# Patient Record
Sex: Female | Born: 1935 | Race: White | Hispanic: No
Health system: Midwestern US, Community
[De-identification: ages and names within clinical notes are randomized; demographics above are authoritative.]

## PROBLEM LIST (undated history)

## (undated) DIAGNOSIS — R296 Repeated falls: Secondary | ICD-10-CM

## (undated) DIAGNOSIS — E785 Hyperlipidemia, unspecified: Secondary | ICD-10-CM

## (undated) DIAGNOSIS — T4145XA Adverse effect of unspecified anesthetic, initial encounter: Secondary | ICD-10-CM

## (undated) DIAGNOSIS — M8580 Other specified disorders of bone density and structure, unspecified site: Secondary | ICD-10-CM

## (undated) DIAGNOSIS — R112 Nausea with vomiting, unspecified: Secondary | ICD-10-CM

## (undated) DIAGNOSIS — E559 Vitamin D deficiency, unspecified: Secondary | ICD-10-CM

## (undated) DIAGNOSIS — E119 Type 2 diabetes mellitus without complications: Secondary | ICD-10-CM

## (undated) DIAGNOSIS — E7800 Pure hypercholesterolemia, unspecified: Secondary | ICD-10-CM

## (undated) DIAGNOSIS — T8859XA Other complications of anesthesia, initial encounter: Secondary | ICD-10-CM

## (undated) DIAGNOSIS — Z9889 Other specified postprocedural states: Secondary | ICD-10-CM

## (undated) DIAGNOSIS — I2510 Atherosclerotic heart disease of native coronary artery without angina pectoris: Secondary | ICD-10-CM

## (undated) DIAGNOSIS — K5790 Diverticulosis of intestine, part unspecified, without perforation or abscess without bleeding: Secondary | ICD-10-CM

## (undated) DIAGNOSIS — R609 Edema, unspecified: Secondary | ICD-10-CM

## (undated) DIAGNOSIS — M810 Age-related osteoporosis without current pathological fracture: Secondary | ICD-10-CM

## (undated) DIAGNOSIS — M5431 Sciatica, right side: Secondary | ICD-10-CM

## (undated) DIAGNOSIS — Z1231 Encounter for screening mammogram for malignant neoplasm of breast: Secondary | ICD-10-CM

## (undated) DIAGNOSIS — R600 Localized edema: Secondary | ICD-10-CM

## (undated) DIAGNOSIS — R410 Disorientation, unspecified: Secondary | ICD-10-CM

## (undated) DIAGNOSIS — R270 Ataxia, unspecified: Secondary | ICD-10-CM

## (undated) DIAGNOSIS — I82542 Chronic embolism and thrombosis of left tibial vein: Secondary | ICD-10-CM

## (undated) DIAGNOSIS — M0609 Rheumatoid arthritis without rheumatoid factor, multiple sites: Secondary | ICD-10-CM

## (undated) DIAGNOSIS — M25551 Pain in right hip: Secondary | ICD-10-CM

## (undated) DIAGNOSIS — R509 Fever, unspecified: Secondary | ICD-10-CM

## (undated) HISTORY — DX: Hyperlipidemia, unspecified: E78.5

## (undated) HISTORY — PX: OTHER SURGICAL HISTORY: SHX169

## (undated) HISTORY — DX: Pure hypercholesterolemia, unspecified: E78.00

## (undated) HISTORY — PX: APPENDECTOMY: SHX54

## (undated) HISTORY — DX: Diverticulosis of intestine, part unspecified, without perforation or abscess without bleeding: K57.90

## (undated) HISTORY — DX: Vitamin D deficiency, unspecified: E55.9

## (undated) HISTORY — DX: Atherosclerotic heart disease of native coronary artery without angina pectoris: I25.10

## (undated) HISTORY — DX: Other specified disorders of bone density and structure, unspecified site: M85.80

## (undated) HISTORY — DX: Type 2 diabetes mellitus without complications: E11.9

## (undated) HISTORY — PX: CORONARY ARTERY BYPASS GRAFT: SHX141

## (undated) SURGERY — HEMIARTHROPLASTY, HIP, DIRECT ANTERIOR APPROACH, FOR FRACTURE
Anesthesia: General | Laterality: Right

---

## 1998-04-16 NOTE — Op Note (Signed)
[facility]                                OPERATION REPORT   NAME:     [name] [name], [name] K.   MR#:      [id number]                          DATE:   SS#                                         PT. LOCATION:   LOUIS CARIDEO, [name]   cc:   LOUIS CARIDEO, [name]   INDICATIONS:   Screening procedure.   PROCEDURE: REPORT:   The flexible sigmoidoscope was advanced  to  55  cm. The prep was less than   optimal, but with lavage we were able  to clean it out so that we were well   visualized up to about 55. It was not 100%; I would say, 90% visualization.   There  were  scattered  diverticula above  30  cm,  but  she  had  internal   hemorrhoids noted in the canal which  were  abraded but not bleeding. There   were  no  external  hemorrhoids.  There   were  no  polyps,  tumor  masses,   telangiectasias,  angiodysplasias or  pseudomembranes.  She  tolerated  the   procedure well.   DIAGNOSTIC IMPRESSION:   1.  Diverticulosis.   2.  Internal hemorrhoids.   3.  Otherwise normal flexible sigmoid to 55 cm.   ___________________________   LOUIS CARIDEO, [name]   vmt  D:    T:  04/28/1998  6:59 A

## 1998-04-28 NOTE — Op Note (Signed)
[facility]                                OPERATION REPORT   NAME:     ADELI, FROST   MR#:      [id number]                          DATE:   SS#                                         PT. LOCATION:   Gracy Racer, [name]   cc:   Gracy Racer, [name]   INDICATIONS:   Screening procedure.   PROCEDURE: REPORT:   The flexible sigmoidoscope was advanced  to  55  cm. The prep was less than   optimal, but with lavage we were able  to clean it out so that we were well   visualized up to about 55. It was not 100%; I would say, 90% visualization.   There  were  scattered  diverticula above  30  cm,  but  she  had  internal   hemorrhoids noted in the canal which  were  abraded but not bleeding. There   were  no  external  hemorrhoids.  There   were  no  polyps,  tumor  masses,   telangiectasias,  angiodysplasias or  pseudomembranes.  She  tolerated  the   procedure well.   DIAGNOSTIC IMPRESSION:   1.  Diverticulosis.   2.  Internal hemorrhoids.   3.  Otherwise normal flexible sigmoid to 55 cm.   ___________________________   Gracy Racer, [name]   vmt  D:    T:  04/28/1998  6:59 A

## 1998-10-17 ENCOUNTER — Inpatient Hospital Stay (HOSPITAL_COMMUNITY): Admission: EM | Admit: 1998-10-17 | Discharge: 1998-10-31 | Payer: Self-pay | Admitting: Emergency Medicine

## 1998-10-17 ENCOUNTER — Encounter: Payer: Self-pay | Admitting: Emergency Medicine

## 1998-10-20 ENCOUNTER — Encounter: Payer: Self-pay | Admitting: Interventional Cardiology

## 1998-10-21 ENCOUNTER — Encounter: Payer: Self-pay | Admitting: Interventional Cardiology

## 1998-10-21 ENCOUNTER — Encounter: Payer: Self-pay | Admitting: Surgery

## 1998-10-22 ENCOUNTER — Encounter: Payer: Self-pay | Admitting: Surgery

## 1998-10-24 ENCOUNTER — Encounter: Payer: Self-pay | Admitting: Surgery

## 1998-10-26 ENCOUNTER — Encounter: Payer: Self-pay | Admitting: Surgery

## 1998-12-31 ENCOUNTER — Encounter (HOSPITAL_COMMUNITY): Admission: RE | Admit: 1998-12-31 | Discharge: 1999-03-31 | Payer: Self-pay | Admitting: Interventional Cardiology

## 1999-01-17 ENCOUNTER — Encounter: Payer: Self-pay | Admitting: Family Medicine

## 1999-01-17 ENCOUNTER — Encounter: Admission: RE | Admit: 1999-01-17 | Discharge: 1999-01-17 | Payer: Self-pay | Admitting: Family Medicine

## 2000-09-01 ENCOUNTER — Other Ambulatory Visit: Admission: RE | Admit: 2000-09-01 | Discharge: 2000-09-01 | Payer: Self-pay | Admitting: Family Medicine

## 2000-10-18 ENCOUNTER — Encounter: Admission: RE | Admit: 2000-10-18 | Discharge: 2000-10-18 | Payer: Self-pay | Admitting: Family Medicine

## 2000-10-18 ENCOUNTER — Encounter: Payer: Self-pay | Admitting: Family Medicine

## 2001-01-21 ENCOUNTER — Encounter: Admission: RE | Admit: 2001-01-21 | Discharge: 2001-01-21 | Payer: Self-pay | Admitting: Family Medicine

## 2001-01-21 ENCOUNTER — Encounter: Payer: Self-pay | Admitting: Family Medicine

## 2002-07-06 NOTE — Op Note (Signed)
[facility]                                OPERATION REPORT   [name] [name], [name] [name]   E:   MR  [id number]                         DATE:            07/06/2002   #:   [name]  [id number]                      PT. LOCATION:    [id number]   #   Raynelle Jan, [name], [name]          Raynelle Jan, [name]   PROCEDURE:  Colonoscopy into the terminal ileum.   INDICATIONS:  A 69 year old, white female fro screening colonoscopy.   INSTRUMENT: Olympus CF-160L video colonoscope.   OPERATOR:  Dr. Adelene Idler.   ASSISTANT:  Lolita Acompanado.   PREMEDICATION:  Demerol 50 mg, Versed 2.5 mg intravenously.   DESCRIPTION OF PROCEDURE:  The Olympus video colonoscope was introduced   into the rectum and was advanced into the cecum without difficulty.  There   was clear visualization of the cecal base and ileocecal valve area.  The   scope was passed through the ileocecal valve into the terminal ileum for a   distance of 10 to 15 cm.   FINDINGS:  Several diverticula were noted throughout the sigmoid colon.   There was no endoscopic evidence of diverticulitis.  Otherwise the entire   colon and visualized portions of the terminal ileum appeared normal with   normal mucosa, normal vascular pattern and without evidence of   inflammation, ulceration, masses, polyps or other abnormalities.  Prominent   hemorrhoids were observed by retroflexing the scope in the rectum.  At this   point, the scope was withdrawn and the patient tolerated the procedure well   and was returned to the outpatient area in satisfactory condition.   IMPRESSION:   1.  Large internal hemorrhoids.   2.  Diverticulosis of the sigmoid colon.   3.  Otherwise normal colonoscopy with normal terminal ileum.   Electronically Signed By:   Raynelle Jan, [name] 07/13/2002 14:32   _________________________________   Raynelle Jan, [name]   le  D:  07/06/2002  T:  07/10/2002  3:13 [id number]

## 2002-07-06 NOTE — Op Note (Signed)
[facility]                                OPERATION REPORT   NAM [name] [name], [name] [name]   E:   MR  [id number]                         DATE:            07/06/2002   #:   SS  [id number]                      PT. LOCATION:    [id number]   #   ALAN J. GAMSEY, [name]   cc:    LOUIS CARIDEO, [name]          ALAN J. GAMSEY, [name]   PROCEDURE:  Colonoscopy into the terminal ileum.   INDICATIONS:  A 69-year-old, white female fro screening colonoscopy.   INSTRUMENT: Olympus CF-160L video colonoscope.   OPERATOR:  Dr. Gamsey.   ASSISTANT:  Lolita Acompanado.   PREMEDICATION:  Demerol 50 mg, Versed 2.5 mg intravenously.   DESCRIPTION OF PROCEDURE:  The Olympus video colonoscope was introduced   into the rectum and was advanced into the cecum without difficulty.  There   was clear visualization of the cecal base and ileocecal valve area.  The   scope was passed through the ileocecal valve into the terminal ileum for a   distance of 10 to 15 cm.   FINDINGS:  Several diverticula were noted throughout the sigmoid colon.   There was no endoscopic evidence of diverticulitis.  Otherwise the entire   colon and visualized portions of the terminal ileum appeared normal with   normal mucosa, normal vascular pattern and without evidence of   inflammation, ulceration, masses, polyps or other abnormalities.  Prominent   hemorrhoids were observed by retroflexing the scope in the rectum.  At this   point, the scope was withdrawn and the patient tolerated the procedure well   and was returned to the outpatient area in satisfactory condition.   IMPRESSION:   1.  Large internal hemorrhoids.   2.  Diverticulosis of the sigmoid colon.   3.  Otherwise normal colonoscopy with normal terminal ileum.   Electronically Signed By:   ALAN J. GAMSEY, [name] 07/13/2002 14:32   _________________________________   ALAN J. GAMSEY, [name]   le  D:  07/06/2002  T:  07/10/2002  3:13 [id number]

## 2003-02-14 NOTE — H&P (Signed)
[facility]                              HISTORY AND PHYSICAL                               Gracy Racer, [name]   NAME:    [name] [name], [name] [name]   MR #:    [id number]                    ADM DATE:        02/14/2003   BILLING  [id number]                   PT. LOCATION     [id number]   #:   SS #     [id number]   Gracy Racer, [name], [name]   CHIEF COMPLAINT:    I'm blocked.   HISTORY OF PRESENT ILLNESS:   This very pleasant 69 year old white female   is admitted to the hospital with 6 days of inability to move her bowels.   Her abdomen is bloated. She has nauseated feeling.  She has lower abdominal   cramps.  In my office, she had lower abdominal tenderness and a rectal   vault that was clean without stool.  Although not vomiting and not having   diarrhea, it was my impression that she had at least a partial small bowel   obstruction and she was admitted to the hospital for IV fluids, surgical   and gastrointestinal consultation.   PAST MEDICAL HISTORY:      1. Hypertension.      2. Hypothyroidism.      3. Radioactive iodine thyroid ablation.      4. Hemochromatosis, treated with phlebotomy.   SOCIAL HISTORY:   She does not smoke, she does drink wine occasionally.   FAMILY HISTORY:    Mother and father are deceased, cause of their deaths   not clear.   REVIEW OF SYSTEMS:   CONSTITUTIONAL:  Reveals stable weight.   PULMONARY:  Reveals no wheezing or coughing.   CARDIAC:  Reveals no chest pain.   GENITOURINARY:  Reveals no dysuria or hematuria.   ALLERGIES:   Sulfa   PHYSICAL EXAMINATION:   GENERAL:  Shows a white female who is pleasant, conversant, complaining of   lower abdominal cramping.   VITAL SIGNS:  Blood pressure 130/80, pulse 70, respiratory rate 16.   HEAD AND EYES:  Pupils equal, round, reactive to light and accommodation.   Conjunctiva pink.  Lids flat.  Throat not injected.   NECK:  Symmetric, supple, thyroid not enlarged or tender.  Trachea   midline.    LUNGS:  Clear to auscultation and percussion.   CARDIAC:  Shows PMI nondisplaced.  There is a midsystolic murmur   crescendo/decrescendo, grade 2/6 at the aortic area and left sternal   border.   BREASTS:  Without mass or discharge.   ABDOMEN:  Protuberant, somewhat tympanic with lower abdominal tenderness.   No organomegaly.  Bowel sounds diminished.   RECTAL:  Hematest negative.  Stool is not present.  There is no stool in   the rectal vault at all.   EXTREMITIES:  No clubbing, cyanosis or edema.   SKIN:  Warm and dry without rashes or nodules.   NEUROLOGICAL:  Exam shows she  moves all extremities, symmetrical reflexes,   normal sensation.   DIAGNOSTIC IMPRESSION:      1. Abdominal pain and bowel obstruction.      2. Hemochromatosis.      3. Hypertension.      4. Hypothyroidism.   ____________________________   Gracy Racer, [name]   Italica.Scarce  D:  02/14/2003  T:  02/14/2003  5:48 P   [id number]

## 2003-02-15 NOTE — Consults (Signed)
[facility]                               CONSULTATION REPORT                       CONSULTANT:  Robby Sermon, [name]   NAME:       [name] [name]   BILLING #:  [id number]             DATE OF CONSULT:     02/15/2003   MR #:       [id number]              ADM DATE:            02/14/2003   SS #        [id number]           PT. LOCATION:        [id number]   ATTENDING:  Gracy Racer, [name], [name]          Robby Sermon, [name]   REASON FOR CONSULTATION:  Acute obstipation and constipation.   HISTORY OF PRESENT ILLNESS:  [name] [name] is a 69 year old white woman well   known to Dr. Roma Schanz for underlying genetic hemochromatosis.  She was   in her usual state of health until about Wednesday of last week when she   began to experience fatigue, general body aching and malaise and a fever   sensation.  She felt like she was coming down with the flu.  Last Saturday,   she began to experience severe bouts of urgency, tenesmus and frequency   with passage of pencil thin stools.  She had to use enemas and laxatives   with minimal relief.  She states her last normal bowel movement was 8 days   ago.  Since then she has been complaining of rather severe bloating and low   abdominal colicky cramping and pain.  She denies any recent stress.  She   denies any recent trauma.  She denies vomiting or nausea.   The patient saw Dr. Billy Coast yesterday.  Dr. Billy Coast suspect a bowel   obstruction.  She was admitted.  Initial x-rays showed a nonspecific gas   pattern.  The patient was seen by Dr. Metro Kung who ordered repeat   x-rays, that showed improvement of gas pattern.  A nasogastric tube was   placed for decompression.  A GI evaluation has been requested by Dr.   Billy Coast.   PAST MEDICAL HISTORY:  Genetic hemochromatosis status post liver biopsy   August 2004.  The patient requiring weekly phlebotomy.  History of graves    disease status post thyroidectomy with radioactive iodine.   PAST SURGICAL HISTORY:  Total abdominal hysterectomy, bilateral   salpingo-oophorectomy, Cesarean section x3, kidney stone removal.   FAMILY HISTORY:  Mother died at age 21 of renal failure.  Father died at   age 86 of lung cancer.  No family history of liver disease or   hemochromatosis.  No family history of colon cancer or stomach cancer.   PERSONAL, SOCIAL HISTORY:  The patient is a homemaker.  She is married.   She has 3 children.  She has 6 grandchildren.  She does not smoke   cigarettes or abuse alcohol.   ALLERGIES:  Sulfa.   MEDICATIONS:  Synthroid, Zestril, Chloraseptic spray, Ativan, Ambien.  OBJECTIVE:  Well developed and well nourished.  She was awake, alert,   oriented.   VITAL SIGNS:  Temperature 99.7, pulse rate 80, blood pressure 127/76,   respirations 20.   HEAD, EYES, EARS, NOSE AND THROAT:  Anicteric sclerae and pink   conjunctivae.  Moist oral mucosa without thrush or telangiectasias.   NECK:  Supple without jugular venous distention or carotid bruits.   CHEST:  Clear to auscultation and percussion.   HEART:  Regular rhythm, no evidence of gallops, murmurs, rubs.   ABDOMEN:  Distended with rather hyperactive bowel sounds.  Multiple healed   abdominal scars.  Tympanitic all over.  No definite tenderness, rebound or   guarding.  No Murphy's sign.  No hernias.   RECTAL EXAMINATION:  Empty rectal vault.  No palpable mass.  Stool brown.   Good sphincter tone.   EXTREMITIES:  No clubbing, cyanosis, or edema.   PERTINENT LABORATORY RESULTS:      1. Sodium 142, potassium 3.9, BUN 7, creatinine 0.7, glucose 115, total      bilirubin 0.3, AST 19, ALT 30, alkaline phosphatase 69, amylase 26.      2. Hemoglobin 9.2, hematocrit 26.5, WBC 7.7, platelet count 419, MCV      19.9.   IMPRESSION:      1. Acute constipation and obstipation with accompanying bloating.  Plain      abdominal x-rays unremarkable for a bowel obstruction.  Rectal       examination without mass nor fecal impaction.  I suspect either the      possibility of an atypical small bowel obstruction or large bowel      obstruction, high fecal impaction, colonic stricture or functional bowel      disease.      2. Sigmoid diverticulosis.      3. Hemochromatosis requiring weekly phlebotomy.      4. Hypothyroidism.      5. Hypertension.      6. Anemia probably related to weekly phlebotomy.   PLAN:      1. Schedule a CAT scan of the abdomen and pelvis with contrast.      2. Maintain nasogastric tube suction.      3. Administer tap water enemas every morning.      4. Obtain a serum TSH level.      5. If CAT scan negative, suggest either flexible sigmoidoscopy combined      with single contrast barium enema versus a repeat colonoscopy.   Electronically Signed By:   Robby Sermon, [name] 02/21/2003 17:05   _________________________________   Robby Sermon, [name]   Xaver.Mink  D:  02/15/2003  T:  02/16/2003  3:16 P   [id number]

## 2003-02-18 NOTE — Consults (Signed)
[facility]                               CONSULTATION REPORT                       CONSULTANT:  Donah Driver, [name]   NAME:       [name] [name]   BILLING #:  [id number]             DATE OF CONSULT:     02/18/2003   MR #:       [id number]              ADM DATE:            02/14/2003   SS #        [id number]           PT. LOCATION:        [id number]   ATTENDING:  Gracy Racer, [name], [name], [name]   REASON FOR CONSULTATION:  Gross hematuria.   NARRATIVE SUMMARY:  This is a pleasant 69 year old white female admitted   with complicated sigmoid diverticulosis with abscess.  After appropriate IV   antibiotics were started the patient acutely has had episode of urinary or   vaginal bleeding today.  A Foley catheter was placed and the patient had   grossly clear urine.  The patient is status post total abdominal   hysterectomy and bilateral salpingo-oophorectomy in the remote past.  The   patient however, continues to have bleeding and this appears to be   vaginally.   The patient's past medical history is pertinent for hypertension,   hyperthyroidism,  mammograms and pap smears have been normal, she had a pap   smear a year and a half ago.  She had pyelolithotomy in 1955, hysterectomy   in 1961, thyroidectomy in 1956.  She has had 3 cesarean sections and 1   breast biopsy that was negative.   She is allergic to sulfa.   Her medications include Synthroid, hydrochlorothiazide, lisinopril, calcium   citrate, glucosamine and chondroitin, Tylenol.   SOCIAL HISTORY, FAMILY HISTORY, REVIEW OF SYSTEMS:  Were noted as per   dictated history and physical from  Dr. Billy Coast and consult patient notes.   PHYSICAL EXAMINATION:   GENERAL:  She has no costovertebral angle tenderness.   VITAL SIGNS:  She is febrile at 101 degrees Fahrenheit.   ABDOMEN:  Soft, nontender, there is slight periumbilical pain but nothing   to palpation.    GYN:  There is normal urethra with Foley catheter in place and no bleeding   around the urethra.  There is thin blood in the vagina.  There is a   pronounced vaginal cuff, I question if this is inflammation as it has been   so long since her hysterectomy.   EXTREMITIES:  Without edema or tenderness.   The urine is grossly clear.  Urinalysis shows 5 to 9 RBC per high power   field with negative leukocyte esterase and negative on micro.  Sodium 140,   potassium 3.9, creatinine 0.7, hemoglobin, hematocrit 10 and 30, white   blood count 10.3.   ASSESSMENT:  Pelvic bleeding, appears vaginal in nature but with a   diverticular history, certainly need to rule out potential colovesical   fistula  or colovaginal fistula.   PLAN:  I agree with repeat CT and CT cystogram can be obtained at the same   time.  Also ? GYN consult if there is no fistula documented.  Certainly the   patient will benefit from cystoscopy when she is clinically improved.   Right now she appears febrile and does have further issues.   Electronically Signed By:   Donah Driver, [name] 03/08/2003 18:47   _________________________________   Donah Driver, [name]   Davonna Belling  D:  02/18/2003  T:  02/19/2003  2:04 P   [id number]

## 2003-02-19 NOTE — Consults (Signed)
[facility]                               CONSULTATION REPORT                        CONSULTANT:  Milinda Antis, [name]   NAME:       [name] [name]   BILLING #:  [id number]             DATE OF CONSULT:     02/19/2003   MR #:       [id number]              ADM DATE:            02/14/2003   SS #        [id number]           PT. LOCATION:        [id number]   ATTENDING:  Gracy Racer, [name], [name]          Milinda Antis, [name]   HISTORY OF PRESENT ILLNESS:  This is a 69 year old white female gravida 4,   para 3, 013, last menstrual period with total abdominal hysterectomy at 69   years of age, which is approximately 30 years ago for questionable   abnormality of her cervix for what the patient states was some type of   cancer or precancer.  The patient did not have radiation or chemotherapy   following the surgery.  The patient has a history of spotting for the past   2 days while admitted in the hospital for constipation and acute   diverticulitis.  The patient also states when she was bleeding yesterday   she noticed a clot came out.  Today she is doing better.  A total of 5 pads   used in the past 2 days.  She has been admitted for approximately 5 days at   this point for constipation and acute possible diverticular abscess.   PAST MEDICAL HISTORY:  Significant for hemochromatosis where she had blood   drawn off prior to admission, hemoglobin running at 9 to 10.  The patient   also has a history of increased blood pressure.   PAST SURGICAL HISTORY:  Total abdominal hysterectomy, bilateral   salpingo-oophorectomy with the ovaries being removed on 2 occasions.   Thyroid surgery x 2 with also radioactive thyroid being used.  Kidney stone   history, cesarean section x 3.   PAST GYN HISTORY:  Cesarean section x 3, negative hormone replacement   therapy x 3 years.   SOCIAL HISTORY:   She is married, retired Chartered loss adjuster.  Denies smoking,   denies excess alcohol.    ALLERGIES:  Sulfa.   MEDICATIONS:  Currently are Synthroid and lisinopril.   FAMILY HISTORY:   Lung cancer.   REVIEW OF SYSTEMS:  Significant for abnormal Pap smears, status post 30   years and no hormones for the past 3 years.   PHYSICAL EXAMINATION:  Confined to the GYN area.  The vagina appears   grossly normal.  The external genitalia - BUS appears normal.  Introitus at   the vaginal opening has the appearance of atrophy.  The vaginal mucosa   itself is also atrophic.  A speculum was placed and extended into the   vagina.  It was noted the apex of the vagina  appeared to be drawn up by   traction, could not see any definitive lesions at the apex.  A swab had   some slight minimal pinkish tint to it after wiping the area.  A Pap smear   was done to assure there was no obvious cervical cytology of the cuff.   Bimanual examination shows the apex of the vagina is firmly attached in the   pelvis.  Questionably this could be secondary to the diverticular disease   versus some other etiology.   ASSESSMENT:      1. Post menopausal bleeding.      2. Atrophic vaginitis.      3. 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   PLAN:  Pap done.  The patient states she does have a regular gynecologist   she sees and she is planning to see him within the next few days upon   discharge.  She can either continue with her standard gynecologist or   follow up with myself post admission.  We will follow her for the next day   or 2 to assure there is no further bleeding.  If she continues to be in the   hospital then we will try to get records concerning the hysterectomy at age   89.   Thank you very much for the opportunity to do a consultation on this   extremely pleasant woman.   Electronically Signed By:   Milinda Antis, [name] 02/21/2003 10:18   _________________________________    Milinda Antis, [name]   Keltoi.Punches  D:  02/19/2003  T:  02/20/2003  7:14 A   [id number]

## 2003-02-20 NOTE — Procedures (Signed)
[facility]                              CARDIOLOGY DEPARTMENT                              ECHOCARDIOGRAM REPORT   Name: [name] [name], [name] [name]                     Location: 4225        Age: 69   Date: 02/20/2003   Ref Phys: L. Carideo                         MR#: [id number]   Tape/Index:   Reason For Study: Left ventricular ejection fraction; murmur   M-Mode Measurements (Parasternal):                Measured:   Normal:   RV Internal Diastolic Diameter              7 mm                    7-23 mm   Ventricular Septal Thickness                9 mm                    7-11 mm   LV Internal Diastolic Diameter             48 mm                   37-54 mm   LV Posterior Wall Thickness                10 mm                    7-11 mm   LV Internal Systolic Diameter              29 mm   Aortic Root Diameter                       33 mm                   20-37 mm   Aortic Valve Cusp Separation               20 mm                   16-26 mm   Left Atrial Diameter                       31 mm                   19-40 mm   2-Dimensional and Doppler Findings:   INDICATION:  Evaluate cardiac source of murmur.   STUDIES PERFORMED:  2-D surface echocardiogram with qualitative and   quantitative Doppler and M-mode measurements.   FINDINGS:   1.  Technical quality of study is adequate.   2.  Left ventricle appears normal in size and function with normal regional       wall motion.    Ejection fraction is approximately 55-60%.  Right       ventricle appears normal in size and function.  Right and left  atrium       appear normal in size.   3.  Aortic valve is trileaflet with normal leaflet excursion and no       evidence of stenosis or regurgitation.   4.  Mitral valve appears structurally normal.  No evidence of stenosis.       There is trace regurgitation.  There is equalization of e/a wave ratios       with abnormal Doppler tissue imaging consistent with diastolic       dysfunction.   5.  Tricuspid valve  has trace regurgitation with right ventricular systolic       pressure of 34 mmHg.   6.  Pulmonic valve is not well seen.   7.  There is no pericardial effusion.   8.  There is no intracardiac mass, shunt, thrombi or vegetation.   IMPRESSION:  Normal left ventricular systolic function with evidence of   diastolic dysfunction.  Otherwise as above.   __________________________________________________   Roque Cash, [name]   Will.Benes  D: 02/20/2003  T: 02/20/2003  5:36 P    [id number]

## 2003-02-20 NOTE — Discharge Summary (Signed)
[facility]                                DISCHARGE SUMMARY   NAM AKANKSHA, BELLMORE   E:   MR  [id number]                          ADM DATE:      02/14/2003   #:   [name]  [id number]                       DIS DATE:      02/20/2003   #   Gracy Racer, [name], [name]   SUMMARY OF HISTORY:  This 69 year old white female was admitted to the   hospital with six days of inability to move her bowels, bloating, nausea,   and lower abdominal pain.   PAST MEDICAL HISTORY:   1.     Hypertension.   2.     Hypothyroidism.   3.     Radioactive iodine thyroid ablation.   4.     Hemochromatosis, treated with phlebotomy.   PHYSICAL EXAMINATION:  See History of the Present Illness.   HOSPITAL COURSE AND COMPLICATIONS:  Diverticulitis.  This patient was   admitted to the hospital with abdominal pain.  She was seen by   Gastroenterology and by Surgery.  She had a CAT scan which suggested a   diverticular abscess.  This was treated with IV antibiotics, specifically   Levaquin and metronidazole.  These were given IV.  The pain improved.  It   was thought that she might need a diverticular resection in the future;   however, we decided to go ahead and give her a significant course of IV   antibiotics and stabilize her.  Will re-admit her for potential surgery at   a future time.  At the time of discharge.  Her pain has subsided.  She is   moving her bowels and felt better.   LABORATORY/DIAGNOSTIC DATA:  Labs showed sodium 142, potassium 3.9,   chloride 103, Co2 29, glucose 115, BUN 7, creatinine 0.7.  White count   10,312; repeat 5100.  Urinalysis showed negative leukocyte esterase,   moderate blood.  Urine culture showed no growth.  TSH was 5.4 which was   slightly high.  Initial hemoglobin 9.2.  Iron was 195, TIBC was 225,   838, folic acid 14.1.   DISCHARGE DIAGNOSES:   1.     Diverticulitis with abscess.   2.     Hypertension.   3.     Hypothyroidism.   4.     Hemochromatosis.    DISCHARGE MEDICATIONS:   1.     Levaquin 500 mg daily.   2.     Flagyl 500 mg IV q.6 hours to complete 14 days.   3.     Lisinopril 10 mg daily.   4.     Synthroid 0.05 mg daily.   ________________________________   Gracy Racer, [name]   [place].Birch  D:  03/12/2003  T:  03/14/2003  6:49 A   [id number]

## 2003-02-20 NOTE — Procedures (Signed)
[facility]                              CARDIOLOGY DEPARTMENT                              ECHOCARDIOGRAM REPORT   Name: [name] [name], [name] [name]                     Location: 4225        Age: 69   Date: 02/20/2003   Ref Phys: L. Carideo                         MR#: [id number]   Tape/Index:   Reason For Study: Left ventricular ejection fraction; murmur   M-Mode Measurements (Parasternal):                Measured:   Normal:   RV Internal Diastolic Diameter              7 mm                    7-23 mm   Ventricular Septal Thickness                9 mm                    7-11 mm   LV Internal Diastolic Diameter             48 mm                   37-54 mm   LV Posterior Wall Thickness                10 mm                    7-11 mm   LV Internal Systolic Diameter              29 mm   Aortic Root Diameter                       33 mm                   20-37 mm   Aortic Valve Cusp Separation               20 mm                   16-26 mm   Left Atrial Diameter                       31 mm                   19-40 mm   2-Dimensional and Doppler Findings:   INDICATION:  Evaluate cardiac source of murmur.   STUDIES PERFORMED:  2-D surface echocardiogram with qualitative and   quantitative Doppler and M-mode measurements.   FINDINGS:   1.  Technical quality of study is adequate.   2.  Left ventricle appears normal in size and function with normal regional       wall motion.    Ejection fraction is approximately 55-60%.  Right       ventricle appears normal in size and function.  Right and left  atrium       appear normal in size.   3.  Aortic valve is trileaflet with normal leaflet excursion and no       evidence of stenosis or regurgitation.   4.  Mitral valve appears structurally normal.  No evidence of stenosis.       There is trace regurgitation.  There is equalization of e/a wave ratios       with abnormal Doppler tissue imaging consistent with diastolic       dysfunction.    5.  Tricuspid valve has trace regurgitation with right ventricular systolic       pressure of 34 mmHg.   6.  Pulmonic valve is not well seen.   7.  There is no pericardial effusion.   8.  There is no intracardiac mass, shunt, thrombi or vegetation.   IMPRESSION:  Normal left ventricular systolic function with evidence of   diastolic dysfunction.  Otherwise as above.   __________________________________________________   Roque Cash, [name]   Will.Benes  D: 02/20/2003  T: 02/20/2003  5:36 P    [id number]

## 2003-04-17 NOTE — H&P (Signed)
[facility]                              HISTORY AND PHYSICAL                               Gracy Racer, [name]   NAME:    [name] [name], [name] [name]   MR #:    [id number]                    ADM DATE:        04/17/2003   BILLING  [id number]                   PT. LOCATION     [id number]   #:   SS #     [id number]   Gracy Racer, [name], [name]   CHIEF COMPLAINT:   Abdominal pain.   HISTORY:  This is a 70 year old white female who had previously been   admitted in December 2004 with diverticulitis.  She was treated   nonoperatively and was discharged with antibiotics.  She was afebrile until   the 29th of January when she had onset of lower abdominal pain.  The   following day she noted some vaginal discharge.  She indicated that her   stool size had decreased, but she had no rectal bleeding.  She was seen by   Dr. Billy Coast, who admitted her on the 1st of February.   PAST MEDICAL HISTORY:  Included previous diverticulitis admission.  She had   a hysterectomy approximately 30 years previous and prior to that had   cesarean sections.   REVIEW OF SYSTEMS:  Noncontributory.   PHYSICAL EXAMINATION:  The patient is alert, oriented and cooperative.   HEENT:  Head is normocephalic, atraumatic.  Eyes pupils equal and respond   to light and accommodation.  Extraocular muscles are intact.  Throat   unremarkable.   NECK:  Supple.  Trachea is midline.  No lymphadenopathy.   CHEST:  Expansion symmetrical.   LUNGS:  Clear to auscultation and percussion.   HEART:  Rate are normal.   ABDOMEN:  Scaphoid.  Some tenderness in the left lower quadrant with no   masses palpable, no rebound.   LABORATORY DATA:  The patient's white count was 9,500 with 50% segs.   Glucose was 150, CO2 31, BUN 3.4.  Other chem values were normal.  CT   suggested sterile abscess in that there was no sign of local inflammation   with fluid accumulation.  The sigmoid is apparently adhesions to the    posterior bladder and to the vaginal cuff.   IMPRESSION:  Residua of diverticulitis.   RECOMMENDATIONS:  Elective sigmoid resection.  Arrange assistance with Dr.   Iran Ouch and Dr. Marden Noble on the 11th of February.   Thanks for the opportunity to see this patient.   Yours sincerely,   ____________________________   Gracy Racer, [name]   md  D:  04/17/2003  T:  04/19/2003  9:55 A   [id number]

## 2003-04-17 NOTE — H&P (Signed)
[facility]                              HISTORY AND PHYSICAL                               Gracy Racer, [name]   NAME:    [name] [name], [name] [name]   MR #:    [id number]                    ADM DATE:        04/17/2003   BILLING  [id number]                   PT. LOCATION     [id number]   #:   SS #     [id number]   Gracy Racer, [name], [name]   CHIEF COMPLAINT:  "My belly hurts."   HISTORY OF PRESENT ILLNESS:   I saw this woman in my office with lower   abdominal cramping, smaller BMs and what she described as mild vaginal   bleeding.  This has been going on for 4 days.  She was discharged from this   hospital on 12/07 after a 6-day hospitalization for complicated   diverticulitis.  She was treated with a prolonged period of antibiotics and   had a CAT scan which showed a diverticular abscess.  She was specifically   treated with Levaquin and metronidazole intravenously and then Augmentin as   an outpatient.  I saw her in the office previous to this recent visit and   she was doing well with no symptomatology.   PAST HISTORY:      1. Hypertension.      2. Hypothyroidism.      3. Radioactive iodine ablation of her thyroid.      4. Hemochromatosis, treated with lobotomy.   SOCIAL HISTORY:  She does not smoke or drink.   ALLERGIES:  Sulfa.   FAMILY HISTORY: Mother and father are deceased.  The causes of the deaths   are not clear.   REVIEW OF SYSTEMS:   CONSTITUTIONAL: No weight gain or weight loss.   CARDIAC:  Review reveals no dyspnea on exertion and no edema.   PULMONARY:  Review reveals no wheezing or coughing.   GENITOURINARY:  No dysuria or hematuria.  She is having some vaginal   bleeding.  She was evaluated by Dr. Marden Noble during the last hospitalization.   She denies symptoms of other systems review.   PHYSICAL EXAMINATION on 04/17/03 shows a white female, pleasant,   conversant, complaining of lower abdominal cramping but not severe.    VITAL SIGNS:  Blood pressure is 140/80, pulse 80, respiratory rate 18 and   temperature is 98.  [name]   HEAD AND EYES:  Head is normocephalic.  Eyes - Pupils equal, round,   reactive to light and accommodation.  Conjunctivae pink.  Lids flat.   Throat not injected.   NECK:  Symmetric, supple, thyroid not enlarged or tender.  Trachea   midline.   LUNGS:  Clear to auscultation and percussion.   CARDIAC:  Shows PMI nondisplaced without gallop.  There is a   crescendo/decrescendo murmur, grade II/VI, at the aortic area and left   sternal border.   BREASTS:  Without mass or discharge.   ABDOMEN:  Protuberant.  There  is suprapubic tenderness which is moderate.   No rebound is noted.   RECTAL:  Hematest negative.  No stool present.   PELVIC:  Deferred.   EXTREMITIES:  Show no clubbing, cyanosis or edema.  Skin is warm and dry   without rashes or nodules.   NEUROLOGIC:  She moves all extremities.  She has symmetric reflexes and   normal sensation.   DIAGNOSTIC IMPRESSIONS:      1. Abdominal pain with previous diverticular abscess.      2. Hemochromatosis.      3. Vaginal bleeding.      4. Hypertension.      5. Hypothyroidism.   ____________________________   Gracy Racer, [name]   lo  D:  04/17/2003  T:  04/18/2003  7:24 A   [id number]

## 2003-04-18 NOTE — Consults (Signed)
[facility]                               CONSULTATION REPORT                        CONSULTANT:  Milinda Antis, [name]   NAME:       [name] [name]   BILLING #:  [id number]             DATE OF CONSULT:     04/18/2003   MR #:       [id number]              ADM DATE:            04/17/2003   SS #        [id number]           PT. LOCATION:        [id number]   ATTENDING:  Gracy Racer, [name], [name]          Milinda Antis, [name]          Dionicia Abler, [name]   HISTORY OF PRESENT ILLNESS:    A 70 year old white gravida 4, para 3, 0-1-3   admitted for abdominal pain with history of diverticulosis noted bleeding   on Sunday and status post 3 days, watery in nature with blood and also   spotting today.   PAST SURGICAL HISTORY:    Hysterectomy 33 years ago, question if there was   some type of a cervical dysplasia or other precancer lesions.   History of   thyroid surgery, BSO and cesarean section x 3.   PAST MEDICAL HISTORY:    Diverticulosis and thyroid disease also   hemochromatosis.   PAST GYN HISTORY:   Menarche at age 16.   Menopause with hysterectomy.   SOCIAL HISTORY:   Married, homemaker.    Denies smoking, drugs or alcohol.   REVIEW OF SYSTEMS:   Previous episode of this in December 2004 when she was   admitted to [facility] and was to followup at her PMD GYN   which she was unable to do so.   PHYSICAL EXAMINATION:    Confined to abdomen and GYN.   ABDOMEN:   Is soft, nontender.   Old midline scar noted.   GYN:   Vaginal examination is a firm area at the apex at the vagina.   Negative palpable adnexal masses.   No obvious blood on the glove.   ASSESSMENT:   Postmenopausal bleeding, possible diverticulosis at the apex   and the vaginal cuff her previous admission.   PLAN:   Obtain old records from previous GYN.   If Dr. Jarold Motto is   considering doing surgery, will be happy to scrub in to assess the aspect    of the cuff involvement.   Otherwise will continue to follow the patient.   Again, thank you for the consultation and it is a pleasure to work with   you.   Sincerely yours,   Electronically Signed By:   Milinda Antis, [name] 05/05/2003 11:43   _________________________________   Milinda Antis, [name]   eb  D:  04/18/2003  T:  04/18/2003  2:43 P   [id number]

## 2003-04-27 NOTE — Op Note (Signed)
[facility]                                OPERATION REPORT                       SURGEON:  Dionicia Abler, [name]   [facility] [place]   E:   MR  [id number]                         DATE:            04/27/2003   #:   [name] [name]  [id number]                      PT. LOCATION:    [id number]   #   Dionicia Abler, [name]   cc:    Dionicia Abler, [name]   PREOPERATIVE DIAGNOSIS:   Diverticulitis of the sigmoid.   POSTOPERATIVE DIAGNOSIS:   Diverticulitis of the sigmoid.   PROCEDURE:   Sigmoid resection and repair of bladder tear.   SURGEON:   Dr. Jarold Motto.   1ST ASSISTANT:   Dr. Iran Ouch.   DESCRIPTION OF PROCEDURE:  The patient was placed on the table in the   supine position after epidural had been inserted. General anesthesia was   administered. The patient then had compression stockings applied and the   legs were placed in extension stirrup. The abdomen was prepped and draped.   Vertical incision was made through a previous operative site. Adhesions to   the anterior abdominal wall were lysed. A wound protector was inserted and   the abdomen was examined. A segment of sigmoid measuring approximately   15-cm in length was indurated and adherent to the vaginal cuff and the   posterior aspect of the bladder. Packs and retractors were placed. The   white line of Toldt was incised left laterally and sigmoid rotated   medially. The left ureter was identified. The undersurface of the adherent   sigmoid loop was not visible so it was freed from the vaginal cuff and the   bladder with digital blunt dissection. After the loop had been freed and   brought into the operative area, it became apparent that the bladder had   been opened during the blunt dissection. This was repaired in three layers   using 3-0 chromic for the mucosa, then a double layer closure of the   muscular bladder using 2-0 chromic.   Attention was then turned to the sigmoid. The bowel was freed   circumferentially approximately 4-cm proximal to  the inflammatory mass. The   GIA was placed and fired transecting the bowel and sealing both ends. Right   angle clamps were placed distally and the bowel distal was transected. A   Ligasure was used to transect the mesosigmoid. The specimen was passed from   the field. The bowel ends could be approximated without tension. A   non-crushing bowel clamp was placed across the descending colon. Colotomy   was performed on the antimesenteric surface and a single layer anastomosis   using Vicryl was carried out. On the posterior surface end sutures and two   mattress sutures were placed and then the intervals were closed with single   layer simple sutures. The anterior was closed in similar manner and the   non-crushing bowel clamp was removed. There was  a single area of leakage,   which was closed with suture. Then fatty tissues were sutured across the   suture line.   The anastomosis was tension free and adequate by palpation. The bladder   repair was examined and appeared to be intact. The bowel was returned to   its normal position after irrigation. Sponge count was initially short one.   Re-examination detected a retained sponge in the left quadrant. This was   removed. Sponge count was correct. The peritoneum was closed with 0 general   closure, O Nurolon was used for fascial approximation, 3-0 chromic was used   for subcutaneous fat approximation and auto sutures were used for skin   closure. A sterile dressing was applied. The patient taken to recovery   having tolerated the procedure well.   Electronically Signed By:   Dionicia Abler, [name] 05/09/2003 09:04   _________________________________   Dionicia Abler, [name]   le  D:  04/27/2003  T:  04/29/2003  1:53 P   [id number]

## 2003-04-27 NOTE — Op Note (Signed)
[facility]                                OPERATION REPORT                       SURGEON:  Dionicia Abler, [name]   [facility] [place]   E:   MR  [id number]                         DATE:            04/27/2003   #:   [name] [name]  [id number]                      PT. LOCATION:    [id number]   #   Dionicia Abler, [name]   cc:    Dionicia Abler, [name]   PREOPERATIVE DIAGNOSIS:   Diverticulitis of the sigmoid.   POSTOPERATIVE DIAGNOSIS:   Diverticulitis of the sigmoid.   PROCEDURE:   Sigmoid resection and repair of bladder tear.   SURGEON:   Dr. Jarold Motto.   1ST ASSISTANT:   Dr. Iran Ouch.   DESCRIPTION OF PROCEDURE:  The patient was placed on the table in the   supine position after epidural had been inserted. General anesthesia was   administered. The patient then had compression stockings applied and the   legs were placed in extension stirrup. The abdomen was prepped and draped.   Vertical incision was made through a previous operative site. Adhesions to   the anterior abdominal wall were lysed. A wound protector was inserted and   the abdomen was examined. A segment of sigmoid measuring approximately   15-cm in length was indurated and adherent to the vaginal cuff and the   posterior aspect of the bladder. Packs and retractors were placed. The   white line of Toldt was incised left laterally and sigmoid rotated   medially. The left ureter was identified. The undersurface of the adherent   sigmoid loop was not visible so it was freed from the vaginal cuff and the   bladder with digital blunt dissection. After the loop had been freed and   brought into the operative area, it became apparent that the bladder had   been opened during the blunt dissection. This was repaired in three layers   using 3-0 chromic for the mucosa, then a double layer closure of the   muscular bladder using 2-0 chromic.   Attention was then turned to the sigmoid. The bowel was freed    circumferentially approximately 4-cm proximal to the inflammatory mass. The   GIA was placed and fired transecting the bowel and sealing both ends. Right   angle clamps were placed distally and the bowel distal was transected. A   Ligasure was used to transect the mesosigmoid. The specimen was passed from   the field. The bowel ends could be approximated without tension. A   non-crushing bowel clamp was placed across the descending colon. Colotomy   was performed on the antimesenteric surface and a single layer anastomosis   using Vicryl was carried out. On the posterior surface end sutures and two   mattress sutures were placed and then the intervals were closed with single   layer simple sutures. The anterior was closed in similar manner and the   non-crushing bowel clamp was removed. There was  a single area of leakage,   which was closed with suture. Then fatty tissues were sutured across the   suture line.   The anastomosis was tension free and adequate by palpation. The bladder   repair was examined and appeared to be intact. The bowel was returned to   its normal position after irrigation. Sponge count was initially short one.   Re-examination detected a retained sponge in the left quadrant. This was   removed. Sponge count was correct. The peritoneum was closed with 0 general   closure, O Nurolon was used for fascial approximation, 3-0 chromic was used   for subcutaneous fat approximation and auto sutures were used for skin   closure. A sterile dressing was applied. The patient taken to recovery   having tolerated the procedure well.   Electronically Signed By:   Dionicia Abler, [name] 05/09/2003 09:04   _________________________________   Dionicia Abler, [name]   le  D:  04/27/2003  T:  04/29/2003  1:53 P   [id number]

## 2003-04-28 NOTE — Consults (Signed)
[facility]                               CONSULTATION REPORT                        CONSULTANT:  Gracy Racer, [name]   NAME:       [name], [name]   BILLING #:  [id number]             DATE OF CONSULT:     04/28/2003   MR #:       [id number]              ADM DATE:            04/27/2003   SS #        [id number]           PT. LOCATION:        [id number]   ATTENDING:  Dionicia Abler, [name], [name]          Dionicia Abler, [name]   CHIEF COMPLAINT:   I had my surgery .   HISTORY OF PRESENT ILLNESS:  This 70 year old white female with 2 previous   admissions here for diverticulitis with a significant bowel abscess, has   had surgical evaluation and yesterday underwent resection of the bowel   segment in question.  Her illness had been complicated apparently by   adhesions to the bladder and vagina and she now has a catheter in place   with some bloody drainage and apparently required some repair of the bowel   adhesions and the bladder.  Currently she is walking, she still had some   abdominal tenderness but feeling well.  Medical consultation was requested   for perioperative management.  Note that she has an epidural catheter in   place for pain control.   PAST HISTORY:      1. Previous hysterectomy.      2. Two previous admissions for diverticulitis.      3. Hypothyroidism.      4. Hypertension.      5. Radioactive iodine ablation of her thyroid.      6. Hemachromatoses.   SOCIAL HISTORY:  Reveals no smoking or drinking.   ALLERGIES:  Sulfa.   FAMILY HISTORY:  Reveals mother and father are deceased, cause of death not   clear.   REVIEW OF SYSTEMS:CONSTITUTIONAL:  Stable weight.   CARDIAC EXAM:  Reveals no chest pain.   PHYSICAL EXAMINATION:  Shows a white female pleasant, conversant,   complaining of some abdominal discomfort.  She has a Foley catheter in   place and an epidural catheter in place.    VITAL SIGNS:  Blood pressure is 129/76, temperature 98, pulse 69,   respiratory rate 16.   HEAD, EARS, EYES, NOSE, THROAT:  Head is normocephalic.  Eyes show pupils   equal, round, reactive to light and accommodation. Conjunctivae pink.  Lids   flat.   NECK:  Symmetrical without thyromegaly or bruits.   LUNGS:  Clear to auscultation and percussion.   CARDIAC:  PMI nondisplaced without murmur or gallop.   BREASTS:  Not examined.   ABDOMEN:  Is protuberant with some lower abdominal tenderness which is   minimal.  Bowel sounds are normal.   RECTAL:  Not done.   PELVIC:  Not done.   EXTREMITIES:  Show no clubbing, cyanosis, or edema.   DIAGNOSTIC IMPRESSIONS:      1. Diverticulitis status post resection.      2. Adhesion of bowel to bladder.   PLAN:  In view of the patient's age and hypertension I am going to start   her on low dose heparin.  I discussed this with Dr. Humberto Seals from Anesthesia   and he is comfortable with the epidural in place as long as we do not use   Lovenox or high dose infusion heparin.  Observe her for any bleeding.   _________________________________   Gracy Racer, [name]   [place]ink  D:  04/28/2003  T:  04/29/2003  1:59 P   [id number]

## 2003-04-29 NOTE — Consults (Signed)
[facility]                               CONSULTATION REPORT                         CONSULTANT:  Mickel Fuchs, [name]   NAME:       [name] [name], [name] [name]   BILLING #:  [id number]             DATE OF CONSULT:   MR #:       [id number]              ADM DATE:            04/27/2003   SS #        [id number]           PT. LOCATION:        [id number]   ATTENDING:  Dionicia Abler, [name], [name]          Mickel Fuchs, [name]          Dionicia Abler, [name]   DIAGNOSIS:  Status post sigmoid resection for recurrent diverticulitis with   repair of cystostomy-April 27, 2003.   HISTORY:  This is a 70 year old with a history of recurrent diverticulitis.   She additionally has a history of apparent vaginal bleeding in December   2004.  At that time, she was seen by Dr. Deretha Emory.  A CT scan of the   abdomen and pelvis did not show any significant abnormality involving the   urinary tract.  She does have a remote history of urinary tract calculus.   Urologic history otherwise is not significant.   PAST SURGICAL HISTORY:  Hysterectomy.   REVIEW OF SYSTEMS:  Noncontributory.   CURRENT MEDICATIONS:  Medications in the hospital include Tylenol,   Benadryl, Toradol, morphine, Nubain, nalmefene, Zofran.   ALLERGIES:   Sulfa drugs.   PHYSICAL EXAMINATION:   CVA:  No tenderness.   ABDOMEN:  Status post surgical incision.  No masses.  Indwelling Foley   catheter.   Discussed with the patient.  She had a 3-layer closure of the cystostomy   done by Dr. Jarold Motto.  Recommend indwelling Foley catheter and at day 10 a   cystogram with removal of the Foley catheter if no extravasation.   Electronically Signed By:   Mickel Fuchs, [name] 05/08/2003 10:09   _________________________________   P. Genevie Cheshire, [name]   lo  D:  04/29/2003  T:  04/30/2003  9:39 A   [id number]

## 2003-06-11 NOTE — Discharge Summary (Signed)
[name] [name]                                DISCHARGE SUMMARY   [name] [name], [name] [name]   E:   MR  [id number]                          ADM DATE:      04/26/2002   #:   [name]  [id number]                       DIS DATE:      05/01/2002   #   Dionicia Abler, [name]   cc:    Dionicia Abler, [name]   CHIEF COMPLAINT:  Diverticular disease, recurring.   HISTORY:  [age]-year-old white female had 2 previous admissions for   diverticulitis.  She responded to medical treatment and was admitted for   elected resection.   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 She had a   previous hysterectomy, hemochromatosis, radioactive iodine for thyroid, and   hypertension.   ALLERGIES:  Sulfa.   PHYSICAL EXAMINATION:  Unremarkable except for the surgical absence of the   uterus.   DIAGNOSTIC TESTING:   The patient's electrolytes were normal.  Her   hemoglobin was 12.5 preoperative, 10.4 at discharge.  Urinalysis on   February 14 showed 5-9 white cells and 30-40 red cells. White cell count   was normal.   [name] COURSE:  The patient was admitted on 02/11 after having undergone   at home bowel prep.  She underwent sigmoid resection.  The sigmoid was   adherent to the bladder dome which was disrupted during the separation,   requiring a 3-layer bladder closure.  Postoperatively the bladder cleared   rapidly.  Urology consult was requested.  The patient had bowel activity.   She was started on liquids which were tolerated.  Diet was advanced.  She   was discharged on February 16 for outpatient followup with a bladder   catheter in place.   DISCHARGE DIAGNOSES:      1. Recurring diverticular disease.      2. The patient with previous hypothyroidism, hemochromatosis, and      hypertension.   OPERATIONS:  Sigmoid resection, repair of bladder.   DISCHARGE MEDICATIONS:  Synthroid and lisinopril.    FOLLOWUP:  In the office in 2 weeks.   Electronically Signed By:   Dionicia Abler, [name] 06/21/2003 12:39   ________________________________   Dionicia Abler, [name]   cd  D:  06/11/2003  T:  06/12/2003  7:37 P   [id number]

## 2004-02-25 ENCOUNTER — Encounter: Admission: RE | Admit: 2004-02-25 | Discharge: 2004-02-25 | Payer: Self-pay | Admitting: Family Medicine

## 2004-06-30 ENCOUNTER — Other Ambulatory Visit: Admission: RE | Admit: 2004-06-30 | Discharge: 2004-06-30 | Payer: Self-pay | Admitting: Family Medicine

## 2004-10-29 NOTE — Procedures (Signed)
[facility]                              CARDIOLOGY DEPARTMENT                              ECHOCARDIOGRAM REPORT   Name: [name] [name], [name] [name]            Location: OP         Age: 71   Date: 10/29/2004   Ref Phys: L. Carideo                         MR#: [id number]   Tape/Index:   Reason For Study: Murmur   M-Mode Measurements (Parasternal):                Measured:   Normal:   RV Internal Diastolic Diameter             -- mm                    7-23 mm   Ventricular Septal Thickness                9 mm                    7-11 mm   LV Internal Diastolic Diameter             40 mm                   37-54 mm   LV Posterior Wall Thickness                 8 mm                    7-11 mm   LV Internal Systolic Diameter              20 mm   Aortic Root Diameter                       34 mm                   20-37 mm   Aortic Valve Cusp Separation               -- mm                   16-26 mm   Left Atrial Diameter                       33 mm                   19-40 mm   2-Dimensional and Doppler Findings:   INDICATION:  Murmur   FINDINGS:   1. Normal left ventricular cavity size and wall motion with normal visually       estimated left ventricular ejection fraction.   2. Right ventricle is normal in size with normal estimated right       ventricular ejection fraction.   3. Based on spectral waveform of tricuspid valvular insufficiency,       estimated pulmonary artery systolic pressure is 44 mmHg.   TESTS PERFORMED:  2-dimensional echocardiography and Doppler color flow   imaging.   __________________________________________________   Jomarie Longs A.  ROBBINS, [name]   ds  D: 10/29/2004  T: 10/29/2004  7:53 P    [id number]

## 2004-10-29 NOTE — Procedures (Signed)
[facility]                              CARDIOLOGY DEPARTMENT                              ECHOCARDIOGRAM REPORT   Name: Engram, [name]            Location: OP         Age: 71   Date: 10/29/2004   Ref Phys: L. Carideo                         MR#: [id number]   Tape/Index:   Reason For Study: Murmur   M-Mode Measurements (Parasternal):                Measured:   Normal:   RV Internal Diastolic Diameter             -- mm                    7-23 mm   Ventricular Septal Thickness                9 mm                    7-11 mm   LV Internal Diastolic Diameter             40 mm                   37-54 mm   LV Posterior Wall Thickness                 8 mm                    7-11 mm   LV Internal Systolic Diameter              20 mm   Aortic Root Diameter                       34 mm                   20-37 mm   Aortic Valve Cusp Separation               -- mm                   16-26 mm   Left Atrial Diameter                       33 mm                   19-40 mm   2-Dimensional and Doppler Findings:   INDICATION:  Murmur   FINDINGS:   1. Normal left ventricular cavity size and wall motion with normal visually       estimated left ventricular ejection fraction.   2. Right ventricle is normal in size with normal estimated right       ventricular ejection fraction.   3. Based on spectral waveform of tricuspid valvular insufficiency,       estimated pulmonary artery systolic pressure is 44 mmHg.   TESTS PERFORMED:  2-dimensional echocardiography and Doppler color flow   imaging.   __________________________________________________   JOSEPH A.   ROBBINS, [name]   ds  D: 10/29/2004  T: 10/29/2004  7:53 P    [id number]

## 2005-01-03 ENCOUNTER — Emergency Department (HOSPITAL_COMMUNITY): Admission: EM | Admit: 2005-01-03 | Discharge: 2005-01-03 | Payer: Self-pay | Admitting: Family Medicine

## 2005-01-05 ENCOUNTER — Emergency Department (HOSPITAL_COMMUNITY): Admission: EM | Admit: 2005-01-05 | Discharge: 2005-01-05 | Payer: Self-pay | Admitting: Emergency Medicine

## 2005-02-10 NOTE — Op Note (Signed)
[facility]                                OPERATION REPORT                         SURGEON:  Vista Lawman, [name]   [facility], Britta Mccreedy   E:   MR  [id number]                         DATE:            02/10/2005   #:   [name]  [id number]                      PT. LOCATION:    OR  OR09   #   PAT Brown Human, [name]   cc:    Vista Lawman, [name]   PREOPERATIVE DIAGNOSIS:   Right carpal tunnel syndrome   POSTOPERATIVE DIAGNOSIS:   Right carpal tunnel syndrome   PROCEDURE:   Neurolysis of median nerve, right carpal canal   SURGEON:   Dr. Florence Canner   ANESTHESIA:   Local and sedation by [facility]   DESCRIPTION OF PROCEDURE:  After adequate anesthesia was obtained the   patient's right arm was prepped and draped in the usual sterile fashion.   After exsanguination with an Esmarch a tourniquet was inflated to 250 mmHg.   At this point, an intrathenar incision was made in the palm of the right   hand.  It was carried down through subcutaneous tissue.  The transverse   carpal ligament was identified and incised.  A hemostat was placed in the   ulnar side of the carpal canal.  The transverse carpal ligament was lysed   from proximal to distal, and the forearm fascia was split proximally.  The   median nerve was neurolysed in its entirety.  The floor of the carpal canal   was inspected and noted to be free of osteophytes and tumors.  The wound   was irrigated out.  It was infiltrated with Marcaine.  The tourniquet was   deflated.  Tourniquet time was 5 minutes.  Hemostasis was obtained.  Skin   was then closed with 5-0 nylon sutures.  A sterile dressing with a volar   splint was applied and the patient was taken from OR to the recovery room   in good condition.   FINAL DIAGNOSIS:   Right carpal tunnel syndrome   Electronically Signed By:   Vista Lawman, [name] 02/11/2005 14:05   _________________________________   Vista Lawman, [name]   sc  D:  02/10/2005  T:  02/10/2005  8:42 A   [id number]

## 2005-02-10 NOTE — Op Note (Signed)
[facility]                                OPERATION REPORT                         SURGEON:  PAT L. AULICINO, [name]   [name] [name], [name] [name]   E:   MR  [id number]                         DATE:            02/10/2005   #:   SS  [id number]                      PT. LOCATION:    OR  OR09   #   PAT L. AULICINO, [name]   cc:    PAT L. AULICINO, [name]   PREOPERATIVE DIAGNOSIS:   Right carpal tunnel syndrome   POSTOPERATIVE DIAGNOSIS:   Right carpal tunnel syndrome   PROCEDURE:   Neurolysis of median nerve, right carpal canal   SURGEON:   Dr. Aulicino   ANESTHESIA:   Local and sedation by [facility]   DESCRIPTION OF PROCEDURE:  After adequate anesthesia was obtained the   patient's right arm was prepped and draped in the usual sterile fashion.   After exsanguination with an Esmarch a tourniquet was inflated to 250 mmHg.   At this point, an intrathenar incision was made in the palm of the right   hand.  It was carried down through subcutaneous tissue.  The transverse   carpal ligament was identified and incised.  A hemostat was placed in the   ulnar side of the carpal canal.  The transverse carpal ligament was lysed   from proximal to distal, and the forearm fascia was split proximally.  The   median nerve was neurolysed in its entirety.  The floor of the carpal canal   was inspected and noted to be free of osteophytes and tumors.  The wound   was irrigated out.  It was infiltrated with Marcaine.  The tourniquet was   deflated.  Tourniquet time was 5 minutes.  Hemostasis was obtained.  Skin   was then closed with 5-0 nylon sutures.  A sterile dressing with a volar   splint was applied and the patient was taken from OR to the recovery room   in good condition.   FINAL DIAGNOSIS:   Right carpal tunnel syndrome   Electronically Signed By:   PAT L. AULICINO, [name] 02/11/2005 14:05   _________________________________   PAT L. AULICINO, [name]   sc  D:  02/10/2005  T:  02/10/2005  8:42 A   [id number]

## 2005-04-07 NOTE — Op Note (Signed)
[facility]                                OPERATION REPORT                        SURGEON:  Tollie Eth, [name]   [facility]   E:   MR  [id number]                         DATE:            04/07/2005   #:   [name]  [id number]                      PT. LOCATION:   #   Tollie Eth, [name]   cc:    Tollie Eth, [name]   PREOPERATIVE DIAGNOSES:   Adhesiocapsulitis, degenerative joint disease, Impingement and   acromioclavicular joint of the right shoulder.   POSTOPERATIVE DIAGNOSES:   Adhesiocapsulitis, degenerative joint disease, Impingement and   acromioclavicular joint of the right shoulder.   PROCEDURES PERFORMED:   Manipulation under anesthesia, arthroscopy, debridement of synovium,   acromioplasty and arthroscopic Mumford procedure of the right shoulder.   ATTENDING PHYSICIAN:   [name] Maw, MD   ANESTHESIA:   General   DESCRIPTION OF PROCEDURE:  The patient was taken to the operating room and   was given a satisfactory general anesthetic.  The right shoulder was then   examined under anesthesia.  The patient had forward flexion to about 150   degrees.  She was manipulated to a full overhead position.  Adhesions were   torn.  She was manipulated into full internal and external rotation.  Again   some adhesions were torn.  She had full adduction and abduction.  She was   slightly springy at the end of the motion.  There was no sign of any   instability.   The patient was then rolled into the left lateral decubitus position. The   right arm was placed in straight traction using 5 pounds of weight.  The   right shoulder was prepped and draped in the usual sterile fashion.   Lateral and posterior portals were identified and injected with 0.25%   Marcaine with epinephrine.  Stab wounds were then made in these areas.  The   arthroscope was then inserted through the posterior portal and entered the   shoulder joint without difficulty.  The biceps tendon was identified.  It   was  intact.  The synovium was markedly hypertrophied and hyperemic.  The   anterior, inferior and posterior labra appeared normal.  The joint surfaces   showed some grade 3 and occasional grade 4 changes on the humeral head.   There were some grade 2 changes on the glenoid surface.  The undersurface   of the rotator cuff was examined.  She had some small calcific deposits,   but there were no signs of any tears.  It was probed from above and she had   excellent thickness of the cuff.   A switch stick was then used to create an anterior portal.  A turbo trimmer   was inserted and used to d bride the hypertrophied and hyperemic synovium.   When this was completed, the shoulder was injected with a mixture of   Marcaine and  Kenalog.  The instruments were then removed from the shoulder   joint.   The arthroscope was then inserted into the subacromial space.  The bursa   was hypertrophied.  The undersurface of the acromion was d brided using a   Turbovac wand.  The lateral and anterior borders of the acromion were   outlined using the Turbovac wand.  She had some spurring anteriorly.  The   [facility] joint was identified.  She had marked arthritic changes of the [facility] joint.   An acromionizer was then inserted and used to perform an acromioplasty.  It   was taken fully anteriorly and laterally, tapered posteriorly and medially.   The acromionizer was then placed through the anterior portal and an   arthroscopic Mumford procedure was performed.  Once this was completed the   subacromial space was thoroughly irrigated.  The patient had free movement   of the [facility] joint at this point.  The subacromial space was injected with a   mixture of Marcaine and Kenalog.  The instruments were removed.  The wound   was closed with Steri-Strips.  A sterile dressing was applied.  The patient   was taken to the recovery room in a satisfactory condition.  She tolerated   the procedure well.   Electronically Signed By:   Tollie Eth, [name] 04/13/2005  10:17   _________________________________   Tollie Eth, [name]   sc  D:  04/07/2005  T:  04/07/2005  3:56 P   [id number]

## 2005-04-07 NOTE — Op Note (Signed)
[facility]                                OPERATION REPORT                        SURGEON:  Tollie Eth, [name]   [facility]   E:   MR  [id number]                         DATE:            04/07/2005   #:   [name] [name]  [id number]                      PT. LOCATION:   #   Tollie Eth, [name]   cc:    Tollie Eth, [name]   PREOPERATIVE DIAGNOSES:   Adhesiocapsulitis, degenerative joint disease, Impingement and   acromioclavicular joint of the right shoulder.   POSTOPERATIVE DIAGNOSES:   Adhesiocapsulitis, degenerative joint disease, Impingement and   acromioclavicular joint of the right shoulder.   PROCEDURES PERFORMED:   Manipulation under anesthesia, arthroscopy, debridement of synovium,   acromioplasty and arthroscopic Mumford procedure of the right shoulder.   ATTENDING PHYSICIAN:   [name] Maw, MD   ANESTHESIA:   General   DESCRIPTION OF PROCEDURE:  The patient was taken to the operating room and   was given a satisfactory general anesthetic.  The right shoulder was then   examined under anesthesia.  The patient had forward flexion to about 150   degrees.  She was manipulated to a full overhead position.  Adhesions were   torn.  She was manipulated into full internal and external rotation.  Again   some adhesions were torn.  She had full adduction and abduction.  She was   slightly springy at the end of the motion.  There was no sign of any   instability.   The patient was then rolled into the left lateral decubitus position. The   right arm was placed in straight traction using 5 pounds of weight.  The   right shoulder was prepped and draped in the usual sterile fashion.   Lateral and posterior portals were identified and injected with 0.25%   Marcaine with epinephrine.  Stab wounds were then made in these areas.  The   arthroscope was then inserted through the posterior portal and entered the   shoulder joint without difficulty.  The biceps tendon was identified.  It    was intact.  The synovium was markedly hypertrophied and hyperemic.  The   anterior, inferior and posterior labra appeared normal.  The joint surfaces   showed some grade 3 and occasional grade 4 changes on the humeral head.   There were some grade 2 changes on the glenoid surface.  The undersurface   of the rotator cuff was examined.  She had some small calcific deposits,   but there were no signs of any tears.  It was probed from above and she had   excellent thickness of the cuff.   A switch stick was then used to create an anterior portal.  A turbo trimmer   was inserted and used to d bride the hypertrophied and hyperemic synovium.   When this was completed, the shoulder was injected with a mixture of   Marcaine and  Kenalog.  The instruments were then removed from the shoulder   joint.   The arthroscope was then inserted into the subacromial space.  The bursa   was hypertrophied.  The undersurface of the acromion was d brided using a   Turbovac wand.  The lateral and anterior borders of the acromion were   outlined using the Turbovac wand.  She had some spurring anteriorly.  The   [facility] joint was identified.  She had marked arthritic changes of the [facility] joint.   An acromionizer was then inserted and used to perform an acromioplasty.  It   was taken fully anteriorly and laterally, tapered posteriorly and medially.   The acromionizer was then placed through the anterior portal and an   arthroscopic Mumford procedure was performed.  Once this was completed the   subacromial space was thoroughly irrigated.  The patient had free movement   of the [facility] joint at this point.  The subacromial space was injected with a   mixture of Marcaine and Kenalog.  The instruments were removed.  The wound   was closed with Steri-Strips.  A sterile dressing was applied.  The patient   was taken to the recovery room in a satisfactory condition.  She tolerated   the procedure well.   Electronically Signed By:    Tollie Eth, [name] 04/13/2005 10:17   _________________________________   Tollie Eth, [name]   sc  D:  04/07/2005  T:  04/07/2005  3:56 P   [id number]

## 2006-06-18 ENCOUNTER — Encounter: Admission: RE | Admit: 2006-06-18 | Discharge: 2006-06-18 | Payer: Self-pay | Admitting: Family Medicine

## 2007-01-24 NOTE — Procedures (Signed)
Test Reason : pre-op   Blood Pressure : ***/*** mmHG   Vent. Rate : 068 BPM     Atrial Rate : 068 BPM   P-R Int : 176 ms          QRS Dur : 072 ms   QT Int : 400 ms       P-R-T Axes : 046 -21 019 degrees   QTc Int : 425 ms   Sinus rhythm with occasional premature ventricular complexes   Septal infarct , age undetermined   No previous ECGs available   Confirmed by Cleon Gustin, [name], Charles ([id number]) on 01/24/2007 2:37:18 PM   Referred By:             Overread By: Marisue Ivan, [name]

## 2007-01-24 NOTE — Procedures (Signed)
Test Reason : pre-op   Blood Pressure : ***/*** mmHG   Vent. Rate : 068 BPM     Atrial Rate : 068 BPM   P-R Int : 176 ms          QRS Dur : 072 ms   QT Int : 400 ms       P-R-T Axes : 046 -21 019 degrees   QTc Int : 425 ms   Sinus rhythm with occasional premature ventricular complexes   Septal infarct , age undetermined   No previous ECGs available   Confirmed by Ashby, [name], Charles ([id number]) on 01/24/2007 2:37:18 PM   Referred By:             Overread By: Charles Ashby, [name]

## 2007-02-07 NOTE — Consults (Signed)
[facility]                               CONSULTATION REPORT                        CONSULTANT:  Gracy Racer, [name]   NAME:       [name] [name]   BILLING #:                         DATE OF CONSULT:     02/10/2007   MR #:       [id number]               ADM DATE:   SS #                               PT. LOCATION:               DOB:                   AGE:                 SEX:   ATTENDING:   cc:    Gracy Racer, [name]   CHIEF COMPLAINT:  "I am having hip surgery.   SUMMARY OF CONSULT:  I was asked to see this very pleasant 73 year old   white female who is scheduled for elective right hip replacement.  She has   had some pain for about 7 months and has had x-rays showing decreased   cartilage.  She recently saw Dr. Sherlon Handing who thought that she should have a   hip replacement.  She is scheduled for this on February 15, 2007.   A medical consult was requested for preoperative problems.  She has   significant pseudogout and has pain in her hands.  This is managed by   Dr. Leanne Lovely and she is currently on 6 mg of prednisone daily, but has been on   higher doses than this in the past.  She also suffers from hemochromatosis   and has phlebotomized periodically by hematology because of elevated   ferritin levels.  Recently her ferritin levels have been normal at 50.   She is also hypotensive, but denies chest pain, palpitations, orthopnea or   edema and her blood pressure has been well controlled.   PAST HISTORY:   1. Hypothyroidism.   2. Hyperlipidemia.   3. Hemochromatosis.   4. Chronic steroid use.   5. Pseudogout.   6. Osteoporosis.   7. She has had a partial thyroidectomy in her 105s for a goiter and then       radioactive ablation for what sounds like Graves disease later on.  She       has been on a thyroid supplementation since.   FAMILY HISTORY:  Reveals mother and father are deceased.  Her mother died   of old age in her 8s.  Her father died of lung cancer and was a heavy    smoker.   ALLERGIES:  Sulfa.   SOCIAL HISTORY:  She does not smoke or drink.  She is a retired Runner, broadcasting/film/video.   REVIEW OF SYSTEMS:   CONSTITUTIONAL:  Increased weight.   HEAD AND EYES:  No headache or blurred vision.   EAR, NOSE AND THROAT:  No  rhinitis, sore throat or hearing loss.   PULMONARY:  No wheezing or coughing.   CARDIAC:  No chest pain.   GI:  No nausea, vomiting or diarrhea.   GENITOURINARY:  No dysuria or hematuria.   INTEGUMENTARY:  No rash.   NEUROLOGIC:  No syncope.   PSYCHIATRIC:  No depression.   MUSCULOSKELETAL:  Reveals considerable pain in her hands and excruciating   pain in her right hip.   PHYSICAL EXAMINATION:   VITAL SIGNS:  Today blood pressure is 140/80, pulse 70, respiratory rate   16.   GENERAL:  She is in no distress unless she tries to move and then it is   obvious that her hip is hurting her.   HEAD:  Normocephalic.   EYES:  PERL.  Conjunctivae are pink.  Lids are flat.   EARS, NOSE AND THROAT:  External ears, canals and drums normal.  Nares   patent without rhinitis.  Throat is noninjected.   NECK:  Symmetric.  There are scars of previously surgery.  Thyroid is not   present.   LUNGS:  Clear to A&amp;P.   BREASTS:  Atrophic without masses or discharge.   CARDIAC:  PMI nondisplaced without murmur or gallop.   ABDOMEN:  Obese and nontender without organomegaly.   RECTAL/PELVIC:  Not done.   EXTREMITIES:  No clubbing, cyanosis or edema.   SKIN:  Warm and dry without rash or nodules.   MUSCULOSKELETAL:  Shows that she has trouble moving because of hip pain.   She has changes to degenerative joint disease in her hands.   NEUROLOGICAL:  Moves all with symmetry.  There is normal sensation.   IMPRESSIONS:   1. Hypertension.   2. Pseudogout.   3. Chronic steroid use.   4. Hemochromatosis.   5. Osteopenia.   PLAN:  The patient has been on steroids for sometime.  I would recommend   the use of hydrocortisone 100 mg IV prior to surgery and 100 mg in the IV    running during surgery, and a 100 mg IV at the completion of her surgery.   She should take her oral medications with a sip of water prior to having   surgery.  Her lab work has been ordered and will be reviewed.  I will   follow her along with you perioperatively.  I am getting a thyroid study on   her today as she has not had one done and the last indicated some degree of   hyperthyroidism.  We will follow her with you perioperatively with you in   the hospital.  She should have anticoagulation postop in the form of   Lovenox and/or Coumadin.   Electronically Signed By:   Gracy Racer, [name] 02/17/2007 16:13   ____________________________   Gracy Racer, [name]   ST  D:  02/07/2007  T:  02/07/2007  7:41 P   [id number]

## 2007-02-15 NOTE — Op Note (Signed)
 [facility]                                OPERATION REPORT   [name] [name], [name] [name]   E:   MR  [id number]                         DATE:            02/15/2007   #:   [name] [name]  [id number]                      PT. LOCATION:    [id number]   #   [name] [name], [name]   cc:    [name] [name], [name]   INDICATIONS FOR SURGERY:   Severe osteoarthritis with affectation of the right hip.   PROCEDURE:   Right  total hip arthroplasty.   This was done through an anterolateral muscle splitting approach.   SURGEON:   [name] [name], [name].   ANESTHESIA:   General.   ASSISTANT:   [name] [name]   DESCRIPTION OF THE PROCEDURE: After anesthesia was induced, Foley catheter   was placed. The patient was rolled into the left lateral decubitus position   and the right  hip prepared and draped in the  usual fashion.  Dextran was   running as an antiplatelet agonist.   The anterolateral approach to the hip was made, an incision made from just   over the center of the greater trochanter to a line approximately 4-cm   posterior to the anterior superior iliac spine. Through this incision, the   fascia lata was opened.   This was dissected slightly behind the trochanter.   Finger dissection was then used to peel the gluteus medius from the   undersurface of the fascia lata, and exposed the anterior aspect of the   femoral neck. Retractors were placed around the capsule of the femur.  The   branch of the gluteal nerve to the tensor fascia was identified and spared.   Once the femoral capsule was identified, it was opened in a Z-type fashion.   Retractors placed about the proximal femur and the capsulotomy were taken   over the tip of the trochanter.   Intracapsular retraction was then accomplished. Then the first femoral   osteotomy was done subcapitally along the opening of the acetabulum.   This then allowed by externally rotating the femur, placing the foot into   the sterile pouch next to the operating  table to bring the femoral neck   forward.   Distance of approximately 1-cm was made distal to the saddle. This was   marked and then the proximal femoral neck osteotomy was performed to the   appropriate level, approximately a fingerbreadth above the lesser   trochanter.   The limb was then placed back on the table, retractors placed about the   acetabulum and traction placed on the limb.  This allowed us  to remove the   labrum of the acetabulum.   The fluoroscope was then introduced, the table tilted to provide   appropriate perpendicular alignment of the pelvis to the floor, and then   acetabular reaming was accomplished.  In this case, it was done up to   50-mm. This brought us  into good cancellous bone, and did not violate the   teardrop.   A 50-mm trial was  placed. This was found to fit quite nicely.  Copious   lavage was performed, and then the trabecular metal 52-mm outer diameter   prosthesis was impacted into place.  This was quite solid . Anteversion, as   well as abduction and adduction were identified and controlled   fluoroscopically during impaction to assure we had a 45 degree abduction   and appropriate anteversion.    Further fixation was secured with the acetabulum into the isthmus of the   pelvis.   A 10 degree lipped liner was placed, the lip oriented superiorly and   posterior.   Once this had been accomplished, the femoral head and neck were then again   brought into the wound by means of external rotation, flexion of the knee   into the bag, and hyperextension of the hip.   The proximal femur was then prepared, first using the  box osteotome then   the canal finder, then the lateralizer, then finally rasps as well as canal   reamers to allow us  to place the 12-mm standard metaphyseal trabecular   metal femoral component.   This fit quite snugly.   The femoral head was a 32-mm in diameter large metal head in the trial,   with   A 0-mm head neck was placed.   This provided excellent leg  length, as noted on the fluoroscopy, and the   hip was quite stable.  It could be flexed to 90, internally rotated 50   degrees, and neutral abduction and adduction without dislocation. The hip   could be extended, adducted, and externally rotated without dislocation.   Radiographs demonstrated appropriate fit and fill of the components.   The hip was again dislocated, exposed. The trial was removed. Copious   lavage accomplished with antibiotic irrigant into the canal.  The final   components were then placed, size as previously noted.   The hip was then reduced.  It was again confirmed radiographically.  The   range of motion was again confirmed.   Copious lavage was then performed.  The Z-plasty of the capsule was then   repaired.   Drains were then placed. The vastus medialis and tensor fascia lata   interval was then closed with the iliotibial band.   The wound was then closed in layers.  Sterile dressings were applied.  The   limb was then supported in a toe-to-hip spica, as well as abduction pillow.   She tolerated this procedure, left the operating room in satisfactory   condition.   Electronically Signed By:   [name] [name], [name] 03/01/2007 17:59   ____________________________   [name] [name], [name]   AK  D:  02/15/2007  T:  02/15/2007 11:19 P   [id number]

## 2007-02-15 NOTE — Op Note (Signed)
[facility]                                OPERATION REPORT   [name] [name], [name] [name]   E:   MR  [id number]                         DATE:            02/15/2007   #:   [name] [name]  [id number]                      PT. LOCATION:    [id number]   #   Duanne Limerick, [name]   cc:    Duanne Limerick, [name]   INDICATIONS FOR SURGERY:   Severe osteoarthritis with affectation of the right hip.   PROCEDURE:   Right  total hip arthroplasty.   This was done through an anterolateral muscle splitting approach.   SURGEON:   Elmer Sow. Sherlon Handing, [name].   ANESTHESIA:   General.   ASSISTANT:   Clydie Braun   DESCRIPTION OF THE PROCEDURE: After anesthesia was induced, Foley catheter   was placed. The patient was rolled into the left lateral decubitus position   and the right  hip prepared and draped in the  usual fashion.  Dextran was   running as an antiplatelet agonist.   The anterolateral approach to the hip was made, an incision made from just   over the center of the greater trochanter to a line approximately 4-cm   posterior to the anterior superior iliac spine. Through this incision, the   fascia lata was opened.   This was dissected slightly behind the trochanter.   Finger dissection was then used to peel the gluteus medius from the   undersurface of the fascia lata, and exposed the anterior aspect of the   femoral neck. Retractors were placed around the capsule of the femur.  The   branch of the gluteal nerve to the tensor fascia was identified and spared.   Once the femoral capsule was identified, it was opened in a Z-type fashion.   Retractors placed about the proximal femur and the capsulotomy were taken   over the tip of the trochanter.   Intracapsular retraction was then accomplished. Then the first femoral   osteotomy was done subcapitally along the opening of the acetabulum.   This then allowed by externally rotating the femur, placing the foot into    the sterile pouch next to the operating table to bring the femoral neck   forward.   Distance of approximately 1-cm was made distal to the "saddle". This was   marked and then the proximal femoral neck osteotomy was performed to the   appropriate level, approximately a fingerbreadth above the lesser   trochanter.   The limb was then placed back on the table, retractors placed about the   acetabulum and traction placed on the limb.  This allowed [place] to remove the   labrum of the acetabulum.   The fluoroscope was then introduced, the table tilted to provide   appropriate perpendicular alignment of the pelvis to the floor, and then   acetabular reaming was accomplished.  In this case, it was done up to   50-mm. This brought [place] into good cancellous bone, and did not violate the   teardrop.   A 50-mm trial was  placed. This was found to fit quite nicely.  Copious   lavage was performed, and then the trabecular metal 52-mm outer diameter   prosthesis was impacted into place.  This was quite solid . Anteversion, as   well as abduction and adduction were identified and controlled   fluoroscopically during impaction to assure we had a 45 degree abduction   and appropriate anteversion.    Further fixation was secured with the acetabulum into the isthmus of the   pelvis.   A 10 degree lipped liner was placed, the lip oriented superiorly and   posterior.   Once this had been accomplished, the femoral head and neck were then again   brought into the wound by means of external rotation, flexion of the knee   into the bag, and hyperextension of the hip.   The proximal femur was then prepared, first using the  box osteotome then   the canal finder, then the lateralizer, then finally rasps as well as canal   reamers to allow [place] to place the 12-mm standard metaphyseal trabecular   metal femoral component.   This fit quite snugly.   The femoral head was a 32-mm in diameter large metal head in the trial,   with    A 0-mm head neck was placed.   This provided excellent leg length, as noted on the fluoroscopy, and the   hip was quite stable.  It could be flexed to 90, internally rotated 50   degrees, and neutral abduction and adduction without dislocation. The hip   could be extended, adducted, and externally rotated without dislocation.   Radiographs demonstrated appropriate fit and fill of the components.   The hip was again dislocated, exposed. The trial was removed. Copious   lavage accomplished with antibiotic irrigant into the canal.  The final   components were then placed, size as previously noted.   The hip was then reduced.  It was again confirmed radiographically.  The   range of motion was again confirmed.   Copious lavage was then performed.  The Z-plasty of the capsule was then   repaired.   Drains were then placed. The vastus medialis and tensor fascia lata   interval was then closed with the iliotibial band.   The wound was then closed in layers.  Sterile dressings were applied.  The   limb was then supported in a toe-to-hip spica, as well as abduction pillow.   She tolerated this procedure, left the operating room in satisfactory   condition.   Electronically Signed By:   Duanne Limerick, [name] 03/01/2007 17:59   ____________________________   Duanne Limerick, [name]   AK  D:  02/15/2007  T:  02/15/2007 11:19 P   [id number]

## 2007-02-22 ENCOUNTER — Encounter: Admission: RE | Admit: 2007-02-22 | Discharge: 2007-02-22 | Payer: Self-pay | Admitting: Family Medicine

## 2007-04-24 NOTE — Discharge Summary (Signed)
[facility]                                DISCHARGE SUMMARY   NAME:  [name] [name], [name] [name]   MR #:  [id number]                      ADM DATE:   [name]                               DIS DATE:   G#   SS #   [id number]                   DOB:  [birth year]   Duanne Limerick, [name]              AGE:74                   SEX:  F   cc:    Duanne Limerick, [name]   [name]'s history and physical are well-documented and will not be   regurgitated.   The patient was admitted for elective right total hip arthroplasty.   She was taken to surgery on the day of admission where she had a right   total hip arthroplasty using an anterolateral muscle-splitting approach.  A   trabecular metal acetabulum 50-mm and trabecular metal femoral component   size 12, 32-mm head, length 0.   POSTOPERATIVE COURSE:  Medical consultation was obtained.  The Foley was   out postop 1, drains and epidural out postop 1, drains out postop 2, and   Lovenox was started on postop 1.  She did quite well.  She had a stable   hematocrit by the third postoperative day.  She was doing well.  Ambulating   partial weight-bearing on the floor.   Her bandage was changed.  The wound was clean and dry.  She is discharged   home PT, home Lovenox, and we will see her back in the office in another 2   weeks for suture removal.   Electronically Signed By:   Duanne Limerick, [name] 04/26/2007 07:51   ____________________________   Duanne Limerick, [name]   HP  D:  04/24/2007  T:  04/24/2007  1:42 P   [id number]

## 2007-06-16 NOTE — Op Note (Signed)
[facility]                                OPERATION REPORT                         SURGEON:  Hilbert Corrigan, [name]   [facility], [name] [name]   E:   MR  [id number]                DATE OF SURGERY:                     06/16/2007   #:   [name] [name]  [id number]             PT. LOCATION:   #   Hilbert Corrigan, [name]        DOB: [birth year]        AGE:74        SEX:  F   cc:    Hilbert Corrigan, [name]   PREOPERATIVE DIAGNOSES:   Nuclear cataract, right eye   POSTOPERATIVE DIAGNOSES:   Nuclear cataract, right eye.   PROCEDURE:   Clear corneal temporal incision cataract extraction with   phacoemulsification, foldable intraocular lens implant, right eye.   SURGEON:   Dr. Sylvan Cheese   ANESTHESIA:   IV sedation and sub-Tenon injection of Xylocaine 1% preservative free, 1.5   cc superonasal and 0.5 cc of the same intracameral.   LENS STYLE:   AMO Model ZCB00, 23 diopters   COMPLICATIONS:   None   DESCRIPTION OF PROCEDURE:  The patient was brought to the operating suite,   vital signs were monitored, IV started, appropriately sedated.  The right   eye pupil completely dilated, taken to the main operating room in supine   position.  Eyelids were prepped with Betadine and BSS, appropriately   draped.  Sub-Tenon injection applied superonasally.  A paracentesis was   made at 12 o'clock and 0.5 cc of Xylocaine 1% preservative free and Provisc   injected into the anterior chamber.  Temporal clear cornea 2.8-millimeter   keratome incision was made into the anterior chamber.  Peripheral anterior   capsulotomy was performed with disposable cystotome and Utrata forceps, 5.5   diameter, round.  Hydrodissection of the lens nucleus followed by   phacoemulsification of the lens, irrigation and aspiration of cortex.   Capsular bag filled with Provisc.  Intraocular lens was injected into the   capsular bag.  The residual viscoelastic was irrigated and aspirated from   anterior and posterior chamber, replaced with BSS and a few drops of   Miostat.   The incision was hydrated and checked, it was sealed, no suture   necessary.  The eye surface was irrigated with 100 mg Kefzol.  A strip of   Viscoat applied on the incision.  A few drops of Zymar placed on the   cornea.  The lid speculum applied was removed.  Plastic eye shield was   placed on the eye, secured with tape.   The patient was escorted to the   recovery room in a stable condition to be discharged with postoperative   instructions, return to physician's office tomorrow at 8:00 a.m.   Electronically Signed By:   Hilbert Corrigan, [name] 06/22/2007 13:14   ____________________________   Hilbert Corrigan, [name]   MH  D:  06/16/2007  T:  06/17/2007  1:26 P   [id number]

## 2007-06-16 NOTE — Op Note (Signed)
[facility]                                OPERATION REPORT                         SURGEON:  Hilbert Corrigan, [name]   [facility], Britta Mccreedy K   E:   MR  [id number]                DATE OF SURGERY:                     06/16/2007   #:   [name]  [id number]             PT. LOCATION:   #   Hilbert Corrigan, [name]        DOB: 12-24-1933        AGE:74        SEX:  F   cc:    Hilbert Corrigan, [name]   PREOPERATIVE DIAGNOSES:   Nuclear cataract, right eye   POSTOPERATIVE DIAGNOSES:   Nuclear cataract, right eye.   PROCEDURE:   Clear corneal temporal incision cataract extraction with   phacoemulsification, foldable intraocular lens implant, right eye.   SURGEON:   Dr. Sylvan Cheese   ANESTHESIA:   IV sedation and sub-Tenon injection of Xylocaine 1% preservative free, 1.5   cc superonasal and 0.5 cc of the same intracameral.   LENS STYLE:   AMO Model ZCB00, 23 diopters   COMPLICATIONS:   None   DESCRIPTION OF PROCEDURE:  The patient was brought to the operating suite,   vital signs were monitored, IV started, appropriately sedated.  The right   eye pupil completely dilated, taken to the main operating room in supine   position.  Eyelids were prepped with Betadine and BSS, appropriately   draped.  Sub-Tenon injection applied superonasally.  A paracentesis was   made at 12 o'clock and 0.5 cc of Xylocaine 1% preservative free and Provisc   injected into the anterior chamber.  Temporal clear cornea 2.8-millimeter   keratome incision was made into the anterior chamber.  Peripheral anterior   capsulotomy was performed with disposable cystotome and Utrata forceps, 5.5   diameter, round.  Hydrodissection of the lens nucleus followed by   phacoemulsification of the lens, irrigation and aspiration of cortex.   Capsular bag filled with Provisc.  Intraocular lens was injected into the   capsular bag.  The residual viscoelastic was irrigated and aspirated from   anterior and posterior chamber, replaced with BSS and a few drops of    Miostat.  The incision was hydrated and checked, it was sealed, no suture   necessary.  The eye surface was irrigated with 100 mg Kefzol.  A strip of   Viscoat applied on the incision.  A few drops of Zymar placed on the   cornea.  The lid speculum applied was removed.  Plastic eye shield was   placed on the eye, secured with tape.   The patient was escorted to the   recovery room in a stable condition to be discharged with postoperative   instructions, return to physician's office tomorrow at 8:00 a.m.   Electronically Signed By:   Hilbert Corrigan, [name] 06/22/2007 13:14   ____________________________   Hilbert Corrigan, [name]   MH  D:  06/16/2007  T:  06/17/2007  1:26 P   [id number]

## 2007-07-11 NOTE — Op Note (Signed)
[facility]                                OPERATION REPORT                         SURGEON:  Hilbert Corrigan, [name]   [facility], Britta Mccreedy K   E:   MR  [id number]                DATE OF SURGERY:                     07/10/2007   #:   [name]  [id number]             PT. LOCATION:   #   Hilbert Corrigan, [name]        DOB: December 25, 1933        AGE:74        SEX:  F   cc:    Hilbert Corrigan, [name]   PREOPERATIVE DIAGNOSIS:   Nuclear cataract, left eye.   POSTOPERATIVE DIAGNOSIS:   Nuclear cataract, left eye.   OPERATIVE PROCEDURE:   Clear corneal temporal incision cataract extraction with   phacoemulsification, foldable intraocular lens implant, left eye.   SURGEON:   S.H. Sylvan Cheese, [name]   ANESTHESIA:   IV sedation and sub-Tenon injection of Xylocaine 1% preservative free 1.5   cc inferonasal and 0.5 cc of the same intracamaral.   LENS STYLE IMPLANTED:   From AMO model ZCB00, 23 diopters.   COMPLICATIONS:   None.   DESCRIPTION OF PROCEDURE:  The patient was brought to the operating suite,   vital signs were monitored and IV started.  The left eye was dilated.  The   patient was taken to the main operating room in the supine position.  The   eyelids were prepped with Betadine and balanced salt solution and   appropriately draped.  The lid speculum was applied.  Two drops of Betadine   Povidone 5% ophthalmic solution was instilled over the cornea.  An   inferonasal sub-Tenon injection was applied with a special 19-gauge   cannula.  A paracentesis was made at the 6 o'clock limbus.  Xylocaine 0.5   cc 1% preservative free and Provisc was injected into the anterior chamber.   A temporal clear corneal 2.8 mm keratome incision was made into the eye and   Viscoat placed in the anterior chamber.  A peripheral anterior capsulotomy   was performed with the disposable sharp cystotome round and circular, 5.5   mm in diameter.  Hydrodissection of the lens nucleus was followed by   phacoemulsification of the lens and irrigation and  aspiration of the   cortex.  The capsular bag was filled with Provisc.  The intraocular lens   was injected in the bag.  Residual viscoelastics were irrigated and   aspirated from the anterior and posterior chambers and replaced with   balanced salt solution and a few drops of Miostat.  The incision was   hydrated and checked.  It was sealed and no suture was needed.  The eye   surface was irrigated with 100 mg of Kefzol and a strip of Viscoat was   applied on the incision and 2 drops of Zymar were placed on the cornea.   The lid speculum was removed.  The plastic eye shield was placed on the  eye   and secured with tape.  The patient was escorted to the recovery room to be   discharged with postoperative instructions to return to the physician's   office tomorrow at 8 a.m.   Electronically Signed By:   Hilbert Corrigan, [name] 07/14/2007 09:04   ____________________________   Hilbert Corrigan, [name]   Seleta Rhymes  D:  07/11/2007  T:  07/11/2007 10:49 A  [id number]

## 2007-07-11 NOTE — Op Note (Signed)
[facility]                                OPERATION REPORT                         SURGEON:  Hilbert Corrigan, [name]   [facility], Britta Mccreedy K   E:   MR  [id number]                DATE OF SURGERY:                     07/10/2007   #:   [name]  [id number]             PT. LOCATION:   #   Hilbert Corrigan, [name]        DOB: [birth year]        AGE:74        SEX:  F   cc:    Hilbert Corrigan, [name]   PREOPERATIVE DIAGNOSIS:   Nuclear cataract, left eye.   POSTOPERATIVE DIAGNOSIS:   Nuclear cataract, left eye.   OPERATIVE PROCEDURE:   Clear corneal temporal incision cataract extraction with   phacoemulsification, foldable intraocular lens implant, left eye.   SURGEON:   S.H. Sylvan Cheese, [name]   ANESTHESIA:   IV sedation and sub-Tenon injection of Xylocaine 1% preservative free 1.5   cc inferonasal and 0.5 cc of the same intracamaral.   LENS STYLE IMPLANTED:   From AMO model ZCB00, 23 diopters.   COMPLICATIONS:   None.   DESCRIPTION OF PROCEDURE:  The patient was brought to the operating suite,   vital signs were monitored and IV started.  The left eye was dilated.  The   patient was taken to the main operating room in the supine position.  The   eyelids were prepped with Betadine and balanced salt solution and   appropriately draped.  The lid speculum was applied.  Two drops of Betadine   Povidone 5% ophthalmic solution was instilled over the cornea.  An   inferonasal sub-Tenon injection was applied with a special 19-gauge   cannula.  A paracentesis was made at the 6 o'clock limbus.  Xylocaine 0.5   cc 1% preservative free and Provisc was injected into the anterior chamber.   A temporal clear corneal 2.8 mm keratome incision was made into the eye and   Viscoat placed in the anterior chamber.  A peripheral anterior capsulotomy   was performed with the disposable sharp cystotome round and circular, 5.5   mm in diameter.  Hydrodissection of the lens nucleus was followed by    phacoemulsification of the lens and irrigation and aspiration of the   cortex.  The capsular bag was filled with Provisc.  The intraocular lens   was injected in the bag.  Residual viscoelastics were irrigated and   aspirated from the anterior and posterior chambers and replaced with   balanced salt solution and a few drops of Miostat.  The incision was   hydrated and checked.  It was sealed and no suture was needed.  The eye   surface was irrigated with 100 mg of Kefzol and a strip of Viscoat was   applied on the incision and 2 drops of Zymar were placed on the cornea.   The lid speculum was removed.  The plastic eye shield was placed on the  eye   and secured with tape.  The patient was escorted to the recovery room to be   discharged with postoperative instructions to return to the physician's   office tomorrow at 8 a.m.   Electronically Signed By:   Hilbert Corrigan, [name] 07/14/2007 09:04   ____________________________   Hilbert Corrigan, [name]   Seleta Rhymes  D:  07/11/2007  T:  07/11/2007 10:49 A  [id number]

## 2008-01-06 ENCOUNTER — Observation Stay (HOSPITAL_COMMUNITY): Admission: EM | Admit: 2008-01-06 | Discharge: 2008-01-07 | Payer: Self-pay | Admitting: Emergency Medicine

## 2009-03-18 ENCOUNTER — Encounter: Admission: RE | Admit: 2009-03-18 | Discharge: 2009-03-18 | Payer: Self-pay | Admitting: Family Medicine

## 2009-08-25 ENCOUNTER — Emergency Department (HOSPITAL_COMMUNITY): Admission: EM | Admit: 2009-08-25 | Discharge: 2009-08-25 | Payer: Self-pay | Admitting: Emergency Medicine

## 2010-03-18 NOTE — Procedures (Signed)
Study ID: [id number]                                                      [facility]                                                      [address] [place], [place] [address]                                 Adult Echocardiogram Report       Name: [name] [name], [name] [name] Date: 03/18/2010 01:55 PM   MRN: [id number]                Patient Location: CC_PRE^^^C   DOB: 08-12-1933            Age: 77 yrs   Height: 62 in              Weight: 147 lb                                                                 BSA: 1.7 meters2   Gender: Female             Account #: [id number]   Reason For Study: murmur   History: htn   Ordering Physician: Gracy Racer   Performed By: Baron Hamper, RDCS       Interpretation Summary   A complete two-dimensional transthoracic echocardiogram was performed (2D, M-   mode, Doppler and color flow Doppler).   The study was technically adequate.   The left ventricle is normal in size with normal systolic function and normal   wall thickness.   The calculated left ventricular ejection fraction is 77%.   Right ventricular systolic pressure is normal at .   There are no hemodynamically significant valvular flow abnormalities.   Other findings are listed below.   Prior echo of 10/29/2004 reported pulmonary pressures at , otherwise no   change.       _____________________________________________________________________________   __           Left Ventricle   The left ventricle is normal in size. There is normal left ventricular wall   thickness. Left ventricular systolic function is normal. The calculated left   ventricular ejection fraction is 77%. The left ventricular wall motion is   normal.           Right Ventricle   The right ventricle is normal size.       Atria   The left  atrial size is normal. Right atrial size is normal. The  interatrial   septum is intact with no evidence for an atrial septal defect.           Mitral Valve   The mitral valve leaflets appear normal. There is no evidence of stenosis,   fluttering, or prolapse. There is trace mitral regurgitation.       Tricuspid Valve   The tricuspid valve is normal. There is mild tricuspid regurgitation.   Tricuspid regurgitation peak flow velocity is documented at 2.4 meters per   second. Based on the peak tricuspid regurgitation velocity the estimated right   ventricular systolic pressure is 27 mmHg.       Aortic Valve   Sclerotic edge thickening of the aortic valve with normal cusp mobility. Mean   aortic gradeint is and the maximum gradient is . No aortic   regurgitation is present.       Pulmonic Valve   The pulmonic valve is not well visualized. Trace pulmonic valvular   regurgitation.       Great Vessels   The aortic root is normal size.           Pericardium/Pleural   There is no pericardial effusion.       MMode/2D Measurements &amp; Calculations   IVSd: 1.00 cm                       LVIDd: 3.3 cm                                       LVIDs: 2.1 cm                                       LVPWd: 0.89 cm           _______________________________________________________________       FS: 34.7 %                          Ao root diam: 3.0 cm                                       Ao root area: 7.2 cm2                                       LA dimension: 2.4 cm           _______________________________________________________________       EF(sp4-el): 77.7 %                  SV(sp4-el): 56.4 ml       Doppler Measurements &amp; Calculations   MV A dur: 0.13 sec                     MV dec time: 0.34 sec   MV E max vel: 68.6 cm/sec   MV A max vel: 93.3 cm/sec   MV E/A: 0.74   LV IVRT: 0.08 sec               _______________________________________________________________   Ao V2 max: 153.4  cm/sec                LV V1 max PG: 5.9 mmHg   Ao max PG: 9.4 mmHg                    LV V1  max: 121.1 cm/sec   Ao V2 mean: 98.1 cm/sec   Ao mean PG: 4.6 mmHg           _______________________________________________________________       TV V2 max: 236.0 cm/sec                TR max vel: 234.8 cm/sec   TV max PG: 22.3 mmHg                   TR max PG: 22.1 mmHg           _______________________________________________________________       AVA (Dim Index): 0.79 cm2              E/A ratio (Valsalva): 0.71               _______________________________________________________________   E/E' lat: 8.5                          E/E' med: 9.4           _______________________________________________________________       MV A Velocity (Valsalva): 88.1 cm/sec  MV E Velocity (Valsalva): 62.6 cm/sec       _____________________________________________________________________________   __           Electronically signed byDr. Kem Kays, MD   03/18/2010 03:16 PM

## 2010-03-18 NOTE — Procedures (Signed)
Study ID: [id number]                                                      [facility]                                                      [address] [place], [place] [address]                                 Adult Echocardiogram Report       Name: [name] [name], [name] [name] Date: 03/18/2010 01:55 PM   MRN: [id number]                Patient Location: CC_PRE^^^C   DOB: 12/10/1933            Age: 77 yrs   Height: 62 in              Weight: 147 lb                                                                 BSA: 1.7 meters2   Gender: Female             Account #: [id number]   Reason For Study: murmur   History: htn   Ordering Physician: Gracy Racer   Performed By: Baron Hamper, RDCS       Interpretation Summary   A complete two-dimensional transthoracic echocardiogram was performed (2D, M-   mode, Doppler and color flow Doppler).   The study was technically adequate.   The left ventricle is normal in size with normal systolic function and normal   wall thickness.   The calculated left ventricular ejection fraction is 77%.   Right ventricular systolic pressure is normal at .   There are no hemodynamically significant valvular flow abnormalities.   Other findings are listed below.   Prior echo of 10/29/2004 reported pulmonary pressures at , otherwise no   change.       _____________________________________________________________________________   __           Left Ventricle   The left ventricle is normal in size. There is normal left ventricular wall   thickness. Left ventricular systolic function is normal. The calculated left   ventricular ejection fraction is 77%. The left ventricular wall motion is   normal.           Right Ventricle   The right ventricle is normal size.       Atria   The left  atrial size is normal. Right atrial size is normal. The  interatrial   septum is intact with no evidence for an atrial septal defect.           Mitral Valve   The mitral valve leaflets appear normal. There is no evidence of stenosis,   fluttering, or prolapse. There is trace mitral regurgitation.       Tricuspid Valve   The tricuspid valve is normal. There is mild tricuspid regurgitation.   Tricuspid regurgitation peak flow velocity is documented at 2.4 meters per   second. Based on the peak tricuspid regurgitation velocity the estimated right   ventricular systolic pressure is 27 mmHg.       Aortic Valve   Sclerotic edge thickening of the aortic valve with normal cusp mobility. Mean   aortic gradeint is and the maximum gradient is . No aortic   regurgitation is present.       Pulmonic Valve   The pulmonic valve is not well visualized. Trace pulmonic valvular   regurgitation.       Great Vessels   The aortic root is normal size.           Pericardium/Pleural   There is no pericardial effusion.       MMode/2D Measurements &amp; Calculations   IVSd: 1.00 cm                       LVIDd: 3.3 cm                                       LVIDs: 2.1 cm                                       LVPWd: 0.89 cm           _______________________________________________________________       FS: 34.7 %                          Ao root diam: 3.0 cm                                       Ao root area: 7.2 cm2                                       LA dimension: 2.4 cm           _______________________________________________________________       EF(sp4-el): 77.7 %                  SV(sp4-el): 56.4 ml       Doppler Measurements &amp; Calculations   MV A dur: 0.13 sec                     MV dec time: 0.34 sec   MV E max vel: 68.6 cm/sec   MV A max vel: 93.3 cm/sec   MV E/A: 0.74   LV IVRT: 0.08 sec               _______________________________________________________________   Ao V2 max: 153.4  cm/sec                LV V1 max PG: 5.9 mmHg    Ao max PG: 9.4 mmHg                    LV V1 max: 121.1 cm/sec   Ao V2 mean: 98.1 cm/sec   Ao mean PG: 4.6 mmHg           _______________________________________________________________       TV V2 max: 236.0 cm/sec                TR max vel: 234.8 cm/sec   TV max PG: 22.3 mmHg                   TR max PG: 22.1 mmHg           _______________________________________________________________       AVA (Dim Index): 0.79 cm2              E/A ratio (Valsalva): 0.71               _______________________________________________________________   E/E' lat: 8.5                          E/E' med: 9.4           _______________________________________________________________       MV A Velocity (Valsalva): 88.1 cm/sec  MV E Velocity (Valsalva): 62.6 cm/sec       _____________________________________________________________________________   __           Electronically signed byDr. Kem Kays, MD   03/18/2010 03:16 PM

## 2010-04-05 ENCOUNTER — Encounter: Payer: Self-pay | Admitting: Emergency Medicine

## 2010-04-06 ENCOUNTER — Encounter: Payer: Self-pay | Admitting: Family Medicine

## 2010-04-28 ENCOUNTER — Other Ambulatory Visit: Payer: Self-pay | Admitting: Family Medicine

## 2010-04-28 DIAGNOSIS — Z139 Encounter for screening, unspecified: Secondary | ICD-10-CM

## 2010-07-22 ENCOUNTER — Ambulatory Visit
Admission: RE | Admit: 2010-07-22 | Discharge: 2010-07-22 | Disposition: A | Discharge status: Home / Self Care | Payer: Medicare Other | Source: Ambulatory Visit | Attending: Family Medicine | Admitting: Family Medicine

## 2010-07-22 DIAGNOSIS — Z139 Encounter for screening, unspecified: Secondary | ICD-10-CM

## 2010-08-01 NOTE — Discharge Summary (Signed)
NAME:  [name], [name] NO.:  [id number]   MEDICAL RECORD NO.:  [id number]          PATIENT TYPE:  INP   LOCATION:  3703                         FACILITY:  [facility]   PHYSICIAN:  Lyn Records, [name]   DATE OF BIRTH:  October 26, 1933   DATE OF ADMISSION:  01/06/2008  DATE OF DISCHARGE:  01/07/2008                               DISCHARGE SUMMARY   DISCHARGE DIAGNOSES:  1. Chest pain, negative cardiac isoenzymes, resolved.  2. Hypercholesterolemia.  3. Hypertension.  4. Known coronary artery disease, history of coronary artery bypass      graft in 2000.  5. Long-term medication use.   Yvonne Butler is a 77 year old female with known coronary artery disease  who underwent bypass surgery in the year 2000.  She had a Cardiolite 2  years ago that was normal.  She woke up on the day of admission with  left arm pain radiating to the left shoulder and a pounding sensation in  the chest.  She denies any chest pain, jaw pain, or indigestion.  She  admitted she is under a lot of stress, her husband is in the hospital.   She was kept in the hospital overnight and monitored.  Cardiac enzymes  were obtained and she ruled out for myocardial infarction.   CBC showed hemoglobin 12.0, hematocrit 35.1, platelets 262, white count  8.0.  TSH 0.752.  Sodium 142, potassium 3.5, BUN 8, creatinine 0.73,  magnesium 2.3.  PT 12.2, INR 0.9.  Chest x-ray:  Surgical changes noted  from bypass surgery.  Mild COPD changes.  No other acute findings.   She was watched overnight in the hospital, and it was felt that it was  safe for her to go home the following day.  Please note her EKG shows  normal sinus rhythm.  Rate is 75 with nonspecific ST-T wave changes in  the inferolateral leads.   DISCHARGE MEDICATIONS:  1. Vitamin D every other week.  2. Lisinopril plus hydrochlorothiazide one a day as prior to      admission.  3. Enteric-coated aspirin 325 mg a day.  4. Metoprolol 25 mg twice a day.  5. Fish oil daily sublingual.  6. Nitroglycerin p.r.n. chest pain.   The patient is to remain on low-sodium heart-healthy diet.  Increase  activity slowly.  Office will call with a followup appointment.      Guy Franco, P.A.      Lyn Records, [name]  Electronically Signed    LB/MEDQ  D:  02/03/2008  T:  02/04/2008  Job:  [id number]   cc:   Duncan Dull, [name]

## 2010-12-16 LAB — DIFFERENTIAL
Basophils Absolute: 0
Basophils Relative: 1
Eosinophils Absolute: 0.1
Eosinophils Relative: 2
Lymphocytes Relative: 16
Lymphs Abs: 1.3
Monocytes Absolute: 0.5
Monocytes Relative: 6
Neutro Abs: 5.9
Neutrophils Relative %: 76

## 2010-12-16 LAB — CBC
HCT: 35.1 — ABNORMAL LOW
HCT: 39.1
Hemoglobin: 12
Hemoglobin: 13.1
MCHC: 33.4
MCHC: 34.2
MCV: 97.1
MCV: 97.8
Platelets: 262
Platelets: 289
RBC: 3.61 — ABNORMAL LOW
RBC: 4
RDW: 13.8
RDW: 13.9
WBC: 7.8
WBC: 8

## 2010-12-16 LAB — COMPREHENSIVE METABOLIC PANEL
ALT: 20
ALT: 22
AST: 19
AST: 25
Albumin: 3 — ABNORMAL LOW
Albumin: 3.4 — ABNORMAL LOW
Alkaline Phosphatase: 50
Alkaline Phosphatase: 56
BUN: 8
BUN: 9
CO2: 27
CO2: 29
Calcium: 8.7
Calcium: 9.2
Chloride: 104
Chloride: 108
Creatinine, Ser: 0.73
Creatinine, Ser: 0.76
GFR calc Af Amer: >60
GFR calc Af Amer: >60
GFR calc non Af Amer: >60
GFR calc non Af Amer: >60
Glucose, Bld: 109 — ABNORMAL HIGH
Glucose, Bld: 121 — ABNORMAL HIGH
Potassium: 3.5
Potassium: 4.1
Sodium: 141
Sodium: 142
Total Bilirubin: 0.4
Total Bilirubin: 0.9
Total Protein: 5.3 — ABNORMAL LOW
Total Protein: 6

## 2010-12-16 LAB — PROTIME-INR
INR: 0.9
Prothrombin Time: 12.2

## 2010-12-16 LAB — TSH: TSH: 0.752

## 2010-12-16 LAB — BASIC METABOLIC PANEL
BUN: 9
CO2: 28
Calcium: 9.1
Chloride: 105
Creatinine, Ser: 0.74
GFR calc Af Amer: >60
GFR calc non Af Amer: >60
Glucose, Bld: 129 — ABNORMAL HIGH
Potassium: 4.1
Sodium: 140

## 2010-12-16 LAB — CARDIAC PANEL(CRET KIN+CKTOT+MB+TROPI)
CK, MB: 1.7
CK, MB: 2
Relative Index: INVALID
Relative Index: INVALID
Total CK: 46
Total CK: 69
Troponin I: <0.01
Troponin I: <0.01

## 2010-12-16 LAB — POCT CARDIAC MARKERS
CKMB, poc: 1.1
Myoglobin, poc: 59.8
Troponin i, poc: <0.05

## 2010-12-16 LAB — APTT: aPTT: 21 — ABNORMAL LOW

## 2010-12-16 LAB — MAGNESIUM: Magnesium: 2.3

## 2011-06-19 ENCOUNTER — Other Ambulatory Visit: Payer: Self-pay | Admitting: Internal Medicine

## 2011-06-19 DIAGNOSIS — Z1231 Encounter for screening mammogram for malignant neoplasm of breast: Secondary | ICD-10-CM

## 2011-07-21 ENCOUNTER — Ambulatory Visit
Admission: RE | Admit: 2011-07-21 | Discharge: 2011-07-21 | Disposition: A | Discharge status: Home / Self Care | Payer: Medicare Other | Source: Ambulatory Visit | Attending: Internal Medicine | Admitting: Internal Medicine

## 2011-07-21 DIAGNOSIS — Z1231 Encounter for screening mammogram for malignant neoplasm of breast: Secondary | ICD-10-CM

## 2011-09-24 ENCOUNTER — Other Ambulatory Visit: Payer: Self-pay | Admitting: Internal Medicine

## 2011-09-24 DIAGNOSIS — Z1231 Encounter for screening mammogram for malignant neoplasm of breast: Secondary | ICD-10-CM

## 2011-09-24 DIAGNOSIS — M810 Age-related osteoporosis without current pathological fracture: Secondary | ICD-10-CM

## 2011-10-06 ENCOUNTER — Ambulatory Visit
Admission: RE | Admit: 2011-10-06 | Discharge: 2011-10-06 | Disposition: A | Discharge status: Home / Self Care | Payer: Medicare Other | Source: Ambulatory Visit | Attending: Internal Medicine | Admitting: Internal Medicine

## 2011-10-06 DIAGNOSIS — M810 Age-related osteoporosis without current pathological fracture: Secondary | ICD-10-CM

## 2011-10-14 ENCOUNTER — Ambulatory Visit: Payer: Medicare Other

## 2012-05-04 LAB — CBC W/O DIFF
HCT: 37.2 % (ref 37.0–50.0)
HGB: 12.9 gm/dl (ref 12.4–17.2)
MCH: 34.8 pg — ABNORMAL HIGH (ref 25.4–34.6)
MCHC: 34.6 gm/dl (ref 30.0–36.0)
MCV: 100.6 fL — ABNORMAL HIGH (ref 80.0–98.0)
MPV: 8 fL (ref 6.0–10.0)
PLATELET: 196 10*3/uL (ref 140–450)
RBC: 3.7 M/uL (ref 3.60–5.20)
RDW: 13.4 % (ref 11.5–14.0)
WBC: 5.8 10*3/uL (ref 4.0–11.0)

## 2012-05-04 LAB — METABOLIC PANEL, BASIC
BUN: 15 mg/dl (ref 7–25)
CO2: 25 mEq/L (ref 21–32)
Calcium: 9.2 mg/dl (ref 8.5–10.1)
Chloride: 108 mEq/L — ABNORMAL HIGH (ref 98–107)
Creatinine: 0.6 mg/dl (ref 0.6–1.3)
GFR est AA: >60
GFR est non-AA: >60
Glucose: 110 mg/dl — ABNORMAL HIGH (ref 74–106)
Potassium: 3.6 mEq/L (ref 3.5–5.1)
Sodium: 140 mEq/L (ref 136–145)

## 2012-05-04 NOTE — Consults (Addendum)
[facility]  CONSULTATION REPORT  NAME:  [name] [name]  SEX:   F  ADMIT: 05/19/2012  DATE OF CONSULT:   REFERRING PHYSICIAN:    DOB: 03-Mar-1934  MR#    [id number]  ROOM:    ACCT#  [id number]        CHIEF COMPLAINT:  My hip is bad.    HISTORY OF PRESENT ILLNESS:  This 79 year old white female is scheduled by Dr. Sherlon Handing to have elective left   hip replacement.  She has previously had her right hip replaced.  She is well   known to me.  She has hemochromatosis, rheumatoid arthritis and pseudogout.    For the past week and a half she has been off her Remicade and methotrexate in   preparation for upcoming surgery.  She has had a successful right hip   replacement and has done well with this.  She has reached the point where she   has pain that is 10 out of 10 requiring oxycodone to control her left hip   pain.  Medical consult was requested for preoperative evaluation.    She is hypertensive, but has never had a heart attack and does not have chest   pain, shortness of breath, palpitations, orthopnea or edema.    PAST HISTORY:  1.  Hemochromatosis.  2.  Rheumatoid arthritis.  3.  Pseudogout.  4.  Osteoarthritis.  5.  Positive PPD treated with INH.  6.  Chronic pain.  7.  Hypertension.    ALLERGIES:  SULFA.    REVIEW OF SYSTEMS:  CONSTITUTIONAL:  Reveals elevated weight.  PULMONARY:  No wheezing or coughing.    PHYSICAL EXAMINATION:  VITAL SIGNS:  Blood pressure today is 126/70, pulse 75, respiratory rate 17,   no distress.  NECK:  Symmetric, supple.  Thyroid not enlarged.  LUNGS:  Clear to A&P.  BREASTS:  Symmetric without mass.  HEART:  PMI nondisplaced murmur or gallop.  ABDOMEN:  Protuberant, nontender without organomegaly.  GENITALIA:  Not examined.  RECTAL:  Not examined.  EXTREMITIES:  No clubbing, cyanosis or edema.  NEUROLOGIC:  Gait is exquisitely slow.  The patient walks with a limp.  She   has decreased reflexes and decreased sensation.  LYMPH NODES:  None present in neck, axillary or groin.     SUMMARY OF LABORATORIES:  Labs are not available, but we will be forwarded to me.    IMPRESSIONS:  1.  Hypertension.  2.  Hemochromatosis.  3.  Rheumatoid arthritis.  4.  Degenerative joint disease.  5.  Immune compromised status.    PLAN:  The patient is a class 3 anesthetic risk.  I clear her with this risk for   operative left hip replacement surgery.  This is all pending her lab work   which is not yet available to me.  She should take her medications with a sip   of water the day prior to surgery, which include Synthroid 0.15 mg daily,   lisinopril 20 mg daily and HCTZ.  She has been off methotrexate and Remicade   for the past 10 days and should be off for 2 weeks prior to surgery and 2   weeks afterwards.  I would suggest that she have a rehab situation afterwards.    She has a husband who cannot help her.  My suggestion would be [facility]   since I can personally go there and care for her whereas I cannot in [place] where she  claims you were planning on sending her.      Thank you very much for the opportunity to care for this charming young lady.    I will clear her as a class 3 anesthetic risk pending receipt of her lab   work.      ___________________  Gracy Racer MD  Dictated By:.   SC  D:05/04/2012  T: 05/04/2012 16:19:23  [id number]  Authenticated by Gracy Racer, [name] On 05/08/2012 08:52:45 PM

## 2012-05-12 LAB — ANTIBODY SCREEN: Antibody screen: NEGATIVE

## 2012-05-12 LAB — BLOOD TYPE, (ABO+RH)
ABO/Rh(D): O POS
ABO/Rh: O POS

## 2012-05-19 LAB — GLUCOSE, POC: Glucose (POC): 217 — ABNORMAL HIGH (ref 65–105)

## 2012-05-19 LAB — HEMOGLOBIN A1C WITH EAG: Hemoglobin A1c: 5.3 % (ref 4.8–6.0)

## 2012-05-19 NOTE — Op Note (Signed)
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    SURGEON:   Dr. Sherlon Handing    ASSISTANT:  Sifen and Yeshtoykin    ANESTHESIA:  General.    ESTIMATED BLOOD LOSS:  Approximately 200.      DRAINS:  J-Vac.  Uncemented technique femur and acetabulum.    PROSTHESIS:  Femur a size 12, trabecular metal, Zimmer femur, head 32 mm in diameter, neck   minus 3.5, extended offset acetabulum 50 mm, 10-degree lip liner placed   posteriorly.       DESCRIPTION OF PROCEDURE:   After anesthesia was induced a Foley catheter was placed.    The patient was positioned in the right lateral decubitus position.    The left hip and leg were sterilely draped in the usual fashion.    An incision was made in a line from a position 4 cm posterior to the Anterior   Superior Iliac Spine obliquely over the greater trochanter.    The iliotibial band was exposed and the posterior edge of the tensor fascia   femoris noted.    The iliotibial band was split in the line of its fibers just behind the TFF.    The interval between TFF and the gluteus medius and minimus was bluntly   dissected, and a cobra retractor was placed about the posterior femoral neck.    The anterior inferior extra capsular space around the femoral neck was   developed and a retractor placed.    The nerve and vessel to the TFF was identified and protected.    The reflected head of the rectus femoris was traced to the pelvis and   released.    A  Z capsulotomy was performed exposing the head and neck of the femur and the   acetabulum.    The capsule was released along the posterior acetabulum and the tip and    posterior aspect of the greater trochanter.    The hip was externally rotated and the initial subcapital osteotomy of the   femoral neck was performed at the edge of the acetabulum.    The femur was then delivered anteriorly, the leg externally rotated and   extended and placed in the sterile pouch.    The definitive femoral neck osteotomy was performed.    The leg was then brought out of the pouch, traction was placed and the   acetabulum was exposed.    The labrum was excised, the fovea debrided. Rim osteophytes were removed.    The C Arm was introduced, the table was tilted to insure the pelvis was   orthogonal to the floor by equalizing the obturator foramen size on the AP   View.    Acetabular reaming was accomplished to 50 mm under fluoroscopic control.    The 50-mm acetabular trial was placed and aligned fluoroscopically.    The acetabulum was irrigated with pulsatile lavage, and the 50-mm acetabulum   was placed under fluoroscopic control. This was quite firmly fixed and filled   the prepared acetabulum.    The dome  hole was drilled and a 25 mm screw placed.    A 10 mm lipped cup was placed the lip (anterior / posterior ).    The femur was then exposed as previously described.    Femoral preparation was accomplished using the appropriate rasps and broaches   up to size 12 for the trabecular metal femur.      The calcar reamer was used to plane the femoral neck.    A trial head and neck 32 mm minus 3.5 was placed and the hip reduced.    Extended offset was placed.      Stability was checked in flexion to 90, abduction 0, internal rotation to 50   degrees without luxation, adduction, extension and external rotation to 40   degrees.    Fluoroscopy demonstrated appropriate leg length, using the relationship of the   lesser trochanter to the ischium in neutral ab/adduction to gauge leg length.    Note that the patient had a severe flexion contracture preoperatively.  The   reflected head of the rectus femoris  was released.  The rectus femoris was   released from the anterior pelvis.      The psoas was allowed to remain intact.  This corrected her flexion deformity.        The hip was then dislocated, the trial removed, the femur washed with   pulsatile lavage and the final prosthesis of the previously mentioned   dimensions placed.    The hip was reduced, stability confirmed, fluoroscopy used to confirm fit,   fill, orientation and leg length parameters.    The AquaMantis was used to provide hemostasis.    Lavage was performed.    The capsule and reflected head of the rectus femoris were repaired.    Drains were placed.    The IT band and interval between TFF and GM was closed.    The wound was closed in layers.    The limb was supported in thigh to groin ace bandage and abduction pillow with   sequential calf pumps.    She tolerated the procedure and left the OR in satisfactory condition.      ___________________  Jones Skene MD  Dictated By:.   SC  D:05/19/2012  T: 05/20/2012 08:17:16  [id number]  Authenticated by Elmer Sow. Sherlon Handing, [name] On 05/20/2012 12:55:56 PM

## 2012-05-20 LAB — GLUCOSE, POC
Glucose (POC): 280 — ABNORMAL HIGH (ref 65–105)
Glucose (POC): 127 — ABNORMAL HIGH (ref 65–105)
Glucose (POC): 134 — ABNORMAL HIGH (ref 65–105)
Glucose (POC): 147 — ABNORMAL HIGH (ref 65–105)

## 2012-05-20 LAB — HGB & HCT
HCT: 23 % — ABNORMAL LOW (ref 37.0–50.0)
HGB: 8.1 gm/dl — ABNORMAL LOW (ref 12.4–17.2)

## 2012-05-20 NOTE — Op Note (Signed)
[facility]  Inpatient Operation Report  NAME:  [name] [name], [name] [name]  SEX:   F  DATE: 05/19/2012  DOB: Jun 23, 1933  MR#    [id number]  ROOM:  2120  ACCT#  [id number]    cc: Rosebud Poles MD    INDICATIONS FOR SURGERY, PRE- AND POSTOPERATIVE DIAGNOSIS:  End-stage arthritis of the left hip with a flexion contracture   (osteoarthritis) interfering with ADL's and sleep pattern, no longer   responsive to medical management.    SURGEON:   Dr. Sherlon Handing    ASSISTANT:  Sifen and Yeshtoykin    ANESTHESIA:  General.    ESTIMATED BLOOD LOSS:  Approximately 200.      DRAINS:  J-Vac.  Uncemented technique femur and acetabulum.    PROSTHESIS:  Femur a size 12, trabecular metal, Zimmer femur, head 32 mm in diameter, neck   minus 3.5, extended offset acetabulum 50 mm, 10-degree lip liner placed   posteriorly.       DESCRIPTION OF PROCEDURE:   After anesthesia was induced a Foley catheter was placed.    The patient was positioned in the right lateral decubitus position.    The left hip and leg were sterilely draped in the usual fashion.    An incision was made in a line from a position 4 cm posterior to the Anterior   Superior Iliac Spine obliquely over the greater trochanter.    The iliotibial band was exposed and the posterior edge of the tensor fascia   femoris noted.    The iliotibial band was split in the line of its fibers just behind the TFF.    The interval between TFF and the gluteus medius and minimus was bluntly   dissected, and a cobra retractor was placed about the posterior femoral neck.    The anterior inferior extra capsular space around the femoral neck was   developed and a retractor placed.    The nerve and vessel to the TFF was identified and protected.    The reflected head of the rectus femoris was traced to the pelvis and   released.    A  Z capsulotomy was performed exposing the head and neck of the femur and the   acetabulum.    The capsule was released along the posterior acetabulum and the tip and    posterior aspect of the greater trochanter.    The hip was externally rotated and the initial subcapital osteotomy of the   femoral neck was performed at the edge of the acetabulum.    The femur was then delivered anteriorly, the leg externally rotated and   extended and placed in the sterile pouch.    The definitive femoral neck osteotomy was performed.    The leg was then brought out of the pouch, traction was placed and the   acetabulum was exposed.    The labrum was excised, the fovea debrided. Rim osteophytes were removed.    The C Arm was introduced, the table was tilted to insure the pelvis was   orthogonal to the floor by equalizing the obturator foramen size on the AP   View.    Acetabular reaming was accomplished to 50 mm under fluoroscopic control.    The 50-mm acetabular trial was placed and aligned fluoroscopically.    The acetabulum was irrigated with pulsatile lavage, and the 50-mm acetabulum   was placed under fluoroscopic control. This was quite firmly fixed and filled   the prepared acetabulum.    The dome  hole was drilled and a 25 mm screw placed.    A 10 mm lipped cup was placed the lip (anterior / posterior ).    The femur was then exposed as previously described.    Femoral preparation was accomplished using the appropriate rasps and broaches   up to size 12 for the trabecular metal femur.      The calcar reamer was used to plane the femoral neck.    A trial head and neck 32 mm minus 3.5 was placed and the hip reduced.    Extended offset was placed.      Stability was checked in flexion to 90, abduction 0, internal rotation to 50   degrees without luxation, adduction, extension and external rotation to 40   degrees.    Fluoroscopy demonstrated appropriate leg length, using the relationship of the   lesser trochanter to the ischium in neutral ab/adduction to gauge leg length.    Note that the patient had a severe flexion contracture preoperatively.  The    reflected head of the rectus femoris was released.  The rectus femoris was   released from the anterior pelvis.      The psoas was allowed to remain intact.  This corrected her flexion deformity.        The hip was then dislocated, the trial removed, the femur washed with   pulsatile lavage and the final prosthesis of the previously mentioned   dimensions placed.    The hip was reduced, stability confirmed, fluoroscopy used to confirm fit,   fill, orientation and leg length parameters.    The AquaMantis was used to provide hemostasis.    Lavage was performed.    The capsule and reflected head of the rectus femoris were repaired.    Drains were placed.    The IT band and interval between TFF and GM was closed.    The wound was closed in layers.    The limb was supported in thigh to groin ace bandage and abduction pillow with   sequential calf pumps.    She tolerated the procedure and left the OR in satisfactory condition.      ___________________  Jones Skene MD  Dictated By:.   SC  D:05/19/2012  T: 05/20/2012 08:17:16  [id number]  Authenticated by Elmer Sow. Sherlon Handing, [name] On 05/20/2012 12:55:56 PM

## 2012-05-20 NOTE — Progress Notes (Signed)
[facility]  Oncology Progress Note  NAME:  [name] [name], [name] [name]  SEX:   F  DOB:   [birth year]  DATE: 05/20/2012  REFERRING PHYSICIAN:    MR#    [id number]  LOCATION: RADIATION ONCOLOGY  BILLING#  [id number]    cc:     The patient was seen earlier today as an inpatient for consideration of  single-dose radiation therapy to the left hip following total hip replacement  yesterday.  She was brought to the radiation therapy department where  appropriate measurements were taken for dosimetric analysis.  Computer  calculations were quickly done and a single fraction of 700 rads was delivered  to the left hip through AP/PA high-energy fields.  She was then transferred   back to her hospital bed and brought to her room in excellent condition.  She   will continue to follow up with Dr. Sherlon Handing as previously planned.      ___________________  Maryruth Hancock MD  Dictated By: .   Nonie Hoyer  D:05/20/2012  T: 05/20/2012  [id number]  Authenticated and Edited by Maryruth Hancock, [name] On 05/20/12 3:52:04 PM

## 2012-05-20 NOTE — Consults (Signed)
[facility]  Oncology Consultation  NAME:  [name] [name], [name] [name]  SEX:   F  DOB:   02/22/1934  DATE: 05/20/2012  REFERRING PHYSICIAN:    MR#    [id number]  LOCATION: RADIATION ONCOLOGY  BILLING#  [id number]    cc: Jones Skene MD    REFERRING PHYSICIAN:  Jones Skene, MD     DIAGNOSIS:  Left total hip replacement with radiation for prevention of heterotopic bone   formation.    HISTORY OF PRESENT ILLNESS:  The patient is a 79 year old white female who has a history of previous right   total hip replacement, who yesterday underwent left total hip replacement by   Dr. Sherlon Handing for degenerative changes.  She is referred now for consideration of   single-fraction low-dose radiation to the left hip to prevent heterotopic   bone formation.    PAST MEDICAL HISTORY:  Previous right hip replacement a few years ago as noted above.  This has   healed very well and she has no problems with it.  Also history of   hypertension.  Negative for history of radiation or chemotherapy, although she   was taking methotrexate and Remicade, currently on hold during surgery, for   blood disorder.  Previous hysterectomy.    CURRENT MEDICATIONS:  Synthroid, hydrochlorothiazide, lisinopril, calcium supplement, OxyContin as   needed for pain, methotrexate and Remicade, again on hold during surgery.    ALLERGIES:  SULFA DRUGS.    SOCIAL HISTORY:  Positive for cigarette smoking, which she quit 40 years ago.  Negative alcohol   use.  The patient denies previous hazardous occupational exposure.    REVIEW OF SYSTEMS:  The patient is a bit fatigued after surgery.  Denies other constitutional   symptoms.  Denies visual problems.  Denies hearing or swallowing problems.    Denies respiratory problems.  Denies cardiac problems.   GASTROINTESTINAL:  Complains of occasional constipation.  MUSCULOSKELETAL:  Complaints with discomfort in the recently operated left hip   area.    Denies neurologic problems.  Denies psychiatric problem.     PAIN AND PAIN MANAGEMENT:  The patient is currently taking pain medication as an inpatient, but also   takes oral medication as an outpatient as needed.    PHYSICAL EXAMINATION:  GENERAL:  The patient is a well-developed, well-nourished adult white female   in no apparent distress.  Examination is limited as she is bedbound today.  HEENT:  No evidence of scleral icterus.    NECK:  No adenopathy in the neck or supraclavicular regions bilaterally.  CHEST:  Good breath sounds bilaterally.  HEART:  Regular rate and rhythm.  ABDOMEN:  Soft, nontender.  Old surgical  scar noted in the right hip from   right hip surgery a few years ago.  Fresh surgical site in the left hip with   bandaging in place.  EXTREMITIES:  No clubbing, cyanosis or edema.  NEUROLOGIC:  Grossly intact.  The patient is awake, alert and oriented times 3   with no focal, motor or sensory deficits.    LABORATORY STUDIES:  As per referring physician.      REVIEW OF PATHOLOGY:  No pathology available with benign disease.    STAGE OF TUMOR:  Staging not applicable with benign disease.    ASSESSMENT:  The patient is a 79 year old woman who is approximately 24 hours status post   left total hip replacement.  I will now deliver single-fraction low-dose   radiation therapy to the surgical bed  in the left hip in an effort to prevent   heterotopic bone formation.  The risks and benefits of low-dose radiation were   discussed in detail with the patient and her daughter (who was in attendance   and was sleeping in a cot by the patient's bedside).  She appears to fully   understand the situation and fully consents to radiation therapy as outlined.    I thank Dr. Sherlon Handing for allowing [place] to participate in the care of this very   pleasant woman.      ___________________  Maryruth Hancock MD  Dictated By: .   Nonie Hoyer  D:05/20/2012  T: 05/20/2012  [id number]  Authenticated by Maryruth Hancock, [name] On 05/20/2012 03:09:17 PM

## 2012-05-21 LAB — RBC, 2 UNIT CROSSMATCH
Blood type code: O POS
Blood type code: O POS
Issue date/time: 201403071643
Issue date/time: 201403072058
Status info.: TRANSFUSED
Status info.: TRANSFUSED

## 2012-05-21 LAB — HGB & HCT
HCT: 30.1 % — ABNORMAL LOW (ref 37.0–50.0)
HGB: 10.7 gm/dl — ABNORMAL LOW (ref 12.4–17.2)

## 2012-05-21 LAB — GLUCOSE, POC
Glucose (POC): 148 — ABNORMAL HIGH (ref 65–105)
Glucose (POC): 129 — ABNORMAL HIGH (ref 65–105)
Glucose (POC): 151 — ABNORMAL HIGH (ref 65–105)
Glucose (POC): 159 — ABNORMAL HIGH (ref 65–105)

## 2012-05-21 NOTE — Discharge Summary (Signed)
[facility]  Oncology Completion Report  NAME: [name] [name], [name] [name]  MR#    [id number]  QIONGEX#[id number]  SEX F  PT. LOCATION: RADIATION ONCOLOGY  DOB: November 24, 1933  CCLIST: cc: Jones Skene MD  TREATMENT INITIATED: 05/19/2012  TREAMENT COMPLETED:     REFERRING PHYSICIAN:  Jones Skene, MD    DIAGNOSIS:  Left total hip replacement with radiation to prevent heterotopic bone   formation.    DATE TREATMENT STARTED:  05/20/2012.    DATE TREATMENT COMPLETED:  05/20/2012.    SOURCE OF RADIATION:  External beam 15 MV photons.    FIELD DESCRIPTION:  Treatment field covered the surgical bed in the left hip including the   orthopedic appliance.      DOSE:  700 rads tumor dose in 1 fraction through AP-PA fields.    REMARKS:  The patient tolerated her single-fraction, low-dose radiation treatment to   prevent heterotopic bone formation following total hip replacement quite well.    She had no problems being brought down to the radiation department in her   hospital bed, transferring to the table, having the treatment, and then   transferring back to her bed.  This was all discussed in detail prior to   treatment with the patient and her family member.  She was transferred back to   her hospital room in excellent condition where she will continue to follow up   with Dr. Sherlon Handing as planned.    I thank Dr. Sherlon Handing for allowing me to have participated in the care of this   very pleasant woman.      ___________________  Maryruth Hancock MD  Dictated By: .   KB  D:05/20/2012  T: 05/21/2012 02:56:16  [id number]  Authenticated by Maryruth Hancock, [name] On 06/01/2012 04:38:00 PM

## 2012-05-22 LAB — GLUCOSE, POC
Glucose (POC): 99 (ref 65–105)
Glucose (POC): 96 (ref 65–105)
Glucose (POC): 125 — ABNORMAL HIGH (ref 65–105)

## 2012-05-22 LAB — HGB & HCT
HCT: 27.7 % — ABNORMAL LOW (ref 37.0–50.0)
HGB: 9.5 gm/dl — ABNORMAL LOW (ref 12.4–17.2)

## 2012-05-22 NOTE — Discharge Summary (Signed)
[facility]  Discharge Summary   NAME:  [name] [name], [name] [name]  SEX:   F  ADMIT: 05/19/2012  DISCH:   DOB:   1934/02/22  MR#    [id number]  ACCT#  [id number]    cc: Gracy Racer MD    DATE OF ADMISSION:   05/19/2012    DATE OF DISCHARGE:   05/22/2012    The patient was admitted to the hospital for elective left total hip   arthroplasty.    She had previously had an anterior lateral approach right total hip   arthroplasty which she did well.     She has the base of her problems hemochromatosis, rheumatoid arthritis and   pseudogout.    She had previously been on Remicade and methotrexate.  She has a left hip   flexion contracture as well as her end-stage arthritis.    Her medications on admission were Synthroid, lisinopril and   hydrochlorothiazide.    She was taken to surgery on the day of admission where she had a left total   hip arthroplasty done through a muscle splitting anterolateral approach.  A   size 12 trabecular metal femur, 32 mm head, -3.5 neck length.    She had a 50 mm trabecular metal acetabulum 10 degree lip liner placed   posteriorly.  She had release of the anterior capsule, the rectus femoris and   sartorius to release her flexion contracture, the psoas tendon was left   intact.    Her postoperative course was not complicated.    She was started on Lovenox after Dextran.    Dr. Billy Coast attended for her medical management.    She developed a postoperative anemia with some orthostasis which was treated   by transfusion of 2 units of packed red blood cells.  This alleviated the   orthostasis.    Her drains were removed on postoperative day #2.  She was ambulating partial   weightbearing.    When she goes to the nursing facility she will continue on all her home   medications, add Lovenox 30 mg subcutaneously every 12 hours.  Physical   therapy should be attended to by ambulation, partial weightbearing with   anterior hip precautions.     She should have special attention to her hip flexion contracture which should   be treated by stretching exercises in bed.    Her sutures should be removed in approximately 2 weeks at which time she will   be seen in our office.      ___________________  Jones Skene MD  Dictated By: .   Davonna Belling  D:05/22/2012  T: 05/22/2012 12:26:29  [id number]  Authenticated by Elmer Sow. Sherlon Handing, [name] On 05/27/2012 08:02:17 AM

## 2012-08-12 ENCOUNTER — Ambulatory Visit
Admission: RE | Admit: 2012-08-12 | Discharge: 2012-08-12 | Disposition: A | Discharge status: Home / Self Care | Payer: Medicare Other | Source: Ambulatory Visit | Attending: Internal Medicine | Admitting: Internal Medicine

## 2012-08-12 ENCOUNTER — Other Ambulatory Visit: Payer: Self-pay | Admitting: Internal Medicine

## 2012-08-12 DIAGNOSIS — R413 Other amnesia: Secondary | ICD-10-CM

## 2012-12-26 ENCOUNTER — Encounter (INDEPENDENT_AMBULATORY_CARE_PROVIDER_SITE_OTHER): Payer: Medicare Other | Admitting: Ophthalmology

## 2012-12-26 DIAGNOSIS — H353290 Exudative age-related macular degeneration, unspecified eye, stage unspecified: Secondary | ICD-10-CM

## 2012-12-26 DIAGNOSIS — H3530 Unspecified macular degeneration: Secondary | ICD-10-CM

## 2012-12-26 DIAGNOSIS — H43819 Vitreous degeneration, unspecified eye: Secondary | ICD-10-CM

## 2012-12-31 ENCOUNTER — Encounter: Payer: Self-pay | Admitting: *Deleted

## 2012-12-31 ENCOUNTER — Encounter: Payer: Self-pay | Admitting: Interventional Cardiology

## 2013-01-03 ENCOUNTER — Encounter: Payer: Self-pay | Admitting: Interventional Cardiology

## 2013-01-03 ENCOUNTER — Ambulatory Visit (INDEPENDENT_AMBULATORY_CARE_PROVIDER_SITE_OTHER): Payer: Medicare Other | Admitting: Interventional Cardiology

## 2013-01-03 VITALS — BP 148/78 | HR 65 | Ht 64.0 in | Wt 162.0 lb

## 2013-01-03 DIAGNOSIS — I10 Essential (primary) hypertension: Secondary | ICD-10-CM

## 2013-01-03 DIAGNOSIS — I25810 Atherosclerosis of coronary artery bypass graft(s) without angina pectoris: Secondary | ICD-10-CM

## 2013-01-03 DIAGNOSIS — E785 Hyperlipidemia, unspecified: Secondary | ICD-10-CM | POA: Insufficient documentation

## 2013-01-03 DIAGNOSIS — E1110 Type 2 diabetes mellitus with ketoacidosis without coma: Secondary | ICD-10-CM | POA: Insufficient documentation

## 2013-01-03 NOTE — Patient Instructions (Signed)
Your physician recommends that you continue on your current medications as directed. Please refer to the Current Medication list given to you today.  Your physician wants you to follow-up in: 1 year. You will receive a reminder letter in the mail two months in advance. If you don't receive a letter, please call our office to schedule the follow-up appointment.  

## 2013-01-03 NOTE — Progress Notes (Signed)
Patient ID: AMEISHA MCCLELLAN, female   DOB: 09-Jun-1933, 79 y.o.   MRN: [id number]    [address], [address] [place], [place]  [address] Phone: [phone] Fax:  [phone]  Date:  01/03/2013   ID:  KATRIANA DORTCH, DOB 01-14-1934, MRN [id number]  PCP:  No primary provider on file.   ASSESSMENT:  1. Coronary atherosclerotic heart disease, stable without angina.  2. Hypertension  3. Hyperlipidemia  4. Diabetes mellitus  PLAN:  1. Clinical followup in one year  2. Increase physical activity.  3. No change in therapy   SUBJECTIVE: RITAL CAVEY is a 79 y.o. female who is having no cardiovascular complaints. Her major concerns or memory loss and decreased vision. She has not used nitroglycerin. She denies palpitations and syncope. Is no peripheral edema, orthopnea, or PND. She denies transient neurological complaints. No medication side effects.   Wt Readings from Last 3 Encounters:  01/03/13 162 lb (73.483 kg)     Past Medical History  Diagnosis Date  . CAD (coronary artery disease)     with CABG- Dr. Katrinka Blazing  . Hypercholesteremia   . Coronary atherosclerosis of native coronary artery   . Vitamin D deficiency   . Hyperlipidemia   . Osteopenia   . Diverticulosis   . Diabetes     Current Outpatient Prescriptions  Medication Sig Dispense Refill  . Choline Fenofibrate (FENOFIBRIC ACID) 45 MG CPDR Take 1 tablet by mouth daily.      . metoprolol tartrate (LOPRESSOR) 25 MG tablet Take 1 tablet by mouth daily.      [name] NAMENDA XR 28 MG CP24 Take 1 tablet by mouth daily.      . simvastatin (ZOCOR) 20 MG tablet Take 1 tablet by mouth daily.       No current facility-administered medications for this visit.    Allergies:   No Known Allergies  Social History:  The patient  reports that she has quit smoking. She does not have any smokeless tobacco history on file. She reports that she does not drink alcohol or use illicit drugs.   ROS:  Please see the history of  present illness.   Decreased vision, she feels related to macular degeneration. Decreased memory. No longer driving. Appetite and energy are normal.   All other systems reviewed and negative.   OBJECTIVE: VS:  BP 148/78  Pulse 65  Ht 5\' 4"  (1.626 m)  Wt 162 lb (73.483 kg)  BMI 27.79 kg/m2 Well nourished, well developed, in no acute distress HEENT: normal Neck: JVD flat. Carotid bruit none  Cardiac:  normal S1, S2; RRR; no murmur Lungs:  clear to auscultation bilaterally, no wheezing, rhonchi or rales Abd: soft, nontender, no hepatomegaly Ext: Edema none. Pulses 2+ dorsalis pedis bilaterally Skin: warm and dry Neuro:  CNs 2-12 intact, no focal abnormalities noted  EKG:  Normal sinus rhythm with nonspecific T-wave abnormality.       Signed, Darci Needle III, MD 01/03/2013 11:52 AM

## 2013-01-23 ENCOUNTER — Encounter (INDEPENDENT_AMBULATORY_CARE_PROVIDER_SITE_OTHER): Payer: Medicare Other | Admitting: Ophthalmology

## 2013-01-23 DIAGNOSIS — H43819 Vitreous degeneration, unspecified eye: Secondary | ICD-10-CM

## 2013-01-23 DIAGNOSIS — H353290 Exudative age-related macular degeneration, unspecified eye, stage unspecified: Secondary | ICD-10-CM

## 2013-02-10 ENCOUNTER — Other Ambulatory Visit: Payer: Self-pay

## 2013-02-10 MED ORDER — METOPROLOL TARTRATE 25 MG PO TABS: 25.0000 mg | ORAL_TABLET | Freq: Every day | ORAL | Status: DC

## 2013-02-19 ENCOUNTER — Inpatient Hospital Stay (HOSPITAL_COMMUNITY): Payer: Medicare Other

## 2013-02-19 ENCOUNTER — Encounter (HOSPITAL_COMMUNITY): Payer: Self-pay | Admitting: Emergency Medicine

## 2013-02-19 ENCOUNTER — Inpatient Hospital Stay (HOSPITAL_COMMUNITY): Payer: Medicare Other | Admitting: Anesthesiology

## 2013-02-19 ENCOUNTER — Encounter (HOSPITAL_COMMUNITY): Payer: Medicare Other | Admitting: Anesthesiology

## 2013-02-19 ENCOUNTER — Emergency Department (HOSPITAL_COMMUNITY): Payer: Medicare Other

## 2013-02-19 ENCOUNTER — Inpatient Hospital Stay (HOSPITAL_COMMUNITY)
Admission: EM | Admit: 2013-02-19 | Discharge: 2013-02-21 | DRG: 470 | Disposition: A | Discharge status: Skilled Nursing Facility | Payer: Medicare Other | Attending: Internal Medicine | Admitting: Internal Medicine

## 2013-02-19 ENCOUNTER — Encounter (HOSPITAL_COMMUNITY)
Admission: EM | Disposition: A | Discharge status: Skilled Nursing Facility | Payer: Self-pay | Source: Home / Self Care | Attending: Internal Medicine

## 2013-02-19 DIAGNOSIS — I10 Essential (primary) hypertension: Secondary | ICD-10-CM

## 2013-02-19 DIAGNOSIS — Z79899 Other long term (current) drug therapy: Secondary | ICD-10-CM

## 2013-02-19 DIAGNOSIS — M174 Other bilateral secondary osteoarthritis of knee: Secondary | ICD-10-CM

## 2013-02-19 DIAGNOSIS — S139XXA Sprain of joints and ligaments of unspecified parts of neck, initial encounter: Secondary | ICD-10-CM | POA: Diagnosis present

## 2013-02-19 DIAGNOSIS — Z87891 Personal history of nicotine dependence: Secondary | ICD-10-CM

## 2013-02-19 DIAGNOSIS — Y92009 Unspecified place in unspecified non-institutional (private) residence as the place of occurrence of the external cause: Secondary | ICD-10-CM

## 2013-02-19 DIAGNOSIS — IMO0001 Reserved for inherently not codable concepts without codable children: Secondary | ICD-10-CM

## 2013-02-19 DIAGNOSIS — M47812 Spondylosis without myelopathy or radiculopathy, cervical region: Secondary | ICD-10-CM | POA: Diagnosis present

## 2013-02-19 DIAGNOSIS — M175 Other unilateral secondary osteoarthritis of knee: Secondary | ICD-10-CM | POA: Diagnosis present

## 2013-02-19 DIAGNOSIS — I25810 Atherosclerosis of coronary artery bypass graft(s) without angina pectoris: Secondary | ICD-10-CM

## 2013-02-19 DIAGNOSIS — R413 Other amnesia: Secondary | ICD-10-CM

## 2013-02-19 DIAGNOSIS — W19XXXA Unspecified fall, initial encounter: Secondary | ICD-10-CM | POA: Diagnosis present

## 2013-02-19 DIAGNOSIS — E785 Hyperlipidemia, unspecified: Secondary | ICD-10-CM

## 2013-02-19 DIAGNOSIS — S72001A Fracture of unspecified part of neck of right femur, initial encounter for closed fracture: Secondary | ICD-10-CM

## 2013-02-19 DIAGNOSIS — I2510 Atherosclerotic heart disease of native coronary artery without angina pectoris: Secondary | ICD-10-CM | POA: Diagnosis present

## 2013-02-19 DIAGNOSIS — S72009A Fracture of unspecified part of neck of unspecified femur, initial encounter for closed fracture: Secondary | ICD-10-CM | POA: Insufficient documentation

## 2013-02-19 DIAGNOSIS — E559 Vitamin D deficiency, unspecified: Secondary | ICD-10-CM | POA: Diagnosis present

## 2013-02-19 DIAGNOSIS — Z7982 Long term (current) use of aspirin: Secondary | ICD-10-CM

## 2013-02-19 DIAGNOSIS — D72829 Elevated white blood cell count, unspecified: Secondary | ICD-10-CM

## 2013-02-19 DIAGNOSIS — E1110 Type 2 diabetes mellitus with ketoacidosis without coma: Secondary | ICD-10-CM | POA: Diagnosis present

## 2013-02-19 DIAGNOSIS — Z9089 Acquired absence of other organs: Secondary | ICD-10-CM

## 2013-02-19 HISTORY — DX: Adverse effect of unspecified anesthetic, initial encounter: T41.45XA

## 2013-02-19 HISTORY — DX: Other complications of anesthesia, initial encounter: T88.59XA

## 2013-02-19 HISTORY — DX: Other specified postprocedural states: R11.2

## 2013-02-19 HISTORY — DX: Other specified postprocedural states: Z98.890

## 2013-02-19 HISTORY — PX: HIP ARTHROPLASTY: SHX981

## 2013-02-19 LAB — BASIC METABOLIC PANEL
BUN: 18 mg/dL (ref 6–23)
Chloride: 101 mEq/L (ref 96–112)
Creatinine, Ser: 0.85 mg/dL (ref 0.50–1.10)
GFR calc Af Amer: 75 mL/min — ABNORMAL LOW (ref >90)
Glucose, Bld: 131 mg/dL — ABNORMAL HIGH (ref 70–99)
Potassium: 3.8 mEq/L (ref 3.5–5.1)

## 2013-02-19 LAB — URINALYSIS, ROUTINE W REFLEX MICROSCOPIC
Glucose, UA: NEGATIVE mg/dL
Hgb urine dipstick: NEGATIVE
Leukocytes, UA: NEGATIVE
Protein, ur: NEGATIVE mg/dL
Specific Gravity, Urine: 1.012 (ref 1.005–1.030)

## 2013-02-19 LAB — CBC WITH DIFFERENTIAL/PLATELET
Basophils Absolute: 0 10*3/uL (ref 0.0–0.1)
Basophils Relative: 0 % (ref 0–1)
Eosinophils Absolute: 0.2 10*3/uL (ref 0.0–0.7)
Eosinophils Relative: 2 % (ref 0–5)
HCT: 38.8 % (ref 36.0–46.0)
Hemoglobin: 13 g/dL (ref 12.0–15.0)
MCH: 32 pg (ref 26.0–34.0)
MCHC: 33.5 g/dL (ref 30.0–36.0)
MCV: 95.6 fL (ref 78.0–100.0)
Monocytes Absolute: 0.8 10*3/uL (ref 0.1–1.0)
Monocytes Relative: 7 % (ref 3–12)
Neutro Abs: 10.1 10*3/uL — ABNORMAL HIGH (ref 1.7–7.7)
RDW: 14.1 % (ref 11.5–15.5)

## 2013-02-19 LAB — GLUCOSE, CAPILLARY: Glucose-Capillary: 145 mg/dL — ABNORMAL HIGH (ref 70–99)

## 2013-02-19 LAB — SURGICAL PCR SCREEN
MRSA, PCR: NEGATIVE
Staphylococcus aureus: NEGATIVE

## 2013-02-19 LAB — TYPE AND SCREEN: ABO/RH(D): A POS

## 2013-02-19 SURGERY — HEMIARTHROPLASTY, HIP, DIRECT ANTERIOR APPROACH, FOR FRACTURE
Anesthesia: General | Site: Hip | Laterality: Right

## 2013-02-19 MED ORDER — ALBUMIN HUMAN 5 % IV SOLN
INTRAVENOUS | Status: DC | PRN
  Administered 2013-02-19: 19:00:00 via INTRAVENOUS

## 2013-02-19 MED ORDER — FENTANYL CITRATE 0.05 MG/ML IJ SOLN
25.0000 ug | INTRAMUSCULAR | Status: DC | PRN
  Administered 2013-02-19: 25 ug via INTRAVENOUS

## 2013-02-19 MED ORDER — ASPIRIN EC 325 MG PO TBEC
325.0000 mg | DELAYED_RELEASE_TABLET | Freq: Two times a day (BID) | ORAL | Status: DC
  Administered 2013-02-20 – 2013-02-21 (×3): 325 mg via ORAL
  Filled 2013-02-19 (×5): qty 1

## 2013-02-19 MED ORDER — METHOCARBAMOL 500 MG PO TABS: 500.0000 mg | ORAL_TABLET | Freq: Four times a day (QID) | ORAL | Status: DC | PRN

## 2013-02-19 MED ORDER — SODIUM CHLORIDE 0.9 % IR SOLN
Status: DC | PRN
  Administered 2013-02-19: 3000 mL

## 2013-02-19 MED ORDER — ARTIFICIAL TEARS OP OINT
TOPICAL_OINTMENT | OPHTHALMIC | Status: DC | PRN
  Administered 2013-02-19: 1 via OPHTHALMIC

## 2013-02-19 MED ORDER — FENTANYL CITRATE 0.05 MG/ML IJ SOLN
50.0000 ug | INTRAMUSCULAR | Status: AC | PRN
  Administered 2013-02-19 (×2): 50 ug via INTRAVENOUS
  Filled 2013-02-19 (×2): qty 2

## 2013-02-19 MED ORDER — HYDROCODONE-ACETAMINOPHEN 5-325 MG PO TABS: 1.0000 | ORAL_TABLET | Freq: Four times a day (QID) | ORAL | Status: DC | PRN

## 2013-02-19 MED ORDER — LIDOCAINE HCL (CARDIAC) 20 MG/ML IV SOLN
INTRAVENOUS | Status: DC | PRN
  Administered 2013-02-19: 80 mg via INTRAVENOUS

## 2013-02-19 MED ORDER — SODIUM CHLORIDE 0.9 % IV SOLN
1000.0000 mL | INTRAVENOUS | Status: DC
  Administered 2013-02-19 – 2013-02-20 (×4): 1000 mL via INTRAVENOUS

## 2013-02-19 MED ORDER — HYDRALAZINE HCL 20 MG/ML IJ SOLN
INTRAMUSCULAR | Status: AC
  Filled 2013-02-19: qty 1

## 2013-02-19 MED ORDER — HYDRALAZINE HCL 20 MG/ML IJ SOLN
10.0000 mg | Freq: Four times a day (QID) | INTRAMUSCULAR | Status: DC | PRN
  Administered 2013-02-19 – 2013-02-21 (×2): 10 mg via INTRAVENOUS
  Filled 2013-02-19: qty 1

## 2013-02-19 MED ORDER — ASPIRIN EC 325 MG PO TBEC: 325.0000 mg | DELAYED_RELEASE_TABLET | Freq: Two times a day (BID) | ORAL | Status: DC

## 2013-02-19 MED ORDER — INSULIN ASPART 100 UNIT/ML ~~LOC~~ SOLN
0.0000 [IU] | SUBCUTANEOUS | Status: DC
  Administered 2013-02-20 (×2): 2 [IU] via SUBCUTANEOUS
  Administered 2013-02-20: 1 [IU] via SUBCUTANEOUS
  Administered 2013-02-20: 5 [IU] via SUBCUTANEOUS
  Administered 2013-02-20: 2 [IU] via SUBCUTANEOUS
  Administered 2013-02-21 (×2): 1 [IU] via SUBCUTANEOUS
  Administered 2013-02-21: 2 [IU] via SUBCUTANEOUS
  Administered 2013-02-21: 1 [IU] via SUBCUTANEOUS

## 2013-02-19 MED ORDER — SIMVASTATIN 20 MG PO TABS
20.0000 mg | ORAL_TABLET | Freq: Every day | ORAL | Status: DC
  Administered 2013-02-20 – 2013-02-21 (×2): 20 mg via ORAL
  Filled 2013-02-19 (×2): qty 1

## 2013-02-19 MED ORDER — GLYCOPYRROLATE 0.2 MG/ML IJ SOLN
INTRAMUSCULAR | Status: DC | PRN
  Administered 2013-02-19: 0.4 mg via INTRAVENOUS

## 2013-02-19 MED ORDER — HYDROMORPHONE HCL PF 1 MG/ML IJ SOLN: 0.5000 mg | INTRAMUSCULAR | Status: DC | PRN

## 2013-02-19 MED ORDER — FENTANYL CITRATE 0.05 MG/ML IJ SOLN
100.0000 ug | Freq: Once | INTRAMUSCULAR | Status: AC
  Administered 2013-02-19: 100 ug via INTRAVENOUS
  Filled 2013-02-19: qty 2

## 2013-02-19 MED ORDER — FENOFIBRATE 54 MG PO TABS
54.0000 mg | ORAL_TABLET | Freq: Every day | ORAL | Status: DC
  Administered 2013-02-20 – 2013-02-21 (×2): 54 mg via ORAL
  Filled 2013-02-19 (×3): qty 1

## 2013-02-19 MED ORDER — CEFAZOLIN SODIUM-DEXTROSE 2-3 GM-% IV SOLR
2.0000 g | Freq: Once | INTRAVENOUS | Status: AC
  Administered 2013-02-19: 2 g via INTRAVENOUS

## 2013-02-19 MED ORDER — ONDANSETRON HCL 4 MG/2ML IJ SOLN
INTRAMUSCULAR | Status: DC | PRN
  Administered 2013-02-19: 4 mg via INTRAVENOUS

## 2013-02-19 MED ORDER — MORPHINE SULFATE 2 MG/ML IJ SOLN
0.5000 mg | INTRAMUSCULAR | Status: DC | PRN
  Administered 2013-02-19: 0.5 mg via INTRAVENOUS
  Filled 2013-02-19: qty 1

## 2013-02-19 MED ORDER — ACETAMINOPHEN 650 MG RE SUPP: 650.0000 mg | Freq: Four times a day (QID) | RECTAL | Status: DC | PRN

## 2013-02-19 MED ORDER — ZOLPIDEM TARTRATE 5 MG PO TABS: 5.0000 mg | ORAL_TABLET | Freq: Every evening | ORAL | Status: DC | PRN

## 2013-02-19 MED ORDER — CEFAZOLIN SODIUM-DEXTROSE 2-3 GM-% IV SOLR
INTRAVENOUS | Status: AC
  Filled 2013-02-19: qty 50

## 2013-02-19 MED ORDER — FENTANYL CITRATE 0.05 MG/ML IJ SOLN
INTRAMUSCULAR | Status: AC
  Filled 2013-02-19: qty 2

## 2013-02-19 MED ORDER — ONDANSETRON HCL 4 MG/2ML IJ SOLN
4.0000 mg | Freq: Four times a day (QID) | INTRAMUSCULAR | Status: DC | PRN
  Administered 2013-02-20: 4 mg via INTRAVENOUS
  Filled 2013-02-19: qty 2

## 2013-02-19 MED ORDER — BUPIVACAINE-EPINEPHRINE (PF) 0.25% -1:200000 IJ SOLN
INTRAMUSCULAR | Status: AC
  Filled 2013-02-19: qty 30

## 2013-02-19 MED ORDER — ROCURONIUM BROMIDE 100 MG/10ML IV SOLN
INTRAVENOUS | Status: DC | PRN
  Administered 2013-02-19: 40 mg via INTRAVENOUS

## 2013-02-19 MED ORDER — FERROUS SULFATE 325 (65 FE) MG PO TABS
325.0000 mg | ORAL_TABLET | Freq: Two times a day (BID) | ORAL | Status: DC
  Administered 2013-02-20 – 2013-02-21 (×3): 325 mg via ORAL
  Filled 2013-02-19 (×5): qty 1

## 2013-02-19 MED ORDER — SODIUM CHLORIDE 0.9 % IV SOLN
INTRAVENOUS | Status: AC
  Administered 2013-02-19: 17:00:00 via INTRAVENOUS

## 2013-02-19 MED ORDER — HYDROCODONE-ACETAMINOPHEN 5-325 MG PO TABS
1.0000 | ORAL_TABLET | Freq: Four times a day (QID) | ORAL | Status: DC | PRN
  Administered 2013-02-20: 1 via ORAL
  Filled 2013-02-19: qty 1

## 2013-02-19 MED ORDER — CHLORHEXIDINE GLUCONATE 4 % EX LIQD
60.0000 mL | Freq: Once | CUTANEOUS | Status: AC
  Administered 2013-02-19: 4 via TOPICAL
  Filled 2013-02-19: qty 60

## 2013-02-19 MED ORDER — LABETALOL HCL 5 MG/ML IV SOLN
INTRAVENOUS | Status: DC | PRN
  Administered 2013-02-19: 5 mg via INTRAVENOUS

## 2013-02-19 MED ORDER — LACTATED RINGERS IV SOLN
INTRAVENOUS | Status: DC | PRN
  Administered 2013-02-19 (×2): via INTRAVENOUS

## 2013-02-19 MED ORDER — BUPIVACAINE-EPINEPHRINE 0.5% -1:200000 IJ SOLN
INTRAMUSCULAR | Status: DC | PRN
  Administered 2013-02-19: 8 mL

## 2013-02-19 MED ORDER — ALUM & MAG HYDROXIDE-SIMETH 200-200-20 MG/5ML PO SUSP: 30.0000 mL | ORAL | Status: DC | PRN

## 2013-02-19 MED ORDER — MEMANTINE HCL ER 28 MG PO CP24
28.0000 mg | ORAL_CAPSULE | Freq: Every day | ORAL | Status: DC
  Administered 2013-02-20 – 2013-02-21 (×2): 28 mg via ORAL
  Filled 2013-02-19 (×3): qty 28

## 2013-02-19 MED ORDER — POLYETHYLENE GLYCOL 3350 17 G PO PACK: 17.0000 g | PACK | Freq: Every day | ORAL | Status: DC | PRN

## 2013-02-19 MED ORDER — PROPOFOL 10 MG/ML IV BOLUS
INTRAVENOUS | Status: DC | PRN
  Administered 2013-02-19: 150 mg via INTRAVENOUS

## 2013-02-19 MED ORDER — ONDANSETRON HCL 4 MG PO TABS: 4.0000 mg | ORAL_TABLET | Freq: Four times a day (QID) | ORAL | Status: DC | PRN

## 2013-02-19 MED ORDER — ACETAMINOPHEN 325 MG PO TABS
650.0000 mg | ORAL_TABLET | Freq: Four times a day (QID) | ORAL | Status: DC | PRN
  Administered 2013-02-20 (×2): 650 mg via ORAL
  Filled 2013-02-19 (×2): qty 2

## 2013-02-19 MED ORDER — METOPROLOL TARTRATE 25 MG PO TABS
25.0000 mg | ORAL_TABLET | Freq: Every day | ORAL | Status: DC
  Administered 2013-02-20 – 2013-02-21 (×2): 25 mg via ORAL
  Filled 2013-02-19 (×3): qty 1

## 2013-02-19 MED ORDER — METHOCARBAMOL 100 MG/ML IJ SOLN
500.0000 mg | Freq: Four times a day (QID) | INTRAMUSCULAR | Status: DC | PRN
  Filled 2013-02-19: qty 5

## 2013-02-19 MED ORDER — EPHEDRINE SULFATE 50 MG/ML IJ SOLN
INTRAMUSCULAR | Status: DC | PRN
  Administered 2013-02-19: 5 mg via INTRAVENOUS

## 2013-02-19 MED ORDER — NEOSTIGMINE METHYLSULFATE 1 MG/ML IJ SOLN
INTRAMUSCULAR | Status: DC | PRN
  Administered 2013-02-19: 3 mg via INTRAVENOUS

## 2013-02-19 MED ORDER — CEFAZOLIN SODIUM-DEXTROSE 2-3 GM-% IV SOLR
2.0000 g | Freq: Four times a day (QID) | INTRAVENOUS | Status: AC
  Administered 2013-02-20 (×2): 2 g via INTRAVENOUS
  Filled 2013-02-19 (×2): qty 50

## 2013-02-19 MED ORDER — ONDANSETRON HCL 4 MG/2ML IJ SOLN
4.0000 mg | Freq: Once | INTRAMUSCULAR | Status: AC | PRN
  Administered 2013-02-19: 4 mg via INTRAVENOUS

## 2013-02-19 MED ORDER — ONDANSETRON HCL 4 MG/2ML IJ SOLN
INTRAMUSCULAR | Status: AC
  Filled 2013-02-19: qty 2

## 2013-02-19 MED ORDER — FENTANYL CITRATE 0.05 MG/ML IJ SOLN
INTRAMUSCULAR | Status: DC | PRN
  Administered 2013-02-19 (×2): 50 ug via INTRAVENOUS
  Administered 2013-02-19: 100 ug via INTRAVENOUS
  Administered 2013-02-19: 50 ug via INTRAVENOUS

## 2013-02-19 SURGICAL SUPPLY — 60 items
BLADE SAW SAG 73X25 THK (BLADE) ×1
BLADE SAW SGTL 73X25 THK (BLADE) ×1 IMPLANT
BRUSH FEMORAL CANAL (MISCELLANEOUS) ×2 IMPLANT
CAPT HIP FX BIPOLAR/UNIPOLAR ×2 IMPLANT
CEMENT BONE DEPUY (Cement) ×4 IMPLANT
CEMENT RESTRICTOR DEPUY SZ 3 (Cement) ×2 IMPLANT
CLOTH BEACON ORANGE TIMEOUT ST (SAFETY) IMPLANT
COVER BACK TABLE 24X17X13 BIG (DRAPES) IMPLANT
DRAPE ORTHO SPLIT 77X108 STRL (DRAPES) ×2
DRAPE PROXIMA HALF (DRAPES) ×2 IMPLANT
DRAPE SURG ORHT 6 SPLT 77X108 (DRAPES) ×2 IMPLANT
DRAPE U-SHAPE 47X51 STRL (DRAPES) ×2 IMPLANT
DRILL BIT 7/64X5 (BIT) ×2 IMPLANT
DRSG MEPILEX BORDER 4X12 (GAUZE/BANDAGES/DRESSINGS) ×2 IMPLANT
DRSG PAD ABDOMINAL 8X10 ST (GAUZE/BANDAGES/DRESSINGS) ×2 IMPLANT
DURAPREP 26ML APPLICATOR (WOUND CARE) ×2 IMPLANT
ELECT BLADE 6.5 EXT (BLADE) ×2 IMPLANT
ELECT CAUTERY BLADE 6.4 (BLADE) ×2 IMPLANT
ELECT REM PT RETURN 9FT ADLT (ELECTROSURGICAL) ×2
ELECTRODE REM PT RTRN 9FT ADLT (ELECTROSURGICAL) ×1 IMPLANT
EVACUATOR 1/8 PVC DRAIN (DRAIN) IMPLANT
FACESHIELD LNG OPTICON STERILE (SAFETY) ×4 IMPLANT
GAUZE XEROFORM 5X9 LF (GAUZE/BANDAGES/DRESSINGS) ×2 IMPLANT
GLOVE BIOGEL PI IND STRL 8 (GLOVE) ×2 IMPLANT
GLOVE BIOGEL PI INDICATOR 8 (GLOVE) ×2
GLOVE ECLIPSE 7.5 STRL STRAW (GLOVE) ×4 IMPLANT
GOWN PREVENTION PLUS XLARGE (GOWN DISPOSABLE) ×4 IMPLANT
GOWN SRG XL XLNG 56XLVL 4 (GOWN DISPOSABLE) ×1 IMPLANT
GOWN STRL NON-REIN LRG LVL3 (GOWN DISPOSABLE) ×2 IMPLANT
GOWN STRL NON-REIN XL XLG LVL4 (GOWN DISPOSABLE) ×1
HANDPIECE INTERPULSE COAX TIP (DISPOSABLE) ×1
IMMOBILIZER KNEE 20 (SOFTGOODS) ×2
IMMOBILIZER KNEE 20 THIGH 36 (SOFTGOODS) ×1 IMPLANT
KIT BASIN OR (CUSTOM PROCEDURE TRAY) ×2 IMPLANT
KIT ROOM TURNOVER OR (KITS) ×2 IMPLANT
MANIFOLD NEPTUNE II (INSTRUMENTS) ×2 IMPLANT
NEEDLE 1/2 CIR MAYO (NEEDLE) IMPLANT
NS IRRIG 1000ML POUR BTL (IV SOLUTION) ×2 IMPLANT
PACK TOTAL JOINT (CUSTOM PROCEDURE TRAY) ×2 IMPLANT
PAD ARMBOARD 7.5X6 YLW CONV (MISCELLANEOUS) ×4 IMPLANT
PASSER SUT SWANSON 36MM LOOP (INSTRUMENTS) ×2 IMPLANT
PRESSURIZER FEMORAL UNIV (MISCELLANEOUS) ×2 IMPLANT
SET HNDPC FAN SPRY TIP SCT (DISPOSABLE) ×1 IMPLANT
SPONGE GAUZE 4X4 12PLY (GAUZE/BANDAGES/DRESSINGS) ×2 IMPLANT
SUCTION FRAZIER TIP 10 FR DISP (SUCTIONS) ×2 IMPLANT
SUT ETHIBOND 2 V 37 (SUTURE) ×4 IMPLANT
SUT PASSER 2.0 195M (MISCELLANEOUS) ×2 IMPLANT
SUT VIC AB 0 CT1 27 (SUTURE) ×1
SUT VIC AB 0 CT1 27XBRD ANBCTR (SUTURE) ×1 IMPLANT
SUT VIC AB 1 CT1 27 (SUTURE) ×1
SUT VIC AB 1 CT1 27XBRD ANBCTR (SUTURE) ×1 IMPLANT
SUT VIC AB 1 CTX 36 (SUTURE) ×1
SUT VIC AB 1 CTX36XBRD ANBCTR (SUTURE) ×1 IMPLANT
SUT VIC AB 2-0 CT1 36 (SUTURE) ×2 IMPLANT
SYR CONTROL 10ML LL (SYRINGE) IMPLANT
TOWEL OR 17X24 6PK STRL BLUE (TOWEL DISPOSABLE) ×2 IMPLANT
TOWEL OR 17X26 10 PK STRL BLUE (TOWEL DISPOSABLE) ×2 IMPLANT
TOWER CARTRIDGE SMART MIX (DISPOSABLE) ×2 IMPLANT
TRAY FOLEY CATH 16FRSI W/METER (SET/KITS/TRAYS/PACK) IMPLANT
WATER STERILE IRR 1000ML POUR (IV SOLUTION) ×8 IMPLANT

## 2013-02-19 NOTE — Preoperative (Addendum)
Beta Blockers   Reason not to administer Beta Blockers:Floor RN did not give 1500 dose of metoprolol. Plan for IV beta blocker dose intraop

## 2013-02-19 NOTE — ED Notes (Signed)
Pt. Lifted out of the car into a wheelchair.

## 2013-02-19 NOTE — H&P (Signed)
[facility] History and Physical  AVANNAH DECKER ZOX:[id number] DOB: 07-15-1933 DOA: 02/19/2013  Referring physician:  PCP: Pcp Not In System  Specialists:   Chief Complaint: Fall  HPI: Yvonne Butler is a 79 y.o. female  With a history of coronary artery disease, hyperlipidemia, diabetes or presented to the emergency room after falling. Patient is currently having right hip pain. Patient states she gone out of the car at which point she take approximately 4 steps and then fell. She stated that her leg gave way. She denies any dizziness any headache, chest pain, shortness of breath, abdominal pain. Patient states that she has not fallen before and has not had problems with ambulation.  Review of Systems:  Constitutional: Denies fever, chills, diaphoresis, appetite change and fatigue.  HEENT: Denies photophobia, eye pain, redness, hearing loss, ear pain, congestion, sore throat, rhinorrhea, sneezing, mouth sores, trouble swallowing, neck pain, neck stiffness and tinnitus.   Respiratory: Denies SOB, DOE, cough, chest tightness,  and wheezing.   Cardiovascular: Denies chest pain, palpitations and leg swelling.  Gastrointestinal: Denies nausea, vomiting, abdominal pain, diarrhea, constipation, blood in stool and abdominal distention.  Genitourinary: Denies dysuria, urgency, frequency, hematuria, flank pain and difficulty urinating.  Musculoskeletal: Denies myalgias, back pain, joint swelling, arthralgias and gait problem. Positive for right hip pain and fall. Skin: Denies pallor, rash and wound.  Neurological: Denies dizziness, seizures, syncope, weakness, light-headedness, numbness and headaches.  Hematological: Denies adenopathy. Easy bruising, personal or family bleeding history  Psychiatric/Behavioral: Denies suicidal ideation, mood changes, confusion, nervousness, sleep disturbance and agitation  Past Medical History  Diagnosis Date  . CAD (coronary artery disease)     with CABG-  Dr. Katrinka Blazing  . Hypercholesteremia   . Coronary atherosclerosis of native coronary artery   . Vitamin D deficiency   . Hyperlipidemia   . Osteopenia   . Diverticulosis   . Diabetes    Past Surgical History  Procedure Laterality Date  . Coronary artery bypass graft       in with LIMA to LAD, SVG to the circumflex, SVG to the diagonal, and SVG to the PDA.  [name] Appendectomy    . Ovarian cyst & fallopian tubes     Social History:  reports that she has quit smoking. She does not have any smokeless tobacco history on file. She reports that she does not drink alcohol or use illicit drugs. Lives at home with her husband.  No Known Allergies  No family history on file.  Prior to Admission medications   Medication Sig Start Date End Date Taking? Authorizing Provider  Choline Fenofibrate (FENOFIBRIC ACID) 45 MG CPDR Take 1 tablet by mouth daily. 11/25/12   Historical Provider, MD  metoprolol tartrate (LOPRESSOR) 25 MG tablet Take 1 tablet (25 mg total) by mouth daily. 02/10/13   Lyn Records III, MD  NAMENDA XR 28 MG CP24 Take 1 tablet by mouth daily. 09/28/12   Historical Provider, MD  simvastatin (ZOCOR) 20 MG tablet Take 1 tablet by mouth daily. 11/14/12   Historical Provider, MD   Physical Exam: Filed Vitals:   02/19/13 1233  BP: 186/70  Pulse: 71  Temp:   Resp: 16     General: Well developed, well nourished, NAD, appears stated age  HEENT: NCAT, PERRLA, EOMI, Anicteic Sclera, mucous membranes moist. No pharyngeal erythema or exudates  Neck: Supple, no JVD, no masses,  Cardiovascular: S1 S2 auscultated, no rubs, murmurs or gallops. Regular rate and rhythm.  Respiratory: Clear to auscultation bilaterally  with equal chest rise  Abdomen: Soft, nontender, nondistended, + bowel sounds  Extremities: warm dry without cyanosis clubbing or edema.  Pain with palpation of the right hip.  Neuro: AAOx3, cranial nerves grossly intact. Strength 5/5 in patient's upper and lower extremities  bilaterally  Skin: Without rashes exudates or nodules  Psych: Normal affect and demeanor with intact judgement and insight  Labs on Admission:  Basic Metabolic Panel:  Recent Labs Lab 02/19/13 1139  NA 139  K 3.8  CL 101  CO2 27  GLUCOSE 131*  BUN 18  CREATININE 0.85  CALCIUM 9.5   Liver Function Tests: No results found for this basename: AST, ALT, ALKPHOS, BILITOT, PROT, ALBUMIN,  in the last 168 hours No results found for this basename: LIPASE, AMYLASE,  in the last 168 hours No results found for this basename: AMMONIA,  in the last 168 hours CBC:  Recent Labs Lab 02/19/13 1139  WBC 12.8*  NEUTROABS 10.1*  HGB 13.0  HCT 38.8  MCV 95.6  PLT 333   Cardiac Enzymes: No results found for this basename: CKTOTAL, CKMB, CKMBINDEX, TROPONINI,  in the last 168 hours  BNP (last 3 results) No results found for this basename: PROBNP,  in the last 8760 hours CBG: No results found for this basename: GLUCAP,  in the last 168 hours  Radiological Exams on Admission: Dg Chest 1 View  02/19/2013   CLINICAL DATA:  Right hip pain after fall.  EXAM: CHEST - 1 VIEW  COMPARISON:  January 06, 2008.  FINDINGS: Status post coronary artery bypass graft. Stable cardiomediastinal silhouette. No pneumothorax or pleural effusion is noted. No acute pulmonary disease is noted. Bony thorax is intact.  IMPRESSION: No acute cardiopulmonary abnormality seen.   Electronically Signed   By: Roque Lias [name]   On: 02/19/2013 12:26   Dg Hip Complete Right  02/19/2013   CLINICAL DATA:  Fall  EXAM: RIGHT HIP - COMPLETE 2+ VIEW  COMPARISON:  None.  FINDINGS: Three views of the right hip submitted. There is displaced fracture of the right femoral neck.  IMPRESSION: Mild displaced fracture of the right femoral neck.   Electronically Signed   By: Natasha Mead [name]   On: 02/19/2013 12:27    EKG: Independently reviewed. Sinus rhythm, rate 68, normal axis.  Assessment/Plan Active Problems:   Coronary  atherosclerosis of autologous vein bypass graft   Essential hypertension, benign   Other and unspecified hyperlipidemia   Type II or unspecified type diabetes mellitus without mention of complication, uncontrolled   Femoral neck fracture   Right femoral fracture   Right femoral neck fracture secondary to mechanical fall Patient will be admitted to the medical surgical floor.  Will also consult orthopedics for further intervention and management. Place patient on foot pumps at this time. Spoke with Dr. Luiz Blare, the plan is to take the patient to a the operating room at approximately 5:30 - 6:00 this evening. Will keep patient n.p.o. And provide pain control. Patient was recently seen by her cardiologist on 01/03/2013 the office. Followup is planned for one year with no change in therapy.  Hypertension Likely uncontrolled at this time due to pain. Will place patient on hydralazine 10 mg when necessary for systolic blood pressures over 161 or diastolic blood pressures over 096. Will resume her home medications after surgery.  Diabetes mellitus type 2 Patient is on any medications at home for diabetes. Likely diet-controlled. Will obtain hemoglobin A1c and place her on insulin sliding scale with  CBG monitoring.  Hyperlipidemia Will restart patient on simvastatin and fenofibrate postop.  History of coronary disease status post CABG Patient is currently chest pain-free. We'll continue to monitor.  Memory loss Will continue Namenda postop.  Leukocytosis Patient currently afebrile no source of infection. Likely reactive.  Will continue to monitor CBC. Chest x-ray was negative for any type of infection or infiltrate, urinalysis was also negative.  DVT prophylaxis: Foot pumps  Code Status: Full  Condition: Guarded  Family Communication: Husband and daughter at bedside. Admission, patients condition and plan of care including tests being ordered have been discussed with the patient and family  who indicate understanding and agree with the plan and Code Status.  Disposition Plan: Admitted, approximate length of stay 2-3 midnights  Time spent: 45 minutes  Britni Driscoll D.O. [facility] Pager [phone]  If 7PM-7AM, please contact night-coverage [email or web address] Password [email or web address] 02/19/2013, 1:49 PM

## 2013-02-19 NOTE — Progress Notes (Signed)
Orthopedic Tech Progress Note Patient Details:  Yvonne Butler 01-15-1934 [id number]  Patient ID: [name], female   DOB: 05-29-1933, 79 y.o.   MRN: [id number] Trapeze bar patient helper  Nikki Dom 02/19/2013, 8:12 PM

## 2013-02-19 NOTE — Transfer of Care (Signed)
Immediate Anesthesia Transfer of Care Note  Patient: Yvonne Butler  Procedure(s) Performed: Procedure(s): HEMI ARTHROPLASTY BIPOLAR HIP CEMENTED VS UNCEMENTED (Right)  Patient Location: PACU  Anesthesia Type:General  Level of Consciousness: awake, alert , oriented and patient cooperative  Airway & Oxygen Therapy: Patient Spontanous Breathing and Patient connected to nasal cannula oxygen  Post-op Assessment: Report given to PACU RN and Post -op Vital signs reviewed and stable  Post vital signs: Reviewed  Complications: No apparent anesthesia complications

## 2013-02-19 NOTE — Consult Note (Signed)
Reason for Consult:  R fem neck fx Referring Physician: hospitalists  Yvonne Butler is an 79 y.o. female.  HPI: 79yo female who fell today and c/o r hip pain.  Pt unable to stand or walk.  X-ray shows fem neck fx and we are consulted for treatment.  Past Medical History  Diagnosis Date  . CAD (coronary artery disease)     with CABG- Dr. Katrinka Blazing  . Hypercholesteremia   . Coronary atherosclerosis of native coronary artery   . Vitamin D deficiency   . Hyperlipidemia   . Osteopenia   . Diverticulosis   . Diabetes     Past Surgical History  Procedure Laterality Date  . Coronary artery bypass graft       in with LIMA to LAD, SVG to the circumflex, SVG to the diagonal, and SVG to the PDA.  [name] Appendectomy    . Ovarian cyst & fallopian tubes      No family history on file.  Social History:  reports that she has quit smoking. She does not have any smokeless tobacco history on file. She reports that she does not drink alcohol or use illicit drugs.  Allergies: No Known Allergies  Medications: I have reviewed the patient's current medications.  Results for orders placed during the hospital encounter of 02/19/13 (from the past 48 hour(s))  BASIC METABOLIC PANEL     Status: Abnormal   Collection Time    02/19/13 11:39 AM      Result Value Range   Sodium 139  135 - 145 mEq/L   Potassium 3.8  3.5 - 5.1 mEq/L   Chloride 101  96 - 112 mEq/L   CO2 27  19 - 32 mEq/L   Glucose, Bld 131 (*) 70 - 99 mg/dL   BUN 18  6 - 23 mg/dL   Creatinine, Ser 4.54  0.50 - 1.10 mg/dL   Calcium 9.5  8.4 - 09.8 mg/dL   GFR calc non Af Amer 64 (*) >90 mL/min   GFR calc Af Amer 75 (*) >90 mL/min   Comment: (NOTE)     The eGFR has been calculated using the CKD EPI equation.     This calculation has not been validated in all clinical situations.     eGFR's persistently <90 mL/min signify possible Chronic Kidney     Disease.  CBC WITH DIFFERENTIAL     Status: Abnormal   Collection Time    02/19/13 11:39  AM      Result Value Range   WBC 12.8 (*) 4.0 - 10.5 K/uL   RBC 4.06  3.87 - 5.11 MIL/uL   Hemoglobin 13.0  12.0 - 15.0 g/dL   HCT 11.9  14.7 - 82.9 %   MCV 95.6  78.0 - 100.0 fL   MCH 32.0  26.0 - 34.0 pg   MCHC 33.5  30.0 - 36.0 g/dL   RDW 56.2  13.0 - 86.5 %   Platelets 333  150 - 400 K/uL   Neutrophils Relative % 80 (*) 43 - 77 %   Neutro Abs 10.1 (*) 1.7 - 7.7 K/uL   Lymphocytes Relative 12  12 - 46 %   Lymphs Abs 1.5  0.7 - 4.0 K/uL   Monocytes Relative 7  3 - 12 %   Monocytes Absolute 0.8  0.1 - 1.0 K/uL   Eosinophils Relative 2  0 - 5 %   Eosinophils Absolute 0.2  0.0 - 0.7 K/uL   Basophils Relative 0  0 - 1 %   Basophils Absolute 0.0  0.0 - 0.1 K/uL  PROTIME-INR     Status: None   Collection Time    02/19/13 11:39 AM      Result Value Range   Prothrombin Time 11.8  11.6 - 15.2 seconds   INR 0.88  0.00 - 1.49  TYPE AND SCREEN     Status: None   Collection Time    02/19/13 11:50 AM      Result Value Range   ABO/RH(D) A POS     Antibody Screen NEG     Sample Expiration 02/22/2013    URINALYSIS, ROUTINE W REFLEX MICROSCOPIC     Status: None   Collection Time    02/19/13 12:52 PM      Result Value Range   Color, Urine YELLOW  YELLOW   APPearance CLEAR  CLEAR   Specific Gravity, Urine 1.012  1.005 - 1.030   pH 7.0  5.0 - 8.0   Glucose, UA NEGATIVE  NEGATIVE mg/dL   Hgb urine dipstick NEGATIVE  NEGATIVE   Bilirubin Urine NEGATIVE  NEGATIVE   Ketones, ur NEGATIVE  NEGATIVE mg/dL   Protein, ur NEGATIVE  NEGATIVE mg/dL   Urobilinogen, UA 0.2  0.0 - 1.0 mg/dL   Nitrite NEGATIVE  NEGATIVE   Leukocytes, UA NEGATIVE  NEGATIVE   Comment: MICROSCOPIC NOT DONE ON URINES WITH NEGATIVE PROTEIN, BLOOD, LEUKOCYTES, NITRITE, OR GLUCOSE <1000 mg/dL.    Dg Chest 1 View  02/19/2013   CLINICAL DATA:  Right hip pain after fall.  EXAM: CHEST - 1 VIEW  COMPARISON:  January 06, 2008.  FINDINGS: Status post coronary artery bypass graft. Stable cardiomediastinal silhouette. No  pneumothorax or pleural effusion is noted. No acute pulmonary disease is noted. Bony thorax is intact.  IMPRESSION: No acute cardiopulmonary abnormality seen.   Electronically Signed   By: Roque Lias [name]   On: 02/19/2013 12:26   Dg Hip Complete Right  02/19/2013   CLINICAL DATA:  Fall  EXAM: RIGHT HIP - COMPLETE 2+ VIEW  COMPARISON:  None.  FINDINGS: Three views of the right hip submitted. There is displaced fracture of the right femoral neck.  IMPRESSION: Mild displaced fracture of the right femoral neck.   Electronically Signed   By: Natasha Mead [name]   On: 02/19/2013 12:27  markedly displaced fem neck fx and no evidence of arthritis  ROS ROS: I have reviewed the patient's review of systems thoroughly and there are no positive responses as relates to the HPI. Exam: Blood pressure 193/64, pulse 77, temperature 97.9 F (36.6 C), temperature source Oral, resp. rate 16, SpO2 98.00%. Physical Exam Well-developed well-nourished patient in no acute distress. Alert and oriented x3 HEENT:within normal limits Cardiac: Regular rate and rhythm Pulmonary: Lungs clear to auscultation Abdomen: Soft and nontender.  Normal active bowel sounds  Musculoskeletal: r hip ext rotated and shortened 2+dista; pulse Assessment/Plan: 79 yo female with displaced r fem neck fx.// Pt will be admitted and cleared via medicine and will be taken to OR when available for r hemi-arthroplastys  Adalbert Alberto L 02/19/2013, 5:26 PM

## 2013-02-19 NOTE — Brief Op Note (Signed)
02/19/2013  7:12 PM  PATIENT:  ALLYCIA PITZ  79 y.o. female  PRE-OPERATIVE DIAGNOSIS:  fracture right hip  POST-OPERATIVE DIAGNOSIS:  fracture right hip  PROCEDURE:  Procedure(s): HEMI ARTHROPLASTY BIPOLAR HIP CEMENTED VS UNCEMENTED (Right)  SURGEON:  Surgeon(s) and Role:    * Harvie Junior, MD - Primary  PHYSICIAN ASSISTANT:   ASSISTANTS: bethune   ANESTHESIA:   general  EBL:     BLOOD ADMINISTERED:none  DRAINS: none   LOCAL MEDICATIONS USED:  MARCAINE     SPECIMEN:  No Specimen  DISPOSITION OF SPECIMEN:  N/A  COUNTS:  YES  TOURNIQUET:  * No tourniquets in log *  DICTATION: .Other Dictation: Dictation Number [id number]  PLAN OF CARE: Admit to inpatient   PATIENT DISPOSITION:  PACU - hemodynamically stable.   Delay start of Pharmacological VTE agent (>24hrs) due to surgical blood loss or risk of bleeding: no

## 2013-02-19 NOTE — ED Provider Notes (Signed)
CSN: [id number]     Arrival date & time 02/19/13  1035 History   First MD Initiated Contact with Patient 02/19/13 1111     Chief Complaint  Patient presents with  . Hip Pain  . Fall   (Consider location/radiation/quality/duration/timing/severity/associated sxs/prior Treatment) Patient is a 79 y.o. female presenting with hip pain and fall. The history is provided by the patient.  Hip Pain This is a new problem. Pertinent negatives include no chest pain, no abdominal pain, no headaches and no shortness of breath.  Fall Pertinent negatives include no chest pain, no abdominal pain, no headaches and no shortness of breath.   patient states that she was getting out of church when she fell onto her right hip. She's been unable to ambulate since. She states she has some chronic problems with her legs. No previous surgery. She denies syncope. No lightheadedness or dizziness. No numbness or weakness.  Past Medical History  Diagnosis Date  . CAD (coronary artery disease)     with CABG- Dr. Katrinka Blazing  . Hypercholesteremia   . Coronary atherosclerosis of native coronary artery   . Vitamin D deficiency   . Hyperlipidemia   . Osteopenia   . Diverticulosis   . Diabetes    Past Surgical History  Procedure Laterality Date  . Coronary artery bypass graft       in with LIMA to LAD, SVG to the circumflex, SVG to the diagonal, and SVG to the PDA.  [name] Appendectomy    . Ovarian cyst & fallopian tubes     No family history on file. History  Substance Use Topics  . Smoking status: Former Smoker    Types: Cigarettes  . Smokeless tobacco: Not on file  . Alcohol Use: No   OB History   Grav Para Term Preterm Abortions TAB SAB Ect Mult Living                 Review of Systems  Constitutional: Negative for activity change and appetite change.  Eyes: Negative for pain.  Respiratory: Negative for chest tightness and shortness of breath.   Cardiovascular: Negative for chest pain and leg swelling.   Gastrointestinal: Negative for nausea, vomiting, abdominal pain and diarrhea.  Genitourinary: Negative for flank pain.  Musculoskeletal: Positive for gait problem. Negative for back pain and neck stiffness.  Skin: Negative for rash.  Neurological: Negative for weakness, numbness and headaches.  Psychiatric/Behavioral: Negative for behavioral problems.    Allergies  Review of patient's allergies indicates no known allergies.  Home Medications   Current Outpatient Rx  Name  Route  Sig  Dispense  Refill  . aspirin EC 325 MG tablet   Oral   Take 1 tablet (325 mg total) by mouth 2 (two) times daily after a meal.   60 tablet   0   . HYDROcodone-acetaminophen (NORCO) 5-325 MG per tablet   Oral   Take 1 tablet by mouth every 6 (six) hours as needed for moderate pain.   40 tablet   0    BP 183/63  Pulse 106  Temp(Src) 99 F (37.2 C) (Oral)  Resp 16  Ht 5\' 4"  (1.626 m)  Wt 159 lb (72.122 kg)  BMI 27.28 kg/m2  SpO2 95% Physical Exam  Nursing note and vitals reviewed. Constitutional: She is oriented to person, place, and time. She appears well-developed and well-nourished.  HENT:  Head: Normocephalic and atraumatic.  Eyes: EOM are normal. Pupils are equal, round, and reactive to light.  Neck: Normal  range of motion. Neck supple.  Cardiovascular: Normal rate, regular rhythm and normal heart sounds.   No murmur heard. Pulmonary/Chest: Effort normal and breath sounds normal. No respiratory distress. She has no wheezes. She has no rales.  Abdominal: Soft. Bowel sounds are normal. She exhibits no distension. There is no tenderness. There is no rebound and no guarding.  Musculoskeletal: She exhibits tenderness.  Tenderness to right hip. Decreased range of motion in right hip. Neurovascular intact over right foot. Pelvis is stable. No ecchymosis over hip.  Neurological: She is alert and oriented to person, place, and time. No cranial nerve deficit.  Skin: Skin is warm and dry.   Psychiatric: She has a normal mood and affect. Her speech is normal.    ED Course  Procedures (including critical care time) Labs Review Labs Reviewed  BASIC METABOLIC PANEL - Abnormal; Notable for the following:    Glucose, Bld 131 (*)    GFR calc non Af Amer 64 (*)    GFR calc Af Amer 75 (*)    All other components within normal limits  CBC WITH DIFFERENTIAL - Abnormal; Notable for the following:    WBC 12.8 (*)    Neutrophils Relative % 80 (*)    Neutro Abs 10.1 (*)    All other components within normal limits  HEMOGLOBIN A1C - Abnormal; Notable for the following:    Hemoglobin A1C 7.0 (*)    Mean Plasma Glucose 154 (*)    All other components within normal limits  CBC - Abnormal; Notable for the following:    RBC 3.15 (*)    Hemoglobin 10.0 (*)    HCT 30.3 (*)    All other components within normal limits  BASIC METABOLIC PANEL - Abnormal; Notable for the following:    Glucose, Bld 166 (*)    Calcium 8.2 (*)    GFR calc non Af Amer 80 (*)    All other components within normal limits  GLUCOSE, CAPILLARY - Abnormal; Notable for the following:    Glucose-Capillary 118 (*)    All other components within normal limits  GLUCOSE, CAPILLARY - Abnormal; Notable for the following:    Glucose-Capillary 145 (*)    All other components within normal limits  GLUCOSE, CAPILLARY - Abnormal; Notable for the following:    Glucose-Capillary 166 (*)    All other components within normal limits  GLUCOSE, CAPILLARY - Abnormal; Notable for the following:    Glucose-Capillary 143 (*)    All other components within normal limits  GLUCOSE, CAPILLARY - Abnormal; Notable for the following:    Glucose-Capillary 207 (*)    All other components within normal limits  GLUCOSE, CAPILLARY - Abnormal; Notable for the following:    Glucose-Capillary 155 (*)    All other components within normal limits  GLUCOSE, CAPILLARY - Abnormal; Notable for the following:    Glucose-Capillary 281 (*)    All  other components within normal limits  GLUCOSE, CAPILLARY - Abnormal; Notable for the following:    Glucose-Capillary 104 (*)    All other components within normal limits  SURGICAL PCR SCREEN  PROTIME-INR  URINALYSIS, ROUTINE W REFLEX MICROSCOPIC  TYPE AND SCREEN  ABO/RH   Imaging Review Dg Chest 1 View  02/19/2013   CLINICAL DATA:  Right hip pain after fall.  EXAM: CHEST - 1 VIEW  COMPARISON:  January 06, 2008.  FINDINGS: Status post coronary artery bypass graft. Stable cardiomediastinal silhouette. No pneumothorax or pleural effusion is noted. No acute pulmonary  disease is noted. Bony thorax is intact.  IMPRESSION: No acute cardiopulmonary abnormality seen.   Electronically Signed   By: Roque Lias [name]   On: 02/19/2013 12:26   Dg Hip Complete Right  02/19/2013   CLINICAL DATA:  Fall  EXAM: RIGHT HIP - COMPLETE 2+ VIEW  COMPARISON:  None.  FINDINGS: Three views of the right hip submitted. There is displaced fracture of the right femoral neck.  IMPRESSION: Mild displaced fracture of the right femoral neck.   Electronically Signed   By: Natasha Mead [name]   On: 02/19/2013 12:27   Dg Pelvis Portable  02/20/2013   CLINICAL DATA:  Postoperative radiographs; status post right hip hemiarthroplasty.  EXAM: PORTABLE PELVIS 1-2 VIEWS  COMPARISON:  None.  FINDINGS: There is no evidence of fracture or dislocation. The patient is status post right hip hemiarthroplasty; the prosthesis is unremarkable in appearance, without evidence of fracture or loosening. No significant degenerative change is appreciated. The sacroiliac joints are unremarkable in appearance.  The visualized bowel gas pattern is grossly unremarkable in appearance. Scattered postoperative soft tissue air is noted on the right side, with overlying skin staples.  IMPRESSION: Status post right hip hemiarthroplasty; the prosthesis is unremarkable in appearance, without evidence of fracture or loosening.   Electronically Signed   By: Roanna Raider  [name]   On: 02/20/2013 00:22   Dg Hip Portable 1 View Right  02/20/2013   CLINICAL DATA:  Postoperative radiographs, status post right hip hemiarthroplasty.  EXAM: PORTABLE RIGHT HIP - 1 VIEW  COMPARISON:  None.  FINDINGS: A single cross-table lateral view demonstrates the patient's right hip hemiarthroplasty, without evidence of fracture or loosening. Overlying soft tissue air is seen.  IMPRESSION: Right hip hemiarthroplasty noted, without evidence of fracture or loosening.   Electronically Signed   By: Roanna Raider [name]   On: 02/20/2013 00:23    EKG Interpretation    Date/Time:  Sunday February 19 2013 12:55:11 EST Ventricular Rate:  68 PR Interval:  206 QRS Duration: 82 QT Interval:  416 QTC Calculation: 442 R Axis:   77 Text Interpretation:  Sinus rhythm Nonspecific T abnormalities, diffuse leads Confirmed by Aspin Palomarez  MD, Evony Rezek ([id number]) on 02/20/2013 11:35:10 PM            MDM   1. Fracture of femoral neck, right, closed, initial encounter   2. Coronary atherosclerosis of autologous vein bypass graft   3. Essential hypertension, benign   4. Other and unspecified hyperlipidemia   5. Type II or unspecified type diabetes mellitus without mention of complication, uncontrolled   6. Femoral neck fracture, right, closed, initial encounter   7. Leukocytosis   8. Memory loss    Patient with mechanical fall. Hip fracture. Admitted to internal medicine    Juliet Rude. Rubin Payor, MD 02/20/13 [phone]

## 2013-02-19 NOTE — Anesthesia Procedure Notes (Signed)
Procedure Name: Intubation Date/Time: 02/19/2013 5:38 PM Performed by: Lovie Chol Pre-anesthesia Checklist: Patient identified, Emergency Drugs available, Suction available, Patient being monitored and Timeout performed Patient Re-evaluated:Patient Re-evaluated prior to inductionOxygen Delivery Method: Circle system utilized Preoxygenation: Pre-oxygenation with 100% oxygen Intubation Type: IV induction Ventilation: Mask ventilation without difficulty Laryngoscope Size: Miller and 2 Grade View: Grade I Tube type: Oral Tube size: 7.0 mm Number of attempts: 1 Airway Equipment and Method: Stylet Placement Confirmation: ETT inserted through vocal cords under direct vision,  positive ETCO2,  CO2 detector and breath sounds checked- equal and bilateral Secured at: 21 cm Tube secured with: Tape Dental Injury: Teeth and Oropharynx as per pre-operative assessment

## 2013-02-19 NOTE — ED Notes (Signed)
Pt./husband stated, we were going to Sunday School and she fell on the asphalt, unable to move her rt. Leg.  Helped pt. Out of car, pt. Unable to help herself into wheelchair

## 2013-02-19 NOTE — Anesthesia Preprocedure Evaluation (Addendum)
Anesthesia Evaluation  Patient identified by MRN, date of birth, ID band Patient awake    Reviewed: Allergy & Precautions, H&P , NPO status , Patient's Chart, lab work & pertinent test results, reviewed documented beta blocker date and time   History of Anesthesia Complications Negative for: history of anesthetic complications  Airway Mallampati: II TM Distance: >3 FB Neck ROM: full    Dental  (+) Edentulous Upper, Edentulous Lower and Dental Advisory Given   Pulmonary former smoker,          Cardiovascular hypertension, Pt. on medications and Pt. on home beta blockers + CAD and + CABG Rhythm:Regular Rate:Normal     Neuro/Psych negative neurological ROS  negative psych ROS   GI/Hepatic negative GI ROS, Neg liver ROS,   Endo/Other  diabetes, Type 2, Oral Hypoglycemic Agents  Renal/GU negative Renal ROS     Musculoskeletal negative musculoskeletal ROS (+)   Abdominal   Peds  Hematology   Anesthesia Other Findings   Reproductive/Obstetrics                          Anesthesia Physical Anesthesia Plan  ASA: III  Anesthesia Plan: General   Post-op Pain Management:    Induction: Intravenous  Airway Management Planned: Oral ETT  Additional Equipment:   Intra-op Plan:   Post-operative Plan: Extubation in OR  Informed Consent: I have reviewed the patients History and Physical, chart, labs and discussed the procedure including the risks, benefits and alternatives for the proposed anesthesia with the patient or authorized representative who has indicated his/her understanding and acceptance.     Plan Discussed with: CRNA, Anesthesiologist and Surgeon  Anesthesia Plan Comments:         Anesthesia Quick Evaluation

## 2013-02-19 NOTE — Anesthesia Postprocedure Evaluation (Signed)
Anesthesia Post Note  Patient: Yvonne Butler  Procedure(s) Performed: Procedure(s) (LRB): HEMI ARTHROPLASTY BIPOLAR HIP CEMENTED VS UNCEMENTED (Right)  Anesthesia type: General  Patient location: PACU  Post pain: Pain level controlled and Adequate analgesia  Post assessment: Post-op Vital signs reviewed, Patient's Cardiovascular Status Stable, Respiratory Function Stable, Patent Airway and Pain level controlled  Last Vitals:  Filed Vitals:   02/19/13 2015  BP: 132/53  Pulse:   Temp:   Resp:     Post vital signs: Reviewed and stable  Level of consciousness: awake, alert  and oriented  Complications: No apparent anesthesia complications

## 2013-02-19 NOTE — Progress Notes (Signed)
OR transporter [facility] and [facility] informed of high blood pressure.   Patient requested that Dentures go with her. Therefore, Dentures sent down with [facility] and [address]

## 2013-02-20 ENCOUNTER — Encounter (HOSPITAL_COMMUNITY): Payer: Self-pay | Admitting: *Deleted

## 2013-02-20 ENCOUNTER — Other Ambulatory Visit: Payer: Self-pay

## 2013-02-20 ENCOUNTER — Encounter (INDEPENDENT_AMBULATORY_CARE_PROVIDER_SITE_OTHER): Payer: Medicare Other | Admitting: Ophthalmology

## 2013-02-20 DIAGNOSIS — R413 Other amnesia: Secondary | ICD-10-CM

## 2013-02-20 DIAGNOSIS — D72829 Elevated white blood cell count, unspecified: Secondary | ICD-10-CM

## 2013-02-20 HISTORY — PX: HIP SURGERY: SHX245

## 2013-02-20 LAB — CBC
HCT: 30.3 % — ABNORMAL LOW (ref 36.0–46.0)
Hemoglobin: 10 g/dL — ABNORMAL LOW (ref 12.0–15.0)
MCV: 96.2 fL (ref 78.0–100.0)
RBC: 3.15 MIL/uL — ABNORMAL LOW (ref 3.87–5.11)
RDW: 14.6 % (ref 11.5–15.5)
WBC: 10.3 10*3/uL (ref 4.0–10.5)

## 2013-02-20 LAB — HEMOGLOBIN A1C: Hgb A1c MFr Bld: 7 % — ABNORMAL HIGH (ref <5.7)

## 2013-02-20 LAB — BASIC METABOLIC PANEL
BUN: 12 mg/dL (ref 6–23)
CO2: 25 mEq/L (ref 19–32)
Chloride: 103 mEq/L (ref 96–112)
Creatinine, Ser: 0.73 mg/dL (ref 0.50–1.10)
GFR calc Af Amer: >90 mL/min (ref >90)
Potassium: 4.1 mEq/L (ref 3.5–5.1)

## 2013-02-20 LAB — GLUCOSE, CAPILLARY
Glucose-Capillary: 143 mg/dL — ABNORMAL HIGH (ref 70–99)
Glucose-Capillary: 281 mg/dL — ABNORMAL HIGH (ref 70–99)

## 2013-02-20 MED ORDER — METOPROLOL TARTRATE 25 MG PO TABS: 25.0000 mg | ORAL_TABLET | Freq: Every day | ORAL | Status: DC

## 2013-02-20 MED ORDER — METOCLOPRAMIDE HCL 10 MG PO TABS
5.0000 mg | ORAL_TABLET | Freq: Four times a day (QID) | ORAL | Status: DC | PRN
  Administered 2013-02-20: 5 mg via ORAL
  Filled 2013-02-20: qty 1

## 2013-02-20 NOTE — Evaluation (Signed)
Occupational Therapy Evaluation Patient Details Name: Yvonne Butler MRN: [id number] DOB: [birth year] Today's Date: 02/20/2013 Time: 4098-1191 OT Time Calculation (min): 34 min  OT Assessment / Plan / Recommendation History of present illness Pt admitted s/p fall and Right hip hemiarthroplasty on 02/19/13. RLE WBAT   Clinical Impression   Pt presents s/p R hip hemiarthroplasty now impacting her ability to perform LB ADL's and functional transfers. She will benefit from acute OT to assist in maximizing independence a/ ADL's prior to d/c to SNF Rehab.    OT Assessment  Patient needs continued OT Services    Follow Up Recommendations  SNF    Barriers to Discharge      Equipment Recommendations  Other (comment) (Cont to assess, defer to SNF Rehab)    Recommendations for Other Services    Frequency  Min 2X/week    Precautions / Restrictions Precautions Precautions: Fall;Posterior Hip Required Braces or Orthoses: Knee Immobilizer - Right Restrictions Weight Bearing Restrictions: Yes RLE Weight Bearing: Weight bearing as tolerated   Pertinent Vitals/Pain Pain R hip and nausea, not rated.    ADL  Eating/Feeding: Performed;Modified independent Where Assessed - Eating/Feeding: Chair;Edge of bed Grooming: Performed;Wash/dry face;Wash/dry hands;Set up Where Assessed - Grooming: Unsupported sitting;Supported standing Upper Body Bathing: Simulated;Set up Where Assessed - Upper Body Bathing: Supported sitting Lower Body Bathing: Simulated;Maximal assistance Where Assessed - Lower Body Bathing: Supported sit to stand Upper Body Dressing: Performed;Set up Where Assessed - Upper Body Dressing: Unsupported sitting Lower Body Dressing: Performed;Maximal assistance Where Assessed - Lower Body Dressing: Supported sit to stand Toilet Transfer: Simulated;Minimal assistance Toilet Transfer Method: Sit to Barista: Raised toilet seat with arms (or 3-in-1 over  toilet) Toileting - Clothing Manipulation and Hygiene: Simulated;Minimal assistance Where Assessed - Engineer, mining and Hygiene: Sit to stand from 3-in-1 or toilet;Standing Tub/Shower Transfer Method: Not assessed Equipment Used: Gait belt;Knee Immobilizer;Rolling walker Transfers/Ambulation Related to ADLs: Pt overall Min assist for functional mobility given increased time for all tasks ADL Comments: Pt/family were educated in Role of OT and discussed recommendations for SNF Rehab prior to returning home. Pt states that her husband is 84y/o and pt has STM deficits. Daughter lives nearby and able to offer intermittent assistance. Pt noted to vomit on way to bathroom today and c/o nausea. She will benefit from acute OT to address LB deficits and THP prior to returning home w/ family PRN assist.    OT Diagnosis: Generalized weakness;Acute pain  OT Problem List: Decreased activity tolerance;Decreased knowledge of precautions;Decreased knowledge of use of DME or AE;Decreased cognition;Pain OT Treatment Interventions: Self-care/ADL training;DME and/or AE instruction;Patient/family education;Therapeutic activities;Therapeutic exercise   OT Goals(Current goals can be found in the care plan section) Acute Rehab OT Goals Patient Stated Goal: Get better, heal this hip Time For Goal Achievement: 03/06/13 Potential to Achieve Goals: Good  Visit Information  Last OT Received On: 02/20/13 PT/OT/SLP Co-Evaluation/Treatment: Yes OT goals addressed during session: ADL's and self-care;Proper use of Adaptive equipment and DME History of Present Illness: Pt admitted s/p fall and Right hip hemiarthroplasty on 02/19/13. RLE WBAT       Prior Functioning     Home Living Family/patient expects to be discharged to:: Private residence Living Arrangements: Spouse/significant other Available Help at Discharge: Available 24 hours/day;Family Type of Home: House Home Access: Stairs to  enter [employer] of Steps: 2 STE  Entrance Stairs-Rails: Left Home Layout: One level Home Equipment: Cane - single point Prior Function Level of Independence: Independent Communication Communication:  No difficulties Dominant Hand: Right    Vision/Perception Vision - History Baseline Vision: Wears glasses only for reading Visual History: Cataracts (Left eye) Patient Visual Report: No change from baseline   Cognition  Cognition Arousal/Alertness: Awake/alert Behavior During Therapy: WFL for tasks assessed/performed Overall Cognitive Status: History of cognitive impairments - at baseline Memory: Decreased short-term memory    Extremity/Trunk Assessment Upper Extremity Assessment Upper Extremity Assessment: Overall WFL for tasks assessed Lower Extremity Assessment Lower Extremity Assessment: Defer to PT evaluation    Mobility Bed Mobility Bed Mobility: Supine to Sit;Sitting - Scoot to Edge of Bed Supine to Sit: 3: Mod assist;HOB flat Sitting - Scoot to Edge of Bed: 4: Min assist;3: Mod assist;With rail Details for Bed Mobility Assistance: VC's for hand placement & safety Transfers Transfers: Sit to Stand;Stand to Sit Sit to Stand: 3: Mod assist;From bed;With upper extremity assist Stand to Sit: 4: Min assist;To chair/3-in-1;With upper extremity assist Details for Transfer Assistance: Pt requires consistent vc's and tc's for safety, sequencing w/ RW & hand placement. She reports difficulty w/ STM.             End of Session OT - End of Session Equipment Utilized During Treatment: Gait belt;Rolling walker;Right knee immobilizer Activity Tolerance: Other (comment) (Pt limited by nausea/vommiting) Patient left: in chair;with call bell/phone within reach;with family/visitor present Nurse Communication: Mobility status;Other (comment) (Nausea/vomitted)  GO     Alm Bustard 02/20/2013, 11:14 AM

## 2013-02-20 NOTE — Evaluation (Signed)
Physical Therapy Evaluation Patient Details Name: Yvonne Butler MRN: [id number] DOB: January 03, 1934 Today's Date: 02/20/2013 Time: 1002-1036 PT Time Calculation (min): 34 min  PT Assessment / Plan / Recommendation History of Present Illness  Pt admitted s/p fall and Right hip hemiarthroplasty on 02/19/13. RLE WBAT  Clinical Impression  This patient presents with acute pain and decreased functional independence following the above mentioned procedure. At the time of PT eval, pt required min to mod assist for all functional mobility and ambulation. In the middle of ambulation pt began vomiting, and gait training was ended. This patient is appropriate for skilled PT interventions to address functional limitations, improve safety and independence with functional mobility, and return to PLOF.     PT Assessment  Patient needs continued PT services    Follow Up Recommendations  SNF    Does the patient have the potential to tolerate intense rehabilitation      Barriers to Discharge        Equipment Recommendations  Other (comment) (TBD by next venue of care)    Recommendations for Other Services     Frequency Min 5X/week    Precautions / Restrictions Precautions Precautions: Fall;Posterior Hip Precaution Booklet Issued: No Precaution Comments: Discussed posterior hip precautions briefly during functional activity. Pt needs handout. Required Braces or Orthoses: Knee Immobilizer - Right Restrictions Weight Bearing Restrictions: Yes RLE Weight Bearing: Weight bearing as tolerated   Pertinent Vitals/Pain Pt reports moderate pain during transition to EOB      Mobility  Bed Mobility Bed Mobility: Supine to Sit;Sitting - Scoot to Edge of Bed Supine to Sit: 3: Mod assist;HOB flat Sitting - Scoot to Edge of Bed: 4: Min assist;3: Mod assist;With rail Details for Bed Mobility Assistance: VC's for hand placement & safety Transfers Transfers: Sit to Stand;Stand to Sit Sit to Stand: 3: Mod  assist;From bed;With upper extremity assist Stand to Sit: 4: Min assist;To chair/3-in-1;With upper extremity assist Details for Transfer Assistance: VC's for sequencing and safety awareness, hand placement on seated surface prior to initiating transfers. Ambulation/Gait Ambulation/Gait Assistance: 4: Min assist Ambulation Distance (Feet): 10 Feet Assistive device: Rolling walker Ambulation/Gait Assistance Details: VC's for sequencing and safety awareness with the RW. Pt required some assist for walker placement initially and during turns. Cues for foot placement during turns to maintain hip precautions.  Gait Pattern: Step-to pattern;Decreased stride length;Trunk flexed Gait velocity: Decreased Stairs: No    Exercises     PT Diagnosis: Difficulty walking;Acute pain  PT Problem List: Decreased strength;Decreased range of motion;Decreased activity tolerance;Decreased balance;Decreased mobility;Decreased knowledge of use of DME;Decreased knowledge of precautions;Decreased safety awareness;Pain PT Treatment Interventions: DME instruction;Gait training;Stair training;Functional mobility training;Therapeutic activities;Therapeutic exercise;Neuromuscular re-education;Patient/family education     PT Goals(Current goals can be found in the care plan section) Acute Rehab PT Goals Patient Stated Goal: Get better, heal this hip PT Goal Formulation: With patient/family Time For Goal Achievement: 02/27/13 Potential to Achieve Goals: Good  Visit Information  Last PT Received On: 02/20/13 Assistance Needed: +1 PT/OT/SLP Co-Evaluation/Treatment: Yes PT goals addressed during session: Mobility/safety with mobility;Balance;Proper use of DME;Strengthening/ROM OT goals addressed during session: ADL's and self-care;Proper use of Adaptive equipment and DME History of Present Illness: Pt admitted s/p fall and Right hip hemiarthroplasty on 02/19/13. RLE WBAT       Prior Functioning  Home  Living Family/patient expects to be discharged to:: Private residence Living Arrangements: Spouse/significant other Available Help at Discharge: Available 24 hours/day;Family Type of Home: House Home Access: Stairs to enter Entrance  Stairs-Number of Steps: 2 Entrance Stairs-Rails: Left Home Layout: One level Home Equipment: Cane - single point Prior Function Level of Independence: Independent Communication Communication: No difficulties Dominant Hand: Right    Cognition  Cognition Arousal/Alertness: Awake/alert Behavior During Therapy: WFL for tasks assessed/performed Overall Cognitive Status: History of cognitive impairments - at baseline Memory: Decreased short-term memory    Extremity/Trunk Assessment Upper Extremity Assessment Upper Extremity Assessment: Defer to OT evaluation Lower Extremity Assessment Lower Extremity Assessment: RLE deficits/detail RLE Deficits / Details: Decreased strength and AROM consistent with Hemi-arthroplasty RLE: Unable to fully assess due to pain Cervical / Trunk Assessment Cervical / Trunk Assessment: Kyphotic   Balance    End of Session PT - End of Session Equipment Utilized During Treatment: Gait belt;Right knee immobilizer Activity Tolerance: Patient limited by fatigue;Other (comment) (Limited by N/V) Patient left: in chair;with call bell/phone within reach;with family/visitor present Nurse Communication: Mobility status  GP     Ruthann Cancer 02/20/2013, 12:50 PM  Ruthann Cancer, PT, DPT [phone]

## 2013-02-20 NOTE — Progress Notes (Signed)
[facility]                                                                                Patient Demographics  Yvonne Butler, is a 79 y.o. female, DOB - [birth year], NUU:[id number]  Admit date - 02/19/2013   Admitting Physician Edsel Petrin, DO  Outpatient Primary MD for the patient is Pcp Not In System  LOS - 1   Chief Complaint  Patient presents with  . Hip Pain  . Fall        Assessment & Plan  Principal Problem:   Fracture of femoral neck, right Active Problems:   Coronary atherosclerosis of autologous vein bypass graft   Essential hypertension, benign   Other and unspecified hyperlipidemia   Type II or unspecified type diabetes mellitus without mention of complication, uncontrolled   Leukocytosis   Memory loss  Right femoral neck fracture secondary to mechanical fall  -POD 1 from hemi arthroplasty by Dr. Luiz Blare -Continue pain control. -Will consult physical therapy -Will ask case management and social work to look for possible rehabilitation placement  Hypertension  -Controlled -Continue metoprolol and hydralazine when necessary  Diabetes mellitus type 2  -HbA1c 7 -Continue insulin sliding scale and CBG monitoring  Hyperlipidemia  -Continue simvastatin and fenofibrate  History of coronary disease status post CABG  -Patient is currently chest pain-free. Continue to monitor.   Memory loss  -continue Namenda   Leukocytosis  -Trending downward, WBC 10.3 -Likely secondary to acute phase reactant -Patient currently afebrile no source of infection  Code Status: Full  Family Communication: Husband and daughter at bedside  Disposition Plan: Admitted. Will likely need nursing home placement.   Procedures  Right hemi-arthroplasty bipolar hip cemented versus uncemented  Consults   Orthopedics  DVT Prophylaxis  SCDs  Lab Results  Component Value Date   PLT 263 02/20/2013    Medications  Scheduled Meds: . aspirin EC  325 mg Oral BID PC   . fenofibrate  54 mg Oral Daily  . ferrous sulfate  325 mg Oral BID WC  . insulin aspart  0-9 Units Subcutaneous Q4H  . Memantine HCl ER  28 mg Oral Daily  . metoprolol tartrate  25 mg Oral Daily  . simvastatin  20 mg Oral Daily   Continuous Infusions: . sodium chloride 1,000 mL (02/20/13 0639)   PRN Meds:.acetaminophen, acetaminophen, alum & mag hydroxide-simeth, hydrALAZINE, HYDROcodone-acetaminophen, HYDROmorphone (DILAUDID) injection, methocarbamol (ROBAXIN) IV, methocarbamol, ondansetron (ZOFRAN) IV, ondansetron, polyethylene glycol, zolpidem  Antibiotics    Anti-infectives   Start     Dose/Rate Route Frequency Ordered Stop   02/20/13 0000  ceFAZolin (ANCEF) IVPB 2 g/50 mL premix     2 g 100 mL/hr over 30 Minutes Intravenous Every 6 hours 02/19/13 2105 02/20/13 0546   02/19/13 1815  ceFAZolin (ANCEF) IVPB 2 g/50 mL premix     2 g 100 mL/hr over 30 Minutes Intravenous  Once 02/19/13 1810 02/19/13 1745       Time Spent in minutes   30 minutes   Bee Hammerschmidt D.O. on 02/20/2013 at 10:10 AM  Between 7am to 7pm - Pager - [phone]  After 7pm go to [email or web address] - password [facility]  And look for the night coverage person covering for me after hours  [facility] Office  [phone]    Subjective:   [name] seen and examined today.  Patient currently states she has some pain rated as a 3/10.  Patient denies dizziness, chest pain, shortness of breath, abdominal pain, N/V/D/C, new weakness, numbess, tingling.  Of note, husband was in the room and became diaphoretic. Her blood sugar was checked to be around 200  Objective:   Filed Vitals:   02/19/13 2047 02/19/13 2150 02/20/13 0245 02/20/13 0632  BP: 146/47 123/49 118/53 122/49  Pulse: 82 73 75 87  Temp: 97.9 F (36.6 C) 97.6 F (36.4 C) 97.6 F (36.4 C) 97.8 F (36.6 C)  TempSrc: Oral Oral Oral Oral  Resp: 16 16 18 16   Height:      Weight:      SpO2: 93% 100% 100% 100%    Wt Readings  from Last 3 Encounters:  02/19/13 72.122 kg (159 lb)  02/19/13 72.122 kg (159 lb)  01/03/13 73.483 kg (162 lb)     Intake/Output Summary (Last 24 hours) at 02/20/13 1010 Last data filed at 02/20/13 0700  Gross per 24 hour  Intake   3630 ml  Output   2325 ml  Net   1305 ml    Exam General: Well developed, well nourished, NAD, appears stated age  HEENT: NCAT, PERRLA, EOMI, Anicteic Sclera, mucous membranes moist. No pharyngeal erythema or exudates  Neck: Supple, no JVD, no masses,  Cardiovascular: S1 S2 auscultated, no rubs, murmurs or gallops. Regular rate and rhythm.  Respiratory: Clear to auscultation bilaterally with equal chest rise  Abdomen: Soft, nontender, nondistended, + bowel sounds  Extremities: warm dry without cyanosis clubbing or edema. Pain with palpation of the right hip.  Neuro: AAOx3, cranial nerves grossly intact. Strength 5/5 in patient's upper and lower extremities bilaterally  Skin: Without rashes exudates or nodules  Psych: Normal affect and demeanor with intact judgement and insight  Data Review   Micro Results Recent Results (from the past 240 hour(s))  SURGICAL PCR SCREEN     Status: None   Collection Time    02/19/13  4:32 PM      Result Value Range Status   MRSA, PCR NEGATIVE  NEGATIVE Final   Staphylococcus aureus NEGATIVE  NEGATIVE Final   Comment:            The Xpert SA Assay (FDA     approved for NASAL specimens     in patients over 35 years of age),     is one component of     a comprehensive surveillance     program.  Test performance has     been validated by [employer] for patients greater     than or equal to 69 year old.     It is not intended     to diagnose infection nor to     guide or monitor treatment.    Radiology Reports Dg Chest 1 View  02/19/2013   CLINICAL DATA:  Right hip pain after fall.  EXAM: CHEST - 1 VIEW  COMPARISON:  January 06, 2008.  FINDINGS: Status post coronary artery bypass graft. Stable  cardiomediastinal silhouette. No pneumothorax or pleural effusion is noted. No acute pulmonary disease is noted. Bony thorax is intact.  IMPRESSION: No acute cardiopulmonary abnormality seen.   Electronically Signed   By: Roque Lias [name]   On: 02/19/2013 12:26  Dg Hip Complete Right  02/19/2013   CLINICAL DATA:  Fall  EXAM: RIGHT HIP - COMPLETE 2+ VIEW  COMPARISON:  None.  FINDINGS: Three views of the right hip submitted. There is displaced fracture of the right femoral neck.  IMPRESSION: Mild displaced fracture of the right femoral neck.   Electronically Signed   By: Natasha Mead [name]   On: 02/19/2013 12:27   Dg Pelvis Portable  02/20/2013   CLINICAL DATA:  Postoperative radiographs; status post right hip hemiarthroplasty.  EXAM: PORTABLE PELVIS 1-2 VIEWS  COMPARISON:  None.  FINDINGS: There is no evidence of fracture or dislocation. The patient is status post right hip hemiarthroplasty; the prosthesis is unremarkable in appearance, without evidence of fracture or loosening. No significant degenerative change is appreciated. The sacroiliac joints are unremarkable in appearance.  The visualized bowel gas pattern is grossly unremarkable in appearance. Scattered postoperative soft tissue air is noted on the right side, with overlying skin staples.  IMPRESSION: Status post right hip hemiarthroplasty; the prosthesis is unremarkable in appearance, without evidence of fracture or loosening.   Electronically Signed   By: Roanna Raider [name]   On: 02/20/2013 00:22   Dg Hip Portable 1 View Right  02/20/2013   CLINICAL DATA:  Postoperative radiographs, status post right hip hemiarthroplasty.  EXAM: PORTABLE RIGHT HIP - 1 VIEW  COMPARISON:  None.  FINDINGS: A single cross-table lateral view demonstrates the patient's right hip hemiarthroplasty, without evidence of fracture or loosening. Overlying soft tissue air is seen.  IMPRESSION: Right hip hemiarthroplasty noted, without evidence of fracture or loosening.    Electronically Signed   By: Roanna Raider [name]   On: 02/20/2013 00:23    CBC  Recent Labs Lab 02/19/13 1139 02/20/13 0550  WBC 12.8* 10.3  HGB 13.0 10.0*  HCT 38.8 30.3*  PLT 333 263  MCV 95.6 96.2  MCH 32.0 31.7  MCHC 33.5 33.0  RDW 14.1 14.6  LYMPHSABS 1.5  --   MONOABS 0.8  --   EOSABS 0.2  --   BASOSABS 0.0  --     Chemistries   Recent Labs Lab 02/19/13 1139 02/20/13 0550  NA 139 138  K 3.8 4.1  CL 101 103  CO2 27 25  GLUCOSE 131* 166*  BUN 18 12  CREATININE 0.85 0.73  CALCIUM 9.5 8.2*   ------------------------------------------------------------------------------------------------------------------ estimated creatinine clearance is 57.4 ml/min (by C-G formula based on Cr of 0.73). ------------------------------------------------------------------------------------------------------------------  Recent Labs  02/19/13 1139  HGBA1C 7.0*   ------------------------------------------------------------------------------------------------------------------ No results found for this basename: CHOL, HDL, LDLCALC, TRIG, CHOLHDL, LDLDIRECT,  in the last 72 hours ------------------------------------------------------------------------------------------------------------------ No results found for this basename: TSH, T4TOTAL, FREET3, T3FREE, THYROIDAB,  in the last 72 hours ------------------------------------------------------------------------------------------------------------------ No results found for this basename: VITAMINB12, FOLATE, FERRITIN, TIBC, IRON, RETICCTPCT,  in the last 72 hours  Coagulation profile  Recent Labs Lab 02/19/13 1139  INR 0.88    No results found for this basename: DDIMER,  in the last 72 hours  Cardiac Enzymes No results found for this basename: CK, CKMB, TROPONINI, MYOGLOBIN,  in the last 168 hours ------------------------------------------------------------------------------------------------------------------ No components  found with this basename: POCBNP,

## 2013-02-20 NOTE — Progress Notes (Signed)
Patient's husband was feeling diaphoretic and as if his blood sugar was low. Dr. Catha Gosselin ordered the RN to check the husband's CBG and report the results to the MD. The patient's armband was used to scan for the CBG check, since the husband was visiting and not admitted and not wearing an armband.

## 2013-02-20 NOTE — Progress Notes (Signed)
UR completed 

## 2013-02-20 NOTE — Op Note (Signed)
NAME:  Yvonne Butler, Yvonne Butler NO.:  [id number]  MEDICAL RECORD NO.:  [id number]  LOCATION:  5N30C                        FACILITY:  [facility]  PHYSICIAN:  Harvie Junior, [name]   DATE OF BIRTH:  09-23-1933  DATE OF PROCEDURE:  02/19/2013 DATE OF DISCHARGE:                              OPERATIVE REPORT   PREOPERATIVE DIAGNOSIS:  Femoral neck fracture, right.  POSTOPERATIVE DIAGNOSIS:  Femoral neck fracture, right.  PROCEDURE:  Right hemiarthroplasty with a cemented size 3 Summit basic stem with a 43-mm +0 hip ball, monopolar.  SURGEON:  Harvie Junior, MD  ASSISTANT:  Marshia Ly, PA  ANESTHESIA:  General.  BRIEF HISTORY:  Yvonne Butler is a 79 year old female with a history of having a fall earlier today.  She was taken to the emergency room where she was noted to have a femoral neck fracture.  She was admitted to Medicine, and we were consulted.  After clearance from Internal Medicine Service, she was taken to the operating room for right hemiarthroplasty.  DESCRIPTION OF PROCEDURE:  The patient was taken to the operating room. After adequate anesthesia was obtained with general anesthetic, the patient was placed supine on the operating room table.  The right hip was then prepped and draped in usual sterile fashion.  After she had been moved into the left lateral decubitus position, all bony prominences have been well padded, and hip position __________ placed. Once she was prepped and draped in usual fashion, an incision was made for a posterior approach to the hip, subcutaneous tissue down to the level __________ of the tensor fascia which was divided in line with its fibers and the gluteus max was finger fractured and the IT band was split distally.  Charnley retractors put in place.  The hip was internally rotated.  Retractors were placed.  The piriformis, short external rotators were taken down as a unit and tagged.  Following this, a provisional neck cut was  made, and the head neck segment was removed. Acetabulum was checked and noted to be satisfactory and at this point, the cookie cutter, lateralizer, and femoral introducer were all used, and then the neck was sequentially reamed up to 3, it was really tight getting the 3 down, but it did not really have good proximal fit that I want.  We started with a 4, but it was going to be way too tight, and I worried about fracture.  At that point, I felt we needed to cement the 3 in, so we got the cementing tools, measured distally and put in a cement restrictor distally and got cementralizer out and got a size 3 Summit basic stem out.  At this point, the canal was irrigated, suctioned dry, and then the cement was mixed on the back table.  Once this was done, the canal was filled with cement and then pressurized and then the stem size 3 with an 11-mm centralizer was placed in appropriate anteversion. Once this was allowed to harden, all excess cement was removed.  Cement was allowed to completely harden and a +0 monopolar trial was trialed which had been trialed on the trial broach and this again gave excellent stability and  range of motion.  At this point, the final +0 was opened and placed, and the short external rotators and piriformis were then repaired to the posterior intertrochanteric line through drill holes after the hip was copiously and thoroughly irrigated.  Tensor fascia was closed with 1 Vicryl, skin with 0 and 2-0 Vicryl and skin staples.  Sterile compressive dressing was applied.  The patient was taken to the recovery room, she was noted to be in satisfactory condition.  Estimated blood loss for the procedure was 300 mL.     Harvie Junior, [name]     Ranae Plumber  D:  02/19/2013  T:  02/20/2013  Job:  [id number]

## 2013-02-20 NOTE — Progress Notes (Signed)
Subjective: 1 Day Post-Op Procedure(s) (LRB): HEMI ARTHROPLASTY BIPOLAR HIP CEMENTED VS UNCEMENTED (Right) Patient reports pain as 3 on 0-10 scale.   Not OOB yet. Taking po fluids ok.  Objective: Vital signs in last 24 hours: Temp:  [97.6 F (36.4 C)-98.1 F (36.7 C)] 97.8 F (36.6 C) (12/08 1610) Pulse Rate:  [62-87] 87 (12/08 0632) Resp:  [13-20] 16 (12/08 0632) BP: (118-201)/(47-74) 122/49 mmHg (12/08 0632) SpO2:  [93 %-100 %] 100 % (12/08 9604) Weight:  [72.122 kg (159 lb)] 72.122 kg (159 lb) (12/07 1856)  Intake/Output from previous day: 12/07 0701 - 12/08 0700 In: 3630 [P.O.:480; I.V.:2900; IV Piggyback:250] Out: 2325 [Urine:2075; Blood:250] Intake/Output this shift:     Recent Labs  02/19/13 1139 02/20/13 0550  HGB 13.0 10.0*    Recent Labs  02/19/13 1139 02/20/13 0550  WBC 12.8* 10.3  RBC 4.06 3.15*  HCT 38.8 30.3*  PLT 333 263    Recent Labs  02/19/13 1139 02/20/13 0550  NA 139 138  K 3.8 4.1  CL 101 103  CO2 27 25  BUN 18 12  CREATININE 0.85 0.73  GLUCOSE 131* 166*  CALCIUM 9.5 8.2*    Recent Labs  02/19/13 1139  INR 0.88  Right hip exam:  Neurovascular intact Intact pulses distally Dorsiflexion/Plantar flexion intact Incision: dressing C/D/I Compartment soft  Assessment/Plan: 1 Day Post-Op Procedure(s) (LRB): HEMI ARTHROPLASTY BIPOLAR HIP CEMENTED VS UNCEMENTED (Right) Plan:  Up with therapy WBAT on Right   Posterior hip precautions. ASA 325mg  BID/SCDs for DVT prophylaxis. Will see how she progresses with PT and consider SNF vs Home with HHPT. Discussed this with pt and her family.  Giovannina Mun G 02/20/2013, 8:04 AM

## 2013-02-21 ENCOUNTER — Inpatient Hospital Stay (HOSPITAL_COMMUNITY): Payer: Medicare Other

## 2013-02-21 ENCOUNTER — Encounter (HOSPITAL_COMMUNITY): Payer: Self-pay | Admitting: General Practice

## 2013-02-21 DIAGNOSIS — M174 Other bilateral secondary osteoarthritis of knee: Secondary | ICD-10-CM | POA: Diagnosis present

## 2013-02-21 LAB — GLUCOSE, CAPILLARY
Glucose-Capillary: 130 mg/dL — ABNORMAL HIGH (ref 70–99)
Glucose-Capillary: 133 mg/dL — ABNORMAL HIGH (ref 70–99)
Glucose-Capillary: 147 mg/dL — ABNORMAL HIGH (ref 70–99)
Glucose-Capillary: 149 mg/dL — ABNORMAL HIGH (ref 70–99)
Glucose-Capillary: 173 mg/dL — ABNORMAL HIGH (ref 70–99)

## 2013-02-21 LAB — CBC
MCH: 32.1 pg (ref 26.0–34.0)
MCHC: 33.5 g/dL (ref 30.0–36.0)
MCV: 95.7 fL (ref 78.0–100.0)
RBC: 3.27 MIL/uL — ABNORMAL LOW (ref 3.87–5.11)
RDW: 14.3 % (ref 11.5–15.5)

## 2013-02-21 MED ORDER — POLYETHYLENE GLYCOL 3350 17 G PO PACK: 17.0000 g | PACK | Freq: Every day | ORAL | Status: DC | PRN

## 2013-02-21 MED ORDER — FERROUS SULFATE 325 (65 FE) MG PO TABS: 325.0000 mg | ORAL_TABLET | Freq: Two times a day (BID) | ORAL | Status: DC

## 2013-02-21 MED ORDER — METHOCARBAMOL 500 MG PO TABS: 500.0000 mg | ORAL_TABLET | Freq: Four times a day (QID) | ORAL | Status: DC | PRN

## 2013-02-21 NOTE — Progress Notes (Signed)
Clinical Social Work Department BRIEF PSYCHOSOCIAL ASSESSMENT 02/21/2013  Patient:  Yvonne Butler, Yvonne Butler     Account Number:  [id number]     Admit date:  02/19/2013  Clinical Social Worker:  Harless Nakayama  Date/Time:  02/21/2013 10:00 AM  Referred by:  Physician  Date Referred:  02/21/2013 Referred for  SNF Placement   Other Referral:   Interview type:  Patient Other interview type:   Spoke with pt and pt family at bedside    PSYCHOSOCIAL DATA Living Status:  HUSBAND Admitted from facility:   Level of care:   Primary support name:  Beronica Lansdale [phone] Primary support relationship to patient:  SPOUSE Degree of support available:   Pt has very supportive family    CURRENT CONCERNS Current Concerns  Post-Acute Placement   Other Concerns:    SOCIAL WORK ASSESSMENT / PLAN CSW spoke with pt and pt family about recommendation for SNF. They were already aware and informed CSW that pt mother has been to [place] a couple of times and that is their first choice. CSW explained SNF referal process. Pt and pt family are agreeable to being faxed out to [facility] a back up facility is needed. Pt family had questions about insurance coverage. CSW explained that facilities will look at insurance before making a bed offer to make sure they are contracted with pt insurance plan. CSW to connect family with chosen facility as soon as possible so family can inquire about co-pay amount.   Assessment/plan status:  Psychosocial Support/Ongoing Assessment of Needs Other assessment/ plan:   Information/referral to community resources:   SNF list    PATIENT'S/FAMILY'S RESPONSE TO PLAN OF CARE: Pt and pt family agreeable to SNF for ST rehab       Sharol Harness, LCSWA [phone]

## 2013-02-21 NOTE — Progress Notes (Signed)
Physical Therapy Treatment Patient Details Name: Yvonne Butler MRN: [id number] DOB: 24-Jul-1933 Today's Date: 02/21/2013 Time: 4098-1191 PT Time Calculation (min): 28 min  PT Assessment / Plan / Recommendation  History of Present Illness Pt admitted s/p fall and Right hip hemiarthroplasty on 02/19/13. RLE WBAT   PT Comments   Pt progressing with mobility.  Requires min encouragement throughout session.  Cont with current POC.     Follow Up Recommendations  SNF     Does the patient have the potential to tolerate intense rehabilitation     Barriers to Discharge        Equipment Recommendations       Recommendations for Other Services    Frequency Min 5X/week   Progress towards PT Goals Progress towards PT goals: Progressing toward goals  Plan Current plan remains appropriate    Precautions / Restrictions Precautions Precautions: Fall;Posterior Hip Precaution Comments: Pt unable to recall any of the 3 hip precautions.  Reviewed all 3 Required Braces or Orthoses: Knee Immobilizer - Right Restrictions RLE Weight Bearing: Weight bearing as tolerated   Pertinent Vitals/Pain "it's terrible" in response to asking about pain but did not rate.  Repositioned for comfort.  Pt also c/o nausea.  RN notified.      Mobility  Bed Mobility Bed Mobility: Not assessed Transfers Transfers: Sit to Stand;Stand to Sit Sit to Stand: 4: Min assist;With upper extremity assist;With armrests;From chair/3-in-1 Stand to Sit: 4: Min assist;With upper extremity assist;With armrests;To chair/3-in-1 Details for Transfer Assistance: cues to reinforce hand placement & RLE positioning before sitting Ambulation/Gait Ambulation/Gait Assistance: 4: Min guard Ambulation Distance (Feet): 40 Feet Assistive device: Rolling walker Ambulation/Gait Assistance Details: cues for sequencing, RW advancement, & encouragement to increase RLE WBing during stance phase Gait Pattern: Step-to pattern;Decreased weight shift  to right;Decreased step length - left;Antalgic Gait velocity: decreased Stairs: No    Exercises Total Joint Exercises Ankle Circles/Pumps: AROM;Both;10 reps Heel Slides: AAROM;Strengthening;Right;10 reps Hip ABduction/ADduction: AAROM;Strengthening;Right;10 reps Straight Leg Raises: AAROM;Strengthening;Right;10 reps Long Arc Quad: AROM;Strengthening;Right;10 reps     PT Goals (current goals can now be found in the care plan section) Acute Rehab PT Goals Patient Stated Goal: Get better, heal this hip PT Goal Formulation: With patient/family Time For Goal Achievement: 02/27/13 Potential to Achieve Goals: Good  Visit Information  Last PT Received On: 02/21/13 Assistance Needed: +1 History of Present Illness: Pt admitted s/p fall and Right hip hemiarthroplasty on 02/19/13. RLE WBAT    Subjective Data  Patient Stated Goal: Get better, heal this hip   Cognition  Cognition Arousal/Alertness: Awake/alert Behavior During Therapy: WFL for tasks assessed/performed Overall Cognitive Status: History of cognitive impairments - at baseline Memory: Decreased short-term memory    Balance     End of Session PT - End of Session Equipment Utilized During Treatment: Gait belt Activity Tolerance: Patient tolerated treatment well Patient left: in chair;with call bell/phone within reach;with family/visitor present Nurse Communication: Mobility status   GP     Lara Mulch 02/21/2013, 11:54 AM  Verdell Face, PTA [phone] 02/21/2013

## 2013-02-21 NOTE — Progress Notes (Signed)
The patient is fitted with bilateral hinged neoprene knee braces for support. She needs to wear them when walking.

## 2013-02-21 NOTE — Discharge Summary (Addendum)
Physician Discharge Summary  Yvonne Butler WUJ:[id number] DOB: 07/22/1933 DOA: 02/19/2013  PCP: Pcp Not In System  Admit date: 02/19/2013 Discharge date: 02/21/2013  Time spent: 35 minutes  Recommendations for Outpatient Follow-up:  Patient will be discharged to the nursing home. She should followup with her primary care physician within one week of discharge he should also follow Dr. Luiz Blare at the specified time. She should continue physical therapy as well as occupational therapy per the rehabilitation center. Patient should continue her medications as prescribed. She should also have her CBC monitored within 3-5 days of discharge.  Discharge Diagnoses:  Principal Problem:   Fracture of femoral neck, right Active Problems:   Coronary atherosclerosis of autologous vein bypass graft   Essential hypertension, benign   Other and unspecified hyperlipidemia   Type II or unspecified type diabetes mellitus without mention of complication, uncontrolled   Leukocytosis   Memory loss   Other bilateral secondary osteoarthritis of knee   Discharge Condition: Stable  Diet recommendation: Heart healthy  Filed Weights   02/19/13 1856  Weight: 72.122 kg (159 lb)    History of present illness:  Yvonne Butler is a 79 y.o. female With a history of coronary artery disease, hyperlipidemia, diabetes or presented to the emergency room after falling. Patient is currently having right hip pain. Patient states she gone out of the car at which point she take approximately 4 steps and then fell. She stated that her leg gave way. She denies any dizziness any headache, chest pain, shortness of breath, abdominal pain. Patient states that she has not fallen before and has not had problems with ambulation.   Hospital Course:  This is a 79 year old female with a history of coronary artery disease, hypertension, hyperlipidemia that presents to the emergency room after falling. She is on her way to church at  which point she does have her coarctation approximately 4 steps and then fell. She stated she did not remember tripping over anything, being dizzy or lightheaded. Patient stated her "leg just gave way" and she fell.  Patient was admitted and found to have a right femoral fracture. Dr. Luiz Blare, orthopedist, was consulted for surgical intervention. Patient underwent right hemiarthroplasty. Physical therapy was also consulted and recommended nursing home placement for rehabilitation.  Patient was also noted to have hypertension which is possibly secondary to pain. We did continue her on metoprolol as well as hydralazine as needed. Patient also has a history of diabetes mellitus type 2.  Patient has hemoglobin A1c of 7. She was placed on insulin sliding scale with CBG monitoring. Patient also has a history of hyperlipidemia was continued on her simvastatin as well as fenofibrate. As for her history of coronary artery disease, patient remained chest pain-free during her hospital course. Patient was also noted to have some leukocytosis upon admission. This is likely secondary to an acute phase reactant. Patient did remain afebrile during her hospital course, and did not have any source of infection. Her urinalysis remained negative as well as her chest x-ray.  Patient also has a history of memory loss, her Yvonne Butler was continued. On day of discharge patient did still complain of some pain noted in her right lower extremity. She also had some neck pain as well. Patient had no problems with range of motion of the head neck. Her neck pain is probably secondary to a muscle strain. Neck x-ray was conducted and negative for acute osseous abnormality.  Moderate cervical spondylosis.  Patient will be discharged to nursing  home. She is to follow physical therapy as well as occupational therapy per the nursing home and rehabilitation. She should follow with her primary care physician within one week of discharge as well as to Dr.  Luiz Blare at the specified time. Patient to continue taking her medications as prescribed. This was discussed with patient and her family at bedside and they did understand and agree.  Procedures: Right hemiarthroplasty of the hip  Consultations: Orthopedics  Discharge Exam: Filed Vitals:   02/21/13 1433  BP: 142/49  Pulse: 98  Temp: 98.9 F (37.2 C)  Resp: 16   Exam  General: Well developed, well nourished, NAD, appears stated age  HEENT: NCAT, PERRLA Neck: Supple, no JVD, no masses Cardiovascular: S1 S2 auscultated, no rubs, murmurs or gallops. Regular rate and rhythm.  Respiratory: Clear to auscultation bilaterally with equal chest rise  Abdomen: Soft, nontender, nondistended, + bowel sounds  Extremities: warm dry without cyanosis clubbing or edema. Pain with palpation of the right hip. Dressing noted to be clean. Neuro: AAOx3, cranial nerves grossly intact.   Skin: Without rashes exudates or nodules  Psych: Normal affect and demeanor with intact judgement and insight  Discharge Instructions      Discharge Orders   Future Appointments Provider Department Dept Phone   06/21/2013 8:10 AM [facility] Office [phone]   Future Orders Complete By Expires   Diet - low sodium heart healthy  As directed    Discharge instructions  As directed    Comments:     Patient will be discharged to the nursing home. She should followup with her primary care physician within one week of discharge he should also follow Dr. Luiz Blare at the specified time. She should continue physical therapy as well as occupational therapy per the rehabilitation center. Patient should continue her medications as prescribed. She should also have her CBC monitored within 3-5 days of discharge.   Increase activity slowly  As directed    Weight bearing as tolerated  As directed    Questions:     Laterality:  right   Extremity:  Lower       Medication List         aspirin EC 325 MG  tablet  Take 1 tablet (325 mg total) by mouth 2 (two) times daily after a meal.     donepezil 5 MG tablet  Commonly known as:  ARICEPT  Take 5 mg by mouth at bedtime.     Fenofibric Acid 45 MG Cpdr  Take 45 mg by mouth daily.     ferrous sulfate 325 (65 FE) MG tablet  Take 1 tablet (325 mg total) by mouth 2 (two) times daily with a meal.     HYDROcodone-acetaminophen 5-325 MG per tablet  Commonly known as:  NORCO  Take 1 tablet by mouth every 6 (six) hours as needed for moderate pain.     methocarbamol 500 MG tablet  Commonly known as:  ROBAXIN  Take 1 tablet (500 mg total) by mouth every 6 (six) hours as needed for muscle spasms.     metoprolol tartrate 25 MG tablet  Commonly known as:  LOPRESSOR  Take 25 mg by mouth 2 (two) times daily.     polyethylene glycol packet  Commonly known as:  MIRALAX / GLYCOLAX  Take 17 g by mouth daily as needed for mild constipation.     simvastatin 20 MG tablet  Commonly known as:  ZOCOR  Take 20 mg by mouth  daily.       No Known Allergies Follow-up Information   Follow up with GRAVES,JOHN L, MD. Schedule an appointment as soon as possible for a visit in 2 weeks.   Specialty:  Orthopedic Surgery   Contact information:   [address] [place] [address] [phone]       Follow up with Primary Care Physician, Dr. Gust Brooms. Schedule an appointment as soon as possible for a visit in 1 week.       The results of significant diagnostics from this hospitalization (including imaging, microbiology, ancillary and laboratory) are listed below for reference.    Significant Diagnostic Studies: Dg Chest 1 View  02/19/2013   CLINICAL DATA:  Right hip pain after fall.  EXAM: CHEST - 1 VIEW  COMPARISON:  January 06, 2008.  FINDINGS: Status post coronary artery bypass graft. Stable cardiomediastinal silhouette. No pneumothorax or pleural effusion is noted. No acute pulmonary disease is noted. Bony thorax is intact.  IMPRESSION: No acute  cardiopulmonary abnormality seen.   Electronically Signed   By: Roque Lias [name]   On: 02/19/2013 12:26   Dg Hip Complete Right  02/19/2013   CLINICAL DATA:  Fall  EXAM: RIGHT HIP - COMPLETE 2+ VIEW  COMPARISON:  None.  FINDINGS: Three views of the right hip submitted. There is displaced fracture of the right femoral neck.  IMPRESSION: Mild displaced fracture of the right femoral neck.   Electronically Signed   By: Natasha Mead [name]   On: 02/19/2013 12:27   Dg Pelvis Portable  02/20/2013   CLINICAL DATA:  Postoperative radiographs; status post right hip hemiarthroplasty.  EXAM: PORTABLE PELVIS 1-2 VIEWS  COMPARISON:  None.  FINDINGS: There is no evidence of fracture or dislocation. The patient is status post right hip hemiarthroplasty; the prosthesis is unremarkable in appearance, without evidence of fracture or loosening. No significant degenerative change is appreciated. The sacroiliac joints are unremarkable in appearance.  The visualized bowel gas pattern is grossly unremarkable in appearance. Scattered postoperative soft tissue air is noted on the right side, with overlying skin staples.  IMPRESSION: Status post right hip hemiarthroplasty; the prosthesis is unremarkable in appearance, without evidence of fracture or loosening.   Electronically Signed   By: Roanna Raider [name]   On: 02/20/2013 00:22   Dg Hip Portable 1 View Right  02/20/2013   CLINICAL DATA:  Postoperative radiographs, status post right hip hemiarthroplasty.  EXAM: PORTABLE RIGHT HIP - 1 VIEW  COMPARISON:  None.  FINDINGS: A single cross-table lateral view demonstrates the patient's right hip hemiarthroplasty, without evidence of fracture or loosening. Overlying soft tissue air is seen.  IMPRESSION: Right hip hemiarthroplasty noted, without evidence of fracture or loosening.   Electronically Signed   By: Roanna Raider [name]   On: 02/20/2013 00:23    Microbiology: Recent Results (from the past 240 hour(s))  SURGICAL PCR SCREEN      Status: None   Collection Time    02/19/13  4:32 PM      Result Value Range Status   MRSA, PCR NEGATIVE  NEGATIVE Final   Staphylococcus aureus NEGATIVE  NEGATIVE Final   Comment:            The Xpert SA Assay (FDA     approved for NASAL specimens     in patients over 16 years of age),     is one component of     a comprehensive surveillance     program.  Test performance has  been validated by [facility] for patients greater     than or equal to 77 year old.     It is not intended     to diagnose infection nor to     guide or monitor treatment.     Labs: Basic Metabolic Panel:  Recent Labs Lab 02/19/13 1139 02/20/13 0550  NA 139 138  K 3.8 4.1  CL 101 103  CO2 27 25  GLUCOSE 131* 166*  BUN 18 12  CREATININE 0.85 0.73  CALCIUM 9.5 8.2*   Liver Function Tests: No results found for this basename: AST, ALT, ALKPHOS, BILITOT, PROT, ALBUMIN,  in the last 168 hours No results found for this basename: LIPASE, AMYLASE,  in the last 168 hours No results found for this basename: AMMONIA,  in the last 168 hours CBC:  Recent Labs Lab 02/19/13 1139 02/20/13 0550 02/21/13 0745  WBC 12.8* 10.3 17.6*  NEUTROABS 10.1*  --   --   HGB 13.0 10.0* 10.5*  HCT 38.8 30.3* 31.3*  MCV 95.6 96.2 95.7  PLT 333 263 214   Cardiac Enzymes: No results found for this basename: CKTOTAL, CKMB, CKMBINDEX, TROPONINI,  in the last 168 hours BNP: BNP (last 3 results) No results found for this basename: PROBNP,  in the last 8760 hours CBG:  Recent Labs Lab 02/21/13 0039 02/21/13 0352 02/21/13 0742 02/21/13 1134 02/21/13 1605  GLUCAP 173* 133* 147* 130* 149*       Signed:  Kaleigha Chamberlin  [facility] 02/21/2013, 4:22 PM

## 2013-02-21 NOTE — Progress Notes (Addendum)
Clinical Social Work Department CLINICAL SOCIAL WORK PLACEMENT NOTE 02/21/2013  Patient:  Yvonne Butler, Yvonne Butler  Account Number:  [id number] Admit date:  02/19/2013  Clinical Social Worker:  Sharol Harness, Theresia Majors  Date/time:  02/21/2013 10:00 AM  Clinical Social Work is seeking post-discharge placement for this patient at the following level of care:   SKILLED NURSING   (*CSW will update this form in Epic as items are completed)   02/21/2013  Patient/family provided with [facility] Department of Clinical Social Work's list of facilities offering this level of care within the geographic area requested by the patient (or if unable, by the patient's family).  02/21/2013  Patient/family informed of their freedom to choose among providers that offer the needed level of care, that participate in Medicare, Medicaid or managed care program needed by the patient, have an available bed and are willing to accept the patient.  02/21/2013  Patient/family informed of [facility]' ownership interest in [facility], as well as of the fact that they are under no obligation to receive care at this facility.  PASARR submitted to EDS on 02/21/2013 PASARR number received from EDS on 02/21/2013  FL2 transmitted to all facilities in geographic area requested by pt/family on  02/21/2013 FL2 transmitted to all facilities within larger geographic area on   Patient informed that his/her managed care company has contracts with or will negotiate with  certain facilities, including the following:     Patient/family informed of bed offers received:  02/21/2013 Patient chooses bed at [facility] Physician recommends and patient chooses bed at    Patient to be transferred to  [facility] on  02/21/2013 Patient to be transferred to facility by [facility]  The following physician request were entered in Epic:   Additional Comments:  Jorgina Binning, LCSWA [phone]

## 2013-02-21 NOTE — Progress Notes (Signed)
CSW (Clinical Social Worker) prepared pt dc packet and placed with shadow chart. CSW arranged non-emergent ambulance transport. Pt, pt family, pt nurse, and facility informed. CSW signing off.  Somtochukwu Woollard, LCSWA [phone]  

## 2013-02-21 NOTE — Progress Notes (Signed)
Called report to [facility] ambulance called by Child psychotherapist.

## 2013-02-21 NOTE — Progress Notes (Signed)
Subjective: 2 Days Post-Op Procedure(s) (LRB): HEMI ARTHROPLASTY BIPOLAR HIP CEMENTED VS UNCEMENTED (Right) Patient reports pain as 3 on 0-10 scale.   The patient complains of bilateral knee pain and weakness when she gets up.  She has a past history of knee arthroscopies and has osteoarthritis.  She is asking about getting knee braces to wear when up. Objective: Vital signs in last 24 hours: Temp:  [98.3 F (36.8 C)-99 F (37.2 C)] 99 F (37.2 C) (12/08 2113) Pulse Rate:  [95-106] 106 (12/08 2113) Resp:  [16] 16 (12/08 2113) BP: (162-194)/(49-64) 194/64 mmHg (12/09 0341) SpO2:  [95 %] 95 % (12/08 2113)  Intake/Output from previous day: 12/08 0701 - 12/09 0700 In: -  Out: 1650 [Urine:1650] Intake/Output this shift:     Recent Labs  02/19/13 1139 02/20/13 0550 02/21/13 0745  HGB 13.0 10.0* 10.5*    Recent Labs  02/20/13 0550 02/21/13 0745  WBC 10.3 17.6*  RBC 3.15* 3.27*  HCT 30.3* 31.3*  PLT 263 214    Recent Labs  02/19/13 1139 02/20/13 0550  NA 139 138  K 3.8 4.1  CL 101 103  CO2 27 25  BUN 18 12  CREATININE 0.85 0.73  GLUCOSE 131* 166*  CALCIUM 9.5 8.2*    Recent Labs  02/19/13 1139  INR 0.88  right hip exam:  Neurovascular intact Sensation intact distally Intact pulses distally Dorsiflexion/Plantar flexion intact Incision: dressing C/D/I Compartment soft Bilateral knee exams: Diffuse tenderness of both knees.  Range of motion is 0 to 115.  Mild effusion of the right knee.no ligamentous instability. Assessment/Plan: 2 Days Post-Op Procedure(s) (LRB): HEMI ARTHROPLASTY BIPOLAR HIP CEMENTED VS UNCEMENTED (Right) Osteoarthritis bilateral knees. Plan: Up with therapy Discharge to SNF tomorrow I will bring bilateral knee braces from the office and fit them on the patient.  She will only need to wear them when she is up.I'll either bring them over today or have Dr. Luiz Blare bring him first thing in the a.m.  Jami Ohlin G 02/21/2013, 9:17  AM

## 2013-02-21 NOTE — Care Management Note (Signed)
Case Manager spoke with Lynden Ang, patient's daughter. She had concerns about DME for patient after discharge. CM explained that the social worker at SNF will handle that for them. No further needs from this case manager. Vance Peper, RN BSN

## 2013-02-21 NOTE — Progress Notes (Signed)
CSW (Clinical Child psychotherapist) received call from [facility] with a bed offer and requesting for family to be at facility at 1pm today to complete paperwork. CSW called pt daughter Olegario Messier [phone]) as requested and updated. Pt daughter unaware of pt dc today. CSW did clarify with MD that pt is ready to dc. CSW discussed transport with pt daughter. Pt will be using non-emergent ambulance transport. CSW to arrange when appropriate. CSW will also call PA to notify that pt will dc today not tomorrow.  Nakeda Lebron, LCSWA [phone]

## 2013-04-14 ENCOUNTER — Encounter (INDEPENDENT_AMBULATORY_CARE_PROVIDER_SITE_OTHER): Payer: Medicare Other | Admitting: Ophthalmology

## 2013-04-14 DIAGNOSIS — H43819 Vitreous degeneration, unspecified eye: Secondary | ICD-10-CM

## 2013-04-14 DIAGNOSIS — H353290 Exudative age-related macular degeneration, unspecified eye, stage unspecified: Secondary | ICD-10-CM

## 2013-05-11 ENCOUNTER — Encounter (INDEPENDENT_AMBULATORY_CARE_PROVIDER_SITE_OTHER): Payer: Medicare Other | Admitting: Ophthalmology

## 2013-06-15 ENCOUNTER — Other Ambulatory Visit: Payer: Self-pay | Admitting: *Deleted

## 2013-06-15 DIAGNOSIS — E785 Hyperlipidemia, unspecified: Secondary | ICD-10-CM

## 2013-06-21 ENCOUNTER — Other Ambulatory Visit (INDEPENDENT_AMBULATORY_CARE_PROVIDER_SITE_OTHER): Payer: Medicare Other

## 2013-06-21 DIAGNOSIS — E785 Hyperlipidemia, unspecified: Secondary | ICD-10-CM

## 2013-06-21 LAB — LIPID PANEL
CHOL/HDL RATIO: 3
Cholesterol: 203 mg/dL — ABNORMAL HIGH (ref 0–200)
HDL: 71.9 mg/dL (ref >39.00)
LDL CALC: 113 mg/dL — AB (ref 0–99)
Triglycerides: 91 mg/dL (ref 0.0–149.0)
VLDL: 18.2 mg/dL (ref 0.0–40.0)

## 2013-06-21 LAB — HEPATIC FUNCTION PANEL
ALT: 14 U/L (ref 0–35)
AST: 18 U/L (ref 0–37)
Albumin: 3.9 g/dL (ref 3.5–5.2)
Alkaline Phosphatase: 55 U/L (ref 39–117)
BILIRUBIN DIRECT: 0.1 mg/dL (ref 0.0–0.3)
Total Bilirubin: 0.7 mg/dL (ref 0.3–1.2)
Total Protein: 7.1 g/dL (ref 6.0–8.3)

## 2013-06-29 ENCOUNTER — Telehealth: Payer: Self-pay

## 2013-06-29 DIAGNOSIS — E785 Hyperlipidemia, unspecified: Secondary | ICD-10-CM

## 2013-06-29 NOTE — Telephone Encounter (Signed)
Message copied by Jarvis NewcomerPARRIS-GODLEY, Vontae Court S on Thu Jun 29, 2013  3:41 PM ------      Message from: Verdis PrimeSMITH, HENRY      Created: Thu Jun 22, 2013  4:19 PM       Not ideal. Need to decrease fat in diet and exercise. No change in med dose.repeat in 6-12 months ------

## 2013-06-29 NOTE — Telephone Encounter (Signed)
pt given lab results.Not ideal. Need to decrease fat in diet and exercise. No change in med dose.repeat in 6-12 months.pt verba;ized understanding.

## 2013-07-10 ENCOUNTER — Encounter: Payer: Self-pay | Admitting: Podiatry

## 2013-07-10 ENCOUNTER — Ambulatory Visit (INDEPENDENT_AMBULATORY_CARE_PROVIDER_SITE_OTHER): Payer: Medicare Other

## 2013-07-10 ENCOUNTER — Ambulatory Visit (INDEPENDENT_AMBULATORY_CARE_PROVIDER_SITE_OTHER): Payer: Medicare Other | Admitting: Podiatry

## 2013-07-10 VITALS — BP 180/72 | HR 69 | Resp 18

## 2013-07-10 DIAGNOSIS — M79606 Pain in leg, unspecified: Secondary | ICD-10-CM

## 2013-07-10 DIAGNOSIS — M79609 Pain in unspecified limb: Secondary | ICD-10-CM

## 2013-07-10 DIAGNOSIS — G589 Mononeuropathy, unspecified: Secondary | ICD-10-CM

## 2013-07-10 NOTE — Patient Instructions (Signed)
Examination today suggests possible nerve origin of your foot pain. I suggest you consult with Dr. Pete GlatterStoneking and further evaluate the possibility of diabetes.

## 2013-07-10 NOTE — Progress Notes (Signed)
° °  Subjective:    Patient ID: Yvonne Butler, female    DOB: 12/23/1933, 80 y.o.   MRN: [id number]  HPI My feet have been bothering me since I came out of the hospital in December of last year and they swell and numbness and tingling and they do get cold and there is a bruise on the left heel     Review of Systems  Respiratory: Positive for shortness of breath.   Cardiovascular:       Heart attack 17 years ago  Neurological: Positive for dizziness.  Hematological: Bruises/bleeds easily.  All other systems reviewed and are negative.      Objective:   Physical Exam Orientated x3 female  Vascular: DP and PT pulses 2/4 bilaterally Capillary fill is immediate bilaterally  Neurological: Sensation to 10 g monofilament wire intact 4/5 right and 5/5 left. Vibratory sensation intact bilaterally. Ankle reflexes reactive bilaterally.  Dermatological: No skin lesions noted. Pitting edema noted dorsal feet bilaterally. Surgical scar right medial lower leg. Ecchymosis medial left heel noted  Musculoskeletal: Patient has a shuffling unstable gait pattern.  Manual motor testing dorsi flexion, plantar flexion, inversion, eversion 5/5 bilaterally.  X-ray report left foot  Intact bony structures without fracture or dislocation noted. Tailor bunion noted. Posterior calcaneal spur noted  Radiographic impression: No acute bony abnormality noted left foot  X-ray report right foot  Intact bony structure without fracture or dislocation noted. Taylor's bunion noted. Posterior calcaneal spur noted  Radiographic impression: No acute bony abnormality noted right foot           Assessment & Plan:   Assessment: Gait disturbance bilaterally Possible neuropathic origin of patient's complaints  Plan: I recommended that patient consult with primary care physician for general physical examination and to rule out diabetes.  Possible neuropathy undetermined origin

## 2013-07-11 ENCOUNTER — Encounter: Payer: Self-pay | Admitting: Podiatry

## 2013-11-01 ENCOUNTER — Other Ambulatory Visit: Payer: Self-pay | Admitting: Geriatric Medicine

## 2013-11-01 DIAGNOSIS — R51 Headache: Secondary | ICD-10-CM

## 2013-11-05 ENCOUNTER — Other Ambulatory Visit: Payer: Medicare Other

## 2013-11-08 ENCOUNTER — Ambulatory Visit
Admission: RE | Admit: 2013-11-08 | Discharge: 2013-11-08 | Disposition: A | Discharge status: Home / Self Care | Payer: Medicare Other | Source: Ambulatory Visit | Attending: Geriatric Medicine | Admitting: Geriatric Medicine

## 2013-11-08 ENCOUNTER — Other Ambulatory Visit: Payer: Self-pay | Admitting: Geriatric Medicine

## 2013-11-08 DIAGNOSIS — R51 Headache: Secondary | ICD-10-CM

## 2013-12-20 ENCOUNTER — Encounter: Payer: Self-pay | Admitting: Podiatry

## 2013-12-20 ENCOUNTER — Ambulatory Visit (INDEPENDENT_AMBULATORY_CARE_PROVIDER_SITE_OTHER): Payer: Medicare Other | Admitting: Podiatry

## 2013-12-20 ENCOUNTER — Ambulatory Visit: Payer: Medicare Other | Admitting: Podiatry

## 2013-12-20 DIAGNOSIS — L608 Other nail disorders: Secondary | ICD-10-CM

## 2013-12-20 NOTE — Patient Instructions (Signed)
Apply Vaseline to great toenails daily and cover with a Band-Aid until comfortable

## 2013-12-21 ENCOUNTER — Encounter: Payer: Self-pay | Admitting: Interventional Cardiology

## 2013-12-21 NOTE — Progress Notes (Signed)
Patient ID: Yvonne Butler, female   DOB: [birth year], 80 y.o.   MRN: [id number]  Subjective: This patient presents today requesting debridement of toenails  Objective: The toenails have slight discolored distal aspects and are incurvated 6-10  Assessment: Toenails with minimal deformity with color changes  Plan: Debrided toenails x10 without a bleeding  Reappoint at patient's request

## 2014-01-05 ENCOUNTER — Encounter: Payer: Self-pay | Admitting: Interventional Cardiology

## 2014-01-05 ENCOUNTER — Ambulatory Visit (INDEPENDENT_AMBULATORY_CARE_PROVIDER_SITE_OTHER): Payer: Medicare Other | Admitting: Interventional Cardiology

## 2014-01-05 ENCOUNTER — Other Ambulatory Visit: Payer: Medicare Other

## 2014-01-05 VITALS — BP 186/78 | HR 70 | Ht 64.0 in | Wt 155.1 lb

## 2014-01-05 DIAGNOSIS — R413 Other amnesia: Secondary | ICD-10-CM

## 2014-01-05 DIAGNOSIS — I25810 Atherosclerosis of coronary artery bypass graft(s) without angina pectoris: Secondary | ICD-10-CM

## 2014-01-05 DIAGNOSIS — I10 Essential (primary) hypertension: Secondary | ICD-10-CM

## 2014-01-05 DIAGNOSIS — E785 Hyperlipidemia, unspecified: Secondary | ICD-10-CM

## 2014-01-05 LAB — LIPID PANEL
Cholesterol: 207 mg/dL — ABNORMAL HIGH (ref 0–200)
HDL: 74 mg/dL (ref >39)
LDL CALC: 98 mg/dL (ref 0–99)
TRIGLYCERIDES: 176 mg/dL — AB (ref <150)
Total CHOL/HDL Ratio: 2.8 Ratio
VLDL: 35 mg/dL (ref 0–40)

## 2014-01-05 LAB — ALT: ALT: 19 U/L (ref 0–35)

## 2014-01-05 NOTE — Patient Instructions (Addendum)
Your physician recommends that you continue on your current medications as directed. Please refer to the Current Medication list given to you today.  Lab Today: Lipid, Alt  Call the office if your are having any shortness of breath, chest pain, or swelling.  Your physician wants you to follow-up in: 1 year with Dr.Kommer You will receive a reminder letter in the mail two months in advance. If you don't receive a letter, please call our office to schedule the follow-up appointment.

## 2014-01-05 NOTE — Progress Notes (Signed)
Patient ID: Yvonne Butler, female   DOB: [birth year], 80 y.o.   MRN: [id number]    [address] [place], [place]  [id number] Phone: [phone] Fax:  [phone]  Date:  01/05/2014   ID:  Yvonne Butler, DOB [birth year], MRN [id number]  PCP:  Ginette OttoSTONEKING,HAL THOMAS, MD   ASSESSMENT:  1. Coronary artery disease with prior coronary bypass surgery, asymptomatic 2. Hypertension, controlled 3. Hyperlipidemia, currently on therapy 4. Memory loss, progressive  PLAN:  1. I encouraged aerobic exercise 2. I recommended that they call if angina or dyspnea. 3. No change in current medical regimen   SUBJECTIVE: Yvonne Butler is a 80 y.o. female who is doing relatively well but states that her quality of life is deteriorating do to memory loss. She has no cardiopulmonary complaints. She denies lower extremity edema. No palpitations or angina. No medication side effects.   Wt Readings from Last 3 Encounters:  01/05/14 155 lb 1.9 oz (70.362 kg)  02/19/13 159 lb (72.122 kg)  02/19/13 159 lb (72.122 kg)     Past Medical History  Diagnosis Date  . CAD (coronary artery disease)     with CABG- Dr. [name]  . Hypercholesteremia   . Coronary atherosclerosis of native coronary artery   . Vitamin D deficiency   . Hyperlipidemia   . Osteopenia   . Diverticulosis   . Diabetes   . Complication of anesthesia   . PONV (postoperative nausea and vomiting)     Current Outpatient Prescriptions  Medication Sig Dispense Refill  . amLODipine (NORVASC) 5 MG tablet Take 5 mg by mouth daily.       [name]. aspirin EC 325 MG tablet Take 1 tablet (325 mg total) by mouth 2 (two) times daily after a meal.  60 tablet  0  . donepezil (ARICEPT) 5 MG tablet Take 5 mg by mouth at bedtime.      [name]. glimepiride (AMARYL) 1 MG tablet Take 1 mg by mouth daily with breakfast.       . metoprolol tartrate (LOPRESSOR) 25 MG tablet Take 25 mg by mouth 2 (two) times daily.      . pravastatin (PRAVACHOL) 40 MG tablet        . simvastatin (ZOCOR) 20 MG tablet Take 20 mg by mouth daily.      . traMADol (ULTRAM) 50 MG tablet        No current facility-administered medications for this visit.    Allergies:   No Known Allergies  Social History:  The patient  reports that she has quit smoking. Her smoking use included Cigarettes. She smoked 0.00 packs per day. She has never used smokeless tobacco. She reports that she does not drink alcohol or use illicit drugs.   ROS:  Please see the history of present illness.   No edema. No transient neurological symptoms. Memory loss is a big issue.   All other systems reviewed and negative.   OBJECTIVE: VS:  BP 186/78  Pulse 70  Ht 5\' 4"  (1.626 m)  Wt 155 lb 1.9 oz (70.362 kg)  BMI 26.61 kg/m2 Well nourished, well developed, in no acute distress, appears than stated age HEENT: normal Neck: JVD flat. Carotid bruit faint right carotid bruit  Cardiac:  normal S1, S2; RRR; no murmur Lungs:  clear to auscultation bilaterally, no wheezing, rhonchi or rales Abd: soft, nontender, no hepatomegaly Ext: Edema absent. Pulses 2+ bilateral upper and lower extremities Skin: warm and dry Neuro:  CNs 2-12  intact, no focal abnormalities noted  EKG:  Normal sinus rhythm with prominent voltage and nonspecific ST-T wave abnormality. First AV block       Signed, Darci NeedleHenry W. B. Toure III, MD 01/05/2014 4:44 PM

## 2014-01-12 ENCOUNTER — Other Ambulatory Visit: Payer: Medicare Other

## 2014-01-12 ENCOUNTER — Ambulatory Visit: Payer: Medicare Other | Admitting: Interventional Cardiology

## 2014-01-19 ENCOUNTER — Telehealth: Payer: Self-pay

## 2014-01-19 NOTE — Telephone Encounter (Signed)
-----   Message from Lyn RecordsHenry W Trumpower III, MD sent at 01/15/2014  8:30 AM EST ----- Lipid levels are adequate. No change in therapy.

## 2014-01-19 NOTE — Telephone Encounter (Signed)
Pt adv lab results.Lipid levels are adequate. No change in therapy.pt verbalized understanding.

## 2014-03-29 NOTE — Consults (Addendum)
[facility]  CONSULTATION REPORT  NAME:  [name] [name]  SEX:   F  ADMIT:   DATE OF CONSULT: 03/29/2014  REFERRING PHYSICIAN:    DOB: Oct 11, 1933  MR#    [id number]  ROOM:    ACCT#  [id number]    cc: [name] SkeneMichael Romash MD    ATTENDING PHYSICIAN:  Dr. Jones SkeneMichael [name]    DATE OF SURGERY:  04/03/2014    SURGERY:  Left knee replacement    SUMMARY OF HISTORY:  This 81 year old female, who is hypertensive and has hemochromatosis and  rheumatoid arthritis and osteoarthritis is scheduled for elective knee  replacement.  She has no history of coronary heart disease.  She is not short  of breath.  She does not have chest pain, but does have a small amount of  edema. She is not diabetic.  She is scheduled for elective knee replacement.  Medical consult was requested for preoperative and perioperative management.    PAST HISTORY:  1.  Hemochromatosis for which she is phlebotomized.   2.  Hypertension.  3.  Rheumatoid arthritis.  4.  Osteoarthritis  5.  Periodic phlebotomy.    6.  Immunocompromised state because of treatment with Orencia and prednisone,  which she has stopped.    REVIEW OF SYSTEMS:  CONSTITUTIONAL:  Elevated weight but stable.  PULMONARY:  No wheezing.      PHYSICAL EXAMINATION:   VITAL SIGNS: Blood pressure today is 140/80, pulse 70, respiratory rate 17,  temperature 98, weight 154, no distress.  NECK:  Symmetric and supple.  Thyroid not enlarged.  LUNGS:  Clear A&P.  CARDIAC:  PMI nondisplaced without murmur or gallop.  BREASTS: Symmetric without mass or discharge.  ABDOMEN:  Protuberant, nontender without megaly.  EXTREMITIES:  No clubbing, cyanosis or edema.  There are deformities of the  hands from degenerative and rheumatoid arthritis.  SKIN:  Warm and dry without rash or nodules.    VASCULAR:  Pulses are present in the feet.  NEUROLOGIC:  Moves all, symmetric reflexes, normal sensation.    SUMMARY OF LABORATORIES:  Urinalysis shows no blood, no leukocytes.  EKG shows sinus rhythm, premature   atrial contractions, and poor R-wave progression, probably of normal variant .  Chest x-ray, CBC, coagulation studies and CMP are pending.      DIAGNOSTIC IMPRESSIONS:  1.  Degenerative joint disease.  2.  Hypertension.  3.  Hemochromatosis.  4.  Rheumatoid arthritis.  5.  Immunocompromised state.    PLAN:  The patient is cleared as a class 3 anesthetic risk.  She has been on  immunocompromising agents, but will have stopped them for a month prior to the  surgery, specifically the Orencia.  She has also stopped her prednisone.  These agents should remain stopped for at least a month after her surgery is  completed.  Her blood work is pending, and pending my receipt of that she is  cleared as a class 3 anesthetic risk assuming all blood work is within normal  limits.  I will follow her perioperatively with you as necessary.    Thank you very much for the consultation.  Note that she should be  anticoagulated postop, either Xarelto, Lovenox or Coumadin; I would prefer  Lovenox.  I will follow her with you perioperatively.      ___________________  [name] RacerLouis Aaliyana Fredericks MD  Dictated By:.   Edmonia CaprioJM  D:03/29/2014 18:25:59  T: 03/29/2014 18:56:19  [id number]  Electronically Authenticated and Edited by:  [name] RacerLouis Marializ [name], [name] On 04/01/2014  11:49 AM EST

## 2014-04-02 LAB — ANTIBODY SCREEN: Antibody screen: NEGATIVE

## 2014-04-02 LAB — BLOOD TYPE, (ABO+RH)
ABO/Rh(D): O POS
ABO/Rh: O POS

## 2014-04-03 LAB — GLUCOSE, POC: Glucose (POC): 209 mg/dL — ABNORMAL HIGH (ref 65–105)

## 2014-04-03 LAB — MRSA SCREEN - PCR (NASAL): MRSA PCR SCREEN: NEGATIVE

## 2014-04-03 NOTE — Op Note (Signed)
[facility]  Inpatient Operation Report  NAME:  [name] [name], [name] [name]  SEX:   F  DATE: 04/03/2014  DOB: [birth year]  MR#    [id number]  ROOM:  [id number]  ACCT#  [id number]    cc: Rosebud Poles MD, Gracy Racer MD    INDICATIONS FOR SURGERY:   Varus left knee with severe tricompartmental arthritis from rheumatoid  arthritis that interferes with activities of daily living.    OPERATIVE PROCEDURE:  Left total knee arthroplasty using a modified quadriceps-sparing approach and  instrumentation.     PROSTHESIS:    Zimmer NexGen gender specific:    Uncemented Technique Femur:  Size E, posterior cruciate retaining.    Uncemented Technique Tibia:  Trabecular Metal Mono Block tibia, 10 mm, size 4  posterior cruciate retaining.    Uncemented Technique Patella:  38 mm x 10 mm thick Trabecular Metal    SURGEON:  Jones Skene, [name]    ASSISTANTNilda Simmer    DESCRIPTION OF OPERATION:    After anesthesia was induced, the patient's  left leg was sterilely prepared  and draped in the usual fashion.      A medial parapatellar incision was made from just below the tibial tubercle to  just above the superior pole of the patella.  The medial retinaculum and  extensor mechanism were exposed.      An oblique incision was made along the lines of the vastus medialis obliquus  to the superior corner of the patella.  A medial parapatellar incision was  then made down to and just past the tibial tubercle.      The retropatellar fat pad was excised.      The medial soft tissues were peeled backwards from the tibial tubercle.  A  medial meniscectomy was performed.      The knee was then extended.  The patella was everted 90 degrees and measured.    The measurement of the patellar thickness was 24 to 25 millimeters.    Then, 5 millimeters of the patella were removed.  This flattened the dome and  allowed [place] to sublux the patella laterally.      The soft tissue on the anterior aspect of the femur was removed exposing the  anterior cortex just above  the trochlea necessary to size the femoral   component.    With the knee extended, the anterior portion of the lateral tibial plateau was  exposed and the lateral meniscus freed, allowing retraction away from the  bone.  A 1/2 inch curved osteotome was used to release the capsule from the  lateral tibia.    The knee was then flexed.  The anterior cruciate ligament was removed.      The distal femoral cut was then made.    A pilot hole was made 1 centimeter anterior to the insertion of the posterior  cruciate ligament on the femur and in the midline.  The alignment guide was  placed.  This was  6 degrees of valgus arrived at from preoperative  templating.  This was impacted until the medial paddle was flush on the medial  femoral condyle.  The cutting block was then affixed to the side of the medial  femoral condyle.     The external alignment rod was fixed to the guide and an x-ray was made  confirming that the rod pointed to the center of the femoral head.     The 2-pronged patellar protective retractor was then placed about the lateral  femoral condyle and the distal femoral cut was made.      The cutting jigs were removed.    The external tibial cutting block was then applied.  This was centered over  the midportion of the tibia and this was aligned with the mid 1/3 of the  patellar tendon.    It was also centered in the center of the ankle and checked radiographically.    The depth of the tibial cut was set at 6 millimeters below the depressed  medial tibial plateau, arrived from preoperative templating.      When appropriate alignment of the jig was accomplished and checked  fluoroscopically, the cutting block was pinned to the tibia on its medial  aspect.  Then, using the quadriceps-sparing technique and instruments, a cut  was made from medial to lateral across the tibia and the posterior cruciate  ligament insertion was spared.  This tibial surface was then divided using a  dagger saw and the medial and  lateral plateau removed.  Again, the posterior  cruciate remained intact.    The knee was extended.  A 10 mm spacer was placed.  The 10 millimeter spacer  provided appropriate ligamentous balancing and alignment.    The tibial cutting guide was then removed.    The knee was then flexed.  The femoral sizer was then placed underneath the  medial femoral condyles.  The upright was aligned with Whiteside's Line which  had previously been drawn and the stylus placed on the anterior tibia.  The  femur  measured to a size E femur.    The anterior cutting block was then applied.  It was pinned.  A small drill  was used to check the alignment with the anterior cortex, which was quite  satisfactory.  Then, the preliminary anterior cut was made.    The size E femoral cutting block was then applied and supported on the  preliminary anterior cut which gave us appropriate external rotation.  This  was centered, pinned in place and the drill holes made for the later femoral  pegs and the posterior condylar cuts.  The definitive anterior cut, the  chamfer cuts and the cut for the patellar groove were made.      The size of the femur dictated that a gender-specific knee would be used.      With the knee flexed, a 12 millimeter spacer was placed and the flexion space  was rectangular and quite adequate.    The knee was then brought into flexion with the tibia subluxed anteriorly and  the tibial tray was applied.  This was a size 4 trial tibial tray which  provided appropriate coverage.  This was aligned to the midportion and ankle  and pinned in place.  The 10 millimeter tibial plastic was placed on this tray  and the trial femur fixed.  This provided satisfactory motion and stability.  The alignment was checked radiographically with a long alignment rod placed  through the prosthetic alignment guide and this was seen to be centered on the  center of the femoral head through the center of the knee to the center of the  ankle showing  appropriate alignment of the limb and fit of the prosthetic  elements.      The final preparation of the tibia was then accomplished drilling the holes  through the tibial drill guide.    The joint was distracted with lamina spreaders and hemostasis was accomplished  with the plasmatron  device.    The wound was then lavaged.  The Trabecular Metal size 4 mono block tibia  which was 12 mm thick was placed.      Further irrigation was then performed.  The size E gender specific femoral  component was placed and impacted.  These were posterior cruciate retaining.      Final preparation of the patella was then done bringing the remaining patellar  thickness to 15 millimeters.  It was sized and a 38 millimeter in diameter 10  millimeter thick Trabecular Metal-backed patella was applied.     The tourniquet was let down.  The knee was stable and went to full extension  and flexed to 130 degrees.  The patella tracked quite normally.     X-rays were made in the operating room demonstrating appropriate alignment of  the limb and fit of the components.    Again, the wound was irrigated and hemostasis secured.  Drains were placed.    The incisions in the vastus medialis obliquus synovium and medial retinaculum  were then closed with #1 looped PDS.      The wounds were then closed in layers, sterile dressings applied and the limb  supported with calf compression pumps and a Jones dressing.      She tolerated this procedure and left the operating room in satisfactory  condition.      ___________________  Jones Skene MD  Dictated By:.   MLT  D:04/03/2014 15:22:39  T: 04/03/2014 15:50:18  [id number]  Electronically Authenticated by:  Elmer Sow. Sherlon Handing, [name] On 04/04/2014 09:15 AM EST

## 2014-04-03 NOTE — Op Note (Addendum)
[facility]  [facility] Operation [facility]  NAME:  [name] [name], [name]  SEX:   F  DATE: 04/03/2014  DOB: 01-31-1934  MR#    [id number]  ROOM:  [id number]  ACCT#  [id number]    cc: [name] PolesBrinda Dixit MD, [name] RacerLouis Carideo MD    INDICATIONS FOR SURGERY:   Varus left knee with severe tricompartmental arthritis from rheumatoid  arthritis that interferes with activities of daily living.    OPERATIVE PROCEDURE:  Left total knee arthroplasty using a modified quadriceps-sparing approach and  instrumentation.     PROSTHESIS:    Zimmer NexGen gender specific:    Uncemented Technique Femur:  Size E, posterior cruciate retaining.    Uncemented Technique Tibia:  Trabecular Metal Mono Block tibia, 10 mm, size 4  posterior cruciate retaining.    Uncemented Technique Patella:  38 mm x 10 mm thick Trabecular Metal    SURGEON:  [name] [name], [name]    ASSISTANNilda [name]:  [name] [name]    DESCRIPTION OF OPERATION:    After anesthesia was induced, the patient's  left leg was sterilely prepared  and draped in the usual fashion.      A medial parapatellar incision was made from just below the tibial tubercle to  just above the superior pole of the patella.  The medial retinaculum and  extensor mechanism were exposed.      An oblique incision was made along the lines of the vastus medialis obliquus  to the superior corner of the patella.  A medial parapatellar incision was  then made down to and just past the tibial tubercle.      The retropatellar fat pad was excised.      The medial soft tissues were peeled backwards from the tibial tubercle.  A  medial meniscectomy was performed.      The knee was then extended.  The patella was everted 90 degrees and measured.    The measurement of the patellar thickness was 24 to 25 millimeters.    Then, 5 millimeters of the patella were removed.  This flattened the dome and  allowed us to sublux the patella laterally.      The soft tissue on the anterior aspect of the femur was removed exposing the   anterior cortex just above the trochlea necessary to size the femoral   component.    With the knee extended, the anterior portion of the lateral tibial plateau was  exposed and the lateral meniscus freed, allowing retraction away from the  bone.  A 1/2 inch curved osteotome was used to release the capsule from the  lateral tibia.    The knee was then flexed.  The anterior cruciate ligament was removed.      The distal femoral cut was then made.    A pilot hole was made 1 centimeter anterior to the insertion of the posterior  cruciate ligament on the femur and in the midline.  The alignment guide was  placed.  This was  6 degrees of valgus arrived at from preoperative  templating.  This was impacted until the medial paddle was flush on the medial  femoral condyle.  The cutting block was then affixed to the side of the medial  femoral condyle.     The external alignment rod was fixed to the guide and an x-ray was made  confirming that the rod pointed to the center of the femoral head.     The 2-pronged patellar protective retractor was then placed about the lateral  femoral condyle and the distal femoral cut was made.      The cutting jigs were removed.    The external tibial cutting block was then applied.  This was centered over  the midportion of the tibia and this was aligned with the mid 1/3 of the  patellar tendon.    It was also centered in the center of the ankle and checked radiographically.    The depth of the tibial cut was set at 6 millimeters below the depressed  medial tibial plateau, arrived from preoperative templating.      When appropriate alignment of the jig was accomplished and checked  fluoroscopically, the cutting block was pinned to the tibia on its medial  aspect.  Then, using the quadriceps-sparing technique and instruments, a cut  was made from medial to lateral across the tibia and the posterior cruciate  ligament insertion was spared.  This tibial surface was then divided using a   dagger saw and the medial and lateral plateau removed.  Again, the posterior  cruciate remained intact.    The knee was extended.  A 10 mm spacer was placed.  The 10 millimeter spacer  provided appropriate ligamentous balancing and alignment.    The tibial cutting guide was then removed.    The knee was then flexed.  The femoral sizer was then placed underneath the  medial femoral condyles.  The upright was aligned with Whiteside's Line which  had previously been drawn and the stylus placed on the anterior tibia.  The  femur  measured to a size E femur.    The anterior cutting block was then applied.  It was pinned.  A small drill  was used to check the alignment with the anterior cortex, which was quite  satisfactory.  Then, the preliminary anterior cut was made.    The size E femoral cutting block was then applied and supported on the  preliminary anterior cut which gave [place] appropriate external rotation.  This  was centered, pinned in place and the drill holes made for the later femoral  pegs and the posterior condylar cuts.  The definitive anterior cut, the  chamfer cuts and the cut for the patellar groove were made.      The size of the femur dictated that a gender-specific knee would be used.      With the knee flexed, a 12 millimeter spacer was placed and the flexion space  was rectangular and quite adequate.    The knee was then brought into flexion with the tibia subluxed anteriorly and  the tibial tray was applied.  This was a size 4 trial tibial tray which  provided appropriate coverage.  This was aligned to the midportion and ankle  and pinned in place.  The 10 millimeter tibial plastic was placed on this tray  and the trial femur fixed.  This provided satisfactory motion and stability.  The alignment was checked radiographically with a long alignment rod placed  through the prosthetic alignment guide and this was seen to be centered on the   center of the femoral head through the center of the knee to the center of the  ankle showing appropriate alignment of the limb and fit of the prosthetic  elements.      The final preparation of the tibia was then accomplished drilling the holes  through the tibial drill guide.    The joint was distracted with lamina spreaders and hemostasis was accomplished  with the plasmatron  device.    The wound was then lavaged.  The Trabecular Metal size 4 mono block tibia  which was 12 mm thick was placed.      Further irrigation was then performed.  The size E gender specific femoral  component was placed and impacted.  These were posterior cruciate retaining.      Final preparation of the patella was then done bringing the remaining patellar  thickness to 15 millimeters.  It was sized and a 38 millimeter in diameter 10  millimeter thick Trabecular Metal-backed patella was applied.     The tourniquet was let down.  The knee was stable and went to full extension  and flexed to 130 degrees.  The patella tracked quite normally.     X-rays were made in the operating room demonstrating appropriate alignment of  the limb and fit of the components.    Again, the wound was irrigated and hemostasis secured.  Drains were placed.    The incisions in the vastus medialis obliquus synovium and medial retinaculum  were then closed with #1 looped PDS.      The wounds were then closed in layers, sterile dressings applied and the limb  supported with calf compression pumps and a [name] dressing.      She tolerated this procedure and left the operating room in satisfactory  condition.      ___________________  [name] Skene MD  Dictated By:.   MLT  D:04/03/2014 15:22:39  T: 04/03/2014 15:50:18  [id number]  Electronically Authenticated by:  Elmer Sow. Sherlon Handing, [name] On 04/04/2014 09:15 AM EST

## 2014-04-04 LAB — HGB & HCT
HCT: 31 % — ABNORMAL LOW (ref 37.0–50.0)
HGB: 10.7 gm/dl — ABNORMAL LOW (ref 13.0–17.2)

## 2014-04-05 LAB — CREATININE
Creatinine: 0.6 mg/dl (ref 0.6–1.3)
GFR est AA: >60
GFR est non-AA: >60

## 2014-04-05 LAB — HGB & HCT
HCT: 29 % — ABNORMAL LOW (ref 37.0–50.0)
HGB: 10.1 gm/dl — ABNORMAL LOW (ref 13.0–17.2)

## 2014-04-06 LAB — HGB & HCT
HCT: 27.6 % — ABNORMAL LOW (ref 37.0–50.0)
HGB: 9.5 gm/dl — ABNORMAL LOW (ref 13.0–17.2)

## 2014-04-16 ENCOUNTER — Other Ambulatory Visit: Payer: Self-pay | Admitting: Geriatric Medicine

## 2014-04-16 ENCOUNTER — Ambulatory Visit
Admission: RE | Admit: 2014-04-16 | Discharge: 2014-04-16 | Disposition: A | Discharge status: Home / Self Care | Payer: Medicare Other | Source: Ambulatory Visit | Attending: Geriatric Medicine | Admitting: Geriatric Medicine

## 2014-04-16 DIAGNOSIS — E041 Nontoxic single thyroid nodule: Secondary | ICD-10-CM

## 2014-04-18 ENCOUNTER — Other Ambulatory Visit: Payer: Self-pay | Admitting: Geriatric Medicine

## 2014-04-18 DIAGNOSIS — E041 Nontoxic single thyroid nodule: Secondary | ICD-10-CM

## 2014-04-25 ENCOUNTER — Other Ambulatory Visit (HOSPITAL_COMMUNITY)
Admission: RE | Admit: 2014-04-25 | Discharge: 2014-04-25 | Disposition: A | Discharge status: Home / Self Care | Payer: Medicare Other | Source: Ambulatory Visit | Attending: Interventional Radiology | Admitting: Interventional Radiology

## 2014-04-25 ENCOUNTER — Ambulatory Visit
Admission: RE | Admit: 2014-04-25 | Discharge: 2014-04-25 | Disposition: A | Discharge status: Home / Self Care | Payer: Medicare Other | Source: Ambulatory Visit | Attending: Geriatric Medicine | Admitting: Geriatric Medicine

## 2014-04-25 DIAGNOSIS — E041 Nontoxic single thyroid nodule: Secondary | ICD-10-CM | POA: Insufficient documentation

## 2014-07-19 ENCOUNTER — Other Ambulatory Visit (INDEPENDENT_AMBULATORY_CARE_PROVIDER_SITE_OTHER): Payer: Medicare Other | Admitting: *Deleted

## 2014-07-19 DIAGNOSIS — I25810 Atherosclerosis of coronary artery bypass graft(s) without angina pectoris: Secondary | ICD-10-CM | POA: Diagnosis not present

## 2014-07-19 DIAGNOSIS — I10 Essential (primary) hypertension: Secondary | ICD-10-CM | POA: Diagnosis not present

## 2014-07-19 LAB — HEPATIC FUNCTION PANEL
ALBUMIN: 3.7 g/dL (ref 3.5–5.2)
ALK PHOS: 55 U/L (ref 39–117)
ALT: 14 U/L (ref 0–35)
AST: 18 U/L (ref 0–37)
Bilirubin, Direct: 0.1 mg/dL (ref 0.0–0.3)
TOTAL PROTEIN: 6.9 g/dL (ref 6.0–8.3)
Total Bilirubin: 0.5 mg/dL (ref 0.2–1.2)

## 2014-07-19 LAB — LIPID PANEL
CHOL/HDL RATIO: 3
Cholesterol: 216 mg/dL — ABNORMAL HIGH (ref 0–200)
HDL: 74.4 mg/dL (ref >39.00)
LDL CALC: 121 mg/dL — AB (ref 0–99)
NONHDL: 141.6
TRIGLYCERIDES: 105 mg/dL (ref 0.0–149.0)
VLDL: 21 mg/dL (ref 0.0–40.0)

## 2014-07-19 NOTE — Addendum Note (Signed)
Addended by: Noel Rodier K on: 07/19/2014 08:28 AM   Modules accepted: Orders  

## 2014-07-19 NOTE — Addendum Note (Signed)
Addended by: Tonita PhoenixBOWDEN, Jamela Cumbo K on: 07/19/2014 08:28 AM   Modules accepted: Orders

## 2014-07-20 ENCOUNTER — Telehealth: Payer: Self-pay

## 2014-07-20 ENCOUNTER — Inpatient Hospital Stay
Admit: 2014-07-20 | Discharge: 2014-07-20 | Disposition: A | Discharge status: Home / Self Care | Payer: MEDICARE | Attending: Emergency Medical Services

## 2014-07-20 DIAGNOSIS — E785 Hyperlipidemia, unspecified: Secondary | ICD-10-CM

## 2014-07-20 DIAGNOSIS — S0990XD Unspecified injury of head, subsequent encounter: Secondary | ICD-10-CM

## 2014-07-20 MED ORDER — HYDROCODONE-ACETAMINOPHEN 5 MG-325 MG TAB
5-325 mg | ORAL_TABLET | ORAL | Status: DC | PRN
Start: 2014-07-20 — End: 2017-08-31

## 2014-07-20 NOTE — Telephone Encounter (Signed)
-----   Message from Lyn RecordsHenry W Maready, MD sent at 07/20/2014 11:18 AM EDT ----- The liver is functioning normally on current therapy. The lipid levels are less than ideal. The medication list has two statins contained on it. Please find out which one she is using. I will likely need to make an upward adjustment.

## 2014-07-20 NOTE — Telephone Encounter (Signed)
Pt aware of lab results. The liver is functioning normally on current therapy. The lipid levels are less than ideal. The medication list has two statins contained on it. Please find out which one she is using. I will likely need to make an upward adjustment Pt sts that she is currently taking Pravastatin 40mg  qd. Simvastatin has been removed from her medlist. Adv pt I will fwd that info to Dr.Klees and callback with his instructions. Pt verbalized understanding.

## 2014-07-20 NOTE — ED Provider Notes (Signed)
[facility]  EMERGENCY [facility]MENT TREATMENT REPORT  NAME:  [name] [name], [name] [name]  SEX:   F  ADMIT: 07/20/2014  DOB:   October 22, 1933  MR#    [id number]  ROOM:  [id number]  TIME DICTATED: 03 40 PM  ACCT#  [id number]    cc: Gracy RacerLouis Carideo MD    PRIMARY CARE PHYSICIAN:   Gracy RacerLouis Carideo, MD    HISTORY OF PRESENT ILLNESS:  This is a 81 year old female who had a fall on her front steps last night at   2300.  She fell and struck her right elbow and struck her face.  She did not   suffer any loss of consciousness.  She went to sleep and today woke up and was   seen at [facility] for continued elbow pain and injuries to her face.  She   had x-rays taken at [facility] of her right arm which showed a fracture of   the right elbow.  She was placed in an Orthoglass long arm splint.  She was   sent to the Emergency Department for further evaluation for possible   consideration for facial fractures and intracranial injury.  The patient   arrives awake, alert and oriented, ambulating without difficulty, acting   appropriately.  She has family with her who state she is acting like her   normal self.  She denies any facial pain other than at the areas of abrasions   on her chin and bridge of her nose.  She was provided with some Tylenol for   pain control.  She does not have any significant pain.  She declined any pain   medications at this time.  She did bring her x-rays with her from [facility], which we will review here.      REVIEW OF SYMPTOMS:    CONSTITUTIONAL:  No fever, chills or weight loss.  EYES:   No visual symptoms.  ENT:  No sore throat, runny nose or other URI symptoms.  She does have one   loose tooth on the upper left portion of her dentition.  She says she just   pushed it back up into the gingiva and it stayed in place since last night and   does not hurt.  RESPIRATORY:  No cough, shortness of breath or wheezing.  CARDIOVASCULAR:  No chest pain, chest pressure or palpitations.   GASTROINTESTINAL:  No vomiting, diarrhea or abdominal pain.  GENITOURINARY:  No dysuria, frequency or urgency.  MUSCULOSKELETAL:  Right elbow pain with known fracture per [facility]   x-rays.  The patient again is already splinted.  INTEGUMENTARY:  No rashes.  NEUROLOGICAL:  No headaches, sensory or motor symptoms.    PAST MEDICAL HISTORY:  Thyroid dysfunction, hypertension, hemochromatosis, arthritis.  She had a left   knee replacement, currently under the care of Dr. Sherlon Handingomash for that.    SOCIAL HISTORY:  Nonsmoker, no drugs, no alcohol use.      MEDICATIONS:  Synthroid, lisinopril, prednisone.    ALLERGIES:  NO KNOWN DRUG ALLERGIES.    PHYSICAL EXAMINATION:  VITAL SIGNS:  Temperature 98.5, pulse 87, respiration 16, blood pressure   146/96, saturation 95% on room air.  GENERAL APPEARANCE:  Very pleasant, nontoxic appearing woman sitting on side   of the bed when I entered the room, awake, alert and oriented.  HEENT:  Eyes:  Conjunctivae clear.  Pupils PERRL.  ENT:  Essentially   unremarkable.  There is, of note, left upper incisor which appears  to be   slightly loose.  There is no bleeding.  There are no other intraoral   abnormalities or injuries.  Nasal mucosa is unremarkable.  There is no   displacement of the nose.  She is having no difficulty breathing through   either naris.  There is no active bleeding.  NECK:  Supple and nontender.  She has no midline tenderness.  No crepitus, no   step-off.  She turns her head and neck without difficulty.  Flexes and extends   without difficulty.  There are absolutely no concerning findings on the neck   exam.  RESPIRATORY:  Lungs are clear throughout.  No wheezing, rhonchi or rales.  CARDIOVASCULAR:  Heart regular.  ABDOMEN:  Soft and nontender, pain free to palpation.  MUSCULOSKELETAL:  Right arm is noted to be in an Ortho-Glass splint flexed at   90 degrees at the elbow.  She has minimal discomfort.  She has good distal    pulses.  She has good sensation.  She has appropriate capillary refill on the   affected arm.  She has no shoulder pain in the affected arm.  She has no wrist   pain in the affected arm.  On closer inspection of the face, there are   numerous minor abrasions to the bridge of nose and to her chin.  She has   absolutely no reproducible pain to the orbital bones or jaw.  She has no   trismus.  She has no pain with talking or opening and closing or biting down.    There is no indication of injury to the skull.  She has no pain to palpation.    She has no pain to the mastoid prominences bilaterally.  She has no pain in   the occipital portion of the skull.  She has minor tenderness in the immediate   area of the abrasion on the bridge of her nose and on the chin, but other   than that she has no significant pain.  My suspicion for any significant   facial bone fractures is extremely low.  SKIN:  Warm and dry.  There are noted abrasions but otherwise unremarkable.  NEUROLOGIC:  She is alert.  She is oriented.  Sensation intact.  Motor   strength equal and symmetric, 5 out of 5.  There is no facial asymmetry.    There are no focal neurologic deficits at this time.  Again, she states, as   does her family states she has been acting completely normal.  Her injury   happened at 2300 last night.  My suspicion for any intracranial injury or   skull fracture at this time is extremely low as well.    INITIAL ASSESSMENT:  This is a well appearing 81 year old64 who had a fall at 2300 last night, found   to have right elbow fracture when she was seen at [facility] today and was   evaluated and was sent to the Emergency Department for further evaluation of   the facial injuries by the provider at [facility].  At this time, we do not   feel that she needs emergency imaging, i.e., CT scan of her head and facial   bones or neck given her evaluation and given her neurologic exam.  Again, we    reviewed the x-rays that came with the patient from [facility].  She is   already under the care of Dr. Salomon Fickomash's orthopedic group.  She is going to  follow up with them on Monday and she is already in a splint and we evaluated   the splint and we are happy with the splint that has been applied.  We are   going to discharge the patient home.  She has pain medication from her prior   knee surgery.  We have written a prescription for short course of Norco for   additional pain control should she need it.  She is to return to the Emergency   Department for any concerns or worsening symptoms and again, otherwise follow   up with her orthopedic physician.      FINAL DIAGNOSES:    1.  Minor closed head injury.    2.  Supracondylar fracture of the right humerus, closed.  3.  Fracture of proximal end, right radius.  4.  Facial abrasions.    DISPOSITION AND PLAN:  The case was discussed Dr. Buddy Duty who saw the patient as well and who agrees   with the above assessment and plan.  The patient is discharged in stable   condition.    CONTINUED BY Jannetta Quint, MD:  I interviewed and examined the patient. I discussed with the advanced practice   clinician and agree with their evaluation and plan as documented here.    I seen and evaluated this 81 year old female status post fall last evening    around 11:00. I  reviewed her x-ray images from [facility] and she looks   like she has a proximal head fracture as well as supracondylar fracture. The   patient is already splinted. She is sent over for evaluation because she did   hit the front of her face. I do not see any signs of septal hematoma. She is   awake and alert. She had no loss of consciousness. She is mentating well. She   is not vomiting and she is now over 18  hours out  from the injury. For this   reason we will continue observation. She is not any anticoagulants and with   trauma all occurring straight to the face versus other high risk regions of    the head. She is comfortable with the plan of care of monitoring and therefore   we have deferred CT imaging but the patient will be monitored for developing   of any vomiting, change in mental status, difficulty concentrating. Husband is   at bedside for this discussion and she is planning to follow with Dr.   Salomon Fick group as she has recently had another surgery by him.      ___________________  Lucio Edward MD  Dictated By: Janett Billow, PA-C    My signature above authenticates this document and my orders, the final   diagnosis (es), discharge prescription (s), and instructions in the Epic   record.  If you have any questions please contact [phone].    Nursing notes have been reviewed by the physician/ advanced practice   clinician.    MLT  D:07/20/2014 15:40:35  T: 07/20/2014 19:43:23  [id number]

## 2014-07-20 NOTE — ED Notes (Signed)
fal facial bruising right elbow pain

## 2014-07-24 MED ORDER — ATORVASTATIN CALCIUM 40 MG PO TABS: 40.0000 mg | ORAL_TABLET | Freq: Every day | ORAL | Status: DC

## 2014-07-24 NOTE — Telephone Encounter (Signed)
-----   Message from Lyn RecordsHenry W Lazare, MD sent at 07/23/2014  7:47 AM EDT ----- Needs atorvastatin 40 mg daily. Lipid and liver in 6 weeks. ----- Message -----    From: Jarvis NewcomerLisa S Parris-Godley, CMA    Sent: 07/20/2014   3:04 PM      To: Lyn RecordsHenry W Upperman, MD  Pt aware of lab results. The liver is functioning normally on current therapy. The lipid levels are less than ideal. The medication list has two statins contained on it. Please find out which one she is using. I will likely need to make an upward adjustment Pt sts that she is currently taking Pravastatin 40mg  qd. Simvastatin has been removed from her medlist. Adv pt I will fwd that info to Dr.Schmoll and callback with his instructions. Pt verbalized understanding.

## 2014-07-24 NOTE — Telephone Encounter (Signed)
Pt husband aware of Dr.Ericsson's recommendations STOP Pravastatin START Atorvastatin 40mg  qd. An Rx has been sent to pt pharmacy. Lap app sch for fasting lipid and alt on 6/20 Pt husband verbalized understanding.

## 2014-09-03 ENCOUNTER — Other Ambulatory Visit: Payer: Medicare Other

## 2015-01-08 ENCOUNTER — Ambulatory Visit: Payer: Medicare Other | Admitting: Interventional Cardiology

## 2015-01-15 ENCOUNTER — Other Ambulatory Visit: Payer: Self-pay | Admitting: Interventional Cardiology

## 2015-02-18 ENCOUNTER — Other Ambulatory Visit: Payer: Self-pay | Admitting: Interventional Cardiology

## 2015-02-18 NOTE — Telephone Encounter (Signed)
Patient is past due for a lipid panel, but has an appt 03/27/15. Ok to extend refill until then?

## 2015-02-19 NOTE — Telephone Encounter (Signed)
Yes

## 2015-02-20 ENCOUNTER — Ambulatory Visit (INDEPENDENT_AMBULATORY_CARE_PROVIDER_SITE_OTHER): Payer: Medicare Other | Admitting: Podiatry

## 2015-02-20 DIAGNOSIS — L609 Nail disorder, unspecified: Secondary | ICD-10-CM | POA: Diagnosis not present

## 2015-02-20 DIAGNOSIS — L608 Other nail disorders: Secondary | ICD-10-CM

## 2015-02-20 NOTE — Patient Instructions (Signed)
Today her diabetic foot exam demonstrated adequate pulses in your right and left feet and adequate feeling I trim the controlled edges in your toenails on the right and left feet There was slight bleeding on the inside edge of the right big toenail and I applied triple antibiotic ointment and Band-Aid Continue to apply topical antibiotic ointment such as triple antibiotic ointment to the right great toenail daily for the next several days Return as needed or yearly  Diabetes and Foot Care Diabetes may cause you to have problems because of poor blood supply (circulation) to your feet and legs. This may cause the skin on your feet to become thinner, break easier, and heal more slowly. Your skin may become dry, and the skin may peel and crack. You may also have nerve damage in your legs and feet causing decreased feeling in them. You may not notice minor injuries to your feet that could lead to infections or more serious problems. Taking care of your feet is one of the most important things you can do for yourself.  HOME CARE INSTRUCTIONS  Wear shoes at all times, even in the house. Do not go barefoot. Bare feet are easily injured.  Check your feet daily for blisters, cuts, and redness. If you cannot see the bottom of your feet, use a mirror or ask someone for help.  Wash your feet with warm water (do not use hot water) and mild soap. Then pat your feet and the areas between your toes until they are completely dry. Do not soak your feet as this can dry your skin.  Apply a moisturizing lotion or petroleum jelly (that does not contain alcohol and is unscented) to the skin on your feet and to dry, brittle toenails. Do not apply lotion between your toes.  Trim your toenails straight across. Do not dig under them or around the cuticle. File the edges of your nails with an emery board or nail file.  Do not cut corns or calluses or try to remove them with medicine.  Wear clean socks or stockings every  day. Make sure they are not too tight. Do not wear knee-high stockings since they may decrease blood flow to your legs.  Wear shoes that fit properly and have enough cushioning. To break in new shoes, wear them for just a few hours a day. This prevents you from injuring your feet. Always look in your shoes before you put them on to be sure there are no objects inside.  Do not cross your legs. This may decrease the blood flow to your feet.  If you find a minor scrape, cut, or break in the skin on your feet, keep it and the skin around it clean and dry. These areas may be cleansed with mild soap and water. Do not cleanse the area with peroxide, alcohol, or iodine.  When you remove an adhesive bandage, be sure not to damage the skin around it.  If you have a wound, look at it several times a day to make sure it is healing.  Do not use heating pads or hot water bottles. They may burn your skin. If you have lost feeling in your feet or legs, you may not know it is happening until it is too late.  Make sure your health care provider performs a complete foot exam at least annually or more often if you have foot problems. Report any cuts, sores, or bruises to your health care provider immediately. SEEK MEDICAL CARE IF:  You have an injury that is not healing.  You have cuts or breaks in the skin.  You have an ingrown nail.  You notice redness on your legs or feet.  You feel burning or tingling in your legs or feet.  You have pain or cramps in your legs and feet.  Your legs or feet are numb.  Your feet always feel cold. SEEK IMMEDIATE MEDICAL CARE IF:   There is increasing redness, swelling, or pain in or around a wound.  There is a red line that goes up your leg.  Pus is coming from a wound.  You develop a fever or as directed by your health care provider.  You notice a bad smell coming from an ulcer or wound.   This information is not intended to replace advice given to you by  your health care provider. Make sure you discuss any questions you have with your health care provider.   Document Released: 02/28/2000 Document Revised: 11/02/2012 Document Reviewed: 08/09/2012 Elsevier Interactive Patient Education [employer]2016 Elsevier Inc.

## 2015-02-20 NOTE — Progress Notes (Signed)
Patient ID: Yvonne GreavesBarbara S Butler, female   DOB: Nov 05, 1933, 81 y.o.   MRN: [id number]  Subjective: This patient presents today complaining of discomfort in her toenails including the hallux toenails. She describes attempting to trim the nails, however, they are uncomfortable along the borders. She is a known diabetic and denies any history of foot ulceration, claudication or amputation She has a history of chronic right lower extremity edema after harvesting a vein for coronary artery bypass surgery  Objective: Orientated 3  Vascular: Pitting edema bilaterally Right lower leg appears larger in volume than left No calf tenderness bilaterally DP and PT pulses 2/4 bilaterally Capillary reflex immediate bilaterally  Neurological:  Sensation to 10 g monofilament wire intact 4/4 bilaterally Vibratory sensation reactive bilaterally Ankle reflex equal and reactive bilaterally  Dermatological: No open skin lesions bilaterally Skin is dry and atrophic bilaterally The toenails are incurvated 1-5 bilaterally with normal texture nails Well-healed surgical scar medial right lower leg  Musculoskeletal: Medial March contour noted bilaterally No deformities noted bilaterally  Assessment: Satisfactory neurovascular status Chronic edema. Per patient, lower extremities  Plan: Debridement toenails 1 through 5 bilaterally Small pinpoint bleeding medial right margin of hallux nail treated with topical antibiotic ointment and Band-Aid Patient is made aware of this and will continue to apply topical antibiotic ointment this area for the next several days  Return as needed or yearly

## 2015-03-27 ENCOUNTER — Ambulatory Visit (INDEPENDENT_AMBULATORY_CARE_PROVIDER_SITE_OTHER): Payer: Medicare Other | Admitting: Interventional Cardiology

## 2015-03-27 ENCOUNTER — Encounter: Payer: Self-pay | Admitting: Interventional Cardiology

## 2015-03-27 VITALS — BP 128/70 | HR 65 | Ht 64.0 in | Wt 153.0 lb

## 2015-03-27 DIAGNOSIS — I4891 Unspecified atrial fibrillation: Secondary | ICD-10-CM | POA: Insufficient documentation

## 2015-03-27 DIAGNOSIS — I482 Chronic atrial fibrillation, unspecified: Secondary | ICD-10-CM

## 2015-03-27 DIAGNOSIS — I25810 Atherosclerosis of coronary artery bypass graft(s) without angina pectoris: Secondary | ICD-10-CM

## 2015-03-27 DIAGNOSIS — R413 Other amnesia: Secondary | ICD-10-CM | POA: Diagnosis not present

## 2015-03-27 DIAGNOSIS — E785 Hyperlipidemia, unspecified: Secondary | ICD-10-CM

## 2015-03-27 DIAGNOSIS — I10 Essential (primary) hypertension: Secondary | ICD-10-CM | POA: Diagnosis not present

## 2015-03-27 LAB — CBC WITH DIFFERENTIAL/PLATELET
BASOS PCT: 0 % (ref 0–1)
Basophils Absolute: 0 10*3/uL (ref 0.0–0.1)
EOS ABS: 0.1 10*3/uL (ref 0.0–0.7)
EOS PCT: 1 % (ref 0–5)
HCT: 41.2 % (ref 36.0–46.0)
Hemoglobin: 13.8 g/dL (ref 12.0–15.0)
LYMPHS ABS: 1.6 10*3/uL (ref 0.7–4.0)
Lymphocytes Relative: 16 % (ref 12–46)
MCH: 31.6 pg (ref 26.0–34.0)
MCHC: 33.5 g/dL (ref 30.0–36.0)
MCV: 94.3 fL (ref 78.0–100.0)
MONOS PCT: 9 % (ref 3–12)
MPV: 10 fL (ref 8.6–12.4)
Monocytes Absolute: 0.9 10*3/uL (ref 0.1–1.0)
Neutro Abs: 7.5 10*3/uL (ref 1.7–7.7)
Neutrophils Relative %: 74 % (ref 43–77)
PLATELETS: 361 10*3/uL (ref 150–400)
RBC: 4.37 MIL/uL (ref 3.87–5.11)
RDW: 14.3 % (ref 11.5–15.5)
WBC: 10.1 10*3/uL (ref 4.0–10.5)

## 2015-03-27 LAB — BASIC METABOLIC PANEL
BUN: 16 mg/dL (ref 7–25)
CALCIUM: 10 mg/dL (ref 8.6–10.4)
CHLORIDE: 104 mmol/L (ref 98–110)
CO2: 30 mmol/L (ref 20–31)
CREATININE: 1.24 mg/dL — AB (ref 0.60–0.93)
Glucose, Bld: 130 mg/dL — ABNORMAL HIGH (ref 65–99)
Potassium: 4.6 mmol/L (ref 3.5–5.3)
Sodium: 143 mmol/L (ref 135–146)

## 2015-03-27 NOTE — Progress Notes (Signed)
Cardiology Office Note   Date:  03/27/2015   ID:  Yvonne Butler, DOB Sep 04, 1933, MRN [id number]  PCP:  Ginette OttoSTONEKING,HAL THOMAS, MD  Cardiologist:  Lesleigh NoeSMITH III,HENRY W, MD   Chief Complaint  Patient presents with  . Atrial Fibrillation      History of Present Illness: Yvonne Butler is a 82 y.o. female who presents for coronary artery disease, prior coronary bypass surgery, hypertension, hyperlipidemia, diabetes mellitus, and frailty.  Asymptomatic without any CV complaints. Definitely having difficulty with vision and mobility. Denies nitroglycerin use. No orthopnea or PND. No lower extremity swelling.    Past Medical History  Diagnosis Date  . CAD (coronary artery disease)     with CABG- Dr. [name]  . Hypercholesteremia   . Coronary atherosclerosis of native coronary artery   . Vitamin D deficiency   . Hyperlipidemia   . Osteopenia   . Diverticulosis   . Diabetes   . Complication of anesthesia   . PONV (postoperative nausea and vomiting)     Past Surgical History  Procedure Laterality Date  . Coronary artery bypass graft       in with LIMA to LAD, SVG to the circumflex, SVG to the diagonal, and SVG to the PDA.  [name]. Appendectomy    . Ovarian cyst & fallopian tubes    . Hip surgery Right 02/20/2013    HEMI -BIPOLAR ARTHROPLASTY  . Hip arthroplasty Right 02/19/2013    Procedure: HEMI ARTHROPLASTY BIPOLAR HIP CEMENTED VS UNCEMENTED;  Surgeon: Harvie JuniorJohn L Graves, MD;  Location: [facility] OR;  Service: Orthopedics;  Laterality: Right;     Current Outpatient Prescriptions  Medication Sig Dispense Refill  . amLODipine (NORVASC) 5 MG tablet Take 5 mg by mouth daily.     [name]. aspirin 81 MG tablet Take 81 mg by mouth daily.    [name]. atorvastatin (LIPITOR) 40 MG tablet Take 1 tablet by mouth.    . Choline Fenofibrate (TRILIPIX) 45 MG capsule Take 45 mg by mouth daily.    [name]. donepezil (ARICEPT) 5 MG tablet Take 5 mg by mouth at bedtime.    . furosemide (LASIX) 20 MG tablet Take 1 tablet by  mouth daily.    [name]. gabapentin (NEURONTIN) 100 MG capsule TAKE 1 CAPSULE EVERYDAY AT BEDTIME  5  . glimepiride (AMARYL) 1 MG tablet Take 1 mg by mouth daily with breakfast.     . metoprolol (LOPRESSOR) 50 MG tablet Take 50 mg by mouth 2 (two) times daily.     [name]. NAMENDA XR 28 MG CP24 24 hr capsule TAKE 1 CAPSULE ONCE A DAY  6  . traMADol (ULTRAM) 50 MG tablet      No current facility-administered medications for this visit.    Allergies:   Review of patient's allergies indicates no known allergies.    Social History:  The patient  reports that she has quit smoking. Her smoking use included Cigarettes. She has never used smokeless tobacco. She reports that she does not drink alcohol or use illicit drugs.   Family History:  The patient's family history includes Heart disease in her father.    ROS:  Please see the history of present illness.   Otherwise, review of systems are positive for difficulty with hearing, constipation, and mobility. No falls or head trauma. No blood in her urine or stool..   All other systems are reviewed and negative.    PHYSICAL EXAM: VS:  BP 128/70 mmHg  Pulse 65  Ht 5\' 4"  (1.626  m)  Wt 153 lb (69.4 kg)  BMI 26.25 kg/m2 , BMI Body mass index is 26.25 kg/(m^2). GEN: Well nourished, well developed, in no acute distress HEENT: normal Neck: no JVD, carotid bruits, or masses Cardiac: IIRR.  There is no murmur, rub, or gallop. There is no edema. Respiratory:  clear to auscultation bilaterally, normal work of breathing. GI: soft, nontender, nondistended, + BS MS: no deformity or atrophy Skin: warm and dry, no rash Neuro:  Strength and sensation are intact Psych: euthymic mood, full affect   EKG:  EKG is ordered today. The ekg reveals atrial fibrillation at 65 bpm with nonspecific ST-T abnormality and poor R-wave progression.   Recent Labs: 07/19/2014: ALT 14    Lipid Panel    Component Value Date/Time   CHOL 216* 07/19/2014 0828   TRIG 105.0 07/19/2014  0828   HDL 74.40 07/19/2014 0828   CHOLHDL 3 07/19/2014 0828   VLDL 21.0 07/19/2014 0828   LDLCALC 121* 07/19/2014 0828      Wt Readings from Last 3 Encounters:  03/27/15 153 lb (69.4 kg)  01/05/14 155 lb 1.9 oz (70.362 kg)  02/19/13 159 lb (72.122 kg)      Other studies Reviewed: Additional studies/ records that were reviewed today include: Reviewed prior EKGs and electronic health record for evidence of atrial fib.. The findings include atrial fibrillation identified today is new..    ASSESSMENT AND PLAN:  1. Coronary atherosclerosis of autologous vein bypass graft without angina Asymptomatic  2. Essential hypertension, benign Controlled  3. Hyperlipidemia Hyperlipidemia  4. Memory loss Progressive and associated with hearing loss.  5. Chronic atrial fibrillation (HCC) Presumed chronic, first identified today on EKG, asymptomatic. CHADS VASCULAR is 4. We discussed the pros and cons of anticoagulation. Her bleeding risk is minimal but I am concerned about her frailty and mobility which could lead to falls.     Current medicines are reviewed at length with the patient today.  The patient has the following concerns regarding medicines: Will start Apixiban 5 mg twice per day after basic laboratory data and monitoring performed..  The following changes/actions have been instituted:    2-D Doppler echo  CBC and basic metabolic panel  24-hour Holter monitor to determine whether atrial fibrillation rate is controlled and if A. fib is continuous.  Given the circumstances, it appears that she has good rate control and that rhythm control would not be necessary.  1 month follow-up on anticoagulation therapy to exclude bleeding and to determine how she is fairing on therapy.  Labs/ tests ordered today include:  No orders of the defined types were placed in this encounter.     Disposition:   FU with HS in 1 year  Signed, Lesleigh Noe, MD  03/27/2015 8:49 AM      [facility] [address] [place], [place], [place]  [address] Phone: [phone]; Fax: [phone]

## 2015-03-27 NOTE — Patient Instructions (Signed)
Medication Instructions:  Your physician has recommended you make the following change in your medication:  START Eliquis 5mg  twice daily (We will call your and let you know when to begin taking)   Labwork: Bmet, Cbc today  Testing/Procedures: Your physician has recommended that you wear a holter monitor. Holter monitors are medical devices that record the heart's electrical activity. Doctors most often use these monitors to diagnose arrhythmias. Arrhythmias are problems with the speed or rhythm of the heartbeat. The monitor is a small, portable device. You can wear one while you do your normal daily activities. This is usually used to diagnose what is causing palpitations/syncope (passing out).  Your physician has requested that you have an echocardiogram. Echocardiography is a painless test that uses sound waves to create images of your heart. It provides your doctor with information about the size and shape of your heart and how well your heart's chambers and valves are working. This procedure takes approximately one hour. There are no restrictions for this procedure.    Follow-Up: You have a follow up appointment scheduled on 05/01/15 @ 10:45am  Any Other Special Instructions Will Be Listed Below (If Applicable).     If you need a refill on your cardiac medications before your next appointment, please call your pharmacy.

## 2015-03-29 ENCOUNTER — Telehealth: Payer: Self-pay

## 2015-03-29 ENCOUNTER — Telehealth: Payer: Self-pay | Admitting: *Deleted

## 2015-03-29 MED ORDER — APIXABAN 5 MG PO TABS: 5.0000 mg | ORAL_TABLET | Freq: Two times a day (BID) | ORAL | Status: DC

## 2015-03-29 NOTE — Telephone Encounter (Signed)
Pt has been notified of lab results and recommendations per Dr. [name] ok to start Eliquis 5 mg BID, Rx sent in today. pt vebalized understanding to plan of care.

## 2015-03-29 NOTE — Telephone Encounter (Signed)
PRIOR AUTH FOR ELIQUIS 5MG  SENT TO [facility]

## 2015-04-01 ENCOUNTER — Telehealth: Payer: Self-pay

## 2015-04-01 NOTE — Telephone Encounter (Signed)
Approval of Eliquis good through 03/15/2016. [place] [id number].

## 2015-04-09 ENCOUNTER — Ambulatory Visit (HOSPITAL_COMMUNITY): Payer: Medicare Other | Attending: Cardiovascular Disease

## 2015-04-09 ENCOUNTER — Ambulatory Visit (INDEPENDENT_AMBULATORY_CARE_PROVIDER_SITE_OTHER): Payer: Medicare Other

## 2015-04-09 ENCOUNTER — Other Ambulatory Visit: Payer: Self-pay

## 2015-04-09 DIAGNOSIS — I517 Cardiomegaly: Secondary | ICD-10-CM | POA: Insufficient documentation

## 2015-04-09 DIAGNOSIS — I071 Rheumatic tricuspid insufficiency: Secondary | ICD-10-CM | POA: Insufficient documentation

## 2015-04-09 DIAGNOSIS — E785 Hyperlipidemia, unspecified: Secondary | ICD-10-CM | POA: Diagnosis not present

## 2015-04-09 DIAGNOSIS — I482 Chronic atrial fibrillation, unspecified: Secondary | ICD-10-CM

## 2015-04-09 DIAGNOSIS — I340 Nonrheumatic mitral (valve) insufficiency: Secondary | ICD-10-CM | POA: Insufficient documentation

## 2015-04-09 DIAGNOSIS — E119 Type 2 diabetes mellitus without complications: Secondary | ICD-10-CM | POA: Diagnosis not present

## 2015-04-09 DIAGNOSIS — I10 Essential (primary) hypertension: Secondary | ICD-10-CM | POA: Diagnosis not present

## 2015-04-15 ENCOUNTER — Other Ambulatory Visit: Payer: Self-pay | Admitting: Interventional Cardiology

## 2015-04-18 ENCOUNTER — Telehealth: Payer: Self-pay

## 2015-04-18 MED ORDER — METOPROLOL TARTRATE 25 MG PO TABS: 25.0000 mg | ORAL_TABLET | Freq: Two times a day (BID) | ORAL | Status: DC

## 2015-04-18 NOTE — Telephone Encounter (Signed)
Pt husband aware of holter results and Dr.Darden's recommendation with verbal understanding.   Continuous atrial fibrillation throughout the period of monitoring  Heart rate ranged between 44 and 96 bpm  Pauses greater than 3 seconds during sleep  No reported symptoms  Abnormal Holter monitor with slow ventricular response and pauses greater than 3 seconds. Pauses occurred during sleep. Clinical correlation is indicated.  Heart rate is a little slow. Decrease metoprolol to 25 mg BID

## 2015-04-18 NOTE — Telephone Encounter (Signed)
-----   Message from Lyn Records, MD sent at 04/18/2015  1:27 PM EST ----- Heart rate is a little slow. Decrease metoprolol to 25 mg BID.

## 2015-05-01 ENCOUNTER — Ambulatory Visit (INDEPENDENT_AMBULATORY_CARE_PROVIDER_SITE_OTHER): Payer: Medicare Other | Admitting: Interventional Cardiology

## 2015-05-01 ENCOUNTER — Encounter: Payer: Self-pay | Admitting: Interventional Cardiology

## 2015-05-01 VITALS — BP 136/52 | HR 66 | Ht 64.0 in | Wt 154.8 lb

## 2015-05-01 DIAGNOSIS — I482 Chronic atrial fibrillation, unspecified: Secondary | ICD-10-CM

## 2015-05-01 DIAGNOSIS — I25810 Atherosclerosis of coronary artery bypass graft(s) without angina pectoris: Secondary | ICD-10-CM | POA: Diagnosis not present

## 2015-05-01 NOTE — Progress Notes (Signed)
Cardiology Office Note   Date:  05/01/2015   ID:  Yvonne Butler, DOB Oct 26, 1933, MRN [id number]  PCP:  Ginette Otto, MD  Cardiologist:  Lesleigh Noe, MD   No chief complaint on file.     History of Present Illness: Yvonne Butler is a 82 y.o. female who presents for  CAD, hypertension, chronic atrial fibrillation, and anticoagulation.   No significant problems since starting eliquis therapy.  She denies dizziness or falls. No blood in the urine or stool. Still asymptomatic with reference to atrial fibrillation.    Past Medical History  Diagnosis Date  . CAD (coronary artery disease)     with CABG- Dr. Katrinka Blazing  . Hypercholesteremia   . Coronary atherosclerosis of native coronary artery   . Vitamin D deficiency   . Hyperlipidemia   . Osteopenia   . Diverticulosis   . Diabetes (HCC)   . Complication of anesthesia   . PONV (postoperative nausea and vomiting)     Past Surgical History  Procedure Laterality Date  . Coronary artery bypass graft       in with LIMA to LAD, SVG to the circumflex, SVG to the diagonal, and SVG to the PDA.  [name] Appendectomy    . Ovarian cyst & fallopian tubes    . Hip surgery Right 02/20/2013    HEMI -BIPOLAR ARTHROPLASTY  . Hip arthroplasty Right 02/19/2013    Procedure: HEMI ARTHROPLASTY BIPOLAR HIP CEMENTED VS UNCEMENTED;  Surgeon: Harvie Junior, MD;  Location: [facility] OR;  Service: Orthopedics;  Laterality: Right;     Current Outpatient Prescriptions  Medication Sig Dispense Refill  . amLODipine (NORVASC) 5 MG tablet Take 5 mg by mouth daily.     [name] apixaban (ELIQUIS) 5 MG TABS tablet Take 1 tablet (5 mg total) by mouth 2 (two) times daily. 60 tablet 11  . aspirin 81 MG tablet Take 81 mg by mouth daily.    [name] atorvastatin (LIPITOR) 40 MG tablet Take 1 tablet by mouth.    [name] atorvastatin (LIPITOR) 40 MG tablet TAKE 1 TABLET (40 MG TOTAL) BY MOUTH DAILY. 30 tablet 11  . Choline Fenofibrate (TRILIPIX) 45 MG capsule Take 45 mg by  mouth daily.    [name] donepezil (ARICEPT) 5 MG tablet Take 5 mg by mouth at bedtime.    . furosemide (LASIX) 20 MG tablet Take 1 tablet by mouth daily.    [name] gabapentin (NEURONTIN) 100 MG capsule TAKE 1 CAPSULE EVERYDAY AT BEDTIME  5  . glimepiride (AMARYL) 1 MG tablet Take 1 mg by mouth daily with breakfast.     . metoprolol (LOPRESSOR) 25 MG tablet Take 1 tablet (25 mg total) by mouth 2 (two) times daily.    [name] NAMENDA XR 28 MG CP24 24 hr capsule TAKE 1 CAPSULE ONCE A DAY  6  . traMADol (ULTRAM) 50 MG tablet Take 50 mg by mouth as needed for moderate pain. Patient unsure of frequency     No current facility-administered medications for this visit.    Allergies:   Review of patient's allergies indicates no known allergies.    Social History:  The patient  reports that she has quit smoking. Her smoking use included Cigarettes. She has never used smokeless tobacco. She reports that she does not drink alcohol or use illicit drugs.   Family History:  The patient's family history includes Heart disease in her father.    ROS:  Please see the history of present illness.  Otherwise, review of systems are positive for none.   All other systems are reviewed and negative.    PHYSICAL EXAM: VS:  BP 136/52 mmHg  Pulse 66  Ht  (1.626 m)  Wt 154 lb 12.8 oz (70.217 kg)  BMI 26.56 kg/m2 , BMI Body mass index is 26.56 kg/(m^2). GEN: Well nourished, well developed, in no acute distress HEENT: normal Neck: no JVD, carotid bruits, or masses Cardiac: IIRR.  There is no murmur, rub, or gallop. There is no edema. Respiratory:  clear to auscultation bilaterally, normal work of breathing. GI: soft, nontender, nondistended, + BS MS: no deformity or atrophy Skin: warm and dry, no rash Neuro:  Strength and sensation are intact Psych: euthymic mood, full affect   EKG:  EKG is ordered today. The ekg reveals  Atrial fibrillation with controlled rate.   Recent Labs: 07/19/2014: ALT 14 03/27/2015: BUN 16;  Creat 1.24*; Hemoglobin 13.8; Platelets 361; Potassium 4.6; Sodium 143    Lipid Panel    Component Value Date/Time   CHOL 216* 07/19/2014 0828   TRIG 105.0 07/19/2014 0828   HDL 74.40 07/19/2014 0828   CHOLHDL 3 07/19/2014 0828   VLDL 21.0 07/19/2014 0828   LDLCALC 121* 07/19/2014 0828      Wt Readings from Last 3 Encounters:  05/01/15 154 lb 12.8 oz (70.217 kg)  03/27/15 153 lb (69.4 kg)  01/05/14 155 lb 1.9 oz (70.362 kg)      Other studies Reviewed: Additional studies/ records that were reviewed today include:  none. The findings include  none.    ASSESSMENT AND PLAN:  1. Coronary atherosclerosis of autologous vein bypass graft without angina  Asymptomatic  2. Chronic atrial fibrillation (HCC)  Chronic with slow ventricular response now improved on lower dose  Metoprolol.   3. Anticoagulation therapy  No bleeding complications or other issues   Current medicines are reviewed at length with the patient today.  The patient has the following concerns regarding medicines: none.  The following changes/actions have been instituted:     Notify if any head trauma , bleeding, or other complaints.  Labs/ tests ordered today include:  No orders of the defined types were placed in this encounter.     Disposition:   FU with HS in 6 months  Signed, Lesleigh Noe, MD  05/01/2015 11:18 AM    [facility] [address] [place], [place], [place]  [address] Phone: [phone]; Fax: [phone]

## 2015-05-01 NOTE — Patient Instructions (Signed)
Medication Instructions:  Your physician recommends that you continue on your current medications as directed. Please refer to the Current Medication list given to you today.   Labwork: None ordered.  Testing/Procedures: None ordered  Follow-Up: Your physician wants you to follow-up in 6 months. You will receive a reminder letter in the mail two months in advance. If you don't receive a letter, please call our office to schedule the follow-up appointment.   Any Other Special Instructions Will Be Listed Below (If Applicable).     If you need a refill on your cardiac medications before your next appointment, please call your pharmacy.   

## 2015-05-16 ENCOUNTER — Encounter

## 2015-05-16 ENCOUNTER — Inpatient Hospital Stay: Admit: 2015-05-16 | Payer: MEDICARE | Primary: Internal Medicine

## 2015-05-16 DIAGNOSIS — M0609 Rheumatoid arthritis without rheumatoid factor, multiple sites: Secondary | ICD-10-CM

## 2015-06-03 ENCOUNTER — Other Ambulatory Visit: Payer: Self-pay | Admitting: Geriatric Medicine

## 2015-06-03 ENCOUNTER — Ambulatory Visit
Admission: RE | Admit: 2015-06-03 | Discharge: 2015-06-03 | Disposition: A | Discharge status: Home / Self Care | Payer: Medicare Other | Source: Ambulatory Visit | Attending: Geriatric Medicine | Admitting: Geriatric Medicine

## 2015-06-03 DIAGNOSIS — J4521 Mild intermittent asthma with (acute) exacerbation: Secondary | ICD-10-CM

## 2015-09-11 ENCOUNTER — Ambulatory Visit: Payer: Medicare Other | Admitting: Podiatry

## 2015-11-21 ENCOUNTER — Ambulatory Visit: Payer: Medicare Other | Admitting: Interventional Cardiology

## 2016-01-07 ENCOUNTER — Emergency Department (HOSPITAL_COMMUNITY)
Admission: EM | Admit: 2016-01-07 | Discharge: 2016-01-07 | Disposition: A | Discharge status: Home / Self Care | Payer: Medicare Other | Attending: Emergency Medicine | Admitting: Emergency Medicine

## 2016-01-07 ENCOUNTER — Telehealth: Payer: Self-pay | Admitting: Interventional Cardiology

## 2016-01-07 ENCOUNTER — Emergency Department (HOSPITAL_COMMUNITY): Payer: Medicare Other

## 2016-01-07 ENCOUNTER — Encounter (HOSPITAL_COMMUNITY): Payer: Self-pay

## 2016-01-07 DIAGNOSIS — W19XXXA Unspecified fall, initial encounter: Secondary | ICD-10-CM | POA: Insufficient documentation

## 2016-01-07 DIAGNOSIS — S0990XA Unspecified injury of head, initial encounter: Secondary | ICD-10-CM | POA: Diagnosis present

## 2016-01-07 DIAGNOSIS — Z87891 Personal history of nicotine dependence: Secondary | ICD-10-CM | POA: Diagnosis not present

## 2016-01-07 DIAGNOSIS — Z7982 Long term (current) use of aspirin: Secondary | ICD-10-CM | POA: Diagnosis not present

## 2016-01-07 DIAGNOSIS — E119 Type 2 diabetes mellitus without complications: Secondary | ICD-10-CM | POA: Diagnosis not present

## 2016-01-07 DIAGNOSIS — Z7901 Long term (current) use of anticoagulants: Secondary | ICD-10-CM | POA: Insufficient documentation

## 2016-01-07 DIAGNOSIS — Y939 Activity, unspecified: Secondary | ICD-10-CM | POA: Insufficient documentation

## 2016-01-07 DIAGNOSIS — Y92129 Unspecified place in nursing home as the place of occurrence of the external cause: Secondary | ICD-10-CM | POA: Diagnosis not present

## 2016-01-07 DIAGNOSIS — I2510 Atherosclerotic heart disease of native coronary artery without angina pectoris: Secondary | ICD-10-CM | POA: Diagnosis not present

## 2016-01-07 DIAGNOSIS — Z951 Presence of aortocoronary bypass graft: Secondary | ICD-10-CM | POA: Diagnosis not present

## 2016-01-07 DIAGNOSIS — Z7984 Long term (current) use of oral hypoglycemic drugs: Secondary | ICD-10-CM | POA: Insufficient documentation

## 2016-01-07 DIAGNOSIS — Y999 Unspecified external cause status: Secondary | ICD-10-CM | POA: Insufficient documentation

## 2016-01-07 DIAGNOSIS — S0003XA Contusion of scalp, initial encounter: Secondary | ICD-10-CM | POA: Insufficient documentation

## 2016-01-07 NOTE — ED Notes (Signed)
Pt ambulated to restroom. Pt tolerated well.  

## 2016-01-07 NOTE — Telephone Encounter (Signed)
Reviewed w/ DOD, Dr. Elease HashimotoNahser -- order to hold Eliquis x 2 days, then restart.  D/C ASA.  Upon speaking with assisted living facility they tell me that patient has not been taking Eliquis since she was moved there on 10/7 to assisted living/memory care unit after husbands surgery (while husband recovers).  They explain that PCP paperwork transferring her onto that unit did not have Eliquis on her MAR.  She has been taking ASA 81mg  daily. Informed [facility] that I would send to Dr. [name] for review.  Advised to hold Eliquis until they hear back from our office.  Informed that most likely we would have patient re-start Eliquis and stop ASA.

## 2016-01-07 NOTE — ED Provider Notes (Signed)
[facility]-EMERGENCY DEPT Provider Note   CSN: [id number] Arrival date & time: 01/07/16  0709     History   Chief Complaint Chief Complaint  Patient presents with  . Fall    HPI Yvonne Butler is a 82 y.o. female.  HPI  The patient is an 82 year old female, she has a history of dementia and cannot give any further history however according to the paramedics the patient had an unwitnessed fall at her nursing facility. Initially complained of bilateral arm and leg pain however this time has no complaints. She denies headache or neck pain, denies chest pain or shortness of breath or any other complaints.  Past Medical History:  Diagnosis Date  . CAD (coronary artery disease)    with CABG- Dr. Katrinka Blazing  . Complication of anesthesia   . Coronary atherosclerosis of native coronary artery   . Diabetes (HCC)   . Diverticulosis   . Hypercholesteremia   . Hyperlipidemia   . Osteopenia   . PONV (postoperative nausea and vomiting)   . Vitamin D deficiency     Patient Active Problem List   Diagnosis Date Noted  . Atrial fibrillation (HCC) 03/27/2015  . Other bilateral secondary osteoarthritis of knee 02/21/2013  . Femoral neck fracture (HCC) 02/19/2013  . Leukocytosis 02/19/2013  . Memory loss 02/19/2013  . Fracture of femoral neck, right (HCC) 02/19/2013  . Coronary atherosclerosis of autologous vein bypass graft 01/03/2013    Class: Diagnosis of  . Essential hypertension, benign 01/03/2013    Class: Diagnosis of  . Other and unspecified hyperlipidemia 01/03/2013    Class: Chronic  . DM ketoacidosis type II, uncontrolled (HCC) 01/03/2013    Class: Chronic    Past Surgical History:  Procedure Laterality Date  . APPENDECTOMY    . CORONARY ARTERY BYPASS GRAFT      in with LIMA to LAD, SVG to the circumflex, SVG to the diagonal, and SVG to the PDA.  [name] HIP ARTHROPLASTY Right 02/19/2013   Procedure: HEMI ARTHROPLASTY BIPOLAR HIP CEMENTED VS UNCEMENTED;  Surgeon: Harvie Junior, MD;   Location: [facility] OR;  Service: Orthopedics;  Laterality: Right;  . HIP SURGERY Right 02/20/2013   HEMI -BIPOLAR ARTHROPLASTY  . Ovarian Cyst & Fallopian Tubes      OB History    No data available       Home Medications    Prior to Admission medications   Medication Sig Start Date End Date Taking? Authorizing Provider  aspirin 81 MG tablet Take 81 mg by mouth daily.   Yes Historical Provider, MD  busPIRone (BUSPAR) 15 MG tablet Take 15 mg by mouth 3 (three) times daily.   Yes Historical Provider, MD  Choline Fenofibrate (TRILIPIX) 45 MG capsule Take 45 mg by mouth daily.   Yes Historical Provider, MD  donepezil (ARICEPT) 5 MG tablet Take 5 mg by mouth at bedtime.   Yes Historical Provider, MD  furosemide (LASIX) 20 MG tablet Take 1 tablet by mouth daily. 03/25/15  Yes Historical Provider, MD  gabapentin (NEURONTIN) 100 MG capsule TAKE 1 CAPSULE EVERYDAY AT BEDTIME 02/03/15  Yes Historical Provider, MD  glimepiride (AMARYL) 1 MG tablet Take 1 mg by mouth daily with breakfast.  01/01/14  Yes Historical Provider, MD  metoprolol (LOPRESSOR) 25 MG tablet Take 1 tablet (25 mg total) by mouth 2 (two) times daily. 04/18/15  Yes Lyn Records, MD  Multiple Vitamin (MULTIVITAMIN) tablet Take 1 tablet by mouth daily.   Yes Historical Provider, MD  NAMENDA XR  28 MG CP24 24 hr capsule TAKE 1 CAPSULE ONCE A DAY 01/09/15  Yes Historical Provider, MD  pravastatin (PRAVACHOL) 40 MG tablet Take 40 mg by mouth daily.   Yes Historical Provider, MD  traMADol (ULTRAM) 50 MG tablet Take 50 mg by mouth as needed for moderate pain. Patient unsure of frequency 04/06/13  Yes Historical Provider, MD  apixaban (ELIQUIS) 5 MG TABS tablet Take 1 tablet (5 mg total) by mouth 2 (two) times daily. Patient not taking: Reported on 01/07/2016 03/29/15   Lyn Records, MD  atorvastatin (LIPITOR) 40 MG tablet TAKE 1 TABLET (40 MG TOTAL) BY MOUTH DAILY. Patient not taking: Reported on 01/07/2016 04/16/15   Lyn Records, MD    Family  History Family History  Problem Relation Age of Onset  . Heart disease Father     Social History Social History  Substance Use Topics  . Smoking status: Former Smoker    Types: Cigarettes  . Smokeless tobacco: Never Used  . Alcohol use No     Allergies   Review of patient's allergies indicates no known allergies.   Review of Systems Review of Systems  Unable to perform ROS: Dementia     Physical Exam Updated Vital Signs BP 177/80 (BP Location: Right Arm)   Pulse 79   Temp 97.7 F (36.5 C) (Oral)   Resp 12   Ht 5\' 3"  (1.6 m)   Wt 154 lb (69.9 kg)   SpO2 98%   BMI 27.28 kg/m   Physical Exam  Constitutional: She appears well-developed and well-nourished. No distress.  HENT:  Head: Normocephalic.  Mouth/Throat: Oropharynx is clear and moist. No oropharyngeal exudate.  Hematoma to the right posterior occiput, no laceration  Eyes: Conjunctivae and EOM are normal. Pupils are equal, round, and reactive to light. Right eye exhibits no discharge. Left eye exhibits no discharge. No scleral icterus.  Neck: No JVD present. No thyromegaly present.  Mild tenderness to palpation over the upper cervical spine and paraspinal muscles  Cardiovascular: Normal rate, regular rhythm, normal heart sounds and intact distal pulses.  Exam reveals no gallop and no friction rub.   No murmur heard. Pulmonary/Chest: Effort normal and breath sounds normal. No respiratory distress. She has no wheezes. She has no rales.  Abdominal: Soft. Bowel sounds are normal. She exhibits no distension and no mass. There is no tenderness.  Musculoskeletal: Normal range of motion. She exhibits no edema or tenderness.  Joints are diffusely supple, compartments are diffusely soft, no deformities, able to straight leg raise and perform all commands without complaint pain or difficulty  Lymphadenopathy:    She has no cervical adenopathy.  Neurological: She is alert. Coordination normal.  Follows commands without  difficulty, speech is clear, memory is not intact, the patient is pleasant and interactive and follows commands  Skin: Skin is warm and dry. No rash noted. No erythema.  Psychiatric: She has a normal mood and affect. Her behavior is normal.  Nursing note and vitals reviewed.    ED Treatments / Results  Labs (all labs ordered are listed, but only abnormal results are displayed) Labs Reviewed - No data to display  EKG  EKG Interpretation  Date/Time:  Tuesday January 07 2016 07:35:56 EDT Ventricular Rate:  65 PR Interval:    QRS Duration: 86 QT Interval:  437 QTC Calculation: 455 R Axis:   84 Text Interpretation:  Atrial fibrillation Borderline right axis deviation Probable LVH with secondary repol abnrm Since last tracing Atrial fibrillation now  present replacing NSR Confirmed by Dariane Natzke  MD, Fantasha Daniele ([id number]) on 01/07/2016 8:09:30 AM       Radiology Ct Head Wo Contrast  Result Date: 01/07/2016 CLINICAL DATA:  Unwitnessed fall today. History of dementia. Right-sided headache and posterior neck pain. EXAM: CT HEAD WITHOUT CONTRAST CT CERVICAL SPINE WITHOUT CONTRAST TECHNIQUE: Multidetector CT imaging of the head and cervical spine was performed following the standard protocol without intravenous contrast. Multiplanar CT image reconstructions of the cervical spine were also generated. COMPARISON:  08/12/2012 FINDINGS: CT HEAD FINDINGS Brain: Stable age related cerebral atrophy, ventriculomegaly and periventricular white matter disease. No extra-axial fluid collections are identified. No CT findings for acute hemispheric infarction or intracranial hemorrhage. No mass lesions. The brainstem and cerebellum are normal. Vascular: Vascular calcifications but no definite aneurysm or hyperdense vessels. Skull: No skull fracture Sinuses/Orbits: Knee paranasal sinuses mastoid air cells are clear. The globes are intact. Other: No scalp hematoma.  Small stable left frontal scalp lesion. CT CERVICAL SPINE  FINDINGS Alignment: Overall normal. Degenerative cervical spondylosis with minimal degenerative posterior subluxation of C5. Skull base and vertebrae: No acute fracture. Soft tissues and spinal canal: No abnormal prevertebral soft tissue swelling. Generous spinal canal. No canal compromise. The facets are normally aligned. No facet or laminar fractures. Disc levels: No significant spinal stenosis. Mild multilevel foraminal stenosis due to uncinate spurring and facet disease. Upper chest: The lung apices are grossly clear. Emphysematous changes are noted along with scarring. Other: 2.5 cm right thyroid lobe lesion is noted. Recommend ultrasound correlation and possible biopsy. Advanced atherosclerotic calcifications involving the carotid and vertebral arteries. IMPRESSION: 1. Stable age related first cerebral atrophy, ventriculomegaly and periventricular white matter disease. No acute intracranial findings or skull fracture. 2. Degenerative cervical spondylosis but no acute cervical spine fracture. 3. 2.5 cm right thyroid lobe lesion. Recommend ultrasound correlation and possible biopsy. 4. Advanced atherosclerotic calcifications involving the carotid and vertebral arteries. Electronically Signed   By: Rudie Meyer [name]   On: 01/07/2016 09:17   Ct Cervical Spine Wo Contrast  Result Date: 01/07/2016 CLINICAL DATA:  Unwitnessed fall today. History of dementia. Right-sided headache and posterior neck pain. EXAM: CT HEAD WITHOUT CONTRAST CT CERVICAL SPINE WITHOUT CONTRAST TECHNIQUE: Multidetector CT imaging of the head and cervical spine was performed following the standard protocol without intravenous contrast. Multiplanar CT image reconstructions of the cervical spine were also generated. COMPARISON:  08/12/2012 FINDINGS: CT HEAD FINDINGS Brain: Stable age related cerebral atrophy, ventriculomegaly and periventricular white matter disease. No extra-axial fluid collections are identified. No CT findings for acute  hemispheric infarction or intracranial hemorrhage. No mass lesions. The brainstem and cerebellum are normal. Vascular: Vascular calcifications but no definite aneurysm or hyperdense vessels. Skull: No skull fracture Sinuses/Orbits: Knee paranasal sinuses mastoid air cells are clear. The globes are intact. Other: No scalp hematoma.  Small stable left frontal scalp lesion. CT CERVICAL SPINE FINDINGS Alignment: Overall normal. Degenerative cervical spondylosis with minimal degenerative posterior subluxation of C5. Skull base and vertebrae: No acute fracture. Soft tissues and spinal canal: No abnormal prevertebral soft tissue swelling. Generous spinal canal. No canal compromise. The facets are normally aligned. No facet or laminar fractures. Disc levels: No significant spinal stenosis. Mild multilevel foraminal stenosis due to uncinate spurring and facet disease. Upper chest: The lung apices are grossly clear. Emphysematous changes are noted along with scarring. Other: 2.5 cm right thyroid lobe lesion is noted. Recommend ultrasound correlation and possible biopsy. Advanced atherosclerotic calcifications involving the carotid and vertebral arteries. IMPRESSION: 1.  Stable age related first cerebral atrophy, ventriculomegaly and periventricular white matter disease. No acute intracranial findings or skull fracture. 2. Degenerative cervical spondylosis but no acute cervical spine fracture. 3. 2.5 cm right thyroid lobe lesion. Recommend ultrasound correlation and possible biopsy. 4. Advanced atherosclerotic calcifications involving the carotid and vertebral arteries. Electronically Signed   By: Rudie MeyerP.  Gallerani [name]   On: 01/07/2016 09:17    Procedures Procedures (including critical care time)  Medications Ordered in ED Medications - No data to display   Initial Impression / Assessment and Plan / ED Course  I have reviewed the triage vital signs and the nursing notes.  Pertinent labs & imaging results that were  available during my care of the patient were reviewed by me and considered in my medical decision making (see chart for details).  Clinical Course    The patient is well-appearing, will obtain an EKG and a CT of the brain and cervical spine, otherwise the patient is benign in appearance without any extremity injuries, no rib tenderness, no thoracic injury or tenderness no abdominal tenderness, no arrhythmia. She is possibly on blood thinners though the paperwork that she comes with from her facility is slightly different than what we have in the computer on the electronic medical record suggesting that she may be on Eliquis.  Imaging neg - family informed of nodule on thyroid Ambulated Doubt hip or other sig MSK injury Stable for d/c.   Final Clinical Impressions(s) / ED Diagnoses   Final diagnoses:  Fall, initial encounter  Injury of head, initial encounter    New Prescriptions New Prescriptions   No medications on file     Eber HongBrian Ladarrell Cornwall, MD 01/07/16 1026

## 2016-01-07 NOTE — ED Triage Notes (Signed)
[facility] And c/o pain at knees hips and lower leg though that varies.Arrives  With towel roll.

## 2016-01-07 NOTE — Telephone Encounter (Signed)
New Message:  Pt had a fall today,went to [facility].She needs to know if pt is still on Eliquis and if so how is she supposed to be taking it?

## 2016-01-07 NOTE — Discharge Planning (Signed)
EDCM reviewed discharging chart for possible CM needs.  No needs identified.    

## 2016-01-07 NOTE — ED Notes (Signed)
Gave Pt [place] sand and water per [employer] T[employer]ie RN

## 2016-01-08 NOTE — Telephone Encounter (Signed)
Pt was on Eliquis 5mg  BID previously.    Spoke with Med Aide working with pt today and advised of recommendations.  She verbalized understanding and asked that order be faxed.  Fax number is [phone].  Will fax information to them.

## 2016-01-08 NOTE — Telephone Encounter (Signed)
Stop aspirin. Continue Apixiban.

## 2016-02-05 ENCOUNTER — Encounter: Payer: Self-pay | Admitting: Interventional Cardiology

## 2016-02-05 ENCOUNTER — Ambulatory Visit (INDEPENDENT_AMBULATORY_CARE_PROVIDER_SITE_OTHER): Payer: Medicare Other | Admitting: Interventional Cardiology

## 2016-02-05 VITALS — BP 140/66 | HR 83 | Ht 64.0 in | Wt 148.6 lb

## 2016-02-05 DIAGNOSIS — I482 Chronic atrial fibrillation, unspecified: Secondary | ICD-10-CM

## 2016-02-05 DIAGNOSIS — R413 Other amnesia: Secondary | ICD-10-CM | POA: Diagnosis not present

## 2016-02-05 DIAGNOSIS — I25810 Atherosclerosis of coronary artery bypass graft(s) without angina pectoris: Secondary | ICD-10-CM | POA: Diagnosis not present

## 2016-02-05 DIAGNOSIS — E7800 Pure hypercholesterolemia, unspecified: Secondary | ICD-10-CM

## 2016-02-05 DIAGNOSIS — Z7901 Long term (current) use of anticoagulants: Secondary | ICD-10-CM | POA: Insufficient documentation

## 2016-02-05 NOTE — Progress Notes (Signed)
Cardiology Office Note    Date:  02/05/2016   ID:  Yvonne GreavesBarbara S Claar, DOB 22-Nov-1933, MRN [id number]  PCP:  Ginette OttoSTONEKING,HAL THOMAS, MD  Cardiologist: Lesleigh NoeHenry W Maggard III, MD   Chief Complaint  Patient presents with  . Coronary Artery Disease    History of Present Illness:  Yvonne Butler is a 82 y.o. female  presents for  CAD, hypertension, chronic atrial fibrillation, and anticoagulation.   Mrs. Yvonne Butler is doing well. She looks to her husband to answer questions for her. She denies chest pain. No blood in urine or stool. She has no specific concerns.    Past Medical History:  Diagnosis Date  . CAD (coronary artery disease)    with CABG- Dr. Katrinka Butler  . Complication of anesthesia   . Coronary atherosclerosis of native coronary artery   . Diabetes (HCC)   . Diverticulosis   . Hypercholesteremia   . Hyperlipidemia   . Osteopenia   . PONV (postoperative nausea and vomiting)   . Vitamin D deficiency     Past Surgical History:  Procedure Laterality Date  . APPENDECTOMY    . CORONARY ARTERY BYPASS GRAFT      in with LIMA to LAD, SVG to the circumflex, SVG to the diagonal, and SVG to the PDA.  [name]. HIP ARTHROPLASTY Right 02/19/2013   Procedure: HEMI ARTHROPLASTY BIPOLAR HIP CEMENTED VS UNCEMENTED;  Surgeon: Harvie JuniorJohn L Graves, MD;  Location: [facility] OR;  Service: Orthopedics;  Laterality: Right;  . HIP SURGERY Right 02/20/2013   HEMI -BIPOLAR ARTHROPLASTY  . Ovarian Cyst & Fallopian Tubes      Current Medications: Outpatient Medications Prior to Visit  Medication Sig Dispense Refill  . apixaban (ELIQUIS) 5 MG TABS tablet Take 1 tablet (5 mg total) by mouth 2 (two) times daily. 60 tablet 11  . aspirin 81 MG tablet Take 81 mg by mouth daily.    [name]. atorvastatin (LIPITOR) 40 MG tablet TAKE 1 TABLET (40 MG TOTAL) BY MOUTH DAILY. 30 tablet 11  . busPIRone (BUSPAR) 15 MG tablet Take 15 mg by mouth 3 (three) times daily.    . Choline Fenofibrate (TRILIPIX) 45 MG capsule Take 45 mg by mouth daily.     [name]. donepezil (ARICEPT) 5 MG tablet Take 5 mg by mouth at bedtime.    . furosemide (LASIX) 20 MG tablet Take 1 tablet by mouth daily.    [name]. gabapentin (NEURONTIN) 100 MG capsule TAKE 1 CAPSULE EVERYDAY AT BEDTIME  5  . glimepiride (AMARYL) 1 MG tablet Take 1 mg by mouth daily with breakfast.     . metoprolol (LOPRESSOR) 25 MG tablet Take 1 tablet (25 mg total) by mouth 2 (two) times daily.    . Multiple Vitamin (MULTIVITAMIN) tablet Take 1 tablet by mouth daily.    [name]. NAMENDA XR 28 MG CP24 24 hr capsule TAKE 1 CAPSULE ONCE A DAY  6  . pravastatin (PRAVACHOL) 40 MG tablet Take 40 mg by mouth daily.    . traMADol (ULTRAM) 50 MG tablet Take 50 mg by mouth as needed for moderate pain. Patient unsure of frequency     No facility-administered medications prior to visit.      Allergies:   Patient has no known allergies.   Social History   Social History  . Marital status: Married    Spouse name: N/A  . Number of children: N/A  . Years of education: N/A   Social History Main Topics  . Smoking status: Former Smoker  Types: Cigarettes  . Smokeless tobacco: Never Used  . Alcohol use No  . Drug use: No  . Sexual activity: No   Other Topics Concern  . None   Social History Narrative  . None     Family History:  The patient's family history includes Heart disease in her father.   ROS:   Please see the history of present illness.    Cough and decreased hearing but otherwise unremarkable according to the patient. Decreased memory is a significant problem according to the husband.  All other systems reviewed and are negative.   PHYSICAL EXAM:   VS:  BP 140/66 (BP Location: Left Arm)   Pulse 83   Ht 5\' 4"  (1.626 m)   Wt 148 lb 9.6 oz (67.4 kg)   BMI 25.51 kg/m    GEN: Well nourished, well developed, in no acute distress  HEENT: normal  Neck: no JVD, carotid bruits, or masses Cardiac: IIRR; no murmurs, rubs, or gallops,no edema  Respiratory:  clear to auscultation bilaterally,  normal work of breathing GI: soft, nontender, nondistended, + BS MS: no deformity or atrophy  Skin: warm and dry, no rash Neuro:  Alert and Oriented x 3, Strength and sensation are intact. Psych: She is lost and conversation. She continually looks to her husband asked her questions. She is calm.  Wt Readings from Last 3 Encounters:  02/05/16 148 lb 9.6 oz (67.4 kg)  01/07/16 154 lb (69.9 kg)  05/01/15 154 lb 12.8 oz (70.2 kg)      Studies/Labs Reviewed:   EKG:  EKG  Not repeated  Recent Labs: 03/27/2015: BUN 16; Creat 1.24; Hemoglobin 13.8; Platelets 361; Potassium 4.6; Sodium 143   Lipid Panel    Component Value Date/Time   CHOL 216 (H) 07/19/2014 0828   TRIG 105.0 07/19/2014 0828   HDL 74.40 07/19/2014 0828   CHOLHDL 3 07/19/2014 0828   VLDL 21.0 07/19/2014 0828   LDLCALC 121 (H) 07/19/2014 0828    Additional studies/ records that were reviewed today include:  No new data.    ASSESSMENT:    1. Coronary atherosclerosis of autologous vein bypass graft without angina   2. Chronic atrial fibrillation (HCC)   3. Chronic anticoagulation   4. Memory loss   5. Pure hypercholesterolemia      PLAN:  In order of problems listed above:  1. Asymptomatic with reference to coronary artery disease and ischemic symptoms. 2. Control rate and asymptomatic. This problem is clinically silent. 3. No bleeding complications. We discussed taking caution to avoid falls and losing balance. 4. This is her major problem. Her husband is increasingly speaking for her and    Medication Adjustments/Labs and Tests Ordered: Current medicines are reviewed at length with the patient today.  Concerns regarding medicines are outlined above.  Medication changes, Labs and Tests ordered today are listed in the Patient Instructions below. Patient Instructions  Medication Instructions:  None  Labwork: None  Testing/Procedures: None  Follow-Up: Your physician wants you to follow-up in: 9-12  months with Dr. Katrinka Butler.  You will receive a reminder letter in the mail two months in advance. If you don't receive a letter, please call our office to schedule the follow-up appointment.   Any Other Special Instructions Will Be Listed Below (If Applicable).     If you need a refill on your cardiac medications before your next appointment, please call your pharmacy.      Signed, Lesleigh NoeHenry W Foell III, MD  02/05/2016 4:09 PM  [facility] [place], [place], [place]  [address] Phone: [phone]; Fax: [phone]

## 2016-02-05 NOTE — Patient Instructions (Signed)
Medication Instructions:  None  Labwork: None  Testing/Procedures: None  Follow-Up: Your physician wants you to follow-up in: 9-12 months with Dr. Rotenberg.  You will receive a reminder letter in the mail two months in advance. If you don't receive a letter, please call our office to schedule the follow-up appointment.   Any Other Special Instructions Will Be Listed Below (If Applicable).     If you need a refill on your cardiac medications before your next appointment, please call your pharmacy.   

## 2016-02-11 ENCOUNTER — Telehealth: Payer: Self-pay | Admitting: Interventional Cardiology

## 2016-02-11 NOTE — Telephone Encounter (Signed)
Pt's husband states Dr. [name] asked him to call back with medication clarification on buspirone and Aricept.  He reports Buspar is 5 mg BID and Aricept 10 mg QHS.

## 2016-02-11 NOTE — Telephone Encounter (Signed)
New message      Message left on my voice mail regarding pts medication.  Not sure what he was trying to say.  Please call

## 2016-02-12 ENCOUNTER — Other Ambulatory Visit: Payer: Self-pay | Admitting: *Deleted

## 2016-02-12 NOTE — Telephone Encounter (Signed)
Meds updated on pt's list.

## 2016-03-31 ENCOUNTER — Other Ambulatory Visit: Payer: Self-pay | Admitting: Interventional Cardiology

## 2016-05-01 ENCOUNTER — Emergency Department: Admit: 2016-05-01 | Payer: MEDICARE | Primary: Internal Medicine

## 2016-05-01 ENCOUNTER — Inpatient Hospital Stay
Admit: 2016-05-01 | Discharge: 2016-05-01 | Disposition: A | Discharge status: Home / Self Care | Payer: MEDICARE | Attending: Emergency Medicine

## 2016-05-01 DIAGNOSIS — S0990XA Unspecified injury of head, initial encounter: Secondary | ICD-10-CM

## 2016-05-01 MED ORDER — ACETAMINOPHEN 325 MG TABLET
325 mg | ORAL | Status: AC
Start: 2016-05-01 — End: 2016-05-01
  Administered 2016-05-01: 21:00:00 via ORAL

## 2016-05-01 MED FILL — ACETAMINOPHEN 325 MG TABLET: 325 mg | ORAL | Qty: 3

## 2016-05-01 NOTE — ED Provider Notes (Signed)
[facility]  Emergency Department Treatment Report        Patient: [name] [name] Age: 83 y.o. Sex: female    Date of Birth: 04-16-1933 Admit Date: 05/01/2016 PCP: Gracy Racer, MD   MRN: [id number]  CSN: [id number]  Attending: Dr. Gaynell Face    Room: H02/H02 Time Dictated: 6:42 PM [id number]       Chief Complaint   Fall    History of Present Illness   83 y.o. female states that she was about to come [place] the [place] when she fell back hitting her head at 11:45 AM.  Patient denies any dizziness prior to the fall, she denies any LOC.  She presents complaining of a bump on the back of her head, soreness, rates it as a 5 on a scale 1-10, constant since the fall, not associated with any nausea vomiting.  She denies any other injuries.    Review of Systems   Review of Systems   Constitutional: Negative for diaphoresis and fever.   HENT: Negative for nosebleeds and sore throat.    Eyes: Negative for blurred vision.   Respiratory: Negative for cough and shortness of breath.    Cardiovascular: Negative for chest pain and palpitations.        Chronic leg swelling   Gastrointestinal: Negative for abdominal pain and vomiting.   Genitourinary: Negative for dysuria and frequency.   Musculoskeletal: Negative for back pain and neck pain.   Skin: Negative for itching.        No laceration   Neurological: Negative for speech change, seizures and loss of consciousness.        Positive headache injury       Past Medical/Surgical History     Past Medical History:   Diagnosis Date   ??? Arthritis    ??? Endocrine disease     hypothyroid   ??? Hemochromatosis    ??? Hypertension      Past Surgical History:   Procedure Laterality Date   ??? HX ORTHOPAEDIC      left knee replacement   Patient states history of irregular rhythm    Social History     Social History     Social History   ??? Marital status: WIDOWED     Spouse name: N/A   ??? Number of children: N/A   ??? Years of education: N/A     Occupational History    ??? Not on file.     Social History Main Topics   ??? Smoking status: Never Smoker   ??? Smokeless tobacco: Never Used   ??? Alcohol use No   ??? Drug use: No   ??? Sexual activity: Not on file     Other Topics Concern   ??? Not on file     Social History Narrative       Family History   Positive for hypertension    Current Medications     Prior to Admission Medications   Prescriptions Last Dose Informant Patient Reported? Taking?   HYDROcodone-acetaminophen (NORCO) 5-325 mg per tablet   No No   Sig: Take 1 Tab by mouth every four (4) hours as needed for Pain. Max Daily Amount: 6 Tabs.   levothyroxine (SYNTHROID) 125 mcg tablet   Yes No   Sig: Take  by mouth Daily (before breakfast).   lisinopril (PRINIVIL, ZESTRIL) 10 mg tablet   Yes No   Sig: Take  by mouth daily.   predniSONE (DELTASONE) 10 mg  tablet   Yes No   Sig: Take  by mouth daily (with breakfast).      Facility-Administered Medications: None       Allergies     Allergies   Allergen Reactions   ??? Shellfish Derived Hives       Physical Exam   ED Triage Vitals   ED Encounter Vitals Group      BP 05/01/16 1333 186/78      Pulse (Heart Rate) 05/01/16 1333 74      Resp Rate 05/01/16 1333 16      Temp 05/01/16 1333 98.8 ??F (37.1 ??C)      Temp src --       O2 Sat (%) 05/01/16 1333 97 %      Weight 05/01/16 1333 155 lb      Height 05/01/16 1333 5\' 3"      Physical Exam   Constitutional: She is oriented to person, place, and time.   Constitutional:General appearance:  Patient appears well developed and well nourished.     HENT:   Hematoma occipital scalp, no bony crepitus   Eyes: Conjunctivae and EOM are normal. Pupils are equal, round, and reactive to light.   Neck:   Slight tenderness noted mid cervical spine and cervical paraspinal muscles   Cardiovascular: Normal rate and normal heart sounds.    No murmur heard.  Slightly irregular rhythm   Pulmonary/Chest: Effort normal. No stridor. No respiratory distress. She has no wheezes.    Abdominal: Soft. She exhibits no distension and no mass. There is no tenderness. There is no rebound and no guarding.   Musculoskeletal:   No tenderness upper or lower extremities, no complaints of pain with range of motion or palpation, no obvious deformity   Neurological: She is alert and oriented to person, place, and time.   Equal strength bilateral upper and lower extremities  Was able to ambulate to the restroom, no ataxia   Skin: Skin is warm and dry. No rash noted.   Vitals reviewed.       Impression and Management Plan   This is a elderly patient with head injury with her age we will obtain CT of the head to rule out subdural as well as cervical spine CT, all other injuries noted and patient has not been ill, it was a mechanical fall and do not see need for any additional testing    Diagnostic Studies   Ct Head Wo Cont    Result Date: 05/01/2016  Examination: CT brain noncontrast Clinical indication: Headache, acute trauma, Comparison: None Findings: Multiple contiguous axial CT images of the brain were obtained from the vertex of the skull to the skull base with no contrast. Paranasal sinuses well aerated.  Mastoids, normal. No hemorrhage, masses, or midline shift. Ventricular system normal. No extra-axial fluid collections. No hydrocephalus.  Craniocervical junction is intact. Moderate periventricular areas of low attenuation. Mild frontotemporal atrophy. Subinsular low densities. Osseous structures intact. Skullbase and orbits grossly intact. Left parietal scalp acute hematoma 16 x 11 mm.     Impression: Chronic microvascular ischemic changes.  Acute left parietal scalp hematoma.     Ct Spine Cerv Wo Cont    Result Date: 05/01/2016  EXAMINATION: CT SPINE CERV WO CONT CLINICAL INDICATION: neck pain s/p fall COMPARISON:  None. TECHNIQUE: Multiple contiguous axial CT images of the cervical spine were obtained from the skull base to the thoracic inlet  with reconstruction in the coronal and sagittal planes. FINDINGS: No prevertebral soft tissue  swelling. C2-3 disc osteophyte complex. No stenosis. C3-4 disc osteophyte complex. No focal stenosis. Very slight retrolisthesis. C4-5 disc osteophyte complex. Moderate left facet hypertrophy. No stenosis. C5-6 disc osteophyte complex. Moderate right neural foraminal stenosis. Mild central stenosis. C6-7 disc osteophyte complex. Mild left neural foraminal stenosis. C7-T1 mild spurring. No focal stenosis. No fracture. Lamina intact.     IMPRESSION:  1. Disc osteophyte complex CT 2-3 through C6-7. Moderate right neuroforaminal stenosis at C5-6 2. No fracture.       ED Course   Patient was able to the ED and advised of her CT finding  Medications   acetaminophen (TYLENOL) tablet 975 mg (975 mg Oral Given 05/01/16 1536)     Medical Decision Making   None came to the ED and will take patient home, patient having no concussive symptoms or need for hospitalization and denies being on blood thinners    Final Diagnosis       ICD-10-CM ICD-9-CM   1. Injury of head, initial encounter S09.90XA 959.01   2. Hematoma of scalp, initial encounter S00.03XA 920   3. Fall, initial encounter W19.Lorne Skeens Z610.9       Disposition     Discharge Medication List as of 05/01/2016  4:27 PM        The patient was personally evaluated by myself and Dr. Gaynell Face who agrees with the above assessment and plan.    Farrel Demark, PA-C  May 01, 2016    My signature above authenticates this document and my orders, the final ??  diagnosis (es), discharge prescription (s), and instructions in the Epic ??  record.  If you have any questions please contact [phone].  ??  Nursing notes have been reviewed by the physician/ advanced practice ??  Clinician.    Dragon medical dictation software was used for portions of this report. Unintended voice recognition errors may occur.

## 2016-05-01 NOTE — ED Notes (Signed)
Pt does have a history of hemochromatosis, with last treatment recently.  PT receives treatment every "6 months".  Pt denies LOC. Abrasion, with no currently bleeding noted. Ice pack applied.  Pt denies n/v/dizzy, blurry vision, double vision.

## 2016-05-01 NOTE — ED Notes (Signed)
Discharge instructions to patient, patient verbalizes understanding.

## 2016-05-01 NOTE — ED Triage Notes (Signed)
Pt states fell backwards and hit her head while coming out of the bank, denies LOC or blood thinners.  Pt noted with hematoma to back of head.

## 2016-12-01 ENCOUNTER — Encounter (INDEPENDENT_AMBULATORY_CARE_PROVIDER_SITE_OTHER): Payer: Self-pay

## 2016-12-01 ENCOUNTER — Encounter: Payer: Self-pay | Admitting: Interventional Cardiology

## 2016-12-01 ENCOUNTER — Ambulatory Visit (INDEPENDENT_AMBULATORY_CARE_PROVIDER_SITE_OTHER): Payer: Medicare Other | Admitting: Interventional Cardiology

## 2016-12-01 VITALS — BP 132/80 | HR 82 | Ht 65.0 in | Wt 142.0 lb

## 2016-12-01 DIAGNOSIS — I482 Chronic atrial fibrillation, unspecified: Secondary | ICD-10-CM

## 2016-12-01 DIAGNOSIS — I25810 Atherosclerosis of coronary artery bypass graft(s) without angina pectoris: Secondary | ICD-10-CM

## 2016-12-01 DIAGNOSIS — E7800 Pure hypercholesterolemia, unspecified: Secondary | ICD-10-CM | POA: Diagnosis not present

## 2016-12-01 DIAGNOSIS — Z7901 Long term (current) use of anticoagulants: Secondary | ICD-10-CM

## 2016-12-01 DIAGNOSIS — R413 Other amnesia: Secondary | ICD-10-CM

## 2016-12-01 NOTE — Progress Notes (Signed)
Cardiology Office Note    Date:  12/01/2016   ID:  [name], DOB 05/01/1933, MRN [id number]  PCP:  Merlene Laughter, MD  Cardiologist: Lesleigh Noe, MD   Chief Complaint  Patient presents with  . Coronary Artery Disease    History of Present Illness:  Yvonne Butler is a 83 y.o. female presents for  CADWith bypass grafting in 2000 (including a LIMA to LAD, SVG to diagonal, SVG to OM, SVG to PDA), hypertension, chronic atrial fibrillation, and anticoagulation.  Currently residing in a nursing home because of dementia and difficulty with managing the patient at home. No apparent difficulties at the nursing home. Appetite is been good. Weight is been stable. No lower extremity swelling. No complaint of chest pain or dyspnea.   Past Medical History:  Diagnosis Date  . CAD (coronary artery disease)    with CABG- Dr. Katrinka Blazing  . Complication of anesthesia   . Coronary atherosclerosis of native coronary artery   . Diabetes (HCC)   . Diverticulosis   . Hypercholesteremia   . Hyperlipidemia   . Osteopenia   . PONV (postoperative nausea and vomiting)   . Vitamin D deficiency     Past Surgical History:  Procedure Laterality Date  . APPENDECTOMY    . CORONARY ARTERY BYPASS GRAFT      in with LIMA to LAD, SVG to the circumflex, SVG to the diagonal, and SVG to the PDA.  [name] HIP ARTHROPLASTY Right 02/19/2013   Procedure: HEMI ARTHROPLASTY BIPOLAR HIP CEMENTED VS UNCEMENTED;  Surgeon: Harvie Junior, MD;  Location: [facility] OR;  Service: Orthopedics;  Laterality: Right;  . HIP SURGERY Right 02/20/2013   HEMI -BIPOLAR ARTHROPLASTY  . Ovarian Cyst & Fallopian Tubes      Current Medications: Outpatient Medications Prior to Visit  Medication Sig Dispense Refill  . apixaban (ELIQUIS) 5 MG TABS tablet Take 1 tablet (5 mg total) by mouth 2 (two) times daily. 60 tablet 11  . Choline Fenofibrate (TRILIPIX) 45 MG capsule Take 45 mg by mouth daily.    [name] donepezil (ARICEPT) 10 MG tablet Take  1 tablet (10 mg total) by mouth at bedtime. 30 tablet 0  . furosemide (LASIX) 20 MG tablet Take 1 tablet by mouth daily.    [name] gabapentin (NEURONTIN) 100 MG capsule TAKE 1 CAPSULE EVERYDAY AT BEDTIME  5  . metoprolol (LOPRESSOR) 25 MG tablet Take 1 tablet (25 mg total) by mouth 2 (two) times daily.    . Multiple Vitamin (MULTIVITAMIN) tablet Take 1 tablet by mouth daily.    [name] NAMENDA XR 28 MG CP24 24 hr capsule TAKE 1 CAPSULE ONCE A DAY  6  . pravastatin (PRAVACHOL) 40 MG tablet Take 40 mg by mouth daily.    [name] aspirin 81 MG tablet Take 81 mg by mouth daily.    [name] atorvastatin (LIPITOR) 40 MG tablet TAKE 1 TABLET (40 MG TOTAL) BY MOUTH DAILY. 30 tablet 6  . busPIRone (BUSPAR) 5 MG tablet Take 1 tablet (5 mg total) by mouth. 30 tablet 0  . glimepiride (AMARYL) 1 MG tablet Take 1 mg by mouth daily with breakfast.     . traMADol (ULTRAM) 50 MG tablet Take 50 mg by mouth as needed for moderate pain. Patient unsure of frequency     No facility-administered medications prior to visit.      Allergies:   Patient has no known allergies.   Social History   Social History  . Marital status:  Married    Spouse name: N/A  . Number of children: N/A  . Years of education: N/A   Social History Main Topics  . Smoking status: Former Smoker    Types: Cigarettes  . Smokeless tobacco: Never Used  . Alcohol use No  . Drug use: No  . Sexual activity: No   Other Topics Concern  . None   Social History Narrative  . None     Family History:  The patient's family history includes Heart disease in her father.   ROS:   Please see the history of present illness.    Significant itching. History of scabies of the nursing home. Her husband accompanies her today. She is not able to communicate in an appropriate manner during conversation. Conversation is very tangential and off subjective.  All other systems reviewed and are negative.   PHYSICAL EXAM:   VS:  BP 132/80 (BP Location: Left Arm)   Pulse 82    Ht  (1.651 m)   Wt 142 lb (64.4 kg)   BMI 23.63 kg/m    GEN: Well nourished, well developed, in no acute distress  HEENT: normal  Neck: no JVD, carotid bruits, or masses Cardiac: IIRR; no murmurs, rubs, or gallops,no edema  Respiratory:  clear to auscultation bilaterally, normal work of breathing GI: soft, nontender, nondistended, + BS MS: no deformity or atrophy  Skin: warm and dry, no rash Neuro:  Alert and Oriented x 3, Strength and sensation are intact Psych: euthymic mood, full affect  Wt Readings from Last 3 Encounters:  12/01/16 142 lb (64.4 kg)  02/05/16 148 lb 9.6 oz (67.4 kg)  01/07/16 154 lb (69.9 kg)      Studies/Labs Reviewed:   EKG:  EKG  Atrial fibrillation with controlled rate at 82 bpm. Ashman's phenomenon. ST-T wave abnormality. No Q waves evidence of intercurrent infarction since the most recent prior EKG of October 2017.  Recent Labs: No results found for requested labs within last 8760 hours.   Lipid Panel    Component Value Date/Time   CHOL 216 (H) 07/19/2014 0828   TRIG 105.0 07/19/2014 0828   HDL 74.40 07/19/2014 0828   CHOLHDL 3 07/19/2014 0828   VLDL 21.0 07/19/2014 0828   LDLCALC 121 (H) 07/19/2014 0828    Additional studies/ records that were reviewed today include:  None    ASSESSMENT:    1. Coronary atherosclerosis of autologous vein bypass graft without angina   2. Chronic atrial fibrillation (HCC)   3. Pure hypercholesterolemia   4. Chronic anticoagulation   5. Memory loss      PLAN:  In order of problems listed above:  1. No symptoms to suggest angina or anginal equivalent. 2. Controlled rate on the current medical regimen. No change in medical regimen. 3. Not addressed. 4. No bleeding on the current medical regimen. Continue same 5. Severe and with communication deficit.   Unfortunately, her most significant diagnosis is dementia. Cardiac status is stable. No functional testing required. Clinical follow-up in  one year or earlier if develops problems.    Medication Adjustments/Labs and Tests Ordered: Current medicines are reviewed at length with the patient today.  Concerns regarding medicines are outlined above.  Medication changes, Labs and Tests ordered today are listed in the Patient Instructions below. Patient Instructions  Medication Instructions:  None  Labwork: None  Testing/Procedures: None  Follow-Up: Your physician wants you to follow-up in: 1 year with Dr. Katrinka Blazing.  You will receive a reminder letter  in the mail two months in advance. If you don't receive a letter, please call our office to schedule the follow-up appointment.   Any Other Special Instructions Will Be Listed Below (If Applicable).     If you need a refill on your cardiac medications before your next appointment, please call your pharmacy.      Signed, Lesleigh Noe, MD  12/01/2016 1:09 PM    [facility] [address] [place], [place], [place]  [address] Phone: [phone]; Fax: [phone]

## 2016-12-01 NOTE — Patient Instructions (Signed)
Medication Instructions:  None  Labwork: None  Testing/Procedures: None  Follow-Up: Your physician wants you to follow-up in: 1 year with Dr. Memmer.  You will receive a reminder letter in the mail two months in advance. If you don't receive a letter, please call our office to schedule the follow-up appointment.   Any Other Special Instructions Will Be Listed Below (If Applicable).     If you need a refill on your cardiac medications before your next appointment, please call your pharmacy.   

## 2016-12-02 ENCOUNTER — Emergency Department (HOSPITAL_COMMUNITY)
Admission: EM | Admit: 2016-12-02 | Discharge: 2016-12-02 | Disposition: A | Discharge status: Home / Self Care | Payer: Medicare Other | Attending: Emergency Medicine | Admitting: Emergency Medicine

## 2016-12-02 ENCOUNTER — Encounter (HOSPITAL_COMMUNITY): Payer: Self-pay | Admitting: Emergency Medicine

## 2016-12-02 DIAGNOSIS — R220 Localized swelling, mass and lump, head: Secondary | ICD-10-CM | POA: Diagnosis present

## 2016-12-02 DIAGNOSIS — I2510 Atherosclerotic heart disease of native coronary artery without angina pectoris: Secondary | ICD-10-CM | POA: Diagnosis not present

## 2016-12-02 DIAGNOSIS — I4891 Unspecified atrial fibrillation: Secondary | ICD-10-CM | POA: Insufficient documentation

## 2016-12-02 DIAGNOSIS — Z79899 Other long term (current) drug therapy: Secondary | ICD-10-CM | POA: Insufficient documentation

## 2016-12-02 DIAGNOSIS — Z7901 Long term (current) use of anticoagulants: Secondary | ICD-10-CM | POA: Insufficient documentation

## 2016-12-02 DIAGNOSIS — I10 Essential (primary) hypertension: Secondary | ICD-10-CM | POA: Insufficient documentation

## 2016-12-02 DIAGNOSIS — W19XXXA Unspecified fall, initial encounter: Secondary | ICD-10-CM

## 2016-12-02 DIAGNOSIS — Z9181 History of falling: Secondary | ICD-10-CM | POA: Insufficient documentation

## 2016-12-02 DIAGNOSIS — Z87891 Personal history of nicotine dependence: Secondary | ICD-10-CM | POA: Insufficient documentation

## 2016-12-02 DIAGNOSIS — F0390 Unspecified dementia without behavioral disturbance: Secondary | ICD-10-CM | POA: Insufficient documentation

## 2016-12-02 NOTE — ED Provider Notes (Signed)
[facility]-EMERGENCY DEPT Provider Note   CSN: [id number] Arrival date & time: 12/02/16  0014     History   Chief Complaint Chief Complaint  Patient presents with  . Fall    HPI Yvonne Butler is a 83 y.o. female.  HPI  This is an 83 year old female with a history of coronary artery disease, hypertension, hyperlipidemia, dementia who presents following an unwitnessed fall. Her family is at the bedside and provides most of the history. They got her call that the patient had fallen. The nursing facility noted a lump on the left side of her head. Her husband states that that "lump" has been there for some time. They report that the patient is at her baseline. Patient has no complaints but is generally a poor historian and unable to provide any history.she is on Eliquis for atrial fibrillation.  Level V caveat for dementia.  Past Medical History:  Diagnosis Date  . CAD (coronary artery disease)    with CABG- Dr. Katrinka Blazing  . Complication of anesthesia   . Coronary atherosclerosis of native coronary artery   . Diabetes (HCC)   . Diverticulosis   . Hypercholesteremia   . Hyperlipidemia   . Osteopenia   . PONV (postoperative nausea and vomiting)   . Vitamin D deficiency     Patient Active Problem List   Diagnosis Date Noted  . Chronic anticoagulation 02/05/2016  . Atrial fibrillation (HCC) 03/27/2015  . Other bilateral secondary osteoarthritis of knee 02/21/2013  . Femoral neck fracture (HCC) 02/19/2013  . Leukocytosis 02/19/2013  . Memory loss 02/19/2013  . Fracture of femoral neck, right (HCC) 02/19/2013  . Coronary atherosclerosis of autologous vein bypass graft 01/03/2013    Class: Diagnosis of  . HLD (hyperlipidemia) 01/03/2013    Class: Chronic  . DM ketoacidosis type II, uncontrolled (HCC) 01/03/2013    Class: Chronic    Past Surgical History:  Procedure Laterality Date  . APPENDECTOMY    . CORONARY ARTERY BYPASS GRAFT      in with LIMA to LAD, SVG to the  circumflex, SVG to the diagonal, and SVG to the PDA.  [name] HIP ARTHROPLASTY Right 02/19/2013   Procedure: HEMI ARTHROPLASTY BIPOLAR HIP CEMENTED VS UNCEMENTED;  Surgeon: Harvie Junior, MD;  Location: [facility] OR;  Service: Orthopedics;  Laterality: Right;  . HIP SURGERY Right 02/20/2013   HEMI -BIPOLAR ARTHROPLASTY  . Ovarian Cyst & Fallopian Tubes      OB History    No data available       Home Medications    Prior to Admission medications   Medication Sig Start Date End Date Taking? Authorizing Provider  amoxicillin (AMOXIL) 500 MG tablet Take 500 mg by mouth as needed (prior to dental procedures).    [provider]  apixaban (ELIQUIS) 5 MG TABS tablet Take 1 tablet (5 mg total) by mouth 2 (two) times daily. 03/29/15   Lyn Records, MD  busPIRone (BUSPAR) 10 MG tablet Take 10 mg by mouth 2 (two) times daily. 11/22/16   [provider]  cetirizine (ZYRTEC) 10 MG tablet Take 5 mg by mouth daily.    [provider]  Choline Fenofibrate (TRILIPIX) 45 MG capsule Take 45 mg by mouth daily.    [provider]  donepezil (ARICEPT) 10 MG tablet Take 1 tablet (10 mg total) by mouth at bedtime. 02/12/16   Lyn Records, MD  furosemide (LASIX) 20 MG tablet Take 1 tablet by mouth daily. 03/25/15   [provider]  gabapentin (NEURONTIN) 100 MG capsule TAKE 1 CAPSULE EVERYDAY AT BEDTIME 02/03/15   [provider]  hydrOXYzine (ATARAX/VISTARIL) 10 MG tablet Take 10 mg by mouth 3 (three) times daily. 11/20/16   [provider]  ivermectin (STROMECTOL) 3 MG TABS tablet Take as directed. 11/25/16   [provider]  lisinopril (PRINIVIL,ZESTRIL) 5 MG tablet Take 5 mg by mouth daily. 11/24/16   [provider]  metoprolol (LOPRESSOR) 25 MG tablet Take 1 tablet (25 mg total) by mouth 2 (two) times daily. 04/18/15   Lyn Records, MD  Multiple Vitamin (MULTIVITAMIN) tablet Take 1 tablet by mouth daily.    [provider]  NAMENDA XR  28 MG CP24 24 hr capsule TAKE 1 CAPSULE ONCE A DAY 01/09/15   [provider]  permethrin (ELIMITE) 5 % cream Used as directed. 11/25/16   [provider]  pravastatin (PRAVACHOL) 20 MG tablet Take 20 mg by mouth daily. 11/19/16   [provider]  pravastatin (PRAVACHOL) 40 MG tablet Take 40 mg by mouth daily.    [provider]  traMADol-acetaminophen (ULTRACET) 37.5-325 MG tablet Take 1 tablet by mouth 2 (two) times daily. 11/24/16   [provider]    Family History Family History  Problem Relation Age of Onset  . Heart disease Father     Social History Social History  Substance Use Topics  . Smoking status: Former Smoker    Types: Cigarettes  . Smokeless tobacco: Never Used  . Alcohol use No     Allergies   Patient has no known allergies.   Review of Systems Review of Systems  Unable to perform ROS: Dementia     Physical Exam Updated Vital Signs BP (!) 162/74 (BP Location: Right Arm)   Pulse 88   Temp 98.1 F (36.7 C) (Oral)   Resp 14   SpO2 97%   Physical Exam  Constitutional: She appears well-developed and well-nourished. No distress.  elderly  HENT:  Head: Normocephalic and atraumatic.  Nodule noted to the left scalp approximately dime-sized, no associated hematoma  Eyes: Pupils are equal, round, and reactive to light.  Slight anisocoria noted, both pupils reactive  Neck:  No midline C-spine tenderness, step-off, or deformity  Cardiovascular: Normal rate and normal heart sounds.   Irregular rhythm  Pulmonary/Chest: Effort normal and breath sounds normal. No respiratory distress. She has no wheezes.  Abdominal: Soft. There is no tenderness.  Musculoskeletal: Normal range of motion. She exhibits no edema or deformity.  Normal range of motion bilateral hips and knees, no obvious pain or tenderness  Neurological: She is alert.  Disoriented, answers most questions with yes.  Skin: Skin is warm and dry.  Nursing  note and vitals reviewed.    ED Treatments / Results  Labs (all labs ordered are listed, but only abnormal results are displayed) Labs Reviewed  URINALYSIS, ROUTINE W REFLEX MICROSCOPIC    EKG  EKG Interpretation  Date/Time:  Wednesday December 02 2016 00:24:53 EDT Ventricular Rate:  91 PR Interval:    QRS Duration: 87 QT Interval:  360 QTC Calculation: 443 R Axis:   64 Text Interpretation:  Atrial fibrillation Diffuse ST changes Confirmed by Ross Marcus ([id number]) on 12/02/2016 12:32:27 AM       Radiology No results found.  Procedures Procedures (including critical care time)  Medications Ordered in ED Medications - No data to display   Initial Impression / Assessment and Plan / ED Course  I have reviewed the  triage vital signs and the nursing notes.  Pertinent labs & imaging results that were available during my care of the patient were reviewed by me and considered in my medical decision making (see chart for details).     Patient presents following a fall. Fall unwitnessed. Per her family she is at her baseline.she does take blood thinners.no obvious new injuries. Husband reports nodule has been present. She is on blood thinners. I discussed with the family patient's goals of care. She is DO NOT RESUSCITATE. They requested minimal workup as they would not want aggressive treatment for any intracranial injuries.  I feel this is appropriate and consistent with the patient's goals of care. She appears to be her baseline without any obvious new injuries. However, I did discuss with the family she is at increased risk given her anticoagulation. Recommend discussion with primary physician regarding risk and benefits of continued anticoagulation given her goals of care.  After history, exam, and medical workup I feel the patient has been appropriately medically screened and is safe for discharge home. Pertinent diagnoses were discussed with the patient. Patient was given  return precautions.   Final Clinical Impressions(s) / ED Diagnoses   Final diagnoses:  Fall, initial encounter    New Prescriptions New Prescriptions   No medications on file     Shon Baton, MD 12/02/16 [phone]

## 2016-12-02 NOTE — Discharge Instructions (Signed)
You were seen today for a fall. Given her goals of care, minimal workup was done. If she develops changes in mental status or any new or worsening symptoms she should be reevaluated. Also discussed with her primary physician the risk and benefits of continued anticoagulation.

## 2016-12-02 NOTE — ED Triage Notes (Signed)
Per EMS pt is from [facility].  Unwitnessed fall in the bathroom.  Staff allowed pt to ambulate to common room to wait for EMS.  Reported hematoma to top of head.  On arrival of pt's husband he states the knot on her head is not new.  Pt is on Eliquis.  Alert to self only at baseline.  History of dementia.

## 2016-12-15 ENCOUNTER — Ambulatory Visit (INDEPENDENT_AMBULATORY_CARE_PROVIDER_SITE_OTHER): Payer: Medicare Other | Admitting: Sports Medicine

## 2016-12-15 ENCOUNTER — Encounter: Payer: Self-pay | Admitting: Sports Medicine

## 2016-12-15 DIAGNOSIS — L608 Other nail disorders: Secondary | ICD-10-CM

## 2016-12-15 DIAGNOSIS — M79675 Pain in left toe(s): Secondary | ICD-10-CM

## 2016-12-15 DIAGNOSIS — M79674 Pain in right toe(s): Secondary | ICD-10-CM

## 2016-12-15 DIAGNOSIS — F0150 Vascular dementia without behavioral disturbance: Secondary | ICD-10-CM

## 2016-12-15 DIAGNOSIS — B351 Tinea unguium: Secondary | ICD-10-CM

## 2016-12-15 NOTE — Progress Notes (Signed)
Subjective: Yvonne Butler is a 83 y.o. female patient seen today in office with complaint of painful thickened and elongated toenails; unable to trim. Patient is assisted by family member who reports that she has pain in toes, has difficulty with trimming and nails ingrow with some pain in right 1st toe . Patient has no other pedal complaints at this time.   Patient Active Problem List   Diagnosis Date Noted  . Chronic anticoagulation 02/05/2016  . Atrial fibrillation (HCC) 03/27/2015  . Other bilateral secondary osteoarthritis of knee 02/21/2013  . Femoral neck fracture (HCC) 02/19/2013  . Leukocytosis 02/19/2013  . Memory loss 02/19/2013  . Fracture of femoral neck, right (HCC) 02/19/2013  . Coronary atherosclerosis of autologous vein bypass graft 01/03/2013    Class: Diagnosis of  . HLD (hyperlipidemia) 01/03/2013    Class: Chronic  . DM ketoacidosis type II, uncontrolled (HCC) 01/03/2013    Class: Chronic    Current Outpatient Prescriptions on File Prior to Visit  Medication Sig Dispense Refill  . amoxicillin (AMOXIL) 500 MG tablet Take 500 mg by mouth as needed (prior to dental procedures).    [name] apixaban (ELIQUIS) 5 MG TABS tablet Take 1 tablet (5 mg total) by mouth 2 (two) times daily. 60 tablet 11  . busPIRone (BUSPAR) 10 MG tablet Take 10 mg by mouth 2 (two) times daily.    . cetirizine (ZYRTEC) 10 MG tablet Take 5 mg by mouth daily.    . Choline Fenofibrate (TRILIPIX) 45 MG capsule Take 45 mg by mouth daily.    [name] donepezil (ARICEPT) 10 MG tablet Take 1 tablet (10 mg total) by mouth at bedtime. 30 tablet 0  . furosemide (LASIX) 20 MG tablet Take 1 tablet by mouth daily.    [name] gabapentin (NEURONTIN) 100 MG capsule TAKE 1 CAPSULE EVERYDAY AT BEDTIME  5  . hydrOXYzine (ATARAX/VISTARIL) 10 MG tablet Take 10 mg by mouth 3 (three) times daily.    [name] ivermectin (STROMECTOL) 3 MG TABS tablet Take as directed.    . metoprolol (LOPRESSOR) 25 MG tablet Take 1 tablet (25 mg total) by  mouth 2 (two) times daily.    . Multiple Vitamin (MULTIVITAMIN) tablet Take 1 tablet by mouth daily.    [name] NAMENDA XR 28 MG CP24 24 hr capsule TAKE 1 CAPSULE ONCE A DAY  6  . permethrin (ELIMITE) 5 % cream Used as directed.    . pravastatin (PRAVACHOL) 20 MG tablet Take 20 mg by mouth daily.    . pravastatin (PRAVACHOL) 40 MG tablet Take 40 mg by mouth daily.    . traMADol-acetaminophen (ULTRACET) 37.5-325 MG tablet Take 1 tablet by mouth 2 (two) times daily.     No current facility-administered medications on file prior to visit.     No Known Allergies  Objective: Physical Exam  General: Well developed, nourished, no acute distress, awake, alert and oriented x 3  Vascular: Dorsalis pedis artery 1/4 bilateral, Posterior tibial artery 1/4 bilateral, skin temperature warm to warm proximal to distal bilateral lower extremities, mild varicosities, pedal hair present bilateral.  Neurological: Unable to discern due to patient status   Dermatological: Skin is warm, dry, and supple bilateral, Nails 1-10 are tender, long, thick, and discolored with mild subungal debris with incurvation without infection, no webspace macerations present bilateral, no open lesions present bilateral, no callus/corns/hyperkeratotic tissue present bilateral. No signs of infection bilateral.  Musculoskeletal: No symptomatic boney deformities noted bilateral. Muscular strength within normal limits without painon range of motion  using walker for stability. No pain with calf compression bilateral.  Assessment and Plan:  Problem List Items Addressed This Visit    None    Visit Diagnoses    Incurvated nail    -  Primary   Pain due to onychomycosis of toenails of both feet       Vascular dementia without behavioral disturbance          -Examined patient.  -Discussed treatment options for painful mycotic nails. -Mechanically debrided and reduced mycotic nails with sterile nail nipper and dremel nail file without  incident. -Dispensed toe cap to wear as needed for right 1st toe  -Patient to return in 3 months for follow up evaluation or sooner if symptoms worsen.  Asencion Islam, DPM

## 2016-12-18 ENCOUNTER — Encounter

## 2016-12-21 ENCOUNTER — Inpatient Hospital Stay: Admit: 2016-12-21 | Payer: MEDICARE | Attending: Internal Medicine | Primary: Internal Medicine

## 2016-12-21 DIAGNOSIS — R601 Generalized edema: Secondary | ICD-10-CM

## 2016-12-23 ENCOUNTER — Encounter

## 2017-01-04 ENCOUNTER — Encounter: Primary: Internal Medicine

## 2017-01-04 ENCOUNTER — Inpatient Hospital Stay: Admit: 2017-01-04 | Payer: MEDICARE | Attending: Internal Medicine | Primary: Internal Medicine

## 2017-01-04 DIAGNOSIS — I480 Paroxysmal atrial fibrillation: Secondary | ICD-10-CM

## 2017-01-04 DIAGNOSIS — R609 Edema, unspecified: Secondary | ICD-10-CM

## 2017-01-04 NOTE — Procedures (Signed)
[facility]  Holters /Event recorder  NAME:  Radigan, Layani  AGE: 83 y  DATE: 01/04/2017  SEX: F  REFERRING PHYSICIAN: THOMAS L MAUSER  MR# [id number]  LOCATION: [facility]  BILLING#  [id number]        This is a 48-hour Holter monitor.    Predominant rhythm was sinus rhythm.  There were no triggered events noted.  There were 81,826 beats of supraventricular ectopic atrial activity.  The longest activity consisted of 13 beats at 90 beats per minute, consistent with wandering atrial pacemaker.  The fastest supraventricular run was at 119 beats per minute, consisting of 3 beats, consistent with paroxysmal atrial tachycardia.  There were no atrial fibrillation episodes noted.      ___________________  Hilliard Borges Macatangay-Geronilla MD  Dictated By:.   MLT  D:10/26/201810/26/2018 17:26:30  T: 01/08/2017 17:42:36  [id number]

## 2017-01-04 NOTE — Progress Notes (Signed)
2D echocardiogram was completed.

## 2017-01-05 ENCOUNTER — Encounter: Primary: Internal Medicine

## 2017-01-05 ENCOUNTER — Ambulatory Visit: Payer: MEDICARE | Primary: Internal Medicine

## 2017-01-06 ENCOUNTER — Encounter: Primary: Internal Medicine

## 2017-01-06 ENCOUNTER — Inpatient Hospital Stay: Payer: MEDICARE | Attending: Internal Medicine | Primary: Internal Medicine

## 2017-01-08 NOTE — Procedures (Signed)
[facility]  Holters Doreatha Massed/Event recorder  NAME:  [name] [name], [name] [name]  AGE: [age] y  DATE: 01/04/2017  SEX: F  REFERRING PHYSICIAN: Blenda BridegroomHOMAS L MAUSER  MR# [id number]  LOCATION: [facility]  BILLING#  [id number]        This is a 48-hour Holter monitor.    Predominant rhythm was sinus rhythm.  There were no triggered events noted.  There were 81,826 beats of supraventricular ectopic atrial activity.  The longest activity consisted of 13 beats at 90 beats per minute, consistent with wandering atrial pacemaker.  The fastest supraventricular run was at 119 beats per minute, consisting of 3 beats, consistent with paroxysmal atrial tachycardia.  There were no atrial fibrillation episodes noted.      ___________________  Lionel Decemberonstancia Macatangay-Geronilla MD  Dictated By:.   MLT  D:10/26/201810/26/2018 17:26:30  T: 01/08/2017 17:42:36  [id number]

## 2017-03-23 ENCOUNTER — Ambulatory Visit: Payer: Medicare Other | Admitting: Sports Medicine

## 2017-05-01 ENCOUNTER — Encounter (HOSPITAL_COMMUNITY): Payer: Self-pay | Admitting: *Deleted

## 2017-05-01 ENCOUNTER — Emergency Department (HOSPITAL_COMMUNITY): Payer: Medicare Other

## 2017-05-01 ENCOUNTER — Other Ambulatory Visit: Payer: Self-pay

## 2017-05-01 ENCOUNTER — Inpatient Hospital Stay (HOSPITAL_COMMUNITY)
Admission: EM | Admit: 2017-05-01 | Discharge: 2017-05-04 | DRG: 536 | Disposition: A | Discharge status: Home Health | Payer: Medicare Other | Attending: Internal Medicine | Admitting: Internal Medicine

## 2017-05-01 DIAGNOSIS — Z96641 Presence of right artificial hip joint: Secondary | ICD-10-CM | POA: Diagnosis present

## 2017-05-01 DIAGNOSIS — S40011A Contusion of right shoulder, initial encounter: Secondary | ICD-10-CM | POA: Diagnosis present

## 2017-05-01 DIAGNOSIS — E559 Vitamin D deficiency, unspecified: Secondary | ICD-10-CM | POA: Diagnosis not present

## 2017-05-01 DIAGNOSIS — E7800 Pure hypercholesterolemia, unspecified: Secondary | ICD-10-CM | POA: Diagnosis present

## 2017-05-01 DIAGNOSIS — M8580 Other specified disorders of bone density and structure, unspecified site: Secondary | ICD-10-CM | POA: Diagnosis present

## 2017-05-01 DIAGNOSIS — I959 Hypotension, unspecified: Secondary | ICD-10-CM | POA: Diagnosis not present

## 2017-05-01 DIAGNOSIS — I2510 Atherosclerotic heart disease of native coronary artery without angina pectoris: Secondary | ICD-10-CM | POA: Diagnosis not present

## 2017-05-01 DIAGNOSIS — F0390 Unspecified dementia without behavioral disturbance: Secondary | ICD-10-CM | POA: Diagnosis present

## 2017-05-01 DIAGNOSIS — E119 Type 2 diabetes mellitus without complications: Secondary | ICD-10-CM | POA: Diagnosis not present

## 2017-05-01 DIAGNOSIS — E785 Hyperlipidemia, unspecified: Secondary | ICD-10-CM | POA: Diagnosis not present

## 2017-05-01 DIAGNOSIS — K5790 Diverticulosis of intestine, part unspecified, without perforation or abscess without bleeding: Secondary | ICD-10-CM | POA: Diagnosis present

## 2017-05-01 DIAGNOSIS — D72829 Elevated white blood cell count, unspecified: Secondary | ICD-10-CM | POA: Diagnosis present

## 2017-05-01 DIAGNOSIS — R296 Repeated falls: Secondary | ICD-10-CM | POA: Diagnosis present

## 2017-05-01 DIAGNOSIS — E079 Disorder of thyroid, unspecified: Secondary | ICD-10-CM | POA: Diagnosis present

## 2017-05-01 DIAGNOSIS — Z87891 Personal history of nicotine dependence: Secondary | ICD-10-CM | POA: Diagnosis not present

## 2017-05-01 DIAGNOSIS — W010XXA Fall on same level from slipping, tripping and stumbling without subsequent striking against object, initial encounter: Secondary | ICD-10-CM | POA: Diagnosis present

## 2017-05-01 DIAGNOSIS — I25810 Atherosclerosis of coronary artery bypass graft(s) without angina pectoris: Secondary | ICD-10-CM | POA: Diagnosis not present

## 2017-05-01 DIAGNOSIS — R0600 Dyspnea, unspecified: Secondary | ICD-10-CM

## 2017-05-01 DIAGNOSIS — Y92129 Unspecified place in nursing home as the place of occurrence of the external cause: Secondary | ICD-10-CM

## 2017-05-01 DIAGNOSIS — I10 Essential (primary) hypertension: Secondary | ICD-10-CM | POA: Diagnosis present

## 2017-05-01 DIAGNOSIS — R413 Other amnesia: Secondary | ICD-10-CM | POA: Diagnosis present

## 2017-05-01 DIAGNOSIS — Z7901 Long term (current) use of anticoagulants: Secondary | ICD-10-CM

## 2017-05-01 DIAGNOSIS — I482 Chronic atrial fibrillation: Secondary | ICD-10-CM | POA: Diagnosis not present

## 2017-05-01 DIAGNOSIS — S32591A Other specified fracture of right pubis, initial encounter for closed fracture: Principal | ICD-10-CM

## 2017-05-01 DIAGNOSIS — I4891 Unspecified atrial fibrillation: Secondary | ICD-10-CM | POA: Diagnosis present

## 2017-05-01 DIAGNOSIS — Z8249 Family history of ischemic heart disease and other diseases of the circulatory system: Secondary | ICD-10-CM | POA: Diagnosis not present

## 2017-05-01 DIAGNOSIS — Z66 Do not resuscitate: Secondary | ICD-10-CM | POA: Diagnosis not present

## 2017-05-01 DIAGNOSIS — S32599A Other specified fracture of unspecified pubis, initial encounter for closed fracture: Secondary | ICD-10-CM | POA: Diagnosis present

## 2017-05-01 DIAGNOSIS — R0902 Hypoxemia: Secondary | ICD-10-CM | POA: Diagnosis not present

## 2017-05-01 HISTORY — DX: Repeated falls: R29.6

## 2017-05-01 LAB — BASIC METABOLIC PANEL
ANION GAP: 12 (ref 5–15)
BUN: 18 mg/dL (ref 6–20)
CO2: 22 mmol/L (ref 22–32)
Calcium: 9.4 mg/dL (ref 8.9–10.3)
Chloride: 104 mmol/L (ref 101–111)
Creatinine, Ser: 0.83 mg/dL (ref 0.44–1.00)
Glucose, Bld: 157 mg/dL — ABNORMAL HIGH (ref 65–99)
Potassium: 4 mmol/L (ref 3.5–5.1)
Sodium: 138 mmol/L (ref 135–145)

## 2017-05-01 LAB — CBC
HEMATOCRIT: 44.4 % (ref 36.0–46.0)
HEMOGLOBIN: 15 g/dL (ref 12.0–15.0)
MCH: 33.1 pg (ref 26.0–34.0)
MCHC: 33.8 g/dL (ref 30.0–36.0)
MCV: 98 fL (ref 78.0–100.0)
Platelets: 250 10*3/uL (ref 150–400)
RBC: 4.53 MIL/uL (ref 3.87–5.11)
RDW: 14.4 % (ref 11.5–15.5)
WBC: 14.8 10*3/uL — AB (ref 4.0–10.5)

## 2017-05-01 LAB — URINALYSIS, ROUTINE W REFLEX MICROSCOPIC
BACTERIA UA: NONE SEEN
Bilirubin Urine: NEGATIVE
GLUCOSE, UA: NEGATIVE mg/dL
Hgb urine dipstick: NEGATIVE
KETONES UR: NEGATIVE mg/dL
Nitrite: NEGATIVE
PROTEIN: NEGATIVE mg/dL
RBC / HPF: NONE SEEN RBC/hpf (ref 0–5)
Specific Gravity, Urine: 1.015 (ref 1.005–1.030)
pH: 7 (ref 5.0–8.0)

## 2017-05-01 LAB — CBC WITH DIFFERENTIAL/PLATELET
BASOS ABS: 0 10*3/uL (ref 0.0–0.1)
BASOS PCT: 0 %
Eosinophils Absolute: 0.1 10*3/uL (ref 0.0–0.7)
Eosinophils Relative: 0 %
HEMATOCRIT: 43.6 % (ref 36.0–46.0)
Hemoglobin: 14.7 g/dL (ref 12.0–15.0)
Lymphocytes Relative: 7 %
Lymphs Abs: 1.1 10*3/uL (ref 0.7–4.0)
MCH: 33 pg (ref 26.0–34.0)
MCHC: 33.7 g/dL (ref 30.0–36.0)
MCV: 98 fL (ref 78.0–100.0)
MONO ABS: 1.1 10*3/uL — AB (ref 0.1–1.0)
Monocytes Relative: 7 %
Neutro Abs: 14.1 10*3/uL — ABNORMAL HIGH (ref 1.7–7.7)
Neutrophils Relative %: 86 %
PLATELETS: 239 10*3/uL (ref 150–400)
RBC: 4.45 MIL/uL (ref 3.87–5.11)
RDW: 14.4 % (ref 11.5–15.5)
WBC: 16.5 10*3/uL — AB (ref 4.0–10.5)

## 2017-05-01 LAB — MRSA PCR SCREENING: MRSA BY PCR: NEGATIVE

## 2017-05-01 LAB — GLUCOSE, CAPILLARY: GLUCOSE-CAPILLARY: 164 mg/dL — AB (ref 65–99)

## 2017-05-01 MED ORDER — FENTANYL CITRATE (PF) 100 MCG/2ML IJ SOLN
50.0000 ug | Freq: Once | INTRAMUSCULAR | Status: AC
  Administered 2017-05-01: 50 ug via INTRAVENOUS
  Filled 2017-05-01: qty 2

## 2017-05-01 MED ORDER — FENTANYL CITRATE (PF) 100 MCG/2ML IJ SOLN
12.5000 ug | INTRAMUSCULAR | Status: DC | PRN
  Administered 2017-05-01 – 2017-05-02 (×2): 12.5 ug via INTRAVENOUS
  Filled 2017-05-01 (×2): qty 2

## 2017-05-01 MED ORDER — MAGNESIUM CITRATE PO SOLN: 1.0000 | Freq: Once | ORAL | Status: DC | PRN

## 2017-05-01 MED ORDER — SODIUM CHLORIDE 0.9 % IV BOLUS (SEPSIS)
1000.0000 mL | Freq: Once | INTRAVENOUS | Status: AC
  Administered 2017-05-01: 1000 mL via INTRAVENOUS

## 2017-05-01 MED ORDER — LISINOPRIL 5 MG PO TABS
5.0000 mg | ORAL_TABLET | Freq: Every day | ORAL | Status: DC
  Administered 2017-05-01: 5 mg via ORAL
  Filled 2017-05-01: qty 2

## 2017-05-01 MED ORDER — SENNOSIDES-DOCUSATE SODIUM 8.6-50 MG PO TABS
2.0000 | ORAL_TABLET | Freq: Every day | ORAL | Status: DC
  Administered 2017-05-02 – 2017-05-03 (×2): 2 via ORAL
  Filled 2017-05-01 (×2): qty 2

## 2017-05-01 MED ORDER — BUSPIRONE HCL 5 MG PO TABS
10.0000 mg | ORAL_TABLET | Freq: Two times a day (BID) | ORAL | Status: DC
  Administered 2017-05-01 – 2017-05-04 (×6): 10 mg via ORAL
  Filled 2017-05-01 (×6): qty 2

## 2017-05-01 MED ORDER — ACETAMINOPHEN 500 MG PO TABS
1000.0000 mg | ORAL_TABLET | Freq: Once | ORAL | Status: AC
  Administered 2017-05-01: 1000 mg via ORAL
  Filled 2017-05-01: qty 2

## 2017-05-01 MED ORDER — INSULIN ASPART 100 UNIT/ML ~~LOC~~ SOLN
0.0000 [IU] | Freq: Three times a day (TID) | SUBCUTANEOUS | Status: DC
  Administered 2017-05-01 – 2017-05-02 (×2): 2 [IU] via SUBCUTANEOUS
  Administered 2017-05-02 – 2017-05-04 (×4): 1 [IU] via SUBCUTANEOUS

## 2017-05-01 MED ORDER — SODIUM CHLORIDE 0.9 % IV SOLN
INTRAVENOUS | Status: DC
  Administered 2017-05-01 – 2017-05-02 (×2): via INTRAVENOUS

## 2017-05-01 MED ORDER — VANCOMYCIN HCL IN DEXTROSE 1-5 GM/200ML-% IV SOLN
1000.0000 mg | Freq: Once | INTRAVENOUS | Status: AC
  Administered 2017-05-01: 1000 mg via INTRAVENOUS
  Filled 2017-05-01: qty 200

## 2017-05-01 MED ORDER — METOPROLOL TARTRATE 12.5 MG HALF TABLET
12.5000 mg | ORAL_TABLET | Freq: Two times a day (BID) | ORAL | Status: DC
  Administered 2017-05-01 – 2017-05-02 (×3): 12.5 mg via ORAL
  Filled 2017-05-01 (×3): qty 1

## 2017-05-01 MED ORDER — PIPERACILLIN-TAZOBACTAM 3.375 G IVPB
3.3750 g | Freq: Three times a day (TID) | INTRAVENOUS | Status: DC
  Administered 2017-05-01 – 2017-05-04 (×9): 3.375 g via INTRAVENOUS
  Filled 2017-05-01 (×11): qty 50

## 2017-05-01 MED ORDER — SODIUM CHLORIDE 0.9 % IV SOLN
500.0000 mg | Freq: Two times a day (BID) | INTRAVENOUS | Status: DC
  Administered 2017-05-02 – 2017-05-03 (×3): 500 mg via INTRAVENOUS
  Filled 2017-05-01 (×3): qty 500

## 2017-05-01 MED ORDER — BISACODYL 10 MG RE SUPP: 10.0000 mg | Freq: Every day | RECTAL | Status: DC | PRN

## 2017-05-01 MED ORDER — SODIUM CHLORIDE 0.9 % IV SOLN
Freq: Once | INTRAVENOUS | Status: AC
  Administered 2017-05-01: 12:00:00 via INTRAVENOUS

## 2017-05-01 MED ORDER — FUROSEMIDE 20 MG PO TABS: 20.0000 mg | ORAL_TABLET | Freq: Every day | ORAL | Status: DC

## 2017-05-01 NOTE — ED Triage Notes (Signed)
Pt standing up beside bed with walker and 2 assist . Pt unable to ambulate and reports hurt when moving Rt leg.

## 2017-05-01 NOTE — ED Notes (Signed)
Patient transported to X-ray 

## 2017-05-01 NOTE — ED Notes (Signed)
Pt provided with apple juice and crackers, husband at bedside

## 2017-05-01 NOTE — ED Triage Notes (Signed)
EDP at Pt bedside  To eval  PT for mobility.

## 2017-05-01 NOTE — ED Triage Notes (Signed)
Report [facility] by EMS[facility] side . Pt. Reported Pain to RT hip ,which has been replaced. Pt also reports rt shoulder pain after fall . Ems report Pt was standing with her walker on their arrival to SNF.

## 2017-05-01 NOTE — ED Notes (Signed)
Patient transported to CT 

## 2017-05-01 NOTE — Progress Notes (Signed)
Called because patient's blood pressure has dropped to 88/60. She had previously been hypertensive. She herself is unable to provide history. She has received a dose of IV pain medication approximately an hour ago and this is the first blood pressure since then.  I'm unclear of etiology of new hypotension. Patient does have elevated WBC however no clear source of infection. Nonetheless we will start broad-spectrum antibiotics and start aggressive fluid resuscitation.  Also get a stat CBC to see if patient may be bleeding into her hip or thigh.

## 2017-05-01 NOTE — H&P (Signed)
History and Physical:    Yvonne Butler   ZOX:[id number] DOB: Sep 21, 1933 DOA: 05/01/2017  Referring MD/provider: Dr Erma Heritage  PCP: Merlene Laughter, MD   Patient coming from: Home  Chief Complaint: Fall  History of Present Illness:   Yvonne Butler is an 84 y.o. female with dementia who lives in a memory care unit, who has a history of frequent falls who was noted to have fallen earlier today by the staff at her memory care unit. The fall was unwitnessed per patient's daughter and husband. Patient herself is unable to provide me with a history. Patient's family notes that she has had several falls in the past several months. They're not sure why she does fall although she is noted to walk without her walker as she forgets to take it. Previous workup has been negative.  Patient is able to tell me that she is not in pain. Patient's daughter notes that she was quite agitated when they came in however after she received her pain medication she is much quieter.   ED Course:  The patient was noted to have a pubic ramus fracture. Orthopedics is being consulted. She is admitted for pain management and management of her pubic ramus fracture.  ROS:   ROS   Unable to do due to depression  Past Medical History:   Past Medical History:  Diagnosis Date  . CAD (coronary artery disease)    with CABG- Dr. Katrinka Blazing  . Complication of anesthesia   . Coronary atherosclerosis of native coronary artery   . Diabetes (HCC)   . Diverticulosis   . Falls frequently 05/01/2017  . Hypercholesteremia   . Hyperlipidemia   . Osteopenia   . PONV (postoperative nausea and vomiting)   . Vitamin D deficiency     Past Surgical History:   Past Surgical History:  Procedure Laterality Date  . APPENDECTOMY    . CORONARY ARTERY BYPASS GRAFT      in with LIMA to LAD, SVG to the circumflex, SVG to the diagonal, and SVG to the PDA.  [name] HIP ARTHROPLASTY Right 02/19/2013   Procedure: HEMI ARTHROPLASTY BIPOLAR HIP  CEMENTED VS UNCEMENTED;  Surgeon: Harvie Junior, MD;  Location: [facility] OR;  Service: Orthopedics;  Laterality: Right;  . HIP SURGERY Right 02/20/2013   HEMI -BIPOLAR ARTHROPLASTY  . Ovarian Cyst & Fallopian Tubes      Social History:   Social History   Socioeconomic History  . Marital status: Married    Spouse name: Not on file  . Number of children: Not on file  . Years of education: Not on file  . Highest education level: Not on file  Social Needs  . Financial resource strain: Not on file  . Food insecurity - worry: Not on file  . Food insecurity - inability: Not on file  . Transportation needs - medical: Not on file  . Transportation needs - non-medical: Not on file  Occupational History  . Not on file  Tobacco Use  . Smoking status: Former Smoker    Types: Cigarettes  . Smokeless tobacco: Never Used  Substance and Sexual Activity  . Alcohol use: No  . Drug use: No  . Sexual activity: No  Other Topics Concern  . Not on file  Social History Narrative  . Not on file    Allergies   Patient has no known allergies.  Family history:   Family History  Problem Relation Age of Onset  . Heart disease Father  Current Medications:   Prior to Admission medications   Medication Sig Start Date End Date Taking? Authorizing Provider  busPIRone (BUSPAR) 10 MG tablet Take 10 mg by mouth 2 (two) times daily. 11/22/16  Yes [provider]  camphor-menthol Wynelle Fanny(SARNA) lotion Apply 1 application topically 3 (three) times daily.   Yes [provider]  furosemide (LASIX) 20 MG tablet Take 1 tablet by mouth daily. 03/25/15  Yes [provider]  glimepiride (AMARYL) 1 MG tablet Take 1 mg by mouth See admin instructions. Taking one tablet with breakfast and 1 tablet with lunch daily 04/23/17  Yes [provider]  guaiFENesin (ROBITUSSIN) 100 MG/5ML SOLN Take 10 mLs by mouth every 6 (six) hours as needed for cough or to loosen phlegm.   Yes [provider]  hydrocortisone 2.5 % cream Apply 1 application topically daily as needed for itching. 03/23/17  Yes [provider]  hydrOXYzine (ATARAX/VISTARIL) 10 MG tablet Take 10 mg by mouth 3 (three) times daily. 11/20/16  Yes [provider]  lisinopril (PRINIVIL,ZESTRIL) 5 MG tablet Take 5 mg by mouth daily. 04/23/17  Yes [provider]  metoprolol (LOPRESSOR) 25 MG tablet Take 1 tablet (25 mg total) by mouth 2 (two) times daily. Patient taking differently: Take 12.5 mg by mouth 2 (two) times daily.  04/18/15  Yes Lyn RecordsSmith, Henry W, MD  Multiple Vitamin (MULTIVITAMIN) tablet Take 1 tablet by mouth daily.   Yes [provider]  Multiple Vitamins-Minerals (PRESERVISION AREDS 2) CAPS Take 1 capsule by mouth 2 (two) times daily.   Yes [provider]  traMADol-acetaminophen (ULTRACET) 37.5-325 MG tablet Take 1 tablet by mouth 2 (two) times daily. 11/24/16  Yes [provider]    Physical Exam:   Vitals:   05/01/17 0800 05/01/17 0813 05/01/17 1059  BP:  (!) 162/76 (!) 176/93  Pulse:  81 91  Resp:  16 17  Temp:  97.6 F (36.4 C) 98.1 F (36.7 C)  TempSrc:  Oral Oral  SpO2:  92% 91%  Weight: 59 kg (130 lb)    Height: 5\' 2"  (1.575 m)       Physical Exam: Blood pressure (!) 176/93, pulse 91, temperature 98.1 F (36.7 C), temperature source Oral, resp. rate 17, height 5\' 2"  (1.575 m), weight 59 kg (130 lb), SpO2 91 %. Gen: Wasn't well appearing patient lying flat in bed in no distress with daughter and husband at bedside. Eyes: Sclerae anicteric. Conjunctiva mildly injected. Neck: Supple, no jugular venous distention. Chest: Moderately good air entry bilaterally with no adventitious sounds.  CV: Distant, irregular, no audible murmurs. Abdomen: NABS, soft, nondistended, nontender. No tenderness to light or deep palpation.  Extremities: She is holding her right leg still, no ecchymoses as yet. No edema Skin: Warm and dry. No rashes, lesions  or wounds. Neuro: Patient is alert and cooperative. She clearly has memory loss however her speech is without dysarthria and she is appropriate and her response although she does intermittently speak gibberish. Patient's family states this is at her baseline. Psych: Advanced dementia  Data Review:    Labs: Basic Metabolic Panel: Recent Labs  Lab 05/01/17 1115  NA 138  K 4.0  CL 104  CO2 22  GLUCOSE 157*  BUN 18  CREATININE 0.83  CALCIUM 9.4   Liver Function Tests: No results for input(s): AST, ALT, ALKPHOS, BILITOT, PROT, ALBUMIN in the last 168 hours. No results for input(s): LIPASE, AMYLASE in the last 168 hours. No results for input(s):  AMMONIA in the last 168 hours. CBC: Recent Labs  Lab 05/01/17 1115  WBC 16.5*  NEUTROABS 14.1*  HGB 14.7  HCT 43.6  MCV 98.0  PLT 239   Cardiac Enzymes: No results for input(s): CKTOTAL, CKMB, CKMBINDEX, TROPONINI in the last 168 hours.  BNP (last 3 results) No results for input(s): PROBNP in the last 8760 hours. CBG: No results for input(s): GLUCAP in the last 168 hours.  Urinalysis    Component Value Date/Time   COLORURINE YELLOW 02/19/2013 1252   APPEARANCEUR CLEAR 02/19/2013 1252   LABSPEC 1.012 02/19/2013 1252   PHURINE 7.0 02/19/2013 1252   GLUCOSEU NEGATIVE 02/19/2013 1252   HGBUR NEGATIVE 02/19/2013 1252   BILIRUBINUR NEGATIVE 02/19/2013 1252   KETONESUR NEGATIVE 02/19/2013 1252   PROTEINUR NEGATIVE 02/19/2013 1252   UROBILINOGEN 0.2 02/19/2013 1252   NITRITE NEGATIVE 02/19/2013 1252   LEUKOCYTESUR NEGATIVE 02/19/2013 1252      Radiographic Studies: Dg Chest 2 View  Result Date: 05/01/2017 CLINICAL DATA:  Fall, pain, initial encounter. EXAM: CHEST  2 VIEW COMPARISON:  06/03/2015. FINDINGS: Trachea is deviated slightly to the left, which may be due to right thyroid enlargement. Heart is enlarged. Thoracic aorta is calcified. Biapical pleural scarring. Lungs are somewhat low in volume with mild atelectasis at  the left lung base. Tiny pleural effusion on the lateral view is difficult to localize on the frontal view. Visualized osseous structures appear grossly intact. IMPRESSION: 1. Left basilar atelectasis. 2. Tiny pleural effusion, difficult to localize on the frontal view. Electronically Signed   By: Leanna Battles [name]   On: 05/01/2017 09:06   Ct Head Wo Contrast  Result Date: 05/01/2017 CLINICAL DATA:  Found on the floor. EXAM: CT HEAD WITHOUT CONTRAST CT CERVICAL SPINE WITHOUT CONTRAST TECHNIQUE: Multidetector CT imaging of the head and cervical spine was performed following the standard protocol without intravenous contrast. Multiplanar CT image reconstructions of the cervical spine were also generated. COMPARISON:  CT head and cervical spine dated January 07, 2016. FINDINGS: CT HEAD FINDINGS Brain: No evidence of acute infarction, hemorrhage, hydrocephalus, extra-axial collection or mass lesion/mass effect. Stable atrophy and chronic microvascular ischemic changes. Vascular: Calcified atherosclerosis at the skullbase. No hyperdense vessel. Skull: Negative for fracture or focal lesion. Sinuses/Orbits: No acute finding. Other: Stable left frontal scalp subcutaneous lesion. CT CERVICAL SPINE FINDINGS Alignment: No traumatic malalignment. Skull base and vertebrae: No acute fracture. No primary bone lesion or focal pathologic process. Soft tissues and spinal canal: No prevertebral fluid or swelling. No visible canal hematoma. Disc levels: Moderate to severe degenerative disc disease and uncovertebral hypertrophy at C5-C6 and C6-C7, similar to prior study. Moderate to severe right-sided facet arthropathy throughout the cervical spine. Multilevel mild to moderate neuroforaminal stenosis. Upper chest: Mild centrilobular emphysema. Other: Slight interval enlargement of the right thyroid mass, now measuring 3.8 cm, previously 3.5 cm. IMPRESSION: 1. No acute intracranial abnormality. Stable atrophy and chronic  microvascular ischemic changes. 2. No acute cervical spine fracture. Stable multilevel degenerative changes. 3. Slight interval enlargement of the right thyroid mass, now measuring 3.8 cm, previously 3.5 cm. Recommend thyroid ultrasound and possible tissue sampling if not previously performed. Electronically Signed   By: Obie Dredge [name]   On: 05/01/2017 09:33   Ct Cervical Spine Wo Contrast  Result Date: 05/01/2017 CLINICAL DATA:  Found on the floor. EXAM: CT HEAD WITHOUT CONTRAST CT CERVICAL SPINE WITHOUT CONTRAST TECHNIQUE: Multidetector CT imaging of the head and cervical spine was performed following the standard protocol without intravenous  contrast. Multiplanar CT image reconstructions of the cervical spine were also generated. COMPARISON:  CT head and cervical spine dated January 07, 2016. FINDINGS: CT HEAD FINDINGS Brain: No evidence of acute infarction, hemorrhage, hydrocephalus, extra-axial collection or mass lesion/mass effect. Stable atrophy and chronic microvascular ischemic changes. Vascular: Calcified atherosclerosis at the skullbase. No hyperdense vessel. Skull: Negative for fracture or focal lesion. Sinuses/Orbits: No acute finding. Other: Stable left frontal scalp subcutaneous lesion. CT CERVICAL SPINE FINDINGS Alignment: No traumatic malalignment. Skull base and vertebrae: No acute fracture. No primary bone lesion or focal pathologic process. Soft tissues and spinal canal: No prevertebral fluid or swelling. No visible canal hematoma. Disc levels: Moderate to severe degenerative disc disease and uncovertebral hypertrophy at C5-C6 and C6-C7, similar to prior study. Moderate to severe right-sided facet arthropathy throughout the cervical spine. Multilevel mild to moderate neuroforaminal stenosis. Upper chest: Mild centrilobular emphysema. Other: Slight interval enlargement of the right thyroid mass, now measuring 3.8 cm, previously 3.5 cm. IMPRESSION: 1. No acute intracranial abnormality.  Stable atrophy and chronic microvascular ischemic changes. 2. No acute cervical spine fracture. Stable multilevel degenerative changes. 3. Slight interval enlargement of the right thyroid mass, now measuring 3.8 cm, previously 3.5 cm. Recommend thyroid ultrasound and possible tissue sampling if not previously performed. Electronically Signed   By: Obie Dredge [name]   On: 05/01/2017 09:33   Ct Pelvis Wo Contrast  Result Date: 05/01/2017 CLINICAL DATA:  Right hip pain after fall.  Evaluate for fracture. EXAM: CT PELVIS WITHOUT CONTRAST TECHNIQUE: Multidetector CT imaging of the pelvis was performed following the standard protocol without intravenous contrast. COMPARISON:  Bilateral hip x-rays from same day. FINDINGS: Bones/Joint/Cartilage Prior right hip hemiarthroplasty. No hardware failure or loosening. Acute, nondisplaced fracture of the right inferior pubic ramus. No additional fracture seen. Normal alignment. No joint effusion. Mild left hip joint space narrowing. Advanced degenerative changes at L5-S1. Ligaments Ligaments are suboptimally evaluated by CT. Muscles and Tendons Atrophy of the right piriformis muscle. Soft tissue No fluid collection or hematoma.  No soft tissue mass. IMPRESSION: 1. Acute, nondisplaced fracture of the right inferior pubic ramus. 2. Prior right hip hemiarthroplasty without evidence of hardware complication. Electronically Signed   By: Obie Dredge [name]   On: 05/01/2017 11:49   Dg Humerus Right  Result Date: 05/01/2017 CLINICAL DATA:  Fall with right arm pain. EXAM: RIGHT HUMERUS - 2+ VIEW COMPARISON:  Right shoulder 08/25/2009 FINDINGS: Mild degenerate change of the [facility] joint. No evidence of fracture or dislocation. IMPRESSION: No acute findings. Electronically Signed   By: Elberta Fortis [name]   On: 05/01/2017 09:05   Dg Hips Bilat W Or Wo Pelvis 3-4 Views  Result Date: 05/01/2017 CLINICAL DATA:  Fall with left hip pain. EXAM: DG HIP (WITH OR WITHOUT PELVIS) 3-4V BILAT  COMPARISON:  02/19/2013 FINDINGS: Right hip arthroplasty unchanged. Mild degenerate change of the left hip. No definite left hip fracture or dislocation. Mild focal irregularity over the right inferior pubic ramus which may represent a nondisplaced fracture. Degenerate change of the spine. IMPRESSION: No acute left hip findings. Focal cortical regularity over the right inferior pubic ramus as this may represent a nondisplaced fracture. Consider CT for further evaluation. Right hip arthroplasty unchanged. Electronically Signed   By: Elberta Fortis [name]   On: 05/01/2017 09:10    EKG: Ordered, has not yet been done.   Assessment/Plan:   Active Problems:   Pubic ramus fracture (HCC)  PUBIC RAMUS FRACTURE Orthopedics consult pending Will start fentanyl  12.5 every 2 when necessary pain management I am holding patient's tramadol until we can see what her opioid needs are and oral opioids can be started for outpatient treatment.  LEUKOCYTOSIS Infection workup here is negative with a negative chest x-ray and a benign abdominal exam. UA is pending.  FALL Patient with multiple histories of falls in the past with a negative workup. Head CT here is negative for intracranial hemorrhage.  DM Will hold Amaryl until we see if patient is taking her usual by mouth and take even on her opioids which can cause appetite suppression. SSRI every before meals for now  THYROID MASS Follow-up as an outpatient as warranted  DEMENTIA Continue Aricept, buspirone  AFIB On Eliquis and metoprolol, will continue  CAD On aspirin, metoprolol and pravastatin    Other information:   DVT prophylaxis:  On Eliquis Code Status: DO NOT RESUSCITATE Family Communication:  Patient's family was at bedside throughout  Disposition Plan:  Memory care unit versus skilled nursing facility Consults called:  Orthopedics Admission status:  Inpatient    Pieter Partridge [facility] Pager [phone] Cell: [phone]   If 7PM-7AM, please contact night-coverage [email or web address] Password [facility] 05/01/2017, 12:45 PM

## 2017-05-01 NOTE — Progress Notes (Signed)
Pharmacy Antibiotic Note  Yvonne GreavesBarbara S Butler is a 84 y.o. female admitted on 05/01/2017 with sepsis.  Pharmacy has been consulted for vancomycin and Zosyn dosing.  Blood pressure dropped after receiving pain medication (but 1h after administration and first value obtained), also possibly bleeding into hip or thigh. Slight WBC elevation, but no clear source of infection.  Plan: Vancomycin 1g IV x1, then 500mg  IV q12h Zosyn 3.375g IV q8h EI Follow c/s, clinical progression, renal function, level PRN   Height: 5\' 2"  (157.5 cm) Weight: 130 lb (59 kg) IBW/kg (Calculated) : 50.1  Temp (24hrs), Avg:97.7 F (36.5 C), Min:97.3 F (36.3 C), Max:98.1 F (36.7 C)  Recent Labs  Lab 05/01/17 1115  WBC 16.5*  CREATININE 0.83    Estimated Creatinine Clearance: 42 mL/min (by C-G formula based on SCr of 0.83 mg/dL).    No Known Allergies  Antimicrobials this admission: Vancomycin 2/16 >>  Zosyn 2/16 >>   Dose adjustments this admission: n/a  Microbiology results: 2/16 MRSA PCR:   Thank you for allowing pharmacy to be a part of this patient's care.  Yvonne Butler D. Tonetta Napoles, PharmD, BCPS Clinical Pharmacist  [phone] 05/01/2017 6:49 PM

## 2017-05-01 NOTE — ED Notes (Signed)
Pt daughter Isabel CapriceKathy Wilson [phone] asked to be contacted with updates/questions.

## 2017-05-01 NOTE — ED Notes (Signed)
Pt .undressed in a gown o go x ray

## 2017-05-01 NOTE — ED Provider Notes (Signed)
[facility] EMERGENCY DEPARTMENT Provider Note   CSN: [id number] Arrival date & time: 05/01/17  0750     History   Chief Complaint Chief Complaint  Patient presents with  . Fall    HPI Yvonne Butler is a 84 y.o. female.  HPI   84 year old female with history of A. fib here with unwitnessed fall.  The patient was last seen normal last night.  She reportedly got up to start walking out of her room.  She forgot to use her walker due to her dementia.  She then fell.  She reportedly initially complained of right hip pain as well as right upper arm pain.  On my assessment here, the patient declines any complaints.  History limited by severe dementia.  Per daughter, the patient is no longer on blood thinners.  Daughter last saw her on Sunday, and she was in her usual state of health.  She has not been ill or unwell lately.  No recent medication change.  Level 5 caveat invoked as remainder of history, ROS, and physical exam limited due to patient's dementia.   Past Medical History:  Diagnosis Date  . CAD (coronary artery disease)    with CABG- Dr. Katrinka Blazing  . Complication of anesthesia   . Coronary atherosclerosis of native coronary artery   . Diabetes (HCC)   . Diverticulosis   . Falls frequently 05/01/2017  . Hypercholesteremia   . Hyperlipidemia   . Osteopenia   . PONV (postoperative nausea and vomiting)   . Vitamin D deficiency     Patient Active Problem List   Diagnosis Date Noted  . Falls frequently 05/01/2017  . Pubic ramus fracture (HCC) 05/01/2017  . Chronic anticoagulation 02/05/2016  . Atrial fibrillation (HCC) 03/27/2015  . Other bilateral secondary osteoarthritis of knee 02/21/2013  . Femoral neck fracture (HCC) 02/19/2013  . Leukocytosis 02/19/2013  . Memory loss 02/19/2013  . Fracture of femoral neck, right (HCC) 02/19/2013  . Coronary atherosclerosis of autologous vein bypass graft 01/03/2013    Class: Diagnosis of  . HLD  (hyperlipidemia) 01/03/2013    Class: Chronic  . DM ketoacidosis type II, uncontrolled (HCC) 01/03/2013    Class: Chronic    Past Surgical History:  Procedure Laterality Date  . APPENDECTOMY    . CORONARY ARTERY BYPASS GRAFT      in with LIMA to LAD, SVG to the circumflex, SVG to the diagonal, and SVG to the PDA.  [name] HIP ARTHROPLASTY Right 02/19/2013   Procedure: HEMI ARTHROPLASTY BIPOLAR HIP CEMENTED VS UNCEMENTED;  Surgeon: Harvie Junior, MD;  Location: [facility] OR;  Service: Orthopedics;  Laterality: Right;  . HIP SURGERY Right 02/20/2013   HEMI -BIPOLAR ARTHROPLASTY  . Ovarian Cyst & Fallopian Tubes      OB History    No data available       Home Medications    Prior to Admission medications   Medication Sig Start Date End Date Taking? Authorizing Provider  busPIRone (BUSPAR) 10 MG tablet Take 10 mg by mouth 2 (two) times daily. 11/22/16  Yes [provider]  camphor-menthol Wynelle Fanny) lotion Apply 1 application topically 3 (three) times daily.   Yes [provider]  furosemide (LASIX) 20 MG tablet Take 1 tablet by mouth daily. 03/25/15  Yes [provider]  glimepiride (AMARYL) 1 MG tablet Take 1 mg by mouth See admin instructions. Taking one tablet with breakfast and 1 tablet with lunch daily 04/23/17  Yes [provider]  guaiFENesin (  ROBITUSSIN) 100 MG/5ML SOLN Take 10 mLs by mouth every 6 (six) hours as needed for cough or to loosen phlegm.   Yes [provider]  hydrocortisone 2.5 % cream Apply 1 application topically daily as needed for itching. 03/23/17  Yes [provider]  hydrOXYzine (ATARAX/VISTARIL) 10 MG tablet Take 10 mg by mouth 3 (three) times daily. 11/20/16  Yes [provider]  lisinopril (PRINIVIL,ZESTRIL) 5 MG tablet Take 5 mg by mouth daily. 04/23/17  Yes [provider]  metoprolol (LOPRESSOR) 25 MG tablet Take 1 tablet (25 mg total) by mouth 2 (two) times daily. Patient taking differently: Take 12.5 mg  by mouth 2 (two) times daily.  04/18/15  Yes Lyn Records, MD  Multiple Vitamin (MULTIVITAMIN) tablet Take 1 tablet by mouth daily.   Yes [provider]  Multiple Vitamins-Minerals (PRESERVISION AREDS 2) CAPS Take 1 capsule by mouth 2 (two) times daily.   Yes [provider]  traMADol-acetaminophen (ULTRACET) 37.5-325 MG tablet Take 1 tablet by mouth 2 (two) times daily. 11/24/16  Yes [provider]    Family History Family History  Problem Relation Age of Onset  . Heart disease Father     Social History Social History   Tobacco Use  . Smoking status: Former Smoker    Types: Cigarettes  . Smokeless tobacco: Never Used  Substance Use Topics  . Alcohol use: No  . Drug use: No     Allergies   Patient has no known allergies.   Review of Systems Review of Systems  Unable to perform ROS: Dementia     Physical Exam Updated Vital Signs BP (!) 164/104   Pulse 91   Temp (!) 97.3 F (36.3 C) (Oral)   Resp 17   Ht 5\' 2"  (1.575 m)   Wt 59 kg (130 lb)   SpO2 98%   BMI 23.78 kg/m   Physical Exam  Constitutional: She is oriented to person, place, and time. She appears well-developed and well-nourished. No distress.  HENT:  Head: Normocephalic and atraumatic.  No apparent head trauma  Eyes: Conjunctivae are normal.  Neck: Neck supple.  No midline or paraspinal tenderness.  Painless range of motion.  Cardiovascular: Normal rate, regular rhythm and normal heart sounds. Exam reveals no friction rub.  No murmur heard. Pulmonary/Chest: Effort normal and breath sounds normal. No respiratory distress. She has no wheezes. She has no rales.  Abdominal: She exhibits no distension.  Musculoskeletal: She exhibits no edema.  Slight wince on palpation of right upper arm.  No bruising or deformity.  No tenderness to the bilateral lower extremities.  No tenderness with logroll.  No tenderness with palpation of the pelvis.  No midline back pain.  Neurological:  She is alert and oriented to person, place, and time. She exhibits normal muscle tone.  Skin: Skin is warm. Capillary refill takes less than 2 seconds.  Psychiatric: She has a normal mood and affect.  Nursing note and vitals reviewed.    ED Treatments / Results  Labs (all labs ordered are listed, but only abnormal results are displayed) Labs Reviewed  URINALYSIS, ROUTINE W REFLEX MICROSCOPIC - Abnormal; Notable for the following components:      Result Value   Leukocytes, UA SMALL (*)    Squamous Epithelial / LPF 0-5 (*)    All other components within normal limits  CBC WITH DIFFERENTIAL/PLATELET - Abnormal; Notable for the following components:   WBC 16.5 (*)    Neutro Abs 14.1 (*)  Monocytes Absolute 1.1 (*)    All other components within normal limits  BASIC METABOLIC PANEL - Abnormal; Notable for the following components:   Glucose, Bld 157 (*)    All other components within normal limits    EKG  EKG Interpretation None       Radiology Dg Chest 2 View  Result Date: 05/01/2017 CLINICAL DATA:  Fall, pain, initial encounter. EXAM: CHEST  2 VIEW COMPARISON:  06/03/2015. FINDINGS: Trachea is deviated slightly to the left, which may be due to right thyroid enlargement. Heart is enlarged. Thoracic aorta is calcified. Biapical pleural scarring. Lungs are somewhat low in volume with mild atelectasis at the left lung base. Tiny pleural effusion on the lateral view is difficult to localize on the frontal view. Visualized osseous structures appear grossly intact. IMPRESSION: 1. Left basilar atelectasis. 2. Tiny pleural effusion, difficult to localize on the frontal view. Electronically Signed   By: Leanna Battles [name]   On: 05/01/2017 09:06   Ct Head Wo Contrast  Result Date: 05/01/2017 CLINICAL DATA:  Found on the floor. EXAM: CT HEAD WITHOUT CONTRAST CT CERVICAL SPINE WITHOUT CONTRAST TECHNIQUE: Multidetector CT imaging of the head and cervical spine was performed following the  standard protocol without intravenous contrast. Multiplanar CT image reconstructions of the cervical spine were also generated. COMPARISON:  CT head and cervical spine dated January 07, 2016. FINDINGS: CT HEAD FINDINGS Brain: No evidence of acute infarction, hemorrhage, hydrocephalus, extra-axial collection or mass lesion/mass effect. Stable atrophy and chronic microvascular ischemic changes. Vascular: Calcified atherosclerosis at the skullbase. No hyperdense vessel. Skull: Negative for fracture or focal lesion. Sinuses/Orbits: No acute finding. Other: Stable left frontal scalp subcutaneous lesion. CT CERVICAL SPINE FINDINGS Alignment: No traumatic malalignment. Skull base and vertebrae: No acute fracture. No primary bone lesion or focal pathologic process. Soft tissues and spinal canal: No prevertebral fluid or swelling. No visible canal hematoma. Disc levels: Moderate to severe degenerative disc disease and uncovertebral hypertrophy at C5-C6 and C6-C7, similar to prior study. Moderate to severe right-sided facet arthropathy throughout the cervical spine. Multilevel mild to moderate neuroforaminal stenosis. Upper chest: Mild centrilobular emphysema. Other: Slight interval enlargement of the right thyroid mass, now measuring 3.8 cm, previously 3.5 cm. IMPRESSION: 1. No acute intracranial abnormality. Stable atrophy and chronic microvascular ischemic changes. 2. No acute cervical spine fracture. Stable multilevel degenerative changes. 3. Slight interval enlargement of the right thyroid mass, now measuring 3.8 cm, previously 3.5 cm. Recommend thyroid ultrasound and possible tissue sampling if not previously performed. Electronically Signed   By: Obie Dredge [name]   On: 05/01/2017 09:33   Ct Cervical Spine Wo Contrast  Result Date: 05/01/2017 CLINICAL DATA:  Found on the floor. EXAM: CT HEAD WITHOUT CONTRAST CT CERVICAL SPINE WITHOUT CONTRAST TECHNIQUE: Multidetector CT imaging of the head and cervical spine was  performed following the standard protocol without intravenous contrast. Multiplanar CT image reconstructions of the cervical spine were also generated. COMPARISON:  CT head and cervical spine dated January 07, 2016. FINDINGS: CT HEAD FINDINGS Brain: No evidence of acute infarction, hemorrhage, hydrocephalus, extra-axial collection or mass lesion/mass effect. Stable atrophy and chronic microvascular ischemic changes. Vascular: Calcified atherosclerosis at the skullbase. No hyperdense vessel. Skull: Negative for fracture or focal lesion. Sinuses/Orbits: No acute finding. Other: Stable left frontal scalp subcutaneous lesion. CT CERVICAL SPINE FINDINGS Alignment: No traumatic malalignment. Skull base and vertebrae: No acute fracture. No primary bone lesion or focal pathologic process. Soft tissues and spinal canal: No prevertebral fluid or  swelling. No visible canal hematoma. Disc levels: Moderate to severe degenerative disc disease and uncovertebral hypertrophy at C5-C6 and C6-C7, similar to prior study. Moderate to severe right-sided facet arthropathy throughout the cervical spine. Multilevel mild to moderate neuroforaminal stenosis. Upper chest: Mild centrilobular emphysema. Other: Slight interval enlargement of the right thyroid mass, now measuring 3.8 cm, previously 3.5 cm. IMPRESSION: 1. No acute intracranial abnormality. Stable atrophy and chronic microvascular ischemic changes. 2. No acute cervical spine fracture. Stable multilevel degenerative changes. 3. Slight interval enlargement of the right thyroid mass, now measuring 3.8 cm, previously 3.5 cm. Recommend thyroid ultrasound and possible tissue sampling if not previously performed. Electronically Signed   By: Obie Dredge [name]   On: 05/01/2017 09:33   Ct Pelvis Wo Contrast  Result Date: 05/01/2017 CLINICAL DATA:  Right hip pain after fall.  Evaluate for fracture. EXAM: CT PELVIS WITHOUT CONTRAST TECHNIQUE: Multidetector CT imaging of the pelvis was  performed following the standard protocol without intravenous contrast. COMPARISON:  Bilateral hip x-rays from same day. FINDINGS: Bones/Joint/Cartilage Prior right hip hemiarthroplasty. No hardware failure or loosening. Acute, nondisplaced fracture of the right inferior pubic ramus. No additional fracture seen. Normal alignment. No joint effusion. Mild left hip joint space narrowing. Advanced degenerative changes at L5-S1. Ligaments Ligaments are suboptimally evaluated by CT. Muscles and Tendons Atrophy of the right piriformis muscle. Soft tissue No fluid collection or hematoma.  No soft tissue mass. IMPRESSION: 1. Acute, nondisplaced fracture of the right inferior pubic ramus. 2. Prior right hip hemiarthroplasty without evidence of hardware complication. Electronically Signed   By: Obie Dredge [name]   On: 05/01/2017 11:49   Dg Humerus Right  Result Date: 05/01/2017 CLINICAL DATA:  Fall with right arm pain. EXAM: RIGHT HUMERUS - 2+ VIEW COMPARISON:  Right shoulder 08/25/2009 FINDINGS: Mild degenerate change of the [facility] joint. No evidence of fracture or dislocation. IMPRESSION: No acute findings. Electronically Signed   By: Elberta Fortis [name]   On: 05/01/2017 09:05   Dg Hips Bilat W Or Wo Pelvis 3-4 Views  Result Date: 05/01/2017 CLINICAL DATA:  Fall with left hip pain. EXAM: DG HIP (WITH OR WITHOUT PELVIS) 3-4V BILAT COMPARISON:  02/19/2013 FINDINGS: Right hip arthroplasty unchanged. Mild degenerate change of the left hip. No definite left hip fracture or dislocation. Mild focal irregularity over the right inferior pubic ramus which may represent a nondisplaced fracture. Degenerate change of the spine. IMPRESSION: No acute left hip findings. Focal cortical regularity over the right inferior pubic ramus as this may represent a nondisplaced fracture. Consider CT for further evaluation. Right hip arthroplasty unchanged. Electronically Signed   By: Elberta Fortis [name]   On: 05/01/2017 09:10     Procedures Procedures (including critical care time)  Medications Ordered in ED Medications  busPIRone (BUSPAR) tablet 10 mg (not administered)  furosemide (LASIX) tablet 20 mg (not administered)  insulin aspart (novoLOG) injection 0-9 Units (not administered)  lisinopril (PRINIVIL,ZESTRIL) tablet 5 mg (5 mg Oral Given 05/01/17 1512)  metoprolol tartrate (LOPRESSOR) tablet 12.5 mg (12.5 mg Oral Given 05/01/17 1512)  fentaNYL (SUBLIMAZE) injection 12.5 mcg (not administered)  bisacodyl (DULCOLAX) suppository 10 mg (not administered)  magnesium citrate solution 1 Bottle (not administered)  senna-docusate (Senokot-S) tablet 2 tablet (not administered)  acetaminophen (TYLENOL) tablet 1,000 mg (1,000 mg Oral Given 05/01/17 0916)  fentaNYL (SUBLIMAZE) injection 50 mcg (50 mcg Intravenous Given 05/01/17 1202)  0.9 %  sodium chloride infusion ( Intravenous New Bag/Given 05/01/17 1202)     Initial Impression /  Assessment and Plan / ED Course  I have reviewed the triage vital signs and the nursing notes.  Pertinent labs & imaging results that were available during my care of the patient were reviewed by me and considered in my medical decision making (see chart for details).     84 year old female here with mechanical fall after forgetting to use her walker.  She has exquisite right sided hip and pelvis pain.  Plain films and CT show inferior pubic ramus fracture.  She is having extreme difficulty ambulating due to pain.  No other apparent injuries.  Of note, she does have a mild leukocytosis.  No fevers or signs of sepsis.  Chest x-ray shows atelectasis but no pneumonia.  Will send a UA.  Given her worsening pain and difficulty ambulating, will admit for pain control.  Have also placed a consult to orthopedics though suspect this will be nonoperative.  Final Clinical Impressions(s) / ED Diagnoses   Final diagnoses:  Closed fracture of right inferior pubic ramus, initial encounter [facility](HCC)     ED Discharge Orders    None       Shaune PollackIsaacs, Luci Bellucci, MD 05/01/17 1553

## 2017-05-02 DIAGNOSIS — Z7901 Long term (current) use of anticoagulants: Secondary | ICD-10-CM

## 2017-05-02 DIAGNOSIS — S32591A Other specified fracture of right pubis, initial encounter for closed fracture: Principal | ICD-10-CM

## 2017-05-02 DIAGNOSIS — R413 Other amnesia: Secondary | ICD-10-CM | POA: Diagnosis not present

## 2017-05-02 DIAGNOSIS — J690 Pneumonitis due to inhalation of food and vomit: Secondary | ICD-10-CM | POA: Diagnosis not present

## 2017-05-02 DIAGNOSIS — I482 Chronic atrial fibrillation: Secondary | ICD-10-CM | POA: Diagnosis not present

## 2017-05-02 DIAGNOSIS — R296 Repeated falls: Secondary | ICD-10-CM

## 2017-05-02 DIAGNOSIS — D72829 Elevated white blood cell count, unspecified: Secondary | ICD-10-CM

## 2017-05-02 DIAGNOSIS — I10 Essential (primary) hypertension: Secondary | ICD-10-CM | POA: Diagnosis not present

## 2017-05-02 DIAGNOSIS — I25810 Atherosclerosis of coronary artery bypass graft(s) without angina pectoris: Secondary | ICD-10-CM | POA: Diagnosis not present

## 2017-05-02 LAB — BASIC METABOLIC PANEL
Anion gap: 12 (ref 5–15)
BUN: 12 mg/dL (ref 6–20)
CHLORIDE: 104 mmol/L (ref 101–111)
CO2: 21 mmol/L — AB (ref 22–32)
Calcium: 8.9 mg/dL (ref 8.9–10.3)
Creatinine, Ser: 0.77 mg/dL (ref 0.44–1.00)
GFR calc non Af Amer: >60 mL/min (ref >60)
Glucose, Bld: 147 mg/dL — ABNORMAL HIGH (ref 65–99)
POTASSIUM: 3.9 mmol/L (ref 3.5–5.1)
Sodium: 137 mmol/L (ref 135–145)

## 2017-05-02 LAB — CBC
HEMATOCRIT: 43.4 % (ref 36.0–46.0)
HEMOGLOBIN: 14.6 g/dL (ref 12.0–15.0)
MCH: 33 pg (ref 26.0–34.0)
MCHC: 33.6 g/dL (ref 30.0–36.0)
MCV: 98 fL (ref 78.0–100.0)
Platelets: 251 10*3/uL (ref 150–400)
RBC: 4.43 MIL/uL (ref 3.87–5.11)
RDW: 14.6 % (ref 11.5–15.5)
WBC: 15.6 10*3/uL — ABNORMAL HIGH (ref 4.0–10.5)

## 2017-05-02 LAB — GLUCOSE, CAPILLARY
GLUCOSE-CAPILLARY: 162 mg/dL — AB (ref 65–99)
GLUCOSE-CAPILLARY: 125 mg/dL — AB (ref 65–99)
GLUCOSE-CAPILLARY: 120 mg/dL — AB (ref 65–99)
Glucose-Capillary: 153 mg/dL — ABNORMAL HIGH (ref 65–99)

## 2017-05-02 MED ORDER — METOPROLOL TARTRATE 25 MG PO TABS
25.0000 mg | ORAL_TABLET | Freq: Two times a day (BID) | ORAL | Status: DC
  Administered 2017-05-02: 25 mg via ORAL
  Filled 2017-05-02: qty 1

## 2017-05-02 MED ORDER — ORAL CARE MOUTH RINSE
15.0000 mL | Freq: Two times a day (BID) | OROMUCOSAL | Status: DC
  Administered 2017-05-02: 15 mL via OROMUCOSAL

## 2017-05-02 MED ORDER — ENOXAPARIN SODIUM 40 MG/0.4ML ~~LOC~~ SOLN
40.0000 mg | SUBCUTANEOUS | Status: DC
  Administered 2017-05-02 – 2017-05-04 (×3): 40 mg via SUBCUTANEOUS
  Filled 2017-05-02 (×3): qty 0.4

## 2017-05-02 MED ORDER — TRAMADOL-ACETAMINOPHEN 37.5-325 MG PO TABS
1.0000 | ORAL_TABLET | Freq: Two times a day (BID) | ORAL | Status: DC
  Administered 2017-05-02 – 2017-05-04 (×5): 1 via ORAL
  Filled 2017-05-02 (×5): qty 1

## 2017-05-02 MED ORDER — METOPROLOL TARTRATE 12.5 MG HALF TABLET
12.5000 mg | ORAL_TABLET | Freq: Once | ORAL | Status: AC
  Administered 2017-05-02: 12.5 mg via ORAL
  Filled 2017-05-02: qty 1

## 2017-05-02 MED ORDER — OXYCODONE HCL 5 MG PO TABS
5.0000 mg | ORAL_TABLET | Freq: Four times a day (QID) | ORAL | Status: DC | PRN
  Administered 2017-05-03 – 2017-05-04 (×2): 5 mg via ORAL
  Filled 2017-05-02 (×2): qty 1

## 2017-05-02 MED ORDER — METOPROLOL TARTRATE 5 MG/5ML IV SOLN
2.5000 mg | Freq: Once | INTRAVENOUS | Status: AC
  Administered 2017-05-02: 2.5 mg via INTRAVENOUS
  Filled 2017-05-02: qty 5

## 2017-05-02 NOTE — Progress Notes (Addendum)
Patient ID: Yvonne GreavesBarbara S Butler, female   DOB: 1933-10-19, 84 y.o.   MRN: [id number]  PROGRESS NOTE    Yvonne GreavesBarbara S Butler  GMW:[id number] DOB: 1933-10-19 DOA: 05/01/2017 PCP: Merlene LaughterStoneking, Hal, MD   Brief Narrative:  84 year old female with dementia who lives in a memory care unit, coronary artery disease, atrial fibrillation not on anticoagulation currently due to recurrent falls probably, dementia, frequent falls presented with a fall and was found to have right inferior pubic ramus fracture.  Orthopedics was apparently consulted by ED provider.   Assessment & Plan:   Principal Problem:   Pubic ramus fracture (HCC) Active Problems:   Coronary atherosclerosis of autologous vein bypass graft   Leukocytosis   Memory loss   Atrial fibrillation (HCC)   Chronic anticoagulation   Falls frequently  RIGHT INFERIOR PUBIC RAMUS FRACTURE Orthopedics consult pending -Continue pain management. -PT/OT eval -Fall precautions  LEUKOCYTOSIS -Slightly improved since yesterday.   -Probably reactive -Patient became hypotensive yesterday after admission, unclear if it was secondary to pain medications.  She was empirically started on broad-spectrum antibiotics.  Doubt that there is any evidence of bacterial infection.  Will discontinue antibiotics tomorrow if white count keeps improving and there is still no source of bacterial infection.  FALL -Patient with multiple histories of falls in the past with a negative workup. Head CT here is negative for intracranial hemorrhage.  DM type II -Oral meds on hold.  Accu-Cheks with insulin sliding scale  THYROID MASS -Follow-up as an outpatient as warranted  DEMENTIA -Continue Aricept, buspirone  CHRONIC AFIB -increase metoprolol to 25mg  BID.  - not on anticoagulation currently probably due to history of recurrent falls  CAD -On aspirin, metoprolol and pravastatin  HYPERTENSION -Blood pressure on the higher side. Increase metoprolol to 25 mg twice  daily   DVT prophylaxis: start lovenox Code Status: DNR Family Communication: Spoke to daughter at bedside Disposition Plan: Care unit versus skilled nursing facility  Consultants: OrthopedicS Procedures: None  Antimicrobials: Vancomycin and Zosyn from 05/01/2017 onwards   Subjective: Patient seen and examined at bedside.  She is awake but confused.  No overnight fever or vomiting.   Objective: Vitals:   05/01/17 2036 05/01/17 2308 05/02/17 0625 05/02/17 0938  BP: (!) 173/99 (!) 159/85 (!) 168/93 (!) 182/82  Pulse: (!) 116 89 (!) 121 (!) 105  Resp:      Temp: 98.4 F (36.9 C)  99.7 F (37.6 C)   TempSrc: Oral  Oral   SpO2: 91% 99% 94%   Weight:      Height:        Intake/Output Summary (Last 24 hours) at 05/02/2017 1008 Last data filed at 05/02/2017 0947 Gross per 24 hour  Intake 1647.5 ml  Output 500 ml  Net 1147.5 ml   Filed Weights   05/01/17 0800  Weight: 59 kg (130 lb)    Examination:  General exam: Elderly female lying in bed.  Confused but awake Respiratory system: Bilateral decreased breath sound at bases Cardiovascular system: S1 & S2 heard, tachycardic  gastrointestinal system: Abdomen is nondistended, soft and nontender. Normal bowel sounds heard. Central nervous system: Confused. No focal neurological deficits. Moving extremities Extremities: No cyanosis, clubbing; trace edema Skin: No rashes, lesions or ulcers Lymph: No cervical lymphadenopathy    Data Reviewed: I have personally reviewed following labs and imaging studies  CBC: Recent Labs  Lab 05/01/17 1115 05/01/17 1919 05/02/17 0338  WBC 16.5* 14.8* 15.6*  NEUTROABS 14.1*  --   --   HGB  14.7 15.0 14.6  HCT 43.6 44.4 43.4  MCV 98.0 98.0 98.0  PLT 239 250 251   Basic Metabolic Panel: Recent Labs  Lab 05/01/17 1115 05/02/17 0338  NA 138 137  K 4.0 3.9  CL 104 104  CO2 22 21*  GLUCOSE 157* 147*  BUN 18 12  CREATININE 0.83 0.77  CALCIUM 9.4 8.9   GFR: Estimated Creatinine  Clearance: 43.6 mL/min (by C-G formula based on SCr of 0.77 mg/dL). Liver Function Tests: No results for input(s): AST, ALT, ALKPHOS, BILITOT, PROT, ALBUMIN in the last 168 hours. No results for input(s): LIPASE, AMYLASE in the last 168 hours. No results for input(s): AMMONIA in the last 168 hours. Coagulation Profile: No results for input(s): INR, PROTIME in the last 168 hours. Cardiac Enzymes: No results for input(s): CKTOTAL, CKMB, CKMBINDEX, TROPONINI in the last 168 hours. BNP (last 3 results) No results for input(s): PROBNP in the last 8760 hours. HbA1C: No results for input(s): HGBA1C in the last 72 hours. CBG: Recent Labs  Lab 05/01/17 1718 05/02/17 0741  GLUCAP 164* 162*   Lipid Profile: No results for input(s): CHOL, HDL, LDLCALC, TRIG, CHOLHDL, LDLDIRECT in the last 72 hours. Thyroid Function Tests: No results for input(s): TSH, T4TOTAL, FREET4, T3FREE, THYROIDAB in the last 72 hours. Anemia Panel: No results for input(s): VITAMINB12, FOLATE, FERRITIN, TIBC, IRON, RETICCTPCT in the last 72 hours. Sepsis Labs: No results for input(s): PROCALCITON, LATICACIDVEN in the last 168 hours.  Recent Results (from the past 240 hour(s))  MRSA PCR Screening     Status: None   Collection Time: 05/01/17  6:00 PM  Result Value Ref Range Status   MRSA by PCR NEGATIVE NEGATIVE Final    Comment:        The GeneXpert MRSA Assay (FDA approved for NASAL specimens only), is one component of a comprehensive MRSA colonization surveillance program. It is not intended to diagnose MRSA infection nor to guide or monitor treatment for MRSA infections. Performed at [facility], [address] [place], [place] [address]          Radiology Studies: Dg Chest 2 View  Result Date: 05/01/2017 CLINICAL DATA:  Fall, pain, initial encounter. EXAM: CHEST  2 VIEW COMPARISON:  06/03/2015. FINDINGS: Trachea is deviated slightly to the left, which may be due to right thyroid enlargement.  Heart is enlarged. Thoracic aorta is calcified. Biapical pleural scarring. Lungs are somewhat low in volume with mild atelectasis at the left lung base. Tiny pleural effusion on the lateral view is difficult to localize on the frontal view. Visualized osseous structures appear grossly intact. IMPRESSION: 1. Left basilar atelectasis. 2. Tiny pleural effusion, difficult to localize on the frontal view. Electronically Signed   By: Leanna Battles [name]   On: 05/01/2017 09:06   Ct Head Wo Contrast  Result Date: 05/01/2017 CLINICAL DATA:  Found on the floor. EXAM: CT HEAD WITHOUT CONTRAST CT CERVICAL SPINE WITHOUT CONTRAST TECHNIQUE: Multidetector CT imaging of the head and cervical spine was performed following the standard protocol without intravenous contrast. Multiplanar CT image reconstructions of the cervical spine were also generated. COMPARISON:  CT head and cervical spine dated January 07, 2016. FINDINGS: CT HEAD FINDINGS Brain: No evidence of acute infarction, hemorrhage, hydrocephalus, extra-axial collection or mass lesion/mass effect. Stable atrophy and chronic microvascular ischemic changes. Vascular: Calcified atherosclerosis at the skullbase. No hyperdense vessel. Skull: Negative for fracture or focal lesion. Sinuses/Orbits: No acute finding. Other: Stable left frontal scalp subcutaneous lesion. CT  CERVICAL SPINE FINDINGS Alignment: No traumatic malalignment. Skull base and vertebrae: No acute fracture. No primary bone lesion or focal pathologic process. Soft tissues and spinal canal: No prevertebral fluid or swelling. No visible canal hematoma. Disc levels: Moderate to severe degenerative disc disease and uncovertebral hypertrophy at C5-C6 and C6-C7, similar to prior study. Moderate to severe right-sided facet arthropathy throughout the cervical spine. Multilevel mild to moderate neuroforaminal stenosis. Upper chest: Mild centrilobular emphysema. Other: Slight interval enlargement of the right thyroid  mass, now measuring 3.8 cm, previously 3.5 cm. IMPRESSION: 1. No acute intracranial abnormality. Stable atrophy and chronic microvascular ischemic changes. 2. No acute cervical spine fracture. Stable multilevel degenerative changes. 3. Slight interval enlargement of the right thyroid mass, now measuring 3.8 cm, previously 3.5 cm. Recommend thyroid ultrasound and possible tissue sampling if not previously performed. Electronically Signed   By: Obie Dredge [name]   On: 05/01/2017 09:33   Ct Cervical Spine Wo Contrast  Result Date: 05/01/2017 CLINICAL DATA:  Found on the floor. EXAM: CT HEAD WITHOUT CONTRAST CT CERVICAL SPINE WITHOUT CONTRAST TECHNIQUE: Multidetector CT imaging of the head and cervical spine was performed following the standard protocol without intravenous contrast. Multiplanar CT image reconstructions of the cervical spine were also generated. COMPARISON:  CT head and cervical spine dated January 07, 2016. FINDINGS: CT HEAD FINDINGS Brain: No evidence of acute infarction, hemorrhage, hydrocephalus, extra-axial collection or mass lesion/mass effect. Stable atrophy and chronic microvascular ischemic changes. Vascular: Calcified atherosclerosis at the skullbase. No hyperdense vessel. Skull: Negative for fracture or focal lesion. Sinuses/Orbits: No acute finding. Other: Stable left frontal scalp subcutaneous lesion. CT CERVICAL SPINE FINDINGS Alignment: No traumatic malalignment. Skull base and vertebrae: No acute fracture. No primary bone lesion or focal pathologic process. Soft tissues and spinal canal: No prevertebral fluid or swelling. No visible canal hematoma. Disc levels: Moderate to severe degenerative disc disease and uncovertebral hypertrophy at C5-C6 and C6-C7, similar to prior study. Moderate to severe right-sided facet arthropathy throughout the cervical spine. Multilevel mild to moderate neuroforaminal stenosis. Upper chest: Mild centrilobular emphysema. Other: Slight interval  enlargement of the right thyroid mass, now measuring 3.8 cm, previously 3.5 cm. IMPRESSION: 1. No acute intracranial abnormality. Stable atrophy and chronic microvascular ischemic changes. 2. No acute cervical spine fracture. Stable multilevel degenerative changes. 3. Slight interval enlargement of the right thyroid mass, now measuring 3.8 cm, previously 3.5 cm. Recommend thyroid ultrasound and possible tissue sampling if not previously performed. Electronically Signed   By: Obie Dredge [name]   On: 05/01/2017 09:33   Ct Pelvis Wo Contrast  Result Date: 05/01/2017 CLINICAL DATA:  Right hip pain after fall.  Evaluate for fracture. EXAM: CT PELVIS WITHOUT CONTRAST TECHNIQUE: Multidetector CT imaging of the pelvis was performed following the standard protocol without intravenous contrast. COMPARISON:  Bilateral hip x-rays from same day. FINDINGS: Bones/Joint/Cartilage Prior right hip hemiarthroplasty. No hardware failure or loosening. Acute, nondisplaced fracture of the right inferior pubic ramus. No additional fracture seen. Normal alignment. No joint effusion. Mild left hip joint space narrowing. Advanced degenerative changes at L5-S1. Ligaments Ligaments are suboptimally evaluated by CT. Muscles and Tendons Atrophy of the right piriformis muscle. Soft tissue No fluid collection or hematoma.  No soft tissue mass. IMPRESSION: 1. Acute, nondisplaced fracture of the right inferior pubic ramus. 2. Prior right hip hemiarthroplasty without evidence of hardware complication. Electronically Signed   By: Obie Dredge [name]   On: 05/01/2017 11:49   Dg Humerus Right  Result Date: 05/01/2017 CLINICAL  DATA:  Fall with right arm pain. EXAM: RIGHT HUMERUS - 2+ VIEW COMPARISON:  Right shoulder 08/25/2009 FINDINGS: Mild degenerate change of the [facility] joint. No evidence of fracture or dislocation. IMPRESSION: No acute findings. Electronically Signed   By: Elberta Fortis [name]   On: 05/01/2017 09:05   Dg Hips Bilat W Or Wo  Pelvis 3-4 Views  Result Date: 05/01/2017 CLINICAL DATA:  Fall with left hip pain. EXAM: DG HIP (WITH OR WITHOUT PELVIS) 3-4V BILAT COMPARISON:  02/19/2013 FINDINGS: Right hip arthroplasty unchanged. Mild degenerate change of the left hip. No definite left hip fracture or dislocation. Mild focal irregularity over the right inferior pubic ramus which may represent a nondisplaced fracture. Degenerate change of the spine. IMPRESSION: No acute left hip findings. Focal cortical regularity over the right inferior pubic ramus as this may represent a nondisplaced fracture. Consider CT for further evaluation. Right hip arthroplasty unchanged. Electronically Signed   By: Elberta Fortis [name]   On: 05/01/2017 09:10        Scheduled Meds: . busPIRone  10 mg Oral BID  . insulin aspart  0-9 Units Subcutaneous TID WC  . metoprolol tartrate  12.5 mg Oral BID  . senna-docusate  2 tablet Oral QHS   Continuous Infusions: . sodium chloride 150 mL/hr at 05/01/17 2056  . piperacillin-tazobactam (ZOSYN)  IV Stopped (05/02/17 0946)  . vancomycin Stopped (05/02/17 0829)     LOS: 0 days        Glade Lloyd, MD [facility] Pager [phone]  If 7PM-7AM, please contact night-coverage [email or web address] Password [email or web address] 05/02/2017, 10:08 AM

## 2017-05-02 NOTE — Progress Notes (Signed)
OT Cancellation    05/02/17 1400  OT Visit Information  Last OT Received On 05/02/17  Reason Eval/Treat Not Completed Medical issues which prohibited therapy. Awaiting Orthopedic consult to determine weightbearing status before OT evaluation.  Will return as schedule allows. Thank you.   Tan Clopper MSOT, OTR/L Acute Rehab Pager: [phone] Office: [phone]

## 2017-05-02 NOTE — Progress Notes (Signed)
PT Cancellation Note  Patient Details Name: Yvonne GreavesBarbara S Yanni MRN: [id number] DOB: March 28, 1933   Cancelled Treatment:    Reason Eval/Treat Not Completed: (P) Medical issues which prohibited therapy Awaiting Orthopedic consult to determine weightbearing status before evaluation. PT will follow back once consult completed.  [name] B. Beverely RisenVan Fleet PT, DPT Acute Rehabilitation  [phone] Pager [phone]     [name] 05/02/2017, 11:17 AM

## 2017-05-02 NOTE — Consult Note (Signed)
ORTHOPAEDIC CONSULTATION  REQUESTING PHYSICIAN: Aline August, MD  Chief Complaint: R hip pain  HPI: Yvonne Butler is a 84 y.o. female with  Severe dementia with fall resulting in R groin pain.  History of hemiarthroplasty in past.  Patient was found on Ct to have right sided ramus fracture.  Walks minimally but with walker at baseline.  Also complains of some R shoulder pain but no other injuries noted.  Primary historian is husband.  Lives in cognitive impairment facility at baseline.  Past Medical History:  Diagnosis Date  . CAD (coronary artery disease)    with CABG- Dr. Tamala Julian  . Complication of anesthesia   . Coronary atherosclerosis of native coronary artery   . Diabetes ([place])   . Diverticulosis   . Falls frequently 05/01/2017  . Hypercholesteremia   . Hyperlipidemia   . Osteopenia   . PONV (postoperative nausea and vomiting)   . Vitamin D deficiency    Past Surgical History:  Procedure Laterality Date  . APPENDECTOMY    . CORONARY ARTERY BYPASS GRAFT      in with LIMA to LAD, SVG to the circumflex, SVG to the diagonal, and SVG to the PDA.  [name] HIP ARTHROPLASTY Right 02/19/2013   Procedure: HEMI ARTHROPLASTY BIPOLAR HIP CEMENTED VS UNCEMENTED;  Surgeon: Alta Corning, MD;  Location: [facility];  Service: Orthopedics;  Laterality: Right;  . HIP SURGERY Right 02/20/2013   HEMI -BIPOLAR ARTHROPLASTY  . Ovarian Cyst & Fallopian Tubes     Social History   Socioeconomic History  . Marital status: Married    Spouse name: None  . Number of children: None  . Years of education: None  . Highest education level: None  Social Needs  . Financial resource strain: None  . Food insecurity - worry: None  . Food insecurity - inability: None  . Transportation needs - medical: None  . Transportation needs - non-medical: None  Occupational History  . None  Tobacco Use  . Smoking status: Former Smoker    Types: Cigarettes  . Smokeless tobacco: Never Used  Substance and  Sexual Activity  . Alcohol use: No  . Drug use: No  . Sexual activity: No  Other Topics Concern  . None  Social History Narrative  . None   Family History  Problem Relation Age of Onset  . Heart disease Father    No Known Allergies Prior to Admission medications   Medication Sig Start Date End Date Taking? Authorizing Provider  busPIRone (BUSPAR) 10 MG tablet Take 10 mg by mouth 2 (two) times daily. 11/22/16  Yes [provider]  camphor-menthol Timoteo Ace) lotion Apply 1 application topically 3 (three) times daily.   Yes [provider]  furosemide (LASIX) 20 MG tablet Take 1 tablet by mouth daily. 03/25/15  Yes [provider]  glimepiride (AMARYL) 1 MG tablet Take 1 mg by mouth See admin instructions. Taking one tablet with breakfast and 1 tablet with lunch daily 04/23/17  Yes [provider]  guaiFENesin (ROBITUSSIN) 100 MG/5ML SOLN Take 10 mLs by mouth every 6 (six) hours as needed for cough or to loosen phlegm.   Yes [provider]  hydrocortisone 2.5 % cream Apply 1 application topically daily as needed for itching. 03/23/17  Yes [provider]  hydrOXYzine (ATARAX/VISTARIL) 10 MG tablet Take 10 mg by mouth 3 (three) times daily. 11/20/16  Yes [provider]  lisinopril (PRINIVIL,ZESTRIL) 5 MG tablet Take 5 mg by mouth  daily. 04/23/17  Yes [provider]  metoprolol (LOPRESSOR) 25 MG tablet Take 1 tablet (25 mg total) by mouth 2 (two) times daily. Patient taking differently: Take 12.5 mg by mouth 2 (two) times daily.  04/18/15  Yes Belva Crome, MD  Multiple Vitamin (MULTIVITAMIN) tablet Take 1 tablet by mouth daily.   Yes [provider]  Multiple Vitamins-Minerals (PRESERVISION AREDS 2) CAPS Take 1 capsule by mouth 2 (two) times daily.   Yes [provider]  traMADol-acetaminophen (ULTRACET) 37.5-325 MG tablet Take 1 tablet by mouth 2 (two) times daily. 11/24/16  Yes [provider]   Dg Chest  2 View  Result Date: 05/01/2017 CLINICAL DATA:  Fall, pain, initial encounter. EXAM: CHEST  2 VIEW COMPARISON:  06/03/2015. FINDINGS: Trachea is deviated slightly to the left, which may be due to right thyroid enlargement. Heart is enlarged. Thoracic aorta is calcified. Biapical pleural scarring. Lungs are somewhat low in volume with mild atelectasis at the left lung base. Tiny pleural effusion on the lateral view is difficult to localize on the frontal view. Visualized osseous structures appear grossly intact. IMPRESSION: 1. Left basilar atelectasis. 2. Tiny pleural effusion, difficult to localize on the frontal view. Electronically Signed   By: Lorin Picket [name]   On: 05/01/2017 09:06   Ct Head Wo Contrast  Result Date: 05/01/2017 CLINICAL DATA:  Found on the floor. EXAM: CT HEAD WITHOUT CONTRAST CT CERVICAL SPINE WITHOUT CONTRAST TECHNIQUE: Multidetector CT imaging of the head and cervical spine was performed following the standard protocol without intravenous contrast. Multiplanar CT image reconstructions of the cervical spine were also generated. COMPARISON:  CT head and cervical spine dated January 07, 2016. FINDINGS: CT HEAD FINDINGS Brain: No evidence of acute infarction, hemorrhage, hydrocephalus, extra-axial collection or mass lesion/mass effect. Stable atrophy and chronic microvascular ischemic changes. Vascular: Calcified atherosclerosis at the skullbase. No hyperdense vessel. Skull: Negative for fracture or focal lesion. Sinuses/Orbits: No acute finding. Other: Stable left frontal scalp subcutaneous lesion. CT CERVICAL SPINE FINDINGS Alignment: No traumatic malalignment. Skull base and vertebrae: No acute fracture. No primary bone lesion or focal pathologic process. Soft tissues and spinal canal: No prevertebral fluid or swelling. No visible canal hematoma. Disc levels: Moderate to severe degenerative disc disease and uncovertebral hypertrophy at C5-C6 and C6-C7, similar to prior study.  Moderate to severe right-sided facet arthropathy throughout the cervical spine. Multilevel mild to moderate neuroforaminal stenosis. Upper chest: Mild centrilobular emphysema. Other: Slight interval enlargement of the right thyroid mass, now measuring 3.8 cm, previously 3.5 cm. IMPRESSION: 1. No acute intracranial abnormality. Stable atrophy and chronic microvascular ischemic changes. 2. No acute cervical spine fracture. Stable multilevel degenerative changes. 3. Slight interval enlargement of the right thyroid mass, now measuring 3.8 cm, previously 3.5 cm. Recommend thyroid ultrasound and possible tissue sampling if not previously performed. Electronically Signed   By: Titus Dubin [name]   On: 05/01/2017 09:33   Ct Cervical Spine Wo Contrast  Result Date: 05/01/2017 CLINICAL DATA:  Found on the floor. EXAM: CT HEAD WITHOUT CONTRAST CT CERVICAL SPINE WITHOUT CONTRAST TECHNIQUE: Multidetector CT imaging of the head and cervical spine was performed following the standard protocol without intravenous contrast. Multiplanar CT image reconstructions of the cervical spine were also generated. COMPARISON:  CT head and cervical spine dated January 07, 2016. FINDINGS: CT HEAD FINDINGS Brain: No evidence of acute infarction, hemorrhage, hydrocephalus, extra-axial collection or mass lesion/mass effect. Stable atrophy and chronic microvascular ischemic changes. Vascular: Calcified atherosclerosis at the skullbase.  No hyperdense vessel. Skull: Negative for fracture or focal lesion. Sinuses/Orbits: No acute finding. Other: Stable left frontal scalp subcutaneous lesion. CT CERVICAL SPINE FINDINGS Alignment: No traumatic malalignment. Skull base and vertebrae: No acute fracture. No primary bone lesion or focal pathologic process. Soft tissues and spinal canal: No prevertebral fluid or swelling. No visible canal hematoma. Disc levels: Moderate to severe degenerative disc disease and uncovertebral hypertrophy at C5-C6 and C6-C7,  similar to prior study. Moderate to severe right-sided facet arthropathy throughout the cervical spine. Multilevel mild to moderate neuroforaminal stenosis. Upper chest: Mild centrilobular emphysema. Other: Slight interval enlargement of the right thyroid mass, now measuring 3.8 cm, previously 3.5 cm. IMPRESSION: 1. No acute intracranial abnormality. Stable atrophy and chronic microvascular ischemic changes. 2. No acute cervical spine fracture. Stable multilevel degenerative changes. 3. Slight interval enlargement of the right thyroid mass, now measuring 3.8 cm, previously 3.5 cm. Recommend thyroid ultrasound and possible tissue sampling if not previously performed. Electronically Signed   By: Titus Dubin [name]   On: 05/01/2017 09:33   Ct Pelvis Wo Contrast  Result Date: 05/01/2017 CLINICAL DATA:  Right hip pain after fall.  Evaluate for fracture. EXAM: CT PELVIS WITHOUT CONTRAST TECHNIQUE: Multidetector CT imaging of the pelvis was performed following the standard protocol without intravenous contrast. COMPARISON:  Bilateral hip x-rays from same day. FINDINGS: Bones/Joint/Cartilage Prior right hip hemiarthroplasty. No hardware failure or loosening. Acute, nondisplaced fracture of the right inferior pubic ramus. No additional fracture seen. Normal alignment. No joint effusion. Mild left hip joint space narrowing. Advanced degenerative changes at L5-S1. Ligaments Ligaments are suboptimally evaluated by CT. Muscles and Tendons Atrophy of the right piriformis muscle. Soft tissue No fluid collection or hematoma.  No soft tissue mass. IMPRESSION: 1. Acute, nondisplaced fracture of the right inferior pubic ramus. 2. Prior right hip hemiarthroplasty without evidence of hardware complication. Electronically Signed   By: Titus Dubin [name]   On: 05/01/2017 11:49   Dg Humerus Right  Result Date: 05/01/2017 CLINICAL DATA:  Fall with right arm pain. EXAM: RIGHT HUMERUS - 2+ VIEW COMPARISON:  Right shoulder  08/25/2009 FINDINGS: Mild degenerate change of the [facility] joint. No evidence of fracture or dislocation. IMPRESSION: No acute findings. Electronically Signed   By: Marin Olp [name]   On: 05/01/2017 09:05   Dg Hips Bilat W Or Wo Pelvis 3-4 Views  Result Date: 05/01/2017 CLINICAL DATA:  Fall with left hip pain. EXAM: DG HIP (WITH OR WITHOUT PELVIS) 3-4V BILAT COMPARISON:  02/19/2013 FINDINGS: Right hip arthroplasty unchanged. Mild degenerate change of the left hip. No definite left hip fracture or dislocation. Mild focal irregularity over the right inferior pubic ramus which may represent a nondisplaced fracture. Degenerate change of the spine. IMPRESSION: No acute left hip findings. Focal cortical regularity over the right inferior pubic ramus as this may represent a nondisplaced fracture. Consider CT for further evaluation. Right hip arthroplasty unchanged. Electronically Signed   By: Marin Olp [name]   On: 05/01/2017 09:10   Family History Reviewed and non-contributory, no pertinent history of problems with bleeding or anesthesia      Review of Systems 14 system ROS conducted and negative except for that noted in HPI   OBJECTIVE  Vitals: Patient Vitals for the past 8 hrs:  BP Temp Temp src Pulse Resp SpO2  05/02/17 1500 (!) 169/75 98.3 F (36.8 C) Oral 81 17 93 %  05/02/17 1034 (!) 173/85 - - (!) 101 - -  05/02/17 0938 (!) 182/82 - - [name])  105 - -   General: Alert, no acute distress Cardiovascular: No pedal edema Respiratory: No cyanosis, no use of accessory musculature GI: No organomegaly, abdomen is soft and non-tender Skin: No lesions in the area of chief complaint other than those listed below in MSK exam.  Neurologic: Sensation intact distally save for the below mentioned MSK exam Psychiatric: Patient is at baseline confused, no complaints though Lymphatic: No axillary or cervical lymphadenopathy Extremities  KGY:BNLWHK to fully range shoulder but passive motion without pain.   Demonstrates grip but difficult to assess further sensation and motor due to dementia. RLE: no pain with logroll, no pain with gentle hip flexion, wwp distally, +TA/GSC/EHL    Test Results Imaging XR of humerus and XR and CT pelvis reviewed.  Humerus XR without abnormality, CT pelvis notes R sided non-displaced ramus fracture  Labs cbc Recent Labs    05/01/17 1919 05/02/17 0338  WBC 14.8* 15.6*  HGB 15.0 14.6  HCT 44.4 43.4  PLT 250 251    Labs inflam No results for input(s): CRP in the last 72 hours.  Invalid input(s): ESR  Labs coag No results for input(s): INR, PTT in the last 72 hours.  Invalid input(s): PT  Recent Labs    05/01/17 1115 05/02/17 0338  NA 138 137  K 4.0 3.9  CL 104 104  CO2 22 21*  GLUCOSE 157* 147*  BUN 18 12  CREATININE 0.83 0.77  CALCIUM 9.4 8.9     ASSESSMENT AND PLAN: 84 y.o. female with the following: R pubic ramus fracture and R shoulder contusion likely  WBAT RUE and RLE, recommend repeat evaluation for R shoulder if not improving with time.   - Additional recommended labs/tests: None  -VTE Prophylaxis: Per primary  - Pain control: per primary  - Follow-up plan: 3-4 weeks with myself  -Procedures: none  Signing off inpatient.

## 2017-05-03 ENCOUNTER — Observation Stay (HOSPITAL_COMMUNITY): Payer: Medicare Other

## 2017-05-03 DIAGNOSIS — E785 Hyperlipidemia, unspecified: Secondary | ICD-10-CM | POA: Diagnosis not present

## 2017-05-03 DIAGNOSIS — R0902 Hypoxemia: Secondary | ICD-10-CM | POA: Diagnosis not present

## 2017-05-03 DIAGNOSIS — R296 Repeated falls: Secondary | ICD-10-CM | POA: Diagnosis present

## 2017-05-03 DIAGNOSIS — I10 Essential (primary) hypertension: Secondary | ICD-10-CM

## 2017-05-03 DIAGNOSIS — Y92129 Unspecified place in nursing home as the place of occurrence of the external cause: Secondary | ICD-10-CM | POA: Diagnosis not present

## 2017-05-03 DIAGNOSIS — F0390 Unspecified dementia without behavioral disturbance: Secondary | ICD-10-CM | POA: Diagnosis not present

## 2017-05-03 DIAGNOSIS — W010XXA Fall on same level from slipping, tripping and stumbling without subsequent striking against object, initial encounter: Secondary | ICD-10-CM | POA: Diagnosis not present

## 2017-05-03 DIAGNOSIS — I25810 Atherosclerosis of coronary artery bypass graft(s) without angina pectoris: Secondary | ICD-10-CM | POA: Diagnosis not present

## 2017-05-03 DIAGNOSIS — M8580 Other specified disorders of bone density and structure, unspecified site: Secondary | ICD-10-CM | POA: Diagnosis not present

## 2017-05-03 DIAGNOSIS — E119 Type 2 diabetes mellitus without complications: Secondary | ICD-10-CM | POA: Diagnosis not present

## 2017-05-03 DIAGNOSIS — E079 Disorder of thyroid, unspecified: Secondary | ICD-10-CM | POA: Diagnosis not present

## 2017-05-03 DIAGNOSIS — I2510 Atherosclerotic heart disease of native coronary artery without angina pectoris: Secondary | ICD-10-CM | POA: Diagnosis not present

## 2017-05-03 DIAGNOSIS — E7800 Pure hypercholesterolemia, unspecified: Secondary | ICD-10-CM | POA: Diagnosis not present

## 2017-05-03 DIAGNOSIS — Z8249 Family history of ischemic heart disease and other diseases of the circulatory system: Secondary | ICD-10-CM | POA: Diagnosis not present

## 2017-05-03 DIAGNOSIS — S40011A Contusion of right shoulder, initial encounter: Secondary | ICD-10-CM | POA: Diagnosis not present

## 2017-05-03 DIAGNOSIS — Z96641 Presence of right artificial hip joint: Secondary | ICD-10-CM | POA: Diagnosis not present

## 2017-05-03 DIAGNOSIS — K5790 Diverticulosis of intestine, part unspecified, without perforation or abscess without bleeding: Secondary | ICD-10-CM | POA: Diagnosis not present

## 2017-05-03 DIAGNOSIS — Z66 Do not resuscitate: Secondary | ICD-10-CM | POA: Diagnosis not present

## 2017-05-03 DIAGNOSIS — I959 Hypotension, unspecified: Secondary | ICD-10-CM | POA: Diagnosis not present

## 2017-05-03 DIAGNOSIS — I482 Chronic atrial fibrillation: Secondary | ICD-10-CM | POA: Diagnosis not present

## 2017-05-03 DIAGNOSIS — E559 Vitamin D deficiency, unspecified: Secondary | ICD-10-CM | POA: Diagnosis not present

## 2017-05-03 DIAGNOSIS — Z7901 Long term (current) use of anticoagulants: Secondary | ICD-10-CM | POA: Diagnosis not present

## 2017-05-03 DIAGNOSIS — S32591A Other specified fracture of right pubis, initial encounter for closed fracture: Secondary | ICD-10-CM | POA: Diagnosis not present

## 2017-05-03 DIAGNOSIS — Z87891 Personal history of nicotine dependence: Secondary | ICD-10-CM | POA: Diagnosis not present

## 2017-05-03 LAB — CBC WITH DIFFERENTIAL/PLATELET
BASOS ABS: 0 10*3/uL (ref 0.0–0.1)
BASOS PCT: 0 %
EOS ABS: 0.2 10*3/uL (ref 0.0–0.7)
Eosinophils Relative: 1 %
HCT: 43.6 % (ref 36.0–46.0)
Hemoglobin: 14.8 g/dL (ref 12.0–15.0)
Lymphocytes Relative: 6 %
Lymphs Abs: 1 10*3/uL (ref 0.7–4.0)
MCH: 33.2 pg (ref 26.0–34.0)
MCHC: 33.9 g/dL (ref 30.0–36.0)
MCV: 97.8 fL (ref 78.0–100.0)
Monocytes Absolute: 1.1 10*3/uL — ABNORMAL HIGH (ref 0.1–1.0)
Monocytes Relative: 7 %
Neutro Abs: 14.2 10*3/uL — ABNORMAL HIGH (ref 1.7–7.7)
Neutrophils Relative %: 86 %
Platelets: 204 10*3/uL (ref 150–400)
RBC: 4.46 MIL/uL (ref 3.87–5.11)
RDW: 14.8 % (ref 11.5–15.5)
WBC: 16.5 10*3/uL — AB (ref 4.0–10.5)

## 2017-05-03 LAB — BASIC METABOLIC PANEL
Anion gap: 12 (ref 5–15)
BUN: 15 mg/dL (ref 6–20)
CHLORIDE: 102 mmol/L (ref 101–111)
CO2: 20 mmol/L — ABNORMAL LOW (ref 22–32)
Calcium: 9 mg/dL (ref 8.9–10.3)
Creatinine, Ser: 0.93 mg/dL (ref 0.44–1.00)
GFR calc non Af Amer: 56 mL/min — ABNORMAL LOW (ref >60)
Glucose, Bld: 193 mg/dL — ABNORMAL HIGH (ref 65–99)
POTASSIUM: 3.8 mmol/L (ref 3.5–5.1)
SODIUM: 134 mmol/L — AB (ref 135–145)

## 2017-05-03 LAB — GLUCOSE, CAPILLARY
GLUCOSE-CAPILLARY: 116 mg/dL — AB (ref 65–99)
Glucose-Capillary: 117 mg/dL — ABNORMAL HIGH (ref 65–99)
Glucose-Capillary: 140 mg/dL — ABNORMAL HIGH (ref 65–99)
Glucose-Capillary: 108 mg/dL — ABNORMAL HIGH (ref 65–99)

## 2017-05-03 LAB — MAGNESIUM: MAGNESIUM: 1.8 mg/dL (ref 1.7–2.4)

## 2017-05-03 MED ORDER — AMLODIPINE BESYLATE 5 MG PO TABS
5.0000 mg | ORAL_TABLET | Freq: Every day | ORAL | Status: DC
  Administered 2017-05-03 – 2017-05-04 (×2): 5 mg via ORAL
  Filled 2017-05-03 (×2): qty 1

## 2017-05-03 MED ORDER — METOPROLOL TARTRATE 50 MG PO TABS
50.0000 mg | ORAL_TABLET | Freq: Two times a day (BID) | ORAL | Status: DC
  Administered 2017-05-03 – 2017-05-04 (×3): 50 mg via ORAL
  Filled 2017-05-03 (×3): qty 1

## 2017-05-03 MED ORDER — FUROSEMIDE 10 MG/ML IJ SOLN
60.0000 mg | Freq: Once | INTRAMUSCULAR | Status: AC
  Administered 2017-05-03: 60 mg via INTRAVENOUS
  Filled 2017-05-03: qty 6

## 2017-05-03 MED ORDER — HYDRALAZINE HCL 20 MG/ML IJ SOLN
10.0000 mg | Freq: Four times a day (QID) | INTRAMUSCULAR | Status: DC | PRN
  Administered 2017-05-03: 10 mg via INTRAVENOUS
  Filled 2017-05-03: qty 1

## 2017-05-03 NOTE — Evaluation (Signed)
Occupational Therapy Evaluation Patient Details Name: Yvonne Butler MRN: [id number] DOB: [birth year] Today's Date: 05/03/2017    History of Present Illness 84 year old female with dementia who lives in a memory care unit, coronary artery disease, atrial fibrillation not on anticoagulation currently, hx of hip hemiarthroplasty, presented with a fall and was found to have R shoulder contusion, right inferior pubic ramus fracture, being treated non-operatively.    Clinical Impression   This 84 y/o F presents with the above. Pt lives in memory care unit at baseline, was using RW for functional mobility and receiving assist for bathing/dressing ADLs. Pt completing bed mobility and sit<>stand from EOB this session with overall MaxA+2; completing grooming ADLs with setup/minguard assist for static sitting EOB. Currently requires max-totalA for LB ADLs. Pt will benefit from continued acute OT services and recommend additional OT services in SNF setting after discharge to maximize her overall safety and independence with ADLs and mobility after discharge.     Follow Up Recommendations  SNF;Supervision/Assistance - 24 hour    Equipment Recommendations  Other (comment)(TBD in next venue )           Precautions / Restrictions Precautions Precautions: Fall Restrictions Weight Bearing Restrictions: No      Mobility Bed Mobility Overal bed mobility: Needs Assistance Bed Mobility: Supine to Sit;Sit to Supine     Supine to sit: Max assist;+2 for physical assistance;+2 for safety/equipment Sit to supine: Max assist;+2 for physical assistance;+2 for safety/equipment   General bed mobility comments: assist to advance LEs towards EOB and to bring trunk upright into sitting; assist for trunk and LE management when returning to supine   Transfers Overall transfer level: Needs assistance Equipment used: 2 person hand held assist;Rolling walker (2 wheeled) Transfers: Sit to/from Stand Sit to  Stand: Mod assist;+2 physical assistance;+2 safety/equipment         General transfer comment: Assist to rise and steady at RW, multimodal cues for hand placement; transitioned to 2person HHA to attempt stand pivot transfer however Pt with significant pain when attempting mobility/wt shifting and unable to tolerate, returned to sitting EOB     Balance Overall balance assessment: Needs assistance Sitting-balance support: Feet supported Sitting balance-Leahy Scale: Fair Sitting balance - Comments: static sitting EOB    Standing balance support: Bilateral upper extremity supported Standing balance-Leahy Scale: Poor Standing balance comment: reliant on UE support/external support                            ADL either performed or assessed with clinical judgement   ADL Overall ADL's : Needs assistance/impaired Eating/Feeding: Supervision/ safety;Sitting   Grooming: Wash/dry face;Brushing hair;Min guard;Sitting Grooming Details (indicate cue type and reason): Pt washing face and using LUE to assist RUE while brushing hair (due to RUE pain)  Upper Body Bathing: Minimal assistance;Sitting   Lower Body Bathing: Sit to/from stand;+2 for safety/equipment;Moderate assistance;+2 for physical assistance   Upper Body Dressing : Moderate assistance;Sitting Upper Body Dressing Details (indicate cue type and reason): donning second gown  Lower Body Dressing: Maximal assistance;+2 for physical assistance;+2 for safety/equipment;Sit to/from stand       Toileting- ArchitectClothing Manipulation and Hygiene: Total assistance;Bed level       Functional mobility during ADLs: Moderate assistance;+2 for physical assistance;+2 for safety/equipment General ADL Comments: Pt completing bed mobility and sit<>stand from EOB with mod-maxA+2, unable to attempt mobility this session due to pain; Pt returned to sitting EOB for grooming ADLs  Pertinent Vitals/Pain Pain  Assessment: Faces Faces Pain Scale: Hurts whole lot Pain Location: presumed pelvis as well as R shoulder Pain Descriptors / Indicators: Grimacing;Guarding;Sharp Pain Intervention(s): Limited activity within patient's tolerance;Monitored during session;Repositioned     Hand Dominance Right   Extremity/Trunk Assessment Upper Extremity Assessment Upper Extremity Assessment: RUE deficits/detail;Generalized weakness RUE: Unable to fully assess due to pain   Lower Extremity Assessment Lower Extremity Assessment: Defer to PT evaluation       Communication Communication Communication: No difficulties   Cognition Arousal/Alertness: Awake/alert Behavior During Therapy: WFL for tasks assessed/performed Overall Cognitive Status: History of cognitive impairments - at baseline                                 General Comments: advanced dementia at baseline    General Comments  family present during session               Home Living Family/patient expects to be discharged to:: Skilled nursing facility                             Home Equipment: Walker - 2 wheels          Prior Functioning/Environment Level of Independence: Needs assistance  Gait / Transfers Assistance Needed: supervision with use of RW (until fall) ADL's / Homemaking Assistance Needed: required assist for bathing/dressing and went to dining hall for meals            OT Problem List: Decreased strength;Decreased activity tolerance;Decreased range of motion;Impaired balance (sitting and/or standing);Pain      OT Treatment/Interventions: Self-care/ADL training;DME and/or AE instruction;Balance training;Therapeutic activities;Therapeutic exercise;Energy conservation;Patient/family education    OT Goals(Current goals can be found in the care plan section) Acute Rehab OT Goals Patient Stated Goal: less pain  OT Goal Formulation: With patient Time For Goal Achievement:  05/17/17 Potential to Achieve Goals: Fair  OT Frequency: Min 2X/week               Co-evaluation PT/OT/SLP Co-Evaluation/Treatment: Yes Reason for Co-Treatment: Complexity of the patient's impairments (multi-system involvement);For patient/therapist safety;To address functional/ADL transfers;Necessary to address cognition/behavior during functional activity PT goals addressed during session: Mobility/safety with mobility;Balance OT goals addressed during session: ADL's and self-care      AM-PAC PT "6 Clicks" Daily Activity     Outcome Measure Help from another person eating meals?: A Little Help from another person taking care of personal grooming?: A Little Help from another person toileting, which includes using toliet, bedpan, or urinal?: Total Help from another person bathing (including washing, rinsing, drying)?: A Lot Help from another person to put on and taking off regular upper body clothing?: A Lot Help from another person to put on and taking off regular lower body clothing?: Total 6 Click Score: 12   End of Session Equipment Utilized During Treatment: Gait belt Nurse Communication: Mobility status  Activity Tolerance: Patient tolerated treatment well;Patient limited by pain Patient left: in bed;with call bell/phone within reach;with family/visitor present  OT Visit Diagnosis: Other abnormalities of gait and mobility (R26.89);History of falling (Z91.81);Pain Pain - Right/Left: Right Pain - part of body: Shoulder(pelvis)                Time: 8119-1478 OT Time Calculation (min): 23 min Charges:  OT General Charges $OT Visit: 1 Visit OT Evaluation $OT Eval Moderate Complexity: 1 Mod G-Codes:  Marcy Siren, OT Pager [phone] 05/03/2017   Orlando Penner 05/03/2017, 1:29 PM

## 2017-05-03 NOTE — Evaluation (Signed)
Clinical/Bedside Swallow Evaluation Patient Details  Name: Yvonne Butler MRN: [id number] Date of Birth: 1933/05/02  Today's Date: 05/03/2017 Time: SLP Start Time (ACUTE ONLY): 1357 SLP Stop Time (ACUTE ONLY): 1415 SLP Time Calculation (min) (ACUTE ONLY): 18 min  Past Medical History:  Past Medical History:  Diagnosis Date  . CAD (coronary artery disease)    with CABG- Dr. Katrinka Blazing  . Complication of anesthesia   . Coronary atherosclerosis of native coronary artery   . Diabetes (HCC)   . Diverticulosis   . Falls frequently 05/01/2017  . Hypercholesteremia   . Hyperlipidemia   . Osteopenia   . PONV (postoperative nausea and vomiting)   . Vitamin D deficiency    Past Surgical History:  Past Surgical History:  Procedure Laterality Date  . APPENDECTOMY    . CORONARY ARTERY BYPASS GRAFT      in with LIMA to LAD, SVG to the circumflex, SVG to the diagonal, and SVG to the PDA.  [name] HIP ARTHROPLASTY Right 02/19/2013   Procedure: HEMI ARTHROPLASTY BIPOLAR HIP CEMENTED VS UNCEMENTED;  Surgeon: Harvie Junior, MD;  Location: [facility] OR;  Service: Orthopedics;  Laterality: Right;  . HIP SURGERY Right 02/20/2013   HEMI -BIPOLAR ARTHROPLASTY  . Ovarian Cyst & Fallopian Tubes     HPI:  84 year old female with dementia who lives in a memory care unit, coronary artery disease, atrial fibrillation not on anticoagulation currently, hx of hip hemiarthroplasty, presented with a fall and was found to have R shoulder contusion, right inferior pubic ramus fracture, being treated non-operatively. CXR (2/18) Left pleuraleffusion with bibasilar consolidation. Suspect bibasilar pneumonia, although the changes in the lung bases could be due to alveolar edema. Both alveolar edema and pneumonia may present concurrently.   Assessment / Plan / Recommendation Clinical Impression  Pt had immediate cough response on 1/6 thin liquid trials, with no other signs concerning for aspiration throughout trials. Pt had Min oral  deficits (mildly prolonged mastication and Min oral residue) with solid trials. Pt benefited from Min verbal cues to attend to task of eating and clear oral cavity. Given minimal signs concerning for aspiration, oral deficits likely being baseline secondary to dementia, and pt's family member reports no difficulty/coughing during or prior to admission, recommend continued regular solids and thin liquid with aspiration precautions. SLP services not currently indicated, please reconsult in the even of acute changes.   SLP Visit Diagnosis: Dysphagia, oropharyngeal phase (R13.12)    Aspiration Risk  Mild aspiration risk    Diet Recommendation Regular;Thin liquid   Liquid Administration via: Cup;Straw Medication Administration: Whole meds with puree(as tolerated) Supervision: Staff to assist with self feeding;Full supervision/cueing for compensatory strategies Compensations: Slow rate;Small sips/bites;Minimize environmental distractions Postural Changes: Seated upright at 90 degrees    Other  Recommendations Oral Care Recommendations: Oral care BID   Follow up Recommendations None      Frequency and Duration            Prognosis        Swallow Study   General HPI: 84 year old female with dementia who lives in a memory care unit, coronary artery disease, atrial fibrillation not on anticoagulation currently, hx of hip hemiarthroplasty, presented with a fall and was found to have R shoulder contusion, right inferior pubic ramus fracture, being treated non-operatively. CXR (2/18) Left pleuraleffusion with bibasilar consolidation. Suspect bibasilar pneumonia, although the changes in the lung bases could be due to alveolar edema. Both alveolar edema and pneumonia may present concurrently. Type of Study: Bedside  Swallow Evaluation Previous Swallow Assessment: none in chart Diet Prior to this Study: Regular;Thin liquids Temperature Spikes Noted: No Respiratory Status: Nasal cannula History of  Recent Intubation: No Behavior/Cognition: Alert;Cooperative;Pleasant mood Oral Cavity Assessment: Within Functional Limits Oral Care Completed by SLP: No Oral Cavity - Dentition: Dentures, top;Dentures, bottom Vision: Functional for self-feeding Self-Feeding Abilities: Total assist Patient Positioning: Upright in bed Baseline Vocal Quality: Normal Volitional Cough: (unable to elicit)    Oral/Motor/Sensory Function Overall Oral Motor/Sensory Function: Within functional limits   Ice Chips Ice chips: Not tested   Thin Liquid Thin Liquid: Impaired Presentation: Straw Pharyngeal  Phase Impairments: Cough - Delayed    Nectar Thick Nectar Thick Liquid: Not tested   Honey Thick Honey Thick Liquid: Not tested   Puree Puree: Within functional limits   Solid   GO   Solid: Impaired Presentation: Self Fed Oral Phase Impairments: Impaired mastication Oral Phase Functional Implications: Impaired mastication;Oral residue       [place] Jyasia Markoff SLP Student Clinician  [place] Tameyah Koch 05/03/2017,3:26 PM

## 2017-05-03 NOTE — Evaluation (Signed)
Physical Therapy Evaluation Patient Details Name: Yvonne GreavesBarbara S Butler MRN: [id number] DOB: 02/18/1934 Today's Date: 05/03/2017   History of Present Illness  84 year old female with dementia who lives in a memory care unit, coronary artery disease, atrial fibrillation not on anticoagulation currently, hx of hip hemiarthroplasty, presented with a fall and was found to have R shoulder contusion, right inferior pubic ramus fracture, being treated non-operatively.   Clinical Impression  Orders received for PT evaluation. Patient demonstrates deficits in functional mobility as indicated below. Will benefit from continued skilled PT to address deficits and maximize function. Will see as indicated and progress as tolerated.      Follow Up Recommendations SNF;Supervision/Assistance - 24 hour    Equipment Recommendations  (TBD)    Recommendations for Other Services       Precautions / Restrictions Precautions Precautions: Fall Restrictions Weight Bearing Restrictions: No      Mobility  Bed Mobility Overal bed mobility: Needs Assistance Bed Mobility: Supine to Sit;Sit to Supine     Supine to sit: Max assist;+2 for physical assistance;+2 for safety/equipment Sit to supine: Max assist;+2 for physical assistance;+2 for safety/equipment   General bed mobility comments: assist to advance LEs towards EOB and to bring trunk upright into sitting; assist for trunk and LE management when returning to supine   Transfers Overall transfer level: Needs assistance Equipment used: 2 person hand held assist;Rolling walker (2 wheeled) Transfers: Sit to/from Stand Sit to Stand: Mod assist;+2 physical assistance;+2 safety/equipment         General transfer comment: Assist to rise and steady at RW, multimodal cues for hand placement; transitioned to 2person HHA to attempt stand pivot transfer however Pt with significant pain when attempting mobility/wt shifting and unable to tolerate, returned to sitting  EOB   Ambulation/Gait                Stairs            Wheelchair Mobility    Modified Rankin (Stroke Patients Only)       Balance Overall balance assessment: Needs assistance Sitting-balance support: Feet supported Sitting balance-Leahy Scale: Fair Sitting balance - Comments: static sitting EOB    Standing balance support: Bilateral upper extremity supported Standing balance-Leahy Scale: Poor Standing balance comment: reliant on UE support/external support                              Pertinent Vitals/Pain Pain Assessment: Faces Faces Pain Scale: Hurts whole lot Pain Location: presumed pelvis as well as R shoulder Pain Descriptors / Indicators: Grimacing;Guarding;Sharp Pain Intervention(s): Limited activity within patient's tolerance;Monitored during session;Repositioned    Home Living Family/patient expects to be discharged to:: Skilled nursing facility               Home Equipment: Dan HumphreysWalker - 2 wheels      Prior Function Level of Independence: Needs assistance   Gait / Transfers Assistance Needed: supervision with use of RW (until fall)  ADL's / Homemaking Assistance Needed: required assist for bathing/dressing and went to dining hall for meals        Hand Dominance   Dominant Hand: Right    Extremity/Trunk Assessment   Upper Extremity Assessment Upper Extremity Assessment: RUE deficits/detail;Generalized weakness RUE: Unable to fully assess due to pain    Lower Extremity Assessment Lower Extremity Assessment: Generalized weakness;Difficult to assess due to impaired cognition       Communication   Communication: No difficulties  Cognition Arousal/Alertness: Awake/alert Behavior During Therapy: WFL for tasks assessed/performed Overall Cognitive Status: History of cognitive impairments - at baseline                                 General Comments: advanced dementia at baseline       General Comments  General comments (skin integrity, edema, etc.): family present during session    Exercises     Assessment/Plan    PT Assessment Patient needs continued PT services  PT Problem List Decreased strength;Decreased activity tolerance;Decreased balance;Decreased mobility;Decreased coordination;Decreased cognition;Decreased knowledge of use of DME;Decreased safety awareness;Pain       PT Treatment Interventions DME instruction;Gait training;Stair training;Functional mobility training;Therapeutic activities;Therapeutic exercise;Balance training;Patient/family education    PT Goals (Current goals can be found in the Care Plan section)  Acute Rehab PT Goals Patient Stated Goal: less pain  PT Goal Formulation: With family Time For Goal Achievement: 05/17/17 Potential to Achieve Goals: Fair    Frequency Min 2X/week   Barriers to discharge        Co-evaluation PT/OT/SLP Co-Evaluation/Treatment: Yes Reason for Co-Treatment: Complexity of the patient's impairments (multi-system involvement);For patient/therapist safety;To address functional/ADL transfers;Necessary to address cognition/behavior during functional activity PT goals addressed during session: Mobility/safety with mobility;Balance OT goals addressed during session: ADL's and self-care       AM-PAC PT "6 Clicks" Daily Activity  Outcome Measure Difficulty turning over in bed (including adjusting bedclothes, sheets and blankets)?: Unable Difficulty moving from lying on back to sitting on the side of the bed? : Unable Difficulty sitting down on and standing up from a chair with arms (e.g., wheelchair, bedside commode, etc,.)?: Unable Help needed moving to and from a bed to chair (including a wheelchair)?: Total Help needed walking in hospital room?: Total Help needed climbing 3-5 steps with a railing? : Total 6 Click Score: 6    End of Session Equipment Utilized During Treatment: Gait belt;Oxygen Activity Tolerance: Patient  limited by pain Patient left: in bed;with call bell/phone within reach;with bed alarm set;with family/visitor present Nurse Communication: Mobility status PT Visit Diagnosis: Difficulty in walking, not elsewhere classified (R26.2)    Time: 6962-9528 PT Time Calculation (min) (ACUTE ONLY): 23 min   Charges:   PT Evaluation $PT Eval Moderate Complexity: 1 Mod     PT G Codes:        Charlotte Crumb, PT DPT  Board Certified Neurologic Specialist [phone]   Fabio Asa 05/03/2017, 1:36 PM

## 2017-05-03 NOTE — NC FL2 (Signed)
[place] MEDICAID FL2 LEVEL OF CARE SCREENING TOOL     IDENTIFICATION  Patient Name: Yvonne Butler Birthdate: 1934-02-09 Sex: female Admission Date (Current Location): 05/01/2017  [facility] and [place] Number:  Producer, television/film/video and Address:  [facility], [address] [place], [place] [address]      Provider Number: [id number]  Attending Physician Name and Address:  Glade Lloyd, MD  Relative Name and Phone Number:       Current Level of Care: Hospital Recommended Level of Care: Assisted Living Facility, Memory Care Prior Approval Number:    Date Approved/Denied:   PASRR Number:    Discharge Plan: ALF; Memory Care    Current Diagnoses: Patient Active Problem List   Diagnosis Date Noted  . Falls frequently 05/01/2017  . Pubic ramus fracture (HCC) 05/01/2017  . Chronic anticoagulation 02/05/2016  . Atrial fibrillation (HCC) 03/27/2015  . Other bilateral secondary osteoarthritis of knee 02/21/2013  . Femoral neck fracture (HCC) 02/19/2013  . Leukocytosis 02/19/2013  . Memory loss 02/19/2013  . Fracture of femoral neck, right (HCC) 02/19/2013  . Coronary atherosclerosis of autologous vein bypass graft 01/03/2013    Class: Diagnosis of  . HLD (hyperlipidemia) 01/03/2013    Class: Chronic  . DM ketoacidosis type II, uncontrolled (HCC) 01/03/2013    Class: Chronic    Orientation RESPIRATION BLADDER Height & Weight     Self  O2(2L Desha) Incontinent Weight: 130 lb (59 kg) Height:  5\' 2"  (157.5 cm)  BEHAVIORAL SYMPTOMS/MOOD NEUROLOGICAL BOWEL NUTRITION STATUS      Continent Diet(see DC summary)  AMBULATORY STATUS COMMUNICATION OF NEEDS Skin   Extensive Assist Verbally Normal                       Personal Care Assistance Level of Assistance  Bathing, Dressing Bathing Assistance: Maximum assistance   Dressing Assistance: Maximum assistance     Functional Limitations Info             SPECIAL CARE FACTORS FREQUENCY  PT  (By licensed PT), OT (By licensed OT)     PT Frequency: 2/wk with home health OT Frequency: 2/wk with home health            Contractures      Additional Factors Info  Code Status, Allergies, Psychotropic, Insulin Sliding Scale Code Status Info: DNR Allergies Info: NKA Psychotropic Info: buspar Insulin Sliding Scale Info: 3/day       Current Medications (05/03/2017):  This is the current hospital active medication list Current Facility-Administered Medications  Medication Dose Route Frequency Provider Last Rate Last Dose  . amLODipine (NORVASC) tablet 5 mg  5 mg Oral Daily Glade Lloyd, MD   5 mg at 05/03/17 0912  . bisacodyl (DULCOLAX) suppository 10 mg  10 mg Rectal Daily PRN Leandro Reasoner Tublu, MD      . busPIRone (BUSPAR) tablet 10 mg  10 mg Oral BID Leandro Reasoner Tublu, MD   10 mg at 05/03/17 0912  . enoxaparin (LOVENOX) injection 40 mg  40 mg Subcutaneous Q24H Hanley Ben, Kshitiz, MD   40 mg at 05/02/17 1449  . fentaNYL (SUBLIMAZE) injection 12.5 mcg  12.5 mcg Intravenous Q2H PRN Leandro Reasoner Tublu, MD   12.5 mcg at 05/02/17 0948  . hydrALAZINE (APRESOLINE) injection 10 mg  10 mg Intravenous Q6H PRN Bodenheimer, Charles A, NP   10 mg at 05/03/17 0721  . insulin aspart (novoLOG) injection 0-9 Units  0-9 Units Subcutaneous  TID WC Pieter Partridgehatterjee, Srobona Tublu, MD   1 Units at 05/03/17 1217  . magnesium citrate solution 1 Bottle  1 Bottle Oral Once PRN Pieter Partridgehatterjee, Srobona Tublu, MD      . MEDLINE mouth rinse  15 mL Mouth Rinse BID Glade LloydAlekh, Kshitiz, MD   15 mL at 05/02/17 2150  . metoprolol tartrate (LOPRESSOR) tablet 50 mg  50 mg Oral BID Glade LloydAlekh, Kshitiz, MD   50 mg at 05/03/17 0913  . oxyCODONE (Oxy IR/ROXICODONE) immediate release tablet 5-10 mg  5-10 mg Oral Q6H PRN Glade LloydAlekh, Kshitiz, MD   5 mg at 05/03/17 0506  . piperacillin-tazobactam (ZOSYN) IVPB 3.375 g  3.375 g Intravenous Q8H Bajbus, Lauren D, RPH   Stopped at 05/03/17 [id number]  . senna-docusate (Senokot-S) tablet 2  tablet  2 tablet Oral QHS Pieter Partridgehatterjee, Srobona Tublu, MD   2 tablet at 05/02/17 2151  . traMADol-acetaminophen (ULTRACET) 37.5-325 MG per tablet 1 tablet  1 tablet Oral BID Glade LloydAlekh, Kshitiz, MD   1 tablet at 05/03/17 [id number]     Discharge Medications: Please see discharge summary for a list of discharge medications.  Relevant Imaging Results:  Relevant Lab Results:   Additional Information SS#: [id number]; will need home health RN, PT, OT, and oxygen; will also need DME hospital bed  Tymeka Privette, Wyn QuakerJenna H, LCSW

## 2017-05-03 NOTE — Progress Notes (Signed)
Patient ID: Yvonne GreavesBarbara S Butler, female   DOB: February 04, 1934, 84 y.o.   MRN: [id number]  PROGRESS NOTE    Yvonne GreavesBarbara S Butler  WUJ:[id number] DOB: February 04, 1934 DOA: 05/01/2017 PCP: Merlene LaughterStoneking, Hal, MD   Brief Narrative:  84 year old female with dementia who lives in a memory care unit, coronary artery disease, atrial fibrillation not on anticoagulation currently due to recurrent falls probably, dementia, frequent falls presented with a fall and was found to have right inferior pubic ramus fracture.  Orthopedics was  consulted.   Assessment & Plan:   Principal Problem:   Pubic ramus fracture (HCC) Active Problems:   Coronary atherosclerosis of autologous vein bypass graft   Leukocytosis   Memory loss   Atrial fibrillation (HCC)   Chronic anticoagulation   Falls frequently  RIGHT INFERIOR PUBIC RAMUS FRACTURE Orthopedics consult appreciated: Weight bearing as tolerated -Continue pain management. -PT/OT eval -Fall precautions  LEUKOCYTOSIS -Persistent -Probably reactive -Currently on  Vancomycin and zosyn empirically. DC vancomycin. Will get CXR today and depending on that, will decide about Zosyn. SLP evaluation.  FALL -Patient with multiple histories of falls in the past with a negative workup. Head CT here is negative for intracranial hemorrhage.  DM type II -Oral meds on hold.  Accu-Cheks with insulin sliding scale  THYROID MASS -Follow-up as an outpatient as warranted  DEMENTIA -Continue Aricept, buspirone  CHRONIC AFIB -Tachycardic. Increase Metoprolol to 50mg  BID - not on anticoagulation currently probably due to history of recurrent falls  CAD -On aspirin, metoprolol and pravastatin  HYPERTENSION -Blood pressure on the higher side. Increase metoprolol to 50 mg twice daily - Add Amlodipine   DVT prophylaxis: lovenox Code Status: DNR Family Communication: Spoke to daughter and husband at bedside Disposition Plan: Care unit versus skilled nursing  facility  Consultants: Orthopedics Procedures: None  Antimicrobials: Vancomycin and Zosyn from 05/01/2017 onwards   Subjective: Patient seen and examined at bedside.  She is sleepy, wakes up slightly  but confused.  No overnight fever or vomiting.   Objective: Vitals:   05/03/17 0114 05/03/17 0456 05/03/17 0648 05/03/17 0912  BP: (!) 161/69 (!) 154/121 (!) 193/88 138/67  Pulse: 100 98  100  Resp: 18 18    Temp: 98.2 F (36.8 C) 98.6 F (37 C)    TempSrc: Oral Oral    SpO2: 96% 100%    Weight:      Height:        Intake/Output Summary (Last 24 hours) at 05/03/2017 0933 Last data filed at 05/03/2017 0456 Gross per 24 hour  Intake 1270.83 ml  Output 700 ml  Net 570.83 ml   Filed Weights   05/01/17 0800  Weight: 59 kg (130 lb)    Examination:  General exam: Elderly female lying in bed.  No acute distress. Confused  Respiratory system: Bilateral decreased breath sound at bases Cardiovascular system: S1 & S2 heard, rate controlled  gastrointestinal system: Abdomen is nondistended, soft and nontender. Normal bowel sounds heard. Central nervous system: Confused. No focal neurological deficits. Moving extremities Extremities: No cyanosis, clubbing; trace edema Skin: No rashes, lesions or ulcers Lymph: No cervical lymphadenopathy    Data Reviewed: I have personally reviewed following labs and imaging studies  CBC: Recent Labs  Lab 05/01/17 1115 05/01/17 1919 05/02/17 0338 05/03/17 0014  WBC 16.5* 14.8* 15.6* 16.5*  NEUTROABS 14.1*  --   --  14.2*  HGB 14.7 15.0 14.6 14.8  HCT 43.6 44.4 43.4 43.6  MCV 98.0 98.0 98.0 97.8  PLT 239 250 251  204   Basic Metabolic Panel: Recent Labs  Lab 05/01/17 1115 05/02/17 0338 05/03/17 0014  NA 138 137 134*  K 4.0 3.9 3.8  CL 104 104 102  CO2 22 21* 20*  GLUCOSE 157* 147* 193*  BUN 18 12 15   CREATININE 0.83 0.77 0.93  CALCIUM 9.4 8.9 9.0  MG  --   --  1.8   GFR: Estimated Creatinine Clearance: 37.5 mL/min (by C-G  formula based on SCr of 0.93 mg/dL). Liver Function Tests: No results for input(s): AST, ALT, ALKPHOS, BILITOT, PROT, ALBUMIN in the last 168 hours. No results for input(s): LIPASE, AMYLASE in the last 168 hours. No results for input(s): AMMONIA in the last 168 hours. Coagulation Profile: No results for input(s): INR, PROTIME in the last 168 hours. Cardiac Enzymes: No results for input(s): CKTOTAL, CKMB, CKMBINDEX, TROPONINI in the last 168 hours. BNP (last 3 results) No results for input(s): PROBNP in the last 8760 hours. HbA1C: No results for input(s): HGBA1C in the last 72 hours. CBG: Recent Labs  Lab 05/02/17 1309 05/02/17 1715 05/02/17 2131 05/03/17 0646 05/03/17 0838  GLUCAP 125* 120* 153* 116* 117*   Lipid Profile: No results for input(s): CHOL, HDL, LDLCALC, TRIG, CHOLHDL, LDLDIRECT in the last 72 hours. Thyroid Function Tests: No results for input(s): TSH, T4TOTAL, FREET4, T3FREE, THYROIDAB in the last 72 hours. Anemia Panel: No results for input(s): VITAMINB12, FOLATE, FERRITIN, TIBC, IRON, RETICCTPCT in the last 72 hours. Sepsis Labs: No results for input(s): PROCALCITON, LATICACIDVEN in the last 168 hours.  Recent Results (from the past 240 hour(s))  MRSA PCR Screening     Status: None   Collection Time: 05/01/17  6:00 PM  Result Value Ref Range Status   MRSA by PCR NEGATIVE NEGATIVE Final    Comment:        The GeneXpert MRSA Assay (FDA approved for NASAL specimens only), is one component of a comprehensive MRSA colonization surveillance program. It is not intended to diagnose MRSA infection nor to guide or monitor treatment for MRSA infections. Performed at [facility], [address], [place], [place] [address]          Radiology Studies: Ct Pelvis Wo Contrast  Result Date: 05/01/2017 CLINICAL DATA:  Right hip pain after fall.  Evaluate for fracture. EXAM: CT PELVIS WITHOUT CONTRAST TECHNIQUE: Multidetector CT imaging of the pelvis was  performed following the standard protocol without intravenous contrast. COMPARISON:  Bilateral hip x-rays from same day. FINDINGS: Bones/Joint/Cartilage Prior right hip hemiarthroplasty. No hardware failure or loosening. Acute, nondisplaced fracture of the right inferior pubic ramus. No additional fracture seen. Normal alignment. No joint effusion. Mild left hip joint space narrowing. Advanced degenerative changes at L5-S1. Ligaments Ligaments are suboptimally evaluated by CT. Muscles and Tendons Atrophy of the right piriformis muscle. Soft tissue No fluid collection or hematoma.  No soft tissue mass. IMPRESSION: 1. Acute, nondisplaced fracture of the right inferior pubic ramus. 2. Prior right hip hemiarthroplasty without evidence of hardware complication. Electronically Signed   By: Obie Dredge [name]   On: 05/01/2017 11:49        Scheduled Meds: . amLODipine  5 mg Oral Daily  . busPIRone  10 mg Oral BID  . enoxaparin (LOVENOX) injection  40 mg Subcutaneous Q24H  . insulin aspart  0-9 Units Subcutaneous TID WC  . mouth rinse  15 mL Mouth Rinse BID  . metoprolol tartrate  50 mg Oral BID  . senna-docusate  2 tablet Oral QHS  .  traMADol-acetaminophen  1 tablet Oral BID   Continuous Infusions: . piperacillin-tazobactam (ZOSYN)  IV Stopped (05/03/17 7829)     LOS: 0 days        Glade Lloyd, MD [facility] Pager [phone]  If 7PM-7AM, please contact night-coverage [email or web address] Password [facility] 05/03/2017, 9:33 AM

## 2017-05-03 NOTE — Clinical Social Work Note (Signed)
Clinical Social Work Assessment  Patient Details  Name: Francia GreavesBarbara S Fyock MRN: [id number] Date of Birth: 05/27/1933  Date of referral:  05/03/17               Reason for consult:  Discharge Planning                Permission sought to share information with:  Facility Medical sales representativeContact Representative, Family Supports Permission granted to share information::  No  Name::     Cathy/William  Agency::  [facility]  Relationship::  Daughter/Spouse  Contact Information:  [phone]  Housing/Transportation Living arrangements for the past 2 months:  Assisted Living Facility Source of Information:  Spouse, Adult Children Patient Interpreter Needed:  None Criminal Activity/Legal Involvement Pertinent to Current Situation/Hospitalization:  No - Comment as needed Significant Relationships:  Adult Children, Spouse Lives with:  Facility Resident Do you feel safe going back to the place where you live?  Yes Need for family participation in patient care:  Yes (Comment)  Care giving concerns:  CSW received consult for possible SNF placement at time of discharge. CSW spoke with patient's spouse and daughter regarding PT recommendation of SNF placement at time of discharge. Patient's daughter [facility]. Patient's spouse would rather patient discharge back there. Patient's daughter expressed understanding of PT recommendation and would be agreeable to SNF placement at time of discharge but did not like the SNF options. Therefore she would rather patient return to memory care. CSW to continue to follow and assist with discharge planning needs.   Social Worker assessment / plan:  CSW spoke with patient's family [facility].  Employment status:  Retired Database administratornsurance information:  Managed Medicare PT Recommendations:  Skilled Nursing Facility Information / Referral to community resources:  Skilled Nursing  Facility  Patient/Family's Response to care:  Patient's daughter recognizes need for rehab and [facility]. RNCM to set up oxygen and hospital bed.   Patient/Family's Understanding of and Emotional Response to Diagnosis, Current Treatment, and Prognosis:  Patient/family is realistic regarding therapy needs and expressed being hopeful for return to memory care. Patient's daughter expressed understanding of CSW role and discharge process as well as medical condition. No questions/concerns about plan or treatment.    Emotional Assessment Appearance:  Appears stated age Attitude/Demeanor/Rapport:  Unable to Assess Affect (typically observed):  Unable to Assess Orientation:  Oriented to Self Alcohol / Substance use:  Not Applicable Psych involvement (Current and /or in the community):  No (Comment)  Discharge Needs  Concerns to be addressed:  Care Coordination Readmission within the last 30 days:  No Current discharge risk:  None Barriers to Discharge:  Continued Medical Work up   [employer] S [place], LCSWA 05/03/2017, 4:01 PM

## 2017-05-03 NOTE — Progress Notes (Signed)
Orthopedic Tech Progress Note Patient Details:  Yvonne GreavesBarbara S Butler Feb 25, 1934 [id number]  Ortho Devices Ortho Device/Splint Location: Trapeze bar   Post Interventions Patient Tolerated: Unable to use device properly   Saul FordyceJennifer C Berk Pilot 05/03/2017, 8:13 AM

## 2017-05-03 NOTE — Care Management Obs Status (Signed)
MEDICARE OBSERVATION STATUS NOTIFICATION   Patient Details  Name: Yvonne Butler MRN: [id number] Date of Birth: [birth year]   Medicare Observation Status Notification Given:  Yes    Epifanio LeschesCole, [name], RN 05/03/2017, 3:56 PM

## 2017-05-04 DIAGNOSIS — I482 Chronic atrial fibrillation: Secondary | ICD-10-CM | POA: Diagnosis not present

## 2017-05-04 DIAGNOSIS — I25810 Atherosclerosis of coronary artery bypass graft(s) without angina pectoris: Secondary | ICD-10-CM | POA: Diagnosis not present

## 2017-05-04 DIAGNOSIS — Z7901 Long term (current) use of anticoagulants: Secondary | ICD-10-CM | POA: Diagnosis not present

## 2017-05-04 DIAGNOSIS — R0902 Hypoxemia: Secondary | ICD-10-CM | POA: Diagnosis present

## 2017-05-04 DIAGNOSIS — J690 Pneumonitis due to inhalation of food and vomit: Secondary | ICD-10-CM

## 2017-05-04 DIAGNOSIS — S32591A Other specified fracture of right pubis, initial encounter for closed fracture: Secondary | ICD-10-CM | POA: Diagnosis not present

## 2017-05-04 DIAGNOSIS — R296 Repeated falls: Secondary | ICD-10-CM | POA: Diagnosis not present

## 2017-05-04 LAB — GLUCOSE, CAPILLARY
GLUCOSE-CAPILLARY: 125 mg/dL — AB (ref 65–99)
GLUCOSE-CAPILLARY: 146 mg/dL — AB (ref 65–99)
Glucose-Capillary: 116 mg/dL — ABNORMAL HIGH (ref 65–99)

## 2017-05-04 LAB — CBC WITH DIFFERENTIAL/PLATELET
BASOS ABS: 0 10*3/uL (ref 0.0–0.1)
Basophils Relative: 0 %
EOS PCT: 6 %
Eosinophils Absolute: 0.9 10*3/uL — ABNORMAL HIGH (ref 0.0–0.7)
HCT: 44.1 % (ref 36.0–46.0)
Hemoglobin: 14.9 g/dL (ref 12.0–15.0)
Lymphocytes Relative: 7 %
Lymphs Abs: 1.1 10*3/uL (ref 0.7–4.0)
MCH: 33 pg (ref 26.0–34.0)
MCHC: 33.8 g/dL (ref 30.0–36.0)
MCV: 97.6 fL (ref 78.0–100.0)
Monocytes Absolute: 1.4 10*3/uL — ABNORMAL HIGH (ref 0.1–1.0)
Monocytes Relative: 9 %
NEUTROS PCT: 78 %
Neutro Abs: 11.5 10*3/uL — ABNORMAL HIGH (ref 1.7–7.7)
PLATELETS: 205 10*3/uL (ref 150–400)
RBC: 4.52 MIL/uL (ref 3.87–5.11)
RDW: 14.7 % (ref 11.5–15.5)
WBC: 14.9 10*3/uL — AB (ref 4.0–10.5)

## 2017-05-04 LAB — BASIC METABOLIC PANEL
Anion gap: 15 (ref 5–15)
BUN: 17 mg/dL (ref 6–20)
CO2: 24 mmol/L (ref 22–32)
CREATININE: 0.84 mg/dL (ref 0.44–1.00)
Calcium: 9.3 mg/dL (ref 8.9–10.3)
Chloride: 100 mmol/L — ABNORMAL LOW (ref 101–111)
Glucose, Bld: 119 mg/dL — ABNORMAL HIGH (ref 65–99)
POTASSIUM: 3.4 mmol/L — AB (ref 3.5–5.1)
SODIUM: 139 mmol/L (ref 135–145)

## 2017-05-04 LAB — MAGNESIUM: Magnesium: 1.9 mg/dL (ref 1.7–2.4)

## 2017-05-04 MED ORDER — AMOXICILLIN-POT CLAVULANATE 875-125 MG PO TABS: 1.0000 | ORAL_TABLET | Freq: Two times a day (BID) | ORAL | 0 refills | Status: AC

## 2017-05-04 MED ORDER — FUROSEMIDE 20 MG PO TABS: 40.0000 mg | ORAL_TABLET | Freq: Every day | ORAL | 0 refills | Status: DC

## 2017-05-04 MED ORDER — POTASSIUM CHLORIDE CRYS ER 20 MEQ PO TBCR
40.0000 meq | EXTENDED_RELEASE_TABLET | Freq: Once | ORAL | Status: AC
  Administered 2017-05-04: 40 meq via ORAL
  Filled 2017-05-04: qty 2

## 2017-05-04 MED ORDER — FUROSEMIDE 10 MG/ML IJ SOLN
60.0000 mg | Freq: Once | INTRAMUSCULAR | Status: AC
  Administered 2017-05-04: 60 mg via INTRAVENOUS
  Filled 2017-05-04: qty 6

## 2017-05-04 MED ORDER — BISACODYL 10 MG RE SUPP: 10.0000 mg | Freq: Every day | RECTAL | 0 refills | Status: AC | PRN

## 2017-05-04 MED ORDER — OXYCODONE HCL 5 MG PO TABS: 5.0000 mg | ORAL_TABLET | Freq: Four times a day (QID) | ORAL | 0 refills | Status: DC | PRN

## 2017-05-04 MED ORDER — METOPROLOL TARTRATE 50 MG PO TABS: 50.0000 mg | ORAL_TABLET | Freq: Two times a day (BID) | ORAL | 0 refills | Status: DC

## 2017-05-04 MED ORDER — TRAMADOL-ACETAMINOPHEN 37.5-325 MG PO TABS: 1.0000 | ORAL_TABLET | Freq: Two times a day (BID) | ORAL | 0 refills | Status: DC

## 2017-05-04 MED ORDER — SENNOSIDES-DOCUSATE SODIUM 8.6-50 MG PO TABS: 1.0000 | ORAL_TABLET | Freq: Two times a day (BID) | ORAL | 0 refills | Status: AC

## 2017-05-04 NOTE — Progress Notes (Signed)
Patient's daughter received a call from [facility] that her oxygen was about to be set up at [facility]. Called [facility] to set up transport. Patient and family aware. 

## 2017-05-04 NOTE — Progress Notes (Signed)
Patient ID: Francia GreavesBarbara S Sylve, female   DOB: 12-18-1933, 84 y.o.   MRN: [id number]    Durable Medical Equipment  (From admission, onward)        Start     Ordered   05/04/17 1100  For home use only DME Hospital bed  Once    Question Answer Comment  Patient has (list medical condition): Patient is currently bed ridden due to pubic ramus fracture   The above medical condition requires: Patient requires the ability to reposition frequently   Head must be elevated greater than: 30 degrees   Bed type Semi-electric      05/04/17 1100   05/04/17 0000  For home use only DME oxygen    Question Answer Comment  Mode or (Route) Nasal cannula   Liters per Minute 2   Frequency Continuous (stationary and portable oxygen unit needed)   Oxygen conserving device Yes   Oxygen delivery system Gas      05/04/17 1048

## 2017-05-04 NOTE — Progress Notes (Signed)
CSW following for [facility]- confirmed with facility they can still accept today after home O2 arrives at facility- per home health agency home equipment can arrive between 3-7pm.  ALF to call CSW or after hours CSW ( [phone]) once equipment arrives so transport can be set up  Burna SisJenna H. Jarion Hawthorne, LCSW Clinical Social Worker [phone]

## 2017-05-04 NOTE — Progress Notes (Signed)
SATURATION QUALIFICATIONS: (This note is used to comply with regulatory documentation for home oxygen)  Patient Saturations on Room Air at Rest = 86%  Patient Saturations on Room Air while Ambulating = 86%  Patient is currently requiring 1L oxygen. Saturations 94% on 1L.

## 2017-05-04 NOTE — Progress Notes (Signed)
Francia GreavesBarbara S Dimon to [facility]. [facility] VVS, Skin clean, dry and intact without evidence of skin break down, no evidence of skin tears noted.  IV catheter discontinued intact. Site without signs and symptoms of complications. Dressing and pressure applied.  An After Visit Summary was printed and [facility].   [name] Gillslisa R Zygmunt Mcglinn  05/04/2017 8:01 PM

## 2017-05-04 NOTE — Care Management Note (Addendum)
Case Management Note  Patient Details  Name: Yvonne GreavesBarbara S Butler MRN: [id number] Date of Birth: [birth year]  Subjective/Objective:       Pubic ramus fracture             Action/Plan: Transition back to [facility] today with home oxygen.  [facility] to delivery hospital bed and home oxygen to facility prior to pt's d/c from hospital. Delivery time set for 3-7pm.  CSW managing disposition to facility.  Expected Discharge Date:  05/04/17               Expected Discharge Plan:  Assisted Living / Rest Home  In-House Referral:  Clinical Social Work  Discharge planning Services  CM Consult  Post Acute Care Choice:    Choice offered to:  Patient  DME Arranged:  Hospital bed, Oxygen DME Agency:  [facility]  [facility]     Status of Service:  Completed, signed off  If discussed at Long Length of Stay Meetings, dates discussed:    Additional Comments:  Epifanio LeschesCole, Jaymen Fetch Hudson, RN 05/04/2017, 12:18 PM

## 2017-05-04 NOTE — Discharge Summary (Addendum)
Physician Discharge Summary  Yvonne Butler ZOX:[id number] DOB: Nov 17, 1933 DOA: 05/01/2017  PCP: Merlene Laughter, MD  Admit date: 05/01/2017 Discharge date: 05/04/2017  Admitted From: [facility] Disposition:  [facility]  Recommendations for Outpatient Follow-up:  1. Follow up with PCP in 1 week with repeat CBC/BMP 2. Follow-up with orthopedics in 2-4 weeks 3. Activity as per orthopedics and physical therapy recommendations 4. Fall precautions 5. Diet as per SLP recommendations   Home Health: Yes equipment/Devices: None Discharge Condition: Guarded CODE STATUS: DNR Diet recommendation: Heart Healthy   Brief/Interim Summary: 84 year old female with dementia who lives in a memory care unit, coronary artery disease, atrial fibrillation not on anticoagulation currently due to recurrent falls probably, dementia, frequent falls presented with a fall and was found to have right inferior pubic ramus fracture.  Orthopedics was  consulted.  Orthopedics recommended weightbearing as tolerated on outpatient follow-up.  No surgical intervention.  Patient was also empirically started on intravenous antibiotics for leukocytosis and hypotension.  Repeat chest x-ray showed probable consolidation.  Antibiotics will be switched to oral Augmentin for the next 5 days.  Patient's daughter wants the patient to be discharged back to memory care facility today, although PT recommended SNF placement.   Discharge Diagnoses:  Principal Problem:   Pubic ramus fracture (HCC) Active Problems:   Coronary atherosclerosis of autologous vein bypass graft   Leukocytosis   Memory loss   Atrial fibrillation (HCC)   Chronic anticoagulation   Falls frequently  RIGHT INFERIOR PUBIC RAMUS FRACTURE Orthopedics consult appreciated: Weight bearing as tolerated.  Outpatient follow-up with orthopedics.  No surgical intervention -Continue pain management. -Continue PT evaluation in the memory  care facility.  Family refused nursing home placement -Fall precautions  LEUKOCYTOSIS -Improving.  Outpatient follow-up.  Antibiotic plan as below  Probable aspiration causing hypoxia -Initially started on Vancomycin and zosyn empirically.  Vancomycin was subsequently discontinued.  Chest x-ray yesterday showed congestion versus atelectasis and consolidation.  Zosyn was continued.  Patient will be discharged home on oral Augmentin for 5 more days.  -Diet as per SLP recommendations -Oxygen supplementation will be arranged.  Her oxygen saturation dropped to the 80s when nasal cannula was removed.  She will require oxygen via nasal cannula 1-2 L/min continuously for diagnosis of hypoxia. -Patient was given 1 dose of intravenous Lasix yesterday and will give 1 more dose of intravenous Lasix today.  On discharge, Lasix dose can be increased to 40 mg daily at least for now with outpatient follow-up.  FALL -Patient with multiple histories of falls in the past with a negative workup. Head CT here is negative for intracranial hemorrhage.  Fall precaution  DM type II -Resume home meds.  Outpatient follow up  THYROID MASS -Follow-up as an outpatient as warranted  DEMENTIA -Continue Aricept,buspirone  CHRONIC AFIB -Metoprolol was increased to 50 mg twice daily for tachycardia - not on anticoagulation currently probably due to history of recurrent falls -Follow-up  CAD -On aspirin, metoprolol and pravastatin  HYPERTENSION -Continue increased dose of metoprolol 50 mg twice a day, lisinopril current dose. -Increase Lasix to 40 mg daily at least for now   Discharge Instructions  Discharge Instructions    Call MD for:  difficulty breathing, headache or visual disturbances   Complete by:  As directed    Call MD for:  extreme fatigue   Complete by:  As directed    Call MD for:  hives   Complete by:  As directed  Call MD for:  persistant dizziness or light-headedness   Complete  by:  As directed    Call MD for:  persistant nausea and vomiting   Complete by:  As directed    Call MD for:  severe uncontrolled pain   Complete by:  As directed    Call MD for:  temperature >100.4   Complete by:  As directed    Diet - low sodium heart healthy   Complete by:  As directed    Discharge instructions   Complete by:  As directed    Fall precautions Activity as per PT recommendations   Increase activity slowly   Complete by:  As directed      Allergies as of 05/04/2017   No Known Allergies     Medication List    TAKE these medications   amoxicillin-clavulanate 875-125 MG tablet Commonly known as:  AUGMENTIN Take 1 tablet by mouth 2 (two) times daily for 5 days.   bisacodyl 10 MG suppository Commonly known as:  DULCOLAX Place 1 suppository (10 mg total) rectally daily as needed for moderate constipation.   busPIRone 10 MG tablet Commonly known as:  BUSPAR Take 10 mg by mouth 2 (two) times daily.   camphor-menthol lotion Commonly known as:  SARNA Apply 1 application topically 3 (three) times daily.   furosemide 20 MG tablet Commonly known as:  LASIX Take 2 tablets (40 mg total) by mouth daily. What changed:  how much to take   glimepiride 1 MG tablet Commonly known as:  AMARYL Take 1 mg by mouth See admin instructions. Taking one tablet with breakfast and 1 tablet with lunch daily   guaiFENesin 100 MG/5ML Soln Commonly known as:  ROBITUSSIN Take 10 mLs by mouth every 6 (six) hours as needed for cough or to loosen phlegm.   hydrocortisone 2.5 % cream Apply 1 application topically daily as needed for itching.   hydrOXYzine 10 MG tablet Commonly known as:  ATARAX/VISTARIL Take 10 mg by mouth 3 (three) times daily.   lisinopril 5 MG tablet Commonly known as:  PRINIVIL,ZESTRIL Take 5 mg by mouth daily.   metoprolol tartrate 50 MG tablet Commonly known as:  LOPRESSOR Take 1 tablet (50 mg total) by mouth 2 (two) times daily. What changed:     medication strength  how much to take   multivitamin tablet Take 1 tablet by mouth daily.   oxyCODONE 5 MG immediate release tablet Commonly known as:  Oxy IR/ROXICODONE Take 1-2 tablets (5-10 mg total) by mouth every 6 (six) hours as needed for moderate pain.   PRESERVISION AREDS 2 Caps Take 1 capsule by mouth 2 (two) times daily.   senna-docusate 8.6-50 MG tablet Commonly known as:  Senokot-S Take 1 tablet by mouth 2 (two) times daily.   traMADol-acetaminophen 37.5-325 MG tablet Commonly known as:  ULTRACET Take 1 tablet by mouth 2 (two) times daily.       Contact information for follow-up providers    Bjorn Pippin, MD Follow up in 4 week(s).   Specialty:  Orthopedic Surgery Contact information: [address] [place] [address] [phone]        Merlene Laughter, MD. Schedule an appointment as soon as possible for a visit in 1 week(s).   Specialty:  Internal Medicine Contact information: [address] [place] [address] [phone]            Contact information for after-discharge care    Destination  [facility] Follow up.   Service:  Assisted Living Contact information: [phone] [address] [place] [address] [phone]                    Durable Medical Equipment  (From admission, onward)        Start     Ordered   05/04/17 1100  For home use only DME Hospital bed  Once    Question Answer Comment  Patient has (list medical condition): Patient is currently bed ridden due to pubic ramus fracture   The above medical condition requires: Patient requires the ability to reposition frequently   Head must be elevated greater than: 30 degrees   Bed type Semi-electric      05/04/17 1100   05/04/17 0000  For home use only DME oxygen    Question Answer Comment  Mode or (Route) Nasal cannula   Liters per Minute 2   Frequency Continuous (stationary and portable oxygen  unit needed)   Oxygen conserving device Yes   Oxygen delivery system Gas      05/04/17 1048      No Known Allergies  Consultations:  Orthopedic   Procedures/Studies: Dg Chest 2 View  Result Date: 05/03/2017 CLINICAL DATA:  Shortness of Breath EXAM: CHEST  2 VIEW COMPARISON:  May 01, 2017 FINDINGS: There is patchy airspace consolidation in the lung bases with left pleural effusion. There is mild underlying interstitial pulmonary edema. There is cardiomegaly with pulmonary venous hypertension. Patient is status post coronary artery bypass grafting. No adenopathy. There is aortic atherosclerosis. There is calcification in the mitral annulus. No bone lesions. IMPRESSION: Underlying changes suggesting congestive heart failure. Left pleural effusion with bibasilar consolidation. Suspect bibasilar pneumonia, although the changes in the lung bases could be due to alveolar edema. Both alveolar edema and pneumonia may present concurrently. Patient is status post coronary artery bypass grafting. There is aortic atherosclerosis. Aortic Atherosclerosis (ICD10-I70.0). Electronically Signed   By: Bretta Bang III [name]   On: 05/03/2017 10:46   Dg Chest 2 View  Result Date: 05/01/2017 CLINICAL DATA:  Fall, pain, initial encounter. EXAM: CHEST  2 VIEW COMPARISON:  06/03/2015. FINDINGS: Trachea is deviated slightly to the left, which may be due to right thyroid enlargement. Heart is enlarged. Thoracic aorta is calcified. Biapical pleural scarring. Lungs are somewhat low in volume with mild atelectasis at the left lung base. Tiny pleural effusion on the lateral view is difficult to localize on the frontal view. Visualized osseous structures appear grossly intact. IMPRESSION: 1. Left basilar atelectasis. 2. Tiny pleural effusion, difficult to localize on the frontal view. Electronically Signed   By: Leanna Battles [name]   On: 05/01/2017 09:06   Ct Head Wo Contrast  Result Date: 05/01/2017 CLINICAL  DATA:  Found on the floor. EXAM: CT HEAD WITHOUT CONTRAST CT CERVICAL SPINE WITHOUT CONTRAST TECHNIQUE: Multidetector CT imaging of the head and cervical spine was performed following the standard protocol without intravenous contrast. Multiplanar CT image reconstructions of the cervical spine were also generated. COMPARISON:  CT head and cervical spine dated January 07, 2016. FINDINGS: CT HEAD FINDINGS Brain: No evidence of acute infarction, hemorrhage, hydrocephalus, extra-axial collection or mass lesion/mass effect. Stable atrophy and chronic microvascular ischemic changes. Vascular: Calcified atherosclerosis at the skullbase. No hyperdense vessel. Skull: Negative for fracture or focal lesion. Sinuses/Orbits: No acute finding. Other: Stable left frontal scalp subcutaneous lesion. CT CERVICAL SPINE FINDINGS Alignment: No traumatic malalignment. Skull base and  vertebrae: No acute fracture. No primary bone lesion or focal pathologic process. Soft tissues and spinal canal: No prevertebral fluid or swelling. No visible canal hematoma. Disc levels: Moderate to severe degenerative disc disease and uncovertebral hypertrophy at C5-C6 and C6-C7, similar to prior study. Moderate to severe right-sided facet arthropathy throughout the cervical spine. Multilevel mild to moderate neuroforaminal stenosis. Upper chest: Mild centrilobular emphysema. Other: Slight interval enlargement of the right thyroid mass, now measuring 3.8 cm, previously 3.5 cm. IMPRESSION: 1. No acute intracranial abnormality. Stable atrophy and chronic microvascular ischemic changes. 2. No acute cervical spine fracture. Stable multilevel degenerative changes. 3. Slight interval enlargement of the right thyroid mass, now measuring 3.8 cm, previously 3.5 cm. Recommend thyroid ultrasound and possible tissue sampling if not previously performed. Electronically Signed   By: Obie Dredge [name]   On: 05/01/2017 09:33   Ct Cervical Spine Wo Contrast  Result  Date: 05/01/2017 CLINICAL DATA:  Found on the floor. EXAM: CT HEAD WITHOUT CONTRAST CT CERVICAL SPINE WITHOUT CONTRAST TECHNIQUE: Multidetector CT imaging of the head and cervical spine was performed following the standard protocol without intravenous contrast. Multiplanar CT image reconstructions of the cervical spine were also generated. COMPARISON:  CT head and cervical spine dated January 07, 2016. FINDINGS: CT HEAD FINDINGS Brain: No evidence of acute infarction, hemorrhage, hydrocephalus, extra-axial collection or mass lesion/mass effect. Stable atrophy and chronic microvascular ischemic changes. Vascular: Calcified atherosclerosis at the skullbase. No hyperdense vessel. Skull: Negative for fracture or focal lesion. Sinuses/Orbits: No acute finding. Other: Stable left frontal scalp subcutaneous lesion. CT CERVICAL SPINE FINDINGS Alignment: No traumatic malalignment. Skull base and vertebrae: No acute fracture. No primary bone lesion or focal pathologic process. Soft tissues and spinal canal: No prevertebral fluid or swelling. No visible canal hematoma. Disc levels: Moderate to severe degenerative disc disease and uncovertebral hypertrophy at C5-C6 and C6-C7, similar to prior study. Moderate to severe right-sided facet arthropathy throughout the cervical spine. Multilevel mild to moderate neuroforaminal stenosis. Upper chest: Mild centrilobular emphysema. Other: Slight interval enlargement of the right thyroid mass, now measuring 3.8 cm, previously 3.5 cm. IMPRESSION: 1. No acute intracranial abnormality. Stable atrophy and chronic microvascular ischemic changes. 2. No acute cervical spine fracture. Stable multilevel degenerative changes. 3. Slight interval enlargement of the right thyroid mass, now measuring 3.8 cm, previously 3.5 cm. Recommend thyroid ultrasound and possible tissue sampling if not previously performed. Electronically Signed   By: Obie Dredge [name]   On: 05/01/2017 09:33   Ct Pelvis Wo  Contrast  Result Date: 05/01/2017 CLINICAL DATA:  Right hip pain after fall.  Evaluate for fracture. EXAM: CT PELVIS WITHOUT CONTRAST TECHNIQUE: Multidetector CT imaging of the pelvis was performed following the standard protocol without intravenous contrast. COMPARISON:  Bilateral hip x-rays from same day. FINDINGS: Bones/Joint/Cartilage Prior right hip hemiarthroplasty. No hardware failure or loosening. Acute, nondisplaced fracture of the right inferior pubic ramus. No additional fracture seen. Normal alignment. No joint effusion. Mild left hip joint space narrowing. Advanced degenerative changes at L5-S1. Ligaments Ligaments are suboptimally evaluated by CT. Muscles and Tendons Atrophy of the right piriformis muscle. Soft tissue No fluid collection or hematoma.  No soft tissue mass. IMPRESSION: 1. Acute, nondisplaced fracture of the right inferior pubic ramus. 2. Prior right hip hemiarthroplasty without evidence of hardware complication. Electronically Signed   By: Obie Dredge [name]   On: 05/01/2017 11:49   Dg Humerus Right  Result Date: 05/01/2017 CLINICAL DATA:  Fall with right arm pain. EXAM: RIGHT HUMERUS -  2+ VIEW COMPARISON:  Right shoulder 08/25/2009 FINDINGS: Mild degenerate [facility] the [facility]. No evidence of fracture or dislocation. IMPRESSION: No acute findings. Electronically Signed   By: Elberta Fortisaniel  Boyle [name]   On: 05/01/2017 09:05   Dg Hips Bilat W Or Wo Pelvis 3-4 Views  Result Date: 05/01/2017 CLINICAL DATA:  Fall with left hip pain. EXAM: DG HIP (WITH OR WITHOUT PELVIS) 3-4V BILAT COMPARISON:  02/19/2013 FINDINGS: Right hip arthroplasty unchanged. Mild degenerate change of the left hip. No definite left hip fracture or dislocation. Mild focal irregularity over the right inferior pubic ramus which may represent a nondisplaced fracture. Degenerate change of the spine. IMPRESSION: No acute left hip findings. Focal cortical regularity over the right inferior pubic ramus as this may  represent a nondisplaced fracture. Consider CT for further evaluation. Right hip arthroplasty unchanged. Electronically Signed   By: Elberta Fortisaniel  Boyle [name]   On: 05/01/2017 09:10      Subjective: Patient seen and examined at bedside.  She is awake but confused.  Spoke to daughter at bedside.  No overnight fever, nausea or vomiting.  Discharge Exam: Vitals:   05/04/17 0157 05/04/17 0524  BP: (!) 147/74 (!) 182/91  Pulse: 79 98  Resp: 18 20  Temp: 98.2 F (36.8 C) 98.6 F (37 C)  SpO2: 96% 98%   Vitals:   05/03/17 1516 05/03/17 2135 05/04/17 0157 05/04/17 0524  BP: 135/80 (!) 158/67 (!) 147/74 (!) 182/91  Pulse: 86 97 79 98  Resp: 18 20 18 20   Temp: 98.7 F (37.1 C)  98.2 F (36.8 C) 98.6 F (37 C)  TempSrc: Oral  Axillary Oral  SpO2: 98% 99% 96% 98%  Weight:      Height:        General: Pt is awake but confused.  No acute distress Cardiovascular: Rate controlled, S1-S2 positive  respiratory: Bilateral decreased breath sounds at bases with some scattered crackles Abdominal: Soft, NT, ND, bowel sounds + Extremities: Trace edema, no cyanosis    The results of significant diagnostics from this hospitalization (including imaging, microbiology, ancillary and laboratory) are listed below for reference.     Microbiology: Recent Results (from the past 240 hour(s))  MRSA PCR Screening     Status: None   Collection Time: 05/01/17  6:00 PM  Result Value Ref Range Status   MRSA by PCR NEGATIVE NEGATIVE Final    Comment:        The GeneXpert MRSA Assay (FDA approved for NASAL specimens only), is one component of a comprehensive MRSA colonization surveillance program. It is not intended to diagnose MRSA infection nor to guide or monitor treatment for MRSA infections. Performed at [facility], [address] [place], [place] [id number]      Labs: BNP (last 3 results) No results for input(s): BNP in the last 8760 hours. Basic Metabolic Panel: Recent Labs  Lab  05/01/17 1115 05/02/17 0338 05/03/17 0014 05/04/17 0315  NA 138 137 134* 139  K 4.0 3.9 3.8 3.4*  CL 104 104 102 100*  CO2 22 21* 20* 24  GLUCOSE 157* 147* 193* 119*  BUN 18 12 15 17   CREATININE 0.83 0.77 0.93 0.84  CALCIUM 9.4 8.9 9.0 9.3  MG  --   --  1.8 1.9   Liver Function Tests: No results for input(s): AST, ALT, ALKPHOS, BILITOT, PROT, ALBUMIN in the last 168 hours. No results for input(s): LIPASE, AMYLASE in the last 168 hours. No results for input(s): AMMONIA  in the last 168 hours. CBC: Recent Labs  Lab 05/01/17 1115 05/01/17 1919 05/02/17 0338 05/03/17 0014 05/04/17 0315  WBC 16.5* 14.8* 15.6* 16.5* 14.9*  NEUTROABS 14.1*  --   --  14.2* 11.5*  HGB 14.7 15.0 14.6 14.8 14.9  HCT 43.6 44.4 43.4 43.6 44.1  MCV 98.0 98.0 98.0 97.8 97.6  PLT 239 250 251 204 205   Cardiac Enzymes: No results for input(s): CKTOTAL, CKMB, CKMBINDEX, TROPONINI in the last 168 hours. BNP: Invalid input(s): POCBNP CBG: Recent Labs  Lab 05/03/17 0646 05/03/17 0838 05/03/17 1156 05/03/17 1632 05/04/17 0828  GLUCAP 116* 117* 140* 108* 125*   D-Dimer No results for input(s): DDIMER in the last 72 hours. Hgb A1c No results for input(s): HGBA1C in the last 72 hours. Lipid Profile No results for input(s): CHOL, HDL, LDLCALC, TRIG, CHOLHDL, LDLDIRECT in the last 72 hours. Thyroid function studies No results for input(s): TSH, T4TOTAL, T3FREE, THYROIDAB in the last 72 hours.  Invalid input(s): FREET3 Anemia work up No results for input(s): VITAMINB12, FOLATE, FERRITIN, TIBC, IRON, RETICCTPCT in the last 72 hours. Urinalysis    Component Value Date/Time   COLORURINE YELLOW 05/01/2017 1419   APPEARANCEUR CLEAR 05/01/2017 1419   LABSPEC 1.015 05/01/2017 1419   PHURINE 7.0 05/01/2017 1419   GLUCOSEU NEGATIVE 05/01/2017 1419   HGBUR NEGATIVE 05/01/2017 1419   BILIRUBINUR NEGATIVE 05/01/2017 1419   KETONESUR NEGATIVE 05/01/2017 1419   PROTEINUR NEGATIVE 05/01/2017 1419    UROBILINOGEN 0.2 02/19/2013 1252   NITRITE NEGATIVE 05/01/2017 1419   LEUKOCYTESUR SMALL (A) 05/01/2017 1419   Sepsis Labs Invalid input(s): PROCALCITONIN,  WBC,  LACTICIDVEN Microbiology Recent Results (from the past 240 hour(s))  MRSA PCR Screening     Status: None   Collection Time: 05/01/17  6:00 PM  Result Value Ref Range Status   MRSA by PCR NEGATIVE NEGATIVE Final    Comment:        The GeneXpert MRSA Assay (FDA approved for NASAL specimens only), is one component of a comprehensive MRSA colonization surveillance program. It is not intended to diagnose MRSA infection nor to guide or monitor treatment for MRSA infections. Performed at [facility], [address], [place], [place] [address]      Time coordinating discharge: 40 minutes  SIGNED:   Glade Lloyd, MD  [facility] 05/04/2017, 10:19 AM Pager: [phone]  If 7PM-7AM, please contact night-coverage [email or web address] Password [email or web address]

## 2017-05-04 NOTE — Progress Notes (Signed)
[facility] pt is without qualiying diagnosis for oxygen.Pt would have to sign ABN and likely be responsible for oxygen cost. NCM made daughter Lynden AngCathy aware. Gae GallopAngela Elnora Quizon RN,BSN,CM

## 2017-05-04 NOTE — Progress Notes (Signed)
CSW called [facility] at [phone] to see if pt's oxygen has been delivered.   CSW received phone call from pt's nurse that oxygen has been delivered.   Montine CircleKelsy Janete Quilling, Silverio LayLCSWA [facility]  [phone]

## 2017-05-08 LAB — CULTURE, BLOOD (ROUTINE X 2)
CULTURE: NO GROWTH
CULTURE: NO GROWTH
SPECIAL REQUESTS: ADEQUATE
Special Requests: ADEQUATE

## 2017-05-09 ENCOUNTER — Encounter (HOSPITAL_COMMUNITY): Payer: Self-pay

## 2017-05-09 ENCOUNTER — Inpatient Hospital Stay (HOSPITAL_COMMUNITY)
Admission: EM | Admit: 2017-05-09 | Discharge: 2017-05-14 | DRG: 177 | Disposition: E | Payer: Medicare Other | Attending: Internal Medicine | Admitting: Internal Medicine

## 2017-05-09 ENCOUNTER — Emergency Department (HOSPITAL_COMMUNITY): Payer: Medicare Other

## 2017-05-09 ENCOUNTER — Other Ambulatory Visit: Payer: Self-pay

## 2017-05-09 ENCOUNTER — Inpatient Hospital Stay (HOSPITAL_COMMUNITY): Payer: Medicare Other

## 2017-05-09 DIAGNOSIS — I110 Hypertensive heart disease with heart failure: Secondary | ICD-10-CM | POA: Diagnosis not present

## 2017-05-09 DIAGNOSIS — Z79899 Other long term (current) drug therapy: Secondary | ICD-10-CM

## 2017-05-09 DIAGNOSIS — R627 Adult failure to thrive: Secondary | ICD-10-CM | POA: Diagnosis not present

## 2017-05-09 DIAGNOSIS — I2510 Atherosclerotic heart disease of native coronary artery without angina pectoris: Secondary | ICD-10-CM | POA: Diagnosis not present

## 2017-05-09 DIAGNOSIS — R000 Tachycardia, unspecified: Secondary | ICD-10-CM | POA: Diagnosis not present

## 2017-05-09 DIAGNOSIS — I482 Chronic atrial fibrillation: Secondary | ICD-10-CM | POA: Diagnosis present

## 2017-05-09 DIAGNOSIS — F03918 Unspecified dementia, unspecified severity, with other behavioral disturbance: Secondary | ICD-10-CM

## 2017-05-09 DIAGNOSIS — J690 Pneumonitis due to inhalation of food and vomit: Principal | ICD-10-CM | POA: Diagnosis present

## 2017-05-09 DIAGNOSIS — Z66 Do not resuscitate: Secondary | ICD-10-CM | POA: Diagnosis present

## 2017-05-09 DIAGNOSIS — J9601 Acute respiratory failure with hypoxia: Secondary | ICD-10-CM | POA: Diagnosis present

## 2017-05-09 DIAGNOSIS — E785 Hyperlipidemia, unspecified: Secondary | ICD-10-CM | POA: Diagnosis not present

## 2017-05-09 DIAGNOSIS — F0391 Unspecified dementia with behavioral disturbance: Secondary | ICD-10-CM | POA: Diagnosis not present

## 2017-05-09 DIAGNOSIS — R296 Repeated falls: Secondary | ICD-10-CM | POA: Diagnosis present

## 2017-05-09 DIAGNOSIS — M8580 Other specified disorders of bone density and structure, unspecified site: Secondary | ICD-10-CM | POA: Diagnosis present

## 2017-05-09 DIAGNOSIS — E7800 Pure hypercholesterolemia, unspecified: Secondary | ICD-10-CM | POA: Diagnosis present

## 2017-05-09 DIAGNOSIS — E860 Dehydration: Secondary | ICD-10-CM | POA: Diagnosis present

## 2017-05-09 DIAGNOSIS — E875 Hyperkalemia: Secondary | ICD-10-CM | POA: Diagnosis present

## 2017-05-09 DIAGNOSIS — E118 Type 2 diabetes mellitus with unspecified complications: Secondary | ICD-10-CM | POA: Diagnosis not present

## 2017-05-09 DIAGNOSIS — I5032 Chronic diastolic (congestive) heart failure: Secondary | ICD-10-CM | POA: Diagnosis present

## 2017-05-09 DIAGNOSIS — R4182 Altered mental status, unspecified: Secondary | ICD-10-CM | POA: Diagnosis present

## 2017-05-09 DIAGNOSIS — G9341 Metabolic encephalopathy: Secondary | ICD-10-CM | POA: Diagnosis present

## 2017-05-09 DIAGNOSIS — Z515 Encounter for palliative care: Secondary | ICD-10-CM

## 2017-05-09 DIAGNOSIS — IMO0002 Reserved for concepts with insufficient information to code with codable children: Secondary | ICD-10-CM | POA: Diagnosis present

## 2017-05-09 DIAGNOSIS — H109 Unspecified conjunctivitis: Secondary | ICD-10-CM | POA: Diagnosis present

## 2017-05-09 DIAGNOSIS — E1165 Type 2 diabetes mellitus with hyperglycemia: Secondary | ICD-10-CM | POA: Diagnosis not present

## 2017-05-09 DIAGNOSIS — Z951 Presence of aortocoronary bypass graft: Secondary | ICD-10-CM

## 2017-05-09 DIAGNOSIS — Z87891 Personal history of nicotine dependence: Secondary | ICD-10-CM

## 2017-05-09 DIAGNOSIS — E079 Disorder of thyroid, unspecified: Secondary | ICD-10-CM | POA: Diagnosis present

## 2017-05-09 LAB — CBC
HEMATOCRIT: 45 % (ref 36.0–46.0)
HEMOGLOBIN: 15.1 g/dL — AB (ref 12.0–15.0)
MCH: 32.8 pg (ref 26.0–34.0)
MCHC: 33.6 g/dL (ref 30.0–36.0)
MCV: 97.6 fL (ref 78.0–100.0)
Platelets: 309 10*3/uL (ref 150–400)
RBC: 4.61 MIL/uL (ref 3.87–5.11)
RDW: 14.9 % (ref 11.5–15.5)
WBC: 9.1 10*3/uL (ref 4.0–10.5)

## 2017-05-09 LAB — BASIC METABOLIC PANEL
ANION GAP: 11 (ref 5–15)
BUN: 22 mg/dL — ABNORMAL HIGH (ref 6–20)
CO2: 24 mmol/L (ref 22–32)
Calcium: 8.7 mg/dL — ABNORMAL LOW (ref 8.9–10.3)
Chloride: 101 mmol/L (ref 101–111)
Creatinine, Ser: 0.84 mg/dL (ref 0.44–1.00)
GFR calc Af Amer: >60 mL/min (ref >60)
GLUCOSE: 137 mg/dL — AB (ref 65–99)
POTASSIUM: 5.2 mmol/L — AB (ref 3.5–5.1)
Sodium: 136 mmol/L (ref 135–145)

## 2017-05-09 LAB — I-STAT TROPONIN, ED: TROPONIN I, POC: 0.02 ng/mL (ref 0.00–0.08)

## 2017-05-09 LAB — BRAIN NATRIURETIC PEPTIDE: B Natriuretic Peptide: 285.4 pg/mL — ABNORMAL HIGH (ref 0.0–100.0)

## 2017-05-09 LAB — CBG MONITORING, ED: GLUCOSE-CAPILLARY: 88 mg/dL (ref 65–99)

## 2017-05-09 MED ORDER — INSULIN ASPART 100 UNIT/ML ~~LOC~~ SOLN: 0.0000 [IU] | Freq: Every day | SUBCUTANEOUS | Status: DC

## 2017-05-09 MED ORDER — ERYTHROMYCIN 5 MG/GM OP OINT
1.0000 "application " | TOPICAL_OINTMENT | Freq: Four times a day (QID) | OPHTHALMIC | Status: DC
  Administered 2017-05-09 – 2017-05-12 (×9): 1 via OPHTHALMIC
  Filled 2017-05-09 (×2): qty 3.5

## 2017-05-09 MED ORDER — INSULIN ASPART 100 UNIT/ML ~~LOC~~ SOLN: 0.0000 [IU] | Freq: Three times a day (TID) | SUBCUTANEOUS | Status: DC

## 2017-05-09 MED ORDER — SODIUM POLYSTYRENE SULFONATE 15 GM/60ML PO SUSP
15.0000 g | Freq: Once | ORAL | Status: AC
  Administered 2017-05-09: 15 g via ORAL
  Filled 2017-05-09: qty 60

## 2017-05-09 NOTE — ED Notes (Signed)
Pt given apple sauce

## 2017-05-09 NOTE — Progress Notes (Signed)
[facility] ED E46 -- Hospice and [facility]) RN Visit  Received phone call from [place], Care Management requesting evaluation for possible [facility] placement. Reviewed chart briefly and received report from Dr. Tomi Bamberger.  Met with patient, spouse, daughter and 2 sons in room to explain residential hospice, hospice services provided wherever a patient calls home and Palliative Medicine Services. Family had requested residential hospice services, though patient does not meet residential hospice eligibility requirements.  Listened supportively and all questions were answered.   Family now requesting patient to be placed in SNF with rehabilitation services. Informed family I would let Mariann Laster know of their wishes for her to follow through with request.  Updated Dr. Tomi Bamberger, [place], RN and Mariann Laster, Care Management.  Thank you,  Margaretmary Eddy, RN, [facility] Liaison [phone]  [place] are on AMION

## 2017-05-09 NOTE — ED Notes (Signed)
Called bed placement in regards to pt status, stated this pt should receive the next available bed and they are just waiting on housekeeping.

## 2017-05-09 NOTE — ED Triage Notes (Signed)
[facility], pt recently diagnosed with pneumonia. Pt seen and hospitalized last Saturday for a fall. Pt also fell this past Tuesday after her return to her facility. Pt now on home O2, staff at facility found pt w/o nasal cannula for unknown amount of time. Pt has hx of CHF and afib. Family would like a social work consult to have pt placed in hospice. Pt A+Ox1 and is at baseline.

## 2017-05-09 NOTE — H&P (Incomplete)
TRH H&P   Patient Demographics:    Yvonne Butler, is a 84 y.o. female  MRN: [id number]   DOB - [birth year]  Admit Date - 04/19/2017  Outpatient Primary MD for the patient is Patient, No Pcp Per Lajean Manes  Referring MD/NP/PA: Marye Round  Outpatient Specialists:   Patient coming from: [facility]  Chief Complaint  Patient presents with  . Shortness of Breath      HPI:    Yvonne Butler  is a 84 y.o. female, w CAD bypass grafting in 2000 (including a LIMA to LAD, SVG to diagonal, SVG to OM, SVG to PDA), Chronic Afib, Dm2, Hypertension, Hyperlipidemia, recent fall with pubic rami fracture has apparently had some altered mental status.   Pt has baseline dementia and is a very poor historian.   In ED,  Pt axox2,  Pt is on xanax as well as tramadol, and oxycodone 60m 1-2 po q6h prn.  Its unclear how much of the medication she is receiving.   CXR 04/19/2017=> improved CHF, persistent left pleural effsuion with associated atelectasis.    Reviewed prior CT brain 05/01/2017=>  1. No acute intracranial abnormality. Stable atrophy and chronic microvascular ischemic changes. 2. No acute cervical spine fracture. Stable multilevel degenerative changes. 3. Slight interval enlargement of the right thyroid mass, now measuring 3.8 cm, previously 3.5 cm. Recommend thyroid ultrasound and possible tissue sampling if not previously performed.  Na 136, K 5.2, Bun 22, Creatinine 0.84  Wbc 9.1, Hgb 15.1, Plt 309  BNP 285  Trop I 0.02  Pt will be admitted for AMS as well as pain from pubic rami fracture as well as ? CHF     Review of systems:    In addition to the HPI above,  No Fever-chills, No Headache, No changes with Vision or hearing, No problems swallowing food or Liquids, No Chest pain, Cough or Shortness of Breath, No Abdominal pain, No Nausea or Vommitting, Bowel  movements are regular, No Blood in stool or Urine, No dysuria, No new skin rashes or bruises,  No new weakness, tingling, numbness in any extremity, No recent weight gain or loss, No polyuria, polydypsia or polyphagia, No significant Mental Stressors.  A full 10 point Review of Systems was done, except as stated above, all other Review of Systems were negative.   With Past History of the following :    Past Medical History:  Diagnosis Date  . CAD (coronary artery disease)    with CABG- Dr. STamala Julian . Complication of anesthesia   . Coronary atherosclerosis of native coronary artery   . [place]   . Diverticulosis   . Falls frequently 05/01/2017  . Hypercholesteremia   . Hyperlipidemia   . Osteopenia   . PONV (postoperative nausea and vomiting)   . Vitamin D deficiency       Past Surgical History:  Procedure  Laterality Date  . APPENDECTOMY    . CORONARY ARTERY BYPASS GRAFT      in with LIMA to LAD, SVG to the circumflex, SVG to the diagonal, and SVG to the PDA.  [name] HIP ARTHROPLASTY Right 02/19/2013   Procedure: HEMI ARTHROPLASTY BIPOLAR HIP CEMENTED VS UNCEMENTED;  Surgeon: Alta Corning, MD;  Location: [place];  Service: Orthopedics;  Laterality: Right;  . HIP SURGERY Right 02/20/2013   HEMI -BIPOLAR ARTHROPLASTY  . Ovarian Cyst & Fallopian Tubes        Social History:     Social History   Tobacco Use  . Smoking status: Former Smoker    Types: Cigarettes  . Smokeless tobacco: Never Used  Substance Use Topics  . Alcohol use: No     Lives - at [facility] - unclear if she walks    Family History :     Family History  Problem Relation Age of Onset  . Heart disease Father       Home Medications:   Prior to Admission medications   Medication Sig Start Date End Date Taking? Authorizing Provider  amoxicillin-clavulanate (AUGMENTIN) 875-125 MG tablet Take 1 tablet by mouth 2 (two) times daily for 5 days. 05/04/17 05/10/2017  Aline August,  MD  bisacodyl (DULCOLAX) 10 MG suppository Place 1 suppository (10 mg total) rectally daily as needed for moderate constipation. 05/04/17   Aline August, MD  busPIRone (BUSPAR) 10 MG tablet Take 10 mg by mouth 2 (two) times daily. 11/22/16   [provider]  camphor-menthol Timoteo Ace) lotion Apply 1 application topically 3 (three) times daily.    [provider]  furosemide (LASIX) 20 MG tablet Take 2 tablets (40 mg total) by mouth daily. 05/04/17   Aline August, MD  glimepiride (AMARYL) 1 MG tablet Take 1 mg by mouth See admin instructions. Taking one tablet with breakfast and 1 tablet with lunch daily 04/23/17   [provider]  guaiFENesin (ROBITUSSIN) 100 MG/5ML SOLN Take 10 mLs by mouth every 6 (six) hours as needed for cough or to loosen phlegm.    [provider]  hydrocortisone 2.5 % cream Apply 1 application topically daily as needed for itching. 03/23/17   [provider]  hydrOXYzine (ATARAX/VISTARIL) 10 MG tablet Take 10 mg by mouth 3 (three) times daily. 11/20/16   [provider]  lisinopril (PRINIVIL,ZESTRIL) 5 MG tablet Take 5 mg by mouth daily. 04/23/17   [provider]  metoprolol tartrate (LOPRESSOR) 50 MG tablet Take 1 tablet (50 mg total) by mouth 2 (two) times daily. 05/04/17   Aline August, MD  Multiple Vitamin (MULTIVITAMIN) tablet Take 1 tablet by mouth daily.    [provider]  Multiple Vitamins-Minerals (PRESERVISION AREDS 2) CAPS Take 1 capsule by mouth 2 (two) times daily.    [provider]  oxyCODONE (OXY IR/ROXICODONE) 5 MG immediate release tablet Take 1-2 tablets (5-10 mg total) by mouth every 6 (six) hours as needed for moderate pain. 05/04/17   Aline August, MD  senna-docusate (SENOKOT-S) 8.6-50 MG tablet Take 1 tablet by mouth 2 (two) times daily. 05/04/17   Aline August, MD  traMADol-acetaminophen (ULTRACET) 37.5-325 MG tablet Take 1 tablet by mouth 2 (two) times daily. 05/04/17   Aline August, MD     Allergies:    No Known Allergies   Physical Exam:   Vitals  Blood pressure (!) 143/73, pulse 81, temperature (!) 97.5 F (36.4 C), temperature source Oral, resp. rate [name])  22, SpO2 100 %.   1. General  lying in bed in NAD,    2. Normal affect and insight, Not Suicidal or Homicidal, Awake Alert, Oriented X 2(person, place).  3. No F.N deficits, ALL C.Nerves Intact, Strength 5/5 all 4 extremities, Sensation intact all 4 extremities, Plantars down going.  4. Ears and Eyes appear Normal, Conjunctivae clear, PERRLA. Moist Oral Mucosa.  5. Supple Neck, slight JVD, No cervical lymphadenopathy appriciated, No Carotid Bruits.  6. Symmetrical Chest wall movement, Good air movement bilaterally, slight crackle bilateral base, no wheezing  7. RRR, s1, s2, 1/6 sem apex  8. Positive Bowel Sounds, Abdomen Soft, No tenderness, No organomegaly appriciated,No rebound -guarding or rigidity.  9.  No Cyanosis, Normal Skin Turgor, No Skin Rash or Bruise.  10. Good muscle tone,  joints appear normal , no effusions, Normal ROM.  11. No Palpable Lymph Nodes in Neck or Axillae  **************   Data Review:    CBC Recent Labs  Lab 05/03/17 0014 05/04/17 0315 05/04/2017 1248  WBC 16.5* 14.9* 9.1  HGB 14.8 14.9 15.1*  HCT 43.6 44.1 45.0  PLT 204 205 309  MCV 97.8 97.6 97.6  MCH 33.2 33.0 32.8  MCHC 33.9 33.8 33.6  RDW 14.8 14.7 14.9  LYMPHSABS 1.0 1.1  --   MONOABS 1.1* 1.4*  --   EOSABS 0.2 0.9*  --   BASOSABS 0.0 0.0  --    ------------------------------------------------------------------------------------------------------------------  Chemistries  Recent Labs  Lab 05/03/17 0014 05/04/17 0315 05/07/2017 1248  NA 134* 139 136  K 3.8 3.4* 5.2*  CL 102 100* 101  CO2 20* 24 24  GLUCOSE 193* 119* 137*  BUN 15 17 22*  CREATININE 0.93 0.84 0.84  CALCIUM 9.0 9.3 8.7*  MG 1.8 1.9  --     ------------------------------------------------------------------------------------------------------------------ estimated creatinine clearance is 41.5 mL/min (by C-G formula based on SCr of 0.84 mg/dL). ------------------------------------------------------------------------------------------------------------------ No results for input(s): TSH, T4TOTAL, T3FREE, THYROIDAB in the last 72 hours.  Invalid input(s): FREET3  Coagulation profile No results for input(s): INR, PROTIME in the last 168 hours. ------------------------------------------------------------------------------------------------------------------- No results for input(s): DDIMER in the last 72 hours. -------------------------------------------------------------------------------------------------------------------  Cardiac Enzymes No results for input(s): CKMB, TROPONINI, MYOGLOBIN in the last 168 hours.  Invalid input(s): CK ------------------------------------------------------------------------------------------------------------------    Component Value Date/Time   BNP 285.4 (H) 05/05/2017 1248     ---------------------------------------------------------------------------------------------------------------  Urinalysis    Component Value Date/Time   COLORURINE YELLOW 05/01/2017 1419   APPEARANCEUR CLEAR 05/01/2017 1419   LABSPEC 1.015 05/01/2017 1419   PHURINE 7.0 05/01/2017 1419   GLUCOSEU NEGATIVE 05/01/2017 1419   HGBUR NEGATIVE 05/01/2017 1419   BILIRUBINUR NEGATIVE 05/01/2017 1419   KETONESUR NEGATIVE 05/01/2017 1419   PROTEINUR NEGATIVE 05/01/2017 1419   UROBILINOGEN 0.2 02/19/2013 1252   NITRITE NEGATIVE 05/01/2017 1419   LEUKOCYTESUR SMALL (A) 05/01/2017 1419    ----------------------------------------------------------------------------------------------------------------   Imaging Results:    Dg Chest Port 1 View  Result Date: 05/01/2017 CLINICAL DATA:  Shortness of breath. EXAM:  PORTABLE CHEST 1 VIEW COMPARISON:  05/03/2017 FINDINGS: Status post CABG surgery. Cardiac silhouette is mildly enlarged. No mediastinal or hilar masses. Previously seen vascular congestion and interstitial thickening has improved. Opacity at the left lung base has also improved. Residual opacity obscures hemidiaphragm likely due to combination of pleural fluid with atelectasis. No new lung abnormalities.  No pneumothorax. Skeletal structures are grossly intact. IMPRESSION: 1. Improved congestive heart failure. 2. Persistent small left pleural effusion with associated atelectasis. Electronically Signed  By: Lajean Manes [name]   On: 04/26/2017 12:49    ekg reader not working     Assessment & Plan:    Principal Problem:   Altered mental status Active Problems:   Hyperkalemia   Type II diabetes mellitus with complication, uncontrolled (HCC)    AMS MRI brain Check b12, folate, esr, tsh, ana rpr  Hyperkalemia Kayexalate15gm po x1 Check cmp in am  CHF (EF 60-65%), mild MR Cont LIsinopril Cont Furosemide 14m po qday  DM2 Cont Amaryl 157mpo qday fsbs ac and qhs, ISS  Dementia    DVT Prophylaxis Heparin -  Lovenox - SCDs ***  AM Labs Ordered, also please review Full Orders  Family Communication: Admission, patients condition and plan of care including tests being ordered have been discussed with the patient and **** who indicate understanding and agree with the plan and Code Status.  Code Status ***  Likely DC to  ***  Condition GUARDED  *******  Consults called: ***    Admission status: ***    Time spent in minutes : ***   JaJani Gravel.D on 04/29/2017 at 6:14 PM  Between 7am to 7pm - Pager - [phone] 7pm go to [email or web address] - password [email or web address] - Office  [phone]

## 2017-05-09 NOTE — H&P (Addendum)
TRH H&P   Patient Demographics:    Yvonne Butler, is a 84 y.o. female  MRN: [id number]   DOB - 1934-02-08  Admit Date - 04/25/2017  Outpatient Primary MD for the patient is Patient, No Pcp Per Lajean Manes  Referring MD/NP/PA: Marye Round  Outpatient Specialists:   Patient coming from: [facility]  Chief Complaint  Patient presents with  . Shortness of Breath      HPI:    Yvonne Butler  is a 84 y.o. female, w CAD bypass grafting in 2000 (including a LIMA to LAD, SVG to diagonal, SVG to OM, SVG to PDA), Chronic Afib, Dm2, Hypertension, Hyperlipidemia, recent fall with pubic rami fracture has apparently had some altered mental status.     In ED,  Pt axox2,  Pt is on xanax as well as tramadol, and oxycodone 70m 1-2 po q6h prn.  Its unclear how much of the medication she is receiving.   CXR 04/29/2017=> improved CHF, persistent left pleural effsuion with associated atelectasis.    Reviewed prior CT brain 05/01/2017=>  1. No acute intracranial abnormality. Stable atrophy and chronic microvascular ischemic changes. 2. No acute cervical spine fracture. Stable multilevel degenerative changes. 3. Slight interval enlargement of the right thyroid mass, now measuring 3.8 cm, previously 3.5 cm. Recommend thyroid ultrasound and possible tissue sampling if not previously performed.  Na 136, K 5.2, Bun 22, Creatinine 0.84  Wbc 9.1, Hgb 15.1, Plt 309  BNP 285  Trop I 0.02  Pt will be admitted for AMS as well as pain from pubic rami fracture as well as ? CHF       Review of systems:    In addition to the HPI above,  No Fever-chills, No Headache, No changes with Vision or hearing, No problems swallowing food or Liquids, No Chest pain, Cough or Shortness of Breath, No Abdominal pain, No Nausea or Vommitting, Bowel movements are regular, No Blood in  stool or Urine, No dysuria, No new skin rashes or bruises,   No new weakness, tingling, numbness in any extremity, No recent weight gain or loss, No polyuria, polydypsia or polyphagia, No significant Mental Stressors.  A full 10 point Review of Systems was done, except as stated above, all other Review of Systems were negative.   With Past History of the following :    Past Medical History:  Diagnosis Date  . CAD (coronary artery disease)    with CABG- Dr. STamala Julian . Complication of anesthesia   . Coronary atherosclerosis of native coronary artery   . [place]   . Diverticulosis   . Falls frequently 05/01/2017  . Hypercholesteremia   . Hyperlipidemia   . Osteopenia   . PONV (postoperative nausea and vomiting)   . Vitamin D deficiency       Past Surgical History:  Procedure Laterality Date  . APPENDECTOMY    .  CORONARY ARTERY BYPASS GRAFT      in with LIMA to LAD, SVG to the circumflex, SVG to the diagonal, and SVG to the PDA.  [name] HIP ARTHROPLASTY Right 02/19/2013   Procedure: HEMI ARTHROPLASTY BIPOLAR HIP CEMENTED VS UNCEMENTED;  Surgeon: Alta Corning, MD;  Location: [place];  Service: Orthopedics;  Laterality: Right;  . HIP SURGERY Right 02/20/2013   HEMI -BIPOLAR ARTHROPLASTY  . Ovarian Cyst & Fallopian Tubes        Social History:     Social History   Tobacco Use  . Smoking status: Former Smoker    Types: Cigarettes  . Smokeless tobacco: Never Used  Substance Use Topics  . Alcohol use: No     Lives - at [employer] [facility] - unclear if able to walk   Family History :     Family History  Problem Relation Age of Onset  . Heart disease Father       Home Medications:   Prior to Admission medications   Medication Sig Start Date End Date Taking? Authorizing Provider  amoxicillin-clavulanate (AUGMENTIN) 875-125 MG tablet Take 1 tablet by mouth 2 (two) times daily for 5 days. 05/04/17 04/25/2017  Aline August, MD  bisacodyl (DULCOLAX) 10 MG  suppository Place 1 suppository (10 mg total) rectally daily as needed for moderate constipation. 05/04/17   Aline August, MD  busPIRone (BUSPAR) 10 MG tablet Take 10 mg by mouth 2 (two) times daily. 11/22/16   [provider]  camphor-menthol Timoteo Ace) lotion Apply 1 application topically 3 (three) times daily.    [provider]  furosemide (LASIX) 20 MG tablet Take 2 tablets (40 mg total) by mouth daily. 05/04/17   Aline August, MD  glimepiride (AMARYL) 1 MG tablet Take 1 mg by mouth See admin instructions. Taking one tablet with breakfast and 1 tablet with lunch daily 04/23/17   [provider]  guaiFENesin (ROBITUSSIN) 100 MG/5ML SOLN Take 10 mLs by mouth every 6 (six) hours as needed for cough or to loosen phlegm.    [provider]  hydrocortisone 2.5 % cream Apply 1 application topically daily as needed for itching. 03/23/17   [provider]  hydrOXYzine (ATARAX/VISTARIL) 10 MG tablet Take 10 mg by mouth 3 (three) times daily. 11/20/16   [provider]  lisinopril (PRINIVIL,ZESTRIL) 5 MG tablet Take 5 mg by mouth daily. 04/23/17   [provider]  metoprolol tartrate (LOPRESSOR) 50 MG tablet Take 1 tablet (50 mg total) by mouth 2 (two) times daily. 05/04/17   Aline August, MD  Multiple Vitamin (MULTIVITAMIN) tablet Take 1 tablet by mouth daily.    [provider]  Multiple Vitamins-Minerals (PRESERVISION AREDS 2) CAPS Take 1 capsule by mouth 2 (two) times daily.    [provider]  oxyCODONE (OXY IR/ROXICODONE) 5 MG immediate release tablet Take 1-2 tablets (5-10 mg total) by mouth every 6 (six) hours as needed for moderate pain. 05/04/17   Aline August, MD  senna-docusate (SENOKOT-S) 8.6-50 MG tablet Take 1 tablet by mouth 2 (two) times daily. 05/04/17   Aline August, MD  traMADol-acetaminophen (ULTRACET) 37.5-325 MG tablet Take 1 tablet by mouth 2 (two) times daily. 05/04/17   Aline August, MD     Allergies:     No Known Allergies   Physical Exam:   Vitals  Blood pressure (!) 143/73, pulse 81, temperature (!) 97.5 F (36.4 C), temperature source Oral, resp. rate (!) 22, SpO2 100 %.   1. General  lying in bed in NAD,    2. Normal affect and insight, Not Suicidal or Homicidal, Awake Alert, Oriented X 2 (person, place).  3. No F.N deficits, ALL C.Nerves Intact, Strength 5/5 all 4 extremities, Sensation intact all 4 extremities, Plantars down going.  4. Ears and Eyes appear Normal, Conjunctivae clear, PERRLA. Moist Oral Mucosa.  5. Supple Neck, slight  JVD, No cervical lymphadenopathy appriciated, No Carotid Bruits.  6. Symmetrical Chest wall movement, Good air movement bilaterally, slight crackles bilateral base, no wheezing  7. RRR, s1, s2, 1/6 sem apex  8. Positive Bowel Sounds, Abdomen Soft, No tenderness, No organomegaly appriciated,No rebound -guarding or rigidity.  9.  No Cyanosis, Normal Skin Turgor, No Skin Rash or Bruise.  10. Good muscle tone,  joints appear normal , no effusions, Normal ROM.  11. No Palpable Lymph Nodes in Neck or Axillae     Data Review:    CBC Recent Labs  Lab 05/03/17 0014 05/04/17 0315 05/01/2017 1248  WBC 16.5* 14.9* 9.1  HGB 14.8 14.9 15.1*  HCT 43.6 44.1 45.0  PLT 204 205 309  MCV 97.8 97.6 97.6  MCH 33.2 33.0 32.8  MCHC 33.9 33.8 33.6  RDW 14.8 14.7 14.9  LYMPHSABS 1.0 1.1  --   MONOABS 1.1* 1.4*  --   EOSABS 0.2 0.9*  --   BASOSABS 0.0 0.0  --    ------------------------------------------------------------------------------------------------------------------  Chemistries  Recent Labs  Lab 05/03/17 0014 05/04/17 0315 04/23/2017 1248  NA 134* 139 136  K 3.8 3.4* 5.2*  CL 102 100* 101  CO2 20* 24 24  GLUCOSE 193* 119* 137*  BUN 15 17 22*  CREATININE 0.93 0.84 0.84  CALCIUM 9.0 9.3 8.7*  MG 1.8 1.9  --     ------------------------------------------------------------------------------------------------------------------ estimated creatinine clearance is 41.5 mL/min (by C-G formula based on SCr of 0.84 mg/dL). ------------------------------------------------------------------------------------------------------------------ No results for input(s): TSH, T4TOTAL, T3FREE, THYROIDAB in the last 72 hours.  Invalid input(s): FREET3  Coagulation profile No results for input(s): INR, PROTIME in the last 168 hours. ------------------------------------------------------------------------------------------------------------------- No results for input(s): DDIMER in the last 72 hours. -------------------------------------------------------------------------------------------------------------------  Cardiac Enzymes No results for input(s): CKMB, TROPONINI, MYOGLOBIN in the last 168 hours.  Invalid input(s): CK ------------------------------------------------------------------------------------------------------------------    Component Value Date/Time   BNP 285.4 (H) 05/05/2017 1248     ---------------------------------------------------------------------------------------------------------------  Urinalysis    Component Value Date/Time   COLORURINE YELLOW 05/01/2017 1419   APPEARANCEUR CLEAR 05/01/2017 1419   LABSPEC 1.015 05/01/2017 1419   PHURINE 7.0 05/01/2017 1419   GLUCOSEU NEGATIVE 05/01/2017 1419   HGBUR NEGATIVE 05/01/2017 1419   BILIRUBINUR NEGATIVE 05/01/2017 1419   KETONESUR NEGATIVE 05/01/2017 1419   PROTEINUR NEGATIVE 05/01/2017 1419   UROBILINOGEN 0.2 02/19/2013 1252   NITRITE NEGATIVE 05/01/2017 1419   LEUKOCYTESUR SMALL (A) 05/01/2017 1419    ----------------------------------------------------------------------------------------------------------------   Imaging Results:    Dg Chest Port 1 View  Result Date: 04/25/2017 CLINICAL DATA:  Shortness of breath. EXAM:  PORTABLE CHEST 1 VIEW COMPARISON:  05/03/2017 FINDINGS: Status post CABG surgery. Cardiac silhouette is mildly enlarged. No mediastinal or hilar masses. Previously seen vascular congestion and interstitial thickening has improved. Opacity at the left lung base has also improved. Residual opacity obscures hemidiaphragm likely due to combination of pleural fluid with atelectasis. No new lung abnormalities.  No pneumothorax. Skeletal structures are grossly intact. IMPRESSION: 1. Improved congestive heart failure. 2. Persistent small left pleural effusion with associated atelectasis. Electronically Signed   By: Dedra Skeens.D.  On: 04/23/2017 12:49    Ekg, will not pull up   Assessment & Plan:    Principal Problem:   Altered mental status Active Problems:   Hyperkalemia   Type II diabetes mellitus with complication, uncontrolled (HCC)      AMS MRI brain Check b12, folate, esr, tsh, ana rpr  Hyperkalemia Kayexalate15gm po x1 Check cmp in am  CAD s/p CABG, CHF (EF 60-65%), mild MR Cont LIsinopril 11m po qday Cont metoprolol 274m1/2 po bid Cont Furosemide 4062mv x1 Please reassess clinically in AM  DM2 Cont Amaryl 1mg32m qday fsbs ac and qhs, ISS  Dementia ? monitor  H/o Pubic rami fracture 05/01/2017 Cont Tramadol DC oxycodone to see if mental status will improved PT to evaluate Social work for SNF placement  Thyroid mass Needs thyroid ultrasound Please discuss with family if they wish to pursue    DVT Prophylaxis   Lovenox - SCDs   AM Labs Ordered, also please review Full Orders  Family Communication: Admission, patients condition and plan of care including tests being ordered have been discussed with the patien  who indicate understanding and agree with the plan and Code Status.  Code Status DNR (yellow form , w patient)  Likely DC to  SNF  Condition GUARDED    Consults called: none  Admission status:   inpatient  Time spent in  minutes : 45   JameJani Gravel on 04/20/2017 at 6:14 PM  Between 7am to 7pm - Pager - [phone] 7pm go to [email or web address] - password [email or web address] - Office  [phone]

## 2017-05-09 NOTE — ED Provider Notes (Signed)
[facility] EMERGENCY DEPARTMENT Provider Note   CSN: [id number] Arrival date & time: 04/28/2017  1200   Level 5 caveat dementia, not answering questions  History   Chief Complaint Chief Complaint  Patient presents with  . Shortness of Breath    HPI Yvonne Butler is a 84 y.o. female.  HPI Patient presents to the emergency room for persistent breathing difficulties after recently having been in the hospital.  Patient was admitted to the hospital on February 16 after an unwitnessed fall.  Patient was diagnosed with an inferior pubic ramus fracture.  Patient resided in a memory care unit but was not in a nursing facility.  She was admitted to the hospital for pain management and nursing care.  Patient was discharged from the hospital on February 9.  Patient was evaluated by physical therapy and they recommended a skilled nursing facility placement.  According to the medical records the family wanted the patient to go back to her memory care facility.  Patient's family state that there were no beds available for skilled nursing facility and that is why they had her go back to the memory care unit.  According to the family the patient has been declining since leaving the hospital.  Prior to being admitted she was able to speak and communicate.  She has not been able to do that since being admitted to the hospital previously.  Patient does have advanced dementia.  She has fallen again since being discharged from the hospital.  No new injuries.  Family noticed that she continues to have difficulty with her breathing.  A doctor who makes house calls saw the patient and told the family she needs to be in hospice and in a skilled nursing facility. She has finished her antibiotics.  She also has some red eye drainage and no body seen her for this at her living facility and this concerns her family. Past Medical History:  Diagnosis Date  . CAD (coronary artery disease)    with CABG- Dr.  Katrinka Blazing  . Complication of anesthesia   . Coronary atherosclerosis of native coronary artery   . Diabetes (HCC)   . Diverticulosis   . Falls frequently 05/01/2017  . Hypercholesteremia   . Hyperlipidemia   . Osteopenia   . PONV (postoperative nausea and vomiting)   . Vitamin D deficiency     Patient Active Problem List   Diagnosis Date Noted  . Hypoxia 05/04/2017  . Falls frequently 05/01/2017  . Pubic ramus fracture (HCC) 05/01/2017  . Chronic anticoagulation 02/05/2016  . Atrial fibrillation (HCC) 03/27/2015  . Other bilateral secondary osteoarthritis of knee 02/21/2013  . Femoral neck fracture (HCC) 02/19/2013  . Leukocytosis 02/19/2013  . Memory loss 02/19/2013  . Fracture of femoral neck, right (HCC) 02/19/2013  . Coronary atherosclerosis of autologous vein bypass graft 01/03/2013    Class: Diagnosis of  . HLD (hyperlipidemia) 01/03/2013    Class: Chronic  . DM ketoacidosis type II, uncontrolled (HCC) 01/03/2013    Class: Chronic    Past Surgical History:  Procedure Laterality Date  . APPENDECTOMY    . CORONARY ARTERY BYPASS GRAFT      in with LIMA to LAD, SVG to the circumflex, SVG to the diagonal, and SVG to the PDA.  [name] HIP ARTHROPLASTY Right 02/19/2013   Procedure: HEMI ARTHROPLASTY BIPOLAR HIP CEMENTED VS UNCEMENTED;  Surgeon: Harvie Junior, MD;  Location: [facility] OR;  Service: Orthopedics;  Laterality: Right;  . HIP SURGERY Right 02/20/2013  HEMI -BIPOLAR ARTHROPLASTY  . Ovarian Cyst & Fallopian Tubes      OB History    No data available       Home Medications    Prior to Admission medications   Medication Sig Start Date End Date Taking? Authorizing Provider  amoxicillin-clavulanate (AUGMENTIN) 875-125 MG tablet Take 1 tablet by mouth 2 (two) times daily for 5 days. 05/04/17 04/21/2017  Glade Lloyd, MD  bisacodyl (DULCOLAX) 10 MG suppository Place 1 suppository (10 mg total) rectally daily as needed for moderate constipation. 05/04/17   Glade Lloyd, MD    busPIRone (BUSPAR) 10 MG tablet Take 10 mg by mouth 2 (two) times daily. 11/22/16   [provider]  camphor-menthol Wynelle Fanny) lotion Apply 1 application topically 3 (three) times daily.    [provider]  furosemide (LASIX) 20 MG tablet Take 2 tablets (40 mg total) by mouth daily. 05/04/17   Glade Lloyd, MD  glimepiride (AMARYL) 1 MG tablet Take 1 mg by mouth See admin instructions. Taking one tablet with breakfast and 1 tablet with lunch daily 04/23/17   [provider]  guaiFENesin (ROBITUSSIN) 100 MG/5ML SOLN Take 10 mLs by mouth every 6 (six) hours as needed for cough or to loosen phlegm.    [provider]  hydrocortisone 2.5 % cream Apply 1 application topically daily as needed for itching. 03/23/17   [provider]  hydrOXYzine (ATARAX/VISTARIL) 10 MG tablet Take 10 mg by mouth 3 (three) times daily. 11/20/16   [provider]  lisinopril (PRINIVIL,ZESTRIL) 5 MG tablet Take 5 mg by mouth daily. 04/23/17   [provider]  metoprolol tartrate (LOPRESSOR) 50 MG tablet Take 1 tablet (50 mg total) by mouth 2 (two) times daily. 05/04/17   Glade Lloyd, MD  Multiple Vitamin (MULTIVITAMIN) tablet Take 1 tablet by mouth daily.    [provider]  Multiple Vitamins-Minerals (PRESERVISION AREDS 2) CAPS Take 1 capsule by mouth 2 (two) times daily.    [provider]  oxyCODONE (OXY IR/ROXICODONE) 5 MG immediate release tablet Take 1-2 tablets (5-10 mg total) by mouth every 6 (six) hours as needed for moderate pain. 05/04/17   Glade Lloyd, MD  senna-docusate (SENOKOT-S) 8.6-50 MG tablet Take 1 tablet by mouth 2 (two) times daily. 05/04/17   Glade Lloyd, MD  traMADol-acetaminophen (ULTRACET) 37.5-325 MG tablet Take 1 tablet by mouth 2 (two) times daily. 05/04/17   Glade Lloyd, MD    Family History Family History  Problem Relation Age of Onset  . Heart disease Father     Social History Social History   Tobacco Use   . Smoking status: Former Smoker    Types: Cigarettes  . Smokeless tobacco: Never Used  Substance Use Topics  . Alcohol use: No  . Drug use: No     Allergies   Patient has no known allergies.   Review of Systems Review of Systems  Unable to perform ROS: Patient nonverbal  Gastrointestinal: Negative for diarrhea and vomiting.     Physical Exam Updated Vital Signs BP (!) 147/65   Pulse 81   Temp (!) 97.5 F (36.4 C) (Oral)   Resp (!) 21   SpO2 98%   Physical Exam  Constitutional: She appears listless. She appears ill. No distress.  Listless  HENT:  Head: Normocephalic and atraumatic.  Right Ear: External ear normal.  Left Ear: External ear normal.  Eyes: Conjunctivae are normal. Right eye exhibits no discharge. Left eye exhibits no discharge. No scleral icterus.  Conjunctival injection of the left eye, small amount of yellowish drainage  Neck: Neck supple. No tracheal deviation present.  Cardiovascular: Normal rate, regular rhythm and intact distal pulses.  Pulmonary/Chest: Effort normal. No stridor. No respiratory distress. She has no wheezes. She has rales.  Abdominal: Soft. Bowel sounds are normal. She exhibits no distension. There is no tenderness. There is no rebound and no guarding.  Musculoskeletal: She exhibits no edema or tenderness.       Right lower leg: She exhibits no edema.  Neurological: She appears listless. No cranial nerve deficit (no facial droop, she does not answer questions or communicate). She exhibits normal muscle tone. She displays no seizure activity.  Patient is not following commands, unable to get her to lift her arms or legs when asked  Skin: Skin is warm and dry. No rash noted.  Psychiatric: She has a normal mood and affect.  Nursing note and vitals reviewed.    ED Treatments / Results  Labs (all labs ordered are listed, but only abnormal results are displayed) Labs Reviewed  BASIC METABOLIC PANEL - Abnormal; Notable for the  following components:      Result Value   Potassium 5.2 (*)    Glucose, Bld 137 (*)    BUN 22 (*)    Calcium 8.7 (*)    All other components within normal limits  CBC - Abnormal; Notable for the following components:   Hemoglobin 15.1 (*)    All other components within normal limits  BRAIN NATRIURETIC PEPTIDE - Abnormal; Notable for the following components:   B Natriuretic Peptide 285.4 (*)    All other components within normal limits  I-STAT TROPONIN, ED    EKG  EKG Interpretation  Date/Time:  Sunday May 09 2017 12:11:27 EST Ventricular Rate:  78 PR Interval:    QRS Duration: 84 QT Interval:  437 QTC Calculation: 498 R Axis:   96 Text Interpretation:  ? Right and left arm electrode reversal,  Atrial fibrillation Right axis deviation , new since last tracing Nonspecific T abnormalities, lateral leads Borderline prolonged QT interval Confirmed by Linwood DibblesKnapp, Kendale Rembold [phone]([id number]) on 05/01/2017 12:16:27 PM       Radiology Dg Chest Port 1 View  Result Date: 05/01/2017 CLINICAL DATA:  Shortness of breath. EXAM: PORTABLE CHEST 1 VIEW COMPARISON:  05/03/2017 FINDINGS: Status post CABG surgery. Cardiac silhouette is mildly enlarged. No mediastinal or hilar masses. Previously seen vascular congestion and interstitial thickening has improved. Opacity at the left lung base has also improved. Residual opacity obscures hemidiaphragm likely due to combination of pleural fluid with atelectasis. No new lung abnormalities.  No pneumothorax. Skeletal structures are grossly intact. IMPRESSION: 1. Improved congestive heart failure. 2. Persistent small left pleural effusion with associated atelectasis. Electronically Signed   By: Amie Portlandavid  Ormond [name]   On: 05/08/2017 12:49    Procedures Procedures (including critical care time)  Medications Ordered in ED Medications  erythromycin ophthalmic ointment 1 application (1 application Left Eye Given 04/21/2017 1424)     Initial Impression / Assessment and Plan /  ED Course  I have reviewed the triage vital signs and the nursing notes.  Pertinent labs & imaging results that were available during my care of the patient were reviewed by me and considered in my medical decision making (see chart for details).  Clinical Course as of May 10 1719  Sun May 09, 2017  1422 ED work up notable for mild dehydration and renal insufficiency.  CXR wtihtou pna or failure.  Vitals are stable.  Overall, pt is listless and weak.  Frequently falling at the nursing facility.  Will consult with case management and social work.  [JK]  1451 Pt was discharged on 2 L [place] after recent hospitalization.  Oxygen remains in the mid to high 90s on 2 l [employer] oxygen.  [JK]  1631 Appreciate hospice/palliative consult.  Pt is not a candidate for inpatient hospice admission.  Will see if SNF is a possibility  [JK]    Clinical Course User Index [JK] Linwood Dibbles, MD    ED workup is reassuring.  No sig changes since her recent hospitalization.  No worsening pna.  No signs of worsening CHF.  Mild electrolyte abnormalities but I doubt this is clinically significant.  Pt is unable to go back to her assisted living facility.  I will contact case manager and social work to see if there is a way to get her into a skilled nursing facility as recommended by her doctor.  Pt has been seen by case management.  It will be difficult to get her into a nursing facility this evening.  Pt has been falling and has generalized weakness.  Will consult with medical service to see if we can bring her in for weakness, frequent falls.  Pt had a CT scan previous visit.  Will order an MRI to assess for an occult stroke.  Final Clinical Impressions(s) / ED Diagnoses   Final diagnoses:  Hyperkalemia  Dehydration  Thyroid mass  Conjunctivitis of left eye, unspecified conjunctivitis type  Frequent falls      Linwood Dibbles, MD 04/26/2017 1723

## 2017-05-09 NOTE — Care Management (Signed)
ED CM consulted concerning possible hospice services. CM spoke EDP Dr. Tomi Bamberger concerning patient and family requesting hospice services. CM met with patient and family at bedside, daughter  Tye [place] [phone] spokesperson for family reports that patient was a patient at [facility] until today when family signed her out, after she was transferred to [facility] ED for a fall. Apparently patient was seen in the ED last week for a fall. ED CM discussed Hospice and Palliative service with patient and family present. Offered choice [facility] selected contacted Marine scientist. She will come to the ED to  meet with patient and family concerning Hospice services. Updated Dr. Tomi Bamberger and Ryland RN.

## 2017-05-09 NOTE — ED Notes (Signed)
Patient transported to MRI 

## 2017-05-09 NOTE — Care Management (Signed)
CM spoke with French Anaracy [facility] for [facility], patient is not a candidate for [facility] at this time.  Family is requesting to speak with ED CSW, .  Family states that patient was receiving HH [facility], and states, they  were not able to provide the services patient needed so they had the patient brought here. CM updated EDP. CSW consult was placed.

## 2017-05-09 NOTE — ED Notes (Signed)
Family given 2 passes to visit, and family (daughter ) stated We are all going back no matter what, I can't leave my father out here, he has dementia, and he has to go with us. Neither son or daughter would stay out in the lobby with him. Called Ryland to inform her,

## 2017-05-10 ENCOUNTER — Encounter (HOSPITAL_COMMUNITY): Payer: Self-pay | Admitting: Internal Medicine

## 2017-05-10 ENCOUNTER — Inpatient Hospital Stay (HOSPITAL_COMMUNITY): Payer: Medicare Other

## 2017-05-10 DIAGNOSIS — S329XXS Fracture of unspecified parts of lumbosacral spine and pelvis, sequela: Secondary | ICD-10-CM | POA: Diagnosis not present

## 2017-05-10 DIAGNOSIS — E860 Dehydration: Secondary | ICD-10-CM

## 2017-05-10 DIAGNOSIS — R4182 Altered mental status, unspecified: Secondary | ICD-10-CM | POA: Diagnosis not present

## 2017-05-10 DIAGNOSIS — Z7189 Other specified counseling: Secondary | ICD-10-CM

## 2017-05-10 DIAGNOSIS — F0281 Dementia in other diseases classified elsewhere with behavioral disturbance: Secondary | ICD-10-CM

## 2017-05-10 DIAGNOSIS — Z515 Encounter for palliative care: Secondary | ICD-10-CM | POA: Diagnosis not present

## 2017-05-10 DIAGNOSIS — H109 Unspecified conjunctivitis: Secondary | ICD-10-CM | POA: Diagnosis not present

## 2017-05-10 DIAGNOSIS — J690 Pneumonitis due to inhalation of food and vomit: Secondary | ICD-10-CM | POA: Diagnosis not present

## 2017-05-10 LAB — COMPREHENSIVE METABOLIC PANEL
ALK PHOS: 147 U/L — AB (ref 38–126)
ALT: 50 U/L (ref 14–54)
ANION GAP: 11 (ref 5–15)
AST: 42 U/L — ABNORMAL HIGH (ref 15–41)
Albumin: 2.7 g/dL — ABNORMAL LOW (ref 3.5–5.0)
BUN: 15 mg/dL (ref 6–20)
CALCIUM: 8.8 mg/dL — AB (ref 8.9–10.3)
CO2: 26 mmol/L (ref 22–32)
Chloride: 101 mmol/L (ref 101–111)
Creatinine, Ser: 0.71 mg/dL (ref 0.44–1.00)
GFR calc non Af Amer: >60 mL/min (ref >60)
Glucose, Bld: 131 mg/dL — ABNORMAL HIGH (ref 65–99)
Potassium: 3.6 mmol/L (ref 3.5–5.1)
SODIUM: 138 mmol/L (ref 135–145)
Total Bilirubin: 1.3 mg/dL — ABNORMAL HIGH (ref 0.3–1.2)
Total Protein: 5.7 g/dL — ABNORMAL LOW (ref 6.5–8.1)

## 2017-05-10 LAB — RPR: RPR: NONREACTIVE

## 2017-05-10 LAB — CBC
HCT: 47.5 % — ABNORMAL HIGH (ref 36.0–46.0)
HEMOGLOBIN: 16 g/dL — AB (ref 12.0–15.0)
MCH: 33.1 pg (ref 26.0–34.0)
MCHC: 33.7 g/dL (ref 30.0–36.0)
MCV: 98.1 fL (ref 78.0–100.0)
Platelets: 337 10*3/uL (ref 150–400)
RBC: 4.84 MIL/uL (ref 3.87–5.11)
RDW: 14.8 % (ref 11.5–15.5)
WBC: 10.8 10*3/uL — ABNORMAL HIGH (ref 4.0–10.5)

## 2017-05-10 LAB — TSH: TSH: 1.392 u[IU]/mL (ref 0.350–4.500)

## 2017-05-10 LAB — SEDIMENTATION RATE: SED RATE: 25 mm/h — AB (ref 0–22)

## 2017-05-10 LAB — VITAMIN B12: Vitamin B-12: 725 pg/mL (ref 180–914)

## 2017-05-10 MED ORDER — ONDANSETRON 4 MG PO TBDP: 4.0000 mg | ORAL_TABLET | Freq: Four times a day (QID) | ORAL | Status: DC | PRN

## 2017-05-10 MED ORDER — MORPHINE SULFATE (CONCENTRATE) 10 MG/0.5ML PO SOLN
10.0000 mg | ORAL | Status: DC | PRN
  Administered 2017-05-12: 10 mg via SUBLINGUAL
  Filled 2017-05-10: qty 0.5

## 2017-05-10 MED ORDER — BUSPIRONE HCL 10 MG PO TABS
10.0000 mg | ORAL_TABLET | Freq: Two times a day (BID) | ORAL | Status: DC
  Filled 2017-05-10: qty 1

## 2017-05-10 MED ORDER — METOPROLOL TARTRATE 50 MG PO TABS: 50.0000 mg | ORAL_TABLET | Freq: Two times a day (BID) | ORAL | Status: DC

## 2017-05-10 MED ORDER — POLYVINYL ALCOHOL 1.4 % OP SOLN
1.0000 [drp] | Freq: Four times a day (QID) | OPHTHALMIC | Status: DC | PRN
  Filled 2017-05-10: qty 15

## 2017-05-10 MED ORDER — ACETAMINOPHEN 650 MG RE SUPP: 650.0000 mg | Freq: Four times a day (QID) | RECTAL | Status: DC | PRN

## 2017-05-10 MED ORDER — PROSIGHT PO TABS: 1.0000 | ORAL_TABLET | Freq: Two times a day (BID) | ORAL | Status: DC

## 2017-05-10 MED ORDER — GLIMEPIRIDE 1 MG PO TABS
1.0000 mg | ORAL_TABLET | Freq: Two times a day (BID) | ORAL | Status: DC
  Filled 2017-05-10: qty 1

## 2017-05-10 MED ORDER — MORPHINE SULFATE (CONCENTRATE) 10 MG/0.5ML PO SOLN
5.0000 mg | Freq: Four times a day (QID) | ORAL | Status: DC
  Administered 2017-05-10 (×2): 5 mg via ORAL
  Filled 2017-05-10 (×2): qty 0.5

## 2017-05-10 MED ORDER — ONDANSETRON HCL 4 MG/2ML IJ SOLN: 4.0000 mg | Freq: Four times a day (QID) | INTRAMUSCULAR | Status: DC | PRN

## 2017-05-10 MED ORDER — SCOPOLAMINE 1 MG/3DAYS TD PT72
1.0000 | MEDICATED_PATCH | TRANSDERMAL | Status: DC
  Administered 2017-05-10: 1.5 mg via TRANSDERMAL
  Filled 2017-05-10: qty 1

## 2017-05-10 MED ORDER — ENOXAPARIN SODIUM 40 MG/0.4ML ~~LOC~~ SOLN: 40.0000 mg | SUBCUTANEOUS | Status: DC

## 2017-05-10 MED ORDER — SODIUM CHLORIDE 0.9 % IV SOLN: 250.0000 mL | INTRAVENOUS | Status: DC | PRN

## 2017-05-10 MED ORDER — BIOTENE DRY MOUTH MT LIQD
15.0000 mL | OROMUCOSAL | Status: DC | PRN
Start: 2017-05-10 — End: 2017-05-12

## 2017-05-10 MED ORDER — SODIUM CHLORIDE 0.9% FLUSH: 3.0000 mL | Freq: Two times a day (BID) | INTRAVENOUS | Status: DC

## 2017-05-10 MED ORDER — TRAMADOL-ACETAMINOPHEN 37.5-325 MG PO TABS
1.0000 | ORAL_TABLET | Freq: Two times a day (BID) | ORAL | Status: DC
  Administered 2017-05-10: 1 via ORAL
  Filled 2017-05-10: qty 1

## 2017-05-10 MED ORDER — ACETAMINOPHEN 325 MG PO TABS: 650.0000 mg | ORAL_TABLET | Freq: Four times a day (QID) | ORAL | Status: DC | PRN

## 2017-05-10 MED ORDER — SODIUM CHLORIDE 0.9% FLUSH: 3.0000 mL | INTRAVENOUS | Status: DC | PRN

## 2017-05-10 MED ORDER — SENNOSIDES-DOCUSATE SODIUM 8.6-50 MG PO TABS: 1.0000 | ORAL_TABLET | Freq: Two times a day (BID) | ORAL | Status: DC

## 2017-05-10 MED ORDER — LISINOPRIL 5 MG PO TABS: 5.0000 mg | ORAL_TABLET | Freq: Every day | ORAL | Status: DC

## 2017-05-10 MED ORDER — ADULT MULTIVITAMIN W/MINERALS CH: 1.0000 | ORAL_TABLET | Freq: Every day | ORAL | Status: DC

## 2017-05-10 MED ORDER — FUROSEMIDE 10 MG/ML IJ SOLN: 40.0000 mg | Freq: Every day | INTRAMUSCULAR | Status: DC

## 2017-05-10 MED ORDER — BISACODYL 10 MG RE SUPP: 10.0000 mg | Freq: Every day | RECTAL | Status: DC | PRN

## 2017-05-10 NOTE — Progress Notes (Signed)
PROGRESS NOTE  Yvonne Butler UJW:[id number] DOB: 29-Jan-1934 DOA: 05-Dec-2017 PCP: Patient, No Pcp Per   LOS: 1 day   Brief Narrative / Interim history: Yvonne CageBarbara Butler  is a 84 y.o. female, w dementia, CAD bypass grafting in 2000 (including a LIMA to LAD, SVG to diagonal, SVG to OM, SVG to PDA), Chronic Afib, Dm2, Hypertension, Hyperlipidemia, recent fall with pubic rami fracture was brought to the hospital with altered mental status. [facility]  Assessment & Plan: Principal Problem:   Altered mental status Active Problems:   Hyperkalemia   Type II diabetes mellitus with complication, uncontrolled (HCC)  Goals of care -discussed with patient's daughter at bedside, patient has advance dementia with poor life quality. Family wishes all care geared towards comfort at this point without additional invasive testing, labs, medications, antibiotics. They wish to speak with palliative care as well. She has almost no po intake and may qualify to residential hospice, will discuss with palliative as well.   Acute encephalopathy Hyperkalemia CAD s/p CABG Diastolic CHF HTN DM2 Recent pubic rami fx Thyroid mass   Code Status: DNR Family Communication: daughter at bedside Disposition Plan: SNF vs residential hospice  Consultants:   Palliative care  Procedures:   None   Antimicrobials:  None    Subjective: -demented, no complaints, smiling. Appears comfortable.   Objective: Vitals:   10-11-17 2229 05/10/17 0037 05/10/17 0508 05/10/17 0830  BP: (!) 155/87 (!) 151/58 (!) 151/66 (!) 155/82  Pulse: 82 82 95 88  Resp: 20 18 17 17   Temp:  (!) 97.3 F (36.3 C) (!) 97.5 F (36.4 C)   TempSrc:  Oral Oral   SpO2: 96% 95% 95% 96%  Weight:      Height:        Intake/Output Summary (Last 24 hours) at 05/10/2017 1225 Last data filed at 05/10/2017 0600 Gross per 24 hour  Intake 0 ml  Output 126 ml  Net -126 ml   Filed Weights   10-11-17 1300    Weight: 61.2 kg (135 lb)    Examination:  Constitutional: NAD Eyes:  lids and conjunctivae normal Neck: normal, supple Respiratory: clear to auscultation bilaterally, no wheezing, no crackles.  Cardiovascular: Regular rate and rhythm, no murmurs / rubs / gallops.  Abdomen: no tenderness. Bowel sounds positive.  Neurologic: moves all 4    Data Reviewed: I have independently reviewed following labs and imaging studies   CBC: Recent Labs  Lab 05/04/17 0315 10-11-17 1248 05/10/17 0512  WBC 14.9* 9.1 10.8*  NEUTROABS 11.5*  --   --   HGB 14.9 15.1* 16.0*  HCT 44.1 45.0 47.5*  MCV 97.6 97.6 98.1  PLT 205 309 337   Basic Metabolic Panel: Recent Labs  Lab 05/04/17 0315 10-11-17 1248 05/10/17 0512  NA 139 136 138  K 3.4* 5.2* 3.6  CL 100* 101 101  CO2 24 24 26   GLUCOSE 119* 137* 131*  BUN 17 22* 15  CREATININE 0.84 0.84 0.71  CALCIUM 9.3 8.7* 8.8*  MG 1.9  --   --    GFR: Estimated Creatinine Clearance: 47.5 mL/min (by C-G formula based on SCr of 0.71 mg/dL). Liver Function Tests: Recent Labs  Lab 05/10/17 0512  AST 42*  ALT 50  ALKPHOS 147*  BILITOT 1.3*  PROT 5.7*  ALBUMIN 2.7*   No results for input(s): LIPASE, AMYLASE in the last 168 hours. No results for input(s): AMMONIA in the last 168 hours. Coagulation Profile: No results  for input(s): INR, PROTIME in the last 168 hours. Cardiac Enzymes: No results for input(s): CKTOTAL, CKMB, CKMBINDEX, TROPONINI in the last 168 hours. BNP (last 3 results) No results for input(s): PROBNP in the last 8760 hours. HbA1C: No results for input(s): HGBA1C in the last 72 hours. CBG: Recent Labs  Lab 05/03/17 1632 05/04/17 0828 05/04/17 1222 05/04/17 1709 05-14-2017 2214  GLUCAP 108* 125* 146* 116* 88   Lipid Profile: No results for input(s): CHOL, HDL, LDLCALC, TRIG, CHOLHDL, LDLDIRECT in the last 72 hours. Thyroid Function Tests: Recent Labs    05/10/17 0512  TSH 1.392   Anemia Panel: Recent Labs     05/10/17 0512  VITAMINB12 725   Urine analysis:    Component Value Date/Time   COLORURINE YELLOW 05/01/2017 1419   APPEARANCEUR CLEAR 05/01/2017 1419   LABSPEC 1.015 05/01/2017 1419   PHURINE 7.0 05/01/2017 1419   GLUCOSEU NEGATIVE 05/01/2017 1419   HGBUR NEGATIVE 05/01/2017 1419   BILIRUBINUR NEGATIVE 05/01/2017 1419   KETONESUR NEGATIVE 05/01/2017 1419   PROTEINUR NEGATIVE 05/01/2017 1419   UROBILINOGEN 0.2 02/19/2013 1252   NITRITE NEGATIVE 05/01/2017 1419   LEUKOCYTESUR SMALL (A) 05/01/2017 1419   Sepsis Labs: Invalid input(s): PROCALCITONIN, LACTICIDVEN  Recent Results (from the past 240 hour(s))  MRSA PCR Screening     Status: None   Collection Time: 05/01/17  6:00 PM  Result Value Ref Range Status   MRSA by PCR NEGATIVE NEGATIVE Final    Comment:        The GeneXpert MRSA Assay (FDA approved for NASAL specimens only), is one component of a comprehensive MRSA colonization surveillance program. It is not intended to diagnose MRSA infection nor to guide or monitor treatment for MRSA infections. Performed at [facility], [address], [place], [place] [address]   Culture, blood (routine x 2)     Status: None   Collection Time: 05/03/17 12:17 AM  Result Value Ref Range Status   Specimen Description BLOOD LEFT HAND  Final   Special Requests IN PEDIATRIC BOTTLE Blood Culture adequate volume  Final   Culture   Final    NO GROWTH 5 DAYS Performed at [facility], [address] [place], [place] [address]    Report Status 05/08/2017 FINAL  Final  Culture, blood (routine x 2)     Status: None   Collection Time: 05/03/17 12:24 AM  Result Value Ref Range Status   Specimen Description BLOOD RIGHT ARM  Final   Special Requests   Final    BOTTLES DRAWN AEROBIC ONLY Blood Culture adequate volume   Culture   Final    NO GROWTH 5 DAYS Performed at [facility], [address], [place], [place] [address]    Report Status 05/08/2017 FINAL  Final        Radiology Studies: Mr Brain Wo Contrast  Result Date: 2017-05-14 CLINICAL DATA:  Multiple recent falls, aphasic for 1 week. Recent hospitalization. History of dementia, diabetes, hypercholesterolemia, atrial fibrillation. EXAM: MRI HEAD WITHOUT CONTRAST TECHNIQUE: Multiplanar, multiecho pulse sequences of the brain and surrounding structures were obtained without intravenous contrast. COMPARISON:  CT HEAD May 01, 2016 and MRI of the head 8 November 08, 2013 FINDINGS: Examination was terminated as patient could not tolerate further imaging. Nondiagnostic hemosiderin sensitive sequence, axial T2 FLAIR. Axial T1 sequence not obtained. INTRACRANIAL CONTENTS: No reduced diffusion to suggest acute ischemia, hypercellular tumor or infection. Advanced temporal occipital lobe atrophy. No hydrocephalus. Old LEFT cerebellar infarct.  Patchy to confluent supratentorial T2 hyperintensities. No midline shift, mass effect or masses though motion degrades sensitivity. No abnormal extra-axial fluid collections. VASCULAR: Normal major intracranial vascular flow voids present at skull base. SKULL AND UPPER CERVICAL SPINE: No abnormal sellar expansion. No suspicious calvarial bone marrow signal. Craniocervical junction maintained. SINUSES/ORBITS: The mastoid air-cells and included paranasal sinuses are well-aerated.The included ocular globes and orbital contents are non-suspicious. Status post bilateral ocular lens implants. Stable LEFT frontal sebaceous cyst. OTHER: None. IMPRESSION: 1. Motion degraded examination, patient could not complete examination. 2. No acute intracranial process. 3. Disproportionate temporoparietal atrophy associated with neurodegenerative syndromes. 4. Moderate chronic small vessel ischemic disease and old LEFT cerebellar infarct. Electronically Signed   By: Awilda Metro [name]   On: 05/01/2017 23:46   Dg Chest Port 1 View  Result Date: 05/06/2017 CLINICAL DATA:  Shortness of breath. EXAM:  PORTABLE CHEST 1 VIEW COMPARISON:  05/03/2017 FINDINGS: Status post CABG surgery. Cardiac silhouette is mildly enlarged. No mediastinal or hilar masses. Previously seen vascular congestion and interstitial thickening has improved. Opacity at the left lung base has also improved. Residual opacity obscures hemidiaphragm likely due to combination of pleural fluid with atelectasis. No new lung abnormalities.  No pneumothorax. Skeletal structures are grossly intact. IMPRESSION: 1. Improved congestive heart failure. 2. Persistent small left pleural effusion with associated atelectasis. Electronically Signed   By: Amie Portland [name]   On: 05/08/2017 12:49     Scheduled Meds: . erythromycin  1 application Left Eye Q6H  . traMADol-acetaminophen  1 tablet Oral BID   Continuous Infusions:     Time spent: 25 minutes    Pamella Pert, MD, PhD [facility] Pager [phone]  If 7PM-7AM, please contact night-coverage [email or web address] Password [facility] 05/10/2017, 12:25 PM

## 2017-05-10 NOTE — Progress Notes (Signed)
Palliative Medicine RN Note: Dr Phillips OdorGolding has seen Mrs Yvonne Butler and started prn and scheduled medications for symptoms. Dr Phillips OdorGolding is putting in a note regarding prognosis, and she recommends inpatient/residential hospice. I have called the referral in to the [employer] rep on call today, Carley Hammedva, and notified unit SW. I also spoke to both children, AmerisourceBergen CorporationCathy & Yvonne Butler.  Margret ChanceMelanie G. Devika Dragovich, RN, BSN, [facility] Palliative Medicine Team 05/10/2017 4:33 PM Office [phone]

## 2017-05-10 NOTE — Progress Notes (Signed)
Nutrition Brief Note  Chart reviewed. Per RN family would like to transition to comfort care only and will address with MD this morning.  No nutrition interventions warranted at this time.  Please consult as needed.   Kendell BaneHeather Mizraim Harmening RD, LDN, CNSC [phone] Pager [phone] After Hours Pager

## 2017-05-10 NOTE — Progress Notes (Signed)
PT Cancellation Note  Patient Details Name: Yvonne GreavesBarbara S Butler MRN: [id number] DOB: 12-Aug-1933   Cancelled Treatment:    Reason Eval/Treat Not Completed: Other (comment) Spoke to RN regarding patient status; RN confirms that patient is now on full comfort care and reports that as far as she is aware, clinical staff had wanted PT order discontinued. PT signing off for now due to patient status/request for PT to be discontinued per RN.   Please reorder PT if situation/patient status changes.    Nedra HaiKristen Cyndia Degraff PT, DPT, CBIS  Supplemental Physical Therapist [facility]   Pager [phone]

## 2017-05-10 NOTE — Evaluation (Signed)
Physical Therapy Evaluation Patient Details Name: SIAH KANNAN MRN: [id number] DOB: 08/07/1933 Today's Date: 05/10/2017   History of Present Illness  84yo  female presenting to ED with AMS. Note recent pubic rami fracture. Admitted for AMS, pain from pubic fracture, CHF. PMH CAD with hx CABG, DM, hx of frequent falls, osteopenia, hip arthroplasty 2014  Clinical Impression   Patient received in bed, sleeping but easily woken and oriented to self only; attempted to re-orient and educate this morning, patient unable to retain this information however. Performed functional rolling with total assist this morning, patient demonstrating painful facial expressions with rolling and appeared to begin to become agitated with further attempts at mobility, confabulating in irritated manner at therapist. Unable to attempt further mobility today due to combination of increasing agitation as well as need for +2 for safe progression of mobility. She was left in bed with all needs met, bed alarm activated this morning. Moving forward she will strongly benefit from SNF placement to further address functional impairments and optimize care.     Follow Up Recommendations SNF;Supervision/Assistance - 24 hour    Equipment Recommendations  None recommended by PT(defer to next venue )    Recommendations for Other Services       Precautions / Restrictions Precautions Precautions: Fall Restrictions Weight Bearing Restrictions: No(pubic ramis fracture 22 week old )      Mobility  Bed Mobility Overal bed mobility: Needs Assistance Bed Mobility: Rolling Rolling: Total assist         General bed mobility comments: attempted rolling in bed, patient appears to have pain during mobility and began to become agitated with PT with unclear speech and confabulation; unable to further assess functional bed mobilty due to need for +2 assist and agitation increasing the more PT attempted mobility today   Transfers                 General transfer comment: DNT   Ambulation/Gait             General Gait Details: DNT   Stairs            Wheelchair Mobility    Modified Rankin (Stroke Patients Only)       Balance Overall balance assessment: Needs assistance Sitting-balance support: Feet supported   Sitting balance - Comments: static sitting EOB    Standing balance support: Bilateral upper extremity supported Standing balance-Leahy Scale: Poor Standing balance comment: reliant on UE support/external support                              Pertinent Vitals/Pain Pain Assessment: Faces Faces Pain Scale: Hurts even more Pain Location: presumed pelvis  Pain Descriptors / Indicators: Grimacing;Guarding;Sharp Pain Intervention(s): Limited activity within patient's tolerance;Monitored during session    Home Living Family/patient expects to be discharged to:: [place]: [place] [facility] - 2 wheels Additional Comments: family not present to provide PLOF/equipment/history; all information taken from prior charting     Prior Function Level of Independence: Needs assistance   Gait / Transfers Assistance Needed: supervision with use of RW (until fall)  ADL's / Homemaking Assistance Needed: required assist for bathing/dressing and went to dining hall for meals  Comments: family not present to confirm- taken from prior charting      Hand Dominance        Extremity/Trunk Assessment  Lower Extremity Assessment Lower Extremity Assessment: Generalized weakness    Cervical / Trunk Assessment Cervical / Trunk Assessment: Kyphotic  Communication   Communication: No difficulties  Cognition Arousal/Alertness: Lethargic Behavior During Therapy: Agitated Overall Cognitive Status: History of cognitive impairments - at baseline                                 General Comments: advanced dementia at baseline;  became agitated with PT when mobility was attempted       General Comments      Exercises     Assessment/Plan    PT Assessment Patient needs continued PT services  PT Problem List Decreased strength;Decreased activity tolerance;Decreased balance;Decreased mobility;Decreased coordination;Decreased cognition;Decreased knowledge of use of DME;Decreased safety awareness;Pain;Decreased knowledge of precautions       PT Treatment Interventions DME instruction;Gait training;Stair training;Functional mobility training;Therapeutic activities;Therapeutic exercise;Balance training;Patient/family education;Neuromuscular re-education    PT Goals (Current goals can be found in the Care Plan section)  Acute Rehab PT Goals PT Goal Formulation: Patient unable to participate in goal setting    Frequency Min 2X/week(pending participation and family's wishes )   Barriers to discharge        Co-evaluation               AM-PAC PT "6 Clicks" Daily Activity  Outcome Measure Difficulty turning over in bed (including adjusting bedclothes, sheets and blankets)?: Unable Difficulty moving from lying on back to sitting on the side of the bed? : Unable Difficulty sitting down on and standing up from a chair with arms (e.g., wheelchair, bedside commode, etc,.)?: Unable Help needed moving to and from a bed to chair (including a wheelchair)?: Total Help needed walking in hospital room?: Total Help needed climbing 3-5 steps with a railing? : Total 6 Click Score: 6    End of Session   Activity Tolerance: Patient limited by pain Patient left: in bed;with call bell/phone within reach;with bed alarm set   PT Visit Diagnosis: Difficulty in walking, not elsewhere classified (R26.2);Muscle weakness (generalized) (M62.81)    Time: 9747-1855 PT Time Calculation (min) (ACUTE ONLY): 10 min   Charges:   PT Evaluation $PT Eval Moderate Complexity: 1 Mod     PT G Codes:        Deniece Ree PT, DPT,  CBIS  Supplemental Physical Therapist [place]   Pager [phone]

## 2017-05-10 NOTE — Consult Note (Signed)
Consultation Note Date: 05/10/2017   Patient Name: Yvonne Butler  DOB: 10-05-1933  MRN: [id number]  Age / Sex: 84 y.o., female  PCP: Patient, No Pcp Per Referring Physician: Leatha Gilding, MD  Reason for Consultation: Disposition, Hospice Evaluation and Terminal Care  HPI/Patient Profile: 84 y.o. female  with past medical history of advanced dementia and recent fall with pelvic fracture admitted on 04/19/2017 with agitation, confusion, aspiration PNA.   Clinical Assessment and Goals of Care: Patient has been admitted to medical floor, she is congested, having rhonchi, slightly agitated, minimally responsive.No PO intake.   Goals have clearly been stated for full comfort care and no additional treatment other than end of life/terminal care.     SUMMARY OF RECOMMENDATIONS    Code Status/Advance Care Planning:  DNR    Symptom Management:   Scheduled and PRN roxanol  Palliative Prophylaxis  Scop patch for secretions  Tylenol for fever  Palliative Prophylaxis:   Aspiration, Delirium Protocol, Frequent Pain Assessment, Oral Care and Turn Reposition  Additional Recommendations (Limitations, Scope, Preferences):  Full Comfort Care  Psycho-social/Spiritual:   Desire for further Chaplaincy support:no  Additional Recommendations: Caregiving  Support/Resources  Prognosis:   < 2 weeks  Discharge Planning: Hospice facility      Primary Diagnoses: Present on Admission: . Altered mental status . Hyperkalemia . Type II diabetes mellitus with complication, uncontrolled (HCC)   I have reviewed the medical record, interviewed the patient and family, and examined the patient. The following aspects are pertinent.  Past Medical History:  Diagnosis Date  . CAD (coronary artery disease)    with CABG- Dr. Katrinka Blazing  . Complication of anesthesia   . Coronary atherosclerosis of native  coronary artery   . Diabetes (HCC)   . Diverticulosis   . Falls frequently 05/01/2017  . Hypercholesteremia   . Hyperlipidemia   . Osteopenia   . PONV (postoperative nausea and vomiting)   . Vitamin D deficiency    Social History   Socioeconomic History  . Marital status: Married    Spouse name: None  . Number of children: None  . Years of education: None  . Highest education level: None  Social Needs  . Financial resource strain: None  . Food insecurity - worry: None  . Food insecurity - inability: None  . Transportation needs - medical: None  . Transportation needs - non-medical: None  Occupational History  . None  Tobacco Use  . Smoking status: Former Smoker    Types: Cigarettes  . Smokeless tobacco: Never Used  Substance and Sexual Activity  . Alcohol use: No  . Drug use: No  . Sexual activity: No  Other Topics Concern  . None  Social History Narrative  . None   Family History  Problem Relation Age of Onset  . Heart disease Father    Scheduled Meds: . erythromycin  1 application Left Eye Q6H  . morphine CONCENTRATE  5 mg Oral QID  . scopolamine  1 patch Transdermal Q72H   Continuous Infusions:  PRN Meds:.acetaminophen **OR** acetaminophen, antiseptic oral rinse, bisacodyl, morphine CONCENTRATE, ondansetron **OR** ondansetron (ZOFRAN) IV, polyvinyl alcohol Medications Prior to Admission:  Prior to Admission medications   Medication Sig Start Date End Date Taking? Authorizing Provider  ALPRAZolam (XANAX) 0.25 MG tablet Take 0.25 mg by mouth 3 (three) times daily as needed for anxiety.  05/05/17  Yes [provider]  amoxicillin (AMOXIL) 500 MG tablet Take 2,000 mg by mouth See admin instructions. Take 4 tablets (2000 mg) by mouth one hour before dental appointments   Yes [provider]  bisacodyl (DULCOLAX) 10 MG suppository Place 1 suppository (10 mg total) rectally daily as needed for moderate constipation. 05/04/17  Yes Glade Lloyd, MD    busPIRone (BUSPAR) 10 MG tablet Take 10 mg by mouth 2 (two) times daily. 11/22/16  Yes [provider]  camphor-menthol Wynelle Fanny) lotion Apply 1 application topically 3 (three) times daily.   Yes [provider]  furosemide (LASIX) 20 MG tablet Take 2 tablets (40 mg total) by mouth daily. 05/04/17  Yes Glade Lloyd, MD  glimepiride (AMARYL) 1 MG tablet Take 1 mg by mouth See admin instructions. Taking one tablet (1 mg) twice daily - with breakfast and lunch 04/23/17  Yes [provider]  guaiFENesin (ROBITUSSIN) 100 MG/5ML SOLN Take 10 mLs by mouth every 6 (six) hours as needed for cough or to loosen phlegm.   Yes [provider]  hydrocortisone 2.5 % cream Apply 1 application topically daily as needed for itching. 03/23/17  Yes [provider]  hydrOXYzine (ATARAX/VISTARIL) 10 MG tablet Take 10 mg by mouth 3 (three) times daily. 11/20/16  Yes [provider]  lisinopril (PRINIVIL,ZESTRIL) 5 MG tablet Take 5 mg by mouth daily. 04/23/17  Yes [provider]  Melatonin 3 MG TABS Take 3 mg by mouth at bedtime.   Yes [provider]  metoprolol tartrate (LOPRESSOR) 50 MG tablet Take 1 tablet (50 mg total) by mouth 2 (two) times daily. 05/04/17  Yes Glade Lloyd, MD  Multiple Vitamin (MULTIVITAMIN WITH MINERALS) TABS tablet Take 1 tablet by mouth daily. Flintstones Complete Gummy   Yes [provider]  Multiple Vitamins-Minerals (PRESERVISION AREDS 2) CAPS Take 1 capsule by mouth 2 (two) times daily.   Yes [provider]  oxyCODONE (OXY IR/ROXICODONE) 5 MG immediate release tablet Take 1-2 tablets (5-10 mg total) by mouth every 6 (six) hours as needed for moderate pain. Patient taking differently: Take 5 mg by mouth every 6 (six) hours as needed for moderate pain.  05/04/17  Yes Glade Lloyd, MD  senna-docusate (SENOKOT-S) 8.6-50 MG tablet Take 1 tablet by mouth 2 (two) times daily. 05/04/17  Yes Glade Lloyd, MD   traMADol-acetaminophen (ULTRACET) 37.5-325 MG tablet Take 1 tablet by mouth 2 (two) times daily. 05/04/17  Yes Glade Lloyd, MD   No Known Allergies Review of Systems  Unable to perform ROS   Physical Exam  Vital Signs: BP (!) 155/82 (BP Location: Right Arm)   Pulse 88   Temp (!) 97.5 F (36.4 C) (Oral)   Resp 17   Ht 5\' 2"  (1.575 m)   Wt 61.2 kg (135 lb)   SpO2 96%   BMI 24.69 kg/m  Pain Assessment: No/denies pain   Pain Score: 0-No pain   SpO2: SpO2: 96 % O2 Device:SpO2: 96 % O2 Flow Rate: .O2 Flow Rate (L/min): 2 L/min  IO: Intake/output summary:   Intake/Output Summary (Last 24 hours) at 05/10/2017 1621 Last data filed at 05/10/2017 1340  Gross per 24 hour  Intake 240 ml  Output 126 ml  Net 114 ml    LBM: Last BM Date: 05/10/17 Baseline Weight: Weight: 61.2 kg (135 lb) Most recent weight: Weight: 61.2 kg (135 lb)     Palliative Assessment/Data:   Flowsheet Rows     Most Recent Value  Intake Tab  Referral Department  Hospitalist  Unit at Time of Referral  Med/Surg Unit  Palliative Care Primary Diagnosis  Neurology  Date Notified  05/10/17  Palliative Care Type  New Palliative care  Reason for referral  Counsel Regarding Hospice [Family wants inpt hospice, need help w appropriateness]  Date of Admission  05/06/2017  Date first seen by Palliative Care  05/10/17  # of days Palliative referral response time  0 Day(s)  # of days IP prior to Palliative referral  1  Clinical Assessment  Psychosocial & Spiritual Assessment  Palliative Care Outcomes  Patient/Family meeting held?  Yes  Who was at the meeting?  Daughter and son, both by phone  Palliative Care Outcomes  Clarified goals of care, Counseled regarding hospice      Time In: 3:40 Time Out:4:15 Time Total: 35 min Greater than 50%  of this time was spent counseling and coordinating care related to the above assessment and plan.  Signed by: Anderson MaltaElizabeth Anjel Pardo, DO   Please contact Palliative  Medicine Team phone at [phone] for questions and concerns.  For individual provider: See Loretha StaplerAmion

## 2017-05-10 NOTE — Progress Notes (Signed)
New Admission Note:   Arrival Method:   Via stretcher from ED Mental Orientation:  A & O x 1 Telemetry:   Tele #[id number] Assessment: Completed Skin:  See Assessment IV:  Rt FA NSL Pain:  None  Tubes:  None Safety Measures: Safety Fall Prevention Plan has been given, discussed and signed Admission: Completed  Family:  None at Bedside  Patient's daughter and granddaughter came around 0600.  Granddaughter is a Optician, dispensing[employer] Nurse Practitioner.  Lab results, Radiology results, and physicians notes reviewed with family.  Questions and concerns addressed.  Family is adamant that they want to transition patient to comfort care.  They will address with AM primary physician.  Patient is alert to self only.  She is unable to reorient and is unable to follow commands.  She pulls telemetry leads off, purwick off, and gown off.  This is even with the green safety mitts on patient.  Orders have been reviewed and implemented. Will continue to monitor the patient. Call light has been placed within reach and bed alarm has been activated.   Bernie CoveyKimberly Luc Shammas RN- BC, [facility] Phone number: [phone]

## 2017-05-10 NOTE — Progress Notes (Signed)
Palliative Medicine RN Note: Came to see patient and family. Husband Chrissie NoaWilliam is at bedside. Patient talks but is almost completely unintelligible. She has a wet cough. Husband reports that she did not get a tray this am, but he also struggles to follow conversations, misunderstanding when I ask about his wife and answering about his daughter.   Husband states that daughter Lynden AngCathy is primary Management consultantdecision maker, but RN reports that it is son Dorene SorrowJerry. Granddaughter is NP with PCCM. I spoke with both Dorene SorrowJerry and Lloydathy; they agree that they want only comfort measures and want nothing aggressive or life-prolonging. Goal is completely comfort. They wish to allow her to not eat and to die peacefully, as they recognize that not eating will bring EOL.   Patient has been refusing to eat at facility. Albumin is 2.7. She is unable to cooperate with therapies and treatments due to confusion and dementia, and she pulls at lines/O2.   Mrs [name] was admitted 2/16-2/19 due to pubic ramus fx after a fall and did not have surgical intervention. During that admission, she had leukocytosis and received empiric IV abx. She also has a documented dx of recent pna. Her hx includes frequent falls, CAD, afib (not on coagulation d/t frequent falls), dementia, CHF, bypass graft, DM2, HTN, diverticulosis, hypercholesterolemia, HLD, osteopenia, vitamin D deficiency. She has also been having likely aspiration with audible secretions and a wet cough with, per family, no appetite. [facility] has been "force feeding" her, and family does not want this done any longer. Respiratory status has declined this month, and she started needing O2 about a week ago. BNP is elevated.  She presented to the ED yesterday with persistent SOB, hyperkalemia, and AMS after being found on the floor without O2 on. EDP notes renal insufficiency, dehydration, listless, weak, conjuncivitis. Family is refusing further tx for electrolyte derangements. Pubic ramus fx has not  healed.  Discussed patient with Dr Phillips OdorGolding. She will come later today to address prognosis, which I suspect is short due to not eating or drinking.   Margret ChanceMelanie G. Paw Karstens, RN, BSN, [facility] Palliative Medicine Team 05/10/2017 12:33 PM Office [phone]

## 2017-05-10 NOTE — Progress Notes (Signed)
Noted PT discontinuation order; PT signing off at this time. Please reorder if situation changes or family requests skilled PT services to continue.  Nedra HaiKristen Unger PT, DPT, CBIS  Supplemental Physical Therapist [facility]   Pager [phone]

## 2017-05-11 DIAGNOSIS — Z515 Encounter for palliative care: Secondary | ICD-10-CM | POA: Diagnosis not present

## 2017-05-11 LAB — FOLATE RBC
FOLATE, HEMOLYSATE: 545.6 ng/mL
Folate, RBC: 1186 ng/mL (ref >498)
HEMATOCRIT: 46 % (ref 34.0–46.6)

## 2017-05-11 MED ORDER — MORPHINE SULFATE (CONCENTRATE) 10 MG/0.5ML PO SOLN: 10.0000 mg | ORAL | 0 refills | Status: AC | PRN

## 2017-05-11 MED ORDER — ATROPINE SULFATE 1 % OP SOLN
2.0000 [drp] | Freq: Three times a day (TID) | OPHTHALMIC | Status: DC | PRN
  Administered 2017-05-12 (×2): 2 [drp] via SUBLINGUAL
  Filled 2017-05-11 (×2): qty 2

## 2017-05-11 MED ORDER — GLYCOPYRROLATE 1 MG PO TABS
1.0000 mg | ORAL_TABLET | Freq: Three times a day (TID) | ORAL | Status: DC
  Filled 2017-05-11 (×3): qty 1

## 2017-05-11 MED ORDER — SCOPOLAMINE 1 MG/3DAYS TD PT72: 1.0000 | MEDICATED_PATCH | TRANSDERMAL | 12 refills | Status: AC

## 2017-05-11 MED ORDER — MORPHINE SULFATE (CONCENTRATE) 10 MG/0.5ML PO SOLN
10.0000 mg | ORAL | Status: DC
Start: 2017-05-11 — End: 2017-05-12
  Administered 2017-05-11 – 2017-05-12 (×6): 10 mg via ORAL
  Filled 2017-05-11 (×5): qty 0.5

## 2017-05-11 MED ORDER — MORPHINE SULFATE (CONCENTRATE) 10 MG/0.5ML PO SOLN
10.0000 mg | Freq: Four times a day (QID) | ORAL | Status: DC
  Filled 2017-05-11: qty 0.5

## 2017-05-11 MED ORDER — MORPHINE SULFATE (PF) 4 MG/ML IV SOLN: 3.0000 mg | Freq: Once | INTRAVENOUS | Status: DC

## 2017-05-11 NOTE — Discharge Summary (Signed)
Physician Discharge Summary  Yvonne Butler ZOX:[id number] DOB: 1934/03/06 DOA: 05/01/2017  PCP: Patient, No Pcp Per  Admit date: 05/02/2017 Discharge date: 05/11/2017  Admitted From: SNF Disposition:  Residential hospice  Recommendations for Outpatient Follow-up:  1. Discharge to residential hospice  Discharge Condition: guarded CODE STATUS: DNR Diet recommendation: comfort feeding  HPI: Per Dr. Selena Batten, Yvonne Butler  is a 84 y.o. female, w CAD bypass grafting in 2000 (including a LIMA to LAD, SVG to diagonal, SVG to OM, SVG to PDA), Chronic Afib, Dm2, Hypertension, Hyperlipidemia, recent fall with pubic rami fracture has apparently had some altered mental status.   Hospital Course: Goals of care -discussed with patient's daughter at bedside, patient has advance dementia with poor life quality. Family wishes all care geared towards comfort at this point without additional invasive testing, labs, medications, antibiotics.  Palliative care was consulted as well.  Given no p.o. intake, she is appropriate for residential hospice.  Will discharge when bed is available Acute encephalopathy Hyperkalemia CAD s/p CABG Diastolic CHF HTN DM2 Recent pubic rami fx Thyroid mass   Discharge Diagnoses:  Principal Problem:   Altered mental status Active Problems:   Hyperkalemia   Type II diabetes mellitus with complication, uncontrolled (HCC)     Discharge Instructions   Allergies as of 05/11/2017   No Known Allergies     Medication List    STOP taking these medications   ALPRAZolam 0.25 MG tablet Commonly known as:  XANAX   amoxicillin 500 MG tablet Commonly known as:  AMOXIL   amoxicillin-clavulanate 875-125 MG tablet Commonly known as:  AUGMENTIN   busPIRone 10 MG tablet Commonly known as:  BUSPAR   camphor-menthol lotion Commonly known as:  SARNA   furosemide 20 MG tablet Commonly known as:  LASIX   glimepiride 1 MG tablet Commonly known as:  AMARYL     guaiFENesin 100 MG/5ML Soln Commonly known as:  ROBITUSSIN   hydrocortisone 2.5 % cream   hydrOXYzine 10 MG tablet Commonly known as:  ATARAX/VISTARIL   lisinopril 5 MG tablet Commonly known as:  PRINIVIL,ZESTRIL   Melatonin 3 MG Tabs   metoprolol tartrate 50 MG tablet Commonly known as:  LOPRESSOR   multivitamin with minerals Tabs tablet   oxyCODONE 5 MG immediate release tablet Commonly known as:  Oxy IR/ROXICODONE   PRESERVISION AREDS 2 Caps   traMADol-acetaminophen 37.5-325 MG tablet Commonly known as:  ULTRACET     TAKE these medications   bisacodyl 10 MG suppository Commonly known as:  DULCOLAX Place 1 suppository (10 mg total) rectally daily as needed for moderate constipation.   morphine CONCENTRATE 10 MG/0.5ML Soln concentrated solution Place 0.5 mLs (10 mg total) under the tongue every 2 (two) hours as needed for moderate pain, severe pain or shortness of breath (breakthrough pain).   scopolamine 1 MG/3DAYS Commonly known as:  TRANSDERM-SCOP Place 1 patch (1.5 mg total) onto the skin every 3 (three) days. Start taking on:  05/13/2017   senna-docusate 8.6-50 MG tablet Commonly known as:  Senokot-S Take 1 tablet by mouth 2 (two) times daily.       Consultations:  Palliative care   Procedures/Studies:  Dg Chest 2 View  Result Date: 05/03/2017 CLINICAL DATA:  Shortness of Breath EXAM: CHEST  2 VIEW COMPARISON:  May 01, 2017 FINDINGS: There is patchy airspace consolidation in the lung bases with left pleural effusion. There is mild underlying interstitial pulmonary edema. There is cardiomegaly with pulmonary venous hypertension. Patient is status post coronary  artery bypass grafting. No adenopathy. There is aortic atherosclerosis. There is calcification in the mitral annulus. No bone lesions. IMPRESSION: Underlying changes suggesting congestive heart failure. Left pleural effusion with bibasilar consolidation. Suspect bibasilar pneumonia, although the  changes in the lung bases could be due to alveolar edema. Both alveolar edema and pneumonia may present concurrently. Patient is status post coronary artery bypass grafting. There is aortic atherosclerosis. Aortic Atherosclerosis (ICD10-I70.0). Electronically Signed   By: Bretta Bang III [name]   On: 05/03/2017 10:46   Dg Chest 2 View  Result Date: 05/01/2017 CLINICAL DATA:  Fall, pain, initial encounter. EXAM: CHEST  2 VIEW COMPARISON:  06/03/2015. FINDINGS: Trachea is deviated slightly to the left, which may be due to right thyroid enlargement. Heart is enlarged. Thoracic aorta is calcified. Biapical pleural scarring. Lungs are somewhat low in volume with mild atelectasis at the left lung base. Tiny pleural effusion on the lateral view is difficult to localize on the frontal view. Visualized osseous structures appear grossly intact. IMPRESSION: 1. Left basilar atelectasis. 2. Tiny pleural effusion, difficult to localize on the frontal view. Electronically Signed   By: Leanna Battles [name]   On: 05/01/2017 09:06   Ct Head Wo Contrast  Result Date: 05/01/2017 CLINICAL DATA:  Found on the floor. EXAM: CT HEAD WITHOUT CONTRAST CT CERVICAL SPINE WITHOUT CONTRAST TECHNIQUE: Multidetector CT imaging of the head and cervical spine was performed following the standard protocol without intravenous contrast. Multiplanar CT image reconstructions of the cervical spine were also generated. COMPARISON:  CT head and cervical spine dated January 07, 2016. FINDINGS: CT HEAD FINDINGS Brain: No evidence of acute infarction, hemorrhage, hydrocephalus, extra-axial collection or mass lesion/mass effect. Stable atrophy and chronic microvascular ischemic changes. Vascular: Calcified atherosclerosis at the skullbase. No hyperdense vessel. Skull: Negative for fracture or focal lesion. Sinuses/Orbits: No acute finding. Other: Stable left frontal scalp subcutaneous lesion. CT CERVICAL SPINE FINDINGS Alignment: No traumatic  malalignment. Skull base and vertebrae: No acute fracture. No primary bone lesion or focal pathologic process. Soft tissues and spinal canal: No prevertebral fluid or swelling. No visible canal hematoma. Disc levels: Moderate to severe degenerative disc disease and uncovertebral hypertrophy at C5-C6 and C6-C7, similar to prior study. Moderate to severe right-sided facet arthropathy throughout the cervical spine. Multilevel mild to moderate neuroforaminal stenosis. Upper chest: Mild centrilobular emphysema. Other: Slight interval enlargement of the right thyroid mass, now measuring 3.8 cm, previously 3.5 cm. IMPRESSION: 1. No acute intracranial abnormality. Stable atrophy and chronic microvascular ischemic changes. 2. No acute cervical spine fracture. Stable multilevel degenerative changes. 3. Slight interval enlargement of the right thyroid mass, now measuring 3.8 cm, previously 3.5 cm. Recommend thyroid ultrasound and possible tissue sampling if not previously performed. Electronically Signed   By: Obie Dredge [name]   On: 05/01/2017 09:33   Ct Cervical Spine Wo Contrast  Result Date: 05/01/2017 CLINICAL DATA:  Found on the floor. EXAM: CT HEAD WITHOUT CONTRAST CT CERVICAL SPINE WITHOUT CONTRAST TECHNIQUE: Multidetector CT imaging of the head and cervical spine was performed following the standard protocol without intravenous contrast. Multiplanar CT image reconstructions of the cervical spine were also generated. COMPARISON:  CT head and cervical spine dated January 07, 2016. FINDINGS: CT HEAD FINDINGS Brain: No evidence of acute infarction, hemorrhage, hydrocephalus, extra-axial collection or mass lesion/mass effect. Stable atrophy and chronic microvascular ischemic changes. Vascular: Calcified atherosclerosis at the skullbase. No hyperdense vessel. Skull: Negative for fracture or focal lesion. Sinuses/Orbits: No acute finding. Other: Stable left frontal scalp  subcutaneous lesion. CT CERVICAL SPINE FINDINGS  Alignment: No traumatic malalignment. Skull base and vertebrae: No acute fracture. No primary bone lesion or focal pathologic process. Soft tissues and spinal canal: No prevertebral fluid or swelling. No visible canal hematoma. Disc levels: Moderate to severe degenerative disc disease and uncovertebral hypertrophy at C5-C6 and C6-C7, similar to prior study. Moderate to severe right-sided facet arthropathy throughout the cervical spine. Multilevel mild to moderate neuroforaminal stenosis. Upper chest: Mild centrilobular emphysema. Other: Slight interval enlargement of the right thyroid mass, now measuring 3.8 cm, previously 3.5 cm. IMPRESSION: 1. No acute intracranial abnormality. Stable atrophy and chronic microvascular ischemic changes. 2. No acute cervical spine fracture. Stable multilevel degenerative changes. 3. Slight interval enlargement of the right thyroid mass, now measuring 3.8 cm, previously 3.5 cm. Recommend thyroid ultrasound and possible tissue sampling if not previously performed. Electronically Signed   By: Obie DredgeWilliam T Derry [name]   On: 05/01/2017 09:33   Ct Pelvis Wo Contrast  Result Date: 05/01/2017 CLINICAL DATA:  Right hip pain after fall.  Evaluate for fracture. EXAM: CT PELVIS WITHOUT CONTRAST TECHNIQUE: Multidetector CT imaging of the pelvis was performed following the standard protocol without intravenous contrast. COMPARISON:  Bilateral hip x-rays from same day. FINDINGS: Bones/Joint/Cartilage Prior right hip hemiarthroplasty. No hardware failure or loosening. Acute, nondisplaced fracture of the right inferior pubic ramus. No additional fracture seen. Normal alignment. No joint effusion. Mild left hip joint space narrowing. Advanced degenerative changes at L5-S1. Ligaments Ligaments are suboptimally evaluated by CT. Muscles and Tendons Atrophy of the right piriformis muscle. Soft tissue No fluid collection or hematoma.  No soft tissue mass. IMPRESSION: 1. Acute, nondisplaced fracture of the  right inferior pubic ramus. 2. Prior right hip hemiarthroplasty without evidence of hardware complication. Electronically Signed   By: Obie DredgeWilliam T Derry [name]   On: 05/01/2017 11:49   Mr Brain Wo Contrast  Result Date: 11-Nov-2017 CLINICAL DATA:  Multiple recent falls, aphasic for 1 week. Recent hospitalization. History of dementia, diabetes, hypercholesterolemia, atrial fibrillation. EXAM: MRI HEAD WITHOUT CONTRAST TECHNIQUE: Multiplanar, multiecho pulse sequences of the brain and surrounding structures were obtained without intravenous contrast. COMPARISON:  CT HEAD May 01, 2016 and MRI of the head 8 November 08, 2013 FINDINGS: Examination was terminated as patient could not tolerate further imaging. Nondiagnostic hemosiderin sensitive sequence, axial T2 FLAIR. Axial T1 sequence not obtained. INTRACRANIAL CONTENTS: No reduced diffusion to suggest acute ischemia, hypercellular tumor or infection. Advanced temporal occipital lobe atrophy. No hydrocephalus. Old LEFT cerebellar infarct. Patchy to confluent supratentorial T2 hyperintensities. No midline shift, mass effect or masses though motion degrades sensitivity. No abnormal extra-axial fluid collections. VASCULAR: Normal major intracranial vascular flow voids present at skull base. SKULL AND UPPER CERVICAL SPINE: No abnormal sellar expansion. No suspicious calvarial bone marrow signal. Craniocervical junction maintained. SINUSES/ORBITS: The mastoid air-cells and included paranasal sinuses are well-aerated.The included ocular globes and orbital contents are non-suspicious. Status post bilateral ocular lens implants. Stable LEFT frontal sebaceous cyst. OTHER: None. IMPRESSION: 1. Motion degraded examination, patient could not complete examination. 2. No acute intracranial process. 3. Disproportionate temporoparietal atrophy associated with neurodegenerative syndromes. 4. Moderate chronic small vessel ischemic disease and old LEFT cerebellar infarct.  Electronically Signed   By: Awilda Metroourtnay  Bloomer [name]   On: 029-Aug-2019 23:46   Dg Chest Port 1 View  Result Date: 11-Nov-2017 CLINICAL DATA:  Shortness of breath. EXAM: PORTABLE CHEST 1 VIEW COMPARISON:  05/03/2017 FINDINGS: Status post CABG surgery. Cardiac silhouette is mildly enlarged. No mediastinal or hilar masses.  Previously seen vascular congestion and interstitial thickening has improved. Opacity at the left lung base has also improved. Residual opacity obscures hemidiaphragm likely due to combination of pleural fluid with atelectasis. No new lung abnormalities.  No pneumothorax. Skeletal structures are grossly intact. IMPRESSION: 1. Improved congestive heart failure. 2. Persistent small left pleural effusion with associated atelectasis. Electronically Signed   By: Amie Portland [name]   On: 04/22/2017 12:49   Dg Humerus Right  Result Date: 05/01/2017 CLINICAL DATA:  Fall with right arm pain. EXAM: RIGHT HUMERUS - 2+ VIEW COMPARISON:  Right shoulder 08/25/2009 FINDINGS: Mild degenerate change of the [facility] joint. No evidence of fracture or dislocation. IMPRESSION: No acute findings. Electronically Signed   By: Elberta Fortis [name]   On: 05/01/2017 09:05   Dg Hips Bilat W Or Wo Pelvis 3-4 Views  Result Date: 05/01/2017 CLINICAL DATA:  Fall with left hip pain. EXAM: DG HIP (WITH OR WITHOUT PELVIS) 3-4V BILAT COMPARISON:  02/19/2013 FINDINGS: Right hip arthroplasty unchanged. Mild degenerate change of the left hip. No definite left hip fracture or dislocation. Mild focal irregularity over the right inferior pubic ramus which may represent a nondisplaced fracture. Degenerate change of the spine. IMPRESSION: No acute left hip findings. Focal cortical regularity over the right inferior pubic ramus as this may represent a nondisplaced fracture. Consider CT for further evaluation. Right hip arthroplasty unchanged. Electronically Signed   By: Elberta Fortis [name]   On: 05/01/2017 09:10     Subjective: minimally  responsive, appears comfortable   Discharge Exam: Vitals:   05/10/17 0830 05/11/17 0953  BP: (!) 155/82 (!) 144/64  Pulse: 88 (!) 120  Resp: 17 18  Temp:  98 F (36.7 C)  SpO2: 96% 98%    General: comfortable    The results of significant diagnostics from this hospitalization (including imaging, microbiology, ancillary and laboratory) are listed below for reference.     Microbiology: Recent Results (from the past 240 hour(s))  MRSA PCR Screening     Status: None   Collection Time: 05/01/17  6:00 PM  Result Value Ref Range Status   MRSA by PCR NEGATIVE NEGATIVE Final    Comment:        The GeneXpert MRSA Assay (FDA approved for NASAL specimens only), is one component of a comprehensive MRSA colonization surveillance program. It is not intended to diagnose MRSA infection nor to guide or monitor treatment for MRSA infections. Performed at [facility], [address], [place], [place] [address]   Culture, blood (routine x 2)     Status: None   Collection Time: 05/03/17 12:17 AM  Result Value Ref Range Status   Specimen Description BLOOD LEFT HAND  Final   Special Requests IN PEDIATRIC BOTTLE Blood Culture adequate volume  Final   Culture   Final    NO GROWTH 5 DAYS Performed at [facility], [address] [place], [place] [address]    Report Status 05/08/2017 FINAL  Final  Culture, blood (routine x 2)     Status: None   Collection Time: 05/03/17 12:24 AM  Result Value Ref Range Status   Specimen Description BLOOD RIGHT ARM  Final   Special Requests   Final    BOTTLES DRAWN AEROBIC ONLY Blood Culture adequate volume   Culture   Final    NO GROWTH 5 DAYS Performed at [facility], [address] [place], [place] [address]    Report Status 05/08/2017 FINAL  Final  Labs: BNP (last 3 results) Recent Labs    04/24/2017 1248  BNP 285.4*   Basic Metabolic Panel: Recent Labs  Lab 05/10/2017 1248 05/10/17 0512  NA 136 138  K 5.2* 3.6    CL 101 101  CO2 24 26  GLUCOSE 137* 131*  BUN 22* 15  CREATININE 0.84 0.71  CALCIUM 8.7* 8.8*   Liver Function Tests: Recent Labs  Lab 05/10/17 0512  AST 42*  ALT 50  ALKPHOS 147*  BILITOT 1.3*  PROT 5.7*  ALBUMIN 2.7*   No results for input(s): LIPASE, AMYLASE in the last 168 hours. No results for input(s): AMMONIA in the last 168 hours. CBC: Recent Labs  Lab 04/26/2017 1248 05/10/17 0512  WBC 9.1 10.8*  HGB 15.1* 16.0*  HCT 45.0 47.5*  MCV 97.6 98.1  PLT 309 337   Cardiac Enzymes: No results for input(s): CKTOTAL, CKMB, CKMBINDEX, TROPONINI in the last 168 hours. BNP: Invalid input(s): POCBNP CBG: Recent Labs  Lab 05/04/17 1709 04/24/2017 2214  GLUCAP 116* 88   D-Dimer No results for input(s): DDIMER in the last 72 hours. Hgb A1c No results for input(s): HGBA1C in the last 72 hours. Lipid Profile No results for input(s): CHOL, HDL, LDLCALC, TRIG, CHOLHDL, LDLDIRECT in the last 72 hours. Thyroid function studies Recent Labs    05/10/17 0512  TSH 1.392   Anemia work up Recent Labs    05/10/17 0512  VITAMINB12 725   Urinalysis    Component Value Date/Time   COLORURINE YELLOW 05/01/2017 1419   APPEARANCEUR CLEAR 05/01/2017 1419   LABSPEC 1.015 05/01/2017 1419   PHURINE 7.0 05/01/2017 1419   GLUCOSEU NEGATIVE 05/01/2017 1419   HGBUR NEGATIVE 05/01/2017 1419   BILIRUBINUR NEGATIVE 05/01/2017 1419   KETONESUR NEGATIVE 05/01/2017 1419   PROTEINUR NEGATIVE 05/01/2017 1419   UROBILINOGEN 0.2 02/19/2013 1252   NITRITE NEGATIVE 05/01/2017 1419   LEUKOCYTESUR SMALL (A) 05/01/2017 1419   Sepsis Labs Invalid input(s): PROCALCITONIN,  WBC,  LACTICIDVEN   Time coordinating discharge: 20 minutes  SIGNED:  Pamella Pert, MD  [facility] 05/11/2017, 1:44 PM Pager [phone]  If 7PM-7AM, please contact night-coverage [email or web address] Password [email or web address]

## 2017-05-11 NOTE — Progress Notes (Addendum)
Palliative Medicine RN Note: Pt wakes but not as often. She is more relaxed when not being moved but still has significant pain with moving per floor staff.   BP 144/64 HR 120, which indicates ongoing pain. She is also grimacing when sleeping with visible creases on her forehead. Daughter is at bedside and does not feel that she is comfortable. Obtained rx to increase pain medication and to place foley to minimize movement (patient has pubic ramus fx).  I sat with pt's daughter and husband at family request to help explain situation to Mr [name]. He told me yesterday that he understands that if she doesn't eat she will die. He remembered that fact, and I explained that because we aren't going to force feed her anymore, we are going to do everything we can to provide comfort and dignity and that we are going to move her to a facility staffed with nurses specially trained to manage her symptoms. I highlighted the pain she is having with moving, and he verbalized understanding. I don't know how much he will retain, so he will likely need reinforcement, but for now, he seems to understand.   Margret ChanceMelanie G. Chivonne Rascon, RN, BSN, [facility] Palliative Medicine Team 05/11/2017 9:02 AM Office [phone]

## 2017-05-11 NOTE — Progress Notes (Signed)
[facility]  Received VM request from PMT RN Shawna OrleansMelanie for family interest in [facility] yesterday at 4:30 pm. Spoke with CSW Erie NoeVanessa today to acknowledge referral and let Erie NoeVanessa know [facility]. Will continue to follow until disposition is determined. Will update Erie NoeVanessa if availability for this patient changes.  Thank you, Forrestine Himva Davis, LCSW [phone]

## 2017-05-11 NOTE — Progress Notes (Addendum)
Palliative Medicine RN Note: Symptom check. Patient is more comfortable. Husband at bedside reports that patient is going to [place].  I spoke to Dr Hilma Favors. Patient's needs CANNOT be met at a SNF even with hospice, and she requires 24 hour hospice care.   I spoke with POA Sonia Side, who will call his sister then call me back. We discussed going to other inpatient hospices, and he is amenable to that if it will keep his mother comfortable. He returned my call and is agreeable to [facility] first, then [facility] I have a call into Cheri w [facility] to see if they have a bed available today.   [facility] has a bed, and our Chiropractor will fax facesheet, order, and PMT consult note.Their liason Marcene Brawn will be in touch with [facility]; she will have a bed in the morning. I have spoken with Dr Cruzita Lederer, who agrees with the plan.   Marjie Skiff Khang Hannum, RN, BSN, [facility] Palliative Medicine Team 05/11/2017 1:22 PM Office [phone]

## 2017-05-11 NOTE — Clinical Social Work Note (Signed)
CSW talked with patient's husband at the bedside and daughter, Olegario MessierCathy [place] ([phone]) by phone regarding discharge disposition. Family is agreeable to hospice placement and requests [facility], however CSW confirmed with Forrestine HimEva Davis with [facility] that they do not have any beds today. Talked further with family and they are agreeable to another hospice facility. Contact made with [facility] and they can take patient on Wednesday. They are requesting an 11:00 am transport from the hospital to Hospice facility. MD will be advised.  Genelle BalVanessa Jarian Longoria, MSW, LCSW Licensed Clinical Social Worker Clinical Social Work Department [facility] [phone]

## 2017-05-12 DIAGNOSIS — R4182 Altered mental status, unspecified: Secondary | ICD-10-CM | POA: Diagnosis not present

## 2017-05-12 DIAGNOSIS — F0391 Unspecified dementia with behavioral disturbance: Secondary | ICD-10-CM

## 2017-05-12 DIAGNOSIS — Z515 Encounter for palliative care: Secondary | ICD-10-CM

## 2017-05-12 DIAGNOSIS — F03918 Unspecified dementia, unspecified severity, with other behavioral disturbance: Secondary | ICD-10-CM

## 2017-05-12 MED ORDER — GLYCOPYRROLATE 0.2 MG/ML IJ SOLN
0.6000 mg | Freq: Three times a day (TID) | INTRAMUSCULAR | Status: DC
  Administered 2017-05-12: 0.6 mg via INTRAVENOUS
  Filled 2017-05-12 (×2): qty 3

## 2017-05-12 MED ORDER — MORPHINE SULFATE (PF) 4 MG/ML IV SOLN
4.0000 mg | INTRAVENOUS | Status: DC | PRN
  Administered 2017-05-12: 8 mg via INTRAVENOUS
  Administered 2017-05-12: 4 mg via INTRAVENOUS
  Administered 2017-05-12: 8 mg via INTRAVENOUS
  Administered 2017-05-12: 4 mg via INTRAVENOUS

## 2017-05-12 MED ORDER — MORPHINE BOLUS VIA INFUSION
2.0000 mg | INTRAVENOUS | Status: DC | PRN
  Administered 2017-05-12 (×4): 2 mg via INTRAVENOUS
  Administered 2017-05-12: 4 mg via INTRAVENOUS
  Administered 2017-05-12: 2 mg via INTRAVENOUS
  Filled 2017-05-12: qty 4

## 2017-05-12 MED ORDER — MORPHINE 100MG IN NS 100ML (1MG/ML) PREMIX INFUSION: 2.0000 mg/h | INTRAVENOUS | Status: DC

## 2017-05-12 MED ORDER — LORAZEPAM 2 MG/ML IJ SOLN
0.5000 mg | Freq: Two times a day (BID) | INTRAMUSCULAR | Status: DC
  Administered 2017-05-12: 0.5 mg via INTRAVENOUS
  Filled 2017-05-12: qty 1

## 2017-05-12 MED ORDER — LORAZEPAM 2 MG/ML IJ SOLN
1.0000 mg | INTRAMUSCULAR | Status: DC | PRN
  Administered 2017-05-12 (×2): 1 mg via INTRAVENOUS
  Filled 2017-05-12 (×2): qty 1

## 2017-05-12 MED ORDER — SODIUM CHLORIDE 0.9 % IV SOLN
4.0000 mg/h | INTRAVENOUS | Status: DC
  Administered 2017-05-12: 10 mg/h via INTRAVENOUS
  Administered 2017-05-12: 8 mg/h via INTRAVENOUS
  Administered 2017-05-12: 4 mg/h via INTRAVENOUS
  Filled 2017-05-12: qty 10

## 2017-05-12 MED ORDER — MORPHINE BOLUS VIA INFUSION: 4.0000 mg | INTRAVENOUS | Status: DC | PRN

## 2017-05-14 NOTE — Progress Notes (Signed)
Patient comfortable in bed. IV flushed. Atropine given as ordered.  Will await to give report to [facility].

## 2017-05-14 NOTE — Progress Notes (Signed)
PMT RN will be unavailable today. If help is needed from PMT, please contact the office at [phone]  [facility] G. Tell Rozelle, RN, BSN, [facility] Palliative Medicine Team 04/18/2017 7:04 AM Office [phone]

## 2017-05-14 NOTE — Clinical Social Work Note (Signed)
CSW advised this morning by Jeoffrey MassedPA-C Marianne that Mrs. Yvonne Butler is actively dying and will remain in the hospital, as transporting her may be too traumatic. Arrangements had been made for patient to discharge to [facility] facility today and [facility] transport had been set-up for 11 am. Gavin Poundeborah with [facility] informed regarding d/c being cancelled and [facility] transport cancelled. CSW will continue to follow and provide SW intervention services if needed/requested.  Genelle BalVanessa Ayaansh Smail, MSW, LCSW Licensed Clinical Social Worker Clinical Social Work Department [facility] [phone]

## 2017-05-14 NOTE — Progress Notes (Addendum)
Patient with increased jerking. Advance Morphine to 3mg . Ativan 1mg  IV given as ordered. Patient seems more comfortable at this time.  12:25pm Patient with increased shortness of breath. Morphine drip increased to 4mg /hr. Morphine bolus 2mg  given at this time. Atropine gtts given at this time. Will monitor.  1:26pm Patient with agonal breathing. 2mg  Bolus of morphine given at this time. Will monitor.  1:53pm Patient struggling to breathe, 2mg  Bolus of morphine given at this time. Will monitor.  2:12pm Patient gasping for air, and appears to be uncomfortable. Family agrees. 4mg  Bolus of Morphine given at this time. Will continue to monitor.   2:35pm Patient showing some signs of discomfort with intermittent gasping. Family agrees. 2mg  Morphine Bolus given at this time. Will continue to monitor.   3:15pm Patient showing signs of discomfort and agonal breathing.  Morphine drip increased to 8mg /hr. 8mg  Bolus given. Ativan 1mg  IV given. Family agreeable with course.  Will continue to monitor.   3:52pm Patient gasping for air. 4mg  Morphine bolus given at this time.   4:26pm Patients respirations are intermittent and slowing down. Occasional gasps of air. 4mg  bolus Morphine given at this time.   4:56pm Shallow gasps assessed. Morphine drip increased to max dose of 10mg /hr.  8mg  IV bolus of morphine given at this time. Family agrees.  Will continue to monitor.   5:28pm Patient has expired confirmed by 2 RNs.

## 2017-05-14 NOTE — Progress Notes (Signed)
PROGRESS NOTE  Yvonne Butler:[id number] DOB: [birth year] DOA: 04/25/2017 PCP: Patient, No Pcp Per   LOS: 3 days   Brief Narrative / Interim history: Yvonne Butler  is a 84 y.o. female, w dementia, CAD bypass grafting in 2000 (including a LIMA to LAD, SVG to diagonal, SVG to OM, SVG to PDA), Chronic Afib, Dm2, Hypertension, Hyperlipidemia, recent fall with pubic rami fracture was brought to the hospital with altered mental status. She comes from [facility]  Assessment & Plan: Principal Problem:   Altered mental status Active Problems:   Hyperkalemia   Type II diabetes mellitus with complication, uncontrolled (HCC)   Dementia with behavioral disturbance   Terminal care   Palliative care encounter  Goals of care -Dr Elvera Lennox discussed with patient's daughter , patient has advance dementia with poor life quality. -Family wishes all care geared towards comfort at this point without additional invasive testing, labs, medications, antibiotics.  -Plan was to be  transfer patient to residential hospice today, but patient appears more uncomfortable not stable to be transfer. She was started on morphine gtt.   Acute encephalopathy FTT Hyperkalemia CAD s/p CABG Diastolic CHF HTN DM2 Recent pubic rami fx Thyroid mass   Code Status: DNR Family Communication: husband at bedside.  Disposition Plan: expect hospital death.   Consultants:   Palliative care  Procedures:   None   Antimicrobials:  None    Subjective: -non responsive, jerking movement of legs.  - appears in pain. Nurse will give morphine bolus.   Objective: Vitals:   05/10/17 0508 05/10/17 0830 05/11/17 0953 05/03/2017 0456  BP: (!) 151/66 (!) 155/82 (!) 144/64 114/73  Pulse: 95 88 (!) 120 (!) 131  Resp: 17 17 18    Temp: (!) 97.5 F (36.4 C)  98 F (36.7 C) 98.1 F (36.7 C)  TempSrc: Oral  Oral Oral  SpO2: 95% 96% 98%   Weight:      Height:        Intake/Output Summary (Last 24 hours)  at 05/06/2017 1430 Last data filed at 05/11/2017 0600 Gross per 24 hour  Intake 0 ml  Output 325 ml  Net -325 ml   Filed Weights   05/04/2017 1300  Weight: 61.2 kg (135 lb)    Examination:  Constitutional: anxious. In pain. Non verbal.  Respiratory: bilateral ronchus. Mild increase work of breathing.  Cardiovascular: S 1, S 2 RRR Abdomen: BS present, soft, nt Neurologic: non responsive.    Data Reviewed: I have independently reviewed following labs and imaging studies   CBC: Recent Labs  Lab 05/13/2017 1248 05/10/17 0512  WBC 9.1 10.8*  HGB 15.1* 16.0*  HCT 45.0 47.5*  46.0  MCV 97.6 98.1  PLT 309 337   Basic Metabolic Panel: Recent Labs  Lab 05/05/2017 1248 05/10/17 0512  NA 136 138  K 5.2* 3.6  CL 101 101  CO2 24 26  GLUCOSE 137* 131*  BUN 22* 15  CREATININE 0.84 0.71  CALCIUM 8.7* 8.8*   GFR: Estimated Creatinine Clearance: 47.5 mL/min (by C-G formula based on SCr of 0.71 mg/dL). Liver Function Tests: Recent Labs  Lab 05/10/17 0512  AST 42*  ALT 50  ALKPHOS 147*  BILITOT 1.3*  PROT 5.7*  ALBUMIN 2.7*   No results for input(s): LIPASE, AMYLASE in the last 168 hours. No results for input(s): AMMONIA in the last 168 hours. Coagulation Profile: No results for input(s): INR, PROTIME in the last 168 hours. Cardiac Enzymes: No results for input(s): CKTOTAL,  CKMB, CKMBINDEX, TROPONINI in the last 168 hours. BNP (last 3 results) No results for input(s): PROBNP in the last 8760 hours. HbA1C: No results for input(s): HGBA1C in the last 72 hours. CBG: Recent Labs  Lab 05/11/2017 2214  GLUCAP 88   Lipid Profile: No results for input(s): CHOL, HDL, LDLCALC, TRIG, CHOLHDL, LDLDIRECT in the last 72 hours. Thyroid Function Tests: Recent Labs    05/10/17 0512  TSH 1.392   Anemia Panel: Recent Labs    05/10/17 0512  VITAMINB12 725   Urine analysis:    Component Value Date/Time   COLORURINE YELLOW 05/01/2017 1419   APPEARANCEUR CLEAR 05/01/2017  1419   LABSPEC 1.015 05/01/2017 1419   PHURINE 7.0 05/01/2017 1419   GLUCOSEU NEGATIVE 05/01/2017 1419   HGBUR NEGATIVE 05/01/2017 1419   BILIRUBINUR NEGATIVE 05/01/2017 1419   KETONESUR NEGATIVE 05/01/2017 1419   PROTEINUR NEGATIVE 05/01/2017 1419   UROBILINOGEN 0.2 02/19/2013 1252   NITRITE NEGATIVE 05/01/2017 1419   LEUKOCYTESUR SMALL (A) 05/01/2017 1419   Sepsis Labs: Invalid input(s): PROCALCITONIN, LACTICIDVEN  Recent Results (from the past 240 hour(s))  Culture, blood (routine x 2)     Status: None   Collection Time: 05/03/17 12:17 AM  Result Value Ref Range Status   Specimen Description BLOOD LEFT HAND  Final   Special Requests IN PEDIATRIC BOTTLE Blood Culture adequate volume  Final   Culture   Final    NO GROWTH 5 DAYS Performed at [facility], [address] [place], [place] [address]    Report Status 05/08/2017 FINAL  Final  Culture, blood (routine x 2)     Status: None   Collection Time: 05/03/17 12:24 AM  Result Value Ref Range Status   Specimen Description BLOOD RIGHT ARM  Final   Special Requests   Final    BOTTLES DRAWN AEROBIC ONLY Blood Culture adequate volume   Culture   Final    NO GROWTH 5 DAYS Performed at [facility], [address] [place], [place] [address]    Report Status 05/08/2017 FINAL  Final      Radiology Studies: No results found.   Scheduled Meds: . erythromycin  1 application Left Eye Q6H  . glycopyrrolate  1 mg Oral TID  . LORazepam  0.5 mg Intravenous BID  .  morphine injection  3 mg Intravenous Once   Continuous Infusions: . morphine 4 mg/hr (05/06/2017 1003)       Time spent: 25 minutes    Hartley BarefootBelkys Thuy Atilano, MD [facility] Pager [phone]  If 7PM-7AM, please contact night-coverage [email or web address] Password [email or web address] 04/20/2017, 2:30 PM

## 2017-05-14 NOTE — Death Summary Note (Signed)
Death Summary  Yvonne GreavesBarbara S Butler ZOX:[id number] DOB: Jul 18, 1933 DOA: 2017/10/02  PCP: Patient, No Pcp Per PCP/Office notified:  Admit date: 2017/10/02 Date of Death: 04/24/2017  Final Diagnoses:    Advanced dementia.  Acute encephalopathy  Aspiration Pneumonia.   Acute hypoxic Respiratory failure.    Altered mental status   Hyperkalemia   Type II diabetes mellitus with complication, uncontrolled (HCC)   Dementia with behavioral disturbance   Terminal care   Palliative care encounter     History of present illness:  BarbaraSmithis a83 y.o.female,w dementia, CADbypass grafting in 2000 (including a LIMA to LAD, SVG to diagonal, SVG to OM, SVG to PDA),Chronic Afib, Dm2, Hypertension, Hyperlipidemia, recent fall with pubic rami fracture was brought to the hospital with altered mental status. [facility]  Assessment & Plan: Principal Problem:   Altered mental status Active Problems:   Hyperkalemia   Type II diabetes mellitus with complication, uncontrolled (HCC)   Dementia with behavioral disturbance   Terminal care   Palliative care encounter  Goals of care -Dr Elvera LennoxGherghe discussed with patient's daughter , patient has advance dementia with poor life quality. -Family wishes all care geared towards comfort at this point without additional invasive testing, labs, medications, antibiotics.  -Plan was to be  transfer patient to residential hospice today, but patient appears more uncomfortable not stable to be transfer. She was started on morphine gtt. Patient expire at 5;30 PM.   Acute encephalopathy Aspiration Pneumonia, Acute hypoxic respiratory Failure. No antibiotics per family wishes.  FTT Hyperkalemia CAD s/p CABG Diastolic CHF HTN DM2 Recent pubic rami fx Thyroid mass      Time: 5;28 Pm.  Signed:  Ahmar Pickrell A Emmalynne Courtney  [facility] 05/11/2017, 6:08 PM

## 2017-05-14 NOTE — Progress Notes (Signed)
Daily Progress Note   Patient Name: Yvonne Butler       Date: 04/17/2017 DOB: April 12, 1933  Age: 84 y.o. MRN#: [id number] Attending Physician: Alba Cory, MD Primary Care Physician: Patient, No Pcp Per Admit Date: 05-17-2017  Reason for Consultation/Follow-up: Establishing goals of care, Non pain symptom management, Psychosocial/spiritual support and Terminal Care  Subjective: Patient minimally verbal.  Appears uncomfortable. Tachycardic (@131 ).  Daughter and sister at bedside.   Talked in the hallway with daughter.  I believe the patient is actively dying and it would be traumatic to be transported outside of the hospital.  Daughter is surprised that her mother's time is likely shorter than expected.  She agrees with the decision not to transport her outside of the hospital and to increase medications to make her more comfortable.   Assessment:  84 y.o. female  with past medical history of advanced dementia and recent fall with pelvic fracture admitted on May 17, 2017 with agitation, confusion, aspiration PNA.   Now appears to be actively dying.    Length of Stay: 3  Current Medications: Scheduled Meds:  . erythromycin  1 application Left Eye Q6H  . glycopyrrolate  1 mg Oral TID  . LORazepam  0.5 mg Intravenous BID  .  morphine injection  3 mg Intravenous Once    Continuous Infusions: . morphine      PRN Meds: acetaminophen **OR** acetaminophen, antiseptic oral rinse, atropine, bisacodyl, LORazepam, morphine, ondansetron **OR** ondansetron (ZOFRAN) IV, polyvinyl alcohol  Physical Exam       Well developed elderly female lying in bed.  Tremoring, occasional myoclonus. Unintelligible speech. CV tachy resp coarse Abdomen soft.  Vital Signs: BP 114/73 (BP Location: Right  Arm)   Pulse (!) 131 Comment: Charito notified  Temp 98.1 F (36.7 C) (Oral)   Resp 18   Ht 5\' 2"  (1.575 m)   Wt 61.2 kg (135 lb)   SpO2 98%   BMI 24.69 kg/m  SpO2: SpO2: 98 % O2 Device: O2 Device: Room Air O2 Flow Rate: O2 Flow Rate (L/min): 2 L/min  Intake/output summary:   Intake/Output Summary (Last 24 hours) at 05/07/2017 0913 Last data filed at 05/04/2017 0600 Gross per 24 hour  Intake 0 ml  Output 325 ml  Net -325 ml   LBM: Last BM Date: 05/10/17 Baseline Weight: Weight:  61.2 kg (135 lb) Most recent weight: Weight: 61.2 kg (135 lb)       Palliative Assessment/Data: 20%    Flowsheet Rows     Most Recent Value  Intake Tab  Referral Department  Hospitalist  Unit at Time of Referral  Med/Surg Unit  Palliative Care Primary Diagnosis  Neurology  Date Notified  05/10/17  Palliative Care Type  New Palliative care  Reason for referral  Counsel Regarding Hospice [Family wants inpt hospice, need help w appropriateness]  Date of Admission  January 25, 2018  Date first seen by Palliative Care  05/10/17  # of days Palliative referral response time  0 Day(s)  # of days IP prior to Palliative referral  1  Clinical Assessment  Psychosocial & Spiritual Assessment  Palliative Care Outcomes  Patient/Family meeting held?  Yes  Who was at the meeting?  Daughter and son, both by phone  Palliative Care Outcomes  Clarified goals of care, Counseled regarding hospice      Patient Active Problem List   Diagnosis Date Noted  . Altered mental status 04/15/2017  . Hyperkalemia 04/15/2017  . Type II diabetes mellitus with complication, uncontrolled (HCC) 04/15/2017  . Thyroid mass   . Hypoxia 05/04/2017  . Falls frequently 05/01/2017  . Pubic ramus fracture (HCC) 05/01/2017  . Chronic anticoagulation 02/05/2016  . Atrial fibrillation (HCC) 03/27/2015  . Other bilateral secondary osteoarthritis of knee 02/21/2013  . Femoral neck fracture (HCC) 02/19/2013  . Leukocytosis 02/19/2013  .  Memory loss 02/19/2013  . Fracture of femoral neck, right (HCC) 02/19/2013  . Coronary atherosclerosis of autologous vein bypass graft 01/03/2013    Class: Diagnosis of  . HLD (hyperlipidemia) 01/03/2013    Class: Chronic  . DM ketoacidosis type II, uncontrolled (HCC) 01/03/2013    Class: Chronic    Palliative Care Plan    Recommendations/Plan:  Will start morphine drip with prn boluses.  Scheduled ativan bid for myoclonus and comfort.  Continue PRN comfort medications.  Will d/c scop patch as patient is receiving atropine drops and has robinul PRN  D/C order for Hospice House.    Anticipate Hospital death.  Goals of Care and Additional Recommendations:  Limitations on Scope of Treatment: Full Comfort Care  Code Status:  DNR  Prognosis:   Hours - Days   Discharge Planning:  Anticipated Hospital Death  Care plan was discussed with CSW, family, bedside RN.  Thank you for allowing the Palliative Medicine Team to assist in the care of this patient.  Total time spent:  25 min.     Greater than 50%  of this time was spent counseling and coordinating care related to the above assessment and plan.  Norvel RichardsMarianne Samael Blades, PA-C Palliative Medicine  Please contact Palliative MedicineTeam phone at [phone] for questions and concerns between 7 am - 7 pm.   Please see AMION for individual provider pager numbers.

## 2017-05-14 DEATH — deceased

## 2017-08-18 ENCOUNTER — Encounter

## 2017-08-27 ENCOUNTER — Emergency Department: Admit: 2017-08-27 | Payer: MEDICARE | Primary: Internal Medicine

## 2017-08-27 ENCOUNTER — Inpatient Hospital Stay
Admit: 2017-08-27 | Discharge: 2017-08-31 | Disposition: A | Discharge status: Skilled Nursing Facility | Payer: MEDICARE | Attending: Internal Medicine | Admitting: Internal Medicine

## 2017-08-27 DIAGNOSIS — S065X0A Traumatic subdural hemorrhage without loss of consciousness, initial encounter: Principal | ICD-10-CM

## 2017-08-27 LAB — METABOLIC PANEL, COMPREHENSIVE
ALT (SGPT): 15 U/L (ref 12–78)
AST (SGOT): 27 U/L (ref 15–37)
Albumin: 3.4 gm/dl (ref 3.4–5.0)
Alk. phosphatase: 72 U/L (ref 45–117)
Anion gap: 8 mmol/L (ref 5–15)
BUN: 15 mg/dl (ref 7–25)
Bilirubin, total: 0.6 mg/dl (ref 0.2–1.0)
CO2: 27 mEq/L (ref 21–32)
Calcium: 9.2 mg/dl (ref 8.5–10.1)
Chloride: 110 mEq/L — ABNORMAL HIGH (ref 98–107)
Creatinine: 0.7 mg/dl (ref 0.6–1.3)
GFR est AA: >60
GFR est non-AA: >60
Glucose: 144 mg/dl — ABNORMAL HIGH (ref 74–106)
Potassium: 3.2 mEq/L — ABNORMAL LOW (ref 3.5–5.1)
Protein, total: 6.1 gm/dl — ABNORMAL LOW (ref 6.4–8.2)
Sodium: 145 mEq/L (ref 136–145)

## 2017-08-27 LAB — CBC WITH AUTOMATED DIFF
BASOPHILS: 0.4 % (ref 0–3)
EOSINOPHILS: 3.7 % (ref 0–5)
HCT: 31.8 % — ABNORMAL LOW (ref 37.0–50.0)
HGB: 11.3 gm/dl — ABNORMAL LOW (ref 13.0–17.2)
IMMATURE GRANULOCYTES: 0.6 % (ref 0.0–3.0)
LYMPHOCYTES: 23.5 % — ABNORMAL LOW (ref 28–48)
MCH: 34.6 pg (ref 25.4–34.6)
MCHC: 35.5 gm/dl (ref 30.0–36.0)
MCV: 97.2 fL (ref 80.0–98.0)
MONOCYTES: 8 % (ref 1–13)
MPV: 11.5 fL — ABNORMAL HIGH (ref 6.0–10.0)
NEUTROPHILS: 63.8 % (ref 34–64)
NRBC: 0 (ref 0–0)
PLATELET: 128 10*3/uL — ABNORMAL LOW (ref 140–450)
RBC: 3.27 M/uL — ABNORMAL LOW (ref 3.60–5.20)
RDW-SD: 47.3 — ABNORMAL HIGH (ref 36.4–46.3)
WBC: 5.2 10*3/uL (ref 4.0–11.0)

## 2017-08-27 LAB — TSH 3RD GENERATION
TSH: 5.27 u[IU]/mL — ABNORMAL HIGH (ref 0.358–3.740)
TSH: 5.27 u[IU]/mL — ABNORMAL HIGH (ref 0.358–3.740)

## 2017-08-27 LAB — EKG, 12 LEAD, INITIAL
Atrial Rate: 56 {beats}/min
Calculated P Axis: 25 degrees
Calculated R Axis: -28 degrees
Calculated T Axis: -8 degrees
P-R Interval: 234 ms
Q-T Interval: 530 ms
QRS Duration: 90 ms
QTC Calculation (Bezet): 511 ms
Ventricular Rate: 56 {beats}/min

## 2017-08-27 LAB — METABOLIC PANEL, BASIC
Anion gap: 9 mmol/L (ref 5–15)
BUN: 16 mg/dl (ref 7–25)
CO2: 27 mEq/L (ref 21–32)
Calcium: 9.1 mg/dl (ref 8.5–10.1)
Chloride: 109 mEq/L — ABNORMAL HIGH (ref 98–107)
Creatinine: 0.7 mg/dl (ref 0.6–1.3)
GFR est AA: >60
GFR est non-AA: >60
Glucose: 141 mg/dl — ABNORMAL HIGH (ref 74–106)
Potassium: 3.2 mEq/L — ABNORMAL LOW (ref 3.5–5.1)
Sodium: 146 mEq/L — ABNORMAL HIGH (ref 136–145)

## 2017-08-27 LAB — PTT: aPTT: 31.5 seconds (ref 25.1–36.5)

## 2017-08-27 LAB — MAGNESIUM
Magnesium: 1.7 mg/dl (ref 1.6–2.6)
Magnesium: 1.7 mg/dl (ref 1.6–2.6)

## 2017-08-27 LAB — TROPONIN I: Troponin-I: 0.037 ng/ml (ref 0.000–0.045)

## 2017-08-27 LAB — T4, FREE
FREE T4, FT4T: 1.06 ng/dl (ref 0.76–1.46)
Free T4: 1.06 ng/dl (ref 0.76–1.46)

## 2017-08-27 LAB — GLUCOSE, POC: Glucose (POC): 127 mg/dL — ABNORMAL HIGH (ref 65–105)

## 2017-08-27 LAB — CBC WITH AUTO DIFFERENTIAL
Basophils %: 0.4 % (ref 0–3)
Eosinophils %: 3.7 % (ref 0–5)
Hematocrit: 31.8 % — ABNORMAL LOW (ref 37.0–50.0)
Hemoglobin: 11.3 gm/dl — ABNORMAL LOW (ref 13.0–17.2)
Immature Granulocytes: 0.6 % (ref 0.0–3.0)
Lymphocytes %: 23.5 % — ABNORMAL LOW (ref 28–48)
MCH: 34.6 pg (ref 25.4–34.6)
MCHC: 35.5 gm/dl (ref 30.0–36.0)
MCV: 97.2 fL (ref 80.0–98.0)
MPV: 11.5 fL — ABNORMAL HIGH (ref 6.0–10.0)
Monocytes %: 8 % (ref 1–13)
Neutrophils %: 63.8 % (ref 34–64)
Nucleated RBCs: 0 (ref 0–0)
Platelets: 128 10*3/uL — ABNORMAL LOW (ref 140–450)
RBC: 3.27 M/uL — ABNORMAL LOW (ref 3.60–5.20)
RDW-SD: 47.3 — ABNORMAL HIGH (ref 36.4–46.3)
WBC: 5.2 10*3/uL (ref 4.0–11.0)

## 2017-08-27 LAB — COMPREHENSIVE METABOLIC PANEL
ALT: 15 U/L (ref 12–78)
AST: 27 U/L (ref 15–37)
Albumin: 3.4 gm/dl (ref 3.4–5.0)
Alkaline Phosphatase: 72 U/L (ref 45–117)
Anion Gap: 8 mmol/L (ref 5–15)
BUN: 15 mg/dl (ref 7–25)
CO2: 27 mEq/L (ref 21–32)
Calcium: 9.2 mg/dl (ref 8.5–10.1)
Chloride: 110 mEq/L — ABNORMAL HIGH (ref 98–107)
Creatinine: 0.7 mg/dl (ref 0.6–1.3)
EGFR IF NonAfrican American: >60
GFR African American: >60
Glucose: 144 mg/dl — ABNORMAL HIGH (ref 74–106)
Potassium: 3.2 mEq/L — ABNORMAL LOW (ref 3.5–5.1)
Sodium: 145 mEq/L (ref 136–145)
Total Bilirubin: 0.6 mg/dl (ref 0.2–1.0)
Total Protein: 6.1 gm/dl — ABNORMAL LOW (ref 6.4–8.2)

## 2017-08-27 LAB — EKG 12-LEAD
Atrial Rate: 56 {beats}/min
P Axis: 25 degrees
P-R Interval: 234 ms
Q-T Interval: 530 ms
QRS Duration: 90 ms
QTc Calculation (Bazett): 511 ms
R Axis: -28 degrees
T Axis: -8 degrees
Ventricular Rate: 56 {beats}/min

## 2017-08-27 LAB — POCT GLUCOSE: POC Glucose: 127 mg/dL — ABNORMAL HIGH (ref 65–105)

## 2017-08-27 LAB — BASIC METABOLIC PANEL
Anion Gap: 9 mmol/L (ref 5–15)
BUN: 16 mg/dl (ref 7–25)
CO2: 27 mEq/L (ref 21–32)
Calcium: 9.1 mg/dl (ref 8.5–10.1)
Chloride: 109 mEq/L — ABNORMAL HIGH (ref 98–107)
Creatinine: 0.7 mg/dl (ref 0.6–1.3)
EGFR IF NonAfrican American: >60
GFR African American: >60
Glucose: 141 mg/dl — ABNORMAL HIGH (ref 74–106)
Potassium: 3.2 mEq/L — ABNORMAL LOW (ref 3.5–5.1)
Sodium: 146 mEq/L — ABNORMAL HIGH (ref 136–145)

## 2017-08-27 LAB — APTT: aPTT: 31.5 seconds (ref 25.1–36.5)

## 2017-08-27 LAB — TROPONIN: Troponin I: 0.037 ng/ml (ref 0.000–0.045)

## 2017-08-27 MED ORDER — ACETAMINOPHEN 325 MG TABLET
325 mg | Freq: Four times a day (QID) | ORAL | Status: DC | PRN
Start: 2017-08-27 — End: 2017-08-31
  Administered 2017-08-27 – 2017-08-30 (×6): via ORAL

## 2017-08-27 MED ORDER — LEVOTHYROXINE 50 MCG TAB
50 mcg | Freq: Every day | ORAL | Status: DC
Start: 2017-08-27 — End: 2017-08-27

## 2017-08-27 MED ORDER — METOPROLOL TARTRATE 25 MG TAB
25 mg | Freq: Two times a day (BID) | ORAL | Status: DC
Start: 2017-08-27 — End: 2017-08-27

## 2017-08-27 MED ORDER — D5-1/2 NS & POTASSIUM CHLORIDE 20 MEQ/L IV
20 mEq/L | INTRAVENOUS | Status: AC
Start: 2017-08-27 — End: 2017-08-27
  Administered 2017-08-27: 16:00:00 via INTRAVENOUS

## 2017-08-27 MED ORDER — ELECTROLYTE REPLACEMENT PROTOCOL
Status: DC | PRN
Start: 2017-08-27 — End: 2017-08-31

## 2017-08-27 MED ORDER — POTASSIUM CHLORIDE SR 10 MEQ TAB
10 mEq | ORAL | Status: AC
Start: 2017-08-27 — End: 2017-08-27
  Administered 2017-08-27: 21:00:00 via ORAL

## 2017-08-27 MED ORDER — DEXTROMETHORPHAN-GUAIFENESIN 10 MG-100 MG/5 ML SYRUP
100-10 mg/5 mL | Freq: Four times a day (QID) | ORAL | Status: DC | PRN
Start: 2017-08-27 — End: 2017-08-31

## 2017-08-27 MED ORDER — DIPHTH,PERTUS(AC)TETANUS VAC(PF) 2.5 LF UNIT-8 MCG-5 LF/0.5 ML INJ
Freq: Once | INTRAMUSCULAR | Status: AC
Start: 2017-08-27 — End: 2017-08-27
  Administered 2017-08-27: 14:00:00 via INTRAMUSCULAR

## 2017-08-27 MED ORDER — LEVOTHYROXINE 112 MCG TAB
112 mcg | Freq: Every day | ORAL | Status: DC
Start: 2017-08-27 — End: 2017-08-31
  Administered 2017-08-28 – 2017-08-31 (×4): via ORAL

## 2017-08-27 MED ORDER — IPRATROPIUM-ALBUTEROL 2.5 MG-0.5 MG/3 ML NEB SOLUTION
2.5 mg-0.5 mg/3 ml | RESPIRATORY_TRACT | Status: DC | PRN
Start: 2017-08-27 — End: 2017-08-31

## 2017-08-27 MED ORDER — DOCUSATE SODIUM 100 MG CAP
100 mg | Freq: Every day | ORAL | Status: DC
Start: 2017-08-27 — End: 2017-08-31
  Administered 2017-08-28 – 2017-08-31 (×4): via ORAL

## 2017-08-27 MED ORDER — ONDANSETRON (PF) 4 MG/2 ML INJECTION
4 mg/2 mL | Freq: Four times a day (QID) | INTRAMUSCULAR | Status: DC | PRN
Start: 2017-08-27 — End: 2017-08-27

## 2017-08-27 MED ORDER — SODIUM CHLORIDE 0.9% BOLUS IV
0.9 % | INTRAVENOUS | Status: AC
Start: 2017-08-27 — End: 2017-08-27
  Administered 2017-08-27: 14:00:00 via INTRAVENOUS

## 2017-08-27 MED FILL — POTASSIUM CHLORIDE SR 10 MEQ TAB: 10 mEq | ORAL | Qty: 2

## 2017-08-27 MED FILL — ELECTROLYTE REPLACEMENT PROTOCOL: Qty: 1

## 2017-08-27 MED FILL — ACETAMINOPHEN 325 MG TABLET: 325 mg | ORAL | Qty: 2

## 2017-08-27 MED FILL — METOPROLOL TARTRATE 25 MG TAB: 25 mg | ORAL | Qty: 1

## 2017-08-27 MED FILL — BOOSTRIX TDAP 2.5 LF UNIT-8 MCG-5 LF/0.5 ML INTRAMUSCULAR SYRINGE: INTRAMUSCULAR | Qty: 0.5

## 2017-08-27 NOTE — Consults (Signed)
Neurosurgery Consult:  Cc: interhemispheric subdural hematoma, scalp laceration and closed head injury  Hx;  84 year old female admitted through the ed for evaluation and observation  Had been on Xeralto for dvt, discontinuedseveral weeks ago.  Patient got up this morning, became dizzy and fell striking her head on a cabinet in the room. No loss of consciousness no amensia.  Ed evaluation included a ct scan which revealed an interhemispheric subdural   Exam:   Gen: patient is awake and alert no acute distress some soreness in the scalp no vomiting no nausea.  Ms:  Awake alert oriented x 3 appropriate  Cranial nerves 2-12 are normal  Sens; pp and touch normal  Motor 5.5 strength in all4  Reflexes symmetric and toes go down  c spine is supple  A; chi with interhemispheric subdural  p observe in icu repeat ct in am . Probable will not need a surgical procedure, will follow with you

## 2017-08-27 NOTE — ED Notes (Signed)
Pt reports she was getting out of bed this am and fell in her bedroom.  pts head hit her dresser and she has a laceration on her scalp and skin tear on her right wrist

## 2017-08-27 NOTE — Progress Notes (Signed)
 Patient admitted on 08/27/2017 from home with   Chief Complaint   Patient presents with   . Fall   . Laceration    The patient is being treated for a SDH.    C/O Right ear fullness and dizziness prior to fall at home and wonders if that contributed to her falling.     The patient has been admitted to the hospital 0 times in the past 12 months.    Tentative dc plan: TBD pending progress. Anticipate Outpatient vs Home Health therapy pending OT and PT recommendations (needs orders for evaluation). [facility] is the choice agency if  [facility] is recommended.     Anticipated Discharge Date: 2-3 days pending progress    PCP: Haydee Debby CROME, MD     Specialists: Dr. Vickki- Hematology/Oncology Dr. [place]- Kemp, and Dr. Erminio Dixit-Rheumatology     Dialysis Unit: NA    Pharmacy: [facility] on [address]    DME: Uses the cane primarily but has a walker as well and a walk in tub. Shower chair.     Home Environment: Lives at [address] [place] [address]. Lives alone in a 2 story home with 1 entry step; downstairs bedroom- all needs met on the first floor. Is a member at the [employer]. Independent with ADLs and Mobility. Drives in the immediate area only. Falls at home and a fall at the bank a few months ago. 2 sons live nearby; daughter here visiting from [place] .     Prior to admission open services: None    Home Health Agency-  Personal Care Agency-    Extended Emergency Contact Information  Primary Emergency Contact: Rabold,Christopher J  Address: [address]           [place], [place] [address] [place]  Home Phone: [phone]  Mobile Phone: [phone]  Relation: Son     Transportation: family will transport home    Therapy Recommendations: Not ordered at this time    OT =     PT =     SLP =      RT Home O2 Evaluation =  NA    Wound Care = Head Laceration    [facility] Consulted: No    Case Management  Assessment        Preferred Language for Healthcare Related Communication     Preferred Language for Healthcare Related Communication: English   Spiritual/Ethnic/Cultural/Religious Needs that Should be Incorporated Into Your Care          FUNCTIONAL ASSESSMENT                                          DYSPHAGIA SCREENING                          Difficulty with Secretions            Speech Slurred/Thick/Garbled            ABUSE/NEGLECT SCREENING   Physical Abuse/Neglect: Denies   Sexual Abuse: Denies   Sexual Abuse: Denies   Other Abuse/Issues: Denies          PRIMARY DECISION MAKER  ADVANCE CARE PLANNING (ACP) DOCUMENTS                  Suicide/Psychosocial Screening                                      SAD PERSONS                                                  READMIT RISK TOOL                                  CARE MANAGEMENT INTERVENTIONS       PCP Verified by CM: Yes           Mode of Transport at Discharge: Self       Transition of Care Consult (CM Consult): Discharge Planning, SNF, Home Health                               Current Support Network: Family Lives [place], Lives Alone   Reason for Referral: DCP Rounds   History Provided By: Patient, Medical Record   Patient Orientation: Alert and Oriented, Person, Place, Situation, Self   Cognition: Alert   Support System Response: Unavailable   Previous Living Arrangement: Lives Alone Independent   Home Accessibility: Steps, Multi Level Home(2 story home with 1 entry step. Bedroom and all needs met on 1st floor)   Prior Functional Level: Independent in ADLs/IADLs   Current Functional Level: Assistance with the following:, Mobility   Primary Language: English   Can patient return to prior living arrangement: Yes   Ability to make needs known:: Good                   Types of Needs Identified: Disease Management Education, Treatment Education, ADLs/IADLs, Activity/Exercise, Functions Dependency Needs, Emotional Support    Anticipated Discharge Needs: Home Health Services, Outpatient Clinic Rehab Services           Plan discussed with Pt/Family/Caregiver: Yes   Freedom of Choice Offered: Yes      DISCHARGE LOCATION   Discharge Placement: Unable to determine at this time

## 2017-08-27 NOTE — Consults (Signed)
[facility]  CONSULTATION REPORT  NAME:  [name], [name]  SEX:   F  ADMIT: 08/27/2017  DATE OF CONSULT: 08/27/2017  REFERRING PHYSICIAN:    DOB: Feb 01, 1934  MR#    [id number]  ROOM:  [id number]  ACCT#  [id number]        CHIEF COMPLAINT:  Interhemispheric subdural hematoma, closed head injury, falls.    HISTORY:  The patient is a very nice 84 year old female who was admitted through the emergency room today for observation.  She fell this morning, striking the occiput of her head on a cabinet in her bedroom.  There was no loss of consciousness, no amnesia.  The patient simply became dizzy and she fell when she got out of bed.  She has remained alert, stable throughout her stay.  CT scan of the head showed evidence of interhemispheric subdural hematoma, but no other issues and no surgical lesions.  Neurosurgery was consulted.      Her current exam shows a very nice female.  She is awake and alert and fully oriented.  She is a bit upset that she did fall.    NEUROLOGIC EXAMINATION:  She is completely oriented.  She has a dressing on her head.  Her cervical spine is supple.  She is oriented.  Cranial nerves II-XII are normal.  Strength and sensory exams are normal.  Reflexes are symmetric and her plantar responses are flexor.    MY ASSESSMENT:  This patient is a very nice lady who neurologically is intact.  She does have an interhemispheric subdural.  It is highly unlikely that this will ever require surgical procedure.  I would observe her in the ICU with frequent neurological checks and vital signs repeat her CAT scan tomorrow.  I will order some coags.  Neurosurgery will follow with you.      ___________________  Buck MamAVID C Phenix Vandermeulen MD  Dictated By:.   mad  D:08/27/2017 13:17:58  T: 08/27/2017 14:01:13  [id number]

## 2017-08-27 NOTE — H&P (Signed)
[facility]  History and Physical  NAME:  [name], [name]  SEX:   F  ADMIT: 08/27/2017  DOB:1933-12-22  MR#    [id number]  ROOM:  I409  ACCT#  [id number]    I hereby certify this patient for admission based upon medical necessity as noted below:    <cc: DAVID WATERS MD    CHIEF COMPLAINT:  Closed head injury with small subdural hematoma.    HISTORY OF PRESENT ILLNESS:  The patient is an 84 year old female with a history of rheumatoid arthritis, hypertension, hemochromatosis, hypothyroidism who fell this morning shortly after getting up out of bed.  The patient had gotten up, gotten some clothes out of her dresser, had some combination of lightheadedness and being off balance and fell to her side.  She struck her head on her dresser and eased herself down to the floor.  She had a lot of bleeding from a laceration.  She was brought to the ER for evaluation.    In the ER, initial vitals, she was afebrile, pulse of 55, blood pressure 157/80, respiratory rate 22, O2 saturation 99% on room air.  The patient had a CT scan of her head which shows a small 1 cm subdural hematoma, interhemispheric.  CT of the neck was negative for fracture.  Chest x-ray was negative.  X-ray of her hand was negative.    The patient's EKG shows sinus bradycardia with marked sinus arrhythmia and 1st-degree AV block.  She also has a prolonged QT interval; QTc is 0.511.    The patient is being admitted to the ICU for close observation and neurosurgical consultation.    Lab work showed a mild anemia, hemoglobin 11.3, hematocrit 31.8 with a normal MCV.  Potassium was slightly low at 3.2.  Renal functions normal.  Sodium was a little high, 146.  Troponin I was negative.  Magnesium is low normal at 1.8.  Her TSH is mildly elevated at 5.27, free T4 normal at 1.06.    PAST MEDICAL HISTORY:  1.  No known history of coronary artery disease.  Echocardiogram October 2018 shows an ejection fraction of 55% and grade 1 diastolic dysfunction.  2.  A  48-hour Holter monitor, also from October last year, showed supraventricular ectopy, mostly wandering atrial pacemaker and sinus tachycardia.  3.  Hypertension.  4.  Rheumatoid arthritis.  5.  Hemochromatosis with periodic phlebotomies.  6.  Hypothyroidism, on replacement therapy.  7.  Osteoporosis.  8.  History of diverticulitis.  9.  Multiple surgeries including left hip surgery, right hip surgery, left knee replacement, left femur fracture repaired secondary to a fall, kidney stone extraction, thyroid surgery, total abdominal hysterectomy in 1961 with subsequent bilateral salpingo-oophorectomy, 3 C-sections, partial colectomy, a negative breast biopsy on the left, a laparoscopic surgery, right shoulder surgery and right carpal tunnel surgery.    CURRENT MEDICATIONS:  Include Lasix 40 mg q. Monday, Wednesday and Friday, Plaquenil 200 mg b.i.d., Simponi Aria infusion q. 8 weeks, methotrexate 12.5 mg weekly, folic acid 1 mg p.o. daily, metoprolol 50 mg b.i.d., lisinopril 20 mg daily, levothyroxine 100 mcg daily, potassium 10 mEq daily, enteric-coated aspirin 81 mg daily, recently started on Fosamax 70 mg  weekly.  She also takes vitamin D, vitamin C, fish oil, an eye vitamin and biotin.    ALLERGIES:  SULFA AND SHELLFISH.    SOCIAL HISTORY:  She is a widow.  She has 2 sons in the area and a daughter in [place].  She does not smoke.  She does not drink.    FAMILY HISTORY:  Father died of lung cancer.  She has 2 sisters who had breast cancer.  The patient is scheduled already for a mammogram on the 19th of this month.    IMMUNIZATIONS:  The patient has received Prevnar 13, Pneumovax, flu shot every year.  She had the initial Zostavax and has had the Tdap.      REVIEW OF SYSTEMS:  The patient has a minimal headache, resolved with Tylenol.  She denies any dysphasia, dysarthria, visual changes, nausea, abdominal pain, peripheral numbness, weakness or paresthesias.  She denies any chest pain, pressure or heaviness,  shortness of breath.  She ambulates normally with the assistance of either a walker or a cane due to her multiple orthopedic surgeries.    PHYSICAL EXAMINATION:  GENERAL:  The patient is a very pleasant 85 year old female.  She is awake, alert, oriented times 3, demonstrates a good recollection of the events this morning as well as her prior medical history.  VITAL SIGNS:  Pulse 57, blood pressure 119/55, respiratory rate 26, O2 saturation 4%.    NEUROLOGIC:  Cranial nerves II-XII are intact.  Speech is fluent.  Motor strength is +5.  Sensation is normal in all 4 extremities.  Toes are downgoing bilaterally.  NECK:  There is no jugular venous distention.  No carotid bruits are auscultated.  No thyromegaly.  She has got a healed thyroid scar.  LUNGS:  Clear to auscultation in all fields without crackles, wheezes, rhonchi or dullness.  HEART:  Regular at a rate of about 60 with a grade 3 systolic murmur over the aortic area with a normal S2.  ABDOMEN:  Soft, nontender.  No hepatomegaly is appreciated.  There are no bruits or masses palpable.  EXTREMITIES:  There is no peripheral edema or cyanosis present.    SKIN:  She has a skin tear on the dorsum of the right hand from her fall this morning that is bandaged.  She also has a bandage on her head where she received 4 sutures for a laceration.    ASSESSMENT AND PLAN:  1.  Fall with head injury resulting in a small interhemispheric subdural hematoma.  Neurosurgery has already seen the patient.  She is scheduled for a repeat CT scan of the head tomorrow.  2.  Relative bradycardia with a prolonged QTc interval.  We will continue to hold the patient's metoprolol and lisinopril.  Repeat an EKG in the morning.  3.  Hypokalemia.  Replace.  4.  Hypothyroidism.  Increased from 100 to 112 mcg daily.  5.  Flowtrons for DVT prophylaxis.      ___________________  [name] DO  Dictated By: .   MLT  D:08/27/2017 19:31:05  T: 08/27/2017 19:56:53  [id number]

## 2017-08-27 NOTE — ED Provider Notes (Signed)
[facility]  Emergency Department Treatment Report    Patient: [name] [name] Age: 84 y.o. Sex: female    Date of Birth: [birth year] Admit Date: 08/27/2017 PCP: [name], MD   MRN: [id number]  CSN: [id number]  Attending: Smitty Cords, MD   Room: ER40/ER40 Time Dictated: 8:28 AM APP:  Lavone Nian, PA-C     Chief Complaint   Chief Complaint   Patient presents with   ??? Fall   ??? Laceration         History of Present Illness   84 y.o. female with hypertension, hypothyroidism, rheumatoid arthritis on aspirin 81 mg daily who presents emergency department with head injury with laceration after fall at home.  Patient states that she got out of bed and was moving around her room when she felt dizzy and unsteady and remembers falling to the side.  Patient's daughter is visiting from out of state and states that she heard her fall and immediately was speaking without noted loss of consciousness.  Patient did suffer head injury with a bleeding laceration describes headache and neck pain.  She also has bruising and swelling with a skin tear to her right hand but denies pain.  She denies headache, chest pain or shortness of breath prior to the fall.  She did not ambulate after the fall.  She states that she typically walks with a cane or a walker.  She denies history of heart disease or strokes in the past.  She was recently discontinued from Xarelto due to DVT approximately 7 days ago.  She currently lives at home alone.  Review of Systems   Constitutional: No fever, chills. +weakness.   Eyes: No blurred vision, double vision, or loss of vision.  ENT: No sore throat, congestion or ear pain.  Respiratory: No cough, shortness of breath or wheezing.  Cardiovascular: No chest pain/pressure or palpitations. No syncope.  Gastrointestinal: No abdominal pain. No nausea, vomiting, diarrhea. No melena, hematochezia, or hematemesis.  Genitourinary: No painful urination, frequency, or urgency.  Musculoskeletal:  +neck pain.   Integumentary: +right hand pain.  Neurological: + headache. +dizziness. No extremity weakness or paresthesias.     Past Medical/Surgical History     Past Medical History:   Diagnosis Date   ??? Arthritis    ??? Deep venous thrombosis (HCC)    ??? Endocrine disease     hypothyroid   ??? Hemochromatosis    ??? Hypertension      Past Surgical History:   Procedure Laterality Date   ??? HX ORTHOPAEDIC      left knee replacement     Social History     Social History     Socioeconomic History   ??? Marital status: WIDOWED     Spouse name: Not on file   ??? Number of children: Not on file   ??? Years of education: Not on file   ??? Highest education level: Not on file   Tobacco Use   ??? Smoking status: Never Smoker   ??? Smokeless tobacco: Never Used   Substance and Sexual Activity   ??? Alcohol use: No   ??? Drug use: No     Family History   History reviewed. No pertinent family history.    Home Medications     Prior to Admission Medications   Prescriptions Last Dose Informant Patient Reported? Taking?   HYDROcodone-acetaminophen (NORCO) 5-325 mg per tablet   No Yes   Sig: Take 1 Tab by mouth every  four (4) hours as needed for Pain. Max Daily Amount: 6 Tabs.   levothyroxine (SYNTHROID) 125 mcg tablet   Yes Yes   Sig: Take  by mouth Daily (before breakfast).   lisinopril (PRINIVIL, ZESTRIL) 10 mg tablet   Yes Yes   Sig: Take  by mouth daily.   predniSONE (DELTASONE) 10 mg tablet Not Taking at Unknown time  Yes No   Sig: Take  by mouth daily (with breakfast).      Facility-Administered Medications: None     Allergies     Allergies   Allergen Reactions   ??? Shellfish Derived Hives     Physical Exam     ED Triage Vitals [08/27/17 0801]   Enc Vitals Group      BP 157/80      Pulse (Heart Rate) (!) 55      Resp Rate 22      Temp 97.5 ??F (36.4 ??C)      Temp src       O2 Sat (%) 99 %      Weight 160 lb      Height 5\' 2"       Head Circumference       Peak Flow       Pain Score       Pain Loc       Pain Edu?       Excl. in GC?      General:  Patient appears well developed and well nourished.   HEENT: Conjunctiva clear. PERRL.  EOMs intact.  Left TM clear without injection or hemotympanum, difficulty visualizing right TM due to cerumen.  No battle sign noted.  No raccoon eyes or step-off deformity.  Scalp laceration approximately 2-1/2 cm right occipital region.  Mucous membranes moist, non-erythematous.   Neck: Supple, symmetrical.  No focal cervical midline tenderness to palpation.    Respiratory: Lungs clear to auscultation, nonlabored respirations. No wheezes, rhonchi, or rales.  Cardiovascular: Heart regular rate and rhythm  Gastrointestinal: Normal active bowel sounds.  Abdomen soft nontender.  Musculoskeletal: Positive for right dorsum of the hand with ecchymosis and 5 cm skin tear with no appreciated tenderness to palpation.  No wrist or forearm tenderness palpation.  No hip or pelvis tenderness to palpation with hip passive range of motion performed without pain.  Patient able to perform hip flexion, ankle dorsiflexion, plantarflexion with plus 5 out of 5 strength.  Hip strength and forearm flexion equal and intact bilaterally.  Dorsalis pedis and radial pulses equal and intact bilaterally.  Upper extremity lower extremity strength and sensation intact bilaterally.  Integumentary: 2.5cm occipital scalp laceration without other visible fracture   Neurologic: Alert and oriented to person, place, DOB.. Finger to nose coordination intact bilat. RAM hand flip intact bilat. UE and LE strength and sensation intact bilat. No pronator drift. Negative clonus. No dysarthria or facial asymmetry.     Impression and Management Plan   Patient with head injury, we will obtain head and neck CT as well as we will perform repair of laceration to the right hand and scalp.  Patient with dizziness prior to falling we will obtain basic labs EKG and chest x-ray.  Diagnostic Studies   Lab:   Recent Results (from the past 12 hour(s))   CBC WITH AUTOMATED DIFF     Collection Time: 08/27/17  9:30 AM   Result Value Ref Range    WBC 5.2 4.0 - 11.0 1000/mm3    RBC 3.27 (L) 3.60 -  5.20 M/uL    HGB 11.3 (L) 13.0 - 17.2 gm/dl    HCT 95.631.8 (L) 21.337.0 - 50.0 %    MCV 97.2 80.0 - 98.0 fL    MCH 34.6 25.4 - 34.6 pg    MCHC 35.5 30.0 - 36.0 gm/dl    PLATELET 086128 (L) 578140 - 450 1000/mm3    MPV 11.5 (H) 6.0 - 10.0 fL    RDW-SD 47.3 (H) 36.4 - 46.3      NRBC 0 0 - 0      IMMATURE GRANULOCYTES 0.6 0.0 - 3.0 %    NEUTROPHILS 63.8 34 - 64 %    LYMPHOCYTES 23.5 (L) 28 - 48 %    MONOCYTES 8.0 1 - 13 %    EOSINOPHILS 3.7 0 - 5 %    BASOPHILS 0.4 0 - 3 %   METABOLIC PANEL, BASIC    Collection Time: 08/27/17  9:30 AM   Result Value Ref Range    Sodium 146 (H) 136 - 145 mEq/L    Potassium 3.2 (L) 3.5 - 5.1 mEq/L    Chloride 109 (H) 98 - 107 mEq/L    CO2 27 21 - 32 mEq/L    Glucose 141 (H) 74 - 106 mg/dl    BUN 16 7 - 25 mg/dl    Creatinine 0.7 0.6 - 1.3 mg/dl    GFR est AA >46.9>60.0      GFR est non-AA >60      Calcium 9.1 8.5 - 10.1 mg/dl    Anion gap 9 5 - 15 mmol/L   TROPONIN I    Collection Time: 08/27/17  9:30 AM   Result Value Ref Range    Troponin-I 0.037 0.000 - 0.045 ng/ml   EKG, 12 LEAD, INITIAL    Collection Time: 08/27/17  9:46 AM   Result Value Ref Range    Ventricular Rate 56 BPM    Atrial Rate 56 BPM    P-R Interval 234 ms    QRS Duration 90 ms    Q-T Interval 530 ms    QTC Calculation (Bezet) 511 ms    Calculated P Axis 25 degrees    Calculated R Axis -28 degrees    Calculated T Axis -8 degrees    Diagnosis       Sinus bradycardia with marked sinus arrhythmia with 1st degree AV block  Prolonged QT  Abnormal ECG  When compared with ECG of 04-May-2012 10:06,  premature atrial complexes are no longer present  PR interval has increased  Borderline criteria for Anterior infarct are no longer present  T wave inversion now evident in Inferior leads  QT has lengthened  Confirmed by Avon GullySposato, [name], Jomarie LongsJoseph ([id number]) on 08/27/2017 11:20:50 AM     GLUCOSE, POC    Collection Time: 08/27/17 11:39 AM   Result  Value Ref Range    Glucose (POC) 127 (H) 65 - 105 mg/dL     EKG: Sinus rhythm with prolonged QT without ST elevation or depression read by ED attending .    Imaging:    Xr Chest Pa Lat    Result Date: 08/27/2017  EXAM: Frontal and lateral views of the chest. INDICATIONS: Near syncope COMPARISON: Chest radiograph dated 05/16/2015 FINDINGS: No alveolar consolidations, congestive changes or pleural effusions. Cardiomediastinal silhouette unchanged in size and contour. Persistent prominence of the cardiac silhouette. Degenerative changes of the shoulders.     IMPRESSION: No acute cardiopulmonary process.     Xr Hand Rt Min 3 V  Result Date: 08/27/2017  STUDY: 3 views of the right hand. INDICATION: Trauma. COMPARISON: Radiograph dated 09/02/2009.     IMPRESSION: No evidence of an acute fracture or dislocation. Diffuse moderate to severe degenerative changes.     Ct Head Wo Cont    Result Date: 08/27/2017  Examination: CT head without contrast. INDICATION: Dizziness. Fall. COMPARISON: none TECHNIQUE: CT head without contrast. DICOM format image data is available to non-affiliated external healthcare facilities or entities on a secure, media free, reciprocally searchable basis with patient authorization for 12 months following the date of the study. FINDINGS: Acute subdural hemorrhage involving the interhemispheric fissure measuring 1 cm in thickness. Diffuse parenchymal volume loss and scattered chronic small vessel ischemic change. Mucosal thickening involving the left maxillary sinus.     IMPRESSION: 1. Acute interhemispheric fissure subdural hemorrhage. 2. Chronic small vessel ischemic change.     Ct Spine Cerv Wo Cont    Result Date: 08/27/2017  Examination: CT cervical spine. INDICATION: Trauma. TECHNIQUE: CT cervical spine without contrast. DICOM format image data is available to non-affiliated external healthcare facilities or entities on a secure, media free, reciprocally searchable basis with patient authorization for  12 months following the date of the study. COMPARISON: CT dated 05/01/2016. FINDINGS: No evidence of an acute fracture or traumatic subluxation. Chronic appearing fracture deformity with some degree of superimposed healing involving the right posterior T2 rib. The odontoid appears intact. Atlantodental interval within normal limits. No prevertebral soft tissue swelling. Vertebral body heights maintained. Multilevel degenerative changes. Visualized lung apices are clear.     IMPRESSION: 1. No acute fracture or traumatic subluxation. 2. Nonacute fracture deformity of the right posterior second rib. 3. Multilevel degenerative changes.     ED Course/ Medical Decision Making   Wound Repair  Date/Time: 08/27/2017 12:04 PM  Performed by: PAPreparation: skin prepped with Shur-Clens  Location: right hand.  Wound length: 5.  Foreign bodies: no foreign bodies  Skin closure: Steri-Strips  Approximation: close  Dressing: pressure dressing  Patient tolerance: Patient tolerated the procedure well with no immediate complications  My total time at bedside, performing this procedure was 1-15 minutes.  Wound Repair  Date/Time: 08/27/2017 12:05 PM  Performed by: PAPreparation: skin prepped with Shur-Clens  Location details: scalp  Wound length: 2.5.  Anesthesia: local infiltration    Anesthesia:  Local Anesthetic: lidocaine 1% with epinephrine  Foreign bodies: no foreign bodies  Irrigation solution: saline  Skin closure: staples  Number of sutures: 4.  Technique: simple  Approximation: close  Dressing: 4x4, gauze roll and pressure dressing  Patient tolerance: Patient tolerated the procedure well with no immediate complications  My total time at bedside, performing this procedure was 16-30 minutes.          Patient's EKG shows prolonged QT will avoid giving Zofran and another medication second prolonged QT.  CT of the head shows subdural with CT of the cervical spine that shows no evidence of acute fractures.  Patient has been off Xarelto  for 1 week and has been taking only aspirin 81 mg daily.  We will plan to admit this patient.  ED attending is spoken with the hospitalist for admission as well as specialist.    Medications   sodium chloride 0.9 % bolus infusion 500 mL (0 mL IntraVENous IV Completed 08/27/17 1118)   diph,Pertuss(AC),Tet Vac-PF (BOOSTRIX) suspension 0.5 mL (0.5 mL IntraMUSCular Given 08/27/17 0954)   dextrose 5% - 0.45% NaCl with KCl 20 mEq/L infusion (150 mL/hr IntraVENous New Bag 08/27/17  1156)       Current Discharge Medication List        Attending note by Dr. Rolm Gala Demetrion Wesby:I, Smitty Cords, MD , have personally seen and examined this patient; I have fully participated in the care of this patient with the advanced practice provider.  I have reviewed and agree with all pertinent clinical information including history, physical exam, studies and the plan.  I have also reviewed and agree with the medications, allergies and past medical history sections for this patient.  She was alert and oriented.  She thinks she just felt dizzy lightheaded and fell.  She had a headache originally which is improving.  Had subdural hematoma identified.  Dr. Buck Mam consulted through his team.  Placed on the service of Dr. Emmit Alexanders for work-up of the dizziness careful follow-up of the potentially life-threatening subdural hematoma in ICU.  Neurologically completely intact at this time.  No Extremity pain or tenderness on my exam.  Lungs clear. Critical care time excluding procedures, but including direct patient care, reviewing medical records, evaluating results of diagnostic testing, discussions with family members, and consulting with physicians:*45**minutes  Smitty Cords, MD  August 27, 2017      Final Diagnosis       ICD-10-CM ICD-9-CM   1. Subdural hematoma due to concussion, without loss of consciousness, initial encounter (HCC) S06.5X0A 852.21   2. Dizziness on standing R42 780.4       Disposition   Admission    The patient was personally evaluated  by myself and Ayeden Gladman, Wetzel Bjornstad, MD who agrees with the above assessment and plan    Lavone Nian, PA-C  August 27, 2017    My signature above authenticates this document and my orders, the final ??  diagnosis (es), discharge prescription (s), and instructions in the Epic ??  record.  If you have any questions please contact [phone].  ??  Nursing notes have been reviewed by the physician/ advanced practice ??  Clinician.

## 2017-08-27 NOTE — Consults (Signed)
Neurosurgery Consult:  Cc: interhemispheric subdural hematoma, scalp laceration and closed head injury  Hx;  84 year old female admitted through the ed for evaluation and observation  Had been on Xeralto for dvt, discontinuedseveral weeks ago.  Patient got up this morning, became dizzy and fell striking her head on a cabinet in the room. No loss of consciousness no amensia.  Ed evaluation included a ct scan which revealed an interhemispheric subdural   Exam:   Gen: patient is awake and alert no acute distress some soreness in the scalp no vomiting no nausea.  Ms:  Awake alert oriented x 3 appropriate  Cranial nerves 2-12 are normal  Sens; pp and touch normal  Motor 5.5 strength in all4  Reflexes symmetric and toes go down  c spine is supple  A; chi with interhemispheric subdural  p observe in icu repeat ct in am . Probable will not need a surgical procedure, will follow with you

## 2017-08-27 NOTE — Consults (Signed)
[facility]  CONSULTATION REPORT  NAME:  [name], [name]  SEX:   F  ADMIT: 08/27/2017  DATE OF CONSULT: 08/27/2017  REFERRING PHYSICIAN:    DOB: 04/12/1933  MR#    [id number]  ROOM:  I409  ACCT#  [id number]        CHIEF COMPLAINT:  Interhemispheric subdural hematoma, closed head injury, falls.    HISTORY:  The patient is a very nice 84-year-old female who was admitted through the emergency room today for observation.  She fell this morning, striking the occiput of her head on a cabinet in her bedroom.  There was no loss of consciousness, no amnesia.  The patient simply became dizzy and she fell when she got out of bed.  She has remained alert, stable throughout her stay.  CT scan of the head showed evidence of interhemispheric subdural hematoma, but no other issues and no surgical lesions.  Neurosurgery was consulted.      Her current exam shows a very nice female.  She is awake and alert and fully oriented.  She is a bit upset that she did fall.    NEUROLOGIC EXAMINATION:  She is completely oriented.  She has a dressing on her head.  Her cervical spine is supple.  She is oriented.  Cranial nerves II-XII are normal.  Strength and sensory exams are normal.  Reflexes are symmetric and her plantar responses are flexor.    MY ASSESSMENT:  This patient is a very nice lady who neurologically is intact.  She does have an interhemispheric subdural.  It is highly unlikely that this will ever require surgical procedure.  I would observe her in the ICU with frequent neurological checks and vital signs repeat her CAT scan tomorrow.  I will order some coags.  Neurosurgery will follow with you.      ___________________  Rekisha Welling C Kamee Bobst MD  Dictated By:.   mad  D:08/27/2017 13:17:58  T: 08/27/2017 14:01:13  [id number]

## 2017-08-27 NOTE — Other (Signed)
TRANSFER - OUT REPORT:    Verbal report given to carla(name) on [name] MungoBarbara K [name]  being transferred to icu 4 unit) for change in patient condition(admit)       Report consisted of patient???s Situation, Background, Assessment and   Recommendations(SBAR).     Information from the following report(s) ED Summary was reviewed with the receiving nurse.    Lines:   Peripheral IV 08/27/17 Left Arm (Active)   Site Assessment Clean, dry, & intact 08/27/2017 10:20 AM   Phlebitis Assessment 0 08/27/2017 10:20 AM   Infiltration Assessment 0 08/27/2017 10:20 AM   Dressing Status Clean, dry, & intact 08/27/2017 10:20 AM       Peripheral IV 08/27/17 Left Forearm (Active)   Site Assessment Clean, dry, & intact 08/27/2017  9:30 AM   Phlebitis Assessment 0 08/27/2017  9:30 AM   Infiltration Assessment 0 08/27/2017  9:30 AM   Dressing Status Clean, dry, & intact 08/27/2017  9:30 AM   Dressing Type Transparent 08/27/2017  9:30 AM   Hub Color/Line Status Blue;Green;Purple;Flushed;Patent 08/27/2017  9:30 AM   Action Taken Blood drawn 08/27/2017  9:30 AM   Alcohol Cap Used No 08/27/2017  9:30 AM        Opportunity for questions and clarification was provided.      Patient transported with:   Registered Nurse

## 2017-08-27 NOTE — Progress Notes (Signed)
Patient admitted on 08/27/2017 from home with   Chief Complaint   Patient presents with   ??? Fall   ??? Laceration    The patient is being treated for a SDH.    C/O Right ear fullness and dizziness prior to fall at home and wonders if that contributed to her falling.     The patient has been admitted to the hospital 0 times in the past 12 months.    Tentative dc plan: TBD pending progress. Anticipate Outpatient vs Home Health therapy pending OT and PT recommendations (needs orders for evaluation). [facility] is the choice agency if  [facility] is recommended.     Anticipated Discharge Date: 2-3 days pending progress    PCP: Colen Darling, MD     Specialists: Dr. Keturah Barre- Hematology/Oncology Dr. [place]- Manson Passey, and Dr. Hassan Rowan Dixit-Rheumatology     Dialysis Unit: NA    Pharmacy: [facility] on [address]    DME: Uses the cane primarily but has a walker as well and a walk in tub. Shower chair.     Home Environment: Lives at [place] [address]. Lives alone in a 2 story home with 1 entry step; downstairs bedroom- all needs met on the first floor. Is a member at [employer]. Independent with ADLs and Mobility. Drives in the immediate area only. Falls at home and a fall at the bank a few months ago. 2 sons live nearby; daughter here visiting from [place].     Prior to admission open services: None    [place]-    Extended Emergency Contact Information  Primary Emergency Contact: Pickup,Christopher J  Address: [place] [address] [address] Phone: [phone]  Mobile Phone: [phone]  Relation: Son     Transportation: family will transport home    Therapy Recommendations: Not ordered at this time    OT =     PT =     SLP =      RT Home O2 Evaluation =  NA    Wound Care = Head Laceration    [facility] Consulted: No    Case Management Assessment         Preferred Language for Healthcare Related Communication     Preferred Language for Healthcare Related Communication: English   Spiritual/Ethnic/Cultural/Religious Needs that Should be [place] SCREENING                          Difficulty with Secretions            Speech Slurred/Thick/Garbled            ABUSE/NEGLECT SCREENING   Physical Abuse/Neglect: Denies   Sexual Abuse: Denies   Sexual Abuse: Denies   Other Abuse/Issues: Denies          PRIMARY DECISION MAKER  ADVANCE CARE PLANNING (ACP) DOCUMENTS                  Suicide/Psychosocial Screening                                      SAD PERSONS                                                  READMIT RISK TOOL                                  CARE MANAGEMENT INTERVENTIONS       PCP Verified by CM: Yes           Mode of Transport at Discharge: Self       Transition of Care Consult (CM Consult): Discharge Planning, SNF, [place]: Family Lives [place], Lives Alone   Reason for Referral: DCP Rounds   History Provided By: Patient, Medical Record   Patient Orientation: Alert and Oriented, Person, Place, Situation, Self   Cognition: Alert   Support System Response: Unavailable   Previous Living Arrangement: Lives Alone Independent   Home Accessibility: Steps, Multi Level Home(2 story home with 1 entry step. Bedroom and all needs met on 1st floor)   Prior Functional Level: Independent in ADLs/IADLs   Current Functional Level: Assistance with the following:, Mobility   Primary Language: English   Can patient return to prior living arrangement: Yes   Ability to make needs known:: Good                   Types of Needs Identified: Disease Management Education, Treatment Education, ADLs/IADLs, Activity/Exercise, Functions Dependency Needs, Emotional Support    Anticipated Discharge Needs: [place] discussed with Pt/Family/Caregiver: Yes   Freedom of Choice Offered: Yes      DISCHARGE LOCATION   Discharge Placement: Unable to determine at this time

## 2017-08-27 NOTE — ED Provider Notes (Addendum)
[facility]  Emergency Department Treatment Report    Patient: [name] MungoBarbara K [name] Age: 84 y.o. Sex: female    Date of Birth: [birth year] Admit Date: 08/27/2017 PCP: Blenda BridegroomMauser, Thomas L, MD   MRN: [id number]  CSN: [id number]  Attending: Smitty CordsKISA, Nell Schrack H, MD   Room: ER40/ER40 Time Dictated: 8:28 AM APP:  Lavone NianBrittany Sossong, PA-C     Chief Complaint   Chief Complaint   Patient presents with   ??? Fall   ??? Laceration         History of Present Illness   84 y.o. female with hypertension, hypothyroidism, rheumatoid arthritis on aspirin 81 mg daily who presents emergency department with head injury with laceration after fall at home.  Patient states that she got out of bed and was moving around her room when she felt dizzy and unsteady and remembers falling to the side.  Patient's daughter is visiting from out of state and states that she heard her fall and immediately was speaking without noted loss of consciousness.  Patient did suffer head injury with a bleeding laceration describes headache and neck pain.  She also has bruising and swelling with a skin tear to her right hand but denies pain.  She denies headache, chest pain or shortness of breath prior to the fall.  She did not ambulate after the fall.  She states that she typically walks with a cane or a walker.  She denies history of heart disease or strokes in the past.  She was recently discontinued from Xarelto due to DVT approximately 7 days ago.  She currently lives at home alone.  Review of Systems   Constitutional: No fever, chills. +weakness.   Eyes: No blurred vision, double vision, or loss of vision.  ENT: No sore throat, congestion or ear pain.  Respiratory: No cough, shortness of breath or wheezing.  Cardiovascular: No chest pain/pressure or palpitations. No syncope.  Gastrointestinal: No abdominal pain. No nausea, vomiting, diarrhea. No melena, hematochezia, or hematemesis.  Genitourinary: No painful urination, frequency, or urgency.   Musculoskeletal: +neck pain.   Integumentary: +right hand pain.  Neurological: + headache. +dizziness. No extremity weakness or paresthesias.     Past Medical/Surgical History     Past Medical History:   Diagnosis Date   ??? Arthritis    ??? Deep venous thrombosis (HCC)    ??? Endocrine disease     hypothyroid   ??? Hemochromatosis    ??? Hypertension      Past Surgical History:   Procedure Laterality Date   ??? HX ORTHOPAEDIC      left knee replacement     Social History     Social History     Socioeconomic History   ??? Marital status: WIDOWED     Spouse name: Not on file   ??? Number of children: Not on file   ??? Years of education: Not on file   ??? Highest education level: Not on file   Tobacco Use   ??? Smoking status: Never Smoker   ??? Smokeless tobacco: Never Used   Substance and Sexual Activity   ??? Alcohol use: No   ??? Drug use: No     Family History   History reviewed. No pertinent family history.    Home Medications     Prior to Admission Medications   Prescriptions Last Dose Informant Patient Reported? Taking?   HYDROcodone-acetaminophen (NORCO) 5-325 mg per tablet   No Yes   Sig: Take 1 Tab by mouth every  four (4) hours as needed for Pain. Max Daily Amount: 6 Tabs.   levothyroxine (SYNTHROID) 125 mcg tablet   Yes Yes   Sig: Take  by mouth Daily (before breakfast).   lisinopril (PRINIVIL, ZESTRIL) 10 mg tablet   Yes Yes   Sig: Take  by mouth daily.   predniSONE (DELTASONE) 10 mg tablet Not Taking at Unknown time  Yes No   Sig: Take  by mouth daily (with breakfast).      Facility-Administered Medications: None     Allergies     Allergies   Allergen Reactions   ??? Shellfish Derived Hives     Physical Exam     ED Triage Vitals [08/27/17 0801]   Enc Vitals Group      BP 157/80      Pulse (Heart Rate) (!) 55      Resp Rate 22      Temp 97.5 ??F (36.4 ??C)      Temp src       O2 Sat (%) 99 %      Weight 160 lb      Height 5\' 2"       Head Circumference       Peak Flow       Pain Score       Pain Loc       Pain Edu?       Excl. in GC?       General: Patient appears well developed and well nourished.   HEENT: Conjunctiva clear. PERRL.  EOMs intact.  Left TM clear without injection or hemotympanum, difficulty visualizing right TM due to cerumen.  No battle sign noted.  No raccoon eyes or step-off deformity.  Scalp laceration approximately 2-1/2 cm right occipital region.  Mucous membranes moist, non-erythematous.   Neck: Supple, symmetrical.  No focal cervical midline tenderness to palpation.    Respiratory: Lungs clear to auscultation, nonlabored respirations. No wheezes, rhonchi, or rales.  Cardiovascular: Heart regular rate and rhythm  Gastrointestinal: Normal active bowel sounds.  Abdomen soft nontender.  Musculoskeletal: Positive for right dorsum of the hand with ecchymosis and 5 cm skin tear with no appreciated tenderness to palpation.  No wrist or forearm tenderness palpation.  No hip or pelvis tenderness to palpation with hip passive range of motion performed without pain.  Patient able to perform hip flexion, ankle dorsiflexion, plantarflexion with plus 5 out of 5 strength.  Hip strength and forearm flexion equal and intact bilaterally.  Dorsalis pedis and radial pulses equal and intact bilaterally.  Upper extremity lower extremity strength and sensation intact bilaterally.  Integumentary: 2.5cm occipital scalp laceration without other visible fracture   Neurologic: Alert and oriented to person, place, DOB.. Finger to nose coordination intact bilat. RAM hand flip intact bilat. UE and LE strength and sensation intact bilat. No pronator drift. Negative clonus. No dysarthria or facial asymmetry.     Impression and Management Plan   Patient with head injury, we will obtain head and neck CT as well as we will perform repair of laceration to the right hand and scalp.  Patient with dizziness prior to falling we will obtain basic labs EKG and chest x-ray.  Diagnostic Studies   Lab:   Recent Results (from the past 12 hour(s))    CBC WITH AUTOMATED DIFF    Collection Time: 08/27/17  9:30 AM   Result Value Ref Range    WBC 5.2 4.0 - 11.0 1000/mm3    RBC 3.27 (L) 3.60 -  5.20 M/uL    HGB 11.3 (L) 13.0 - 17.2 gm/dl    HCT 16.1 (L) 09.6 - 50.0 %    MCV 97.2 80.0 - 98.0 fL    MCH 34.6 25.4 - 34.6 pg    MCHC 35.5 30.0 - 36.0 gm/dl    PLATELET 045 (L) 409 - 450 1000/mm3    MPV 11.5 (H) 6.0 - 10.0 fL    RDW-SD 47.3 (H) 36.4 - 46.3      NRBC 0 0 - 0      IMMATURE GRANULOCYTES 0.6 0.0 - 3.0 %    NEUTROPHILS 63.8 34 - 64 %    LYMPHOCYTES 23.5 (L) 28 - 48 %    MONOCYTES 8.0 1 - 13 %    EOSINOPHILS 3.7 0 - 5 %    BASOPHILS 0.4 0 - 3 %   METABOLIC PANEL, BASIC    Collection Time: 08/27/17  9:30 AM   Result Value Ref Range    Sodium 146 (H) 136 - 145 mEq/L    Potassium 3.2 (L) 3.5 - 5.1 mEq/L    Chloride 109 (H) 98 - 107 mEq/L    CO2 27 21 - 32 mEq/L    Glucose 141 (H) 74 - 106 mg/dl    BUN 16 7 - 25 mg/dl    Creatinine 0.7 0.6 - 1.3 mg/dl    GFR est AA >81.1      GFR est non-AA >60      Calcium 9.1 8.5 - 10.1 mg/dl    Anion gap 9 5 - 15 mmol/L   TROPONIN I    Collection Time: 08/27/17  9:30 AM   Result Value Ref Range    Troponin-I 0.037 0.000 - 0.045 ng/ml   EKG, 12 LEAD, INITIAL    Collection Time: 08/27/17  9:46 AM   Result Value Ref Range    Ventricular Rate 56 BPM    Atrial Rate 56 BPM    P-R Interval 234 ms    QRS Duration 90 ms    Q-T Interval 530 ms    QTC Calculation (Bezet) 511 ms    Calculated P Axis 25 degrees    Calculated R Axis -28 degrees    Calculated T Axis -8 degrees    Diagnosis       Sinus bradycardia with marked sinus arrhythmia with 1st degree AV block  Prolonged QT  Abnormal ECG  When compared with ECG of 04-May-2012 10:06,  premature atrial complexes are no longer present  PR interval has increased  Borderline criteria for Anterior infarct are no longer present  T wave inversion now evident in Inferior leads  QT has lengthened  Confirmed by Avon Gully, [name], Jomarie Longs ([id number]) on 08/27/2017 11:20:50 AM     GLUCOSE, POC     Collection Time: 08/27/17 11:39 AM   Result Value Ref Range    Glucose (POC) 127 (H) 65 - 105 mg/dL     EKG: Sinus rhythm with prolonged QT without ST elevation or depression read by ED attending .    Imaging:    Xr Chest Pa Lat    Result Date: 08/27/2017  EXAM: Frontal and lateral views of the chest. INDICATIONS: Near syncope COMPARISON: Chest radiograph dated 05/16/2015 FINDINGS: No alveolar consolidations, congestive changes or pleural effusions. Cardiomediastinal silhouette unchanged in size and contour. Persistent prominence of the cardiac silhouette. Degenerative changes of the shoulders.     IMPRESSION: No acute cardiopulmonary process.     Xr Hand Rt Min 3 V  Result Date: 08/27/2017  STUDY: 3 views of the right hand. INDICATION: Trauma. COMPARISON: Radiograph dated 09/02/2009.     IMPRESSION: No evidence of an acute fracture or dislocation. Diffuse moderate to severe degenerative changes.     Ct Head Wo Cont    Result Date: 08/27/2017  Examination: CT head without contrast. INDICATION: Dizziness. Fall. COMPARISON: none TECHNIQUE: CT head without contrast. DICOM format image data is available to non-affiliated external healthcare facilities or entities on a secure, media free, reciprocally searchable basis with patient authorization for 12 months following the date of the study. FINDINGS: Acute subdural hemorrhage involving the interhemispheric fissure measuring 1 cm in thickness. Diffuse parenchymal volume loss and scattered chronic small vessel ischemic change. Mucosal thickening involving the left maxillary sinus.     IMPRESSION: 1. Acute interhemispheric fissure subdural hemorrhage. 2. Chronic small vessel ischemic change.     Ct Spine Cerv Wo Cont    Result Date: 08/27/2017  Examination: CT cervical spine. INDICATION: Trauma. TECHNIQUE: CT cervical spine without contrast. DICOM format image data is available to non-affiliated external healthcare facilities or entities on a secure,  media free, reciprocally searchable basis with patient authorization for 12 months following the date of the study. COMPARISON: CT dated 05/01/2016. FINDINGS: No evidence of an acute fracture or traumatic subluxation. Chronic appearing fracture deformity with some degree of superimposed healing involving the right posterior T2 rib. The odontoid appears intact. Atlantodental interval within normal limits. No prevertebral soft tissue swelling. Vertebral body heights maintained. Multilevel degenerative changes. Visualized lung apices are clear.     IMPRESSION: 1. No acute fracture or traumatic subluxation. 2. Nonacute fracture deformity of the right posterior second rib. 3. Multilevel degenerative changes.     ED Course/ Medical Decision Making   Wound Repair  Date/Time: 08/27/2017 12:04 PM  Performed by: PAPreparation: skin prepped with Shur-Clens  Location: right hand.  Wound length: 5.  Foreign bodies: no foreign bodies  Skin closure: Steri-Strips  Approximation: close  Dressing: pressure dressing  Patient tolerance: Patient tolerated the procedure well with no immediate complications  My total time at bedside, performing this procedure was 1-15 minutes.  Wound Repair  Date/Time: 08/27/2017 12:05 PM  Performed by: PAPreparation: skin prepped with Shur-Clens  Location details: scalp  Wound length: 2.5.  Anesthesia: local infiltration    Anesthesia:  Local Anesthetic: lidocaine 1% with epinephrine  Foreign bodies: no foreign bodies  Irrigation solution: saline  Skin closure: staples  Number of sutures: 4.  Technique: simple  Approximation: close  Dressing: 4x4, gauze roll and pressure dressing  Patient tolerance: Patient tolerated the procedure well with no immediate complications  My total time at bedside, performing this procedure was 16-30 minutes.          Patient's EKG shows prolonged QT will avoid giving Zofran and another medication second prolonged QT.  CT of the head shows subdural with CT of  the cervical spine that shows no evidence of acute fractures.  Patient has been off Xarelto for 1 week and has been taking only aspirin 81 mg daily.  We will plan to admit this patient.  ED attending is spoken with the hospitalist for admission as well as specialist.    Medications   sodium chloride 0.9 % bolus infusion 500 mL (0 mL IntraVENous IV Completed 08/27/17 1118)   diph,Pertuss(AC),Tet Vac-PF (BOOSTRIX) suspension 0.5 mL (0.5 mL IntraMUSCular Given 08/27/17 0954)   dextrose 5% - 0.45% NaCl with KCl 20 mEq/L infusion (150 mL/hr IntraVENous New Bag 08/27/17  1156)       Current Discharge Medication List        Attending note by Dr. Rolm Gala Ivelis Norgard:I, Smitty Cords, MD , have personally seen and examined this patient; I have fully participated in the care of this patient with the advanced practice provider.  I have reviewed and agree with all pertinent clinical information including history, physical exam, studies and the plan.  I have also reviewed and agree with the medications, allergies and past medical history sections for this patient.  She was alert and oriented.  She thinks she just felt dizzy lightheaded and fell.  She had a headache originally which is improving.  Had subdural hematoma identified.  Dr. Buck Mam consulted through his team.  Placed on the service of Dr. Emmit Alexanders for work-up of the dizziness careful follow-up of the potentially life-threatening subdural hematoma in ICU.  Neurologically completely intact at this time.  No Extremity pain or tenderness on my exam.  Lungs clear. Critical care time excluding procedures, but including direct patient care, reviewing medical records, evaluating results of diagnostic testing, discussions with family members, and consulting with physicians:*45**minutes  Smitty Cords, MD  August 27, 2017      Final Diagnosis       ICD-10-CM ICD-9-CM   1. Subdural hematoma due to concussion, without loss of consciousness, initial encounter (HCC) S06.5X0A 852.21    2. Dizziness on standing R42 780.4       Disposition   Admission    The patient was personally evaluated by myself and Muzamil Harker, Wetzel Bjornstad, MD who agrees with the above assessment and plan    Lavone Nian, PA-C  August 27, 2017    My signature above authenticates this document and my orders, the final ??  diagnosis (es), discharge prescription (s), and instructions in the Epic ??  record.  If you have any questions please contact [phone].  ??  Nursing notes have been reviewed by the physician/ advanced practice ??  Clinician.

## 2017-08-27 NOTE — H&P (Signed)
[facility]  History and Physical  NAME:  [name] [name], [name] [name]  SEX:   F  ADMIT: 08/27/2017  DOB:12-08-1933  MR#    [id number]  ROOM:  I409  ACCT#  [id number]    I hereby certify this patient for admission based upon medical necessity as noted below:    <cc: DAVID WATERS MD    CHIEF COMPLAINT:  Closed head injury with small subdural hematoma.    HISTORY OF PRESENT ILLNESS:  The patient is an 84 year old female with a history of rheumatoid arthritis, hypertension, hemochromatosis, hypothyroidism who fell this morning shortly after getting up out of bed.  The patient had gotten up, gotten some clothes out of her dresser, had some combination of lightheadedness and being off balance and fell to her side.  She struck her head on her dresser and eased herself down to the floor.  She had a lot of bleeding from a laceration.  She was brought to the ER for evaluation.    In the ER, initial vitals, she was afebrile, pulse of 55, blood pressure 157/80, respiratory rate 22, O2 saturation 99% on room air.  The patient had a CT scan of her head which shows a small 1 cm subdural hematoma, interhemispheric.  CT of the neck was negative for fracture.  Chest x-ray was negative.  X-ray of her hand was negative.    The patient's EKG shows sinus bradycardia with marked sinus arrhythmia and 1st-degree AV block.  She also has a prolonged QT interval; QTc is 0.511.    The patient is being admitted to the ICU for close observation and neurosurgical consultation.    Lab work showed a mild anemia, hemoglobin 11.3, hematocrit 31.8 with a normal MCV.  Potassium was slightly low at 3.2.  Renal functions normal.  Sodium was a little high, 146.  Troponin I was negative.  Magnesium is low normal at 1.8.  Her TSH is mildly elevated at 5.27, free T4 normal at 1.06.    PAST MEDICAL HISTORY:  1.  No known history of coronary artery disease.  Echocardiogram October 2018 shows an ejection fraction of 55% and grade 1 diastolic dysfunction.   2.  A 48-hour Holter monitor, also from October last year, showed supraventricular ectopy, mostly wandering atrial pacemaker and sinus tachycardia.  3.  Hypertension.  4.  Rheumatoid arthritis.  5.  Hemochromatosis with periodic phlebotomies.  6.  Hypothyroidism, on replacement therapy.  7.  Osteoporosis.  8.  History of diverticulitis.  9.  Multiple surgeries including left hip surgery, right hip surgery, left knee replacement, left femur fracture repaired secondary to a fall, kidney stone extraction, thyroid surgery, total abdominal hysterectomy in 1961 with subsequent bilateral salpingo-oophorectomy, 3 C-sections, partial colectomy, a negative breast biopsy on the left, a laparoscopic surgery, right shoulder surgery and right carpal tunnel surgery.    CURRENT MEDICATIONS:  Include Lasix 40 mg q. Monday, Wednesday and Friday, Plaquenil 200 mg b.i.d., Simponi Aria infusion q. 8 weeks, methotrexate 12.5 mg weekly, folic acid 1 mg p.o. daily, metoprolol 50 mg b.i.d., lisinopril 20 mg daily, levothyroxine 100 mcg daily, potassium 10 mEq daily, enteric-coated aspirin 81 mg daily, recently started on Fosamax 70 mg  weekly.  She also takes vitamin D, vitamin C, fish oil, an eye vitamin and biotin.    ALLERGIES:  SULFA AND SHELLFISH.    SOCIAL HISTORY:  She is a widow.  She has 2 sons in the area and a daughter in [place].  She does not smoke.  She does not drink.    FAMILY HISTORY:  Father died of lung cancer.  She has 2 sisters who had breast cancer.  The patient is scheduled already for a mammogram on the 19th of this month.    IMMUNIZATIONS:  The patient has received Prevnar 13, Pneumovax, flu shot every year.  She had the initial Zostavax and has had the Tdap.      REVIEW OF SYSTEMS:  The patient has a minimal headache, resolved with Tylenol.  She denies any dysphasia, dysarthria, visual changes, nausea, abdominal pain, peripheral numbness, weakness or paresthesias.  She denies any chest pain, pressure  or heaviness, shortness of breath.  She ambulates normally with the assistance of either a walker or a cane due to her multiple orthopedic surgeries.    PHYSICAL EXAMINATION:  GENERAL:  The patient is a very pleasant 84 year old female.  She is awake, alert, oriented times 3, demonstrates a good recollection of the events this morning as well as her prior medical history.  VITAL SIGNS:  Pulse 57, blood pressure 119/55, respiratory rate 26, O2 saturation 4%.    NEUROLOGIC:  Cranial nerves II-XII are intact.  Speech is fluent.  Motor strength is +5.  Sensation is normal in all 4 extremities.  Toes are downgoing bilaterally.  NECK:  There is no jugular venous distention.  No carotid bruits are auscultated.  No thyromegaly.  She has got a healed thyroid scar.  LUNGS:  Clear to auscultation in all fields without crackles, wheezes, rhonchi or dullness.  HEART:  Regular at a rate of about 60 with a grade 3 systolic murmur over the aortic area with a normal S2.  ABDOMEN:  Soft, nontender.  No hepatomegaly is appreciated.  There are no bruits or masses palpable.  EXTREMITIES:  There is no peripheral edema or cyanosis present.    SKIN:  She has a skin tear on the dorsum of the right hand from her fall this morning that is bandaged.  She also has a bandage on her head where she received 4 sutures for a laceration.    ASSESSMENT AND PLAN:  1.  Fall with head injury resulting in a small interhemispheric subdural hematoma.  Neurosurgery has already seen the patient.  She is scheduled for a repeat CT scan of the head tomorrow.  2.  Relative bradycardia with a prolonged QTc interval.  We will continue to hold the patient's metoprolol and lisinopril.  Repeat an EKG in the morning.  3.  Hypokalemia.  Replace.  4.  Hypothyroidism.  Increased from 100 to 112 mcg daily.  5.  Flowtrons for DVT prophylaxis.      ___________________  Blenda Bridegroomhomas L Jakelin Taussig DO  Dictated By: .   MLT  D:08/27/2017 19:31:05  T: 08/27/2017 19:56:53  [id number]

## 2017-08-27 NOTE — ED Triage Notes (Signed)
Pt reports she was getting out of bed this am and fell in her bedroom.  pts head hit her dresser and she has a laceration on her scalp and skin tear on her right wrist

## 2017-08-28 ENCOUNTER — Inpatient Hospital Stay: Admit: 2017-08-28 | Payer: MEDICARE | Primary: Internal Medicine

## 2017-08-28 LAB — METABOLIC PANEL, BASIC
Anion gap: 6 mmol/L (ref 5–15)
BUN: 10 mg/dl (ref 7–25)
CO2: 26 mEq/L (ref 21–32)
Calcium: 8.5 mg/dl (ref 8.5–10.1)
Chloride: 110 mEq/L — ABNORMAL HIGH (ref 98–107)
Creatinine: 0.6 mg/dl (ref 0.6–1.3)
GFR est AA: >60
GFR est non-AA: >60
Glucose: 123 mg/dl — ABNORMAL HIGH (ref 74–106)
Potassium: 3.8 mEq/L (ref 3.5–5.1)
Sodium: 142 mEq/L (ref 136–145)

## 2017-08-28 LAB — CBC WITH AUTOMATED DIFF
EOSINOPHILS: 2.9 % (ref 0–5)
HCT: 26.9 % — ABNORMAL LOW (ref 37.0–50.0)
HGB: 9.3 gm/dl — ABNORMAL LOW (ref 13.0–17.2)
LYMPHOCYTES: 27.9 % — ABNORMAL LOW (ref 28–48)
MCH: 33.9 pg (ref 25.4–34.6)
MCHC: 34.6 gm/dl (ref 30.0–36.0)
MCV: 98.2 fL — ABNORMAL HIGH (ref 80.0–98.0)
MONOCYTES: 2.9 % (ref 1–13)
MPV: 11.8 fL — ABNORMAL HIGH (ref 6.0–10.0)
NEUTROPHILS: 65.4 % — ABNORMAL HIGH (ref 34–64)
NRBC: 0 (ref 0–0)
PLASMA CELL: 1
PLATELET COMMENTS: NORMAL
PLATELET: 112 10*3/uL — ABNORMAL LOW (ref 140–450)
RBC: 2.74 M/uL — ABNORMAL LOW (ref 3.60–5.20)
RDW-SD: 47.5 — ABNORMAL HIGH (ref 36.4–46.3)
Smudge cells: 12.5
WBC: 5.2 10*3/uL (ref 4.0–11.0)

## 2017-08-28 LAB — POTASSIUM
Potassium: 3.5 mEq/L (ref 3.5–5.1)
Potassium: 3.5 mEq/L (ref 3.5–5.1)

## 2017-08-28 LAB — MAGNESIUM
Magnesium: 1.6 mg/dl (ref 1.6–2.6)
Magnesium: 1.6 mg/dl (ref 1.6–2.6)

## 2017-08-28 LAB — CBC WITH AUTO DIFFERENTIAL
Eosinophils %: 2.9 % (ref 0–5)
Hematocrit: 26.9 % — ABNORMAL LOW (ref 37.0–50.0)
Hemoglobin: 9.3 gm/dl — ABNORMAL LOW (ref 13.0–17.2)
Lymphocytes %: 27.9 % — ABNORMAL LOW (ref 28–48)
MCH: 33.9 pg (ref 25.4–34.6)
MCHC: 34.6 gm/dl (ref 30.0–36.0)
MCV: 98.2 fL — ABNORMAL HIGH (ref 80.0–98.0)
MPV: 11.8 fL — ABNORMAL HIGH (ref 6.0–10.0)
Monocytes %: 2.9 % (ref 1–13)
Neutrophils %: 65.4 % — ABNORMAL HIGH (ref 34–64)
Nucleated RBCs: 0 (ref 0–0)
PLASMA CELL, PLASMA CELL: 1
Platelet Comment: NORMAL
Platelets: 112 10*3/uL — ABNORMAL LOW (ref 140–450)
RBC: 2.74 M/uL — ABNORMAL LOW (ref 3.60–5.20)
RDW-SD: 47.5 — ABNORMAL HIGH (ref 36.4–46.3)
SMUDGE CELLS: 12.5
WBC: 5.2 10*3/uL (ref 4.0–11.0)

## 2017-08-28 LAB — BASIC METABOLIC PANEL
Anion Gap: 6 mmol/L (ref 5–15)
BUN: 10 mg/dl (ref 7–25)
CO2: 26 mEq/L (ref 21–32)
Calcium: 8.5 mg/dl (ref 8.5–10.1)
Chloride: 110 mEq/L — ABNORMAL HIGH (ref 98–107)
Creatinine: 0.6 mg/dl (ref 0.6–1.3)
EGFR IF NonAfrican American: >60
GFR African American: >60
Glucose: 123 mg/dl — ABNORMAL HIGH (ref 74–106)
Potassium: 3.8 mEq/L (ref 3.5–5.1)
Sodium: 142 mEq/L (ref 136–145)

## 2017-08-28 MED ORDER — SODIUM CHLORIDE 0.9 % IJ SYRG
Freq: Three times a day (TID) | INTRAMUSCULAR | Status: DC
Start: 2017-08-28 — End: 2017-08-31
  Administered 2017-08-28 – 2017-08-31 (×9): via INTRAVENOUS

## 2017-08-28 MED ORDER — POTASSIUM CHLORIDE SR 20 MEQ TAB, PARTICLES/CRYSTALS
20 mEq | Freq: Every day | ORAL | Status: DC
Start: 2017-08-28 — End: 2017-08-31
  Administered 2017-08-28 – 2017-08-31 (×4): via ORAL

## 2017-08-28 MED ORDER — SODIUM CHLORIDE 0.9 % IV
4 mEq/mL (50 %) | Freq: Once | INTRAVENOUS | Status: AC
Start: 2017-08-28 — End: 2017-08-28
  Administered 2017-08-28: 13:00:00 via INTRAVENOUS

## 2017-08-28 MED ORDER — SODIUM CHLORIDE 0.9 % IV
INTRAVENOUS | Status: DC
Start: 2017-08-28 — End: 2017-08-28
  Administered 2017-08-28: 04:00:00 via INTRAVENOUS

## 2017-08-28 MED FILL — ACETAMINOPHEN 325 MG TABLET: 325 mg | ORAL | Qty: 2

## 2017-08-28 MED FILL — DOCUSATE SODIUM 100 MG CAP: 100 mg | ORAL | Qty: 1

## 2017-08-28 MED FILL — MAGNESIUM SULFATE 50 % (4 MEQ/ML) INJECTION: 4 mEq/mL (50 %) | INTRAMUSCULAR | Qty: 6

## 2017-08-28 MED FILL — LEVOTHYROXINE 112 MCG TAB: 112 mcg | ORAL | Qty: 1

## 2017-08-28 MED FILL — SODIUM CHLORIDE 0.9 % IV: INTRAVENOUS | Qty: 1000

## 2017-08-28 MED FILL — POTASSIUM CHLORIDE SR 20 MEQ TAB, PARTICLES/CRYSTALS: 20 mEq | ORAL | Qty: 1

## 2017-08-28 NOTE — Progress Notes (Signed)
Neurosurgery Progress Note;  Follow up scan: no new lesions interhemispheric subdural is stable  S: doing very well. No headache but head is sore.   O: alert oriented x 3 cranial nerves are intact. Non focal examination  Afebrile and vitals are stable. Head dressing is intact.  A; doing well  P; follow up head ct tomorrow     Possible dc monday

## 2017-08-28 NOTE — Progress Notes (Signed)
 Problem: Falls - Risk of  Goal: *Absence of Falls  Description  Document Bridgette Habermann Fall Risk and appropriate interventions in the flowsheet.  Outcome: Progressing Towards Goal     Problem: Pressure Injury - Risk of  Goal: *Prevention of pressure injury  Description  Document Braden Scale and appropriate interventions in the flowsheet.  Outcome: Progressing Towards Goal

## 2017-08-28 NOTE — Consults (Signed)
[facility] Admission/Consultation Note    [name] [name] 84 y.o.  DOB: 1933/12/08  MRN: [id number]  SSN: [id number]      PCP: [name], MD    DOA: 08/27/2017     Impression:   S/p traumatic subdural from fall  Lightheadedness  Bradycardia on metoprolol 50 po bid at home  Long qt  Hemachromatosis  Hypothyroidism  Hypertension on lisinopril and metoprolol  Previous normal ef      Patient Active Problem List    Diagnosis Date Noted   ??? Subdural hematoma caused by concussion (HCC) 08/27/2017   ??? Subdural hematoma (HCC) 08/27/2017   ??? Deep venous thrombosis (HCC)          Recommendations:   Agree hold metoprolol  Repeat 12 lead with normal potassium for long qt reassessment  If any polymorphic vt magnesium iv less likely would need isuprel/dopamine  No current indication for pacemaker  Agree more tolerant higher bp as fall when getting out of bed with LH may have been due to low bp  Will follow briefly        Thank you for allowing [place] to participate in the care of this patient.    Sincerely,    Jeptha Hinnenkamp. Everlene Balls, MD  August 28, 2017, 2:03 PM      HPI:     [name] [name] is a 84 y.o. female who is being seen in cardiology consultation for bradycardia long qt.    Chief Complaint:    84 yo female s/p fall with traumatic subdural asked to see for bradycardia and long qt. She has hypertension and has been on metoprolol 50 bid. She also has been on lisinopril 20 mg daily. She has previous normal ef by 10/18 echo and a history of hemachromatosis with periodic phlebotomies. ECG shows sinus bradycardia 56 long qtc 511 first degree av block nonspecific t wave changes  in setting of hypokalemia 3.2.  Troponin 0.037  tsh 5.2  Ct head stable interhemispheric subdural hematoma      Past Medical History:   Diagnosis Date   ??? Arthritis    ??? Deep venous thrombosis (HCC)    ??? Endocrine disease     hypothyroid   ??? Hemochromatosis    ??? Hypertension      Past Surgical History:   Procedure Laterality Date   ??? HX  ORTHOPAEDIC      left knee replacement     Social History     Socioeconomic History   ??? Marital status: WIDOWED     Spouse name: Not on file   ??? Number of children: Not on file   ??? Years of education: Not on file   ??? Highest education level: Not on file   Occupational History   ??? Not on file   Social Needs   ??? Financial resource strain: Not on file   ??? Food insecurity:     Worry: Not on file     Inability: Not on file   ??? Transportation needs:     Medical: Not on file     Non-medical: Not on file   Tobacco Use   ??? Smoking status: Never Smoker   ??? Smokeless tobacco: Never Used   Substance and Sexual Activity   ??? Alcohol use: No   ??? Drug use: No   ??? Sexual activity: Not on file   Lifestyle   ??? Physical activity:     Days per week: Not on file  Minutes per session: Not on file   ??? Stress: Not on file   Relationships   ??? Social connections:     Talks on phone: Not on file     Gets together: Not on file     Attends religious service: Not on file     Active member of club or organization: Not on file     Attends meetings of clubs or organizations: Not on file     Relationship status: Not on file   ??? Intimate partner violence:     Fear of current or ex partner: Not on file     Emotionally abused: Not on file     Physically abused: Not on file     Forced sexual activity: Not on file   Other Topics Concern   ??? Not on file   Social History Narrative   ??? Not on file     History reviewed. No pertinent family history.    Allergies   Allergen Reactions   ??? Shellfish Derived Hives   ??? Sulfa (Sulfonamide Antibiotics) Hives       Home Medications:     Prior to Admission medications    Medication Sig Start Date End Date Taking? Authorizing Provider   lisinopril (PRINIVIL, ZESTRIL) 10 mg tablet Take  by mouth daily.   Yes Other, Phys, MD   levothyroxine (SYNTHROID) 125 mcg tablet Take  by mouth Daily (before breakfast).   Yes Other, Phys, MD   HYDROcodone-acetaminophen (NORCO) 5-325 mg per tablet Take 1 Tab by mouth every four (4)  hours as needed for Pain. Max Daily Amount: 6 Tabs. 07/20/14  Yes Lucio Edward, MD   predniSONE (DELTASONE) 10 mg tablet Take  by mouth daily (with breakfast).    Other, Phys, MD       Prior to Admission Medications   Prescriptions Last Dose Informant Patient Reported? Taking?   HYDROcodone-acetaminophen (NORCO) 5-325 mg per tablet   No Yes   Sig: Take 1 Tab by mouth every four (4) hours as needed for Pain. Max Daily Amount: 6 Tabs.   levothyroxine (SYNTHROID) 125 mcg tablet   Yes Yes   Sig: Take  by mouth Daily (before breakfast).   lisinopril (PRINIVIL, ZESTRIL) 10 mg tablet   Yes Yes   Sig: Take  by mouth daily.   predniSONE (DELTASONE) 10 mg tablet Not Taking at Unknown time  Yes No   Sig: Take  by mouth daily (with breakfast).      Facility-Administered Medications: None         Review of Systems:   Review of Symptoms: Please see ED notes and admission/consult notes which I have reviewed; otherwise as below.  Constitutional:  No Fever; No chills; No weight loss    Skin:  No rash; No itching   HEENT:  No changes in vision or hearing   Cardiovascular:  See HPI    Respiratory:  See HPI    Gastrointestinal:  No nausea or vomiting; No diarrhea   Genitourinary:  No dysuria; No increase in urinary frequency   Musculoskeletal:  No weakness or muscle pains   Endo:  No polyuria; no symptoms of thyroid dysfunction   Heme:  No bruising; No bleeding   Neurological:  No Seizures; No focal weakness or sensory changes   Psychiatric:  No mood changes; no suicidal ideation        Physical Examination:   HEENT: Perrla, EOMI; oral mucosa well perfused; conjunctiva not injected, sclera anicteric, no  thrush or xanthelasma  Neck: No JVD, negative carotid bruits, normal pulses; no thyromegaly, trachea midline  Resp: Clear to auscultation bilaterally; No wheezes or rales, normal effort no accessory muscle use  Cardiovascular: regular rhythm normal s1and s2; No murmurs or rubs; PMI not displaced  Carotid pulses 2+,no bruit, no  abdominal bruit, palpable femoral and DP pulses  Abd: Positive Bowel Sounds, Soft, Nontender nondistended; No hepatosplenomegaly  Ext: No clubbing, cyanosis, no edema warm and well perfused  Pulses: 2+ DP/PT/Rad  Neuro: Alert and oriented X3, Nonfocal; Muscle strength and tone symmetric, moves all 4 extremities  Skin: Warm, Dry, Intact    ECG: sr first degree av block long qt nonspecific t wave changes    Labs:    GFR: Estimated Creatinine Clearance: 67.4 mL/min (based on SCr of 0.6 mg/dL).     Lab Results   Component Value Date/Time    WBC 5.2 08/28/2017 05:19 AM    RBC 2.74 (L) 08/28/2017 05:19 AM    HGB 9.3 (L) 08/28/2017 05:19 AM    HCT 26.9 (L) 08/28/2017 05:19 AM    MCV 98.2 (Jaziah Kwasnik) 08/28/2017 05:19 AM    MCH 33.9 08/28/2017 05:19 AM    MCHC 34.6 08/28/2017 05:19 AM    RDW 13.4 05/04/2012 09:52 AM    PLT 112 (L) 08/28/2017 05:19 AM       Lab Results   Component Value Date/Time    GRANS 65.4 (Shantara Goosby) 08/28/2017 05:19 AM    LYMPH 27.9 (L) 08/28/2017 05:19 AM    MONOS 2.9 08/28/2017 05:19 AM    EOS 2.9 08/28/2017 05:19 AM    BASOS 0.4 08/27/2017 09:30 AM       Lab Results   Component Value Date    NA 142 08/28/2017    K 3.8 08/28/2017    CL 110 (Jasmia Angst) 08/28/2017    CO2 26 08/28/2017    BUN 10 08/28/2017    CREA 0.6 08/28/2017    GLU 123 (Chevi Lim) 08/28/2017    CA 8.5 08/28/2017    MG 1.6 08/28/2017        Lab Results   Component Value Date/Time    Troponin-I 0.037 08/27/2017 09:30 AM        Lab Results   Component Value Date/Time    TSH 5.270 (Priyansh Pry) 08/27/2017 01:13 PM    Free T4 1.06 08/27/2017 01:13 PM        Lab Results   Component Value Date    APTT 31.5 08/27/2017       Lab Results   Component Value Date/Time    Hemoglobin A1c 5.3 05/19/2012 03:28 PM       No results found for: CHOL, CHOLPOCT, CHOLX, CHLST, CHOLV, HDL, HDLPOC, LDL, LDLCPOC, LDLC, DLDLP, VLDLC, VLDL, TGLX, TRIGL, TRIGP, TGLPOCT, CHHD, [facility] IncCHHDX     Lab Results   Component Value Date    ALB 3.4 08/27/2017    TP 6.1 (L) 08/27/2017    TBILI 0.6 08/27/2017    ALT 15  08/27/2017    SGOT 27 08/27/2017    AP 72 08/27/2017       No results found for: SPGRU, PHU, PROTU, GLUCU, KETU, BLDU, NITU, UROU, LEUKU, WBCU, RBCU, EPSU, Gerda DissBACTU    Keano Guggenheim. Lee Mitzie Marlar, MD  August 28, 2017

## 2017-08-28 NOTE — Progress Notes (Signed)
General Daily Progress Note    NAME:  [name] MungoBarbara K [name]   DOB:   [birth year]   MRN:   [id number]     PCP:  Blenda BridegroomMauser, Thomas L, MD  Date/Time:  08/28/2017 2:26 PM    Admit Date: 08/27/2017  Hospital day: 1    Assessment / Plan:   1.SUBDURAL:  No worsening    2 PROLONGED QT:  Being addressed by Dr Mary SellaKanter    3. HYPOKALEMIA: being repleted.      LAB REVIEWED  DISCUSSED PATIENT WITH  NURSE    Recent Results (from the past 24 hour(s))   POTASSIUM    Collection Time: 08/27/17  9:08 PM   Result Value Ref Range    Potassium 3.5 3.5 - 5.1 mEq/L   CBC WITH AUTOMATED DIFF    Collection Time: 08/28/17  5:19 AM   Result Value Ref Range    WBC 5.2 4.0 - 11.0 1000/mm3    NEUTROPHILS 65.4 (H) 34 - 64 %    LYMPHOCYTES 27.9 (L) 28 - 48 %    RBC 2.74 (L) 3.60 - 5.20 M/uL    MONOCYTES 2.9 1 - 13 %    HGB 9.3 (L) 13.0 - 17.2 gm/dl    EOSINOPHILS 2.9 0 - 5 %    HCT 26.9 (L) 37.0 - 50.0 %    MCV 98.2 (H) 80.0 - 98.0 fL    MCH 33.9 25.4 - 34.6 pg    MCHC 34.6 30.0 - 36.0 gm/dl    PLATELET 295112 (L) 621140 - 450 1000/mm3    MPV 11.8 (H) 6.0 - 10.0 fL    RDW-SD 47.5 (H) 36.4 - 46.3      NRBC 0 0 - 0      Smudge cells 12.5      PLASMA CELL 1      PLATELET COMMENTS NORMAL     METABOLIC PANEL, BASIC    Collection Time: 08/28/17  5:19 AM   Result Value Ref Range    Sodium 142 136 - 145 mEq/L    Potassium 3.8 3.5 - 5.1 mEq/L    Chloride 110 (H) 98 - 107 mEq/L    CO2 26 21 - 32 mEq/L    Glucose 123 (H) 74 - 106 mg/dl    BUN 10 7 - 25 mg/dl    Creatinine 0.6 0.6 - 1.3 mg/dl    GFR est AA >30.8>60.0      GFR est non-AA >60      Calcium 8.5 8.5 - 10.1 mg/dl    Anion gap 6 5 - 15 mmol/L   MAGNESIUM    Collection Time: 08/28/17  5:19 AM   Result Value Ref Range    Magnesium 1.6 1.6 - 2.6 mg/dl       Current Facility-Administered Medications   Medication Dose Route Frequency   ??? sodium chloride (NS) flush 5-10 mL  5-10 mL IntraVENous Q8H   ??? acetaminophen (TYLENOL) tablet 650 mg  650 mg Oral Q6H PRN   ??? albuterol-ipratropium (DUO-NEB) 2.5 MG-0.5 MG/3 ML  3 mL Nebulization Q4H  PRN   ??? CALCIUM ELECTROLYTE REPLACEMENT PROTOCOL STANDARD DOSING  1 Each Other PRN   ??? POTASSIUM ELECTROLYTE REPLACEMENT PROTOCOL STANDARD DOSING  1 Each Other PRN   ??? MAGNESIUM ELECTROLYTE REPLACEMENT PROTOCOL STANDARD DOSING  1 Each Other PRN   ??? levothyroxine (SYNTHROID) tablet 112 mcg  112 mcg Oral ACB   ??? docusate sodium (COLACE) capsule 100 mg  100 mg Oral DAILY   ???  guaiFENesin-dextromethorphan (ROBITUSSIN DM) 100-10 mg/5 mL syrup 10 mL  10 mL Oral Q6H PRN   ??? potassium chloride (K-DUR, KLOR-CON) SR tablet 20 mEq  20 mEq Oral DAILY          REVIEW OF SYSTEMS:         Constitutional: negative fever, negative chills, negative weight loss          Objective:        Visit Vitals  BP 141/52   Pulse 66   Temp 98.8 ??F (37.1 ??C)   Resp 21   Ht 5\' 2"  (1.575 m)   Wt 72.6 kg (160 lb)   SpO2 93%   BMI 29.26 kg/m??       Physical Exam:   General: Appears stated age. No distress noted. Vital signs noted.  Neck: No thyromegaly, supple, no bruits  Lungs: Clear to A&P bilaterally.  Heart:: PMI not displaced, no m or gallop, rate regular  Neurologic:  Normal sensation, reflexes symmetric,no motor    Gracy Racer, MD  August 28, 2017

## 2017-08-28 NOTE — Progress Notes (Signed)
Neurosurgery Progress Note;  Follow up scan: no new lesions interhemispheric subdural is stable  S: doing very well. No headache but head is sore.   O: alert oriented x 3 cranial nerves are intact. Non focal examination  Afebrile and vitals are stable. Head dressing is intact.  A; doing well  P; follow up head ct tomorrow     Possible dc monday

## 2017-08-28 NOTE — Consults (Signed)
[facility] Admission/Consultation Note    [name] [name] 84 y.o.  DOB: 1933/10/20  MRN: [id number]  SSN: [id number]      PCP: [name], MD    DOA: 08/27/2017     Impression:   S/p traumatic subdural from fall  Lightheadedness  Bradycardia on metoprolol 50 po bid at home  Long qt  Hemachromatosis  Hypothyroidism  Hypertension on lisinopril and metoprolol  Previous normal ef      Patient Active Problem List    Diagnosis Date Noted   ??? Subdural hematoma caused by concussion (HCC) 08/27/2017   ??? Subdural hematoma (HCC) 08/27/2017   ??? Deep venous thrombosis (HCC)          Recommendations:   Agree hold metoprolol  Repeat 12 lead with normal potassium for long qt reassessment  If any polymorphic vt magnesium iv less likely would need isuprel/dopamine  No current indication for pacemaker  Agree more tolerant higher bp as fall when getting out of bed with LH may have been due to low bp  Will follow briefly        Thank you for allowing [place] to participate in the care of this patient.    Sincerely,    Seeley Hissong. Everlene Balls, MD  August 28, 2017, 2:03 PM      HPI:     [name] [name] is a 84 y.o. female who is being seen in cardiology consultation for bradycardia long qt.    Chief Complaint:    84 yo female s/p fall with traumatic subdural asked to see for bradycardia and long qt. She has hypertension and has been on metoprolol 50 bid. She also has been on lisinopril 20 mg daily. She has previous normal ef by 10/18 echo and a history of hemachromatosis with periodic phlebotomies. ECG shows sinus bradycardia 56 long qtc 511 first degree av block nonspecific t wave changes  in setting of hypokalemia 3.2.  Troponin 0.037  tsh 5.2  Ct head stable interhemispheric subdural hematoma      Past Medical History:   Diagnosis Date   ??? Arthritis    ??? Deep venous thrombosis (HCC)    ??? Endocrine disease     hypothyroid   ??? Hemochromatosis    ??? Hypertension      Past Surgical History:   Procedure Laterality Date    ??? HX ORTHOPAEDIC      left knee replacement     Social History     Socioeconomic History   ??? Marital status: WIDOWED     Spouse name: Not on file   ??? Number of children: Not on file   ??? Years of education: Not on file   ??? Highest education level: Not on file   Occupational History   ??? Not on file   Social Needs   ??? Financial resource strain: Not on file   ??? Food insecurity:     Worry: Not on file     Inability: Not on file   ??? Transportation needs:     Medical: Not on file     Non-medical: Not on file   Tobacco Use   ??? Smoking status: Never Smoker   ??? Smokeless tobacco: Never Used   Substance and Sexual Activity   ??? Alcohol use: No   ??? Drug use: No   ??? Sexual activity: Not on file   Lifestyle   ??? Physical activity:     Days per week: Not on file  Minutes per session: Not on file   ??? Stress: Not on file   Relationships   ??? Social connections:     Talks on phone: Not on file     Gets together: Not on file     Attends religious service: Not on file     Active member of club or organization: Not on file     Attends meetings of clubs or organizations: Not on file     Relationship status: Not on file   ??? Intimate partner violence:     Fear of current or ex partner: Not on file     Emotionally abused: Not on file     Physically abused: Not on file     Forced sexual activity: Not on file   Other Topics Concern   ??? Not on file   Social History Narrative   ??? Not on file     History reviewed. No pertinent family history.    Allergies   Allergen Reactions   ??? Shellfish Derived Hives   ??? Sulfa (Sulfonamide Antibiotics) Hives       Home Medications:     Prior to Admission medications    Medication Sig Start Date End Date Taking? Authorizing Provider   lisinopril (PRINIVIL, ZESTRIL) 10 mg tablet Take  by mouth daily.   Yes Other, Phys, MD   levothyroxine (SYNTHROID) 125 mcg tablet Take  by mouth Daily (before breakfast).   Yes Other, Phys, MD   HYDROcodone-acetaminophen (NORCO) 5-325 mg per tablet Take 1 Tab by mouth  every four (4) hours as needed for Pain. Max Daily Amount: 6 Tabs. 07/20/14  Yes Lucio Edward, MD   predniSONE (DELTASONE) 10 mg tablet Take  by mouth daily (with breakfast).    Other, Phys, MD       Prior to Admission Medications   Prescriptions Last Dose Informant Patient Reported? Taking?   HYDROcodone-acetaminophen (NORCO) 5-325 mg per tablet   No Yes   Sig: Take 1 Tab by mouth every four (4) hours as needed for Pain. Max Daily Amount: 6 Tabs.   levothyroxine (SYNTHROID) 125 mcg tablet   Yes Yes   Sig: Take  by mouth Daily (before breakfast).   lisinopril (PRINIVIL, ZESTRIL) 10 mg tablet   Yes Yes   Sig: Take  by mouth daily.   predniSONE (DELTASONE) 10 mg tablet Not Taking at Unknown time  Yes No   Sig: Take  by mouth daily (with breakfast).      Facility-Administered Medications: None         Review of Systems:   Review of Symptoms: Please see ED notes and admission/consult notes which I have reviewed; otherwise as below.  Constitutional:  No Fever; No chills; No weight loss    Skin:  No rash; No itching   HEENT:  No changes in vision or hearing   Cardiovascular:  See HPI    Respiratory:  See HPI    Gastrointestinal:  No nausea or vomiting; No diarrhea   Genitourinary:  No dysuria; No increase in urinary frequency   Musculoskeletal:  No weakness or muscle pains   Endo:  No polyuria; no symptoms of thyroid dysfunction   Heme:  No bruising; No bleeding   Neurological:  No Seizures; No focal weakness or sensory changes   Psychiatric:  No mood changes; no suicidal ideation        Physical Examination:   HEENT: Perrla, EOMI; oral mucosa well perfused; conjunctiva not injected, sclera anicteric, no  thrush or xanthelasma  Neck: No JVD, negative carotid bruits, normal pulses; no thyromegaly, trachea midline  Resp: Clear to auscultation bilaterally; No wheezes or rales, normal effort no accessory muscle use  Cardiovascular: regular rhythm normal s1and s2; No murmurs or rubs; PMI not displaced   Carotid pulses 2+,no bruit, no abdominal bruit, palpable femoral and DP pulses  Abd: Positive Bowel Sounds, Soft, Nontender nondistended; No hepatosplenomegaly  Ext: No clubbing, cyanosis, no edema warm and well perfused  Pulses: 2+ DP/PT/Rad  Neuro: Alert and oriented X3, Nonfocal; Muscle strength and tone symmetric, moves all 4 extremities  Skin: Warm, Dry, Intact    ECG: sr first degree av block long qt nonspecific t wave changes    Labs:    GFR: Estimated Creatinine Clearance: 67.4 mL/min (based on SCr of 0.6 mg/dL).     Lab Results   Component Value Date/Time    WBC 5.2 08/28/2017 05:19 AM    RBC 2.74 (L) 08/28/2017 05:19 AM    HGB 9.3 (L) 08/28/2017 05:19 AM    HCT 26.9 (L) 08/28/2017 05:19 AM    MCV 98.2 (Lachele Lievanos) 08/28/2017 05:19 AM    MCH 33.9 08/28/2017 05:19 AM    MCHC 34.6 08/28/2017 05:19 AM    RDW 13.4 05/04/2012 09:52 AM    PLT 112 (L) 08/28/2017 05:19 AM       Lab Results   Component Value Date/Time    GRANS 65.4 (Jonia Oakey) 08/28/2017 05:19 AM    LYMPH 27.9 (L) 08/28/2017 05:19 AM    MONOS 2.9 08/28/2017 05:19 AM    EOS 2.9 08/28/2017 05:19 AM    BASOS 0.4 08/27/2017 09:30 AM       Lab Results   Component Value Date    NA 142 08/28/2017    K 3.8 08/28/2017    CL 110 (Jeron Grahn) 08/28/2017    CO2 26 08/28/2017    BUN 10 08/28/2017    CREA 0.6 08/28/2017    GLU 123 (Adison Jerger) 08/28/2017    CA 8.5 08/28/2017    MG 1.6 08/28/2017        Lab Results   Component Value Date/Time    Troponin-I 0.037 08/27/2017 09:30 AM        Lab Results   Component Value Date/Time    TSH 5.270 (Adisyn Ruscitti) 08/27/2017 01:13 PM    Free T4 1.06 08/27/2017 01:13 PM        Lab Results   Component Value Date    APTT 31.5 08/27/2017       Lab Results   Component Value Date/Time    Hemoglobin A1c 5.3 05/19/2012 03:28 PM       No results found for: CHOL, CHOLPOCT, CHOLX, CHLST, CHOLV, HDL, HDLPOC, LDL, LDLCPOC, LDLC, DLDLP, VLDLC, VLDL, TGLX, TRIGL, TRIGP, TGLPOCT, CHHD, [facility]     Lab Results   Component Value Date    ALB 3.4 08/27/2017    TP 6.1 (L) 08/27/2017     TBILI 0.6 08/27/2017    ALT 15 08/27/2017    SGOT 27 08/27/2017    AP 72 08/27/2017       No results found for: SPGRU, PHU, PROTU, GLUCU, KETU, BLDU, NITU, UROU, LEUKU, WBCU, RBCU, EPSU, Gerda DissBACTU    Dystany Duffy. Lee Khristi Schiller, MD  August 28, 2017

## 2017-08-28 NOTE — Progress Notes (Signed)
Problem: Falls - Risk of  Goal: *Absence of Falls  Description  Document Schmid Fall Risk and appropriate interventions in the flowsheet.  Outcome: Progressing Towards Goal     Problem: Pressure Injury - Risk of  Goal: *Prevention of pressure injury  Description  Document Braden Scale and appropriate interventions in the flowsheet.  Outcome: Progressing Towards Goal

## 2017-08-28 NOTE — Progress Notes (Signed)
General Daily Progress Note    NAME:  [name] [name]   DOB:   December 26, 1933   MRN:   [id number]     PCP:  Blenda BridegroomMauser, Thomas L, MD  Date/Time:  08/28/2017 2:26 PM    Admit Date: 08/27/2017  Hospital day: 1    Assessment / Plan:   1.SUBDURAL:  No worsening    2 PROLONGED QT:  Being addressed by Dr Mary SellaKanter    3. HYPOKALEMIA: being repleted.      LAB REVIEWED  DISCUSSED PATIENT WITH  NURSE    Recent Results (from the past 24 hour(s))   POTASSIUM    Collection Time: 08/27/17  9:08 PM   Result Value Ref Range    Potassium 3.5 3.5 - 5.1 mEq/L   CBC WITH AUTOMATED DIFF    Collection Time: 08/28/17  5:19 AM   Result Value Ref Range    WBC 5.2 4.0 - 11.0 1000/mm3    NEUTROPHILS 65.4 (H) 34 - 64 %    LYMPHOCYTES 27.9 (L) 28 - 48 %    RBC 2.74 (L) 3.60 - 5.20 M/uL    MONOCYTES 2.9 1 - 13 %    HGB 9.3 (L) 13.0 - 17.2 gm/dl    EOSINOPHILS 2.9 0 - 5 %    HCT 26.9 (L) 37.0 - 50.0 %    MCV 98.2 (H) 80.0 - 98.0 fL    MCH 33.9 25.4 - 34.6 pg    MCHC 34.6 30.0 - 36.0 gm/dl    PLATELET 045112 (L) 409140 - 450 1000/mm3    MPV 11.8 (H) 6.0 - 10.0 fL    RDW-SD 47.5 (H) 36.4 - 46.3      NRBC 0 0 - 0      Smudge cells 12.5      PLASMA CELL 1      PLATELET COMMENTS NORMAL     METABOLIC PANEL, BASIC    Collection Time: 08/28/17  5:19 AM   Result Value Ref Range    Sodium 142 136 - 145 mEq/L    Potassium 3.8 3.5 - 5.1 mEq/L    Chloride 110 (H) 98 - 107 mEq/L    CO2 26 21 - 32 mEq/L    Glucose 123 (H) 74 - 106 mg/dl    BUN 10 7 - 25 mg/dl    Creatinine 0.6 0.6 - 1.3 mg/dl    GFR est AA >81.1>60.0      GFR est non-AA >60      Calcium 8.5 8.5 - 10.1 mg/dl    Anion gap 6 5 - 15 mmol/L   MAGNESIUM    Collection Time: 08/28/17  5:19 AM   Result Value Ref Range    Magnesium 1.6 1.6 - 2.6 mg/dl       Current Facility-Administered Medications   Medication Dose Route Frequency   ??? sodium chloride (NS) flush 5-10 mL  5-10 mL IntraVENous Q8H   ??? acetaminophen (TYLENOL) tablet 650 mg  650 mg Oral Q6H PRN   ??? albuterol-ipratropium (DUO-NEB) 2.5 MG-0.5 MG/3 ML  3 mL Nebulization  Q4H PRN   ??? CALCIUM ELECTROLYTE REPLACEMENT PROTOCOL STANDARD DOSING  1 Each Other PRN   ??? POTASSIUM ELECTROLYTE REPLACEMENT PROTOCOL STANDARD DOSING  1 Each Other PRN   ??? MAGNESIUM ELECTROLYTE REPLACEMENT PROTOCOL STANDARD DOSING  1 Each Other PRN   ??? levothyroxine (SYNTHROID) tablet 112 mcg  112 mcg Oral ACB   ??? docusate sodium (COLACE) capsule 100 mg  100 mg Oral DAILY   ???  guaiFENesin-dextromethorphan (ROBITUSSIN DM) 100-10 mg/5 mL syrup 10 mL  10 mL Oral Q6H PRN   ??? potassium chloride (K-DUR, KLOR-CON) SR tablet 20 mEq  20 mEq Oral DAILY          REVIEW OF SYSTEMS:         Constitutional: negative fever, negative chills, negative weight loss          Objective:        Visit Vitals  BP 141/52   Pulse 66   Temp 98.8 ??F (37.1 ??C)   Resp 21   Ht 5\' 2"  (1.575 m)   Wt 72.6 kg (160 lb)   SpO2 93%   BMI 29.26 kg/m??       Physical Exam:   General: Appears stated age. No distress noted. Vital signs noted.  Neck: No thyromegaly, supple, no bruits  Lungs: Clear to A&P bilaterally.  Heart:: PMI not displaced, no m or gallop, rate regular  Neurologic:  Normal sensation, reflexes symmetric,no motor    Gracy Racer, MD  August 28, 2017

## 2017-08-28 NOTE — Other (Signed)
Bedside and Verbal shift change report given toRose RN  (oncoming nurse) by Luvenia Starchhris Kenny RN (offgoing nurse). Report included the following information SBAR, Intake/Output, MAR, Recent Results and Cardiac Rhythm SR with first degree avb.

## 2017-08-28 NOTE — Other (Addendum)
Bedside and Verbal shift change report given to Luvenia Starchhris kenny RN (oncoming nurse) by Rogue Bussingose RN (offgoing nurse). Report included the following information SBAR, Kardex, Intake/Output, MAR, Recent Results and Cardiac Rhythm First degree avb. Neuro check verified with oncoming RN

## 2017-08-28 NOTE — Other (Deleted)
PHYSICIAN???S DOCUMENTATION REQUEST   This Form is Not a Permanent Document in the Medical Record         By submitting this query, we are merely seeking further clarification of documentation to accurately reflect all conditions that you are monitoring, evaluating, treating or that extend the hospitalization or utilize additional resources of care. Please utilize your independent clinical judgment when addressing the question(s) below.    Dear Doctor Emmit AlexandersMauser,    Documentation in H&P    Fall with head injury resulting in a small interhemispheric subdural hematoma.     PER DR. WATERS  Interhemispheric subdural hematoma, closed head injury, falls.    PER DR. CARIDEO    1.SUBDURAL:  No worsening      Diagnostic testing   CT HEAD Acute subdural hemorrhage involving the interhemispheric fissure measuring 1 cm  in thickness.    Treatment includes:   Neurosurgery consult, CT Head    .................................................................................................................................[name].    After study, please clarify if the diagnosis of Acute Subdural Hematoma is:    ?? Current (please include any additional related symptoms, monitoring and  management)  ?? Resolved (please include any additional related symptoms, monitoring and  management)  ?? Ruled Out          The words probable, likely, suspected, questionable are acceptable in the inpatient record, as long as the condition is documented as such, or confirmed or ruled out in the DC summary.        PLEASE DOCUMENT ANY ADDITIONAL DIAGNOSES AND/OR SPECIFICITY IN THE PROGRESS NOTES AND/OR DISCHARGE SUMMARY.    Thank you,  Teresa CoombsGul-Shabana Verdie Wilms MD, CCS, CCDS, cDIP  Clinical Documentation Specialist  [phone]

## 2017-08-28 NOTE — Other (Addendum)
Dr.Waters present at bedside examined pt and seen CT head No changed as per MD.    1000 family at bedside    1410 Dr. Billy Coastarideo present seen pt .    1428 Dr. Mary SellaKanter at bedside . Order received.for 12 lead ekg.    1501 ekg done

## 2017-08-29 ENCOUNTER — Inpatient Hospital Stay: Admit: 2017-08-29 | Payer: MEDICARE | Primary: Internal Medicine

## 2017-08-29 LAB — MAGNESIUM
Magnesium: 2 mg/dl (ref 1.6–2.6)
Magnesium: 2 mg/dl (ref 1.6–2.6)

## 2017-08-29 MED ORDER — TRAMADOL 50 MG TAB
50 mg | Freq: Three times a day (TID) | ORAL | Status: DC | PRN
Start: 2017-08-29 — End: 2017-08-31
  Administered 2017-08-29 – 2017-08-30 (×3): via ORAL

## 2017-08-29 MED FILL — LEVOTHYROXINE 112 MCG TAB: 112 mcg | ORAL | Qty: 1

## 2017-08-29 MED FILL — TRAMADOL 50 MG TAB: 50 mg | ORAL | Qty: 1

## 2017-08-29 MED FILL — DOCUSATE SODIUM 100 MG CAP: 100 mg | ORAL | Qty: 1

## 2017-08-29 MED FILL — BD POSIFLUSH NORMAL SALINE 0.9 % INJECTION SYRINGE: INTRAMUSCULAR | Qty: 10

## 2017-08-29 MED FILL — ACETAMINOPHEN 325 MG TABLET: 325 mg | ORAL | Qty: 2

## 2017-08-29 MED FILL — POTASSIUM CHLORIDE SR 20 MEQ TAB, PARTICLES/CRYSTALS: 20 mEq | ORAL | Qty: 1

## 2017-08-29 NOTE — Progress Notes (Signed)
General Daily Progress Note    NAME:  [name] [name]   DOB:   17-Apr-1933   MRN:   [id number]     PCP:  Blenda BridegroomMauser, Thomas L, MD  Date/Time:  08/29/2017 5:38 PM    Admit Date: 08/27/2017  Hospital day: 2    Assessment / Plan:   1. SUBDURAL:   9mm from 10mm.      2. WEAKNESS:  troubl walking ,  Will need PT and SNF???    3.  PROLONGED QT: cleared by Dr Mary SellaKanter    Transfer to floor PT    LAB REVIEWED  DISCUSSED PATIENT WITH  NURSE    Recent Results (from the past 24 hour(s))   MAGNESIUM    Collection Time: 08/29/17  3:36 AM   Result Value Ref Range    Magnesium 2.0 1.6 - 2.6 mg/dl       Current Facility-Administered Medications   Medication Dose Route Frequency   ??? sodium chloride (NS) flush 5-10 mL  5-10 mL IntraVENous Q8H   ??? acetaminophen (TYLENOL) tablet 650 mg  650 mg Oral Q6H PRN   ??? albuterol-ipratropium (DUO-NEB) 2.5 MG-0.5 MG/3 ML  3 mL Nebulization Q4H PRN   ??? CALCIUM ELECTROLYTE REPLACEMENT PROTOCOL STANDARD DOSING  1 Each Other PRN   ??? POTASSIUM ELECTROLYTE REPLACEMENT PROTOCOL STANDARD DOSING  1 Each Other PRN   ??? MAGNESIUM ELECTROLYTE REPLACEMENT PROTOCOL STANDARD DOSING  1 Each Other PRN   ??? levothyroxine (SYNTHROID) tablet 112 mcg  112 mcg Oral ACB   ??? docusate sodium (COLACE) capsule 100 mg  100 mg Oral DAILY   ??? guaiFENesin-dextromethorphan (ROBITUSSIN DM) 100-10 mg/5 mL syrup 10 mL  10 mL Oral Q6H PRN   ??? potassium chloride (K-DUR, KLOR-CON) SR tablet 20 mEq  20 mEq Oral DAILY          REVIEW OF SYSTEMS:         Constitutional: negative fever, negative chills, negative weight loss        Objective:        Visit Vitals  BP 130/67   Pulse 80   Temp 99.3 ??F (37.4 ??C)   Resp 22   Ht 5\' 2"  (1.575 m)   Wt 72.6 kg (160 lb)   SpO2 93%   BMI 29.26 kg/m??       Physical Exam:   General: Appears stated age. No distress noted. Vital signs noted.  Lungs: Clear to A&P bilaterally.  Heart:: PMI not displaced, no m or gallop, rate regular  Abdomen: Soft, non-tender. No organomegaly.  Extremities: No cyanosis or edema, stands  with assistance.      Gracy RacerLouis Karriem Muench, MD  August 29, 2017

## 2017-08-29 NOTE — Progress Notes (Signed)
Neurosurgery Progress NOte;  Today's head ct scan: some decrease in size of subdural  Disposition: per patient daughter who is visiting will stay for a week or two  Afebrile and vitals are stable. Alert oriented x 3 no focal examination  A; doing well  Rec: okay to go to the floor, home today or tomorrow.   Should have follow up visit in 1-2 weeks with [id number]  Will sign off today thank you

## 2017-08-29 NOTE — Progress Notes (Signed)
Card followup  No formal visit or charge  ecg reviewed 08/28/17 sr pacs rate 71 QTC 471 still looks long  No offending drugs  maG 2.0  Potassium 3.8  No arrhythmias  I won't follow actively available if needed  Joaquim LaiH Lee Telma Pyeatt, MD  [facility]  Cell/beeper [phone]  Office [phone]  Fax [phone]

## 2017-08-29 NOTE — Progress Notes (Signed)
General Daily Progress Note    NAME:  [name] [name]   DOB:   11/13/1933   MRN:   [id number]     PCP:  Blenda BridegroomMauser, Thomas L, MD  Date/Time:  08/29/2017 5:38 PM    Admit Date: 08/27/2017  Hospital day: 2    Assessment / Plan:   1. SUBDURAL:   9mm from 10mm.      2. WEAKNESS:  troubl walking ,  Will need PT and SNF???    3.  PROLONGED QT: cleared by Dr Mary SellaKanter    Transfer to floor PT    LAB REVIEWED  DISCUSSED PATIENT WITH  NURSE    Recent Results (from the past 24 hour(s))   MAGNESIUM    Collection Time: 08/29/17  3:36 AM   Result Value Ref Range    Magnesium 2.0 1.6 - 2.6 mg/dl       Current Facility-Administered Medications   Medication Dose Route Frequency   ??? sodium chloride (NS) flush 5-10 mL  5-10 mL IntraVENous Q8H   ??? acetaminophen (TYLENOL) tablet 650 mg  650 mg Oral Q6H PRN   ??? albuterol-ipratropium (DUO-NEB) 2.5 MG-0.5 MG/3 ML  3 mL Nebulization Q4H PRN   ??? CALCIUM ELECTROLYTE REPLACEMENT PROTOCOL STANDARD DOSING  1 Each Other PRN   ??? POTASSIUM ELECTROLYTE REPLACEMENT PROTOCOL STANDARD DOSING  1 Each Other PRN   ??? MAGNESIUM ELECTROLYTE REPLACEMENT PROTOCOL STANDARD DOSING  1 Each Other PRN   ??? levothyroxine (SYNTHROID) tablet 112 mcg  112 mcg Oral ACB   ??? docusate sodium (COLACE) capsule 100 mg  100 mg Oral DAILY   ??? guaiFENesin-dextromethorphan (ROBITUSSIN DM) 100-10 mg/5 mL syrup 10 mL  10 mL Oral Q6H PRN   ??? potassium chloride (K-DUR, KLOR-CON) SR tablet 20 mEq  20 mEq Oral DAILY          REVIEW OF SYSTEMS:         Constitutional: negative fever, negative chills, negative weight loss        Objective:        Visit Vitals  BP 130/67   Pulse 80   Temp 99.3 ??F (37.4 ??C)   Resp 22   Ht 5\' 2"  (1.575 m)   Wt 72.6 kg (160 lb)   SpO2 93%   BMI 29.26 kg/m??       Physical Exam:   General: Appears stated age. No distress noted. Vital signs noted.  Lungs: Clear to A&P bilaterally.  Heart:: PMI not displaced, no m or gallop, rate regular  Abdomen: Soft, non-tender. No organomegaly.   Extremities: No cyanosis or edema, stands with assistance.      Gracy RacerLouis Broderick Fonseca, MD  August 29, 2017

## 2017-08-29 NOTE — Progress Notes (Signed)
Neurosurgery Progress NOte;  Today's head ct scan: some decrease in size of subdural  Disposition: per patient daughter who is visiting will stay for a week or two  Afebrile and vitals are stable. Alert oriented x 3 no focal examination  A; doing well  Rec: okay to go to the floor, home today or tomorrow.   Should have follow up visit in 1-2 weeks with [id number]  Will sign off today thank you

## 2017-08-29 NOTE — Progress Notes (Signed)
Card followup  No formal visit or charge  ecg reviewed 08/28/17 sr pacs rate 71 QTC 471 still looks long  No offending drugs  maG 2.0  Potassium 3.8  No arrhythmias  I won't follow actively available if needed  Nerida Boivin Lee Kalynn Declercq, MD  [facility]  Cell/beeper [phone]  Office [phone]  Fax [phone]

## 2017-08-30 ENCOUNTER — Inpatient Hospital Stay: Admit: 2017-08-30 | Payer: MEDICARE | Primary: Internal Medicine

## 2017-08-30 LAB — EKG, 12 LEAD, INITIAL
Atrial Rate: 71 {beats}/min
Calculated R Axis: -27 degrees
Calculated T Axis: -11 degrees
Diagnosis: NORMAL
Q-T Interval: 434 ms
QRS Duration: 86 ms
QTC Calculation (Bezet): 471 ms
Ventricular Rate: 71 {beats}/min

## 2017-08-30 LAB — METABOLIC PANEL, BASIC
Anion gap: 9 mmol/L (ref 5–15)
BUN: 6 mg/dl — ABNORMAL LOW (ref 7–25)
CO2: 22 mEq/L (ref 21–32)
Calcium: 8.9 mg/dl (ref 8.5–10.1)
Chloride: 105 mEq/L (ref 98–107)
Creatinine: 0.5 mg/dl — ABNORMAL LOW (ref 0.6–1.3)
GFR est AA: >60
GFR est non-AA: >60
Glucose: 125 mg/dl — ABNORMAL HIGH (ref 74–106)
Potassium: 4 mEq/L (ref 3.5–5.1)
Sodium: 137 mEq/L (ref 136–145)

## 2017-08-30 LAB — BASIC METABOLIC PANEL
Anion Gap: 9 mmol/L (ref 5–15)
BUN: 6 mg/dl — ABNORMAL LOW (ref 7–25)
CO2: 22 mEq/L (ref 21–32)
Calcium: 8.9 mg/dl (ref 8.5–10.1)
Chloride: 105 mEq/L (ref 98–107)
Creatinine: 0.5 mg/dl — ABNORMAL LOW (ref 0.6–1.3)
EGFR IF NonAfrican American: >60
GFR African American: >60
Glucose: 125 mg/dl — ABNORMAL HIGH (ref 74–106)
Potassium: 4 mEq/L (ref 3.5–5.1)
Sodium: 137 mEq/L (ref 136–145)

## 2017-08-30 LAB — EKG 12-LEAD
Atrial Rate: 71 {beats}/min
Diagnosis: NORMAL
Q-T Interval: 434 ms
QRS Duration: 86 ms
QTc Calculation (Bazett): 471 ms
R Axis: -27 degrees
T Axis: -11 degrees
Ventricular Rate: 71 {beats}/min

## 2017-08-30 MED ORDER — HYDROXYCHLOROQUINE 200 MG TAB
200 mg | Freq: Two times a day (BID) | ORAL | Status: DC
Start: 2017-08-30 — End: 2017-08-31
  Administered 2017-08-31 (×2): via ORAL

## 2017-08-30 MED ORDER — OXYCODONE-ACETAMINOPHEN 5 MG-325 MG TAB
5-325 mg | Freq: Four times a day (QID) | ORAL | Status: DC | PRN
Start: 2017-08-30 — End: 2017-08-31
  Administered 2017-08-30 – 2017-08-31 (×3): via ORAL

## 2017-08-30 MED ORDER — ONDANSETRON (PF) 4 MG/2 ML INJECTION
4 mg/2 mL | Freq: Four times a day (QID) | INTRAMUSCULAR | Status: DC | PRN
Start: 2017-08-30 — End: 2017-08-31

## 2017-08-30 MED ORDER — LISINOPRIL 5 MG TAB
5 mg | Freq: Every day | ORAL | Status: DC
Start: 2017-08-30 — End: 2017-08-31
  Administered 2017-08-31: 14:00:00 via ORAL

## 2017-08-30 MED ORDER — MORPHINE 2 MG/ML INJECTION
2 mg/mL | INTRAMUSCULAR | Status: DC | PRN
Start: 2017-08-30 — End: 2017-08-30
  Administered 2017-08-30: 14:00:00 via INTRAVENOUS

## 2017-08-30 MED ORDER — METHOTREXATE SODIUM 2.5 MG TAB
2.5 mg | ORAL | Status: DC
Start: 2017-08-30 — End: 2017-08-31

## 2017-08-30 MED FILL — BD POSIFLUSH NORMAL SALINE 0.9 % INJECTION SYRINGE: INTRAMUSCULAR | Qty: 20

## 2017-08-30 MED FILL — POTASSIUM CHLORIDE SR 20 MEQ TAB, PARTICLES/CRYSTALS: 20 mEq | ORAL | Qty: 1

## 2017-08-30 MED FILL — ACETAMINOPHEN 325 MG TABLET: 325 mg | ORAL | Qty: 2

## 2017-08-30 MED FILL — BD POSIFLUSH NORMAL SALINE 0.9 % INJECTION SYRINGE: INTRAMUSCULAR | Qty: 30

## 2017-08-30 MED FILL — DOCUSATE SODIUM 100 MG CAP: 100 mg | ORAL | Qty: 1

## 2017-08-30 MED FILL — MORPHINE 2 MG/ML INJECTION: 2 mg/mL | INTRAMUSCULAR | Qty: 1

## 2017-08-30 MED FILL — TRAMADOL 50 MG TAB: 50 mg | ORAL | Qty: 1

## 2017-08-30 MED FILL — LEVOTHYROXINE 112 MCG TAB: 112 mcg | ORAL | Qty: 1

## 2017-08-30 MED FILL — OXYCODONE-ACETAMINOPHEN 5 MG-325 MG TAB: 5-325 mg | ORAL | Qty: 1

## 2017-08-30 NOTE — Progress Notes (Signed)
 OCCUPATIONAL THERAPY EVALUATION     Patient: [name] [name] (84 y.o. female)  Room: I409/I409    Date: 08/30/2017  Start Time: 1511        End Time:  1533    Primary Diagnosis: Subdural hematoma caused by concussion (HCC) [S06.5X9A]  Subdural hematoma (HCC) [S06.5X9A]         Precautions:  Falls.     Ordered Weight Bearing Status: None      ASSESSMENT :  Based on the objective data described below, the patient presents with   Decreased Strength  Decreased ADL/Functional Activities  Decreased Transfer Abilities  Decreased Ambulation Ability/Technique  Decreased Balance  Decreased Activity Tolerance  Increased Fatigue  Decreased Flexibility/Joint Mobility  Decreased Skin Integrity/Hygeine    Patient will benefit from skilled occupational therapy intervention to address the above impairments.     Patient's rehabilitation potential is considered to be Excellent        PLAN :  Planned Interventions: Adaptive equipment, ADI training, activity tolerance, functional balance training, functional mobility training, therapeutic exercise, therapeutic activity, patient/caregiver education and training, home exercise program and modalities as needed.  Frequency/Duration: Patient will be followed by occupational  therapy Continue OT POC, 1-5x/week x 2   weeks to address goals.  Discharge Recommendations: SNF.  Further Equipment Recommendations for Discharge: To be determined.     Please refer to Patient Education and Care Plan sections of chart for additional details.  (Patient and/or family have participated as able in goal setting and plan of care.)  EDUCATION:     Barriers to Learning/Limitations:  yes;  emotional, physical and altered mental status (i.e.Sedation, Confusion)  Education provided patient and child(ren) on  Role of OT, Call for assistance, Out of bed 2-3 times/day, Staff assistance with mobility, Changes positions frequently, Sit out of bed for 45-60 minutes or as tolerated, ADL training, Adaptive equipment use,  Bathroom safety, Home safety, Energy conservation, HEP, Safety, Functional mobility, Demonstrates adequately, Verbalized understanding, Reinforce needed, Teaching method, Verbal and Written  Educational Handouts issued:   AROM ex., Adaptive equipment, Home safety and Bathroom safety    SUBJECTIVE     Patient reports,  I was living on my own; Dr. Ananias said I can't anymore.  I need to go to Assistive Living.  I will go to rehab to get stronger so I do well at assistive living.     I cut up my hand when I fell  Bandage on R dominant hand      OBJECTIVE DATA SUMMARY   Orders, labs, and chart reviewed on [name] [name]. Discussed with Rose/Jo RN    Patient was admitted to the hospital on 08/27/2017 with   Chief Complaint   Patient presents with   . Fall   . Laceration     Present illness history:   Patient Active Problem List    Diagnosis Date Noted   . Subdural hematoma caused by concussion (HCC) 08/27/2017   . Subdural hematoma (HCC) 08/27/2017   . Deep venous thrombosis (HCC)       Previous medical history:   Past Medical History:   Diagnosis Date   . Arthritis    . Deep venous thrombosis (HCC)    . Endocrine disease     hypothyroid   . Hemochromatosis    . Hypertension        Patient found: chair, (+) telemetry, (+) oxygen, (+) IV, (+) family: daughter present      Pain Assessment: 5/10  Pain  Location:  Headache pain; wearing ice pack on head upon arrival      PRIOR LEVEL OF FUNCTION / LIVING SITUATION     Information was obtained by:   patient  Home environment:   Lives alone, 2 story, Bedroom 1st floor, Condo/Duplex, Entrance steps 1, Steps inside home 12-14 rarely goes up 1-2 x/year    Assistance available following hospital stay:  No: sons live local;  Daughter lives in [place] ; minimal support locally  Prior level of function: Retired, drove, independent with ADLs, Ambulates with device, community ambulatory, house hold ambulatory and Entrance stepsuses SPC home and community distances  Prior level of  Instrumental Activities of Daily Living:   Independent with, preparing full meals, preparing light meals/snack and hires housekeeper to clean home;  has walk in tub/jacuzzi and walk in shower with bench  Home equipment: Cane, Grab bars, elevated toilet, walk in tub/jacuzzi, walk in shower with grab bars and hand held shower head    COGNITIVE STATUS     Hearing:   grossly intact  Vision:   grossly intact and reports her opthamologist did a light dusting of her eyes, clearer vision  Patient is alert, oriented x3 with no cognitive deficits    ACTIVITIES OF DAILY LIVING STATUS     Based on direct observation, simulation and clinical assessment.    Eating -  set-up  Grooming -  min. assist and comb/brush hair  UB Bathing -  min. assist, sponge bath and recommend long handled sponge  LB Bathing -  min. assist, sponge bath and use long handled sponge  UB Dressing -  min. assist, set-up, hospital gown, robe and due to IV/tubing  LB Dressing -  min. assist, socks and shoes  Toileting -  NT    FUNCTIONAL MOBILITY and BALANCE STATUS     Mobility:  Sit to Stand -  min. assist  Stand to Sit -  min. assist        Balance:  Static Sitting Balance -  good  Dynamic Sitting Balance -  good-  Static Standing Balance -  fair  Dynamic Standing Balance -  fair- and w/ assistive device    Activity Tolerance: fair.    UPPER EXTREMITY STATUS   Patient has h/o RA;  Limitations with full fist closure bilaterally;  Patient is able to perform 2 pt and lateral pinch so she is able to perform fasteners and has AE in kitchen to assist with jars/lids/containers  Right ROM/Strength:   AROM, WFL, limitations, 25% ROM, 3+/5 and h/o RA;  limitations but functional for ADL's  Left ROM/Strength:   AROM, WFL, limitations, 25% ROM, 50% ROM, 3+/5 and h/o RA;  limitations but functional for ADL's  Right Coordination:   Impaired   Left Coordination:   Impaired   Dominance:   right  Affeccted Extremity:    Bilateral     Patient also has a h/o surgeries with  LE's ; including:  B LE weakness, especially at L LE.  Pt with history of two L hip replacements, one R hip replacement, L knee replacement and L femur surgery due to fracture several years ago.  Pt also stated 3 falls in the last 6 months.      Final position: Seated in bed side chair, all needs within reach, agrees to call for assistance, (+) ice to head, nursing staff notified, family present   Deloris Fall Risk: Total Score: 4 (08/29/17 2000)    Evaluation Complexity: History: MEDIUM Complexity :  Expanded review of history including physical, cognitive and psychosocial  history ; Examination: MEDIUM Complexity : 3-5 performance deficits relating to physical, cognitive , or psychosocial skils that result in activity limitations and / or participation restrictions; Decision Making:MEDIUM Complexity : Patient may present with comorbidities that affect occupational performnce. Miniml to moderate modification of tasks or assistance (eg, physical or verbal ) with assesment(s) is necessary to enable patient to complete evaluation   Thank you for this referral.    Jennifer M Kavar, OTR/L    TREATMENT     Patient received / participated in 25 minutes of treatment immediately following evaluation and/or educational instruction during evaluation.    OBJECTIVE: Treatment focus following IE was on ADL's, EC/WS, safety, bathroom safety and DME    ASSESSMENT: Patient/daughter report that due to patients recurrent falls, the family , her doctor and patient have agreed that patient would be better off going to Assistive Living.  Patient does agree and is requesting rehab to maximize independence and safety before going to Assistive Living.  Patient is performing well with ADl's and UE's; primary concern is LE weakness and instability in standing.  Patient has had falls in past six months.  Sock aid issued this date.  Patient has h/o RA; limited fist closure but despite limitations is quite saavy with don buttons/zippers due to  appropriate 2 pt and lateral pinch; patient unable to complete a full fist closure but accommodates well.  Patient does have difficulty with cross 4 position and donning socks and shoes.  Instruction provided with use of sock aid and reacher.  Patient already has reacher so therapist demonstrated how to use to don underwear/pants and to  Hold shoe to make it easier to get toes into shoe and use long handled shoehorn (another item patient has at home.)  Energy Conservation:  Patient was instructed in the following energy conservation principles;  1. Re-arranging your environment  2. Eliminating unnecessary effort  3. Planning ahead 4. Prioritizing  5. Breathing - purse lip.  Examples were given for basic ADLS and IADLs.   Handout provided.      PLAN: Continue OT POC, 1-5x/week      Jennifer M Kavar, OTR/L

## 2017-08-30 NOTE — Progress Notes (Signed)
physical Therapy EVALUATION    Patient: [name] [name] (84 y.o. female)  Room: I409/I409    Date: 08/30/2017  Start Time:  14:29  End Time:  15:00    Primary Diagnosis: Subdural hematoma caused by concussion (HCC) [S06.5X9A]  Subdural hematoma (HCC) [S06.5X9A]         Precautions: Falls.    Orders reviewed, chart reviewed, discussed with patient's RN and initial evaluation completed on [name] [name].    ASSESSMENT :  Based on the objective data described below, the patient presents with     Decreased Strength  Decreased ADL/Functional Activities  Decreased Transfer Abilities  Decreased Ambulation Ability/Technique  Decreased Balance  Increased Pain  Decreased Activity Tolerance  Increased Fatigue.    Patient will benefit from skilled intervention to address the above impairments.  Patient's rehabilitation potential is considered to be Good    Pt with increased headache, but reported it is feeling better after taking pain meds.  Pt demo with B LE weakness, especially at L LE.  Pt with history of two L hip replacements, one R hip replacement, L knee replacement and L femur surgery due to fracture several years ago.  Pt also stated 3 falls in the last 6 months.  Pt ambulated 40' with walker and min assist, presenting with antalgic gait with decreased swing phase at L LE.        PLAN :  Planned Interventions:  Functional mobility training Gait Training Balance Training Therapeutic exercises Therapeutic activities Neuro muscular re-education AD training Patient/caregiver education  To increase strength to allow a return to a pre-morbid status of pain free activities of daily living  Frequency/Duration: Patient will be followed by physical therapy 3x / Week and 5x / Week for 2 weeks to address goals.    PT Goals:    - Patientwillbeindependentwithbed mobility and independent withtransfers in preparation for OOB activities and ambulation.  - Patient willambulate atleast100 feet with CGAwithleast restrictive  deviceto promoteroomambulation.  - Patient will be able to sit in a chair for at least 45-90 min for respiratory hygiene.  - Patient will demo with increased dynamic balance to at least level"good" to indicate decreased risk for falls.  - Patient will demo with increased hip flex bilaterally to at least 4/5 to increase gait biomechanics.  - Patient will be independent with HEP to facilitate recovery.      Recommendations:  Physical Therapy and Occupational Therapy  Discharge Recommendations: SNF  Further Equipment Recommendations for Discharge: TBD at rehab facility.        SUBJECTIVE:   Patient: pt reported before she fell she was feeling dizzy and she lowered her self down near her dresser, but she hit her head on the chest and it was bleeding. Pt stated her daughter was with her that day.     OBJECTIVE DATA SUMMARY:   Present illness history:   Problem List  Never Reviewed          Codes Class Noted    Deep venous thrombosis (HCC) ICD-10-CM: I82.409  ICD-9-CM: 453.40  Unknown        Subdural hematoma caused by concussion [facility]) ICD-10-CM: Y78.2N5A  ICD-9-CM: 852.29  08/27/2017        Subdural hematoma (HCC) ICD-10-CM: O13.0Q6V  ICD-9-CM: 432.1  08/27/2017             Past Medical history:   Past Medical History:   Diagnosis Date   . Arthritis    . Deep venous thrombosis (HCC)    .  Endocrine disease     hypothyroid   . Hemochromatosis    . Hypertension        Prior Level of Function/Home Situation:   Home environment: Lives alone, [address], 2 story, Bedroom 1st floor, Entrance steps : 1 step.  Prior level of function: Ambulates with cane when outside, sometimes uses it at home as well.  Prior level of Activities of Daily Living: Independent with all ADLs.  Home equipment: rolling walker, rollator and straight cane      Patient found: Bed, Telemetry, ICU/stepdown equip, Oxygen and IV. Daughter arrived during PT session.     Pain Assessment before PT session: 6/10  Pain Location:  headache  Pain Assessment after PT  session: Not rated, but feels better   Pain Location:  headache  [x]           Yes, patient had pain medications  []           No, Patient has not had pain medications  [x]           Nurse notified    COGNITIVE STATUS:     Mental Status: Oriented x3 and pleasant.  Communication: grossly intact.  Follows commands: 1 step and 2 step.  General Cognition: no deficits.  Hearing: grossly intact.  Vision:  grossly intact.      EXTREMITIES ASSESSMENT:      Tone & Sensation:   Intact sensation to light touch at B LE    Strength:    R LE grossly 4-/5  L LE grossly 3/5    Range Of Motion:  R LE AROM WFL  L knee flex limited by ~50% with AROM/PROM    Functional mobility and balance status:     Sit to Stand -  min. assist, increased time and assistive devices: walker  Stand to Sit -  min. assist, walker      Balance:   Static Sitting Balance -  good  Dynamic Sitting Balance -  good  Static Standing Balance -  fair+ and fair  Dynamic Standing Balance -  fair      Ambulation/Gait Training:  steady, reciprocal, decreased cadence, decreased step height/length, antalgic and difficulty/unable to weight shift Walker, gait belt, min assist, followed by chiar  40'  Decreased swing phase at L LE, no c/o increased pain during ambulation.     Stair Training:  not tested      Therapeutic Exercises:    Patient received/participated in 20 minutes of treatment (therapeutic exercises/activities) immediately following evaluation and/or educational instruction during/immediately following PT evaluation.    Lower Extremities:  Supine, Seated, Glut Set, Quad Set, Heel Slide, Long arc quad, Straight leg raise, Ankle pumps, BLE and 10 reps      Activity Tolerance:   good- and fair+    Final Location:   bedside chair, all needs close, agrees to call for assistance, nurse notified , family present and oxygen    COMMUNICATION/EDUCATION:   Education: Patient, Daughter, Benefit of activity while hospitalized, Call for assistance, Out of bed 2-3 times/day,  Staff assistance with mobility, Changes positions frequently, Adaptive equipment use, Safety, Functional mobility and Verbalized understanding, PT role and POC.  Barriers to Learning/Limitations: None    Please refer to care plan and patient education section for further details.    Thank you for this referral.     Olena Ivanova/PT   Pager: [phone]

## 2017-08-30 NOTE — Progress Notes (Signed)
Upon arrival to unit patient had one episode of emesis. Pt stated that the movement from the wheelchair to bed caused her to feel nauseous. Pt currently resting in bed with family at bedside. Dr. Billy Coast notified and orders received for IV Zofran 4mg   Q6 PRN. Will continue to monitor.

## 2017-08-30 NOTE — Progress Notes (Signed)
 [facility] ICU Nursing Progress Note  08/29/17    06/16 1901 - 06/17 0700  In: 120 [P.O.:120]  Out: 400 [Urine:400]                                                                                          Intake/Output Summary (Last 24 hours) at 08/30/2017 0639  Last data filed at 08/30/2017 0130  Gross per 24 hour   Intake 120 ml   Output 1150 ml   Net -1030 ml        Last 3 Recorded Weights in this Encounter    08/27/17 0801   Weight: 72.6 kg (160 lb)       No results found for this or any previous visit (from the past 24 hour(s)).      Overnight Shift Events:  1935:   Bedside and verbal shift change report received from [place], Mining engineer.  Provider's instructions/meds/plan for current shift reviewed with pt by this Clinical research associate; ongoing education rendered at this time.     Pt lying in bed, call bell within reach, bed in lowest position, side rails up x3 & bed alarm engaged for pt safety; will continue to monitor.     0000:   No changes from initial assessment; see flowsheets.     0400:   No changes from initial assessment; see flowsheets.     0700:   Bedside shift change report given to [employer] and instrumentation engineer) by Camelia Chow, RN (offgoing nurse). Report included the following information SBAR, Kardex, Procedure Summary, Intake/Output, MAR, Recent Results and Cardiac Rhythm SA 1st degree AVB prolonged QT.       Date 08/29/17 0700 - 08/30/17 0659 08/30/17 0700 - 08/31/17 0659   Shift 0700-1859 1900-0659 24 Hour Total 0700-1859 1900-0659 24 Hour Total   INTAKE   P.O.  120 120        P.O.  120 120      Shift Total(mL/kg)  120(1.7) 120(1.7)      OUTPUT   Urine(mL/kg/hr) 750(0.9) 400 1150        Urine Voided [phone]      Shift Total(mL/kg) 750(10.3) 400(5.5) 1150(15.8)      NET -750 -280 -1030      Weight (kg) 72.6 72.6 72.6 72.6 72.6 72.6

## 2017-08-30 NOTE — Progress Notes (Signed)
 Problem: Falls - Risk of  Goal: *Absence of Falls  Description  Document [name] [name] Fall Risk and appropriate interventions in the flowsheet.  Outcome: Progressing Towards Goal     Problem: Pressure Injury - Risk of  Goal: *Prevention of pressure injury  Description  Document Braden Scale and appropriate interventions in the flowsheet.  Outcome: Progressing Towards Goal

## 2017-08-30 NOTE — Progress Notes (Signed)
General Daily Progress Note    NAME:  [name] [name]   DOB:   [birth year]   MRN:   [id number]     PCP:  Blenda BridegroomMauser, Thomas L, MD  Date/Time:  08/30/2017 4:53 PM    Admit Date: 08/27/2017  Hospital day: 3    Assessment / Plan:   1. ACUTE SUBDURAL:  Severe headache today prompting repeat CT. Reading was increase in size by 1mm  Though yesterday reading was decrease by nmm.    2. HEADACHE: seems to have abated with narcotic which I will use with caution with falling    3. HYPERTENTION:  Will reinstute lower dose of lisinopril without metoprolol.      LAB REVIEWED  DISCUSSED PATIENT WITH  NURSE    No results found for this or any previous visit (from the past 24 hour(s)).    Current Facility-Administered Medications   Medication Dose Route Frequency   ??? oxyCODONE-acetaminophen (PERCOCET) 5-325 mg per tablet 1 Tab  1 Tab Oral Q6H PRN   ??? traMADol (ULTRAM) tablet 50 mg  50 mg Oral Q8H PRN   ??? sodium chloride (NS) flush 5-10 mL  5-10 mL IntraVENous Q8H   ??? acetaminophen (TYLENOL) tablet 650 mg  650 mg Oral Q6H PRN   ??? albuterol-ipratropium (DUO-NEB) 2.5 MG-0.5 MG/3 ML  3 mL Nebulization Q4H PRN   ??? CALCIUM ELECTROLYTE REPLACEMENT PROTOCOL STANDARD DOSING  1 Each Other PRN   ??? POTASSIUM ELECTROLYTE REPLACEMENT PROTOCOL STANDARD DOSING  1 Each Other PRN   ??? MAGNESIUM ELECTROLYTE REPLACEMENT PROTOCOL STANDARD DOSING  1 Each Other PRN   ??? levothyroxine (SYNTHROID) tablet 112 mcg  112 mcg Oral ACB   ??? docusate sodium (COLACE) capsule 100 mg  100 mg Oral DAILY   ??? guaiFENesin-dextromethorphan (ROBITUSSIN DM) 100-10 mg/5 mL syrup 10 mL  10 mL Oral Q6H PRN   ??? potassium chloride (K-DUR, KLOR-CON) SR tablet 20 mEq  20 mEq Oral DAILY          REVIEW OF SYSTEMS:    HEENT:  Headache       Objective:        Visit Vitals  BP 138/71   Pulse 83   Temp 98.9 ??F (37.2 ??C)   Resp 18   Ht 5\' 2"  (1.575 m)   Wt 72.6 kg (160 lb)   SpO2 97%   BMI 29.26 kg/m??       Physical Exam:   General: Appears stated age. No distress noted. Vital signs noted.  Neck: No  thyromegaly, supple, no bruits  Lungs: Clear to A&P bilaterally.  Heart:: PMI not displaced, no m or gallop, rate irregular (wandering atrial pacer)    Gracy RacerLouis Charlissa Petros, MD  August 30, 2017

## 2017-08-30 NOTE — Discharge Summary (Addendum)
Discharge Summary signed by Rayetta Humphrey, MD at 08/31/17 1520                 Author: Rayetta Humphrey, MD  Service: Internal Medicine  Author Type: Physician       Filed: 08/31/17 1520  Date of Service: 08/30/17 2135  Status: Addendum          Editor: Rayetta Humphrey, MD (Physician)          Related Notes: Original Note by Gracy Racer, MD (Physician) filed at 08/30/17 [phone]               [facility]   Transfer Summary    NAME:  [name] [name]   SEX:   F   ADMIT: 08/27/2017   DISCH:    DOB:   Mar 26, 1933   MR#    [id number]   ACCT#  [id number]            DATE OF ADMISSION:    08/27/2017      DATE OF DISCHARGE:    08/31/2017      ADMITTING PHYSICIAN:   Caralyn Guile, DO      DISCHARGING PHYSICIAN:   Gracy Racer, MD       SUMMARY OF HISTORY:   This 84 year old female with a history of rheumatoid arthritis, hypertension, hemochromatosis, and hypothyroidism fell this morning after getting out of bed.  She struck her head on the dresser and eased herself down to the floor.  She had considerable  bleeding.  She was brought to the emergency room.  In the emergency room, she was found to be normotensive but a CAT scan showed a 1 cm subdural hematoma, interhemispheric, and CT of the neck was negative for fracture.  Chest x-ray was negative.  X-ray  of the hand was negative.  The patient was admitted to the ICU for close observation and neurosurgical intervention.      PAST HISTORY:   1.  No known history of heart disease.  Echo in 2018 showed EF of 55%, grade 1 diastolic dysfunction.   2.  A 48-hour Holter monitor also from 12/2016 showed supraventricular ectopy, mostly wandering, atrial pacemaker, and sinus tachycardia.   3.  Hypertension.   4.  Rheumatoid arthritis.     5.  Hemochromatosis with periodic phlebotomies.   6.  Hypothyroidism, on replacement therapy.   7.  Osteoporosis.   8.  Diverticulitis by history.   9.  Multiple surgeries including left hip surgery, right hip  surgery, left knee replacement, left femur fracture repair secondary to fall, kidney stone extraction, thyroid surgery, total abdominal hysterectomy with bilateral salpingo-oophorectomy, 3 C-sections,  partial colectomy, negative breast biopsy on the left, right shoulder surgery, and right carpal tunnel surgery.      PHYSICAL ON ADMISSION:     Remarkable for a blood pressure of 119/55, pulse 57, respiratory rate 26, O2 sat _____ 0210.  Examination was otherwise remarkable for intact neurological exam with fluent speech and motor strength normal.  Lungs clear to auscultation and percussion.   Cardiac exam showed grade 3 systolic murmur over the aortic area with a normal S2.  There is a skin tear on the dorsum of the right hand.  There is a bandage on her head where she received sutures for a laceration.      HOSPITAL COURSE AND COMPLICATIONS:   1.  Subdural hematoma.  This patient had a 1 cm subdural hematoma.  It was monitored in the ICU  over several days.  It waxed and waned in intensity by 1 mm, which was thought in the margin error of measuring it.  Dr. Byrd Hesselbach saw the patient from neurosurgery.   She was transferred to the floor on the 17th.  Physical therapy was begun.  She will be considered for discharge on the 18th to a skilled nursing facility as she is still quite unsteady on her feet.  We do not wish her fall.     2.  Telemetry with prolonged QT interval.  She was a bit bradycardic and had a QT interval.  Dr. Mary Sella saw the patient.  He did not think that the patient had any serious cause for QT interval except perhaps hypokalemia which was repleted.   3.  Hypokalemia.  As mentioned, this was repleted.   4.  Bradycardia.  She was bradycardic and was on metoprolol for hypertension so the metoprolol was held.     5.  Anticoagulation therapy.  She was on aspirin.  This was stopped and after discussion with Dr. Byrd Hesselbach, I would like to continue stopping this for at least another week to 10 days.  She was given  Flowtrons for DVT prophylaxis.      DISCHARGE DIAGNOSES:   1.  Subdural hematoma.   2.  Falling.   3.  Bradycardia.   4.  Prolonged QT interval.   5.  Hypokalemia.   6.  Hypothyroidism.   7.  Rheumatoid arthritis.      MEDICATIONS ON DISCHARGE:   Colace 100 mg daily, Plaquenil 200 mg twice a day, levothyroxine 0.112 mg daily, lisinopril 10 mg daily, methotrexate 12.5 mg every Tuesday, K-Dur 20 mEq daily, folic acid 1 mg daily, calcium 600 mg daily, acetaminophen 650 q.6 hours p.r.n., Percocet  5/325 every 6 hours as needed.        FOLLOWUP:   In the skilled nursing facility by the physician on call in that facility.  Dr. Emmit Alexanders will return from a vacation after approximately 7 or 8 days.      My Medications       STOP taking these medications       STOP taking these medications      HYDROcodone-acetaminophen 5-325 mg per tablet    Commonly known as: NORCO    ??      TAKE these medications as instructed       TAKE these medications as instructed              Instructions  Each Dose to Equal  Morning  Noon  Evening  Bedtime             albuterol-ipratropium 2.5 mg-0.5 mg/3 ml Nebu    Commonly known as: DUO-NEB    Your last dose was:     Your next dose is:     ??   3 mL by Nebulization route every four (4) hours as needed (sob).   3 mL                      CALCIUM 600 + D 600-125 mg-unit Tab    Generic drug: calcium-cholecalciferol (d3)    Your last dose was:     Your next dose is:     ??   Take 1 Tab by mouth daily.   1 Tab              folic acid 1 mg tablet    Commonly known as: [employer]  Your last dose was:     Your next dose is:     ??   Take 1 mg by mouth daily.   1 mg              hydroxychloroquine 200 mg tablet    Commonly known as: PLAQUENIL    Your last dose was:     Your next dose is:     ??   Take 200 mg by mouth two (2) times a day.   200 mg              LASIX 40 mg tablet    Generic drug: furosemide    Your last dose was:     Your next dose is:     ??   Take 40 mg by mouth daily.   40 mg               levothyroxine 125 mcg tablet    Commonly known as: SYNTHROID    Your last dose was:     Your next dose is:     ??   Take 0.75 mcg by mouth Daily (before breakfast).   0.75 mcg              lisinopril 10 mg tablet    Commonly known as: PRINIVIL, ZESTRIL    Your last dose was:     Your next dose is:     ??   Take 10 mg by mouth daily.   10 mg              methotrexate 2.5 mg tablet    Commonly known as: RHEUMATREX    Your last dose was:     Your next dose is:     ??   Take 12.5 mg by mouth every Tuesday. Methotrexate 2.5 mg tab, take 5 tabs po q week Q tuesday    12.5 mg                      metoprolol tartrate 50 mg tablet    Commonly known as: LOPRESSOR    Your last dose was:     Your next dose is:     ??   Take 50 mg by mouth two (2) times a day.   50 mg                      potassium chloride SR 10 mEq tablet    Commonly known as: KLOR-CON 10    Your last dose was:     Your next dose is:     ??   Take 10 mEq by mouth daily.   10 mEq              predniSONE 10 mg tablet    Commonly known as: DELTASONE    Your last dose was:     Your next dose is:     ??   Take by mouth daily (with breakfast).                Where to Get Your Medications       Information on where to get these meds will be given to you by the nurse or doctor.       Ask your nurse or doctor about these medications          albuterol-ipratropium 2.5 mg-0.5 mg/3 ml Nebu             ??  ___________________   Gracy RacerLouis Carideo MD   Dictated By: .    [facility]   D:08/30/2017 20:34:07   T: 08/30/2017 21:35:09   [id number]

## 2017-08-30 NOTE — Progress Notes (Signed)
Report received from Alvino Chapel, [place]. Patient being transferred to 6 west room [phone].

## 2017-08-30 NOTE — Progress Notes (Signed)
Neurosurgery progress NOte;  Asked to look at follow up head ct done because of Headache; no significant change.  Head ache is a part of her head injury  Remains alert oriented x 3 cranial nerves intact non focal examination   A; no change in subdural  P; still okay to tranfer to the floor and to rehab. iwill see in a few weeks.  A subdural in this location would be very unusual to cause surgical issues

## 2017-08-30 NOTE — Discharge Summary (Addendum)
[facility]  [facility] Summary   NAME:  [name] [name], [name]  SEX:   F  ADMIT: 08/27/2017  DISCH:   DOB:   April 14, 1933  MR#    [id number]  ACCT#  [id number]        DATE OF ADMISSION:   08/27/2017    DATE OF DISCHARGE:   08/31/2017    ADMITTING PHYSICIAN:  Caralyn Guilehomas Mauser, DO    DISCHARGING PHYSICIAN:  Gracy RacerLouis Carideo, MD     SUMMARY OF HISTORY:  This 84 year old female with a history of rheumatoid arthritis, hypertension, hemochromatosis, and hypothyroidism fell this morning after getting out of bed.  She struck her head on the dresser and eased herself down to the floor.  She had considerable bleeding.  She was brought to the emergency room.  In the emergency room, she was found to be normotensive but a CAT scan showed a 1 cm subdural hematoma, interhemispheric, and CT of the neck was negative for fracture.  Chest x-ray was negative.  X-ray of the hand was negative.  The patient was admitted to the ICU for close observation and neurosurgical intervention.    PAST HISTORY:  1.  No known history of heart disease.  Echo in 2018 showed EF of 55%, grade 1 diastolic dysfunction.  2.  A 48-hour Holter monitor also from 12/2016 showed supraventricular ectopy, mostly wandering, atrial pacemaker, and sinus tachycardia.  3.  Hypertension.  4.  Rheumatoid arthritis.    5.  Hemochromatosis with periodic phlebotomies.  6.  Hypothyroidism, on replacement therapy.  7.  Osteoporosis.  8.  Diverticulitis by history.  9.  Multiple surgeries including left hip surgery, right hip surgery, left knee replacement, left femur fracture repair secondary to fall, kidney stone extraction, thyroid surgery, total abdominal hysterectomy with bilateral salpingo-oophorectomy, 3 C-sections, partial colectomy, negative breast biopsy on the left, right shoulder surgery, and right carpal tunnel surgery.    PHYSICAL ON ADMISSION:    Remarkable for a blood pressure of 119/55, pulse 57, respiratory rate 26,  O2 sat _____ 0210.  Examination was otherwise remarkable for intact neurological exam with fluent speech and motor strength normal.  Lungs clear to auscultation and percussion.  Cardiac exam showed grade 3 systolic murmur over the aortic area with a normal S2.  There is a skin tear on the dorsum of the right hand.  There is a bandage on her head where she received sutures for a laceration.    HOSPITAL COURSE AND COMPLICATIONS:  1.  Subdural hematoma.  This patient had a 1 cm subdural hematoma.  It was monitored in the ICU over several days.  It waxed and waned in intensity by 1 mm, which was thought in the margin error of measuring it.  Dr. Byrd HesselbachWaters saw the patient from neurosurgery.  She was transferred to the floor on the 17th.  Physical therapy was begun.  She will be considered for discharge on the 18th to a skilled nursing facility as she is still quite unsteady on her feet.  We do not wish her fall.    2.  Telemetry with prolonged QT interval.  She was a bit bradycardic and had a QT interval.  Dr. Mary SellaKanter saw the patient.  He did not think that the patient had any serious cause for QT interval except perhaps hypokalemia which was repleted.  3.  Hypokalemia.  As mentioned, this was repleted.  4.  Bradycardia.  She was bradycardic and was on metoprolol for hypertension so the metoprolol was held.  5.  Anticoagulation therapy.  She was on aspirin.  This was stopped and after discussion with Dr. Byrd Hesselbach, I would like to continue stopping this for at least another week to 10 days.  She was given Flowtrons for DVT prophylaxis.    DISCHARGE DIAGNOSES:  1.  Subdural hematoma.  2.  Falling.  3.  Bradycardia.  4.  Prolonged QT interval.  5.  Hypokalemia.  6.  Hypothyroidism.  7.  Rheumatoid arthritis.    MEDICATIONS ON DISCHARGE:  Colace 100 mg daily, Plaquenil 200 mg twice a day, levothyroxine 0.112 mg daily, lisinopril 10 mg daily, methotrexate 12.5 mg every Tuesday, K-Dur  20 mEq daily, folic acid 1 mg daily, calcium 600 mg daily, acetaminophen 650 q.6 hours p.r.n., Percocet 5/325 every 6 hours as needed.      FOLLOWUP:  In the skilled nursing facility by the physician on call in that facility.  Dr. Emmit Alexanders will return from a vacation after approximately 7 or 8 days.    My Medications     STOP taking these medications     STOP taking these medications   HYDROcodone-acetaminophen 5-325 mg per tablet   Commonly known as: NORCO   ??    TAKE these medications as instructed     TAKE these medications as instructed    Instructions Each Dose to Equal Morning Noon Evening Bedtime   albuterol-ipratropium 2.5 mg-0.5 mg/3 ml Nebu   Commonly known as: DUO-NEB   Your last dose was:    Your next dose is:    ??  3 mL by Nebulization route every four (4) hours as needed (sob).  3 mL        CALCIUM 600 + D 600-125 mg-unit Tab   Generic drug: calcium-cholecalciferol (d3)   Your last dose was:    Your next dose is:    ??  Take 1 Tab by mouth daily.  1 Tab        folic acid 1 mg tablet   Commonly known as: [employer]   Your last dose was:    Your next dose is:    ??  Take 1 mg by mouth daily.  1 mg        hydroxychloroquine 200 mg tablet   Commonly known as: PLAQUENIL   Your last dose was:    Your next dose is:    ??  Take 200 mg by mouth two (2) times a day.  200 mg        LASIX 40 mg tablet   Generic drug: furosemide   Your last dose was:    Your next dose is:    ??  Take 40 mg by mouth daily.  40 mg        levothyroxine 125 mcg tablet   Commonly known as: SYNTHROID   Your last dose was:    Your next dose is:    ??  Take 0.75 mcg by mouth Daily (before breakfast).  0.75 mcg        lisinopril 10 mg tablet   Commonly known as: PRINIVIL, ZESTRIL   Your last dose was:    Your next dose is:    ??  Take 10 mg by mouth daily.  10 mg        methotrexate 2.5 mg tablet   Commonly known as: RHEUMATREX   Your last dose was:    Your next dose is:    ??  Take 12.5 mg by mouth every Tuesday. Methotrexate 2.5  mg tab, take 5  tabs po q week Q tuesday  12.5 mg        metoprolol tartrate 50 mg tablet   Commonly known as: LOPRESSOR   Your last dose was:    Your next dose is:    ??  Take 50 mg by mouth two (2) times a day.  50 mg        potassium chloride SR 10 mEq tablet   Commonly known as: KLOR-CON 10   Your last dose was:    Your next dose is:    ??  Take 10 mEq by mouth daily.  10 mEq        predniSONE 10 mg tablet   Commonly known as: DELTASONE   Your last dose was:    Your next dose is:    ??  Take by mouth daily (with breakfast).         Where to Get Your Medications     Information on where to get these meds will be given to you by the nurse or doctor.     Ask your nurse or doctor about these medications       albuterol-ipratropium 2.5 mg-0.5 mg/3 ml Nebu      ??         ___________________  Gracy Racer MD  Dictated By: .   [facility]  D:08/30/2017 20:34:07  T: 08/30/2017 21:35:09  [id number]

## 2017-08-30 NOTE — Progress Notes (Signed)
Upon arrival to unit patient had one episode of emesis. Pt stated that the movement from the wheelchair to bed caused her to feel nauseous. Pt currently resting in bed with family at bedside. Dr. Carideo notified and orders received for IV Zofran 4mg  Q6 PRN. Will continue to monitor.

## 2017-08-30 NOTE — Progress Notes (Addendum)
[facility] ICU Nursing Progress Note  08/29/17    06/16 1901 - 06/17 0700  In: 120 [P.O.:120]  Out: 400 [Urine:400]                                                                                          Intake/Output Summary (Last 24 hours) at 08/30/2017 0639  Last data filed at 08/30/2017 0130  Gross per 24 hour   Intake 120 ml   Output 1150 ml   Net -1030 ml        Last 3 Recorded Weights in this Encounter    08/27/17 0801   Weight: 72.6 kg (160 lb)       No results found for this or any previous visit (from the past 24 hour(s)).      Overnight Shift Events:  1935:  ?? Bedside and verbal shift change report received from Chris, off-going RN.  Provider's instructions/meds/plan for current shift reviewed with pt by this writer; ongoing education rendered at this time.    ?? Pt lying in bed, call bell within reach, bed in lowest position, side rails up x3 & bed alarm engaged for pt safety; will continue to monitor.     0000:  ?? No changes from initial assessment; see flowsheets.     0400:  ?? No changes from initial assessment; see flowsheets.     0700:  ?? Bedside shift change report given to Jo (oncoming nurse) by Crystal Ware, RN (offgoing nurse). Report included the following information SBAR, Kardex, Procedure Summary, Intake/Output, MAR, Recent Results and Cardiac Rhythm SA 1st degree AVB prolonged QT.       Date 08/29/17 0700 - 08/30/17 0659 08/30/17 0700 - 08/31/17 0659   Shift 0700-1859 1900-0659 24 Hour Total 0700-1859 1900-0659 24 Hour Total   INTAKE   P.O.  120 120        P.O.  120 120      Shift Total(mL/kg)  120(1.7) 120(1.7)      OUTPUT   Urine(mL/kg/hr) 750(0.9) 400 1150        Urine Voided 750 400 1150      Shift Total(mL/kg) 750(10.3) 400(5.5) 1150(15.8)      NET -750 -280 -1030      Weight (kg) 72.6 72.6 72.6 72.6 72.6 72.6

## 2017-08-30 NOTE — Progress Notes (Signed)
Problem: Falls - Risk of  Goal: *Absence of Falls  Description  Document Schmid Fall Risk and appropriate interventions in the flowsheet.  Outcome: Progressing Towards Goal     Problem: Pressure Injury - Risk of  Goal: *Prevention of pressure injury  Description  Document Braden Scale and appropriate interventions in the flowsheet.  Outcome: Progressing Towards Goal

## 2017-08-30 NOTE — Other (Signed)
Dr.Carideo,ambulated pt in the hallway,pt tolerated well.

## 2017-08-30 NOTE — Progress Notes (Signed)
Neurosurgery progress NOte;  Asked to look at follow up head ct done because of Headache; no significant change.  Head ache is a part of her head injury  Remains alert oriented x 3 cranial nerves intact non focal examination   A; no change in subdural  P; still okay to tranfer to the floor and to rehab. iwill see in a few weeks.  A subdural in this location would be very unusual to cause surgical issues

## 2017-08-30 NOTE — Other (Signed)
Temp 100.8 at 2100,complained of sharp pain in the forehead and bilateral cheeks area,discussed with Dr.Carideo,order for CT head w/o contrast and morphine received.  1040 CT of the head w/o contrast completed,pt tolerated well,results pending at this time.neuro status unchange,pain score 4/10,daughter at bedside  1050 pt sitting up in the chair,breakfast served

## 2017-08-30 NOTE — Other (Addendum)
TRANSFER - OUT REPORT:    Verbal report given to B.Komrek,RN(name) on [name] [name]  being transferred to 6606(unit) for routine progression of care       Report consisted of patient???s Situation, Background, Assessment and   Recommendations(SBAR).     Information from the following report(s) SBAR, Kardex, Intake/Output, MAR, Recent Results, Med Rec Status and Cardiac Rhythm SA with firstdegree block was reviewed with the receiving nurse.    Lines:   Peripheral IV 08/27/17 Left Forearm (Active)   Site Assessment Clean, dry, & intact 08/29/2017  8:00 PM   Phlebitis Assessment 0 08/29/2017  8:00 PM   Infiltration Assessment 0 08/29/2017  8:00 PM   Dressing Status Clean, dry, & intact 08/29/2017  8:00 PM   Dressing Type Transparent;Tape 08/29/2017  8:00 PM   Hub Color/Line Status Capped 08/29/2017  8:00 PM   Action Taken Open ports on tubing capped 08/29/2017  7:43 AM   Alcohol Cap Used Yes 08/29/2017  7:43 AM        Opportunity for questions and clarification was provided.      Patient transported with:   Monitor  Registered Nurse    Daughter aware of transfer and room number.

## 2017-08-30 NOTE — Progress Notes (Addendum)
physical Therapy EVALUATION    Patient: [name] [name] ([age] y.o. female)  Room: I409/I409    Date: 08/30/2017  Start Time:  14:29  End Time:  15:00    Primary Diagnosis: Subdural hematoma caused by concussion (HCC) [S06.5X9A]  Subdural hematoma (HCC) [S06.5X9A]         Precautions: Falls.    Orders reviewed, chart reviewed, discussed with patient's RN and initial evaluation completed on [name] MungoBarbara K Johns.    ASSESSMENT :  Based on the objective data described below, the patient presents with     Decreased Strength  Decreased ADL/Functional Activities  Decreased Transfer Abilities  Decreased Ambulation Ability/Technique  Decreased Balance  Increased Pain  Decreased Activity Tolerance  Increased Fatigue.    Patient will benefit from skilled intervention to address the above impairments.  Patient???s rehabilitation potential is considered to be Good    Pt with increased headache, but reported it is feeling better after taking pain meds.  Pt demo with B LE weakness, especially at L LE.  Pt with history of two L hip replacements, one R hip replacement, L knee replacement and L femur surgery due to fracture several years ago.  Pt also stated 3 falls in the last 6 months.  Pt ambulated 40' with walker and min assist, presenting with antalgic gait with decreased swing phase at L LE.        PLAN :  Planned Interventions:  Functional mobility training Gait Training Balance Training Therapeutic exercises Therapeutic activities Neuro muscular re-education AD training Patient/caregiver education  To increase strength to allow a return to a pre-morbid status of pain free activities of daily living  Frequency/Duration: Patient will be followed by physical therapy 3x / Week and 5x / Week for 2 weeks to address goals.  ??  PT Goals:  ??  - Patient??will??be??independent??with??bed mobility and independent with??transfers in preparation for OOB activities and ambulation.  - Patient will??ambulate ??at??least??100 feet with CGA??with??least restrictive  device??to promote??room??ambulation.??  - Patient will be able to sit in a chair for at least 45-90 min for respiratory hygiene.  - Patient will demo with increased dynamic balance to at least level??"good" to indicate decreased risk for falls.  - Patient will demo with increased hip flex bilaterally to at least 4/5 to increase gait biomechanics.  - Patient will be independent with HEP to facilitate recovery.??  ??    Recommendations:  Physical Therapy and Occupational Therapy  Discharge Recommendations: SNF  Further Equipment Recommendations for Discharge: TBD at rehab facility.        SUBJECTIVE:   Patient: pt reported before she fell she was feeling dizzy and she lowered her self down near her dresser, but she hit her head on the chest and it was bleeding. Pt stated her daughter was with her that day.     OBJECTIVE DATA SUMMARY:   Present illness history:   Problem List  Never Reviewed          Codes Class Noted    Deep venous thrombosis (HCC) ICD-10-CM: I82.409  ICD-9-CM: 453.40  Unknown        Subdural hematoma caused by concussion [facility](HCC) ICD-10-CM: X09.6E4VS06.5X9A  ICD-9-CM: 852.29  08/27/2017        Subdural hematoma (HCC) ICD-10-CM: W09.8J1BS06.5X9A  ICD-9-CM: 432.1  08/27/2017             Past Medical history:   Past Medical History:   Diagnosis Date   ??? Arthritis    ??? Deep venous thrombosis (HCC)    ???  Endocrine disease     hypothyroid   ??? Hemochromatosis    ??? Hypertension        Prior Level of Function/Home Situation:   Home environment: [place] alone, [address] [place], 2 story, Bedroom 1st floor, Entrance steps : 1 step.  Prior level of function: Ambulates with cane when outside, sometimes uses it at home as well.  Prior level of Activities of Daily Living: Independent with all ADLs.  Home equipment: rolling walker, rollator and straight cane      Patient found: Bed, Telemetry, ICU/stepdown equip, Oxygen and IV. Daughter arrived during PT session.     Pain Assessment before PT session: 6/10  Pain Location:  headache   Pain Assessment after PT session: Not rated, but feels better   Pain Location:  headache  [x]           Yes, patient had pain medications  []           No, Patient has not had pain medications  [x]           Nurse notified    COGNITIVE STATUS:     Mental Status: Oriented x3 and pleasant.  Communication: grossly intact.  Follows commands: 1 step and 2 step.  General Cognition: no deficits.  Hearing: grossly intact.  Vision:  grossly intact.      EXTREMITIES ASSESSMENT:      Tone & Sensation:   Intact sensation to light touch at B LE    Strength:    R LE grossly 4-/5  L LE grossly 3/5    Range Of Motion:  R LE AROM WFL  L knee flex limited by ~50% with AROM/PROM    Functional mobility and balance status:     Sit to Stand -  min. assist, increased time and assistive devices: walker  Stand to Sit -  min. assist, walker    Balance:   Static Sitting Balance -  good  Dynamic Sitting Balance -  good  Static Standing Balance -  fair+ and fair  Dynamic Standing Balance -  fair    Ambulation/Gait Training:  steady, reciprocal, decreased cadence, decreased step height/length, antalgic and difficulty/unable to weight shift Walker, gait belt, min assist, followed by [id number]  Decreased swing phase at L LE, no c/o increased pain during ambulation.     Stair Training:  not tested      Therapeutic Exercises:    Patient received/participated in 20 minutes of treatment (therapeutic exercises/activities) immediately following evaluation and/or educational instruction during/immediately following PT evaluation.    Lower Extremities:  Supine, Seated, Glut Set, Quad Set, Heel Slide, Long arc quad, Straight leg raise, Ankle pumps, BLE and 10 reps    Activity Tolerance:   good- and fair+    Final Location:   bedside chair, all needs close, agrees to call for assistance, nurse notified , family present and oxygen    COMMUNICATION/EDUCATION:   Education: Patient, Daughter, Benefit of activity while hospitalized, Call  for assistance, Out of bed 2-3 times/day, Staff assistance with mobility, Changes positions frequently, Adaptive equipment use, Safety, Functional mobility and Verbalized understanding, PT role and POC.  Barriers to Learning/Limitations: None    Please refer to care plan and patient education section for further details.    Thank you for this referral.     Olena Ivanova/PT   Pager: [phone]

## 2017-08-30 NOTE — Other (Signed)
Dr.Carideo,called,discussed CT head results,new order received.

## 2017-08-30 NOTE — Progress Notes (Addendum)
Report received from Jo, RN. Patient being transferred to 6 west room 6619.

## 2017-08-30 NOTE — Progress Notes (Signed)
General Daily Progress Note    NAME:  [name] [name]   DOB:   [birth year]   MRN:   [id number]     PCP:  Blenda BridegroomMauser, Thomas L, MD  Date/Time:  08/30/2017 4:53 PM    Admit Date: 08/27/2017  Hospital day: 3    Assessment / Plan:   1. ACUTE SUBDURAL:  Severe headache today prompting repeat CT. Reading was increase in size by 1mm  Though yesterday reading was decrease by nmm.    2. HEADACHE: seems to have abated with narcotic which I will use with caution with falling    3. HYPERTENTION:  Will reinstute lower dose of lisinopril without metoprolol.      LAB REVIEWED  DISCUSSED PATIENT WITH  NURSE    No results found for this or any previous visit (from the past 24 hour(s)).    Current Facility-Administered Medications   Medication Dose Route Frequency   ??? oxyCODONE-acetaminophen (PERCOCET) 5-325 mg per tablet 1 Tab  1 Tab Oral Q6H PRN   ??? traMADol (ULTRAM) tablet 50 mg  50 mg Oral Q8H PRN   ??? sodium chloride (NS) flush 5-10 mL  5-10 mL IntraVENous Q8H   ??? acetaminophen (TYLENOL) tablet 650 mg  650 mg Oral Q6H PRN   ??? albuterol-ipratropium (DUO-NEB) 2.5 MG-0.5 MG/3 ML  3 mL Nebulization Q4H PRN   ??? CALCIUM ELECTROLYTE REPLACEMENT PROTOCOL STANDARD DOSING  1 Each Other PRN   ??? POTASSIUM ELECTROLYTE REPLACEMENT PROTOCOL STANDARD DOSING  1 Each Other PRN   ??? MAGNESIUM ELECTROLYTE REPLACEMENT PROTOCOL STANDARD DOSING  1 Each Other PRN   ??? levothyroxine (SYNTHROID) tablet 112 mcg  112 mcg Oral ACB   ??? docusate sodium (COLACE) capsule 100 mg  100 mg Oral DAILY   ??? guaiFENesin-dextromethorphan (ROBITUSSIN DM) 100-10 mg/5 mL syrup 10 mL  10 mL Oral Q6H PRN   ??? potassium chloride (K-DUR, KLOR-CON) SR tablet 20 mEq  20 mEq Oral DAILY          REVIEW OF SYSTEMS:    HEENT:  Headache       Objective:        Visit Vitals  BP 138/71   Pulse 83   Temp 98.9 ??F (37.2 ??C)   Resp 18   Ht 5\' 2"  (1.575 m)   Wt 72.6 kg (160 lb)   SpO2 97%   BMI 29.26 kg/m??       Physical Exam:   General: Appears stated age. No distress noted. Vital signs noted.   Neck: No thyromegaly, supple, no bruits  Lungs: Clear to A&P bilaterally.  Heart:: PMI not displaced, no m or gallop, rate irregular (wandering atrial pacer)    [name] RacerLouis Mandi Mattioli, MD  August 30, 2017

## 2017-08-30 NOTE — Progress Notes (Signed)
OCCUPATIONAL THERAPY EVALUATION     Patient: [name] [name] ([age] y.o. female)  Room: I409/I409    Date: 08/30/2017  Start Time: 1511        End Time:  1533    Primary Diagnosis: Subdural hematoma caused by concussion (HCC) [S06.5X9A]  Subdural hematoma (HCC) [S06.5X9A]         Precautions:  Falls.     Ordered Weight Bearing Status: None      ASSESSMENT :  Based on the objective data described below, the patient presents with   Decreased Strength  Decreased ADL/Functional Activities  Decreased Transfer Abilities  Decreased Ambulation Ability/Technique  Decreased Balance  Decreased Activity Tolerance  Increased Fatigue  Decreased Flexibility/Joint Mobility  Decreased Skin Integrity/Hygeine    Patient will benefit from skilled occupational therapy intervention to address the above impairments.     Patient???s rehabilitation potential is considered to be Excellent        PLAN :  Planned Interventions: Adaptive equipment, ADI training, activity tolerance, functional balance training, functional mobility training, therapeutic exercise, therapeutic activity, patient/caregiver education and training, home exercise program and modalities as needed.  Frequency/Duration: Patient will be followed by occupational  therapy Continue OT POC, 1-5x/week x 2   weeks to address goals.  Discharge Recommendations: SNF.  Further Equipment Recommendations for Discharge: To be determined.     Please refer to Patient Education and Care Plan sections of chart for additional details.  (Patient and/or family have participated as able in goal setting and plan of care.)  EDUCATION:     Barriers to Learning/Limitations:  yes;  emotional, physical and altered mental status (i.e.Sedation, Confusion)  Education provided patient and child(ren) on  Role of OT, Call for assistance, Out of bed 2-3 times/day, Staff assistance with mobility, Changes positions frequently, Sit out of bed for 45-60 minutes or as  tolerated, ADL training, Adaptive equipment use, Bathroom safety, Home safety, Energy conservation, HEP, Safety, Functional mobility, Demonstrates adequately, Verbalized understanding, Reinforce needed, Teaching method, Verbal and Written  Educational Handouts issued:   AROM ex., Adaptive equipment, Home safety and Bathroom safety    SUBJECTIVE     Patient reports, " I was living on my own; Dr. Billy [name] said I can't anymore.  I need to go to Assistive Living.  I will go to rehab to get stronger so I do well at assistive living."    " I cut up my hand when I fell"  Bandage on R dominant hand      OBJECTIVE DATA SUMMARY   Orders, labs, and chart reviewed on [name] [name]. Discussed with Rose/Jo RN    Patient was admitted to the hospital on 08/27/2017 with   Chief Complaint   Patient presents with   ??? Fall   ??? Laceration     Present illness history:   Patient Active Problem List    Diagnosis Date Noted   ??? Subdural hematoma caused by concussion (HCC) 08/27/2017   ??? Subdural hematoma (HCC) 08/27/2017   ??? Deep venous thrombosis (HCC)       Previous medical history:   Past Medical History:   Diagnosis Date   ??? Arthritis    ??? Deep venous thrombosis (HCC)    ??? Endocrine disease     hypothyroid   ??? Hemochromatosis    ??? Hypertension        Patient found: chair, (+) telemetry, (+) oxygen, (+) IV, (+) family: daughter present      Pain Assessment: 5/10  Pain  Location:  Headache pain; wearing ice pack on head upon arrival      PRIOR LEVEL OF FUNCTION / LIVING SITUATION     Information was obtained by:   patient  Home environment:   Lives alone, 2 story, Bedroom 1st floor, Condo/[employer], Entrance steps 1, Steps inside home 12-14 rarely goes up 1-2 x/year   ?? Assistance available following hospital stay:  No: sons live local;  Daughter lives in [place]; minimal support locally  Prior level of function: Retired, drove, independent with ADLs, Ambulates with device, community ambulatory, house hold ambulatory and Entrance  stepsuses SPC home and community distances  Prior level of Instrumental Activities of Daily Living:   Independent with, preparing full meals, preparing light meals/snack and hires housekeeper to clean home;  has walk in tub/jacuzzi and walk in shower with bench  Home equipment: Cane, Grab bars, elevated toilet, walk in tub/jacuzzi, walk in shower with grab bars and hand held shower head    COGNITIVE STATUS     Hearing:   grossly intact  Vision:   grossly intact and reports her opthamologist did a light dusting of her eyes, clearer vision  Patient is alert, oriented x3 with no cognitive deficits    ACTIVITIES OF DAILY LIVING STATUS     Based on direct observation, simulation and clinical assessment.    Eating -  set-up  Grooming -  min. assist and comb/brush hair  UB Bathing -  min. assist, sponge bath and recommend long handled sponge  LB Bathing -  min. assist, sponge bath and use long handled sponge  UB Dressing -  min. assist, set-up, hospital gown, robe and due to IV/tubing  LB Dressing -  min. assist, socks and shoes  Toileting -  NT    FUNCTIONAL MOBILITY and BALANCE STATUS     Mobility:  Sit to Stand -  min. assist  Stand to Sit -  min. assist        Balance:  Static Sitting Balance -  good  Dynamic Sitting Balance -  good-  Static Standing Balance -  fair  Dynamic Standing Balance -  fair- and w/ assistive device    Activity Tolerance: fair.    UPPER EXTREMITY STATUS   Patient has h/o RA;  Limitations with full fist closure bilaterally;  Patient is able to perform 2 pt and lateral pinch so she is able to perform fasteners and has AE in kitchen to assist with jars/lids/containers  Right ROM/Strength:   AROM, WFL, limitations, 25% ROM, 3+/5 and h/o RA;  limitations but functional for ADL's  Left ROM/Strength:   AROM, WFL, limitations, 25% ROM, 50% ROM, 3+/5 and h/o RA;  limitations but functional for ADL's  Right Coordination:   Impaired   Left Coordination:   Impaired   Dominance:   right   Affeccted Extremity:    Bilateral     Patient also has a h/o surgeries with LE's ; including:  B LE weakness, especially at L LE.  Pt with history of two L hip replacements, one R hip replacement, L knee replacement and L femur surgery due to fracture several years ago.  Pt also stated 3 falls in the last 6 months.      Final position: Seated in bed side chair, all needs within reach, agrees to call for assistance, (+) ice to head, nursing staff notified, family present   Bridgette Habermann Fall Risk: Total Score: 4 (08/29/17 2000)    Evaluation Complexity: History: MEDIUM Complexity :  Expanded review of history including physical, cognitive and psychosocial  history ; Examination: MEDIUM Complexity : 3-5 performance deficits relating to physical, cognitive , or psychosocial skils that result in activity limitations and / or participation restrictions; Decision Making:MEDIUM Complexity : Patient may present with comorbidities that affect occupational performnce. Miniml to moderate modification of tasks or assistance (eg, physical or verbal ) with assesment(s) is necessary to enable patient to complete evaluation   Thank you for this referral.    Jill Side, OTR/L    TREATMENT     Patient received / participated in 25 minutes of treatment immediately following evaluation and/or educational instruction during evaluation.    OBJECTIVE: Treatment focus following IE was on ADL's, EC/WS, safety, bathroom safety and DME    ASSESSMENT: Patient/daughter report that due to patients recurrent falls, the family , her doctor and patient have agreed that patient would be better off going to Assistive Living.  Patient does agree and is requesting rehab to maximize independence and safety before going to Assistive Living.  Patient is performing well with ADl's and UE's; primary concern is LE weakness and instability in standing.  Patient has had falls in past six months.   Sock aid issued this date.  Patient has h/o RA; limited fist closure but despite limitations is quite saavy with don buttons/zippers due to appropriate 2 pt and lateral pinch; patient unable to complete a full fist closure but accommodates well.  Patient does have difficulty with cross 4 position and donning socks and shoes.  Instruction provided with use of sock aid and reacher.  Patient already has reacher so therapist demonstrated how to use to don underwear/pants and to  Hold shoe to make it easier to get toes into shoe and use long handled shoehorn (another item patient has at home.)  Energy Conservation:  Patient was instructed in the following energy conservation principles;  1. Re-arranging your environment  2. Eliminating unnecessary effort  3. Planning ahead 4. Prioritizing  5. Breathing - purse lip.  Examples were given for basic ADLS and IADLs.   Handout provided.      PLAN: Continue OT POC, 1-5x/week      Jill Side, OTR/L

## 2017-08-31 MED ORDER — IPRATROPIUM-ALBUTEROL 2.5 MG-0.5 MG/3 ML NEB SOLUTION
2.5 mg-0.5 mg/3 ml | INHALATION_SOLUTION | RESPIRATORY_TRACT | 1 refills | Status: DC | PRN
Start: 2017-08-31 — End: 2021-03-12

## 2017-08-31 MED FILL — BD POSIFLUSH NORMAL SALINE 0.9 % INJECTION SYRINGE: INTRAMUSCULAR | Qty: 10

## 2017-08-31 MED FILL — OXYCODONE-ACETAMINOPHEN 5 MG-325 MG TAB: 5-325 mg | ORAL | Qty: 1

## 2017-08-31 MED FILL — LISINOPRIL 5 MG TAB: 5 mg | ORAL | Qty: 2

## 2017-08-31 MED FILL — LEVOTHYROXINE 112 MCG TAB: 112 mcg | ORAL | Qty: 1

## 2017-08-31 MED FILL — METHOTREXATE SODIUM 2.5 MG TAB: 2.5 mg | ORAL | Qty: 5

## 2017-08-31 MED FILL — HYDROXYCHLOROQUINE 200 MG TAB: 200 mg | ORAL | Qty: 1

## 2017-08-31 MED FILL — DOCUSATE SODIUM 100 MG CAP: 100 mg | ORAL | Qty: 1

## 2017-08-31 MED FILL — POTASSIUM CHLORIDE SR 20 MEQ TAB, PARTICLES/CRYSTALS: 20 mEq | ORAL | Qty: 1

## 2017-08-31 NOTE — Progress Notes (Signed)
 physical Therapy TREATMENT    Patient: [name] [name] (84 y.o. female)  Room: 6619/6619    Date: 08/31/2017  Start Time:  14:47  End Time:  15:10    Primary Diagnosis: Subdural hematoma caused by concussion (HCC) [S06.5X9A]  Subdural hematoma (HCC) [S06.5X9A]         Precautions: Falls.    Orders reviewed, chart reviewed, discussed with patient's RN and initial evaluation completed on JALYSA SWOPES.    ASSESSMENT :  Based on the objective data described below, the patient presents with     - Pt with good progression with therapy   - Reported still having headache as 4/10   - Pt ambulated 50'x2 with walker, demo with increased stability today    Patient will benefit from skilled intervention to address the above impairments.  Patient's rehabilitation potential is considered to be Good         PLAN :  Planned Interventions:  Functional mobility training Gait Training Balance Training Therapeutic exercises Therapeutic activities Neuro muscular re-education AD training Patient/caregiver education  To increase strength to allow a return to a pre-morbid status of pain free activities of daily living  Frequency/Duration: Patient will be followed by physical therapy 3x / Week and 5x / Week for 2 weeks to address goals.      Recommendations:  Physical Therapy and Occupational Therapy  Discharge Recommendations: SNF  Further Equipment Recommendations for Discharge: TBD at rehab facility.        SUBJECTIVE:   Patient: pt stated she had a good night sleep today.     OBJECTIVE DATA SUMMARY:   Present illness history:   Problem List  Never Reviewed          Codes Class Noted    Deep venous thrombosis (HCC) ICD-10-CM: I82.409  ICD-9-CM: 453.40  Unknown        Subdural hematoma caused by concussion [facility]) ICD-10-CM: D93.4K0J  ICD-9-CM: 852.29  08/27/2017        Subdural hematoma (HCC) ICD-10-CM: D93.4K0J  ICD-9-CM: 432.1  08/27/2017             Past Medical history:   Past Medical History:   Diagnosis Date   . Arthritis    . Deep  venous thrombosis (HCC)    . Endocrine disease     hypothyroid   . Hemochromatosis    . Hypertension        Patient found: Bed and IV. Daughter present. Ice pack at head for headache.  Pain Assessment before PT session: 4/10  Pain Location:  headache  Pain Assessment after PT session: 4/10,   Pain Location:  headache  [x]           Yes, patient had pain medications  []           No, Patient has not had pain medications  [x]           Nurse notified    COGNITIVE STATUS:     Mental Status: Oriented x3 and pleasant.  Communication: grossly intact.  Follows commands: 1 step and 2 step.  General Cognition: no deficits.  Hearing: grossly intact.  Vision:  grossly intact.      EXTREMITIES ASSESSMENT:      Tone & Sensation:   Intact sensation to light touch at B LE    Strength:    R LE grossly 4-/5  L LE grossly 3+/5    Range Of Motion:  R LE AROM WFL  L knee flex limited by ~50%  with AROM/PROM    Functional mobility and balance status:     Supine to sit -  contact guard and min. assist  Sit to Supine -  contact guard and min. assist  Sit to Stand -  min. assist, increased time and assistive devices: walker  Stand to Sit -  contact guard and min. assist, walker      Balance:   Static Sitting Balance -  good  Dynamic Sitting Balance -  good  Static Standing Balance -  fair+ and fair  Dynamic Standing Balance -  fair      Ambulation/Gait Training:  steady, reciprocal, decreased cadence, decreased step height/length, antalgic and difficulty/unable to weight shift Walker, gait belt, contact guard and min assist,   50' x 2    Today pt demo with more symmetric gait patterns. Pt reported her hips feel sore.     Stair Training:  not tested      Therapeutic Exercises:    Patient received/participated in 23 minutes of treatment (therapeutic exercises/activities) and/or educational instructions.    Lower Extremities:  Supine, Seated, Glut Set, Quad Set, Heel Slide in supine and in sitting, Short arc quad, Straight leg raise, Hip  ab/duction, Ankle pumps, BLE and 10 reps      Activity Tolerance:   good- and fair+    Final Location:   bed, all needs close, agrees to call for assistance, nurse notified  and family present    COMMUNICATION/EDUCATION:   Education: Patient, Daughter, Benefit of activity while hospitalized, Call for assistance, Staff assistance with mobility, Changes positions frequently, Adaptive equipment use, Safety, Functional mobility and Verbalized understanding, PT role and POC.  Barriers to Learning/Limitations: None    Please refer to care plan and patient education section for further details.    Thank you for this referral.     Olena Ivanova/PT   Pager: [phone]

## 2017-08-31 NOTE — Progress Notes (Signed)
Discharge instructions provided to patient and patient's facility. IV removed. Tele removed. Patient had no further questions.

## 2017-08-31 NOTE — Progress Notes (Signed)
Problem: Falls - Risk of  Goal: *Absence of Falls  Description  Document Bridgette Habermann Fall Risk and appropriate interventions in the flowsheet.  Outcome: Progressing Towards Goal  Note:   Fall Risk Interventions:  Mobility Interventions: Assess mobility with egress test, Bed/chair exit alarm, Communicate number of staff needed for ambulation/transfer, Patient to call before getting OOB, PT Consult for mobility concerns, Utilize walker, cane, or other assistive device         Medication Interventions: Bed/chair exit alarm, Evaluate medications/consider consulting pharmacy, Patient to call before getting OOB, Teach patient to arise slowly    Elimination Interventions: Bed/chair exit alarm, Call light in reach, Patient to call for help with toileting needs, Stay With Me (per policy), Toilet paper/wipes in reach    History of Falls Interventions: Bed/chair exit alarm, Door open when patient unattended, Investigate reason for fall         Problem: Patient Education: Go to Patient Education Activity  Goal: Patient/Family Education  Outcome: Progressing Towards Goal

## 2017-08-31 NOTE — Progress Notes (Signed)
This SW received a referral today for [facility] for skilled care placement. Discussed [facility] with patient and daughter in room this pm.  They signed the DMAS 97.  Will get the DMAS-96 signed by Dr. Robin SearingIslam.

## 2017-08-31 NOTE — Progress Notes (Signed)
Medical Progress Note      NAME: [name] [name]   DOB:  05-04-1933  MRM:  [id number]    Date/Time: 08/31/2017  6:33 AM         Subjective:     Doing fairly well.  Likely she will be discharged to a skilled nursing facility today.  Has intermittent headache but no respiratory distress and no delirium or acute psychosis present    Past Medical History:   Diagnosis Date   ??? Arthritis    ??? Deep venous thrombosis (HCC)    ??? Endocrine disease     hypothyroid   ??? Hemochromatosis    ??? Hypertension      Past Surgical History:   Procedure Laterality Date   ??? HX ORTHOPAEDIC      left knee replacement     Social History     Socioeconomic History   ??? Marital status: WIDOWED     Spouse name: Not on file   ??? Number of children: Not on file   ??? Years of education: Not on file   ??? Highest education level: Not on file   Tobacco Use   ??? Smoking status: Never Smoker   ??? Smokeless tobacco: Never Used   Substance and Sexual Activity   ??? Alcohol use: No   ??? Drug use: No     History reviewed. No pertinent family history.    Past Medical History reviewed and unchanged from Admission History and Physical    Review of Systems         Objective:       Vitals:      Last 24hrs VS reviewed since prior progress note. Most recent are:    Visit Vitals  BP 137/64 (BP 1 Location: Right arm, BP Patient Position: Supine)   Pulse 82   Temp 98.5 ??F (36.9 ??C)   Resp 18   Ht 5\' 2"  (1.575 m)   Wt 76.5 kg (168 lb 10.4 oz)   SpO2 94%   BMI 30.85 kg/m??     SpO2 Readings from Last 6 Encounters:   08/31/17 94%   05/01/16 97%   07/20/14 95%            Intake/Output Summary (Last 24 hours) at 08/31/2017 7829  Last data filed at 08/30/2017 2316  Gross per 24 hour   Intake 605 ml   Output 400 ml   Net 205 ml          Exam:      Physical Exam   Constitutional: She is oriented to person, place, and time. She appears well-developed and well-nourished.   HENT:   Head: Normocephalic and atraumatic.   Eyes: Conjunctivae and EOM are normal. Pupils are equal, round, and reactive to  light.   Neck: Normal range of motion. Neck supple.   Cardiovascular: Normal rate, regular rhythm and normal heart sounds.   Pulmonary/Chest: Effort normal and breath sounds normal.   Abdominal: Soft. Bowel sounds are normal.   Musculoskeletal: Normal range of motion.   Neurological: She is alert and oriented to person, place, and time. She has normal reflexes.   Skin: Skin is warm.   Psychiatric: She has a normal mood and affect.         Lab Data Reviewed: (see below)      Medications:  Current Facility-Administered Medications   Medication Dose Route Frequency   ??? oxyCODONE-acetaminophen (PERCOCET) 5-325 mg per tablet 1 Tab  1 Tab Oral Q6H PRN   ???  lisinopril (PRINIVIL, ZESTRIL) tablet 10 mg  10 mg Oral DAILY   ??? methotrexate (RHEUMATREX) tablet 12.5 mg  12.5 mg Oral every Tuesday   ??? hydroxychloroquine (PLAQUENIL) tablet 200 mg  200 mg Oral BID WITH MEALS   ??? ondansetron (ZOFRAN) injection 4 mg  4 mg IntraVENous Q6H PRN   ??? traMADol (ULTRAM) tablet 50 mg  50 mg Oral Q8H PRN   ??? sodium chloride (NS) flush 5-10 mL  5-10 mL IntraVENous Q8H   ??? acetaminophen (TYLENOL) tablet 650 mg  650 mg Oral Q6H PRN   ??? albuterol-ipratropium (DUO-NEB) 2.5 MG-0.5 MG/3 ML  3 mL Nebulization Q4H PRN   ??? CALCIUM ELECTROLYTE REPLACEMENT PROTOCOL STANDARD DOSING  1 Each Other PRN   ??? POTASSIUM ELECTROLYTE REPLACEMENT PROTOCOL STANDARD DOSING  1 Each Other PRN   ??? MAGNESIUM ELECTROLYTE REPLACEMENT PROTOCOL STANDARD DOSING  1 Each Other PRN   ??? levothyroxine (SYNTHROID) tablet 112 mcg  112 mcg Oral ACB   ??? docusate sodium (COLACE) capsule 100 mg  100 mg Oral DAILY   ??? guaiFENesin-dextromethorphan (ROBITUSSIN DM) 100-10 mg/5 mL syrup 10 mL  10 mL Oral Q6H PRN   ??? potassium chloride (K-DUR, KLOR-CON) SR tablet 20 mEq  20 mEq Oral DAILY       ______________________________________________________________________      Lab Review:     No results for input(s): WBC, HGB, HGBEXT, HCT, HCTEXT, PLT, PLTEXT, HGBEXT, HCTEXT, PLTEXT in the last 72  hours.  Recent Labs     08/30/17  1758 08/29/17  0336   NA 137  --    K 4.0  --    CL 105  --    CO2 22  --    GLU 125*  --    BUN 6*  --    CREA 0.5*  --    CA 8.9  --    MG  --  2.0     No components found for: GLPOC  No results for input(s): PH, PCO2, PO2, HCO3, FIO2 in the last 72 hours.  No results for input(s): INR in the last 72 hours.    No lab exists for component: INREXT, INREXT    Other pertinent lab:   Xr Chest Pa Lat    Result Date: 08/27/2017  IMPRESSION: No acute cardiopulmonary process.     Xr Hand Rt Min 3 V    Result Date: 08/27/2017  IMPRESSION: No evidence of an acute fracture or dislocation. Diffuse moderate to severe degenerative changes.     Ct Head Wo Cont    Result Date: 08/30/2017  IMPRESSION: Minimally increased in size of the patient's parafalcine subdural hematoma. No mass effect.     Ct Head Wo Cont    Result Date: 08/29/2017  IMPRESSION: Minimal decrease in size of interhemispheric subdural hematoma with underlying cerebral atrophy and nonspecific white matter changes.     Ct Head Wo Cont    Result Date: 08/28/2017  IMPRESSION: Stable interhemispheric subdural hematoma with underlying cerebral atrophy and nonspecific white matter changes not unusual for age.     Ct Head Wo Cont    Result Date: 08/27/2017  IMPRESSION: 1. Acute interhemispheric fissure subdural hemorrhage. 2. Chronic small vessel ischemic change.     Ct Spine Cerv Wo Cont    Result Date: 08/27/2017  IMPRESSION: 1. No acute fracture or traumatic subluxation. 2. Nonacute fracture deformity of the right posterior second rib. 3. Multilevel degenerative changes.     nsr  Assessment:     Active Problems:    Subdural hematoma caused by concussion (HCC) (08/27/2017)      Subdural hematoma (HCC) (08/27/2017)    Systemic hypertension controlled  History of rheumatoid arthritis         Plan:     Risk of deterioration: medium             1. Patient likely to go to a skilled nursing facility today  2. Continue supportive treatment as  outlined  3. 30 minutes spent with patient, greater than 50% of the visit was dedicated to counseling, reviewing tests & labs, treatment options and follow up plans.                  ___________________________________________________    Attending Physician: Rayetta HumphreyZattam M Mickenzie Stolar, MD

## 2017-08-31 NOTE — Progress Notes (Signed)
Problem: Falls - Risk of  Goal: *Absence of Falls  Description  Document Schmid Fall Risk and appropriate interventions in the flowsheet.  Outcome: Progressing Towards Goal     Problem: Pressure Injury - Risk of  Goal: *Prevention of pressure injury  Description  Document Braden Scale and appropriate interventions in the flowsheet.  Outcome: Progressing Towards Goal     Problem: Patient Education: Go to Patient Education Activity  Goal: Patient/Family Education  Outcome: Progressing Towards Goal

## 2017-08-31 NOTE — Progress Notes (Signed)
Problem: Falls - Risk of  Goal: *Absence of Falls  Description  Document Schmid Fall Risk and appropriate interventions in the flowsheet.  Outcome: Progressing Towards Goal  Note:   Fall Risk Interventions:  Mobility Interventions: Assess mobility with egress test, Bed/chair exit alarm, Communicate number of staff needed for ambulation/transfer, Patient to call before getting OOB, PT Consult for mobility concerns, Utilize walker, cane, or other assistive device         Medication Interventions: Bed/chair exit alarm, Evaluate medications/consider consulting pharmacy, Patient to call before getting OOB, Teach patient to arise slowly    Elimination Interventions: Bed/chair exit alarm, Call light in reach, Patient to call for help with toileting needs, Stay With Me (per policy), Toilet paper/wipes in reach    History of Falls Interventions: Bed/chair exit alarm, Door open when patient unattended, Investigate reason for fall         Problem: Patient Education: Go to Patient Education Activity  Goal: Patient/Family Education  Outcome: Progressing Towards Goal

## 2017-08-31 NOTE — Progress Notes (Signed)
Discharge instructions provided to patient and patient's facility. IV removed. Tele removed. Patient had no further questions.

## 2017-08-31 NOTE — Progress Notes (Addendum)
physical Therapy TREATMENT    Patient: [name] [name] (84 y.o. female)  Room: 6619/6619    Date: 08/31/2017  Start Time:  14:47  End Time:  15:10    Primary Diagnosis: Subdural hematoma caused by concussion (HCC) [S06.5X9A]  Subdural hematoma (HCC) [S06.5X9A]         Precautions: Falls.    Orders reviewed, chart reviewed, discussed with patient's RN and initial evaluation completed on [name] [name].    ASSESSMENT :  Based on the objective data described below, the patient presents with     - Pt with good progression with therapy   - Reported still having headache as 4/10   - Pt ambulated 50'x2 with walker, demo with increased stability today    Patient will benefit from skilled intervention to address the above impairments.  Patient???s rehabilitation potential is considered to be Good         PLAN :  Planned Interventions:  Functional mobility training Gait Training Balance Training Therapeutic exercises Therapeutic activities Neuro muscular re-education AD training Patient/caregiver education  To increase strength to allow a return to a pre-morbid status of pain free activities of daily living  Frequency/Duration: Patient will be followed by physical therapy 3x / Week and 5x / Week for 2 weeks to address goals.  ??    Recommendations:  Physical Therapy and Occupational Therapy  Discharge Recommendations: SNF  Further Equipment Recommendations for Discharge: TBD at rehab facility.        SUBJECTIVE:   Patient: pt stated she had a good night sleep today.     OBJECTIVE DATA SUMMARY:   Present illness history:   Problem List  Never Reviewed          Codes Class Noted    Deep venous thrombosis (HCC) ICD-10-CM: I82.409  ICD-9-CM: 453.40  Unknown        Subdural hematoma caused by concussion [facility]) ICD-10-CM: X09.6E4V  ICD-9-CM: 852.29  08/27/2017        Subdural hematoma (HCC) ICD-10-CM: W09.8J1B  ICD-9-CM: 432.1  08/27/2017             Past Medical history:   Past Medical History:   Diagnosis Date   ??? Arthritis     ??? Deep venous thrombosis (HCC)    ??? Endocrine disease     hypothyroid   ??? Hemochromatosis    ??? Hypertension        Patient found: Bed and IV. Daughter present. Ice pack at head for headache.  Pain Assessment before PT session: 4/10  Pain Location:  headache  Pain Assessment after PT session: 4/10,   Pain Location:  headache  [x]           Yes, patient had pain medications  []           No, Patient has not had pain medications  [x]           Nurse notified    COGNITIVE STATUS:     Mental Status: Oriented x3 and pleasant.  Communication: grossly intact.  Follows commands: 1 step and 2 step.  General Cognition: no deficits.  Hearing: grossly intact.  Vision:  grossly intact.      EXTREMITIES ASSESSMENT:      Tone & Sensation:   Intact sensation to light touch at B LE    Strength:    R LE grossly 4-/5  L LE grossly 3+/5    Range Of Motion:  R LE AROM WFL  L knee flex limited by ~50%  with AROM/PROM  Functional mobility and balance status:     Supine to sit -  contact guard and min. assist  Sit to Supine -  contact guard and min. assist  Sit to Stand -  min. assist, increased time and assistive devices: walker  Stand to Sit -  contact guard and min. assist, walker    Balance:   Static Sitting Balance -  good  Dynamic Sitting Balance -  good  Static Standing Balance -  fair+ and fair  Dynamic Standing Balance -  fair    Ambulation/Gait Training:  steady, reciprocal, decreased cadence, decreased step height/length, antalgic and difficulty/unable to weight shift Walker, gait belt, contact guard and min assist, 50' x 2    Today pt demo with more symmetric gait patterns. Pt reported her hips feel sore.     Stair Training:  not tested      Therapeutic Exercises:    Patient received/participated in 23 minutes of treatment (therapeutic exercises/activities) and/or educational instructions.    Lower Extremities:  Supine, Seated, Glut Set, Quad Set, Heel Slide in supine and in sitting,  Short arc quad, Straight leg raise, Hip ab/duction, Ankle pumps, BLE and 10 reps    Activity Tolerance:   good- and fair+    Final Location:   bed, all needs close, agrees to call for assistance, nurse notified  and family present    COMMUNICATION/EDUCATION:   Education: Patient, Daughter, Benefit of activity while hospitalized, Call for assistance, Staff assistance with mobility, Changes positions frequently, Adaptive equipment use, Safety, Functional mobility and Verbalized understanding, PT role and POC.  Barriers to Learning/Limitations: None    Please refer to care plan and patient education section for further details.    Thank you for this referral.     Olena Ivanova/PT   Pager: [phone]

## 2017-08-31 NOTE — Other (Addendum)
----------  DocumentID: [id number]------------------------------------------------              [facility]                       Patient Education Report         Name: [name], Tais                  Date: 08/27/2017    MRN: [id number]                    Time: 11:31:25 PM         Patient ordered video: 'Patient Safety: Stay Safe While you are in the Hospital'    from [id number] via phone number: [phone] at 11:31:25 PM    Description: This program outlines some of the precautions patients can take to ensure a speedy recovery without extra complications. The video emphasizes the importance of communicating with the healthcare team.    ----------DocumentID: [id number]------------------------------------------------                       [facility]          Patient Education Report - Discharge Summary        Date: 08/31/2017   Time: 4:48:50 PM   Name: [name], Alletta   MRN: [id number]      Account Number: [id number]      Education History:        Patient ordered video: 'Patient Safety: Stay Safe While you are in the Hospital' from [id number] on 08/27/2017 11:31:25 PM

## 2017-08-31 NOTE — Progress Notes (Signed)
Medical Progress Note      NAME: [name] MungoBarbara K [name]   DOB:  [birth year]  MRM:  [id number]    Date/Time: 08/31/2017  6:33 AM         Subjective:     Doing fairly well.  Likely she will be discharged to a skilled nursing facility today.  Has intermittent headache but no respiratory distress and no delirium or acute psychosis present    Past Medical History:   Diagnosis Date   ??? Arthritis    ??? Deep venous thrombosis (HCC)    ??? Endocrine disease     hypothyroid   ??? Hemochromatosis    ??? Hypertension      Past Surgical History:   Procedure Laterality Date   ??? HX ORTHOPAEDIC      left knee replacement     Social History     Socioeconomic History   ??? Marital status: WIDOWED     Spouse name: Not on file   ??? Number of children: Not on file   ??? Years of education: Not on file   ??? Highest education level: Not on file   Tobacco Use   ??? Smoking status: Never Smoker   ??? Smokeless tobacco: Never Used   Substance and Sexual Activity   ??? Alcohol use: No   ??? Drug use: No     History reviewed. No pertinent family history.    Past Medical History reviewed and unchanged from Admission History and Physical    Review of Systems         Objective:       Vitals:      Last 24hrs VS reviewed since prior progress note. Most recent are:    Visit Vitals  BP 137/64 (BP 1 Location: Right arm, BP Patient Position: Supine)   Pulse 82   Temp 98.5 ??F (36.9 ??C)   Resp 18   Ht 5\' 2"  (1.575 m)   Wt 76.5 kg (168 lb 10.4 oz)   SpO2 94%   BMI 30.85 kg/m??     SpO2 Readings from Last 6 Encounters:   08/31/17 94%   05/01/16 97%   07/20/14 95%            Intake/Output Summary (Last 24 hours) at 08/31/2017 [id number]  Last data filed at 08/30/2017 2316  Gross per 24 hour   Intake 605 ml   Output 400 ml   Net 205 ml          Exam:      Physical Exam   Constitutional: She is oriented to person, place, and time. She appears well-developed and well-nourished.   HENT:   Head: Normocephalic and atraumatic.   Eyes: Conjunctivae and EOM are normal. Pupils are equal, round, and  reactive to light.   Neck: Normal range of motion. Neck supple.   Cardiovascular: Normal rate, regular rhythm and normal heart sounds.   Pulmonary/Chest: Effort normal and breath sounds normal.   Abdominal: Soft. Bowel sounds are normal.   Musculoskeletal: Normal range of motion.   Neurological: She is alert and oriented to person, place, and time. She has normal reflexes.   Skin: Skin is warm.   Psychiatric: She has a normal mood and affect.         Lab Data Reviewed: (see below)      Medications:  Current Facility-Administered Medications   Medication Dose Route Frequency   ??? oxyCODONE-acetaminophen (PERCOCET) 5-325 mg per tablet 1 Tab  1 Tab Oral Q6H PRN   ???  lisinopril (PRINIVIL, ZESTRIL) tablet 10 mg  10 mg Oral DAILY   ??? methotrexate (RHEUMATREX) tablet 12.5 mg  12.5 mg Oral every Tuesday   ??? hydroxychloroquine (PLAQUENIL) tablet 200 mg  200 mg Oral BID WITH MEALS   ??? ondansetron (ZOFRAN) injection 4 mg  4 mg IntraVENous Q6H PRN   ??? traMADol (ULTRAM) tablet 50 mg  50 mg Oral Q8H PRN   ??? sodium chloride (NS) flush 5-10 mL  5-10 mL IntraVENous Q8H   ??? acetaminophen (TYLENOL) tablet 650 mg  650 mg Oral Q6H PRN   ??? albuterol-ipratropium (DUO-NEB) 2.5 MG-0.5 MG/3 ML  3 mL Nebulization Q4H PRN   ??? CALCIUM ELECTROLYTE REPLACEMENT PROTOCOL STANDARD DOSING  1 Each Other PRN   ??? POTASSIUM ELECTROLYTE REPLACEMENT PROTOCOL STANDARD DOSING  1 Each Other PRN   ??? MAGNESIUM ELECTROLYTE REPLACEMENT PROTOCOL STANDARD DOSING  1 Each Other PRN   ??? levothyroxine (SYNTHROID) tablet 112 mcg  112 mcg Oral ACB   ??? docusate sodium (COLACE) capsule 100 mg  100 mg Oral DAILY   ??? guaiFENesin-dextromethorphan (ROBITUSSIN DM) 100-10 mg/5 mL syrup 10 mL  10 mL Oral Q6H PRN   ??? potassium chloride (K-DUR, KLOR-CON) SR tablet 20 mEq  20 mEq Oral DAILY       ______________________________________________________________________      Lab Review:     No results for input(s): WBC, HGB, HGBEXT, HCT, HCTEXT, PLT, PLTEXT,  HGBEXT, HCTEXT, PLTEXT in the last 72 hours.  Recent Labs     08/30/17  1758 08/29/17  0336   NA 137  --    K 4.0  --    CL 105  --    CO2 22  --    GLU 125*  --    BUN 6*  --    CREA 0.5*  --    CA 8.9  --    MG  --  2.0     No components found for: GLPOC  No results for input(s): PH, PCO2, PO2, HCO3, FIO2 in the last 72 hours.  No results for input(s): INR in the last 72 hours.    No lab exists for component: INREXT, INREXT    Other pertinent lab:   Xr Chest Pa Lat    Result Date: 08/27/2017  IMPRESSION: No acute cardiopulmonary process.     Xr Hand Rt Min 3 V    Result Date: 08/27/2017  IMPRESSION: No evidence of an acute fracture or dislocation. Diffuse moderate to severe degenerative changes.     Ct Head Wo Cont    Result Date: 08/30/2017  IMPRESSION: Minimally increased in size of the patient's parafalcine subdural hematoma. No mass effect.     Ct Head Wo Cont    Result Date: 08/29/2017  IMPRESSION: Minimal decrease in size of interhemispheric subdural hematoma with underlying cerebral atrophy and nonspecific white matter changes.     Ct Head Wo Cont    Result Date: 08/28/2017  IMPRESSION: Stable interhemispheric subdural hematoma with underlying cerebral atrophy and nonspecific white matter changes not unusual for age.     Ct Head Wo Cont    Result Date: 08/27/2017  IMPRESSION: 1. Acute interhemispheric fissure subdural hemorrhage. 2. Chronic small vessel ischemic change.     Ct Spine Cerv Wo Cont    Result Date: 08/27/2017  IMPRESSION: 1. No acute fracture or traumatic subluxation. 2. Nonacute fracture deformity of the right posterior second rib. 3. Multilevel degenerative changes.     nsr  Assessment:     Active Problems:    Subdural hematoma caused by concussion (HCC) (08/27/2017)      Subdural hematoma (HCC) (08/27/2017)    Systemic hypertension controlled  History of rheumatoid arthritis         Plan:     Risk of deterioration: medium              1. Patient likely to go to a skilled nursing facility today  2. Continue supportive treatment as outlined  3. 30 minutes spent with patient, greater than 50% of the visit was dedicated to counseling, reviewing tests & labs, treatment options and follow up plans.                  ___________________________________________________    Attending Physician: Rayetta Humphrey, MD

## 2017-08-31 NOTE — Progress Notes (Signed)
This SW received a referral today for [facility] for skilled care placement. Discussed [facility] with patient and daughter in room this pm.  They signed the DMAS 97.  Will get the DMAS-96 signed by Dr. Islam.

## 2017-09-01 ENCOUNTER — Inpatient Hospital Stay: Payer: MEDICARE | Attending: Internal Medicine | Primary: Internal Medicine

## 2017-09-01 ENCOUNTER — Ambulatory Visit: Payer: MEDICARE | Primary: Internal Medicine

## 2017-11-22 ENCOUNTER — Encounter

## 2017-11-22 ENCOUNTER — Inpatient Hospital Stay: Admit: 2017-11-22 | Payer: MEDICARE | Attending: Internal Medicine | Primary: Internal Medicine

## 2017-11-22 DIAGNOSIS — Z1231 Encounter for screening mammogram for malignant neoplasm of breast: Secondary | ICD-10-CM

## 2018-02-01 ENCOUNTER — Encounter

## 2018-02-03 ENCOUNTER — Inpatient Hospital Stay: Admit: 2018-02-03 | Payer: MEDICARE | Attending: Neurology | Primary: Internal Medicine

## 2018-02-03 DIAGNOSIS — I6782 Cerebral ischemia: Secondary | ICD-10-CM

## 2018-04-19 ENCOUNTER — Encounter

## 2018-04-20 ENCOUNTER — Inpatient Hospital Stay: Admit: 2018-04-20 | Payer: MEDICARE | Attending: Hematology & Oncology | Primary: Internal Medicine

## 2018-04-20 DIAGNOSIS — I82542 Chronic embolism and thrombosis of left tibial vein: Secondary | ICD-10-CM

## 2018-09-13 IMAGING — CR DG HIP (WITH OR WITHOUT PELVIS) 3-4V BILAT
5 series · 5 of 5 positions shown · non-contrast
Comparison: 02/19/2013

CLINICAL DATA: Fall with left hip pain.

EXAM:
DG HIP (WITH OR WITHOUT PELVIS) 3-4V BILAT

[pelvis ap]
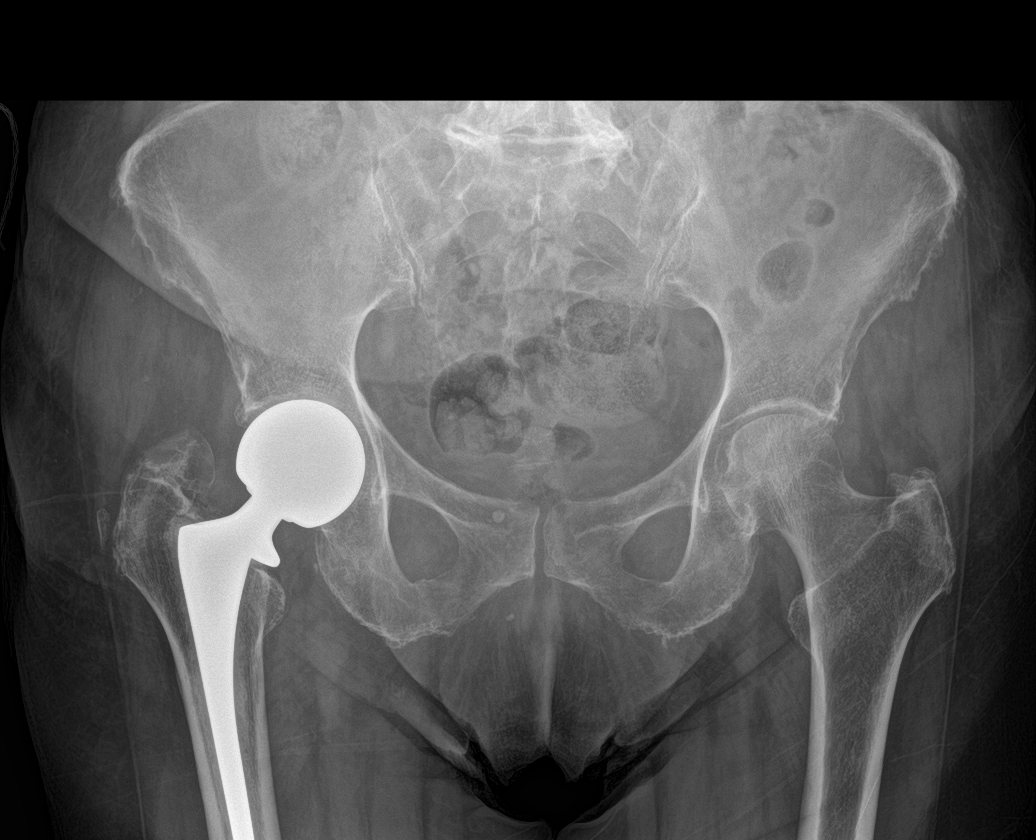

[hip ap (1 of 2)]
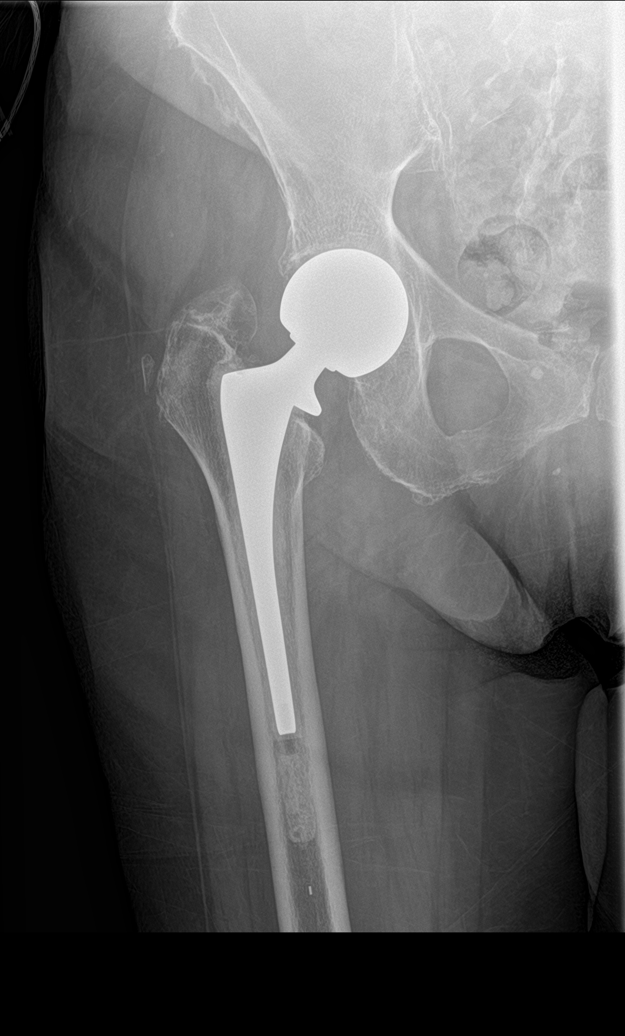

[hip lat (1 of 2)]
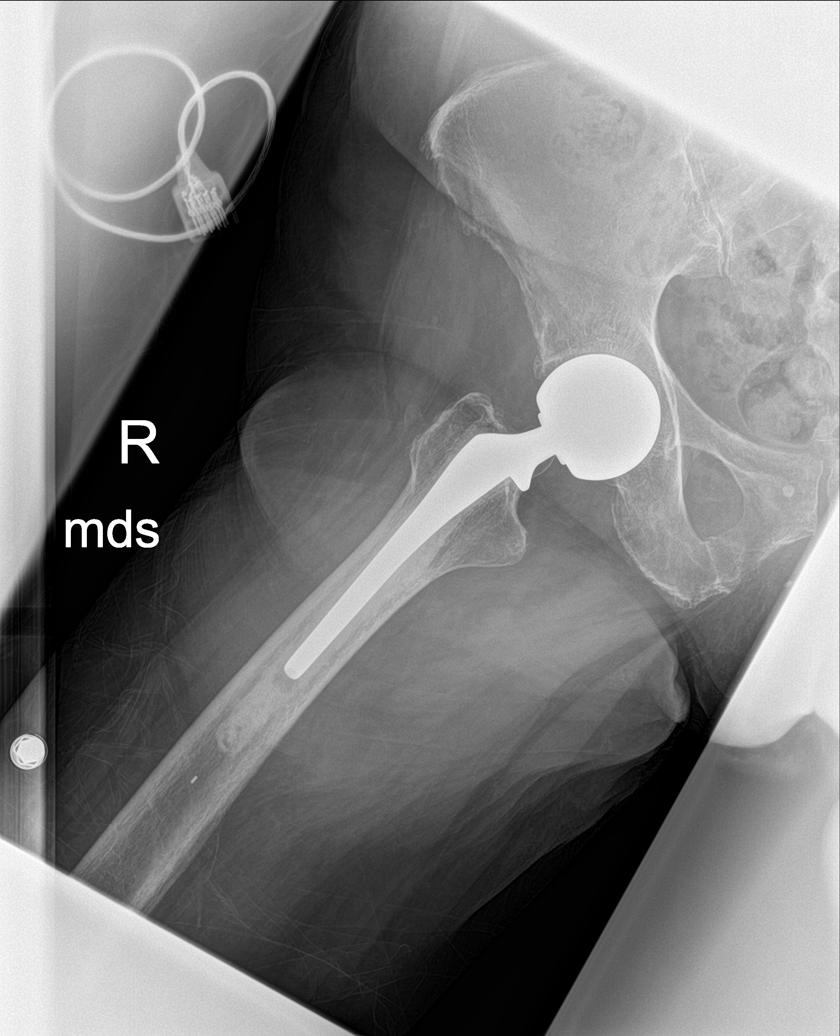

[hip ap (2 of 2)]
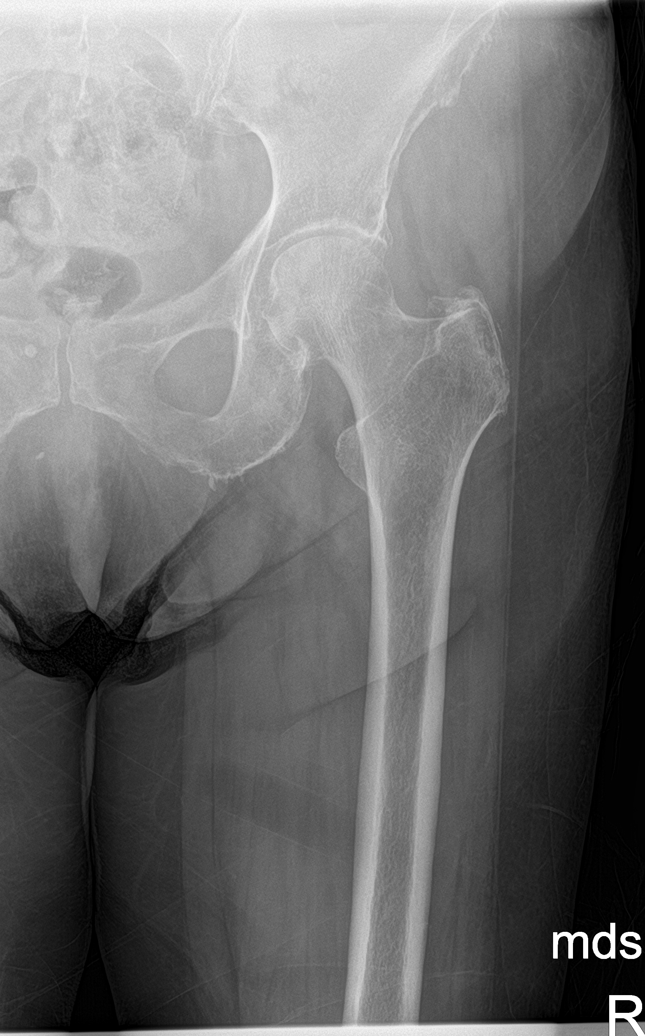

[hip lat (2 of 2)]
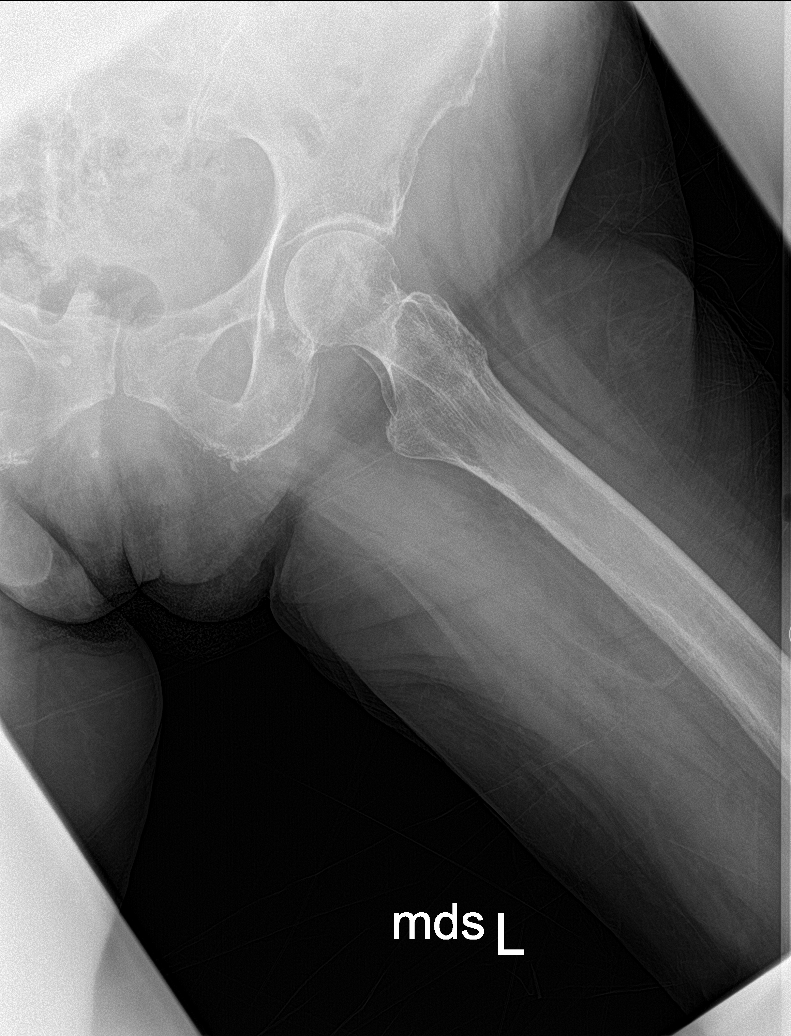

[5 of 5 positions shown; findings below may reference images not displayed]

FINDINGS: Right hip arthroplasty unchanged. Mild degenerate change of the left
hip. No definite left hip fracture or dislocation. Mild focal
irregularity over the right inferior pubic ramus which may represent
a nondisplaced fracture. Degenerate change of the spine.
IMPRESSION: No acute left hip findings.

Focal cortical regularity over the right inferior pubic ramus as
this may represent a nondisplaced fracture. Consider CT for further
evaluation.

Right hip arthroplasty unchanged.

## 2018-09-13 IMAGING — CT CT PELVIS W/O CM
2 of 4 series · 17 of 46 positions shown, 19 images · non-contrast
Comparison: Bilateral hip x-rays from same day.

CLINICAL DATA: Right hip pain after fall.  Evaluate for fracture.

EXAM:
CT PELVIS WITHOUT CONTRAST
TECHNIQUE: Multidetector CT imaging of the pelvis was performed following the
standard protocol without intravenous contrast.

[Series 3: pelvis 2.0 st · axial · 0.72mm/px · z∈[-1070,-782]mm · 14 of 158 slices shown, 16 images]
[im 7/158  soft-tissue]
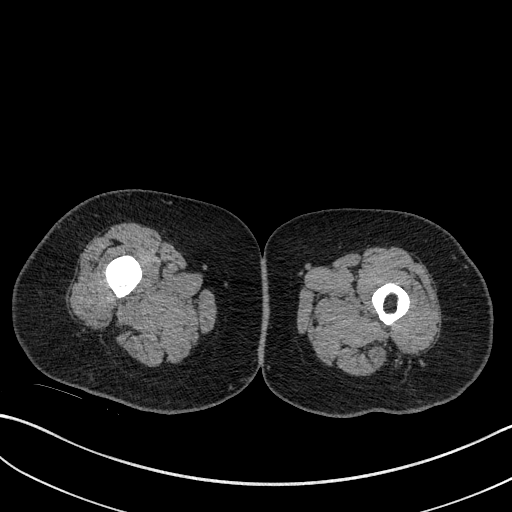
[im 7/158  bone]
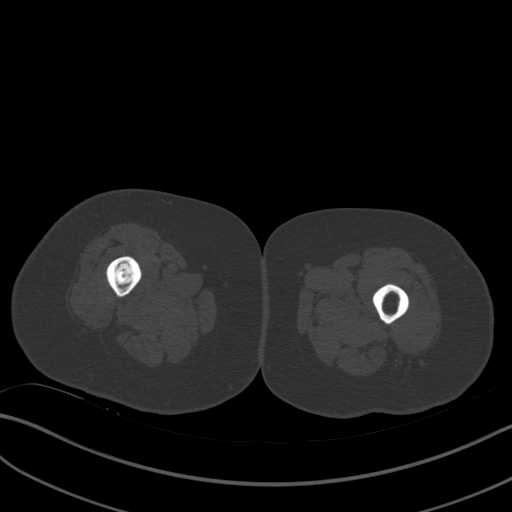
[im 20/158  soft-tissue]
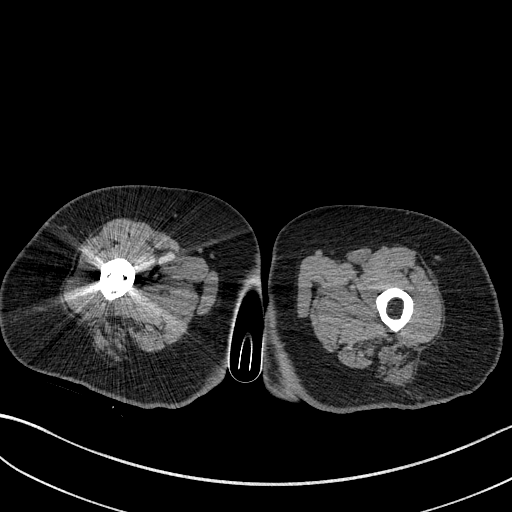
[im 33/158  soft-tissue]
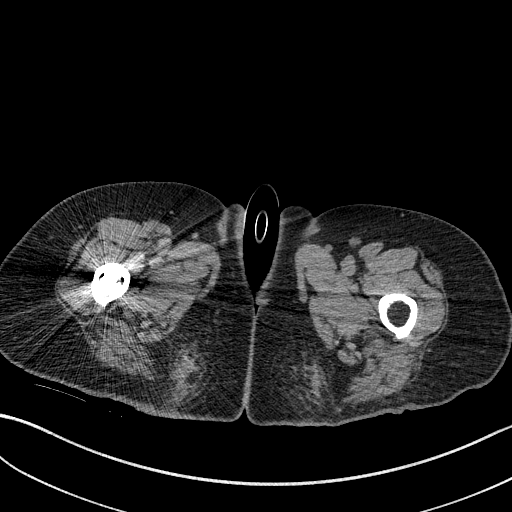
[im 40/158  soft-tissue]
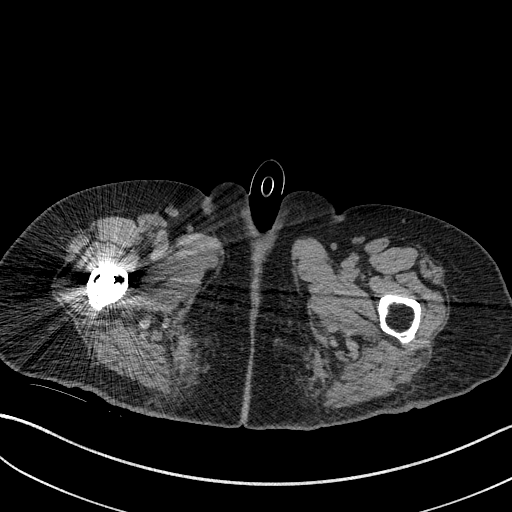
[im 53/158  soft-tissue]
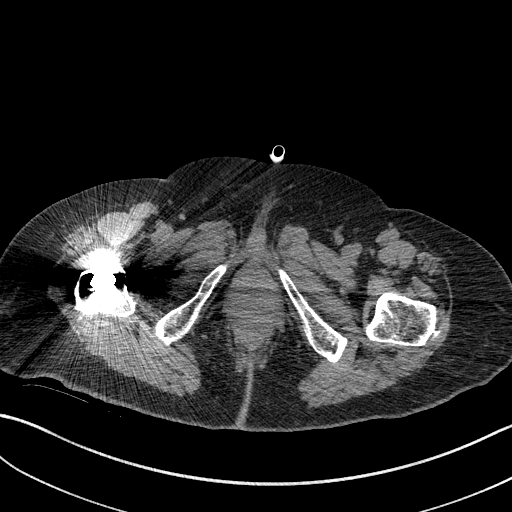
[im 66/158  soft-tissue]
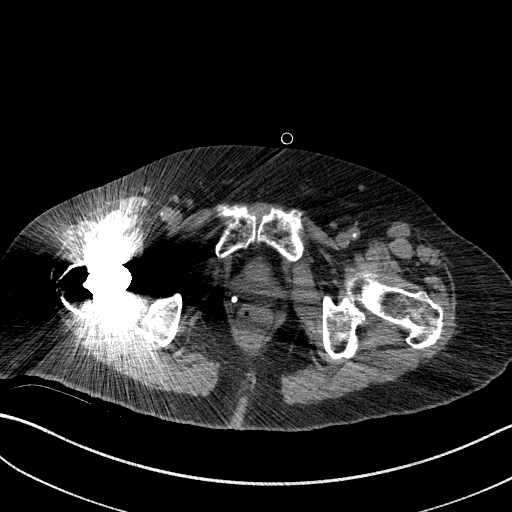
[im 72/158  soft-tissue]
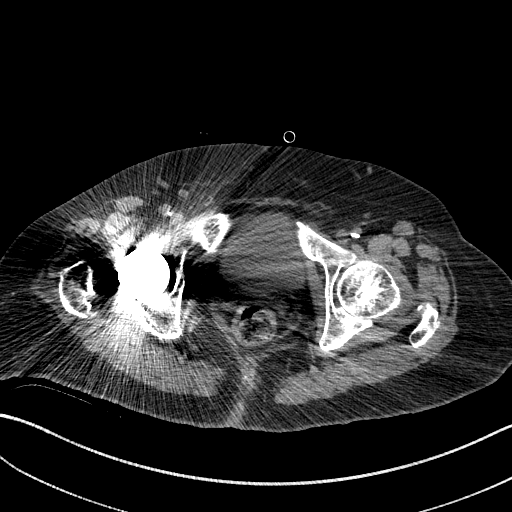
[im 86/158  soft-tissue]
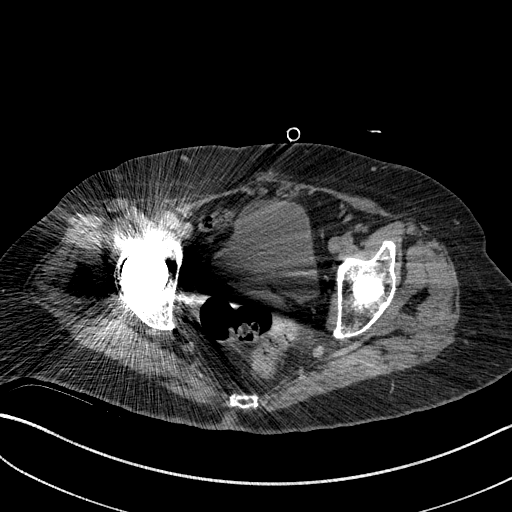
[im 92/158  soft-tissue]
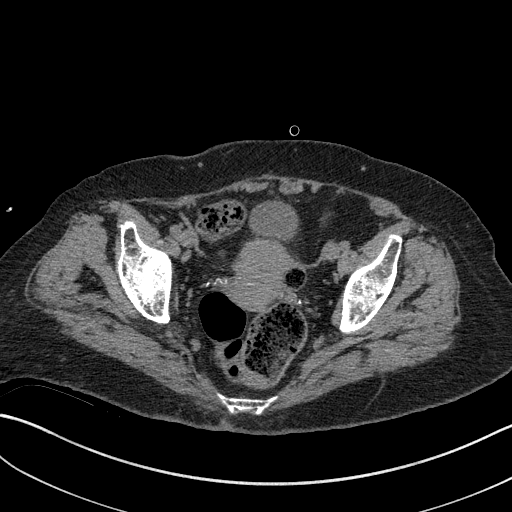
[im 92/158  bone]
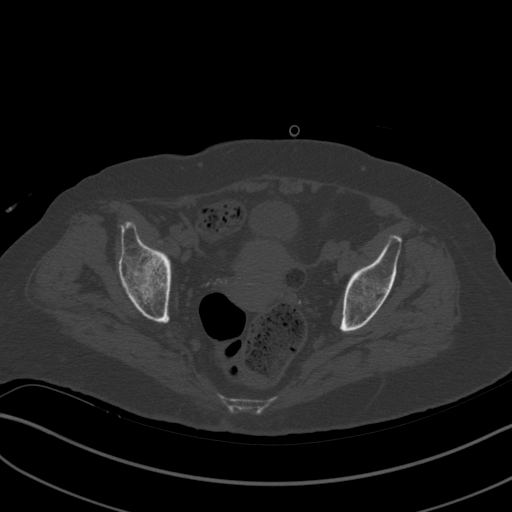
[im 105/158  soft-tissue]
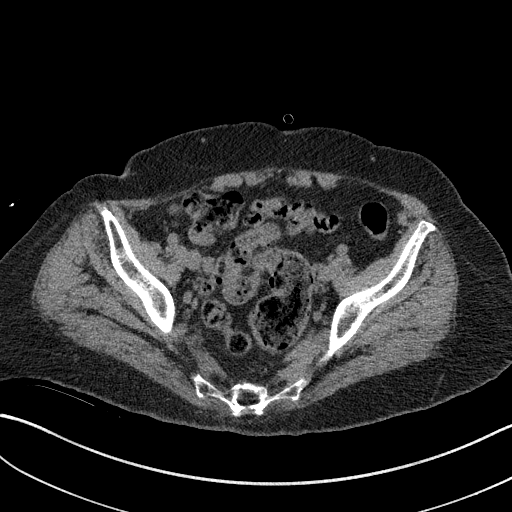
[im 118/158  soft-tissue]
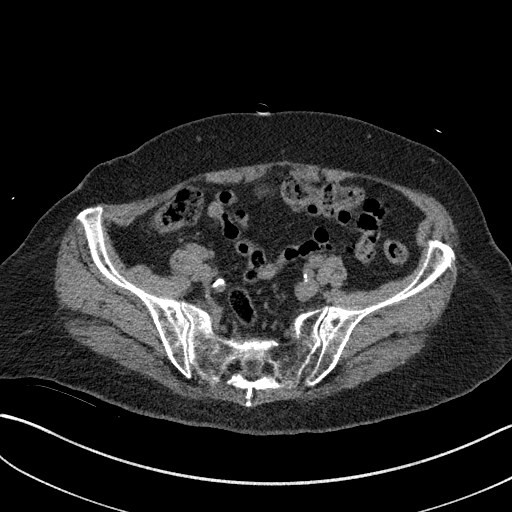
[im 125/158  soft-tissue]
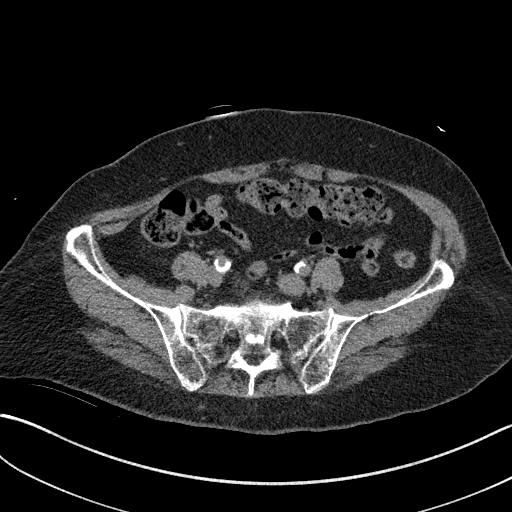
[im 138/158  soft-tissue]
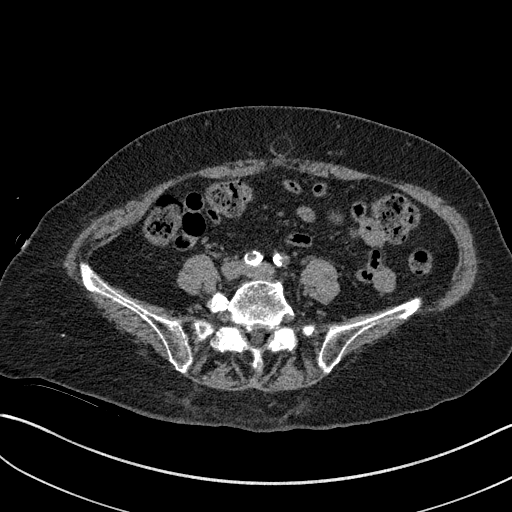
[im 151/158  soft-tissue]
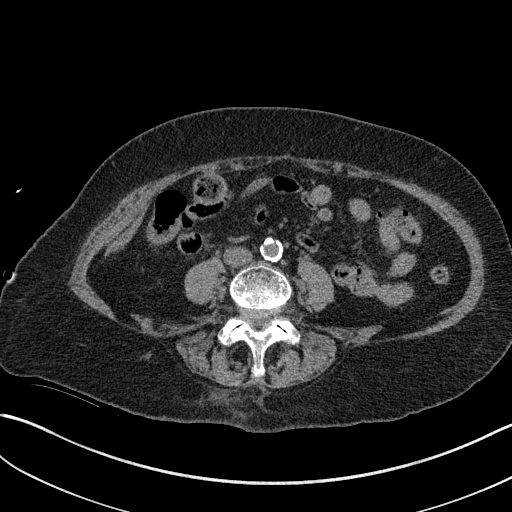

[Series 8: coronal st · coronal · 0.62mm/px · 3 of 93 slices shown]
[im 31/93  soft-tissue]
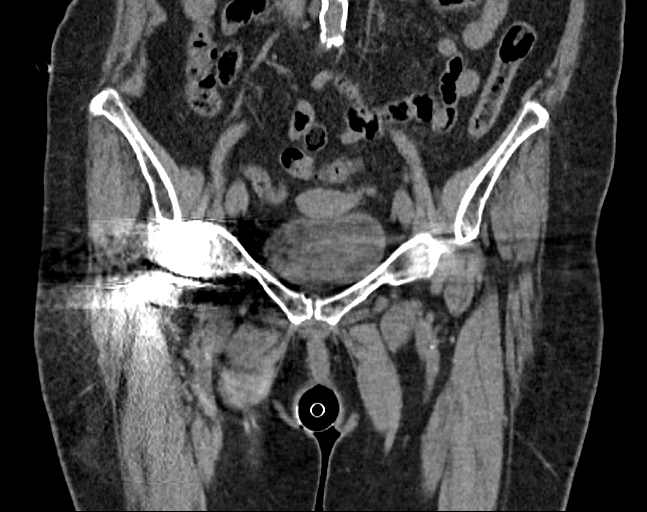
[im 41/93  soft-tissue]
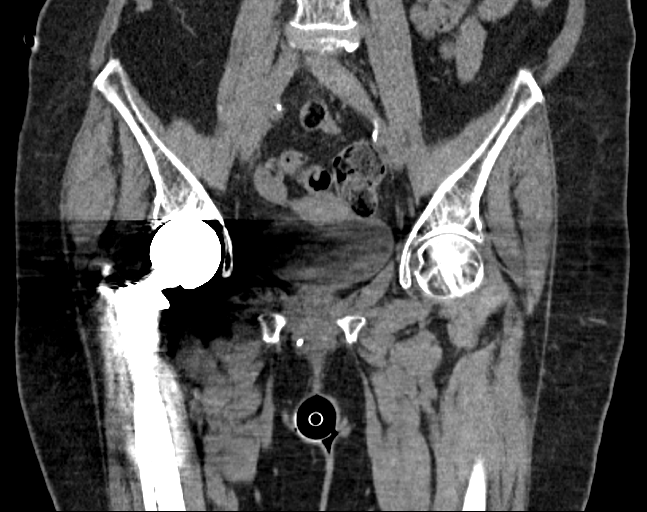
[im 52/93  soft-tissue]
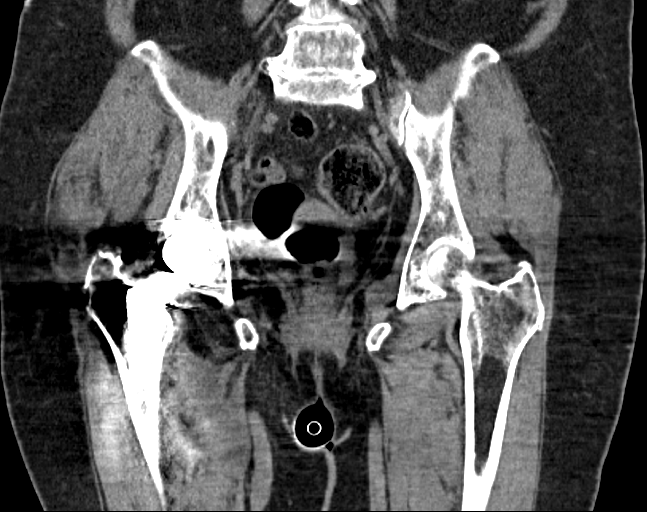

[17 of 46 positions shown; findings below may reference images not displayed]

FINDINGS: Bones/Joint/Cartilage

Prior right hip hemiarthroplasty. No hardware failure or loosening.
Acute, nondisplaced fracture of the right inferior pubic ramus. No
additional fracture seen. Normal alignment. No joint effusion. Mild
left hip joint space narrowing. Advanced degenerative changes at
L5-S1.

Ligaments

Ligaments are suboptimally evaluated by CT.

Muscles and Tendons
Atrophy of the right piriformis muscle.

Soft tissue
No fluid collection or hematoma.  No soft tissue mass.
IMPRESSION: 1. Acute, nondisplaced fracture of the right inferior pubic ramus.
2. Prior right hip hemiarthroplasty without evidence of hardware
complication.

## 2018-09-13 IMAGING — CT CT CERVICAL SPINE W/O CM
4 of 7 series · 15 of 33 positions shown, 16 images · non-contrast
Comparison: CT head and cervical spine dated January 07, 2016.

CLINICAL DATA: Found on the floor.

EXAM:
CT HEAD WITHOUT CONTRAST
CT CERVICAL SPINE WITHOUT CONTRAST
TECHNIQUE: Multidetector CT imaging of the head and cervical spine was
performed following the standard protocol without intravenous
contrast. Multiplanar CT image reconstructions of the cervical spine
were also generated.

[Series 4: head bone · axial · 0.41mm/px · z∈[-68,+26]mm · 4 of 79 slices shown]
[im 16/79  bone]
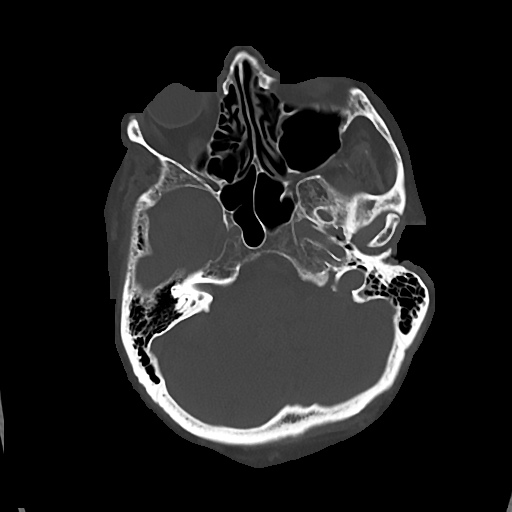
[im 32/79  bone]
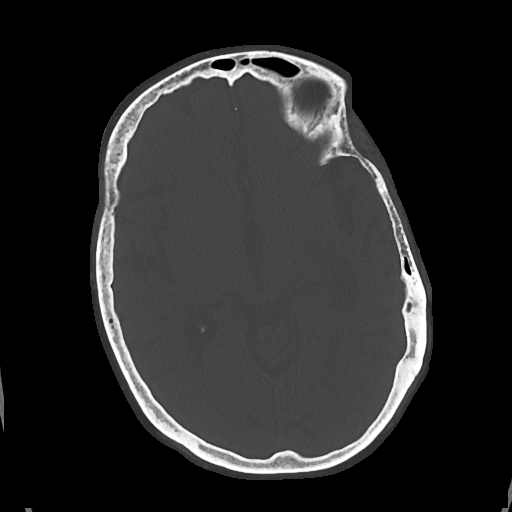
[im 47/79  bone]
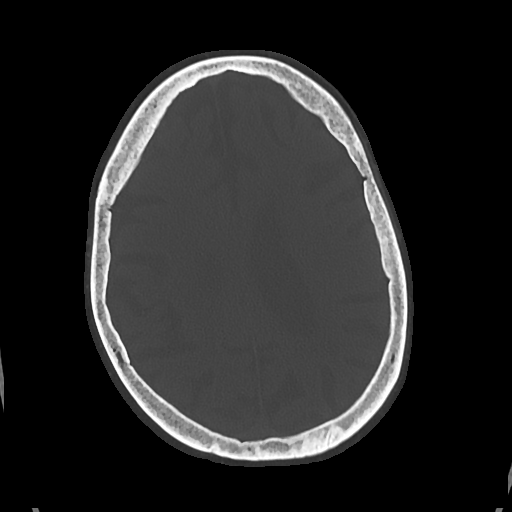
[im 63/79  bone]
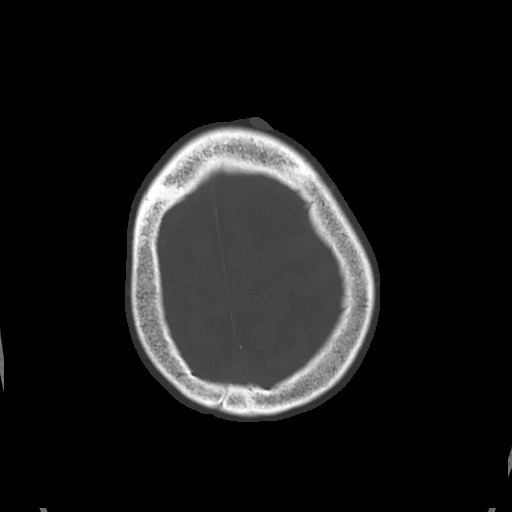

[Series 7: cor soft · coronal · 0.34mm/px · 2 of 67 slices shown]
[im 23/67  bone]
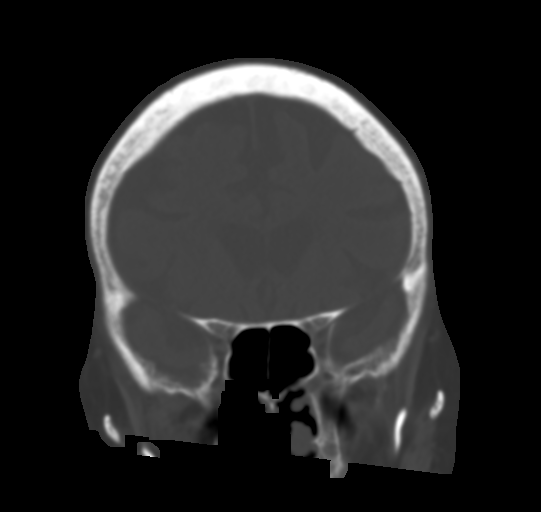
[im 45/67  bone]
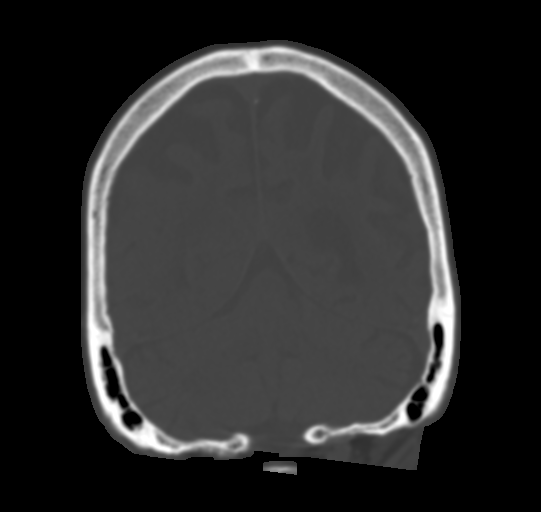

[Series 9: c spine soft · axial · 0.31mm/px · z∈[-188,-84]mm · 4 of 88 slices shown, 5 images]
[im 18/88  soft-tissue]
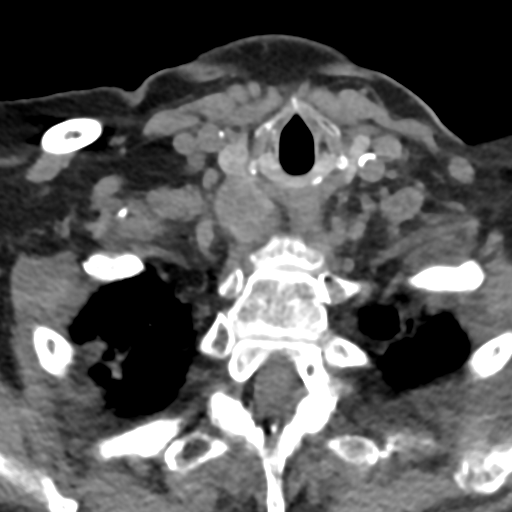
[im 18/88  bone]
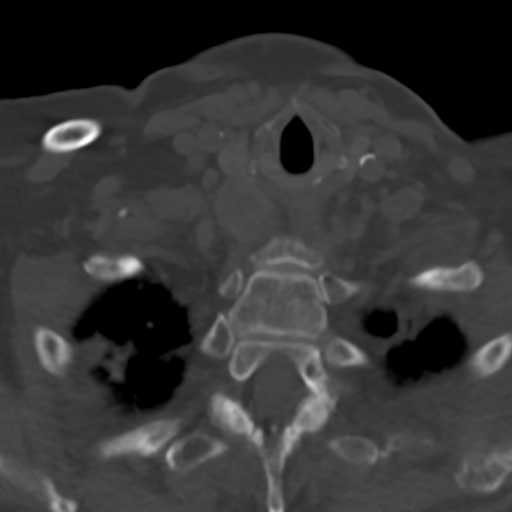
[im 35/88  bone]
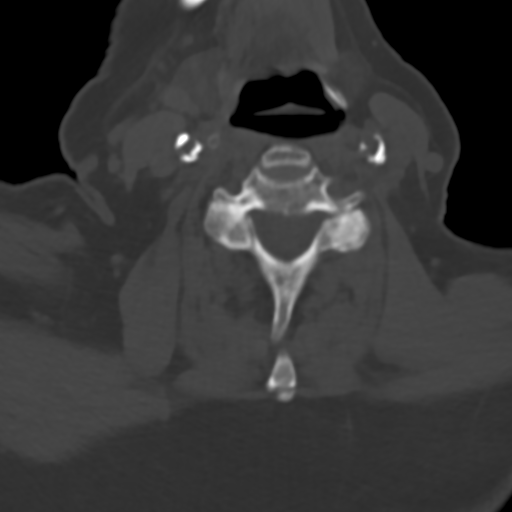
[im 53/88  bone]
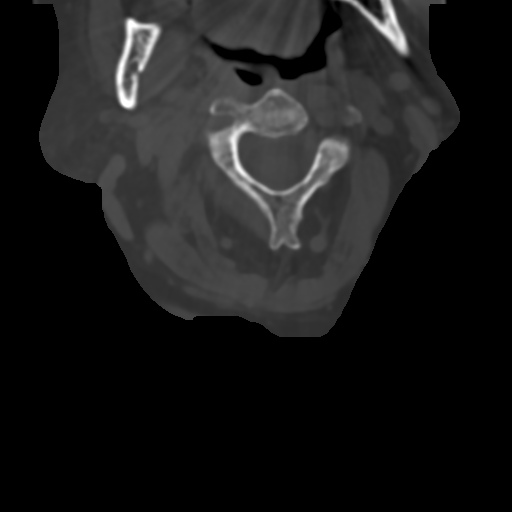
[im 70/88  bone]
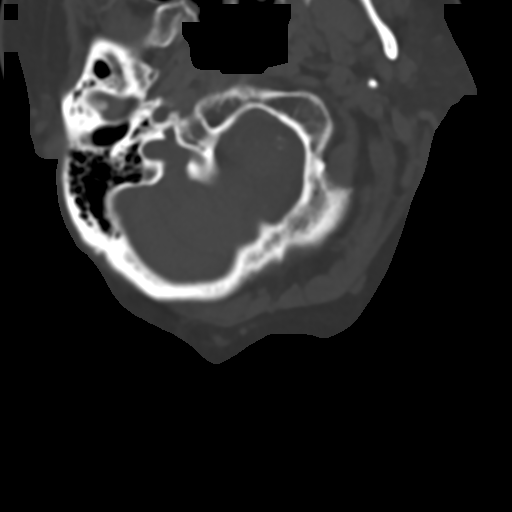

[Series 10: sag bone · sagittal · 0.36mm/px · 5 of 53 slices shown]
[im 9/53  bone]
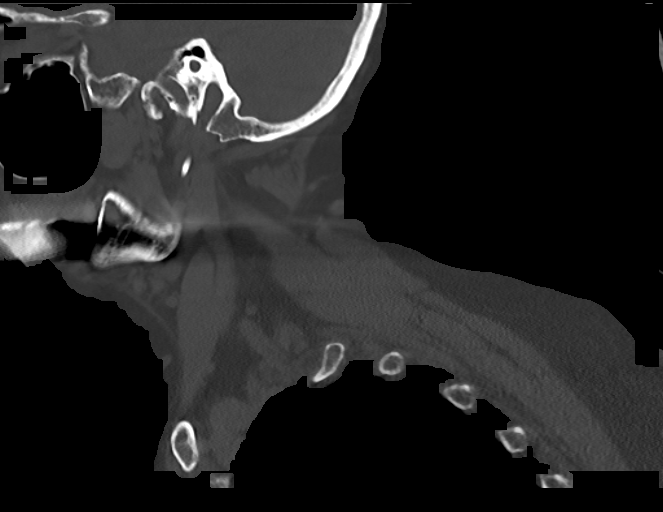
[im 18/53  bone]
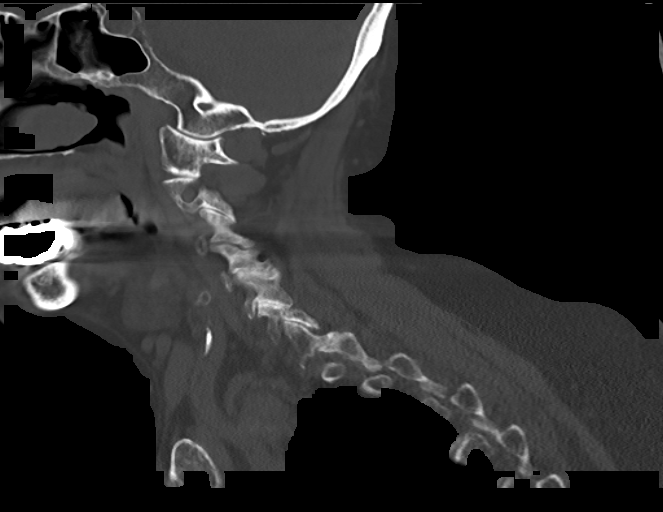
[im 27/53  bone]
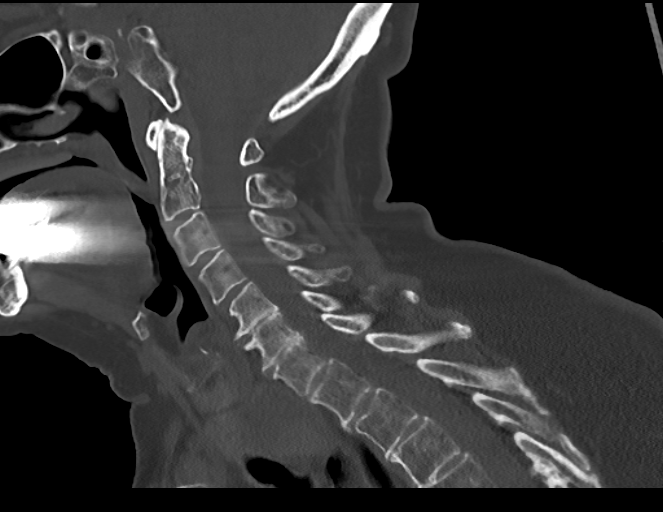
[im 35/53  bone]
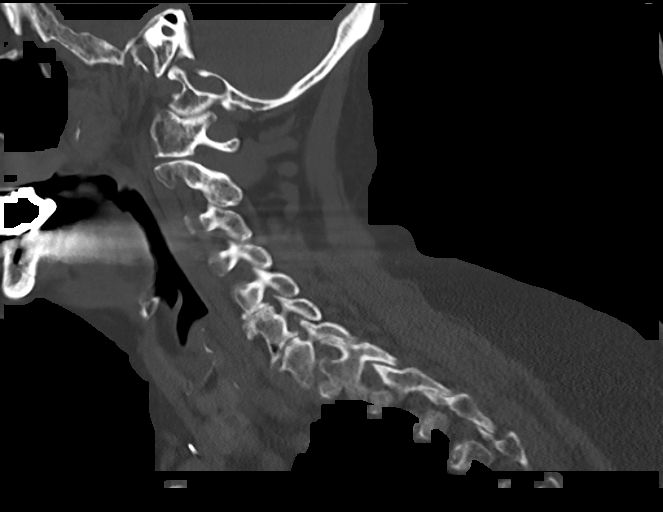
[im 44/53  bone]
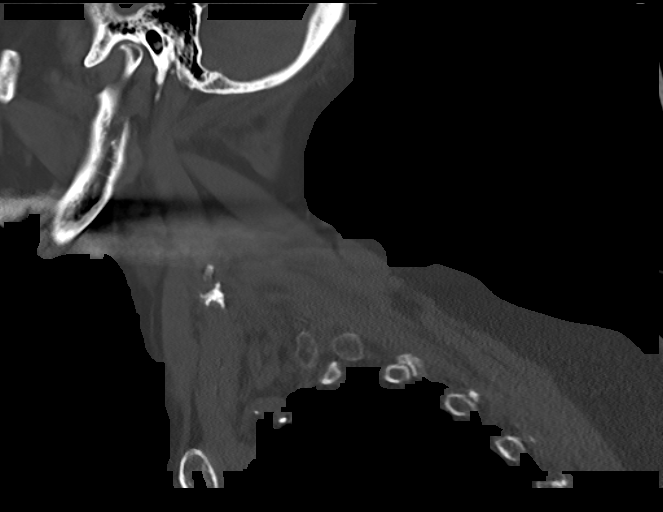

[15 of 33 positions shown; findings below may reference images not displayed]

FINDINGS: CT HEAD FINDINGS

Brain: No evidence of acute infarction, hemorrhage, hydrocephalus,
extra-axial collection or mass lesion/mass effect. Stable atrophy
and chronic microvascular ischemic changes.

Vascular: Calcified atherosclerosis at the skullbase. No hyperdense
vessel.

Skull: Negative for fracture or focal lesion.

Sinuses/Orbits: No acute finding.

Other: Stable left frontal scalp subcutaneous lesion.

CT CERVICAL SPINE FINDINGS

Alignment: No traumatic malalignment.

Skull base and vertebrae: No acute fracture. No primary bone lesion
or focal pathologic process.

Soft tissues and spinal canal: No prevertebral fluid or swelling. No
visible canal hematoma.

Disc levels: Moderate to severe degenerative disc disease and
uncovertebral hypertrophy at C5-C6 and C6-C7, similar to prior
study. Moderate to severe right-sided facet arthropathy throughout
the cervical spine. Multilevel mild to moderate neuroforaminal
stenosis.

Upper chest: Mild centrilobular emphysema.

Other: Slight interval enlargement of the right thyroid mass, now
measuring 3.8 cm, previously 3.5 cm.
IMPRESSION: 1. No acute intracranial abnormality. Stable atrophy and chronic
microvascular ischemic changes.
2. No acute cervical spine fracture. Stable multilevel degenerative
changes.
3. Slight interval enlargement of the right thyroid mass, now
measuring 3.8 cm, previously 3.5 cm. Recommend thyroid ultrasound
and possible tissue sampling if not previously performed.

## 2018-10-18 ENCOUNTER — Encounter

## 2018-10-21 ENCOUNTER — Inpatient Hospital Stay: Admit: 2018-10-21 | Payer: MEDICARE | Attending: Internal Medicine | Primary: Internal Medicine

## 2018-10-21 ENCOUNTER — Inpatient Hospital Stay: Admit: 2018-10-21 | Discharge: 2018-10-21 | Payer: MEDICARE | Primary: Internal Medicine

## 2018-10-21 DIAGNOSIS — R600 Localized edema: Secondary | ICD-10-CM

## 2018-10-21 LAB — METABOLIC PANEL, COMPREHENSIVE
ALT (SGPT): 15 U/L (ref 12–78)
AST (SGOT): 20 U/L (ref 15–37)
Albumin: 3.7 gm/dl (ref 3.4–5.0)
Alk. phosphatase: 61 U/L (ref 45–117)
Anion gap: 8 mmol/L (ref 5–15)
BUN: 11 mg/dl (ref 7–25)
Bilirubin, total: 1.4 mg/dl — ABNORMAL HIGH (ref 0.2–1.0)
CO2: 27 mEq/L (ref 21–32)
Calcium: 9.6 mg/dl (ref 8.5–10.1)
Chloride: 107 mEq/L (ref 98–107)
Creatinine: 0.7 mg/dl (ref 0.6–1.3)
GFR est AA: >60
GFR est non-AA: >60
Glucose: 105 mg/dl (ref 74–106)
Potassium: 2.9 mEq/L — CL (ref 3.5–5.1)
Protein, total: 6.8 gm/dl (ref 6.4–8.2)
Sodium: 142 mEq/L (ref 136–145)

## 2018-10-21 LAB — CBC WITH AUTOMATED DIFF
BASOPHILS: 0.4 % (ref 0–3)
EOSINOPHILS: 1.4 % (ref 0–5)
HCT: 37.1 % (ref 37.0–50.0)
HGB: 13 gm/dl (ref 13.0–17.2)
IMMATURE GRANULOCYTES: 0.1 % (ref 0.0–3.0)
LYMPHOCYTES: 25 % — ABNORMAL LOW (ref 28–48)
MCH: 33.8 pg (ref 25.4–34.6)
MCHC: 35 gm/dl (ref 30.0–36.0)
MCV: 96.4 fL (ref 80.0–98.0)
MONOCYTES: 9 % (ref 1–13)
MPV: 11.4 fL — ABNORMAL HIGH (ref 6.0–10.0)
NEUTROPHILS: 64.1 % — ABNORMAL HIGH (ref 34–64)
NRBC: 0 (ref 0–0)
PLATELET: 213 10*3/uL (ref 140–450)
RBC: 3.85 M/uL (ref 3.60–5.20)
RDW-SD: 48.5 — ABNORMAL HIGH (ref 36.4–46.3)
WBC: 7.1 10*3/uL (ref 4.0–11.0)

## 2018-10-21 LAB — URINALYSIS W/ RFLX MICROSCOPIC
Bilirubin, Urine: NEGATIVE
Bilirubin: NEGATIVE
Blood, Urine: NEGATIVE
Blood: NEGATIVE
Glucose, Ur: NEGATIVE mg/dl
Glucose: NEGATIVE mg/dl
Ketone: NEGATIVE mg/dl
Ketones, Urine: NEGATIVE mg/dl
Leukocyte Esterase, Urine: NEGATIVE
Leukocyte Esterase: NEGATIVE
Nitrite, Urine: NEGATIVE
Nitrites: NEGATIVE
Protein, UA: NEGATIVE mg/dl
Protein: NEGATIVE mg/dl
Specific Gravity, UA: 1.01 (ref 1.005–1.030)
Specific gravity: 1.01 (ref 1.005–1.030)
Urobilinogen, UA, POCT: 0.2 mg/dl (ref 0.0–1.0)
Urobilinogen: 0.2 mg/dl (ref 0.0–1.0)
pH (UA): 7.5 (ref 5.0–9.0)
pH, UA: 7.5 (ref 5.0–9.0)

## 2018-10-21 LAB — MAGNESIUM
Magnesium: 1.7 mg/dl (ref 1.6–2.6)
Magnesium: 1.7 mg/dl (ref 1.6–2.6)

## 2018-10-21 LAB — LIPID PANEL
CHOL/HDL Ratio: 3.2 Ratio (ref 0.0–4.4)
Chol/HDL Ratio: 3.2 Ratio (ref 0.0–4.4)
Cholesterol, Total: 180 mg/dl (ref 140–199)
Cholesterol, total: 180 mg/dl (ref 140–199)
HDL Cholesterol: 56 mg/dl (ref 40–96)
HDL: 56 mg/dl (ref 40–96)
LDL Calculated: 105 mg/dl (ref 0–130)
LDL, calculated: 105 mg/dl (ref 0–130)
Triglyceride: 95 mg/dl (ref 29–150)
Triglycerides: 95 mg/dl (ref 29–150)

## 2018-10-21 LAB — TSH 3RD GENERATION
TSH: 0.195 u[IU]/mL — ABNORMAL LOW (ref 0.358–3.740)
TSH: 0.195 u[IU]/mL — ABNORMAL LOW (ref 0.358–3.740)

## 2018-10-21 LAB — COMPREHENSIVE METABOLIC PANEL
ALT: 15 U/L (ref 12–78)
AST: 20 U/L (ref 15–37)
Albumin: 3.7 gm/dl (ref 3.4–5.0)
Alkaline Phosphatase: 61 U/L (ref 45–117)
Anion Gap: 8 mmol/L (ref 5–15)
BUN: 11 mg/dl (ref 7–25)
CO2: 27 mEq/L (ref 21–32)
Calcium: 9.6 mg/dl (ref 8.5–10.1)
Chloride: 107 mEq/L (ref 98–107)
Creatinine: 0.7 mg/dl (ref 0.6–1.3)
EGFR IF NonAfrican American: >60
GFR African American: >60
Glucose: 105 mg/dl (ref 74–106)
Potassium: 2.9 mEq/L — CL (ref 3.5–5.1)
Sodium: 142 mEq/L (ref 136–145)
Total Bilirubin: 1.4 mg/dl — ABNORMAL HIGH (ref 0.2–1.0)
Total Protein: 6.8 gm/dl (ref 6.4–8.2)

## 2018-10-21 LAB — CBC WITH AUTO DIFFERENTIAL
Basophils %: 0.4 % (ref 0–3)
Eosinophils %: 1.4 % (ref 0–5)
Hematocrit: 37.1 % (ref 37.0–50.0)
Hemoglobin: 13 gm/dl (ref 13.0–17.2)
Immature Granulocytes: 0.1 % (ref 0.0–3.0)
Lymphocytes %: 25 % — ABNORMAL LOW (ref 28–48)
MCH: 33.8 pg (ref 25.4–34.6)
MCHC: 35 gm/dl (ref 30.0–36.0)
MCV: 96.4 fL (ref 80.0–98.0)
MPV: 11.4 fL — ABNORMAL HIGH (ref 6.0–10.0)
Monocytes %: 9 % (ref 1–13)
Neutrophils %: 64.1 % — ABNORMAL HIGH (ref 34–64)
Nucleated RBCs: 0 (ref 0–0)
Platelets: 213 10*3/uL (ref 140–450)
RBC: 3.85 M/uL (ref 3.60–5.20)
RDW-SD: 48.5 — ABNORMAL HIGH (ref 36.4–46.3)
WBC: 7.1 10*3/uL (ref 4.0–11.0)

## 2018-10-22 LAB — HEMOGLOBIN A1C W/O EAG
Hemoglobin A1C: 5.8 % — ABNORMAL HIGH (ref 4.2–5.6)
Hemoglobin A1c: 5.8 % — ABNORMAL HIGH (ref 4.2–5.6)

## 2020-09-06 ENCOUNTER — Inpatient Hospital Stay: Admit: 2020-09-06 | Discharge: 2020-09-06 | Payer: MEDICARE | Primary: Internal Medicine

## 2020-09-06 ENCOUNTER — Encounter

## 2020-09-06 ENCOUNTER — Inpatient Hospital Stay: Admit: 2020-09-06 | Payer: MEDICARE | Primary: Internal Medicine

## 2020-09-06 DIAGNOSIS — R509 Fever, unspecified: Secondary | ICD-10-CM

## 2020-09-06 LAB — TSH 3RD GENERATION
TSH: 4.207 u[IU]/mL (ref 0.550–4.780)
TSH: 4.207 u[IU]/mL (ref 0.550–4.780)

## 2020-09-06 LAB — LIPID PANEL
CHOL/HDL Ratio: 2.4 Ratio (ref 0.0–4.4)
Chol/HDL Ratio: 2.4 Ratio (ref 0.0–4.4)
Cholesterol, Total: 113 mg/dl (ref 0–199)
Cholesterol, total: 113 mg/dl (ref 0–199)
HDL Cholesterol: 48 mg/dl (ref 40–60)
HDL: 48 mg/dl (ref 40–60)
LDL Calculated: 50 mg/dl (ref 0–130)
LDL, calculated: 50 mg/dl (ref 0–130)
Triglyceride: 75 mg/dl (ref 0–150)
Triglycerides: 75 mg/dl (ref 0–150)

## 2020-09-06 LAB — LIPASE
Lipase: 42 U/L (ref 12–53)
Lipase: 42 U/L (ref 12–53)

## 2020-09-06 LAB — URINALYSIS W/ RFLX MICROSCOPIC
Bilirubin, Urine: NEGATIVE
Bilirubin: NEGATIVE
Blood, Urine: NEGATIVE
Blood: NEGATIVE
Glucose, Ur: NEGATIVE mg/dl
Glucose: NEGATIVE mg/dl
Ketone: NEGATIVE mg/dl
Ketones, Urine: NEGATIVE mg/dl
Leukocyte Esterase, Urine: NEGATIVE
Leukocyte Esterase: NEGATIVE
Nitrite, Urine: NEGATIVE
Nitrites: NEGATIVE
Specific Gravity, UA: >=1.03 (ref 1.005–1.030)
Specific gravity: >=1.03 (ref 1.005–1.030)
Urobilinogen, UA, POCT: 1 mg/dl (ref 0.0–1.0)
Urobilinogen: 1 mg/dl (ref 0.0–1.0)
pH (UA): 6 (ref 5.0–9.0)
pH, UA: 6 (ref 5.0–9.0)

## 2020-09-06 LAB — METABOLIC PANEL, COMPREHENSIVE
ALT (SGPT): 11 U/L (ref 10–49)
AST (SGOT): 33 U/L (ref 0.0–33.9)
Albumin: 3.8 gm/dl (ref 3.4–5.0)
Alk. phosphatase: 85 U/L (ref 46–116)
Anion gap: 9 mmol/L (ref 5–15)
BUN: 12 mg/dl (ref 9–23)
Bilirubin, total: 3 mg/dl — ABNORMAL HIGH (ref 0.30–1.20)
CO2: 28 mEq/L (ref 20–31)
Calcium: 9.7 mg/dl (ref 8.7–10.4)
Chloride: 104 mEq/L (ref 98–107)
Creatinine: 0.73 mg/dl (ref 0.55–1.02)
GFR est AA: >60
GFR est non-AA: >60
Glucose: 131 mg/dl — ABNORMAL HIGH (ref 74–106)
Potassium: 2.8 mEq/L — CL (ref 3.4–4.5)
Protein, total: 6.6 gm/dl (ref 5.7–8.2)
Sodium: 141 mEq/L (ref 136–145)

## 2020-09-06 LAB — CBC WITH AUTOMATED DIFF
BASOPHILS: 0.5 % (ref 0–3)
EOSINOPHILS: 0 % (ref 0–5)
HCT: 40.5 % (ref 37.0–50.0)
HGB: 13.7 gm/dl (ref 13.0–17.2)
IMMATURE GRANULOCYTES: 0.4 % (ref 0.0–3.0)
LYMPHOCYTES: 18.9 % — ABNORMAL LOW (ref 28–48)
MCH: 34.3 pg (ref 25.4–34.6)
MCHC: 33.8 gm/dl (ref 30.0–36.0)
MCV: 101.5 fL — ABNORMAL HIGH (ref 80.0–98.0)
MONOCYTES: 8.2 % (ref 1–13)
MPV: 10.9 fL — ABNORMAL HIGH (ref 6.0–10.0)
NEUTROPHILS: 72 % — ABNORMAL HIGH (ref 34–64)
NRBC: 0 (ref 0–0)
PLATELET: 201 10*3/uL (ref 140–450)
RBC: 3.99 M/uL (ref 3.60–5.20)
RDW-SD: 52.5 — ABNORMAL HIGH (ref 36.4–46.3)
WBC: 8.1 10*3/uL (ref 4.0–11.0)

## 2020-09-06 LAB — POC URINE MICROSCOPIC

## 2020-09-06 LAB — MAGNESIUM
Magnesium: 1.5 mg/dL — ABNORMAL LOW (ref 1.6–2.6)
Magnesium: 1.5 mg/dL — ABNORMAL LOW (ref 1.6–2.6)

## 2020-09-06 LAB — COMPREHENSIVE METABOLIC PANEL
ALT: 11 U/L (ref 10–49)
AST: 33 U/L (ref 0.0–33.9)
Albumin: 3.8 gm/dl (ref 3.4–5.0)
Alkaline Phosphatase: 85 U/L (ref 46–116)
Anion Gap: 9 mmol/L (ref 5–15)
BUN: 12 mg/dl (ref 9–23)
CO2: 28 mEq/L (ref 20–31)
Calcium: 9.7 mg/dl (ref 8.7–10.4)
Chloride: 104 mEq/L (ref 98–107)
Creatinine: 0.73 mg/dl (ref 0.55–1.02)
EGFR IF NonAfrican American: >60
GFR African American: >60
Glucose: 131 mg/dl — ABNORMAL HIGH (ref 74–106)
Potassium: 2.8 mEq/L — CL (ref 3.4–4.5)
Sodium: 141 mEq/L (ref 136–145)
Total Bilirubin: 3 mg/dl — ABNORMAL HIGH (ref 0.30–1.20)
Total Protein: 6.6 gm/dl (ref 5.7–8.2)

## 2020-09-06 LAB — CBC WITH AUTO DIFFERENTIAL
Basophils %: 0.5 % (ref 0–3)
Eosinophils %: 0 % (ref 0–5)
Hematocrit: 40.5 % (ref 37.0–50.0)
Hemoglobin: 13.7 gm/dl (ref 13.0–17.2)
Immature Granulocytes: 0.4 % (ref 0.0–3.0)
Lymphocytes %: 18.9 % — ABNORMAL LOW (ref 28–48)
MCH: 34.3 pg (ref 25.4–34.6)
MCHC: 33.8 gm/dl (ref 30.0–36.0)
MCV: 101.5 fL — ABNORMAL HIGH (ref 80.0–98.0)
MPV: 10.9 fL — ABNORMAL HIGH (ref 6.0–10.0)
Monocytes %: 8.2 % (ref 1–13)
Neutrophils %: 72 % — ABNORMAL HIGH (ref 34–64)
Nucleated RBCs: 0 (ref 0–0)
Platelets: 201 10*3/uL (ref 140–450)
RBC: 3.99 M/uL (ref 3.60–5.20)
RDW-SD: 52.5 — ABNORMAL HIGH (ref 36.4–46.3)
WBC: 8.1 10*3/uL (ref 4.0–11.0)

## 2020-09-08 LAB — CULTURE, URINE
CULTURE RESULT: NO GROWTH
Culture Result: NO GROWTH

## 2020-09-13 ENCOUNTER — Encounter

## 2020-09-13 ENCOUNTER — Inpatient Hospital Stay: Admit: 2020-09-13 | Payer: MEDICARE | Primary: Internal Medicine

## 2020-09-13 DIAGNOSIS — M25551 Pain in right hip: Secondary | ICD-10-CM

## 2020-09-13 DIAGNOSIS — M47819 Spondylosis without myelopathy or radiculopathy, site unspecified: Secondary | ICD-10-CM

## 2020-09-17 ENCOUNTER — Encounter

## 2020-09-24 ENCOUNTER — Inpatient Hospital Stay: Admit: 2020-09-24 | Discharge: 2020-09-24 | Payer: MEDICARE | Primary: Internal Medicine

## 2020-09-24 DIAGNOSIS — R410 Disorientation, unspecified: Secondary | ICD-10-CM

## 2020-09-24 LAB — CBC WITH AUTOMATED DIFF
BASOPHILS: 0.3 % (ref 0–3)
EOSINOPHILS: 2.7 % (ref 0–5)
HCT: 37.7 % (ref 37.0–50.0)
HGB: 12.1 gm/dl — ABNORMAL LOW (ref 13.0–17.2)
IMMATURE GRANULOCYTES: 0.3 % (ref 0.0–3.0)
LYMPHOCYTES: 24.9 % — ABNORMAL LOW (ref 28–48)
MCH: 34 pg (ref 25.4–34.6)
MCHC: 32.1 gm/dl (ref 30.0–36.0)
MCV: 105.9 fL — ABNORMAL HIGH (ref 80.0–98.0)
MONOCYTES: 11.3 % (ref 1–13)
MPV: 10.1 fL — ABNORMAL HIGH (ref 6.0–10.0)
NEUTROPHILS: 60.5 % (ref 34–64)
NRBC: 0 (ref 0–0)
PLATELET: 267 10*3/uL (ref 140–450)
RBC: 3.56 M/uL — ABNORMAL LOW (ref 3.60–5.20)
RDW-SD: 55.8 — ABNORMAL HIGH (ref 36.4–46.3)
WBC: 6.6 10*3/uL (ref 4.0–11.0)

## 2020-09-24 LAB — CBC WITH AUTO DIFFERENTIAL
Basophils %: 0.3 % (ref 0–3)
Eosinophils %: 2.7 % (ref 0–5)
Hematocrit: 37.7 % (ref 37.0–50.0)
Hemoglobin: 12.1 gm/dl — ABNORMAL LOW (ref 13.0–17.2)
Immature Granulocytes: 0.3 % (ref 0.0–3.0)
Lymphocytes %: 24.9 % — ABNORMAL LOW (ref 28–48)
MCH: 34 pg (ref 25.4–34.6)
MCHC: 32.1 gm/dl (ref 30.0–36.0)
MCV: 105.9 fL — ABNORMAL HIGH (ref 80.0–98.0)
MPV: 10.1 fL — ABNORMAL HIGH (ref 6.0–10.0)
Monocytes %: 11.3 % (ref 1–13)
Neutrophils %: 60.5 % (ref 34–64)
Nucleated RBCs: 0 (ref 0–0)
Platelets: 267 10*3/uL (ref 140–450)
RBC: 3.56 M/uL — ABNORMAL LOW (ref 3.60–5.20)
RDW-SD: 55.8 — ABNORMAL HIGH (ref 36.4–46.3)
WBC: 6.6 10*3/uL (ref 4.0–11.0)

## 2020-09-25 LAB — URINALYSIS W/ RFLX MICROSCOPIC
Bilirubin, Urine: NEGATIVE
Bilirubin: NEGATIVE
Blood, Urine: NEGATIVE
Blood: NEGATIVE
Glucose, Ur: NEGATIVE mg/dl
Glucose: NEGATIVE mg/dl
Ketone: NEGATIVE mg/dl
Ketones, Urine: NEGATIVE mg/dl
Leukocyte Esterase, Urine: NEGATIVE
Leukocyte Esterase: NEGATIVE
Nitrite, Urine: NEGATIVE
Nitrites: NEGATIVE
Specific Gravity, UA: 1.01 (ref 1.005–1.030)
Specific gravity: 1.01 (ref 1.005–1.030)
Urobilinogen, UA, POCT: 0.2 mg/dl (ref 0.0–1.0)
Urobilinogen: 0.2 mg/dl (ref 0.0–1.0)
pH (UA): 7 (ref 5.0–9.0)
pH, UA: 7 (ref 5.0–9.0)

## 2020-09-25 LAB — METABOLIC PANEL, COMPREHENSIVE
ALT (SGPT): 13 U/L (ref 10–49)
AST (SGOT): 37 U/L — ABNORMAL HIGH (ref 0.0–33.9)
Albumin: 3.6 gm/dl (ref 3.4–5.0)
Alk. phosphatase: 119 U/L — ABNORMAL HIGH (ref 46–116)
Anion gap: 8 mmol/L (ref 5–15)
BUN: 14 mg/dl (ref 9–23)
Bilirubin, total: 1.2 mg/dl (ref 0.30–1.20)
CO2: 28 mEq/L (ref 20–31)
Calcium: 9.4 mg/dl (ref 8.7–10.4)
Chloride: 104 mEq/L (ref 98–107)
Creatinine: 0.89 mg/dl (ref 0.55–1.02)
GFR est AA: >60
GFR est non-AA: >60
Glucose: 86 mg/dl (ref 74–106)
Potassium: 4 mEq/L (ref 3.4–4.5)
Protein, total: 6.2 gm/dl (ref 5.7–8.2)
Sodium: 140 mEq/L (ref 136–145)

## 2020-09-25 LAB — VITAMIN D, 25 HYDROXY: Vitamin D, 25-OH, Total: 97.9 ng/ml (ref 30.0–100.0)

## 2020-09-25 LAB — BILIRUBIN, DIRECT
Bilirubin, Direct: 0.5 mg/dl — ABNORMAL HIGH (ref 0.0–0.3)
Bilirubin, direct: 0.5 mg/dl — ABNORMAL HIGH (ref 0.0–0.3)

## 2020-09-25 LAB — POC URINE MICROSCOPIC

## 2020-09-25 LAB — AMMONIA
Ammonia, plasma: 106 umol/L — ABNORMAL HIGH (ref 11.2–31.7)
Ammonia: 106 umol/L — ABNORMAL HIGH (ref 11.2–31.7)

## 2020-09-25 LAB — SED RATE (ESR): Sed rate (ESR): 27 mm/Hr (ref 0–30)

## 2020-09-25 LAB — C REACTIVE PROTEIN, QT: C-Reactive protein: 12 mg/L — ABNORMAL HIGH (ref 0.00–9.90)

## 2020-09-25 LAB — VITAMIN B12
VITAMIN B12: 580 pg/ml (ref 211–911)
Vitamin B12: 580 pg/ml (ref 211–911)

## 2020-09-25 LAB — COMPREHENSIVE METABOLIC PANEL
ALT: 13 U/L (ref 10–49)
AST: 37 U/L — ABNORMAL HIGH (ref 0.0–33.9)
Albumin: 3.6 gm/dl (ref 3.4–5.0)
Alkaline Phosphatase: 119 U/L — ABNORMAL HIGH (ref 46–116)
Anion Gap: 8 mmol/L (ref 5–15)
BUN: 14 mg/dl (ref 9–23)
CO2: 28 mEq/L (ref 20–31)
Calcium: 9.4 mg/dl (ref 8.7–10.4)
Chloride: 104 mEq/L (ref 98–107)
Creatinine: 0.89 mg/dl (ref 0.55–1.02)
EGFR IF NonAfrican American: >60
GFR African American: >60
Glucose: 86 mg/dl (ref 74–106)
Potassium: 4 mEq/L (ref 3.4–4.5)
Sodium: 140 mEq/L (ref 136–145)
Total Bilirubin: 1.2 mg/dl (ref 0.30–1.20)
Total Protein: 6.2 gm/dl (ref 5.7–8.2)

## 2020-09-25 LAB — C-REACTIVE PROTEIN: CRP: 12 mg/L — ABNORMAL HIGH (ref 0.00–9.90)

## 2020-09-25 LAB — SEDIMENTATION RATE: Sed Rate: 27 mm/Hr (ref 0–30)

## 2020-09-25 LAB — VITAMIN D 25 HYDROXY: Vitamin D, 25-OH, Total: 97.9 ng/ml (ref 30.0–100.0)

## 2020-09-26 ENCOUNTER — Encounter

## 2020-09-26 LAB — CULTURE, URINE
CULTURE RESULT: 40000 — AB
Culture Result: 40000 — AB

## 2020-09-27 ENCOUNTER — Inpatient Hospital Stay: Admit: 2020-09-27 | Payer: MEDICARE | Attending: Internal Medicine | Primary: Internal Medicine

## 2020-09-27 DIAGNOSIS — R410 Disorientation, unspecified: Secondary | ICD-10-CM

## 2020-09-27 LAB — HAPTOGLOBIN
HAPTOGLOBIN: 79 mg/dL (ref 43–212)
Haptoglobin: 79 mg/dL (ref 43–212)

## 2020-09-28 LAB — TSH 3RD GENERATION
TSH: 10.715 u[IU]/mL — ABNORMAL HIGH (ref 0.550–4.780)
TSH: 10.715 u[IU]/mL — ABNORMAL HIGH (ref 0.550–4.780)

## 2020-09-28 LAB — MAGNESIUM
Magnesium: 1.8 mg/dL (ref 1.6–2.6)
Magnesium: 1.8 mg/dL (ref 1.6–2.6)

## 2020-10-01 LAB — MISC. LAB TEST

## 2020-10-02 LAB — FOLATE
Folate: 31.62 ng/ml — ABNORMAL HIGH (ref 5.38–24.00)
Folate: 31.62 ng/ml — ABNORMAL HIGH (ref 5.38–24.00)

## 2020-10-10 ENCOUNTER — Inpatient Hospital Stay: Admit: 2020-10-10 | Payer: MEDICARE | Attending: Internal Medicine | Primary: Internal Medicine

## 2020-10-10 ENCOUNTER — Inpatient Hospital Stay: Admit: 2020-10-10 | Discharge: 2020-10-10 | Payer: MEDICARE | Primary: Internal Medicine

## 2020-10-10 DIAGNOSIS — M5431 Sciatica, right side: Secondary | ICD-10-CM

## 2020-10-10 LAB — AMMONIA
Ammonia, plasma: 61 umol/L — ABNORMAL HIGH (ref 11.2–31.7)
Ammonia: 61 umol/L — ABNORMAL HIGH (ref 11.2–31.7)

## 2020-10-21 ENCOUNTER — Emergency Department: Admit: 2020-10-21 | Payer: MEDICARE | Primary: Internal Medicine

## 2020-10-21 ENCOUNTER — Inpatient Hospital Stay
Admit: 2020-10-21 | Discharge: 2020-11-13 | Disposition: A | Discharge status: Rehabilitation Facility | Payer: MEDICARE | Attending: Internal Medicine | Admitting: Internal Medicine

## 2020-10-21 DIAGNOSIS — K7200 Acute and subacute hepatic failure without coma: Principal | ICD-10-CM

## 2020-10-21 LAB — METABOLIC PANEL, BASIC
Anion gap: 9 mmol/L (ref 5–15)
BUN: 10 mg/dl (ref 9–23)
CO2: 28 mEq/L (ref 20–31)
Calcium: 8.6 mg/dl — ABNORMAL LOW (ref 8.7–10.4)
Chloride: 102 mEq/L (ref 98–107)
Creatinine: 0.71 mg/dl (ref 0.55–1.02)
GFR est AA: >60
GFR est non-AA: >60
Glucose: 85 mg/dl (ref 74–106)
Potassium: 3 mEq/L — ABNORMAL LOW (ref 3.5–5.1)
Sodium: 139 mEq/L (ref 136–145)

## 2020-10-21 LAB — CBC WITH AUTOMATED DIFF
BASOPHILS: 0.3 % (ref 0–3)
EOSINOPHILS: 0.9 % (ref 0–5)
HCT: 39.1 % (ref 37.0–50.0)
HGB: 13.3 gm/dl (ref 13.0–17.2)
IMMATURE GRANULOCYTES: 0.3 % (ref 0.0–3.0)
LYMPHOCYTES: 23.1 % — ABNORMAL LOW (ref 28–48)
MCH: 34.2 pg (ref 25.4–34.6)
MCHC: 34 gm/dl (ref 30.0–36.0)
MCV: 100.5 fL — ABNORMAL HIGH (ref 80.0–98.0)
MONOCYTES: 14.4 % — ABNORMAL HIGH (ref 1–13)
MPV: 10.1 fL — ABNORMAL HIGH (ref 6.0–10.0)
NEUTROPHILS: 61 % (ref 34–64)
NRBC: 0 (ref 0–0)
PLATELET: 151 10*3/uL (ref 140–450)
RBC: 3.89 M/uL (ref 3.60–5.20)
RDW-SD: 50.7 — ABNORMAL HIGH (ref 36.4–46.3)
WBC: 5.8 10*3/uL (ref 4.0–11.0)

## 2020-10-21 LAB — HEPATIC FUNCTION PANEL
ALT (SGPT): 15 U/L (ref 10–49)
ALT: 15 U/L (ref 10–49)
AST (SGOT): 58 U/L — ABNORMAL HIGH (ref 0.0–33.9)
AST: 58 U/L — ABNORMAL HIGH (ref 0.0–33.9)
Albumin: 3.5 gm/dl (ref 3.4–5.0)
Albumin: 3.5 gm/dl (ref 3.4–5.0)
Alk. phosphatase: 98 U/L (ref 46–116)
Alkaline Phosphatase: 98 U/L (ref 46–116)
Bilirubin, Direct: 0.7 mg/dl — ABNORMAL HIGH (ref 0.0–0.3)
Bilirubin, direct: 0.7 mg/dl — ABNORMAL HIGH (ref 0.0–0.3)
Bilirubin, total: 1.7 mg/dl — ABNORMAL HIGH (ref 0.30–1.20)
Protein, total: 6.1 gm/dl (ref 5.7–8.2)
Total Bilirubin: 1.7 mg/dl — ABNORMAL HIGH (ref 0.30–1.20)
Total Protein: 6.1 gm/dl (ref 5.7–8.2)

## 2020-10-21 LAB — TSH 3RD GENERATION
TSH: 9.565 u[IU]/mL — ABNORMAL HIGH (ref 0.550–4.780)
TSH: 9.565 u[IU]/mL — ABNORMAL HIGH (ref 0.550–4.780)

## 2020-10-21 LAB — T4, FREE
FREE T4, FT4T: 0.66 ng/dl — ABNORMAL LOW (ref 0.89–1.76)
Free T4: 0.66 ng/dl — ABNORMAL LOW (ref 0.89–1.76)

## 2020-10-21 LAB — AMMONIA
Ammonia, plasma: 45 umol/L — ABNORMAL HIGH (ref 11.2–31.7)
Ammonia: 45 umol/L — ABNORMAL HIGH (ref 11.2–31.7)

## 2020-10-21 LAB — PROTHROMBIN TIME + INR
INR: 1.4 — ABNORMAL HIGH (ref 0.1–1.1)
Prothrombin time: 16.6 seconds — ABNORMAL HIGH (ref 10.2–12.9)

## 2020-10-21 LAB — MAGNESIUM
Magnesium: 1.7 mg/dL (ref 1.6–2.6)
Magnesium: 1.7 mg/dL (ref 1.6–2.6)

## 2020-10-21 LAB — BASIC METABOLIC PANEL
Anion Gap: 9 mmol/L (ref 5–15)
BUN: 10 mg/dl (ref 9–23)
CO2: 28 mEq/L (ref 20–31)
Calcium: 8.6 mg/dl — ABNORMAL LOW (ref 8.7–10.4)
Chloride: 102 mEq/L (ref 98–107)
Creatinine: 0.71 mg/dl (ref 0.55–1.02)
EGFR IF NonAfrican American: >60
GFR African American: >60
Glucose: 85 mg/dl (ref 74–106)
Potassium: 3 mEq/L — ABNORMAL LOW (ref 3.5–5.1)
Sodium: 139 mEq/L (ref 136–145)

## 2020-10-21 LAB — CBC WITH AUTO DIFFERENTIAL
Basophils %: 0.3 % (ref 0–3)
Eosinophils %: 0.9 % (ref 0–5)
Hematocrit: 39.1 % (ref 37.0–50.0)
Hemoglobin: 13.3 gm/dl (ref 13.0–17.2)
Immature Granulocytes: 0.3 % (ref 0.0–3.0)
Lymphocytes %: 23.1 % — ABNORMAL LOW (ref 28–48)
MCH: 34.2 pg (ref 25.4–34.6)
MCHC: 34 gm/dl (ref 30.0–36.0)
MCV: 100.5 fL — ABNORMAL HIGH (ref 80.0–98.0)
MPV: 10.1 fL — ABNORMAL HIGH (ref 6.0–10.0)
Monocytes %: 14.4 % — ABNORMAL HIGH (ref 1–13)
Neutrophils %: 61 % (ref 34–64)
Nucleated RBCs: 0 (ref 0–0)
Platelets: 151 10*3/uL (ref 140–450)
RBC: 3.89 M/uL (ref 3.60–5.20)
RDW-SD: 50.7 — ABNORMAL HIGH (ref 36.4–46.3)
WBC: 5.8 10*3/uL (ref 4.0–11.0)

## 2020-10-21 LAB — PROTIME-INR
INR: 1.4 — ABNORMAL HIGH (ref 0.1–1.1)
Protime: 16.6 seconds — ABNORMAL HIGH (ref 10.2–12.9)

## 2020-10-21 MED ORDER — POTASSIUM CHLORIDE SR 20 MEQ TAB, PARTICLES/CRYSTALS
20 mEq | Freq: Once | ORAL | Status: AC
Start: 2020-10-21 — End: 2020-10-21
  Administered 2020-10-22: 01:00:00 via ORAL

## 2020-10-21 MED ORDER — HYDROCODONE-ACETAMINOPHEN 5 MG-325 MG TAB
5-325 mg | Freq: Four times a day (QID) | ORAL | Status: DC | PRN
Start: 2020-10-21 — End: 2020-11-13

## 2020-10-21 MED ORDER — FOLIC ACID 1 MG TAB
1 mg | Freq: Every day | ORAL | Status: DC
Start: 2020-10-21 — End: 2020-11-13
  Administered 2020-10-22: 13:00:00 via ORAL

## 2020-10-21 MED ORDER — FUROSEMIDE 40 MG TAB
40 mg | ORAL | Status: DC
Start: 2020-10-21 — End: 2020-10-24
  Administered 2020-10-23: 14:00:00 via ORAL

## 2020-10-21 MED ORDER — LISINOPRIL 5 MG TAB
5 mg | Freq: Every day | ORAL | Status: DC
Start: 2020-10-21 — End: 2020-10-23
  Administered 2020-10-22 – 2020-10-23 (×2): via ORAL

## 2020-10-21 MED ORDER — MELATONIN 3 MG TAB
3 mg | Freq: Every evening | ORAL | Status: DC
Start: 2020-10-21 — End: 2020-11-13
  Administered 2020-10-22 – 2020-10-27 (×6): via ORAL

## 2020-10-21 MED ORDER — NALOXONE 0.4 MG/ML INJECTION
0.4 mg/mL | INTRAMUSCULAR | Status: DC | PRN
Start: 2020-10-21 — End: 2020-11-13

## 2020-10-21 MED ORDER — MAGNESIUM OXIDE 400 MG TAB
400 mg | Freq: Every day | ORAL | Status: DC
Start: 2020-10-21 — End: 2020-11-13
  Administered 2020-10-22 – 2020-10-27 (×6): via ORAL

## 2020-10-21 MED ORDER — ONDANSETRON (PF) 4 MG/2 ML INJECTION
4 mg/2 mL | INTRAMUSCULAR | Status: DC | PRN
Start: 2020-10-21 — End: 2020-11-13
  Administered 2020-10-26 – 2020-11-08 (×7): via INTRAVENOUS

## 2020-10-21 MED ORDER — METOPROLOL TARTRATE 50 MG TAB
50 mg | Freq: Two times a day (BID) | ORAL | Status: DC
Start: 2020-10-21 — End: 2020-10-25
  Administered 2020-10-22 – 2020-10-25 (×8): via ORAL

## 2020-10-21 MED ORDER — LACTULOSE 10 GRAM/15 ML ORAL SOLUTION
10 gram/15 mL | Freq: Every day | ORAL | Status: DC
Start: 2020-10-21 — End: 2020-10-25
  Administered 2020-10-22 – 2020-10-25 (×4): via ORAL

## 2020-10-21 MED ORDER — HYDROXYCHLOROQUINE 200 MG TAB
200 mg | Freq: Two times a day (BID) | ORAL | Status: DC
Start: 2020-10-21 — End: 2020-11-13
  Administered 2020-10-24 – 2020-11-13 (×37): via ORAL

## 2020-10-21 MED ORDER — ACETAMINOPHEN (TYLENOL) SOLUTION 32MG/ML
ORAL | Status: DC | PRN
Start: 2020-10-21 — End: 2020-11-13
  Administered 2020-10-27: 10:00:00 via ORAL

## 2020-10-21 MED ORDER — HEPARIN (PORCINE) 5,000 UNIT/ML IJ SOLN
5000 unit/mL | Freq: Two times a day (BID) | INTRAMUSCULAR | Status: DC
Start: 2020-10-21 — End: 2020-10-23
  Administered 2020-10-22 – 2020-10-23 (×4): via SUBCUTANEOUS

## 2020-10-21 MED ORDER — ALENDRONATE 70 MG TAB
70 mg | ORAL | Status: DC
Start: 2020-10-21 — End: 2020-10-21

## 2020-10-21 MED ORDER — IPRATROPIUM-ALBUTEROL 2.5 MG-0.5 MG/3 ML NEB SOLUTION
2.5 mg-0.5 mg/3 ml | RESPIRATORY_TRACT | Status: DC | PRN
Start: 2020-10-21 — End: 2020-11-11

## 2020-10-21 MED ORDER — FUROSEMIDE 40 MG TAB
40 mg | Freq: Every day | ORAL | Status: DC
Start: 2020-10-21 — End: 2020-11-13
  Administered 2020-10-22 – 2020-10-27 (×6): via ORAL

## 2020-10-21 MED ORDER — SODIUM CHLORIDE 0.9 % IJ SYRG
Freq: Once | INTRAMUSCULAR | Status: AC
Start: 2020-10-21 — End: 2020-10-21

## 2020-10-21 MED ORDER — CALCIUM-CHOLECALCIFEROL (D3) 500 MG (1,250 MG)-200 UNIT TAB
500 mg-200 unit | Freq: Every day | ORAL | Status: DC
Start: 2020-10-21 — End: 2020-11-13
  Administered 2020-10-22 – 2020-10-27 (×6): via ORAL

## 2020-10-21 MED ORDER — LEVOTHYROXINE 125 MCG TAB
125 mcg | ORAL | Status: DC
Start: 2020-10-21 — End: 2020-11-01
  Administered 2020-10-22 – 2020-10-27 (×6): via ORAL

## 2020-10-21 MED ORDER — ENOXAPARIN 40 MG/0.4 ML SUB-Q SYRINGE
40 mg/0.4 mL | SUBCUTANEOUS | Status: DC
Start: 2020-10-21 — End: 2020-10-21

## 2020-10-21 MED ORDER — POTASSIUM CHLORIDE SR 20 MEQ TAB, PARTICLES/CRYSTALS
20 mEq | Freq: Every day | ORAL | Status: DC
Start: 2020-10-21 — End: 2020-11-13
  Administered 2020-10-22 – 2020-10-27 (×5): via ORAL

## 2020-10-21 MED ORDER — ACETAMINOPHEN 325 MG TABLET
325 mg | ORAL | Status: DC | PRN
Start: 2020-10-21 — End: 2020-11-13
  Administered 2020-10-31 – 2020-11-07 (×3): via ORAL

## 2020-10-21 MED ORDER — ACETAMINOPHEN 650 MG RECTAL SUPPOSITORY
650 mg | RECTAL | Status: DC | PRN
Start: 2020-10-21 — End: 2020-11-13

## 2020-10-21 MED FILL — METOPROLOL TARTRATE 50 MG TAB: 50 mg | ORAL | Qty: 1

## 2020-10-21 MED FILL — HEPARIN (PORCINE) 5,000 UNIT/ML IJ SOLN: 5000 unit/mL | INTRAMUSCULAR | Qty: 1

## 2020-10-21 MED FILL — POTASSIUM CHLORIDE SR 20 MEQ TAB, PARTICLES/CRYSTALS: 20 mEq | ORAL | Qty: 2

## 2020-10-21 NOTE — H&P (Signed)
[facility]  History and Physical  NAME:  [name] [name], [name] [name]  SEX:   F  ADMIT: 10/21/2020  DOB: 1933-09-15  MR#    [id number]  ROOM:  [id number]  ACCT#  [id number]    I hereby certify this patient for admission based upon medical necessity as noted below:    cc: Dshawn Mcnay DO      CHIEF COMPLAINT:    Found on floor.     HISTORY OF CHIEF COMPLAINT:    The patient is an 87 year old female with a history of rheumatoid arthritis, hypertension, hemochromatosis, and hypothyroidism, who had fallen yesterday and again this morning fell at her adult care facility.  She complained of some left hip pain.  She was brought to the ER for evaluation.     The patient is confused and not able to provide a meaningful history.  She does not recall falling either this morning or yesterday. According to her son, she did have a fall yesterday evening as well.  Additionally, the patient has had intermittent problems with confusion and hallucinations over the last 2 months or so.  Lab work as an outpatient showed her to have an elevated ammonia level.  She was started on low-dose lactulose; her methotrexate and Plaquenil were held as well as her folic acid level.  According to the son, the patient's level of confusion improved significantly, but in the past week or so she has become more confused again.     In the ER, the patient is afebrile, blood pressure 151/86, pulse of 64, respiratory rate 18, O2 sat 98% on room air.  She has got normal sinus rhythm with occasional PVCs on telemetry.  An x-ray of her left elbow shows no fracture.  X-ray left hip shows no fracture.  X-ray left shoulder shows no fracture.  White blood cell count is normal.  Her potassium is a little low at 3.0, magnesium low normal at 1.7.  BUN is 10, creatinine 0.71, sodium is normal at 139.  Her SGOT is mildly elevated at 58 and her bilirubin at 1.71. SGPT and alkaline phosphatase levels are normal.  Ammonia level is elevated at 45, prior values were 61 on  10/10/2020 and 106 on 09/24/2020.  TSH is elevated 9.565, free T4 low at 0.66.  CT scan of the head shows no acute abnormality.  CT scan of the neck shows no acute abnormality.       The patient is going to be admitted to a general medical floor with metabolic encephalopathy, probably multifactorial with an element of hepatic encephalopathy.  Etiology yet to be determined as well as hypothyroidism on inadequate replacement.     While in the ER, the facility contacted the emergency room.  Apparently, a number of patients whom this patient socially interacts with at the adult care facility have become COVID positive.  The patient will be screened for COVID-19.      PAST MEDICAL HISTORY:    1.  No known coronary artery disease.  2.  Echocardiogram 12/2016 shows an ejection fraction of 55% with grade 1 diastolic dysfunction.   3.  A 48-hour Holter monitor from 12/2016 showed some supraventricular ectopy, wandering atrial pacemaker and sinus tachycardia, but no malicious arrhythmias.  4.  Hypertension.  5.  Rheumatoid arthritis (Dr. Salem Caster), on methotrexate for many years.  6.  Hemochromatosis (Dr. Arnette Felts) requiring periodic phlebotomies.   7.  Hypothyroidism, on replacement therapy.  8.  Osteoporosis.  9.  History of a subdural hematoma  08/2017 treated conservatively without requiring surgery.  10.  Prior surgeries include left hip surgery, right hip surgery, left knee replacement, left femur fracture repaired secondary to a fall, kidney stone extraction, thyroid surgery, total abdominal hysterectomy with subsequent bilateral salpingo-oophorectomy, 3 C-sections, partial colectomy, a negative breast biopsy on the left, laparoscopic surgery, right shoulder surgery and right carpal tunnel surgery.     CURRENT MEDICATIONS:    Please see med recon.     ALLERGIES:    SULFA AND SHELLFISH.     SOCIAL HISTORY:    She is a widow.  She has 2 sons in the area and a daughter in [place].  She does not smoke or drink.  She is a  resident of a local assisted living facility.     FAMILY HISTORY:    Father died of lung cancer.  She has 2 sisters who had breast cancer.     IMMUNIZATIONS:    The patient has received a Prevnar 13 and Pneumovax and flu shot every year.  She has had all 4 COVID vaccines.  Additionally, she has had a Tdap recently and at least 1 Zostavax.      While in the ER, the facility contacted to the emergency room.  Apparently a number of patients who the post.  The patient socially interacts within the adult care facility have become COVID positive.  The patient will be screened for COVID-19.     REVIEW OF SYSTEMS:    Again, the patient is not a 100% reliable historian. She complains of chronic pains in her ankles, knees, elbows, shoulders and more recently lower neck.  She denies any chest pain or shortness of breath.  She has had some increased cough that she attributes to increased nasal congestion.  She denies any fever or chills.  She denies any abdominal pain, nausea, vomiting, diarrhea, constipation, melena, or hematochezia.  She denies any dysuria, frequency, or urgency.     PHYSICAL EXAMINATION:    GENERAL:  The patient is a pleasant 87 year old female.  She is awake and alert, but confused.  When initially asked where she was, she stated that she was at a relative's house.  She is able to identify the year, but not the date.  She does not have a recollection of falling either this morning or yesterday.  HEENT:  Speech is fluent.  NEUROLOGIC:  Motor strength is +5 grossly.  She has mild slowness with conversation and bradykinesia generally.  NECK:  No jugular venous distention is noted.  No cervical adenopathy.  LUNGS:  Clear to auscultation in all fields without crackles, wheezes, rhonchi, or dullness.  HEART:  Regular at a rate of about 60 with a grade 3 systolic murmur over the aortic area.  ABDOMEN:  Soft, obese, nontender.  Bowel sounds present in all 4 quadrants.  No masses are palpable.  She has got 1+  pitting edema in both lower extremities and skin changes of chronic venous stasis.  PT pulses are +1 bilaterally.  There is no cyanosis.     ASSESSMENT AND PLAN:    1.  Several falls over the last 24 to 48 hours.  The patient is being admitted to a general medical floor.  Will request physical therapy and occupational therapy evaluations.  The patient normally ambulates with a walker.  2.  Metabolic encephalopathy, probably multifactorial related to hepatic encephalopathy, etiology as yet to be determined and possibly hypothyroidism with inadequate replacement.  We will schedule an ultrasound of the  right upper quadrant.  She was advised about a month ago to discontinue her methotrexate and folic acid.  She is coming in today with it is still on her medication list, however.  The patient may require GI consultation for assistance in evaluation.  In the meantime, we will increase the patient's lactulose dose from 15 grams daily to 30 grams daily.  There has been improvement in her lactulose and her ammonia levels over the last month, which has coincided with improvement in level of confusion and resolution of hallucinations.  Recent outpatient evaluations have included a normal haptoglobin level, mildly elevated C-reactive protein level at 12 on 09/24/2020, normal B12, top normal vitamin D levels (vitamin D also held in the last month), and mildly supratherapeutic folate level at 31.62.  3.  Subcutaneous heparin for deep venous thrombosis prophylaxis.  4.  Check urinalysis and urine culture.  5.  Hypothyroidism.  Increase thyroid replacement to 125 mcg daily.  6.  Hypokalemia.  Replace potassium.      ___________________  [name] DO   Dictated TI:[id number] L. Townes Fuhs, DO  SA  D: 10/21/2020 19:21:16  T: 10/21/2020 19:53:40  [id number]

## 2020-10-21 NOTE — ED Notes (Signed)
Pt arrived by EMS [facility] #4. Pt had unwitnessed ground level fall and c/o L hip pain only. BG 103. A & O x4, pt usually ambulates with walker. Pt from [place] at [place]. No blood thinners. Pt was on floor x65mins.

## 2020-10-21 NOTE — ED Notes (Signed)
TRANSFER - OUT REPORT:    Verbal report given to RN on floor on [name] [name]  being transferred to [phone] for routine progression of care       Report consisted of patient's Situation, Background, Assessment and   Recommendations(SBAR).     Information from the following report(s) SBAR was reviewed with the receiving nurse.    Lines:   Peripheral IV 10/21/20 Left Antecubital (Active)   Site Assessment Clean, dry, & intact 10/21/20 1255   Phlebitis Assessment 0 10/21/20 1255   Infiltration Assessment 0 10/21/20 1255   Dressing Status Clean, dry, & intact 10/21/20 1255   Dressing Type Transparent 10/21/20 1255   Hub Color/Line Status Patent;Flushed 10/21/20 1255   Action Taken Blood drawn 10/21/20 1255   Alcohol Cap Used Yes 10/21/20 1255        Opportunity for questions and clarification was provided.      Patient transported with:   [employer]

## 2020-10-21 NOTE — ED Notes (Signed)
BSSR received from [facility]. Pt denies any pain at this time. Pt has family member at bedside. Pt is AOx3 and able to speak in complete sentences. Pt is able to move all extremities.

## 2020-10-21 NOTE — ED Notes (Signed)
Bedside report given to Rachael, RN, she didn't have any questions for me after giving her report.

## 2020-10-21 NOTE — ED Notes (Signed)
Pt's facility called and said pt was exposed to COVID as 22 residents that she eats with have all tested positive. Family is aware as well.

## 2020-10-21 NOTE — ED Notes (Signed)
Pt assisted to bedside commode. Pt was able to stand with 1 assist. Pt put weight on both legs with no issues.

## 2020-10-21 NOTE — ED Provider Notes (Signed)
[facility]  Emergency Department Treatment Report        Patient: [name] [name] Age: 87 y.o. Sex: female    Date of Birth: 1933-11-17 Admit Date: 10/21/2020 PCP: Colen Darling, MD   MRN: [id number]  CSN: [id number]  Attending:  Serita Sheller, [name]   Room: 6623/6623 Time Dictated: 12:18 PM APP:  None     I hereby certify this patient for admission based upon medical necessity as noted below:      Chief Complaint   Fall, hip pain    History of Present Illness   This is a 87 y.o. female who has a history of hemochromatosis and previous replacement of both hips.  So the bedside states that lately they have had a hard time "drawing her blood off of hemochromatosis and she has had problems with elevated ammonia.  She has been having increasing hallucinations.  PCP started her on lactulose about a month ago and the hallucinations seem to be improving but now seem to be worse again.  Patient states that she hit her head on a cabinet which caused her to fall onto the ground.  She hit her head on the ground and also landed on her left side.  She is complaining of left elbow pain as well as left hip pain.  She denies loss of consciousness.  She has a history of subdural hematoma from 2019 that was managed conservatively.    Review of Systems   Review of Systems   Constitutional:  Positive for malaise/fatigue. Negative for fever.   HENT:  Negative for congestion, ear pain and sore throat.    Eyes:  Negative for pain and discharge.   Respiratory:  Negative for cough and shortness of breath.    Cardiovascular:  Negative for chest pain.   Gastrointestinal:  Negative for abdominal pain, nausea and vomiting.   Genitourinary:  Negative for dysuria.   Musculoskeletal:  Positive for falls and joint pain. Negative for neck pain.   Skin:  Negative for rash.   Neurological:  Positive for headaches. Negative for weakness.   Endo/Heme/Allergies:  Does not bruise/bleed easily.   Psychiatric/Behavioral:  Positive for  hallucinations and memory loss.    Some of the review of systems obtained from the son.  He reports increasing hallucinations over the last week or 2.  Past Medical/Surgical History     Past Medical History:   Diagnosis Date    Arthritis     Deep venous thrombosis (HCC)     Endocrine disease     hypothyroid    Hemochromatosis     Hypertension    Subdural hematoma  Rheumatoid arthritis  Osteoarthritis  Thyroid cancer status post radioactive iodine treatment remotely  COPD    Past Surgical History:   Procedure Laterality Date    HX ORTHOPAEDIC      left knee replacement   Surgery for kidney stones  Bilateral hip replacement  Left knee replacement  Surgery for left femur fracture  Cesarean section x3  Surgery for diverticulitis  Eye surgery for macular degeneration    Social History     Social History     Socioeconomic History    Marital status: WIDOWED     Spouse name: Not on file    Number of children: Not on file    Years of education: Not on file    Highest education level: Not on file   Occupational History    Not on file  Tobacco Use    Smoking status: Never    Smokeless tobacco: Never   Vaping Use    Vaping Use: Never used   Substance and Sexual Activity    Alcohol use: No    Drug use: No    Sexual activity: Not on file   Other Topics Concern    Not on file   Social History Narrative    Not on file     Social Determinants of Health     Financial Resource Strain: Not on file   Food Insecurity: Not on file   Transportation Needs: Not on file   Physical Activity: Not on file   Stress: Not on file   Social Connections: Not on file   Intimate Partner Violence: Not on file   Housing Stability: Not on file     Remote history of smoking.  No longer uses alcohol.  No recreational drugs.  Family History   History reviewed. No pertinent family history.  Mother had thyroid disease.  Father died at age 52 of liver cancer    Current Medications     Prior to Admission Medications   Prescriptions Last Dose Informant Patient  Reported? Taking?   albuterol-ipratropium (DUO-NEB) 2.5 mg-0.5 mg/3 ml nebu   No No   Sig: 3 mL by Nebulization route every four (4) hours as needed (sob).   calcium-cholecalciferol, d3, (CALCIUM 600 + D) 600-125 mg-unit tab   Yes No   Sig: Take 1 Tab by mouth daily.   folic acid (FOLVITE) 1 mg tablet   Yes No   Sig: Take 1 mg by mouth daily.   furosemide (LASIX) 40 mg tablet   Yes No   Sig: Take 40 mg by mouth daily.   hydroxychloroquine (PLAQUENIL) 200 mg tablet   Yes No   Sig: Take 200 mg by mouth two (2) times a day.   levothyroxine (SYNTHROID) 125 mcg tablet   Yes No   Sig: Take 0.75 mcg by mouth Daily (before breakfast).   lisinopril (PRINIVIL, ZESTRIL) 10 mg tablet   Yes No   Sig: Take 10 mg by mouth daily.   methotrexate (RHEUMATREX) 2.5 mg tablet   Yes No   Sig: Take 12.5 mg by mouth every Tuesday. Methotrexate 2.5 mg tab, take 5 tabs po q week  Q tuesday   metoprolol tartrate (LOPRESSOR) 50 mg tablet   Yes No   Sig: Take 50 mg by mouth two (2) times a day.   potassium chloride SR (KLOR-CON 10) 10 mEq tablet   Yes No   Sig: Take 10 mEq by mouth daily.   predniSONE (DELTASONE) 10 mg tablet   Yes No   Sig: Take  by mouth daily (with breakfast).      Facility-Administered Medications: None        Allergies     Allergies   Allergen Reactions    Shellfish Derived Hives    Sulfa (Sulfonamide Antibiotics) Hives       Physical Exam   Patient Vitals for the past 24 hrs:   Temp Pulse Resp BP SpO2   10/21/20 1838 -- 64 20 (!) 141/89 94 %   10/21/20 1808 -- 67 18 132/79 97 %   10/21/20 1738 -- 67 20 (!) 142/65 93 %   10/21/20 1708 -- 66 19 (!) 144/76 93 %   10/21/20 1638 -- 66 19 129/77 93 %   10/21/20 1608 -- 64 19 125/69 94 %   10/21/20 1538 -- 65 18  135/79 95 %   10/21/20 1508 -- 66 20 (!) 147/67 95 %   10/21/20 1338 -- -- -- (!) 151/84 --   10/21/20 1108 98.4 ??F (36.9 ??C) 64 20 (!) 151/86 98 %     Physical Exam  Constitutional:       Appearance: She is not toxic-appearing or diaphoretic.   HENT:      Head:  Normocephalic and atraumatic.      Right Ear: External ear normal.      Left Ear: External ear normal.      Nose: Nose normal.      Mouth/Throat:      Mouth: Mucous membranes are dry.      Pharynx: Oropharynx is clear.   Eyes:      General: No scleral icterus.     Conjunctiva/sclera: Conjunctivae normal.      Pupils: Pupils are equal, round, and reactive to light.   Cardiovascular:      Rate and Rhythm: Normal rate and regular rhythm.      Heart sounds: No murmur heard.  Pulmonary:      Effort: No respiratory distress.      Breath sounds: Normal breath sounds. No wheezing or rales.   Abdominal:      General: Bowel sounds are normal. There is no distension.      Palpations: Abdomen is soft.      Tenderness: There is no abdominal tenderness. There is no guarding.   Musculoskeletal:         General: Tenderness present.      Cervical back: Normal range of motion. No tenderness.      Right lower leg: 2+ Edema present.      Left lower leg: 2+ Edema present.      Comments: Tender to palpation lateral aspect of left elbow.  Tender to palpation left shoulder.  Tender to palpation left anterior groin.  No tenderness over the pubis bone.  No tenderness over the greater trochanter of the left hip.  No pain with gentle internal and external rotation of the left hip.  Patient is able to lift both legs off the bed.  No significant midline neck or back tenderness.   Skin:     General: Skin is warm and dry.   Neurological:      Mental Status: She is alert. She is confused.      GCS: GCS eye subscore is 4. GCS verbal subscore is 5. GCS motor subscore is 6.      Cranial Nerves: No dysarthria or facial asymmetry.      Sensory: Sensation is intact.      Motor: Motor function is intact.   Psychiatric:         Mood and Affect: Mood normal.         Speech: Speech normal.         Behavior: Behavior is cooperative.         Cognition and Memory: Memory is impaired.        Impression and Management Plan   Ms. Ahmad is an 87 year old female who  was found on the floor yesterday and presumed that she had fallen out of bed.  She fell again today.  Today she is complaining of left elbow and shoulder pain as well as left hip pain.  She is able to lift both legs off the bed.  Son at the bedside also notes increasing confusion and hallucinations with elevated ammonia levels.  She is recently been started  on lactulose over the last month or so.  She seems to be having more hallucinations again.  He notes outpatient recent lab work and x-ray imaging.  Differential diagnosis includes hepatic encephalopathy, intracranial hemorrhage, mechanical fall.  I have considered fracture of the left shoulder, left elbow, left hip.  Pelvic fracture also considered.  X-rays ordered of left hip elbow and shoulder.  I also ordered a twelve-lead EKG as well as an EKG and telemetry monitoring.  I ordered a CT of the head as well as a CT of the C-spine.  Also have ordered CBC, BMP, hepatic function panel, serum ammonia level, magnesium, TSH and free T4, PT/INR because of suspected liver disease.    Diagnostic Studies   Lab:   Recent Results (from the past 12 hour(s))   CBC WITH AUTOMATED DIFF    Collection Time: 10/21/20 12:52 PM   Result Value Ref Range    WBC 5.8 4.0 - 11.0 1000/mm3    RBC 3.89 3.60 - 5.20 M/uL    HGB 13.3 13.0 - 17.2 gm/dl    HCT 39.1 37.0 - 50.0 %    MCV 100.5 (H) 80.0 - 98.0 fL    MCH 34.2 25.4 - 34.6 pg    MCHC 34.0 30.0 - 36.0 gm/dl    PLATELET 151 140 - 450 1000/mm3    MPV 10.1 (H) 6.0 - 10.0 fL    RDW-SD 50.7 (H) 36.4 - 46.3      NRBC 0 0 - 0      IMMATURE GRANULOCYTES 0.3 0.0 - 3.0 %    NEUTROPHILS 61.0 34 - 64 %    LYMPHOCYTES 23.1 (L) 28 - 48 %    MONOCYTES 14.4 (H) 1 - 13 %    EOSINOPHILS 0.9 0 - 5 %    BASOPHILS 0.3 0 - 3 %   PROTHROMBIN TIME + INR    Collection Time: 10/21/20 12:52 PM   Result Value Ref Range    Prothrombin time 16.6 (H) 10.2 - 12.9 seconds    INR 1.4 (H) 0.1 - 1.1     MAGNESIUM    Collection Time: 10/21/20 12:52 PM   Result Value Ref  Range    Magnesium 1.7 1.6 - 2.6 mg/dL   AMMONIA    Collection Time: 10/21/20 12:52 PM   Result Value Ref Range    Ammonia, plasma 45.0 (H) 11.2 - 30.1 umol/L   METABOLIC PANEL, BASIC    Collection Time: 10/21/20 12:52 PM   Result Value Ref Range    Potassium 3.0 (L) 3.5 - 5.1 mEq/L    Chloride 102 98 - 107 mEq/L    Sodium 139 136 - 145 mEq/L    CO2 28 20 - 31 mEq/L    Glucose 85 74 - 106 mg/dl    BUN 10 9 - 23 mg/dl    Creatinine 0.71 0.55 - 1.02 mg/dl    GFR est AA >60.0      GFR est non-AA >60      Calcium 8.6 (L) 8.7 - 10.4 mg/dl    Anion gap 9 5 - 15 mmol/L   HEPATIC FUNCTION PANEL    Collection Time: 10/21/20 12:52 PM   Result Value Ref Range    AST (SGOT) 58.0 (H) 0.0 - 33.9 U/L    ALT (SGPT) 15 10 - 49 U/L    Alk. phosphatase 98 46 - 116 U/L    Bilirubin, total 1.70 (H) 0.30 - 1.20 mg/dl    Protein, total  6.1 5.7 - 8.2 gm/dl    Albumin 3.5 3.4 - 5.0 gm/dl    Bilirubin, direct 0.7 (H) 0.0 - 0.3 mg/dl   TSH 3RD GENERATION    Collection Time: 10/21/20 12:52 PM   Result Value Ref Range    TSH 9.565 (H) 0.550 - 4.780 uIU/mL   T4, FREE    Collection Time: 10/21/20 12:52 PM   Result Value Ref Range    Free T4 0.66 (L) 0.89 - 1.76 ng/dl       Imaging:    XR SHOULDER LT AP/LAT MIN 2 V    Result Date: 10/21/2020  EXAM: XR SHOULDER LT AP/LAT MIN 2 V INDICATION: Left elbow and shoulder pain after fall.  History of rheumatoid arthritis.  COMPARISON: None available. WORKSTATION ID: [id number] TECHNIQUE: 3 views left shoulder obtained in 4 exposures. FINDINGS: No acute fracture or dislocation. Severe AC joint osteoarthritis. Severe glenohumeral joint osteoarthritis. Imaged lung is clear.     IMPRESSION: No fracture or dislocation.     XR ELBOW LT AP/LAT    Result Date: 10/21/2020  EXAMINATION: XR ELBOW LT AP/LAT INDICATION: injury COMPARISON: No comparison available. WORKSTATION ID: [id number] TECHNIQUE: 2 views left elbow obtained in 4 exposures. FINDINGS: No displaced fracture. No dislocation. Large collar osteophyte of  the radial head somewhat obscures evaluation. Severe osteoarthritis and capsular heterotopic ossification along the anterior joint capsule. No joint effusion.     IMPRESSION: No acute fracture or dislocation.     XR HIP LT W OR WO PELV 2-3 VWS    Result Date: 10/21/2020  EXAMINATION: XR HIP LT W OR WO PELV 2-3 VWS INDICATION: Left hip pain after fall.  History of hip replacement. COMPARISON: 09/13/2020 WORKSTATION ID: [id number] TECHNIQUE: AP pelvis, frontal and lateral views of the left hip. FINDINGS: Left total hip arthroplasty with trochanteric claw plate and multiple cerclage wires appears intact. No periprosthetic fracture. No evidence of loosening. Right total hip arthroplasty appears grossly intact on the frontal view of the pelvis.     IMPRESSION:  No acute osseous abnormality.     CT HEAD WO CONT    Result Date: 10/21/2020  EXAM:  CT SPINE CERV WO CONT, CT HEAD WO CONT INDICATION: Recurrent falls.  History of subdural.  History of rheumatoid arthritis COMPARISON: 2019 TECHNIQUE: CT axial images of the brain and cervical spine without contrast. Coronal and sagittal reconstructed images were performed. All CT exams at this facility use one or more dose reduction techniques including automatic exposure control, mA/kV adjustment per patient's size, or iterative reconstruction technique. WORKSTATION ID: [id number] FINDINGS: BRAIN: No acute intracranial hemorrhage, mass effect, or CT evidence of acute infarct. Mild diffuse parenchymal volume loss. No hydrocephalus. There is subcortical and periventricular hypodensity compatible with gliosis due to nonacute ischemic change. Orbits are unremarkable.  Paranasal sinuses and mastoid air cells are unremarkable. Calvarium intact. CERVICAL SPINE: No fracture or traumatic subluxation. Multilevel cervical spondylosis noted. Paravertebral soft tissues are unremarkable. Imaged lung apices are unremarkable.     IMPRESSION: 1. No acute intracranial abnormality. 2. No acute  fracture or traumatic subluxation of the cervical spine.     CT SPINE CERV WO CONT    Result Date: 10/21/2020  EXAM:  CT SPINE CERV WO CONT, CT HEAD WO CONT INDICATION: Recurrent falls.  History of subdural.  History of rheumatoid arthritis COMPARISON: 2019 TECHNIQUE: CT axial images of the brain and cervical spine without contrast. Coronal and sagittal reconstructed images were performed. All CT exams at this facility use  one or more dose reduction techniques including automatic exposure control, mA/kV adjustment per patient's size, or iterative reconstruction technique. WORKSTATION ID: [id number] FINDINGS: BRAIN: No acute intracranial hemorrhage, mass effect, or CT evidence of acute infarct. Mild diffuse parenchymal volume loss. No hydrocephalus. There is subcortical and periventricular hypodensity compatible with gliosis due to nonacute ischemic change. Orbits are unremarkable.  Paranasal sinuses and mastoid air cells are unremarkable. Calvarium intact. CERVICAL SPINE: No fracture or traumatic subluxation. Multilevel cervical spondylosis noted. Paravertebral soft tissues are unremarkable. Imaged lung apices are unremarkable.     IMPRESSION: 1. No acute intracranial abnormality. 2. No acute fracture or traumatic subluxation of the cervical spine.      ED Course/Medical Decision Making   WBC normal at 5.8.  Hemoglobin hematocrit are normal.  Platelets are normal.  Differential with 61% neutrophils and 23% lymphocytes.  INR is 1.4.  Potassium is little bit low at 3.0.  Electrolytes are otherwise unremarkable.  Renal function normal.  Glucose normal.  AST is 58 and total bilirubin is elevated at 1.7.  LFTs otherwise unremarkable.  Serum ammonia is trending downward.  It was 61 on 10/10/2020 and it was 106 on 09/24/2020.  TSH is elevated at 9.565.  This is a slight improvement from it was checked about a month ago.  Free T4 is a little bit low at 0.66.  This would be consistent with hypothyroidism.  I do not think this  is a myxedema coma.    I reviewed the x-rays of the left elbow.  There are no pathologic posterior fat pads.  I thought there may have been a radial head/neck fracture however radiology sees no acute fracture.  I saw degenerative changes at the left shoulder but I do not see any fracture or dislocation.  There is hardware in the left hip with wires wrapping around the femoral portion of the total hip replacement.  I do not see any fractures or hardware failure.  I do not see any pelvic or pubic ramus fractures either.    I reviewed the head CT images myself.  I do not see any evidence of extra-axial fluid collections or hemorrhage.  No obvious stroke.  I reviewed the radiology reports for the C-spine CT as well as the head CT.     Given the patient's living arrangements and a senior apartment and recurrent falls I think discharge is unsafe.  I have talked with her primary care provider, Dr. Willis Modena.  He agrees.    Dr. Colen Darling has seen the patient in the emergency department.  Procedures  Final Diagnosis       ICD-10-CM ICD-9-CM   1. Metabolic encephalopathy  N56.21 348.31   2. Multiple falls  R29.6 V15.88   3. Contusion of left elbow, initial encounter  S50.02XA 923.11   4. Contusion of left hip, initial encounter  S70.02XA 924.01       Disposition   Admission observation status MedSurg bed      Serita Sheller, [name]  October 21, 2020    My signature above authenticates this document and my orders, the final    diagnosis (es), discharge prescription (s), and instructions in the Epic    record.  If you have any questions please contact [phone].     Nursing notes have been reviewed by the physician/ advanced practice    Clinician.  Dragon medical dictation software was used for portions of this report. Unintended voice recognition errors may occur.

## 2020-10-22 ENCOUNTER — Inpatient Hospital Stay: Admit: 2020-10-22 | Payer: MEDICARE | Primary: Internal Medicine

## 2020-10-22 LAB — COVID-19 (INPATIENT TESTING): COVID-19: POSITIVE — AB

## 2020-10-22 LAB — COVID-19: COVID-19: POSITIVE — AB

## 2020-10-22 MED FILL — FOLIC ACID 1 MG TAB: 1 mg | ORAL | Qty: 1

## 2020-10-22 MED FILL — HEPARIN (PORCINE) 5,000 UNIT/ML IJ SOLN: 5000 unit/mL | INTRAMUSCULAR | Qty: 1

## 2020-10-22 MED FILL — MELATONIN 3 MG TAB: 3 mg | ORAL | Qty: 1

## 2020-10-22 MED FILL — METOPROLOL TARTRATE 50 MG TAB: 50 mg | ORAL | Qty: 1

## 2020-10-22 MED FILL — MAGNESIUM OXIDE 400 MG TAB: 400 mg | ORAL | Qty: 1

## 2020-10-22 MED FILL — LEVOTHYROXINE 125 MCG TAB: 125 mcg | ORAL | Qty: 1

## 2020-10-22 MED FILL — FUROSEMIDE 40 MG TAB: 40 mg | ORAL | Qty: 1

## 2020-10-22 MED FILL — POTASSIUM CHLORIDE SR 20 MEQ TAB, PARTICLES/CRYSTALS: 20 mEq | ORAL | Qty: 2

## 2020-10-22 MED FILL — LISINOPRIL 5 MG TAB: 5 mg | ORAL | Qty: 2

## 2020-10-22 MED FILL — LACTULOSE 20 GRAM/30 ML ORAL SOLUTION: 20 gram/30 mL | ORAL | Qty: 30

## 2020-10-22 MED FILL — OYSTER SHELL CALCIUM-VITAMIN D3 500 MG-5 MCG (200 UNIT) TABLET: 500 mg-5 mcg (200 unit) | ORAL | Qty: 1

## 2020-10-22 NOTE — Progress Notes (Signed)
 OCCUPATIONAL THERAPY EVALUATION     Patient: [name] [name] (87 y.o. female)  Room: 6623/6623  Primary Diagnosis: Metabolic encephalopathy [G93.41]  Multiple falls [R29.6]  Contusion of left hip [S70.02XA]  Left elbow contusion [S50.02XA]         Date: 10/22/2020  In time:  9:29        Out time:  9:55    Isolation:  Droplet Plus       MDRO: COVID-19  Precautions: falls, COVID (+)    Ordered weight bearing precautions: None    Orders, labs, and chart reviewed. Communicated with nursing staff. Patient cleared to participate.    ASSESSMENT:      Based on the objective data described below, the patient presents with   - generalized muscle weakness affecting function in ADLs  - bilateral lower extremity weakness  - decreased functional sitting and standing balance    - decreased functional mobility   - unsteady in functional mobility, transfers, standing, and sitting   - decreased sitting and standing tolerance  - decreased tolerance to sustained activity  - deconditioning  - increased pain in left  knee   - decreased cognition   - decreased safety awareness  - decreased flexibility  - decreased ability to do cross leg technique for lower body dressing   - bed mobility with Min A  - sit <> stand with Min A, RW, gait belt  - in room navigation in prep for toileting with CGA, RW, gait belt, verbal cues for safety  - standing level grooming (tooth brushing) at sink level with CGA  - UB dressing at sitting level with SBA, set-up  - LB dressing with Max A at EOB, pt reports that she requires assist at baseline    affecting patient's ability to safely and independently perform basic ADLs/IADLs.    Patient will benefit from skilled occupational therapy intervention to address the above impairments.    Patient's rehabilitation potential is considered to be Good.       PLAN : Patient and/or family have participated as able in goal setting and plan of care.  Planned Interventions: Adaptive equipment, ADI training, activity  tolerance, functional balance training, functional mobility training, therapeutic exercise, therapeutic activity, and energy conservation.  Frequency/Duration: Patient to be seen 1-5x/week x 4 weeks.  Discharge Recommendations:  SNF vs Return to [facility] with Outpatient OT/PT  Further Equipment Recommendations for Discharge: no needs anticipated; has DME available at home     Orders, labs, and chart reviewed on [name] [name].     Patient was admitted to the hospital on 10/21/2020 with   Chief Complaint   Patient presents with    Fall    Hip Pain     Present illness history:   Patient Active Problem List    Diagnosis Date Noted    Metabolic encephalopathy 10/21/2020    Contusion of left hip 10/21/2020    Left elbow contusion 10/21/2020    Multiple falls 10/21/2020    Subdural hematoma caused by concussion (HCC) 08/27/2017    Subdural hematoma (HCC) 08/27/2017    Deep venous thrombosis (HCC)       Previous medical history:   Past Medical History:   Diagnosis Date    Arthritis     Deep venous thrombosis (HCC)     Endocrine disease     hypothyroid    Hemochromatosis     Hypertension        PRIOR LEVEL OF FUNCTION:  Information was obtained by:  patient, chart review  Home environment:  Patient is from [place] at [place]      Prior level of function:Patient is independent in basic ADLS.  Patient ambulates with rollator.  Prior level of Instrumental Activities of Daily Living:   performs light IADL's within personal apartment, meals provided at facility  Home equipment: shower chair, grab bars, rollator    EDUCATION:     Barriers to learning/limitations: None  Education provided to: patient on (+) role of OT, (+) OT plan of care, (+) Instructed patient in the benefits of maintaining activity tolerance, functional mobility, and independence with self care tasks during acute stay  to ensure safe return home and to baseline. Encouraged patient to increase frequency and duration OOB, be out of bed for all meals, perform  daily ADLs (as approved by RN/MD regarding bathing etc), and performing functional mobility to/from bathroom with staff assistance as needed., (+) instructed patient on the importance of activity while hospitalized to prevent a decline in function, (+) encouraged patient to sit up in chair for 45 (+) minutes or as tolerated 2-3 times a day, with staff assistance as needed, (+) the importance of maintaining UE muscle strength and activity tolerance while hospitalized to prevent a decline in function, (+) staff assistance with mobility, (+) change positions frequently, (+) functional mobility, (+) discharge disposition/recommendations, (+) ADL training, (+) safety  Educational handouts issued: none this session  Patient / family response to education: verbalized understanding, demonstrated understanding  Team Communication: Discussed with Hope, RN     SUBJECTIVE:   Patient See I've fallen before.  --------------------------------------------------------------------------------------------------  Pain Assessment: Not rated  Pain Location:  L knee    OBJECTIVE DATA SUMMARY:     Patient found Bed, (+) bed/chair exit alarm    Patient received / participated in 15 minutes of treatment (therapeutic activity, functional mobility retraining, functional balance retraining, ADI retraining, activity tolerance) and/or educational instruction during/immediately following OT evaluation    Cognition     Mental status:   Orientation: Patient is oriented x 3   Neuro state: awake, alert, and pleasant  Communication: normal, speech and language intact  Attention Span:  good (>24min)  Follows commands: intact, attentive, interacts appropriately  Safety/Judgement: needs cueing for safety and precautions  General Cognition: slow processing and delayed responses.  Hearing: grossly intact.  Vision:  grossly intact.    Activities of Daily Living    Eating:           - independent, set-up in hospital setting  Grooming:     - contact guard  assistance, set-up, increased time, safety considerations, brushing teeth while standing at sink level  UB bathing:   - minimum assistance  LB bathing:   - moderate assistance  UB dressing: - minimum assistance  LB dressing: - moderate assistance  Toileting:       - contact guard assistance    Mobility    Mobility:  Supine to sit -  minimum assistance  Sit to supine -  moderate assistance, assist to manage LE's d/t pain  Sit to stand -  minimum assistance, rolling walker, gait belt, set up, increased time, safety considerations  Stand to sit -  minimum assistance, rolling walker, gait belt, set up, increased time, safety considerations     Transfers:  Functional transfers: contact guard assistance, rolling walker, gait belt, set up, increased time, safety considerations, in room navigation in prep for toileting     Functional Balance  Static Sitting Balance -           good:       maintains balance against moderate resistance  Dynamic Sitting Balance -      fair:          able to perform full UE ROM ranges with CGA/SBA  Static Standing Balance -       fair (-):     contact guard assistance to maintain balance  Dynamic Standing Balance -  fair (-):     able to perform partial UE ROM ranges with CGA    Activity Tolerance:   - good tolerance to activity during session  - patient is motivated to increase activity  - activity is limited by pain    Upper Extremity Function    Dominance:right  RIGHT: ACTIVE range of motion is WNL.  Strength is grossly graded as 4+/5: (Completes full range of motion against gravity with moderate to maximal resistance)    LEFT:   ACTIVE range of motion is WNL.  Strength is grossly graded as 4+/5: (Completes full range of motion against gravity with moderate to maximal resistance)      Comment(s):   none    Assessment of Activities of Daily Living:        Barthel Index for Activities of Daily Living (ADL) (an objective, standardized tool for measuring functional status).  Assesses  functional disability by quantifying patient performance in 10 activities of daily life (ADLs), (scored in increments of 5 points). Record actual, not potential, functioning. Information can be obtained from the patent's self-report, a separate party who is familiar with the patient's abilities (such as a relative/caregiver), or observation.    Current Score Scoring Range   Feeding: 10 0 - 10   2.   Bathing: 0 0 - 5   3.   Grooming: 5 0 - 5   4.   Dressing: 5 0 - 10   5.   Bladder: 10 0 - 10    6.   Bowels: 10 0 - 10   7.   Toilet Use: 5 0 - 10   8.   Transfer (Bed to Chair and Back): 10  0 - 15   9.   Mobility: 5 0 - 15   10. Stairs: 0 0 - 10   Total: 60 / 100    The Sinoff 1997 Interpretation:  No. of Points Status   80-100 Independent   60-79 Minimally dependent   40-59 Partially dependent   20-39 Very dependent   <20 Totally dependent     References  Mahoney FI, Barthel D. Functional evaluation: the Barthel Index.  [place]  State Medical Journal 1965;14:56-61. Used with permission.    Sinoff G, Ore L. The Barthel activities of daily living index: self-reporting versus actual performance in the old-old (> or = 75 years). J Am Geriatr Soc. [phone]; 45(7):832-6.     Final Location: bed, bed alarm, all needs close, and agrees to call for assistance.    Occupational Therapy Goals:   OT goals initiated 10/22/2020 and will be met by patient within 10 sessions     - Patient will perform Upper Body Dressing with supervision and DME/AE PRN.   - Patient will perform Lower Body Dressing with modified independence and DME/AE PRN.  - Patient will perform all aspects of toileting with modified independence and DME/AE PRN.  - Patient will perform Upper Body Bathing with supervision and DME/AE PRN.  - Patient will perform Lower  Body Bathing with minimum assistance and DME/AE PRN  - Patient will navigate a community distance (>50 feet) to address community mobility with supervision and DME PRN.     ______________________________________________________________________________    Thank you for this referral.    Nat CHRISTELLA Desanctis, OTD, OTR/L

## 2020-10-22 NOTE — Progress Notes (Signed)
Progress Notes by Townsend Roger, PT at 10/22/20 1432                Author: Townsend Roger, PT  Service: Physical Therapy  Author Type: Physical Therapist       Filed: 10/22/20 1709  Date of Service: 10/22/20 1432  Status: Signed          Editor: Townsend Roger, PT (Physical Therapist)               PHYSICAL THERAPY EVALUATION      Patient: [name] [name] (87 y.o. female)   Room: 6623/6623      Date: 10/22/2020   Start Time:  13:15   End Time:  14:00      Primary Diagnosis: Metabolic encephalopathy [G93.41]   Multiple falls [R29.6]   Contusion of left hip [S70.02XA]   Left elbow contusion [S50.02XA]            Precautions: Bed Alarm, Falls, and Hospital Bed.   Weight bearing precautions: None           ASSESSMENT :   Based on the objective data described below, the patient presents with   Pain and ROM limitations B knees due to RA   MIN A for bed mobility using hospital bed   MIN A for sit to stand from edge of bed and toilet using rolling walker.   CGA for ambulation w/rolling walker 10 feet x 2 with short stride and decreased step.   Generalized muscle weakness affecting function    Decreased Strength   Decreased ADL/Functional Activities   Decreased Transfer Abilities   Decreased Ambulation Ability/Technique   Decreased Balance   Increased Pain   Decreased Activity Tolerance   Increased Fatigue   Decreased Flexibility/Joint Mobility   Decreased Independence with Home Exercise Program.         Patients rehabilitation potential is considered to be Good       Recommendations:   Physical Therapy and Occupational Therapy   Discharge Recommendations: TBD, pending progress   Further Equipment Recommendations for Discharge: None              PLAN :   Planned Interventions:   Functional mobility training Gait Training Balance Training Therapeutic exercises Therapeutic activities Patient/caregiver education         Frequency/Duration: Patient will be followed by physical therapy  3 to 5 times/week for 1  weeks to address  goals.                               Orders reviewed, chart reviewed on [facility].  Discussed with nurse, who stated that pt could have PT visit.        SUBJECTIVE:     Patient: states that she is having increased pain in B knees, esp R knee, due to RA.  Usually L knee is more painful.  Is not receiving any pain medication other than acetaminophen.   Reports 3 falls in the past few weeks, does not remember cause of falls, and had no major injury, just bruising.          OBJECTIVE DATA SUMMARY:     Present illness history:       Problem List   Never Reviewed                           Codes  Class  Noted             Metabolic encephalopathy  ICD-10-CM: G93.41   ICD-9-CM: 348.31    10/21/2020                       Contusion of left hip  ICD-10-CM: S70.02XA   ICD-9-CM: 924.01    10/21/2020                       Left elbow contusion  ICD-10-CM: S50.02XA   ICD-9-CM: 923.11    10/21/2020                       Multiple falls  ICD-10-CM: R29.6   ICD-9-CM: V15.88    10/21/2020                       Deep venous thrombosis (HCC)  ICD-10-CM: I82.409   ICD-9-CM: 453.40    Unknown                       Subdural hematoma caused by concussion [facility](HCC)  ICD-10-CM: Z61.0R6ES06.5X9A   ICD-9-CM: 852.29    08/27/2017                       Subdural hematoma (HCC)  ICD-10-CM: A54.0J8JS06.5X9A   ICD-9-CM: 432.1    08/27/2017                   Past Medical history:      Past Medical History:        Diagnosis  Date         ?  Arthritis       ?  Deep venous thrombosis (HCC)       ?  Endocrine disease            hypothyroid         ?  Hemochromatosis           ?  Hypertension             Prior Level of Function/Home Situation:    Home environment: Lives alone, Assisted Living, Engineer, structurallevator.   Prior level of function: Retired, unable to drive, but able to sit in car, independent with ADLs, and Ambulates with device   Home equipment: rollator, shower chair, grab bars.         Patient found: Bed and Bed alarm.      Pain Assessment before PT session: 5/10    Pain Location: R>L knee   Pain Assessment after PT session: 6/10   Pain Location:     [x]            Yes, patient had pain medications   []           No, Patient has not had pain medications   []           Nurse notified        COGNITIVE STATUS:        Mental Status: Oriented x3 and alert.   Communication: normal.   Follows commands: intact.   General Cognition: intact .   Hearing: grossly intact.   Vision:  impaired and glasses.           EXTREMITIES ASSESSMENT:         Tone & Sensation:    Normal      Proprioception:    Intact  Strength:     Generally 3/5, limited MMT due to pain from RA         Range Of Motion:   Decreased throughout.  B knees 60 deg flexion, ext 20 deg, hips 80 deg flex.           Functional mobility and balance status:        Rolling -  min. assist and rail   Supine to sit -  min. assist, rail, and HOB evaluated    Sit to Supine -  min. assist, 1 person, rail, and HOB elevated   Sit to Stand -  mod. assist   Stand to Sit -  min. assist   Toilet  -  min. assist, 1 person, grab bars, and rolling walker  w/verbal cues         Balance:    Static Sitting Balance -  good   Dynamic Sitting Balance -  good   Static Standing Balance -  fair+ and w/ assistive device   Dynamic Standing Balance -  fair and w/ assistive device      Tinetti Balance & Gait Assessment      BALANCE       Sitting Balance  Leans or slides in chair = 0   Steady, safe = 1  1         Rises from sitting  Unable to without help = 0   Able, uses arms to help = 1   Able without use of arms = 2  0     Attempts to rise  Unable to without help = 0   Able, requires > 1 attempt = 1   Able to rise, 1 attempt = 2  0     Immediate standing   balance (first 5 seconds)  Unsteady (staggers, moves feet, trunk sway) = 0   Steady but uses walker or other support = 1   Steady without walker or other support = 2  1     Standing balance  Unsteady = 0   Steady but wide stance and uses support = 1   Narrow stance without support = 2  1     Sternal nudge   Begins to fall = 0   Staggers, grabs, catches self = 1   Steady = 2  1     Eyes closed  Unsteady = 0   Steady = 1  1         Turning 360 degrees  Discontinuous steps = 0   Continuous = 1  0           Unsteady (grabs, staggers) = 0   Steady = 1  1         Sitting down  Unsafe (misjudged distance, falls into chair) = 0   Uses arms or not a smooth motion = 1   Safe, smooth motion = 2  1           Balance Score  7/16           GAIT       Indication of gait   (Immediately after told to go.)  Any hesitancy or multiple attempts = 0   No hesitancy = 1  1     Step length and height  Step to = 0   Step through R = 1   Step through L = 1  0     Foot clearance  Foot drop = 0  L foot clears floor = 1   R foot clears floor = 1  0     Step symmetry  Right and left step length not equal = 0   Right and left step length appear equal = 1  0     Step continuity  Stopping or discontinuity between steps = 0   Steps appear continuous = 1  1     Path  Marked deviation = 0   Mild/moderate deviation or uses walking aid = 1   Straight without walking aid = 2  1     Trunk  Marked sway or uses walking aid = 0   No sway but flex. knees or back or uses arms for stability = 1   No sway, no flexion, no use of UE, and no use of walking aid    = 2  0     Base of support  Heels apart = 0   Heels almost touching while walking = 1  0           Gait Score  3/12       Balance Score Carried Forward  7/16           Total Score  10/28        Interpretation of Score:    High falls risk:         < 18   Moderate falls risk: 19-23   Low falls risk:           > 24    Functional Status Score for the Intensive Care Unit (FSS-ICU)      Task   Score     1.  Rolling  4 - Min assist (patient performs 75%+ or more of the work)     2. Supine to Sit Transfer  4 - Min assist (patient performs 75% or more of the work)     3. Sit to Stand Transfer  3 - Mod assist (patient performs 26-74% of the work)     4. Sitting Edge of Bed  6 - Requires hands to balance     5. Walking   1 - Walks <50 feet with the assistance of 1 person OR requires the assistance of 2 people to assist with ambulation of any distance     TOTAL SCORE:  18        INITIAL TOTAL SCORE:  18/35             PT goals: (1 week)        1. Pt will be independent with bed mobility  in preparation for OOB activities   2. Pt will be able to transfer with supervision in preparation for OOB activities and ambulation.    3. Pt will be able to ambulate a distance of 100 feet using least restrictive device with SBA x 1 to promote functional independence   4. Pt will be supervision with LE exercises x 10-15 reps each to increase strength and endurance   5. Increase strength BLE by 1/2 to1 grade higher to promote functional independence, improve ADLs/mobility and reduce risk for falls   6. Improve standing balance to fair plus to promote upright posture, functional independence, improve ADLs/mobility and reduce risk for falls   7. Tolerate OOB to chair for all meals   8. Patient will demonstrate good activity tolerance during functional activities.    9. Patient will state/observe falls precautions   10. Patient will utilize energy conservation techniques  during functional activities   11. Increase FSS score to 25/35  to demonstrate increased independence with functional mobility          Ambulation/Gait Training:   steady, shuffled, antalgic, downward gaze, flexed posture, and short stride.  Rolling walker and Gait Belt    20 feet with CGA of 1.      Stair Training:   not tested           Therapeutic Exercises:      Patient received/participated in 15 minutes of treatment (therapeutic exercises/activities)  immediately following evaluation and/or educational instruction during/immediately following PT evaluation.      Lower Extremities:   Supine, Seated, Heel Slide, Long arc quad, Hip ab/duction, Ankle pumps, BLE, and 10 reps         Activity Tolerance:    fair-      Final Location:    bed, bed alarm, all needs close, and agrees to  call for assistance        COMMUNICATION/EDUCATION:     Education: Patient, Benefit of activity while hospitalized, Call for assistance, Out of bed 2-3 times/day, Staff assistance  with mobility, Bathroom safety, Energy conservation, HEP, Functional mobility, Demonstrates adequately, Verbalized understanding, Verbal, and Role of PT   Barriers to Learning/Limitations: None      Please refer to care plan and patient education section for further details.      Thank you for this referral.   Townsend Roger, PT,

## 2020-10-22 NOTE — Progress Notes (Signed)
Progress Note             Daily Progress Note: 10/22/2020    Assessment/Plan:        Minimal non-productive cough.   C/o right knee pain today (yesterday was more on left).   No chest pain, N/V, abd pain, or diarrhea.     Afebrile.   O2 sat 94% on RA.      Acute Covid-19 infection.   No hypoxia.   PCXR.   Has received 2 vaccines and one booster.  Multiple falls.   PT, OT consults.  Encephalopathy, hepatic +/- contribution from hypothyroidism and #1.   Pt oriented X 3 this am but forgot she had US liver done this am.  Hepatic encephalopathy.   History of confusion and hallucinations over the last two months.   Improvement in mental status coinciding with improved ammonia levels on Lactulose.   Dose increased this admission.           [place]:   Prominent periportal echoes, no mass.     Hx hemochromatosis receiving periodic phlebotomies.   Also has been on Methotrexate for years for RA.   Vitamin D level was also top nl recently.    Both Methotrexate and Vitamin D were held about a month ago but MTX was on admission list.   (?)   Will consult GI for recs in evaluation and re: safety of Methotrexate.       Pt states she had a liver biopsy about 17 years ago for elevated liver enzymes, ?prior to starting MTX.     5.   RA  6.   Hypothyroidism, inadequate replacement, dose increased this admission.  7.   Hx subdural hematoma 08/2017, not requiring surgery.         Subjective:      Review of Systems:  ROS    Objective:   Physical Exam: Physical Exam  Cardiovascular:      Rate and Rhythm: Normal rate and regular rhythm.   Pulmonary:      Effort: Pulmonary effort is normal.      Breath sounds: Normal breath sounds.   Abdominal:      Palpations: Abdomen is soft.      Tenderness: There is no abdominal tenderness.   Neurological:      Mental Status: She is alert.       Visit Vitals  BP (!) 149/84 (BP Patient Position: Lying)   Pulse 71   Temp 98.2 ??F (36.8 ??C)   Resp 20   Ht '5\' 6"'  (1.676 m)   Wt 76.2 kg (168 lb)   SpO2 94%    Breastfeeding No   BMI 27.12 kg/m??      O2 Device: None (Room air)    Temp (24hrs), Avg:98.5 ??F (36.9 ??C), Min:98.2 ??F (36.8 ??C), Max:98.8 ??F (37.1 ??C)    No intake/output data recorded.   08/07 1901 - 08/09 0700  In: 237 [P.O.:237]  Out: -       Data Review:       24 Hour Results:  Recent Results (from the past 24 hour(s))   CBC WITH AUTOMATED DIFF    Collection Time: 10/21/20 12:52 PM   Result Value Ref Range    WBC 5.8 4.0 - 11.0 1000/mm3    RBC 3.89 3.60 - 5.20 M/uL    HGB 13.3 13.0 - 17.2 gm/dl    HCT 39.1 37.0 - 50.0 %    MCV 100.5 (H) 80.0 - 98.0 fL  MCH 34.2 25.4 - 34.6 pg    MCHC 34.0 30.0 - 36.0 gm/dl    PLATELET 151 140 - 450 1000/mm3    MPV 10.1 (H) 6.0 - 10.0 fL    RDW-SD 50.7 (H) 36.4 - 46.3      NRBC 0 0 - 0      IMMATURE GRANULOCYTES 0.3 0.0 - 3.0 %    NEUTROPHILS 61.0 34 - 64 %    LYMPHOCYTES 23.1 (L) 28 - 48 %    MONOCYTES 14.4 (H) 1 - 13 %    EOSINOPHILS 0.9 0 - 5 %    BASOPHILS 0.3 0 - 3 %   PROTHROMBIN TIME + INR    Collection Time: 10/21/20 12:52 PM   Result Value Ref Range    Prothrombin time 16.6 (H) 10.2 - 12.9 seconds    INR 1.4 (H) 0.1 - 1.1     MAGNESIUM    Collection Time: 10/21/20 12:52 PM   Result Value Ref Range    Magnesium 1.7 1.6 - 2.6 mg/dL   AMMONIA    Collection Time: 10/21/20 12:52 PM   Result Value Ref Range    Ammonia, plasma 45.0 (H) 11.2 - 16.0 umol/L   METABOLIC PANEL, BASIC    Collection Time: 10/21/20 12:52 PM   Result Value Ref Range    Potassium 3.0 (L) 3.5 - 5.1 mEq/L    Chloride 102 98 - 107 mEq/L    Sodium 139 136 - 145 mEq/L    CO2 28 20 - 31 mEq/L    Glucose 85 74 - 106 mg/dl    BUN 10 9 - 23 mg/dl    Creatinine 0.71 0.55 - 1.02 mg/dl    GFR est AA >60.0      GFR est non-AA >60      Calcium 8.6 (L) 8.7 - 10.4 mg/dl    Anion gap 9 5 - 15 mmol/L   HEPATIC FUNCTION PANEL    Collection Time: 10/21/20 12:52 PM   Result Value Ref Range    AST (SGOT) 58.0 (H) 0.0 - 33.9 U/L    ALT (SGPT) 15 10 - 49 U/L    Alk. phosphatase 98 46 - 116 U/L    Bilirubin, total 1.70 (H)  0.30 - 1.20 mg/dl    Protein, total 6.1 5.7 - 8.2 gm/dl    Albumin 3.5 3.4 - 5.0 gm/dl    Bilirubin, direct 0.7 (H) 0.0 - 0.3 mg/dl   TSH 3RD GENERATION    Collection Time: 10/21/20 12:52 PM   Result Value Ref Range    TSH 9.565 (H) 0.550 - 4.780 uIU/mL   T4, FREE    Collection Time: 10/21/20 12:52 PM   Result Value Ref Range    Free T4 0.66 (L) 0.89 - 1.76 ng/dl   COVID-19 (INPATIENT TESTING)    Collection Time: 10/21/20  6:18 PM    Specimen: NASOPHARYNGEAL SWAB   Result Value Ref Range    COVID-19 POSITIVE (A) NEGATIVE         Problem List:  Problem List as of 10/22/2020 Never Reviewed            Codes Class Noted - Resolved    Metabolic encephalopathy FUX-32-TF: G93.41  ICD-9-CM: 348.31  10/21/2020 - Present        Contusion of left hip ICD-10-CM: T73.22GU  ICD-9-CM: 924.01  10/21/2020 - Present        Left elbow contusion ICD-10-CM: R42.70WC  ICD-9-CM: 923.11  10/21/2020 - Present  Multiple falls ICD-10-CM: R29.6  ICD-9-CM: V15.88  10/21/2020 - Present        Deep venous thrombosis (HCC) ICD-10-CM: I82.409  ICD-9-CM: 453.40  Unknown - Present        Subdural hematoma caused by concussion [facility]) ICD-10-CM: Z61.0R6E  ICD-9-CM: 852.29  08/27/2017 - Present        Subdural hematoma (HCC) ICD-10-CM: A54.0J8J  ICD-9-CM: 432.1  08/27/2017 - Present           Medications reviewed  Current Facility-Administered Medications   Medication Dose Route Frequency    lisinopriL (PRINIVIL, ZESTRIL) tablet 10 mg  10 mg Oral DAILY    levothyroxine (SYNTHROID) tablet 125 mcg  125 mcg Oral 6am    furosemide (LASIX) tablet 40 mg  40 mg Oral DAILY    metoprolol tartrate (LOPRESSOR) tablet 50 mg  50 mg Oral BID    albuterol-ipratropium (DUO-NEB) 2.5 MG-0.5 MG/3 ML  3 mL Nebulization Q4H PRN    [START ON 10/23/2020] furosemide (LASIX) tablet 40 mg  40 mg Oral Q MON, WED & FRI    lactulose (CHRONULAC) 10 gram/15 mL solution 30 mL  20 g Oral DAILY    magnesium oxide (MAG-OX) tablet 400 mg  400 mg Oral DAILY    melatonin tablet 3 mg  3 mg Oral QHS     potassium chloride (K-DUR, KLOR-CON M20) SR tablet 40 mEq  40 mEq Oral DAILY    calcium-vitamin D (OS-CAL +D3) 500 mg-200 unit per tablet 1 Tablet  1 Tablet Oral DAILY WITH LUNCH    [Held by provider] hydrOXYchloroQUINE (PLAQUENIL) tablet 200 mg  200 mg Oral BID    [Held by provider] folic acid (FOLVITE) tablet 1 mg  1 mg Oral DAILY    naloxone (NARCAN) injection 0.1 mg  0.1 mg IntraVENous PRN    acetaminophen (TYLENOL) tablet 650 mg  650 mg Oral Q4H PRN    Or    acetaminophen (TYLENOL) solution 650 mg  650 mg Oral Q4H PRN    Or    acetaminophen (TYLENOL) suppository 650 mg  650 mg Rectal Q4H PRN    HYDROcodone-acetaminophen (NORCO) 5-325 mg per tablet 1 Tablet  1 Tablet Oral Q6H PRN    ondansetron (ZOFRAN) injection 4 mg  4 mg IntraVENous Q4H PRN    heparin (porcine) injection 5,000 Units  5,000 Units SubCUTAneous Q12H       Care Plan discussed with: Patient/Family    Total time spent with patient: 40 minutes.    Colen Darling, MD  October 22, 2020

## 2020-10-23 LAB — METABOLIC PANEL, COMPREHENSIVE
ALT (SGPT): 14 U/L (ref 10–49)
AST (SGOT): 44 U/L — ABNORMAL HIGH (ref 0.0–33.9)
Albumin: 3.1 gm/dl — ABNORMAL LOW (ref 3.4–5.0)
Alk. phosphatase: 88 U/L (ref 46–116)
Anion gap: 11 mmol/L (ref 5–15)
BUN: 13 mg/dl (ref 9–23)
Bilirubin, total: 1.7 mg/dl — ABNORMAL HIGH (ref 0.30–1.20)
CO2: 25 mEq/L (ref 20–31)
Calcium: 8.9 mg/dl (ref 8.7–10.4)
Chloride: 100 mEq/L (ref 98–107)
Creatinine: 0.66 mg/dl (ref 0.55–1.02)
GFR est AA: >60
GFR est non-AA: >60
Glucose: 103 mg/dl (ref 74–106)
Potassium: 3.2 mEq/L — ABNORMAL LOW (ref 3.5–5.1)
Protein, total: 5.6 gm/dl — ABNORMAL LOW (ref 5.7–8.2)
Sodium: 135 mEq/L — ABNORMAL LOW (ref 136–145)

## 2020-10-23 LAB — CBC WITH AUTOMATED DIFF
BASOPHILS: 0.3 % (ref 0–3)
EOSINOPHILS: 2.5 % (ref 0–5)
HCT: 35.6 % — ABNORMAL LOW (ref 37.0–50.0)
HGB: 11.9 gm/dl — ABNORMAL LOW (ref 13.0–17.2)
IMMATURE GRANULOCYTES: 0.3 % (ref 0.0–3.0)
LYMPHOCYTES: 32.3 % (ref 28–48)
MCH: 34.2 pg (ref 25.4–34.6)
MCHC: 33.4 gm/dl (ref 30.0–36.0)
MCV: 102.3 fL — ABNORMAL HIGH (ref 80.0–98.0)
MONOCYTES: 14.5 % — ABNORMAL HIGH (ref 1–13)
MPV: 10.7 fL — ABNORMAL HIGH (ref 6.0–10.0)
NEUTROPHILS: 50.1 % (ref 34–64)
NRBC: 0 (ref 0–0)
PLATELET: 159 10*3/uL (ref 140–450)
RBC: 3.48 M/uL — ABNORMAL LOW (ref 3.60–5.20)
RDW-SD: 52.6 — ABNORMAL HIGH (ref 36.4–46.3)
WBC: 6 10*3/uL (ref 4.0–11.0)

## 2020-10-23 LAB — C REACTIVE PROTEIN, QT: C-Reactive protein: 82 mg/L — ABNORMAL HIGH (ref 0.00–9.90)

## 2020-10-23 LAB — FIBRINOGEN
Fibrinogen: 386 mg/dl (ref 220–397)
Fibrinogen: 386 mg/dl (ref 220–397)

## 2020-10-23 LAB — PROCALCITONIN
PROCALCITONIN: 0.21 ng/ml (ref 0.00–0.50)
PROCALCITONIN: 0.21 ng/ml (ref 0.00–0.50)

## 2020-10-23 LAB — TRIGLYCERIDE: Triglyceride: 74 mg/dl (ref 0–150)

## 2020-10-23 LAB — AMMONIA
Ammonia, plasma: 52 umol/L — ABNORMAL HIGH (ref 11.2–31.7)
Ammonia: 52 umol/L — ABNORMAL HIGH (ref 11.2–31.7)

## 2020-10-23 LAB — FERRITIN
Ferritin: 124.1 ng/ml (ref 7.3–270.7)
Ferritin: 124.1 ng/ml (ref 7.3–270.7)

## 2020-10-23 LAB — MAGNESIUM
Magnesium: 1.3 mg/dL — ABNORMAL LOW (ref 1.6–2.6)
Magnesium: 1.3 mg/dL — ABNORMAL LOW (ref 1.6–2.6)

## 2020-10-23 LAB — LD: LD: 348 U/L — ABNORMAL HIGH (ref 120–246)

## 2020-10-23 LAB — D DIMER: D DIMER: 1.59 ug/mL (FEU) — ABNORMAL HIGH (ref 0.01–0.50)

## 2020-10-23 LAB — LACTIC ACID
LACTIC ACID: 1.4 mmol/L (ref 0.5–2.2)
Lactic Acid: 1.4 mmol/L (ref 0.5–2.2)

## 2020-10-23 LAB — CBC WITH AUTO DIFFERENTIAL
Basophils %: 0.3 % (ref 0–3)
Eosinophils %: 2.5 % (ref 0–5)
Hematocrit: 35.6 % — ABNORMAL LOW (ref 37.0–50.0)
Hemoglobin: 11.9 gm/dl — ABNORMAL LOW (ref 13.0–17.2)
Immature Granulocytes: 0.3 % (ref 0.0–3.0)
Lymphocytes %: 32.3 % (ref 28–48)
MCH: 34.2 pg (ref 25.4–34.6)
MCHC: 33.4 gm/dl (ref 30.0–36.0)
MCV: 102.3 fL — ABNORMAL HIGH (ref 80.0–98.0)
MPV: 10.7 fL — ABNORMAL HIGH (ref 6.0–10.0)
Monocytes %: 14.5 % — ABNORMAL HIGH (ref 1–13)
Neutrophils %: 50.1 % (ref 34–64)
Nucleated RBCs: 0 (ref 0–0)
Platelets: 159 10*3/uL (ref 140–450)
RBC: 3.48 M/uL — ABNORMAL LOW (ref 3.60–5.20)
RDW-SD: 52.6 — ABNORMAL HIGH (ref 36.4–46.3)
WBC: 6 10*3/uL (ref 4.0–11.0)

## 2020-10-23 LAB — COMPREHENSIVE METABOLIC PANEL
ALT: 14 U/L (ref 10–49)
AST: 44 U/L — ABNORMAL HIGH (ref 0.0–33.9)
Albumin: 3.1 gm/dl — ABNORMAL LOW (ref 3.4–5.0)
Alkaline Phosphatase: 88 U/L (ref 46–116)
Anion Gap: 11 mmol/L (ref 5–15)
BUN: 13 mg/dl (ref 9–23)
CO2: 25 mEq/L (ref 20–31)
Calcium: 8.9 mg/dl (ref 8.7–10.4)
Chloride: 100 mEq/L (ref 98–107)
Creatinine: 0.66 mg/dl (ref 0.55–1.02)
EGFR IF NonAfrican American: >60
GFR African American: >60
Glucose: 103 mg/dl (ref 74–106)
Potassium: 3.2 mEq/L — ABNORMAL LOW (ref 3.5–5.1)
Sodium: 135 mEq/L — ABNORMAL LOW (ref 136–145)
Total Bilirubin: 1.7 mg/dl — ABNORMAL HIGH (ref 0.30–1.20)
Total Protein: 5.6 gm/dl — ABNORMAL LOW (ref 5.7–8.2)

## 2020-10-23 LAB — LACTATE DEHYDROGENASE: LD: 348 U/L — ABNORMAL HIGH (ref 120–246)

## 2020-10-23 LAB — D-DIMER, QUANTITATIVE: D-Dimer, Quant: 1.59 ug/mL (FEU) — ABNORMAL HIGH (ref 0.01–0.50)

## 2020-10-23 LAB — TRIGLYCERIDES: Triglycerides: 74 mg/dl (ref 0–150)

## 2020-10-23 LAB — C-REACTIVE PROTEIN: CRP: 82 mg/L — ABNORMAL HIGH (ref 0.00–9.90)

## 2020-10-23 MED ORDER — ENOXAPARIN 40 MG/0.4 ML SUB-Q SYRINGE
40 mg/0.4 mL | SUBCUTANEOUS | Status: DC
Start: 2020-10-23 — End: 2020-11-01
  Administered 2020-10-23 – 2020-11-01 (×9): via SUBCUTANEOUS

## 2020-10-23 MED ORDER — POTASSIUM CHLORIDE SR 20 MEQ TAB, PARTICLES/CRYSTALS
20 mEq | Freq: Once | ORAL | Status: AC
Start: 2020-10-23 — End: 2020-10-23
  Administered 2020-10-23: 16:00:00 via ORAL

## 2020-10-23 MED ORDER — LISINOPRIL 20 MG TAB
20 mg | Freq: Every day | ORAL | Status: DC
Start: 2020-10-23 — End: 2020-10-24
  Administered 2020-10-24: 12:00:00 via ORAL

## 2020-10-23 MED ORDER — MAGNESIUM SULFATE 50 % (4 MEQ/ML) INJECTION
4 mEq/mL (50 %) | Freq: Once | INTRAMUSCULAR | Status: AC
Start: 2020-10-23 — End: 2020-10-23
  Administered 2020-10-23: 19:00:00 via INTRAVENOUS

## 2020-10-23 MED ORDER — CEFAZOLIN 1 GRAM SOLUTION FOR INJECTION
1 gram | Freq: Three times a day (TID) | INTRAMUSCULAR | Status: DC
Start: 2020-10-23 — End: 2020-10-25
  Administered 2020-10-23 – 2020-10-25 (×6): via INTRAVENOUS

## 2020-10-23 MED FILL — LEVOTHYROXINE 125 MCG TAB: 125 mcg | ORAL | Qty: 1

## 2020-10-23 MED FILL — ENOXAPARIN 40 MG/0.4 ML SUB-Q SYRINGE: 40 mg/0.4 mL | SUBCUTANEOUS | Qty: 0.4

## 2020-10-23 MED FILL — HEPARIN (PORCINE) 5,000 UNIT/ML IJ SOLN: 5000 unit/mL | INTRAMUSCULAR | Qty: 1

## 2020-10-23 MED FILL — METOPROLOL TARTRATE 50 MG TAB: 50 mg | ORAL | Qty: 1

## 2020-10-23 MED FILL — FUROSEMIDE 40 MG TAB: 40 mg | ORAL | Qty: 1

## 2020-10-23 MED FILL — LACTULOSE 20 GRAM/30 ML ORAL SOLUTION: 20 gram/30 mL | ORAL | Qty: 30

## 2020-10-23 MED FILL — CEFAZOLIN 1 GRAM SOLUTION FOR INJECTION: 1 gram | INTRAMUSCULAR | Qty: 1000

## 2020-10-23 MED FILL — MELATONIN 3 MG TAB: 3 mg | ORAL | Qty: 1

## 2020-10-23 MED FILL — MAGNESIUM SULFATE 50 % (4 MEQ/ML) INJECTION: 4 mEq/mL (50 %) | INTRAMUSCULAR | Qty: 6

## 2020-10-23 MED FILL — POTASSIUM CHLORIDE SR 20 MEQ TAB, PARTICLES/CRYSTALS: 20 mEq | ORAL | Qty: 2

## 2020-10-23 MED FILL — OYSTER SHELL CALCIUM-VITAMIN D3 500 MG-5 MCG (200 UNIT) TABLET: 500 mg-5 mcg (200 unit) | ORAL | Qty: 1

## 2020-10-23 MED FILL — MAGNESIUM OXIDE 400 MG TAB: 400 mg | ORAL | Qty: 1

## 2020-10-23 MED FILL — LISINOPRIL 5 MG TAB: 5 mg | ORAL | Qty: 2

## 2020-10-23 NOTE — Consults (Signed)
Consults by Raynelle JanGamsey,  Bekka Qian J, MD at 10/23/20 1407                Author: Raynelle JanGamsey, Carlena Ruybal J, MD  Service: Physician Assistant  Author Type: Physician       Filed: 10/23/20 1733  Date of Service: 10/23/20 1407  Status: Addendum          Editor: Raynelle JanGamsey, Ronica Vivian J, MD (Physician)          Related Notes: Original Note by Tana Coastote, Katelyne M, PA (Physician Assistant) filed at 10/23/20  1731            Consult Orders        1. IP CONSULT TO PHYSICIAN [[id number]][[id number]] ordered by Blenda BridegroomMauser, Thomas L, MD at 10/22/20 1718                                         Gastroenterology Consult          Patient: [name] MungoBarbara K Ohern  MRN: [id number]   SSN: [id number]          Date of Birth: 02/15/1934   Age: 87 y.o.   Sex: female           Assessment:     --Elevated LFTs, hyperbilirubinemia- possible early cirrhosis based on imaging, labs. This could be related to medications, fatty liver,hemochromotosis, other autoimmune causes. LFTs  seem to be improving.     -Child Pugh B   --Hemochromatosis- with serial phlebotomies, managed by hematology.   --Hepatic encephalopathy- improved, on lactulose currently.   --Rheumatoid Arthritis- Maintained on plaquenil and methotrexate historically.   --COVID 19         Plan:     --Given her history of chronic liver disease, and suspect possible cirrhosis, would recommend against the use of methotrexate as this is hepatoxic to the liver. Recommend to discuss  alternatives to this with her rheumatologist in the outpatient setting.   --In terms of hepatic encephalopathy, could continue low dose lactulose or consider switching to Xifaxan.   --No further inpatient workup recommended for evaluation of the liver. She can follow up in the outpatient setting for management of this.        Subjective:         [name] [name] is a 87 y.o. female who is being seen for encephalopathy. She was came to the ER 10/21/20 for altered mental status, weakness. Pmhx of hemochromatosis with regular phlebotomy, hypothyroidism. She was started  on lactulose in the outpatient  setting which helped but confusion was still present. Labs showed an AST 58, bilirubin 1.7, ammonia elevated. She was found to be COVID positive. We were asked to weigh in to determine if this was true hepatic encephalopathy and the safety of continued  Methotrexate use due to possible underlying liver disease. US of the abdomen showed  prominent periportal intrahepatic hepatic echoes, ay be seen in congestion or inflammation, possible normal  variant. She has a history of elevated LFTs years ago with biopsy but unable to recall result. She does not drink EtOH or take OTC supplements. Most recent labs show a Hgb 11.9, Hct 35.6, MCV 102. Bili 1.7, AST 44. INR 1.4, PT 16.6.          Hospital Problems   Never Reviewed  Codes  Class  Noted  POA              Metabolic encephalopathy  ICD-10-CM: G93.41   ICD-9-CM: 348.31    10/21/2020  Unknown                        Contusion of left hip  ICD-10-CM: G38.75IE   ICD-9-CM: 924.01    10/21/2020  Unknown                        Left elbow contusion  ICD-10-CM: S50.02XA   ICD-9-CM: 923.11    10/21/2020  Unknown                        Multiple falls  ICD-10-CM: R29.6   ICD-9-CM: V15.88    10/21/2020  Unknown                    Past Medical History:        Diagnosis  Date         ?  Arthritis       ?  Deep venous thrombosis (HCC)       ?  Endocrine disease            hypothyroid         ?  Hemochromatosis           ?  Hypertension            Past Surgical History:         Procedure  Laterality  Date          ?  HX ORTHOPAEDIC              left knee replacement         History reviewed. No pertinent family history.     Social History          Tobacco Use         ?  Smoking status:  Never     ?  Smokeless tobacco:  Never       Substance Use Topics         ?  Alcohol use:  No           Current Facility-Administered Medications             Medication  Dose  Route  Frequency  Provider  Last Rate  Last Admin              ?  enoxaparin  (LOVENOX) injection 40 mg   40 mg  SubCUTAneous  Q24H  Mauser, Thomas L, MD     40 mg at 10/23/20 1144     ?  magnesium sulfate 3 g in 0.9% sodium chloride 100 mL IVPB   3 g  IntraVENous  ONCE  Mauser, Bernadene Bell, MD  33.3 mL/hr at 10/23/20 1442  3 g at 10/23/20 1442     ?  [START ON 10/24/2020] lisinopriL (PRINIVIL, ZESTRIL) tablet 20 mg   20 mg  Oral  DAILY  Mauser, Bernadene Bell, MD           ?  ceFAZolin (ANCEF) 1 g in 0.9% sodium chloride (MBP/ADV) 50 mL MBP   1 g  IntraVENous  Q8H  Mauser, Bernadene Bell, MD     IV Completed at 10/23/20 1200     ?  levothyroxine (SYNTHROID) tablet 125 mcg   125  mcg  Oral  Celesta Aver, MD     125 mcg at 10/23/20 6578     ?  furosemide (LASIX) tablet 40 mg   40 mg  Oral  DAILY  Mauser, Bernadene Bell, MD     40 mg at 10/23/20 1007     ?  metoprolol tartrate (LOPRESSOR) tablet 50 mg   50 mg  Oral  BID  [name], MD     50 mg at 10/23/20 1004     ?  albuterol-ipratropium (DUO-NEB) 2.5 MG-0.5 MG/3 ML   3 mL  Nebulization  Q4H PRN  Mauser, Bernadene Bell, MD           ?  furosemide (LASIX) tablet 40 mg   40 mg  Oral  Q MON, WED & Felicie Morn, MD     40 mg at 10/23/20 0956     ?  lactulose (CHRONULAC) 10 gram/15 mL solution 30 mL   20 g  Oral  DAILY  Mauser, Bernadene Bell, MD     30 mL at 10/23/20 0955     ?  magnesium oxide (MAG-OX) tablet 400 mg   400 mg  Oral  DAILY  Mauser, Bernadene Bell, MD     400 mg at 10/23/20 4696     ?  melatonin tablet 3 mg   3 mg  Oral  QHS  Mauser, Bernadene Bell, MD     3 mg at 10/22/20 2140     ?  potassium chloride (K-DUR, KLOR-CON M20) SR tablet 40 mEq   40 mEq  Oral  DAILY  Mauser, Bernadene Bell, MD     40 mEq at 10/23/20 0956     ?  calcium-vitamin D (OS-CAL +D3) 500 mg-200 unit per tablet 1 Tablet   1 Tablet  Oral  DAILY WITH LUNCH  Mauser, Bernadene Bell, MD     1 Tablet at 10/23/20 1200     ?  hydrOXYchloroQUINE (PLAQUENIL) tablet 200 mg   200 mg  Oral  BID  Mauser, Bernadene Bell, MD           ?  Lisbeth Ply by provider] folic acid (FOLVITE) tablet 1 mg   1 mg  Oral  DAILY   Mauser, Bernadene Bell, MD     1 mg at 10/22/20 2952     ?  naloxone (NARCAN) injection 0.1 mg   0.1 mg  IntraVENous  PRN  Mauser, Bernadene Bell, MD           ?  acetaminophen (TYLENOL) tablet 650 mg   650 mg  Oral  Q4H PRN  Mauser, Bernadene Bell, MD                Or              ?  acetaminophen (TYLENOL) solution 650 mg   650 mg  Oral  Q4H PRN  Mauser, Bernadene Bell, MD                Or              ?  acetaminophen (TYLENOL) suppository 650 mg   650 mg  Rectal  Q4H PRN  Mauser, Bernadene Bell, MD                    ?  HYDROcodone-acetaminophen (NORCO) 5-325 mg per tablet 1 Tablet   1 Tablet  Oral  Q6H PRN  Mauser, Bernadene Bell,  MD                    ?  ondansetron (ZOFRAN) injection 4 mg   4 mg  IntraVENous  Q4H PRN  Mauser, Bernadene Bell, MD                    Allergies        Allergen  Reactions         ?  Shellfish Derived  Hives         ?  Sulfa (Sulfonamide Antibiotics)  Hives           Review of Systems:   A 12 point review of systems were obtained with pertinent positives noted in above HPI, otherwise negative         Objective:        Patient Vitals for the past 24 hrs:            Temp  Pulse  Resp  BP  SpO2            10/23/20 1450  98.1 ??F (36.7 ??C)  66  16  (!) 148/92  97 %            10/23/20 0826  97.9 ??F (36.6 ??C)  61  16  (!) 166/95  96 %     10/23/20 0415  98.6 ??F (37 ??C)  77  16  (!) 155/77  96 %     10/22/20 2330  98.1 ??F (36.7 ??C)  67  16  (!) 154/87  93 %            10/22/20 2015  98.6 ??F (37 ??C)  74  16  (!) 174/95  96 %              Intake/Output Summary (Last 24 hours) at 10/23/2020 1731   Last data filed at 10/23/2020 1450     Gross per 24 hour        Intake  387 ml        Output  --        Net  387 ml           Physical Exam:   GENERAL: alert, pleasant, cooperative, no distress, appears stated age   EYE: conjunctivae/corneas clear, No scleral icterus noted    LUNG: clear to auscultation bilaterally   HEART: regular rate and rhythm, S1, S2   ABDOMEN: soft, non-tender, bowel sounds normal. No palpable masses hernias or  organomegaly appreciated    EXTREMITIES:  extremities normal, atraumatic, no cyanosis or edema, Pulses 2+    SKIN: no rash or abnormalities   NEUROLOGIC: AOx3. Gait normal. Reflexes and motor strength normal and symmetric. Cranial nerves 2-12 and sensation grossly intact      Diagnostic Imaging:   No results found.      Laboratory Results:         Labs:  Results:        Chemistry  Recent Labs         10/23/20   0011  10/21/20   1252      GLU  103  85      NA  135*  139      K  3.2*  3.0*      CL  100  102      CO2  25  28      BUN  13  10  CREA  0.66  0.71      CA  8.9  8.6*      AGAP  11  9      AP  88  98      TP  5.6*  6.1      ALB  3.1*  3.5          Estimated Creatinine Clearance: 61.3 mL/min (by C-G formula based on SCr of 0.66 mg/dL).        CBC w/Diff  Recent Labs         10/23/20   0011  10/21/20   1252      WBC  6.0  5.8      RBC  3.48*  3.89      HGB  11.9*  13.3      HCT  35.6*  39.1      PLT  159  151      GRANS  50.1  61.0      LYMPH  32.3  23.1*      EOS  2.5  0.9                 Cardiac Enzymes  No results for input(s): CPK, CKND1, MYO in the last 72 hours.      No lab exists for component: CKRMB, TROIP        Coagulation  Recent Labs         10/21/20   1252      PTP  16.6*      INR  1.4*               Hepatitis Panel  No results found for: HAMAT, HAAB, HABT, HAAT, HBSAG, HBSB, HBSAT, HBABN, HBCM, HBCAB, HBCAT, XBCABS, HBEAB, HBEAG, XHEPCS, [id number], HBEGLT, HBCMLT, HBCLT, HBEBLT,  [id number], [id number], HAVMLT, [id number], HBCMLT, [id number], HCGAT   Recent Labs         10/23/20   0011  10/21/20   1252      ALT  14  15      TBILI  1.70*  1.70*      AP  88  98      ALB  3.1*  3.5      TP  5.6*  6.1                    Amylase Lipase          Liver Enzymes  Recent Labs         10/23/20   0011      TP  5.6*      ALB  3.1*      AP  88      ALT  14                 Thyroid Studies  Recent Labs         10/21/20   1252      TSH  9.565*                   Pathology  pathology              Signed By:  Tawny Hopping  PA-C       October 23, 2020           Office: [phone]            I was personally available for consultation. I have reviewed patient's record and agree with the documentation recorded  by the Physician Asst/Nurse Practitioner, including the assessment, treatment  plan, and disposition.      Raynelle Jan, MD   October 23, 2020

## 2020-10-23 NOTE — Progress Notes (Signed)
Pt transferred from room 6623 to room 6615.

## 2020-10-23 NOTE — Progress Notes (Signed)
Progress Note             Daily Progress Note: 10/23/2020    Assessment/Plan:        Minimal non-productive cough, improved.   Doesn't feel ill, not fatigued.    No chest pain, abdominal pain, N/V.   C/o right leg pain, "feels like a hollow soreness."          Afebrile.   O2 sat 96% on RA.     Few rales left base.     Cellulitis left face, non-tender, new.      Acute Covid-19 infection.   No hypoxia.   PCXR without infiltrate, small left effusion.   Has received 2 vaccines and one booster.   Essentially asymptomatic so far  Multiple falls, 2 within 24 hrs of admission.   PT, OT notes reviewed.   Hx multiple prior orthopedic surgeries and now superimposed encephalopathy, hypothyroidism likely contributing.         - MRI lumbar spine 09/2020: mild to moderate spinal stenosis, moderate foraminal stenosis at multiple levels, and chronic L1 and L2 compression fractures.         - CT C-spine 10/21/20: Multilevel spondylosis.  Metabolic Encephalopathy, hepatic +/- contribution from hypothyroidism and #1.   Pt oriented X 3 this am.     Hepatic encephalopathy.   History of confusion and hallucinations over the last two months.   Improvement in mental status coinciding with improved ammonia levels after starting  Lactulose but worse confusion over last week or so per son.   Lactulose dose increased this admission.         -  [place]:   Prominent periportal echoes, no mass.     GI consult placed.     Pt had liver bx approximately 17 years ago in evaluation of elevated liver enzymes.   5.    Hx hemochromatosis receiving periodic phlebotomies (~ 2/year) via Dr. Keturah [name].    6.   RA, on Methotrexate approximately the last five years, DC'd about a month ago but was still on her pre-admission list of meds.   Continue Plaquenil.   Would have to wait a few weeks for a new DMARD with #1.   (Dr. Kalman [name])  7.   Vitamin D deficieny.   Vitamin D level was top nl and replacement DC'd temporarily sec #4.  Eventually resume lower dose replacement.    8.   Hypothyroidism, inadequate replacement, dose increased this admission.    (11mg => 129m)  9.   Hx subdural hematoma 08/2017, not requiring surgery.  10.   Hypokalemia/hypomagnesemia, replace.  11.   DVT prophylaxis.   Change Heparin to Lovenox SQ.  12.   HTN.   Increase Lisinopril.   Chronically on fairly high doses of Lasix.   Hx leg edema.   Echo 10/18: EF 55%  13.   Cellulitis face.   IV Ancef.   Monitor closely.    Pt may benefit from period of rehab before returning to assisted living.    Discussed with patient's son per phone yesterday.       Subjective:      Review of Systems:  ROS    Objective:   Physical Exam: Physical Exam  Cardiovascular:      Rate and Rhythm: Normal rate and regular rhythm.   Pulmonary:      Effort: Pulmonary effort is normal.      Breath sounds: Normal breath sounds.   Abdominal:      Palpations: Abdomen  is soft.      Tenderness: There is no abdominal tenderness.   Neurological:      Mental Status: She is alert.       Visit Vitals  BP (!) 166/95 (BP 1 Location: Right upper arm, BP Patient Position: Sitting)   Pulse 61   Temp 97.9 ??F (36.6 ??C)   Resp 16   Ht '5\' 6"'  (1.676 m)   Wt 76.2 kg (168 lb)   SpO2 96%   Breastfeeding No   BMI 27.12 kg/m??      O2 Device: None (Room air)    Temp (24hrs), Avg:98.3 ??F (36.8 ??C), Min:97.9 ??F (36.6 ??C), Max:98.6 ??F (37 ??C)    No intake/output data recorded.   08/08 1901 - 08/10 0700  In: 954 [P.O.:954]  Out: -       Data Review:       24 Hour Results:  Recent Results (from the past 24 hour(s))   METABOLIC PANEL, COMPREHENSIVE    Collection Time: 10/23/20 12:11 AM   Result Value Ref Range    Potassium 3.2 (L) 3.5 - 5.1 mEq/L    Chloride 100 98 - 107 mEq/L    Sodium 135 (L) 136 - 145 mEq/L    CO2 25 20 - 31 mEq/L    Glucose 103 74 - 106 mg/dl    BUN 13 9 - 23 mg/dl    Creatinine 0.66 0.55 - 1.02 mg/dl    GFR est AA >60.0      GFR est non-AA >60      Calcium 8.9 8.7 - 10.4 mg/dl    Anion gap 11 5 - 15 mmol/L    AST (SGOT) 44.0 (H) 0.0 - 33.9 U/L     ALT (SGPT) 14 10 - 49 U/L    Alk. phosphatase 88 46 - 116 U/L    Bilirubin, total 1.70 (H) 0.30 - 1.20 mg/dl    Protein, total 5.6 (L) 5.7 - 8.2 gm/dl    Albumin 3.1 (L) 3.4 - 5.0 gm/dl   AMMONIA    Collection Time: 10/23/20 12:11 AM   Result Value Ref Range    Ammonia, plasma 52.0 (H) 11.2 - 31.7 umol/L   MAGNESIUM    Collection Time: 10/23/20 12:11 AM   Result Value Ref Range    Magnesium 1.3 (L) 1.6 - 2.6 mg/dL   CBC WITH AUTOMATED DIFF    Collection Time: 10/23/20 12:11 AM   Result Value Ref Range    WBC 6.0 4.0 - 11.0 1000/mm3    RBC 3.48 (L) 3.60 - 5.20 M/uL    HGB 11.9 (L) 13.0 - 17.2 gm/dl    HCT 35.6 (L) 37.0 - 50.0 %    MCV 102.3 (H) 80.0 - 98.0 fL    MCH 34.2 25.4 - 34.6 pg    MCHC 33.4 30.0 - 36.0 gm/dl    PLATELET 159 140 - 450 1000/mm3    MPV 10.7 (H) 6.0 - 10.0 fL    RDW-SD 52.6 (H) 36.4 - 46.3      NRBC 0 0 - 0      IMMATURE GRANULOCYTES 0.3 0.0 - 3.0 %    NEUTROPHILS 50.1 34 - 64 %    LYMPHOCYTES 32.3 28 - 48 %    MONOCYTES 14.5 (H) 1 - 13 %    EOSINOPHILS 2.5 0 - 5 %    BASOPHILS 0.3 0 - 3 %   PROCALCITONIN    Collection Time: 10/23/20 12:11 AM   Result  Value Ref Range    PROCALCITONIN 0.21 0.00 - 0.50 ng/ml   C REACTIVE PROTEIN, QT    Collection Time: 10/23/20 12:11 AM   Result Value Ref Range    C-Reactive protein 82.00 (H) 0.00 - 9.90 mg/L   LACTIC ACID    Collection Time: 10/23/20 12:11 AM   Result Value Ref Range    Lactic Acid 1.4 0.5 - 2.2 mmol/L   FERRITIN    Collection Time: 10/23/20 12:11 AM   Result Value Ref Range    Ferritin 124.1 7.3 - 270.7 ng/ml   LD    Collection Time: 10/23/20 12:11 AM   Result Value Ref Range    LD 348 (H) 120 - 246 U/L   TRIGLYCERIDE    Collection Time: 10/23/20 12:11 AM   Result Value Ref Range    Triglyceride 74 0 - 150 mg/dl   FIBRINOGEN    Collection Time: 10/23/20 12:11 AM   Result Value Ref Range    Fibrinogen 386 220 - 397 mg/dl   D DIMER    Collection Time: 10/23/20 12:11 AM   Result Value Ref Range    D DIMER 1.59 (H) 0.01 - 0.50 ug/mL (FEU)        Problem List:  Problem List as of 10/23/2020 Never Reviewed            Codes Class Noted - Resolved    Metabolic encephalopathy ATF-57-DU: G93.41  ICD-9-CM: 348.31  10/21/2020 - Present        Contusion of left hip ICD-10-CM: K02.54YH  ICD-9-CM: 924.01  10/21/2020 - Present        Left elbow contusion ICD-10-CM: S50.02XA  ICD-9-CM: 923.11  10/21/2020 - Present        Multiple falls ICD-10-CM: R29.6  ICD-9-CM: V15.88  10/21/2020 - Present        Deep venous thrombosis (HCC) ICD-10-CM: I82.409  ICD-9-CM: 453.40  Unknown - Present        Subdural hematoma caused by concussion [facility]) ICD-10-CM: C62.3J6E  ICD-9-CM: 852.29  08/27/2017 - Present        Subdural hematoma (HCC) ICD-10-CM: G31.5V7O  ICD-9-CM: 432.1  08/27/2017 - Present         Medications reviewed  Current Facility-Administered Medications   Medication Dose Route Frequency    lisinopriL (PRINIVIL, ZESTRIL) tablet 10 mg  10 mg Oral DAILY    levothyroxine (SYNTHROID) tablet 125 mcg  125 mcg Oral 6am    furosemide (LASIX) tablet 40 mg  40 mg Oral DAILY    metoprolol tartrate (LOPRESSOR) tablet 50 mg  50 mg Oral BID    albuterol-ipratropium (DUO-NEB) 2.5 MG-0.5 MG/3 ML  3 mL Nebulization Q4H PRN    furosemide (LASIX) tablet 40 mg  40 mg Oral Q MON, WED & FRI    lactulose (CHRONULAC) 10 gram/15 mL solution 30 mL  20 g Oral DAILY    magnesium oxide (MAG-OX) tablet 400 mg  400 mg Oral DAILY    melatonin tablet 3 mg  3 mg Oral QHS    potassium chloride (K-DUR, KLOR-CON M20) SR tablet 40 mEq  40 mEq Oral DAILY    calcium-vitamin D (OS-CAL +D3) 500 mg-200 unit per tablet 1 Tablet  1 Tablet Oral DAILY WITH LUNCH    [Held by provider] hydrOXYchloroQUINE (PLAQUENIL) tablet 200 mg  200 mg Oral BID    [Held by provider] folic acid (FOLVITE) tablet 1 mg  1 mg Oral DAILY    naloxone (NARCAN) injection 0.1 mg  0.1  mg IntraVENous PRN    acetaminophen (TYLENOL) tablet 650 mg  650 mg Oral Q4H PRN    Or    acetaminophen (TYLENOL) solution 650 mg  650 mg Oral Q4H PRN    Or     acetaminophen (TYLENOL) suppository 650 mg  650 mg Rectal Q4H PRN    HYDROcodone-acetaminophen (NORCO) 5-325 mg per tablet 1 Tablet  1 Tablet Oral Q6H PRN    ondansetron (ZOFRAN) injection 4 mg  4 mg IntraVENous Q4H PRN    heparin (porcine) injection 5,000 Units  5,000 Units SubCUTAneous Q12H       Care Plan discussed with: Patient/Family    Total time spent with patient: 40 minutes.    Colen Darling, MD  October 23, 2020

## 2020-10-24 ENCOUNTER — Inpatient Hospital Stay: Admit: 2020-10-24 | Payer: MEDICARE | Primary: Internal Medicine

## 2020-10-24 LAB — METABOLIC PANEL, BASIC
Anion gap: 11 mmol/L (ref 5–15)
BUN: 8 mg/dl — ABNORMAL LOW (ref 9–23)
CO2: 24 mEq/L (ref 20–31)
Calcium: 9 mg/dl (ref 8.7–10.4)
Chloride: 103 mEq/L (ref 98–107)
Creatinine: 0.62 mg/dl (ref 0.55–1.02)
GFR est AA: >60
GFR est non-AA: >60
Glucose: 125 mg/dl — ABNORMAL HIGH (ref 74–106)
Potassium: 4.2 mEq/L (ref 3.5–5.1)
Sodium: 138 mEq/L (ref 136–145)

## 2020-10-24 LAB — CBC WITH AUTOMATED DIFF
BASOPHILS: 0.1 % (ref 0–3)
EOSINOPHILS: 1.1 % (ref 0–5)
HCT: 39.5 % (ref 37.0–50.0)
HGB: 13.4 gm/dl (ref 13.0–17.2)
IMMATURE GRANULOCYTES: 0.5 % (ref 0.0–3.0)
LYMPHOCYTES: 14.8 % — ABNORMAL LOW (ref 28–48)
MCH: 34.2 pg (ref 25.4–34.6)
MCHC: 33.9 gm/dl (ref 30.0–36.0)
MCV: 100.8 fL — ABNORMAL HIGH (ref 80.0–98.0)
MONOCYTES: 8.1 % (ref 1–13)
MPV: 10.7 fL — ABNORMAL HIGH (ref 6.0–10.0)
NEUTROPHILS: 75.4 % — ABNORMAL HIGH (ref 34–64)
NRBC: 0 (ref 0–0)
PLATELET: 177 10*3/uL (ref 140–450)
RBC: 3.92 M/uL (ref 3.60–5.20)
RDW-SD: 51.2 — ABNORMAL HIGH (ref 36.4–46.3)
WBC: 8.5 10*3/uL (ref 4.0–11.0)

## 2020-10-24 LAB — AMMONIA
Ammonia, plasma: 53 umol/L — ABNORMAL HIGH (ref 11.2–31.7)
Ammonia: 53 umol/L — ABNORMAL HIGH (ref 11.2–31.7)

## 2020-10-24 LAB — MAGNESIUM
Magnesium: 1.6 mg/dL (ref 1.6–2.6)
Magnesium: 1.6 mg/dL (ref 1.6–2.6)

## 2020-10-24 LAB — C REACTIVE PROTEIN, QT: C-Reactive protein: >91.2 mg/L — ABNORMAL HIGH (ref 0.00–9.90)

## 2020-10-24 LAB — BASIC METABOLIC PANEL
Anion Gap: 11 mmol/L (ref 5–15)
BUN: 8 mg/dl — ABNORMAL LOW (ref 9–23)
CO2: 24 mEq/L (ref 20–31)
Calcium: 9 mg/dl (ref 8.7–10.4)
Chloride: 103 mEq/L (ref 98–107)
Creatinine: 0.62 mg/dl (ref 0.55–1.02)
EGFR IF NonAfrican American: >60
GFR African American: >60
Glucose: 125 mg/dl — ABNORMAL HIGH (ref 74–106)
Potassium: 4.2 mEq/L (ref 3.5–5.1)
Sodium: 138 mEq/L (ref 136–145)

## 2020-10-24 LAB — CBC WITH AUTO DIFFERENTIAL
Basophils %: 0.1 % (ref 0–3)
Eosinophils %: 1.1 % (ref 0–5)
Hematocrit: 39.5 % (ref 37.0–50.0)
Hemoglobin: 13.4 gm/dl (ref 13.0–17.2)
Immature Granulocytes: 0.5 % (ref 0.0–3.0)
Lymphocytes %: 14.8 % — ABNORMAL LOW (ref 28–48)
MCH: 34.2 pg (ref 25.4–34.6)
MCHC: 33.9 gm/dl (ref 30.0–36.0)
MCV: 100.8 fL — ABNORMAL HIGH (ref 80.0–98.0)
MPV: 10.7 fL — ABNORMAL HIGH (ref 6.0–10.0)
Monocytes %: 8.1 % (ref 1–13)
Neutrophils %: 75.4 % — ABNORMAL HIGH (ref 34–64)
Nucleated RBCs: 0 (ref 0–0)
Platelets: 177 10*3/uL (ref 140–450)
RBC: 3.92 M/uL (ref 3.60–5.20)
RDW-SD: 51.2 — ABNORMAL HIGH (ref 36.4–46.3)
WBC: 8.5 10*3/uL (ref 4.0–11.0)

## 2020-10-24 LAB — C-REACTIVE PROTEIN: CRP: >91.2 mg/L — ABNORMAL HIGH (ref 0.00–9.90)

## 2020-10-24 MED ORDER — LISINOPRIL 20 MG TAB
20 mg | Freq: Once | ORAL | Status: AC
Start: 2020-10-24 — End: 2020-10-24
  Administered 2020-10-24: 15:00:00 via ORAL

## 2020-10-24 MED ORDER — RIFAXIMIN 550 MG TAB
550 mg | Freq: Two times a day (BID) | ORAL | Status: DC
Start: 2020-10-24 — End: 2020-11-13
  Administered 2020-10-24 – 2020-11-13 (×39): via ORAL

## 2020-10-24 MED ORDER — LISINOPRIL 20 MG TAB
20 mg | Freq: Every day | ORAL | Status: DC
Start: 2020-10-24 — End: 2020-11-13
  Administered 2020-10-25 – 2020-10-27 (×3): via ORAL

## 2020-10-24 MED FILL — MELATONIN 3 MG TAB: 3 mg | ORAL | Qty: 1

## 2020-10-24 MED FILL — MAGNESIUM OXIDE 400 MG TAB: 400 mg | ORAL | Qty: 1

## 2020-10-24 MED FILL — CEFAZOLIN 1 GRAM SOLUTION FOR INJECTION: 1 gram | INTRAMUSCULAR | Qty: 1000

## 2020-10-24 MED FILL — METOPROLOL TARTRATE 50 MG TAB: 50 mg | ORAL | Qty: 1

## 2020-10-24 MED FILL — XIFAXAN 550 MG TABLET: 550 mg | ORAL | Qty: 1

## 2020-10-24 MED FILL — ENOXAPARIN 40 MG/0.4 ML SUB-Q SYRINGE: 40 mg/0.4 mL | SUBCUTANEOUS | Qty: 0.4

## 2020-10-24 MED FILL — FUROSEMIDE 40 MG TAB: 40 mg | ORAL | Qty: 1

## 2020-10-24 MED FILL — LISINOPRIL 20 MG TAB: 20 mg | ORAL | Qty: 1

## 2020-10-24 MED FILL — LACTULOSE 20 GRAM/30 ML ORAL SOLUTION: 20 gram/30 mL | ORAL | Qty: 30

## 2020-10-24 MED FILL — POTASSIUM CHLORIDE SR 20 MEQ TAB, PARTICLES/CRYSTALS: 20 mEq | ORAL | Qty: 2

## 2020-10-24 MED FILL — HYDROXYCHLOROQUINE 200 MG TAB: 200 mg | ORAL | Qty: 1

## 2020-10-24 MED FILL — OYSTER SHELL CALCIUM-VITAMIN D3 500 MG-5 MCG (200 UNIT) TABLET: 500 mg-5 mcg (200 unit) | ORAL | Qty: 1

## 2020-10-24 MED FILL — LEVOTHYROXINE 125 MCG TAB: 125 mcg | ORAL | Qty: 1

## 2020-10-24 NOTE — Progress Notes (Signed)
Progress Note             Daily Progress Note: 10/24/2020    Assessment/Plan:        GI consult reviewed/appreciated.     Pt has become more confused again over the last 24 hours, hallucinating.   (Thinks there is another lady and a baby in her room.)   Minimal non-productive cough.   No chest pain, abdominal pain, dysuria.         Afebrile.   O2 sat 96% on RA.     Few rales left base.   Mild wheezing, (new).     Cellulitis left face, improved.   Also with rosacea.      Acute Covid-19 infection.   No hypoxia.   PCXR 8/9 without infiltrate, small left effusion.   Has received 2 vaccines and one booster.   Repeat PCXR.  Multiple falls, 2 within 24 hrs of admission.   PT, OT notes reviewed.   Hx multiple prior orthopedic surgeries and now superimposed encephalopathy, hypothyroidism likely contributing.         - MRI lumbar spine 09/2020: mild to moderate spinal stenosis, moderate foraminal stenosis at multiple levels, and chronic L1 and L2 compression fractures.         - CT C-spine 10/21/20: Multilevel spondylosis.  Metabolic Encephalopathy, hepatic +/- contribution from hypothyroidism and #1.   Pt oriented X 3 this am.     Hepatic encephalopathy.   History of confusion and hallucinations over the last two months.   Improvement in mental status coinciding with improved ammonia levels after starting  Lactulose but worse confusion over last week or so per son.   Lactulose dose increased this admission.    Add Xifaxan.     -  [place]:   Prominent periportal echoes, no mass.     GI consult placed.     Pt had liver bx approximately 17 years ago in evaluation of elevated liver enzymes.   5.    Hx hemochromatosis receiving periodic phlebotomies (~ 2/year) via Dr. Arnette Felts.    6.   RA, on Methotrexate approximately the last five years, DC'd about a month ago but was still on her pre-admission list of meds.   Continue Plaquenil.   Would have to wait a few weeks for a new DMARD with #1.   (Dr. Salem Caster)  7.   Vitamin D deficieny.    Vitamin D level was top nl and replacement DC'd temporarily sec #4.  Eventually resume lower dose replacement.   8.   Hypothyroidism, inadequate replacement, dose increased this admission.    ( => )  9.   Hx subdural hematoma 08/2017, not requiring surgery.  10.   Hypokalemia/hypomagnesemia, replace.  11.   DVT prophylaxis.   Change Heparin to Lovenox SQ.  12.   HTN.   Increase Lisinopril further to 40 mg q d.   Chronically on fairly high doses of Lasix.   Hx leg edema.   Echo 10/18: EF 55%.   DC the M/W/F afternoon doses of Lasix.  13.   Cellulitis face.   Continue IV Ancef.    14.   Rosacea.    Pt may benefit from period of rehab before returning to assisted living.    Discussed with patient's son per phone.       Subjective:      Review of Systems:  ROS    Objective:   Physical Exam: Physical Exam  Cardiovascular:      Rate  and Rhythm: Normal rate and regular rhythm.   Pulmonary:      Effort: Pulmonary effort is normal.      Breath sounds: Normal breath sounds.   Abdominal:      Palpations: Abdomen is soft.      Tenderness: There is no abdominal tenderness.   Neurological:      Mental Status: She is alert.       Visit Vitals  BP (!) 171/93 (BP 1 Location: Right arm, BP Patient Position: Supine)   Pulse 69   Temp (!) 91.9 ??F (33.3 ??C)   Resp 17   Ht 5\' 6"  (1.676 m)   Wt 76.2 kg (168 lb)   SpO2 96%   Breastfeeding No   BMI 27.12 kg/m??      O2 Device: None (Room air)    Temp (24hrs), Avg:97 ??F (36.1 ??C), Min:91.9 ??F (33.3 ??C), Max:98.8 ??F (37.1 ??C)    No intake/output data recorded.   08/09 1901 - 08/11 0700  In: 387 [P.O.:237; I.V.:150]  Out: -       Data Review:       24 Hour Results:  No results found for this or any previous visit (from the past 24 hour(s)).      Problem List:  Problem List as of 10/24/2020 Never Reviewed            Codes Class Noted - Resolved    Metabolic encephalopathy ICD-10-CM: G93.41  ICD-9-CM: 348.31  10/21/2020 - Present        Contusion of left hip ICD-10-CM: S70.02XA  ICD-9-CM:  924.01  10/21/2020 - Present        Left elbow contusion ICD-10-CM: S50.02XA  ICD-9-CM: 923.11  10/21/2020 - Present        Multiple falls ICD-10-CM: R29.6  ICD-9-CM: V15.88  10/21/2020 - Present        Deep venous thrombosis (HCC) ICD-10-CM: I82.409  ICD-9-CM: 453.40  Unknown - Present        Subdural hematoma caused by concussion [facility]) ICD-10-CM: [facility]  ICD-9-CM: 852.29  08/27/2017 - Present        Subdural hematoma (HCC) ICD-10-CM: 08/29/2017  ICD-9-CM: 432.1  08/27/2017 - Present       Medications reviewed  Current Facility-Administered Medications   Medication Dose Route Frequency    lisinopriL (PRINIVIL, ZESTRIL) tablet 20 mg  20 mg Oral ONCE    [START ON 10/25/2020] lisinopriL (PRINIVIL, ZESTRIL) tablet 40 mg  40 mg Oral DAILY    rifAXIMin (XIFAXAN) tablet 550 mg  550 mg Oral BID    enoxaparin (LOVENOX) injection 40 mg  40 mg SubCUTAneous Q24H    ceFAZolin (ANCEF) 1 g in 0.9% sodium chloride (MBP/ADV) 50 mL MBP  1 g IntraVENous Q8H    levothyroxine (SYNTHROID) tablet 125 mcg  125 mcg Oral 6am    furosemide (LASIX) tablet 40 mg  40 mg Oral DAILY    metoprolol tartrate (LOPRESSOR) tablet 50 mg  50 mg Oral BID    albuterol-ipratropium (DUO-NEB) 2.5 MG-0.5 MG/3 ML  3 mL Nebulization Q4H PRN    furosemide (LASIX) tablet 40 mg  40 mg Oral Q MON, WED & FRI    lactulose (CHRONULAC) 10 gram/15 mL solution 30 mL  20 g Oral DAILY    magnesium oxide (MAG-OX) tablet 400 mg  400 mg Oral DAILY    melatonin tablet 3 mg  3 mg Oral QHS    potassium chloride (K-DUR, KLOR-CON M20) SR tablet 40 mEq  40 mEq Oral  DAILY    calcium-vitamin D (OS-CAL +D3) 500 mg-200 unit per tablet 1 Tablet  1 Tablet Oral DAILY WITH LUNCH    hydrOXYchloroQUINE (PLAQUENIL) tablet 200 mg  200 mg Oral BID    [Held by provider] folic acid (FOLVITE) tablet 1 mg  1 mg Oral DAILY    naloxone (NARCAN) injection 0.1 mg  0.1 mg IntraVENous PRN    acetaminophen (TYLENOL) tablet 650 mg  650 mg Oral Q4H PRN    Or    acetaminophen (TYLENOL) solution 650 mg  650 mg Oral Q4H  PRN    Or    acetaminophen (TYLENOL) suppository 650 mg  650 mg Rectal Q4H PRN    HYDROcodone-acetaminophen (NORCO) 5-325 mg per tablet 1 Tablet  1 Tablet Oral Q6H PRN    ondansetron (ZOFRAN) injection 4 mg  4 mg IntraVENous Q4H PRN       Care Plan discussed with: Patient/Family    Total time spent with patient: 40 minutes.    [name], MD  October 24, 2020

## 2020-10-25 ENCOUNTER — Inpatient Hospital Stay: Admit: 2020-10-25 | Payer: MEDICARE | Primary: Internal Medicine

## 2020-10-25 LAB — METABOLIC PANEL, COMPREHENSIVE
ALT (SGPT): 12 U/L (ref 10–49)
AST (SGOT): 35 U/L — ABNORMAL HIGH (ref 0.0–33.9)
Albumin: 3.2 gm/dl — ABNORMAL LOW (ref 3.4–5.0)
Alk. phosphatase: 87 U/L (ref 46–116)
Anion gap: 9 mmol/L (ref 5–15)
BUN: 8 mg/dl — ABNORMAL LOW (ref 9–23)
Bilirubin, total: 2.3 mg/dl — ABNORMAL HIGH (ref 0.30–1.20)
CO2: 28 mEq/L (ref 20–31)
Calcium: 8.9 mg/dl (ref 8.7–10.4)
Chloride: 102 mEq/L (ref 98–107)
Creatinine: 0.52 mg/dl — ABNORMAL LOW (ref 0.55–1.02)
GFR est AA: >60
GFR est non-AA: >60
Glucose: 109 mg/dl — ABNORMAL HIGH (ref 74–106)
Potassium: 3.4 mEq/L — ABNORMAL LOW (ref 3.5–5.1)
Protein, total: 6.1 gm/dl (ref 5.7–8.2)
Sodium: 139 mEq/L (ref 136–145)

## 2020-10-25 LAB — CBC WITH AUTOMATED DIFF
BASOPHILS: 0.3 % (ref 0–3)
EOSINOPHILS: 1.8 % (ref 0–5)
HCT: 39.6 % (ref 37.0–50.0)
HGB: 13.2 gm/dl (ref 13.0–17.2)
IMMATURE GRANULOCYTES: 0.3 % (ref 0.0–3.0)
LYMPHOCYTES: 18.1 % — ABNORMAL LOW (ref 28–48)
MCH: 34.2 pg (ref 25.4–34.6)
MCHC: 33.3 gm/dl (ref 30.0–36.0)
MCV: 102.6 fL — ABNORMAL HIGH (ref 80.0–98.0)
MONOCYTES: 11.1 % (ref 1–13)
MPV: 11.3 fL — ABNORMAL HIGH (ref 6.0–10.0)
NEUTROPHILS: 68.4 % — ABNORMAL HIGH (ref 34–64)
NRBC: 0 (ref 0–0)
PLATELET: 183 10*3/uL (ref 140–450)
RBC: 3.86 M/uL (ref 3.60–5.20)
RDW-SD: 52.6 — ABNORMAL HIGH (ref 36.4–46.3)
WBC: 8.9 10*3/uL (ref 4.0–11.0)

## 2020-10-25 LAB — C REACTIVE PROTEIN, QT: C-Reactive protein: >91.2 mg/L — ABNORMAL HIGH (ref 0.00–9.90)

## 2020-10-25 LAB — AMMONIA
Ammonia, plasma: 50 umol/L — ABNORMAL HIGH (ref 11.2–31.7)
Ammonia: 50 umol/L — ABNORMAL HIGH (ref 11.2–31.7)

## 2020-10-25 LAB — MAGNESIUM
Magnesium: 1.6 mg/dL (ref 1.6–2.6)
Magnesium: 1.6 mg/dL (ref 1.6–2.6)

## 2020-10-25 LAB — C-REACTIVE PROTEIN: CRP: >91.2 mg/L — ABNORMAL HIGH (ref 0.00–9.90)

## 2020-10-25 LAB — CBC WITH AUTO DIFFERENTIAL
Basophils %: 0.3 % (ref 0–3)
Eosinophils %: 1.8 % (ref 0–5)
Hematocrit: 39.6 % (ref 37.0–50.0)
Hemoglobin: 13.2 gm/dl (ref 13.0–17.2)
Immature Granulocytes: 0.3 % (ref 0.0–3.0)
Lymphocytes %: 18.1 % — ABNORMAL LOW (ref 28–48)
MCH: 34.2 pg (ref 25.4–34.6)
MCHC: 33.3 gm/dl (ref 30.0–36.0)
MCV: 102.6 fL — ABNORMAL HIGH (ref 80.0–98.0)
MPV: 11.3 fL — ABNORMAL HIGH (ref 6.0–10.0)
Monocytes %: 11.1 % (ref 1–13)
Neutrophils %: 68.4 % — ABNORMAL HIGH (ref 34–64)
Nucleated RBCs: 0 (ref 0–0)
Platelets: 183 10*3/uL (ref 140–450)
RBC: 3.86 M/uL (ref 3.60–5.20)
RDW-SD: 52.6 — ABNORMAL HIGH (ref 36.4–46.3)
WBC: 8.9 10*3/uL (ref 4.0–11.0)

## 2020-10-25 LAB — COMPREHENSIVE METABOLIC PANEL
ALT: 12 U/L (ref 10–49)
AST: 35 U/L — ABNORMAL HIGH (ref 0.0–33.9)
Albumin: 3.2 gm/dl — ABNORMAL LOW (ref 3.4–5.0)
Alkaline Phosphatase: 87 U/L (ref 46–116)
Anion Gap: 9 mmol/L (ref 5–15)
BUN: 8 mg/dl — ABNORMAL LOW (ref 9–23)
CO2: 28 mEq/L (ref 20–31)
Calcium: 8.9 mg/dl (ref 8.7–10.4)
Chloride: 102 mEq/L (ref 98–107)
Creatinine: 0.52 mg/dl — ABNORMAL LOW (ref 0.55–1.02)
EGFR IF NonAfrican American: >60
GFR African American: >60
Glucose: 109 mg/dl — ABNORMAL HIGH (ref 74–106)
Potassium: 3.4 mEq/L — ABNORMAL LOW (ref 3.5–5.1)
Sodium: 139 mEq/L (ref 136–145)
Total Bilirubin: 2.3 mg/dl — ABNORMAL HIGH (ref 0.30–1.20)
Total Protein: 6.1 gm/dl (ref 5.7–8.2)

## 2020-10-25 MED ORDER — LACTULOSE 10 GRAM/15 ML ORAL SOLUTION
10 gram/15 mL | Freq: Two times a day (BID) | ORAL | Status: DC
Start: 2020-10-25 — End: 2020-11-02
  Administered 2020-10-26 – 2020-11-02 (×14): via ORAL

## 2020-10-25 MED ORDER — METOPROLOL TARTRATE 50 MG TAB
50 mg | Freq: Two times a day (BID) | ORAL | Status: DC
Start: 2020-10-25 — End: 2020-11-01
  Administered 2020-10-26 – 2020-10-28 (×5): via ORAL

## 2020-10-25 MED ORDER — MAGNESIUM SULFATE 2 GRAM/50 ML IVPB
2 gram/50 mL (4 %) | Freq: Once | INTRAVENOUS | Status: AC
Start: 2020-10-25 — End: 2020-10-25
  Administered 2020-10-25: 16:00:00 via INTRAVENOUS

## 2020-10-25 MED ORDER — CEPHALEXIN 500 MG CAP
500 mg | Freq: Four times a day (QID) | ORAL | Status: AC
Start: 2020-10-25 — End: 2020-10-28
  Administered 2020-10-25 – 2020-10-27 (×9): via ORAL

## 2020-10-25 MED ORDER — POTASSIUM CHLORIDE SR 20 MEQ TAB, PARTICLES/CRYSTALS
20 mEq | Freq: Once | ORAL | Status: AC
Start: 2020-10-25 — End: 2020-10-25
  Administered 2020-10-25: 16:00:00 via ORAL

## 2020-10-25 MED FILL — XIFAXAN 550 MG TABLET: 550 mg | ORAL | Qty: 1

## 2020-10-25 MED FILL — CEPHALEXIN 250 MG CAP: 250 mg | ORAL | Qty: 2

## 2020-10-25 MED FILL — HYDROXYCHLOROQUINE 200 MG TAB: 200 mg | ORAL | Qty: 1

## 2020-10-25 MED FILL — LEVOTHYROXINE 125 MCG TAB: 125 mcg | ORAL | Qty: 1

## 2020-10-25 MED FILL — POTASSIUM CHLORIDE SR 20 MEQ TAB, PARTICLES/CRYSTALS: 20 mEq | ORAL | Qty: 2

## 2020-10-25 MED FILL — MAGNESIUM OXIDE 400 MG TAB: 400 mg | ORAL | Qty: 1

## 2020-10-25 MED FILL — MELATONIN 3 MG TAB: 3 mg | ORAL | Qty: 1

## 2020-10-25 MED FILL — OYSTER SHELL CALCIUM-VITAMIN D3 500 MG-5 MCG (200 UNIT) TABLET: 500 mg-5 mcg (200 unit) | ORAL | Qty: 1

## 2020-10-25 MED FILL — LISINOPRIL 20 MG TAB: 20 mg | ORAL | Qty: 2

## 2020-10-25 MED FILL — MAGNESIUM SULFATE 2 GRAM/50 ML IVPB: 2 gram/50 mL (4 %) | INTRAVENOUS | Qty: 50

## 2020-10-25 MED FILL — METOPROLOL TARTRATE 50 MG TAB: 50 mg | ORAL | Qty: 1

## 2020-10-25 MED FILL — ENOXAPARIN 40 MG/0.4 ML SUB-Q SYRINGE: 40 mg/0.4 mL | SUBCUTANEOUS | Qty: 0.4

## 2020-10-25 MED FILL — CEFAZOLIN 1 GRAM SOLUTION FOR INJECTION: 1 gram | INTRAMUSCULAR | Qty: 1000

## 2020-10-25 MED FILL — FUROSEMIDE 40 MG TAB: 40 mg | ORAL | Qty: 1

## 2020-10-25 NOTE — Progress Notes (Signed)
 PHYSICAL THERAPY TREATMENT    Patient: [name] [name] (87 y.o. female)  Room: 6616/6616    Date: 10/25/2020  Start Time:  1326  End Time:  1352    Primary Diagnosis: Metabolic encephalopathy [G93.41]  Multiple falls [R29.6]  Contusion of left hip [S70.02XA]  Left elbow contusion [S50.02XA]         Precautions: falls, COVID (+)      Isolation:  Droplet Plus       MDRO: COVID-19     Orders reviewed, chart reviewed, and initial evaluation completed on [name] [name]. Cleared by RN.     ASSESSMENT :  Based on the objective data described below, the patient presents with      - eyes closed 50% of the session, ranged from non-verbal to tangential, nonsensical responses. Occasional appropriate response  - repeated therapist initially, claiming the same identity. Half-way through session was able to provide her name as [name] [name]  - compliant w PROM therex, able to progress to AROM  - limited dt B knee pain, R>L  - limited dt cognitive deficits  - difficulty w following commands and staying on task, frequent redirection  - incr time for communication, delayed responses  - attempted sup>sit. Pt agreeable (It's 1pm and I haven't gotten out of bed?) but did not initiate movement, nor respond to verbal/tactile cues and instruction. Pt verbally agreeing to sit EOB, unable to initiate movement. Impaired sequencing, difficulty motor planning   - pt set up for comfort, as found and Nurse notified     Progression toward goals:  []           Improving appropriately and progressing toward goals  []           Improving slowly and progressing toward goals  [x]           Not making progress toward goals    Patient will benefit from skilled intervention to address the above impairments.  Patient's rehabilitation potential is considered to be Fair        PLAN :  Planned Interventions:  Functional mobility training Gait Training Therapeutic exercises Therapeutic activities AD training Patient/caregiver education    Frequency/Duration:  Patient will be followed by physical therapy 3x / Week and 5x / Week to address goals.    Recommendations:  Physical Therapy and Occupational Therapy  Discharge Recommendations: TBD, pending progress  Further Equipment Recommendations for Discharge: TBD        SUBJECTIVE:   Patient repeating therapist I'm [name] [name] from Physical Therapy    OBJECTIVE DATA SUMMARY:     Patient found: Bed, semirec, purewick, Cp present, bed alarm    Pain Assessment before PT session: 0/10  Pain Assessment after PT session: a lot  Pain Location:  R>L knee w mobility      COGNITIVE STATUS:     Mental Status: Oriented to year, birth date. [name] [name], for name, [phone] for month. During session, therapist asked What is your name? Pt responded  [name] [name] , saying that this was her address.  Communication: eyes closed, 50% nonverbal and appearing asleep, intermittent communication.  Follows commands: impaired.  General Cognition: impaired sequencing, slow processing, delayed responses, and impaired safety.  Hearing: grossly intact.  Vision:  glasses.    Functional Mobility and Balance Status:     Unable to test any func mob/balance  Attempted sup>sit    Therapeutic Exercises:      Lower Extremities:  Supine, Quad Set, Heel Slide, Hip ab/duction, and Ankle pumps, glute  set, 10reps, 2 sets    Upper Extremities:  3 high fives BUE    Activity Tolerance:   poor    Final Location:   bed, bed alarm, all needs close, agrees to call for assistance, and nurse notified     COMMUNICATION/EDUCATION:   Education: Patient, benefit of activity while hospitalized, importance of OOB  Barriers to Learning/Limitations: yes;  cognitive    Please refer to care plan and patient education section for further details.    Thank you for this referral.  Dorn CHRISTELLA Rack, PTA

## 2020-10-25 NOTE — Progress Notes (Signed)
Patient admitted on 10/21/2020 from [facility] with   Chief Complaint   Patient presents with    Fall    Hip Pain          The patient is being treated for    PMH:   Past Medical History:   Diagnosis Date    Arthritis     Deep venous thrombosis (HCC)     Endocrine disease     hypothyroid    Hemochromatosis     Hypertension         Treatment Team: Treatment Team: Attending Provider: [name], MD; Consulting Provider: [name], MD; Care Manager: Jenell Milliner; Physical Therapy Assistant: Margrett Rud, PTA; Primary Nurse: Karsten Fells, RN      The patient has been admitted to the hospital 0 times in the past 12 months.    Previous 4 Admission Dates Admission and Discharge Diagnosis Interventions Barriers Disposition                                 Patient and Family/Caregivers Goals of Care: get better and return home    Caregivers Participating in Plan of Care/Discharge Plan with the patient: son, Cristal Deer    Tentative dc plan:  SNF vs ALF with [facility]    Anticipated DME needs for discharge: TBD    PRESCREENING COMPLETED FOR SNF yes  Will patient qualify for medicaid now or in the next 180 days?   no      Has patient had covid vaccine? Yes  Date and type if not in chart under immunization field   Moderna vaccine and one booster, possibly two    Does patient have an ACP?  no         Does the patient have appropriate clothing available to be worn at discharge?   yes      The patient and care participants are willing to travel 25 mile area for discharge facility.  The patient and plan of care participants have been provided with a list of all available Rehab Facilities or Home Health agencies as applicable. CM will follow up with a list of facilities or agencies that are offer acceptance.    CM has disclosed any financial interest that [facility] may have with any facility or agency.    Anticipated Discharge Date: 10/29/20      Barriers to Healthcare Success/ Readmission Risk Factors:  advanced age, medical history    Consults:  Palliative Care Consult Recommended: no  Transitional Care Clinic Referral: no  Transitional Nurse Navigator Referral: no  Oncology Navigator Referral: no  SW consulted: no  [facility] Consulted: no  Outside [employer] Referrals and Collaboration: no    Food/Nutrition Needs:   no                   Dietician Consulted: no    RRAT Score: Low Risk            10 Total Score    3 Has Seen PCP in Last 6 Months (Yes=3, No=0)    2 Married. Living with Significant Other. Assisted Living. LTAC. SNF. or   Rehab    5 Pt. Coverage (Medicare=5 , Medicaid, or Self-Pay=4)        Criteria that do not apply:    Patient Length of Stay (>5 days = 3)    IP Visits Last 12  Months (1-3=4, 4=9, >4=11)    Charlson Comorbidity Score (Age + Comorbid Conditions)           PCP: [name], MD . How do you get to your doctor appointment  family drives    Specialists:     Dialysis Unit: n/a    Pharmacy:   name through ALF   Location of pharmacy   address  Are there any medications that you have trouble paying for  no  Any difficulty getting your medication  no    DME available at Home:walker    Home O2 L Flow:              Home O2 Provider:     Home Environment and Prior Level of Function: Lives at [address]  [address]  [place] [address] @HOMEPHONE @. Lives at at [facility]   How many stories is home   multilevel   Steps into home  ramp  Responsibilities at home include need assistance with some adls and all iadls    Prior to admission open services:   none    Home Health Agency   no  Personal Care Agency  no    Extended Emergency Contact Information  Primary Emergency Contact: Rotenberg,Christopher J  Address: [address]           [place], [place]  Home Phone: [phone]  Mobile Phone: [phone]  Relation: Son     Transportation:  will need transport home    Therapy  Recommendations:    OT = yes    PT = yes    SLP =  yes     RT Home O2 Evaluation =  no    Wound Care =  no    Case Management Assessment    ABUSE/NEGLECT SCREENING   Physical Abuse/Neglect: Denies   Sexual Abuse: Denies   Sexual Abuse: Denies   Other Abuse/Issues: Denies          PRIMARY DECISION MAKER                                   CARE MANAGEMENT INTERVENTIONS   Readmission Interview Completed: Not Applicable   PCP Verified by CM: Yes           Mode of Transport at Discharge: Self       Transition of Care Consult (CM Consult): SNF                                   Reason for Referral: DCP Rounds   History Provided By: Child/Family           Support System Response: Concerned, Cooperative   Previous Living Arrangement: Assisted Living Facility   Home Accessibility: Ramp   Prior Functional Level: Assistance with the following:, Bathing   Current Functional Level: Assistance with the following:, Bathing, Dressing, Housework, Cooking, Mobility, Shopping, Toileting       Can patient return to prior living arrangement: Yes   Ability to make needs known:: Fair   Family able to assist with home care needs:: No                   Anticipated Discharge Needs: Skilled Nursing Facility   Confirm Follow Up Transport: Family  DISCHARGE LOCATION

## 2020-10-25 NOTE — Progress Notes (Signed)
Progress Note             Daily Progress Note: 10/25/2020    Assessment/Plan:        GI consult reviewed/appreciated.     Pt more somnolent this am.    Follows commands appropriately.   Motor +5 all extremities.     Minimal non-productive cough.   No chest pain, abdominal pain, dysuria.         Afebrile.   O2 sat 96% on RA.     Few rales left base.       Cellulitis left face, resolving.   Also with rosacea.       Ammonia 50.   CRP 91.2 (has RA, background to Covid-19)      Acute Covid-19 infection.   No hypoxia.   PCXR 8/9 without infiltrate, small left effusion.   Has received 2 vaccines and one booster.     Multiple falls, 2 within 24 hrs of admission.   PT, OT notes reviewed.   Hx multiple prior orthopedic surgeries and now superimposed encephalopathy, hypothyroidism likely contributing.         - MRI lumbar spine 09/2020: mild to moderate spinal stenosis, moderate foraminal stenosis at multiple levels, and chronic L1 and L2 compression fractures.         - CT C-spine 10/21/20: Multilevel spondylosis.  Metabolic Encephalopathy, hepatic +/- contribution from hypothyroidism and #1.       Hepatic encephalopathy.   History of confusion and hallucinations over the last two months.   Improvement in mental status coinciding with improved ammonia levels after starting  Lactulose but worse confusion over last week or so per son.   Increase Lactulose.    Xifaxan added yesterday.     -  [place]:   Prominent periportal echoes, no mass.     GI consult placed.     Pt had liver bx approximately 17 years ago in evaluation of elevated liver enzymes.   5.    Hx hemochromatosis receiving periodic phlebotomies (~ 2/year) via Dr. Keturah Barre.    6.   RA, on Methotrexate approximately the last five years, DC'd about a month ago but was still on her pre-admission list of meds.   Continue Plaquenil.   Would have to wait a few weeks for a new DMARD with #1.   (Dr. Kalman Jewels)  7.   Vitamin D deficieny.   Vitamin D level was top nl and replacement DC'd  temporarily sec #4.  Eventually resume lower dose replacement.   8.   Hypothyroidism, inadequate replacement, dose increased this admission.    (139mg => 126m)  9.   Hx subdural hematoma 08/2017, not requiring surgery.  10.   Hypokalemia/hypomagnesemia, replace.  11.   DVT prophylaxis.   Change Heparin to Lovenox SQ.  12.   HTN.   Increase Metoprolol 75 mg BID.   EKG.  Lisinopril increased from 1023mo 40 mg this admission.     13.   Cellulitis face.   Resolving   Change po Keflex.    14.   Rosacea.    - increase Lactulose.     -  CT head  - replace K+, Mg++.    Pt may benefit from period of rehab before returning to assisted living.         Subjective:      Review of Systems:  ROS    Objective:   Physical Exam: Physical Exam  Cardiovascular:      Rate and Rhythm:  Normal rate and regular rhythm.   Pulmonary:      Effort: Pulmonary effort is normal.      Breath sounds: Normal breath sounds.   Abdominal:      Palpations: Abdomen is soft.      Tenderness: There is no abdominal tenderness.   Neurological:      Mental Status: She is alert.       Visit Vitals  BP (!) 154/84 (BP 1 Location: Right arm, BP Patient Position: Supine)   Pulse 67   Temp 97.9 ??F (36.6 ??C)   Resp 20   Ht 5' 6" (1.676 m)   Wt 76.2 kg (168 lb)   SpO2 99%   Breastfeeding No   BMI 27.12 kg/m??      O2 Device: None (Room air)    Temp (24hrs), Avg:98.5 ??F (36.9 ??C), Min:97.9 ??F (36.6 ??C), Max:99 ??F (37.2 ??C)    No intake/output data recorded.   No intake/output data recorded.      Data Review:       24 Hour Results:  Recent Results (from the past 24 hour(s))   CBC WITH AUTOMATED DIFF    Collection Time: 10/24/20  3:12 PM   Result Value Ref Range    WBC 8.5 4.0 - 11.0 1000/mm3    RBC 3.92 3.60 - 5.20 M/uL    HGB 13.4 13.0 - 17.2 gm/dl    HCT 39.5 37.0 - 50.0 %    MCV 100.8 (H) 80.0 - 98.0 fL    MCH 34.2 25.4 - 34.6 pg    MCHC 33.9 30.0 - 36.0 gm/dl    PLATELET 177 140 - 450 1000/mm3    MPV 10.7 (H) 6.0 - 10.0 fL    RDW-SD 51.2 (H) 36.4 - 46.3      NRBC  0 0 - 0      IMMATURE GRANULOCYTES 0.5 0.0 - 3.0 %    NEUTROPHILS 75.4 (H) 34 - 64 %    LYMPHOCYTES 14.8 (L) 28 - 48 %    MONOCYTES 8.1 1 - 13 %    EOSINOPHILS 1.1 0 - 5 %    BASOPHILS 0.1 0 - 3 %   C REACTIVE PROTEIN, QT    Collection Time: 10/24/20  3:12 PM   Result Value Ref Range    C-Reactive protein >91.20 (H) 0.00 - 6.29 mg/L   METABOLIC PANEL, BASIC    Collection Time: 10/24/20  3:12 PM   Result Value Ref Range    Potassium 4.2 3.5 - 5.1 mEq/L    Chloride 103 98 - 107 mEq/L    Sodium 138 136 - 145 mEq/L    CO2 24 20 - 31 mEq/L    Glucose 125 (H) 74 - 106 mg/dl    BUN 8 (L) 9 - 23 mg/dl    Creatinine 0.62 0.55 - 1.02 mg/dl    GFR est AA >60.0      GFR est non-AA >60      Calcium 9.0 8.7 - 10.4 mg/dl    Anion gap 11 5 - 15 mmol/L   MAGNESIUM    Collection Time: 10/24/20  3:12 PM   Result Value Ref Range    Magnesium 1.6 1.6 - 2.6 mg/dL   AMMONIA    Collection Time: 10/24/20  3:12 PM   Result Value Ref Range    Ammonia, plasma 53.0 (H) 11.2 - 47.6 umol/L   METABOLIC PANEL, COMPREHENSIVE    Collection Time: 10/25/20  4:56 AM  Result Value Ref Range    Potassium 3.4 (L) 3.5 - 5.1 mEq/L    Chloride 102 98 - 107 mEq/L    Sodium 139 136 - 145 mEq/L    CO2 28 20 - 31 mEq/L    Glucose 109 (H) 74 - 106 mg/dl    BUN 8 (L) 9 - 23 mg/dl    Creatinine 0.52 (L) 0.55 - 1.02 mg/dl    GFR est AA >60.0      GFR est non-AA >60      Calcium 8.9 8.7 - 10.4 mg/dl    Anion gap 9 5 - 15 mmol/L    AST (SGOT) 35.0 (H) 0.0 - 33.9 U/L    ALT (SGPT) 12 10 - 49 U/L    Alk. phosphatase 87 46 - 116 U/L    Bilirubin, total 2.30 (H) 0.30 - 1.20 mg/dl    Protein, total 6.1 5.7 - 8.2 gm/dl    Albumin 3.2 (L) 3.4 - 5.0 gm/dl   CBC WITH AUTOMATED DIFF    Collection Time: 10/25/20  4:56 AM   Result Value Ref Range    WBC 8.9 4.0 - 11.0 1000/mm3    RBC 3.86 3.60 - 5.20 M/uL    HGB 13.2 13.0 - 17.2 gm/dl    HCT 39.6 37.0 - 50.0 %    MCV 102.6 (H) 80.0 - 98.0 fL    MCH 34.2 25.4 - 34.6 pg    MCHC 33.3 30.0 - 36.0 gm/dl    PLATELET 183 140 - 450  1000/mm3    MPV 11.3 (H) 6.0 - 10.0 fL    RDW-SD 52.6 (H) 36.4 - 46.3      NRBC 0 0 - 0      IMMATURE GRANULOCYTES 0.3 0.0 - 3.0 %    NEUTROPHILS 68.4 (H) 34 - 64 %    LYMPHOCYTES 18.1 (L) 28 - 48 %    MONOCYTES 11.1 1 - 13 %    EOSINOPHILS 1.8 0 - 5 %    BASOPHILS 0.3 0 - 3 %   C REACTIVE PROTEIN, QT    Collection Time: 10/25/20  4:56 AM   Result Value Ref Range    C-Reactive protein >91.20 (H) 0.00 - 9.90 mg/L   MAGNESIUM    Collection Time: 10/25/20  4:56 AM   Result Value Ref Range    Magnesium 1.6 1.6 - 2.6 mg/dL   AMMONIA    Collection Time: 10/25/20  5:18 AM   Result Value Ref Range    Ammonia, plasma 50.0 (H) 11.2 - 31.7 umol/L         Problem List:  Problem List as of 10/25/2020 Never Reviewed            Codes Class Noted - Resolved    Metabolic encephalopathy AJO-87-OM: G93.41  ICD-9-CM: 348.31  10/21/2020 - Present        Contusion of left hip ICD-10-CM: S70.02XA  ICD-9-CM: 924.01  10/21/2020 - Present        Left elbow contusion ICD-10-CM: S50.02XA  ICD-9-CM: 923.11  10/21/2020 - Present        Multiple falls ICD-10-CM: R29.6  ICD-9-CM: V15.88  10/21/2020 - Present        Deep venous thrombosis (HCC) ICD-10-CM: I82.409  ICD-9-CM: 453.40  Unknown - Present        Subdural hematoma caused by concussion ([place]) ICD-10-CM: V67.2C9O  ICD-9-CM: 852.29  08/27/2017 - Present        Subdural hematoma ([place]) ICD-10-CM: B09.6G8Z  ICD-9-CM: 432.1  08/27/2017 - Present       Medications reviewed  Current Facility-Administered Medications   Medication Dose Route Frequency    lactulose (CHRONULAC) 10 gram/15 mL solution 30 mL  20 g Oral BID    magnesium sulfate 2 g/50 ml IVPB (premix or compounded)  2 g IntraVENous ONCE    potassium chloride (K-DUR, KLOR-CON M20) SR tablet 40 mEq  40 mEq Oral ONCE    lisinopriL (PRINIVIL, ZESTRIL) tablet 40 mg  40 mg Oral DAILY    rifAXIMin (XIFAXAN) tablet 550 mg  550 mg Oral BID    enoxaparin (LOVENOX) injection 40 mg  40 mg SubCUTAneous Q24H    ceFAZolin (ANCEF) 1 g in 0.9% sodium chloride  (MBP/ADV) 50 mL MBP  1 g IntraVENous Q8H    levothyroxine (SYNTHROID) tablet 125 mcg  125 mcg Oral 6am    furosemide (LASIX) tablet 40 mg  40 mg Oral DAILY    metoprolol tartrate (LOPRESSOR) tablet 50 mg  50 mg Oral BID    albuterol-ipratropium (DUO-NEB) 2.5 MG-0.5 MG/3 ML  3 mL Nebulization Q4H PRN    magnesium oxide (MAG-OX) tablet 400 mg  400 mg Oral DAILY    melatonin tablet 3 mg  3 mg Oral QHS    potassium chloride (K-DUR, KLOR-CON M20) SR tablet 40 mEq  40 mEq Oral DAILY    calcium-vitamin D (OS-CAL +D3) 500 mg-200 unit per tablet 1 Tablet  1 Tablet Oral DAILY WITH LUNCH    hydrOXYchloroQUINE (PLAQUENIL) tablet 200 mg  200 mg Oral BID    [Held by provider] folic acid (FOLVITE) tablet 1 mg  1 mg Oral DAILY    naloxone (NARCAN) injection 0.1 mg  0.1 mg IntraVENous PRN    acetaminophen (TYLENOL) tablet 650 mg  650 mg Oral Q4H PRN    Or    acetaminophen (TYLENOL) solution 650 mg  650 mg Oral Q4H PRN    Or    acetaminophen (TYLENOL) suppository 650 mg  650 mg Rectal Q4H PRN    [Held by provider] HYDROcodone-acetaminophen (NORCO) 5-325 mg per tablet 1 Tablet  1 Tablet Oral Q6H PRN    ondansetron (ZOFRAN) injection 4 mg  4 mg IntraVENous Q4H PRN       Care Plan discussed with: Patient/Family    Total time spent with patient: 40 minutes.    Colen Darling, MD  October 25, 2020

## 2020-10-25 NOTE — Progress Notes (Signed)
Attempted to reach son, Cristal Deer but phone went to voicemail. Left message asking for a call back. Pt is from [place] [facility]. PT/OT may recommend, pending progress, SNF prior to returning to ALF. Will attempt CMA again later.

## 2020-10-26 MED FILL — LACTULOSE 20 GRAM/30 ML ORAL SOLUTION: 20 gram/30 mL | ORAL | Qty: 30

## 2020-10-26 MED FILL — XIFAXAN 550 MG TABLET: 550 mg | ORAL | Qty: 1

## 2020-10-26 MED FILL — CEPHALEXIN 250 MG CAP: 250 mg | ORAL | Qty: 2

## 2020-10-26 MED FILL — FUROSEMIDE 40 MG TAB: 40 mg | ORAL | Qty: 1

## 2020-10-26 MED FILL — MAGNESIUM OXIDE 400 MG TAB: 400 mg | ORAL | Qty: 1

## 2020-10-26 MED FILL — HYDROXYCHLOROQUINE 200 MG TAB: 200 mg | ORAL | Qty: 1

## 2020-10-26 MED FILL — ENOXAPARIN 40 MG/0.4 ML SUB-Q SYRINGE: 40 mg/0.4 mL | SUBCUTANEOUS | Qty: 0.4

## 2020-10-26 MED FILL — OYSTER SHELL CALCIUM-VITAMIN D3 500 MG-5 MCG (200 UNIT) TABLET: 500 mg-5 mcg (200 unit) | ORAL | Qty: 1

## 2020-10-26 MED FILL — POTASSIUM CHLORIDE SR 20 MEQ TAB, PARTICLES/CRYSTALS: 20 mEq | ORAL | Qty: 2

## 2020-10-26 MED FILL — LISINOPRIL 20 MG TAB: 20 mg | ORAL | Qty: 2

## 2020-10-26 MED FILL — ONDANSETRON (PF) 4 MG/2 ML INJECTION: 4 mg/2 mL | INTRAMUSCULAR | Qty: 2

## 2020-10-26 MED FILL — LEVOTHYROXINE 125 MCG TAB: 125 mcg | ORAL | Qty: 1

## 2020-10-26 MED FILL — MELATONIN 3 MG TAB: 3 mg | ORAL | Qty: 1

## 2020-10-26 MED FILL — METOPROLOL TARTRATE 25 MG TAB: 25 mg | ORAL | Qty: 1

## 2020-10-26 NOTE — Progress Notes (Signed)
Progress Notes by Drusilla Kanner, MD at 10/26/20 5053                Author: Drusilla Kanner, MD  Service: Internal Medicine  Author Type: Physician       Filed: 10/27/20 0805  Date of Service: 10/26/20 0807  Status: Signed          Editor: Drusilla Kanner, MD (Physician)               Progress Note by Dr.Chelli Yerkes      Patient: [name] [name]               Sex: female           DOA: 10/21/2020         Date of Birth:  [birth year]      Age:   87 y.o.        LOS:  LOS: 5 days       MRN: [id number]                    CSN:  [id number]                 Subjective:        Subjective       Does not seem to be in distress awake and alert.  Denies pain        Review of Systems:         As above        Past Medical/Surgical History:          Past Medical History:        Diagnosis  Date         ?  Arthritis       ?  Deep venous thrombosis ([place])       ?  Endocrine disease            hypothyroid         ?  Hemochromatosis           ?  Hypertension            Past Surgical History:         Procedure  Laterality  Date          ?  HX ORTHOPAEDIC              left knee replacement          Social History:          Social History          Socioeconomic History         ?  Marital status:  WIDOWED       Tobacco Use         ?  Smoking status:  Never     ?  Smokeless tobacco:  Never       Vaping Use         ?  Vaping Use:  Never used       Substance and Sexual Activity         ?  Alcohol use:  No         ?  Drug use:  No          Family History:     History reviewed. No pertinent family history.     Allergies:          Allergies        Allergen  Reactions         ?  Shellfish Derived  Hives         ?  Sulfa (Sulfonamide Antibiotics)  Hives             Current Medications:          Current Facility-Administered Medications       Medication        ?  lactulose (CHRONULAC) 10 gram/15 mL solution 30 mL     ?  metoprolol tartrate (LOPRESSOR) tablet 75 mg     ?  cephALEXin (KEFLEX) capsule 500 mg     ?  lisinopriL  (PRINIVIL, ZESTRIL) tablet 40 mg     ?  rifAXIMin (XIFAXAN) tablet 550 mg     ?  enoxaparin (LOVENOX) injection 40 mg     ?  levothyroxine (SYNTHROID) tablet 125 mcg     ?  furosemide (LASIX) tablet 40 mg     ?  albuterol-ipratropium (DUO-NEB) 2.5 MG-0.5 MG/3 ML     ?  magnesium oxide (MAG-OX) tablet 400 mg     ?  melatonin tablet 3 mg     ?  potassium chloride (K-DUR, KLOR-CON M20) SR tablet 40 mEq     ?  calcium-vitamin D (OS-CAL +D3) 500 mg-200 unit per tablet 1 Tablet     ?  hydrOXYchloroQUINE (PLAQUENIL) tablet 200 mg     ?  [Held by provider] folic acid (FOLVITE) tablet 1 mg     ?  naloxone (NARCAN) injection 0.1 mg     ?  acetaminophen (TYLENOL) tablet 650 mg          Or        ?  acetaminophen (TYLENOL) solution 650 mg          Or        ?  acetaminophen (TYLENOL) suppository 650 mg     ?  [Held by provider] HYDROcodone-acetaminophen (NORCO) 5-325 mg per tablet 1 Tablet        ?  ondansetron (ZOFRAN) injection 4 mg                Physical Exam:        Patient Vitals for the past 24 hrs:            Temp  Pulse  Resp  BP  SpO2            10/26/20 0100  97.9 ??F (36.6 ??C)  73  17  (!) 178/108  95 %            10/25/20 2050  98.6 ??F (37 ??C)  79  17  (!) 148/102  96 %     10/25/20 2000  --  --  --  --  96 %     10/25/20 1732  99.3 ??F (37.4 ??C)  70  20  (!) 150/82  98 %     10/25/20 1328  98.1 ??F (36.7 ??C)  68  19  120/84  96 %            10/25/20 0955  97.9 ??F (36.6 ??C)  67  20  (!) 154/84  99 %        Oxygen Therapy:   Oxygen Therapy   O2 Sat (%): 95 % (10/26/20 0100)   Pulse via Oximetry: 64 beats per minute (10/21/20 1838)   O2 Device: None (Room air) (10/25/20 2000)      GENERAL:Patient appears in no distress.Stated Age   SKIN: With no rash.No ulcers   HNENT: Negative with  no JVDs or carotid bruit or lymph nodes or icterus   LUNGS: Are clear with good bilateral air entry   HEART: Normal S1-S2 regular rate.unenlarged   ABDOMEN: Soft with active bowel sounds and no megalies   EXTREMITIES: No edema or ulcer or  cyanotic changes   NEURO: Awake and alert. CN intact   PSYCH:    BLADDER: Empty with no suprapubic tenderness   LYMPHATIC: No enlarged lymph nodes                Date  10/25/20 0700 - 10/26/20 0659  10/26/20 0700 - 10/27/20 0659             Shift  0700-1859  1900-0659  24 Hour Total  0700-1859  1900-0659  24 Hour Total       INTAKE             Shift Total(mL/kg)                   OUTPUT             Urine(mL/kg/hr)                           Urine Occurrence(s)  1 x    1 x                   Shift Total(mL/kg)                         NET                         Weight (kg)  76.2  76.2  76.2  76.2  76.2  76.2          Diagnostic:          Recent Labs            10/25/20   0456  10/24/20   1512     RBC  3.86  3.92     WBC  8.9  8.5     HGB  13.2  13.4     HCT  39.6  39.5     PLT  183  177     NA  139  138     K  3.4*  4.2     CL  102  103     CO2  28  24     BUN  8*  8*     CREA  0.52*  0.62     GLU  109*  125*     CA  8.9  9.0     AP  87   --      TP  6.1   --          ALB  3.2*   --            No results for input(s): PH, PCO2, PO2 in the last 72 hours.   No results for input(s): CPK, CKNDX, TROIQ in the last 72 hours.      No lab exists for component: CPKMB   No results found for: PH, PHI, PCO2, PCO2I, PO2, PO2I, HCO3, HCO3I, FIO2, FIO2I     Lab Results         Component  Value  Date/Time            Color  LIGHT YELLOW (A)  09/24/2020  03:47 PM       Appearance  CLEAR  09/24/2020 03:47 PM       Specific gravity  1.010  09/24/2020 03:47 PM       pH (UA)  7.0  09/24/2020 03:47 PM       Protein  TRACE (A)  09/24/2020 03:47 PM       Glucose  NEGATIVE  09/24/2020 03:47 PM       Ketone  NEGATIVE  09/24/2020 03:47 PM       Bilirubin  NEGATIVE  09/24/2020 03:47 PM       Urobilinogen  0.2  09/24/2020 03:47 PM       Nitrites  NEGATIVE  09/24/2020 03:47 PM       Leukocyte Esterase  NEGATIVE  09/24/2020 03:47 PM       Bacteria  OCCASIONAL  09/06/2020 03:26 PM            WBC  1-4  09/06/2020 03:26 PM        No image results  found.        Lab Results         Component  Value  Date/Time            ALT (SGPT)  12  10/25/2020 04:56 AM       AST (SGOT)  35.0 (H)  10/25/2020 04:56 AM       Alk. phosphatase  87  10/25/2020 04:56 AM       Bilirubin, direct  0.7 (H)  10/21/2020 12:52 PM            Bilirubin, total  2.30 (H)  10/25/2020 04:56 AM          All Micro Results                  Procedure  Component  Value  Units  Date/Time           COVID-19 (INPATIENT TESTING) [[id number]]  (Abnormal)  Collected: 10/21/20 1818            Order Status: Completed  Specimen: NASOPHARYNGEAL SWAB  Updated: 10/21/20 2030                COVID-19  POSITIVE                    Comment:  Negative results do not preclude SARS-CoV-2 infection and should not be used as the sole basis   for treatment or other patient management decisions. Negative results must be combined with   clinical observations, patient history, and epidemiological information.      Testing with the Xpert Xpress SARS-CoV-2 test is intended for use by trained operators who are   proficient in performing tests using either GeneXpert Dx, GeneXpert Infinity and/or [employer]. The Xpert Xpress SARS-CoV-2 test is only for use under the Food and Drug   Administration's Emergency Use Authorization.                           CULTURE, URINE [[id number]]              Order Status: No result  Specimen: Urine from Clean catch                    No components found for: TROP     Echo Results  (Last 48 hours)  None                     Coag   No results for input(s): PTP, INR, APTT, INREXT in the last 72 hours.      Hepatic Function     Recent Labs           10/25/20   0456     TBILI  2.30*     ALT  12     AP  87     TP  6.1        ALB  3.2*           Recent Glucose Results: No results found for: GLU, GLUPOC, GLUCPOC      Last Point of Care HGB A1C   No results found for: HBA1CPOC      XR CHEST SNGL V      Result Date: 10/24/2020   Indication: Cough, Covid Frontal view chest Compared  to 10/22/2020 the heart remains enlarged with tortuous calcified aorta. Mild interstitial thickening seen without consolidation. Advanced joint space narrowing and osteophyte formation seen at the glenohumeral  joints.       IMPRESSION: Persistent cardiomegaly with atherosclerotic disease of aorta. No acute airspace disease. Degenerative joint disease both shoulders.       XR CHEST SNGL V      Result Date: 10/22/2020   Indication: Covid Frontal view chest Compared to 09/06/2020 the heart remains enlarged with calcified aorta. Interstitial thickening seen throughout the lungs with small left pleural effusion but no consolidation. Shallower inspiration. Degenerative joint  space narrowing and osteophyte formation with sclerosis bilateral glenohumeral joints and humeral heads.       IMPRESSION: Shallower inspiration without consolidation. Cardiomegaly. Small left pleural effusion. Atherosclerotic disease of aorta. Degenerative joint disease bilateral shoulders.       XR SHOULDER LT AP/LAT MIN 2 V      Result Date: 10/21/2020   EXAM: XR SHOULDER LT AP/LAT MIN 2 V INDICATION: Left elbow and shoulder pain after fall.  History of rheumatoid arthritis.  COMPARISON: None available. WORKSTATION ID: [id number] TECHNIQUE: 3 views left shoulder obtained in 4 exposures. FINDINGS: No  acute fracture or dislocation. Severe AC joint osteoarthritis. Severe glenohumeral joint osteoarthritis. Imaged lung is clear.       IMPRESSION: No fracture or dislocation.       XR ELBOW LT AP/LAT      Result Date: 10/21/2020   EXAMINATION: XR ELBOW LT AP/LAT INDICATION: injury COMPARISON: No comparison available. WORKSTATION ID: [id number] TECHNIQUE: 2 views left elbow obtained in 4 exposures. FINDINGS: No displaced fracture. No dislocation. Large collar osteophyte of the  radial head somewhat obscures evaluation. Severe osteoarthritis and capsular heterotopic ossification along the anterior joint capsule. No joint effusion.       IMPRESSION: No acute  fracture or dislocation.       XR HIP LT W OR WO PELV 2-3 VWS      Result Date: 10/21/2020   EXAMINATION: XR HIP LT W OR WO PELV 2-3 VWS INDICATION: Left hip pain after fall.  History of hip replacement. COMPARISON: 09/13/2020 WORKSTATION ID: [id number] TECHNIQUE: AP pelvis, frontal and lateral views of the left hip. FINDINGS: Left total hip  arthroplasty with trochanteric claw plate and multiple cerclage wires appears intact. No periprosthetic fracture. No evidence of loosening. Right total hip arthroplasty appears grossly intact on the frontal view of the pelvis.       IMPRESSION:  No acute osseous abnormality.  MRI LUMB SPINE WO CONT      Result Date: 10/10/2020   INDICATION: Sciatica, right side , back pain, lumbar fracture, TECHNIQUE: Multiplanar, multisequence MRI lumbar spine without IV contrast COMPARISON:Lumbar radiographs dated 09/13/2020 FINDINGS: Study sensitivity is moderately limited due to motion artifact  on several pulse sequences. Vertebrae: There is chronic anterior wedge compression fracture of L1 with approximately 40% height loss. Mild superior endplate compression fracture of L2 with approximately 10% height loss. No edema on the STIR images. Findings  of chronic compression fractures. No evidence of acute compression fracture. Trace retrolisthesis at L1-L2 and L2-L3. Disc Spaces: Advanced disc space narrowing at every lumbar disc level. Exuberant degenerative endplate changes at Y4-I3. Multilevel anterior  osteophytes. Conus: Terminates at L1-L2. Soft Tissues: Unremarkable. LEVELS: T12-L1: Disc osteophyte complex. Mild central canal stenosis and moderate right lateral recess stenosis. Moderate ligamentum flavum thickening. Moderate facet arthropathy. Moderate  right and mild left foraminal stenosis. L1-L2: Disc osteophyte complex. Severe ligamentum flavum thickening. Mild facet arthropathy. Mild bilateral foraminal stenosis. Mild central canal stenosis. L2-L3: Disc osteophyte complex.  Moderate ligamentum flavum  thickening. Mild central canal and moderate lateral recess stenosis. Moderate facet arthropathy. Mild bilateral foraminal stenosis. L3-L4: Disc osteophyte complex. Mild ligamentum flavum thickening. Moderate central canal stenosis. Mild facet arthropathy.  Mild bilateral foraminal stenosis. L4-L5: Disc osteophyte complex. Moderate ligamentum flavum thickening. Moderate facet arthropathy. Mild central canal and moderate lateral recess stenosis. Moderate left and mild right foraminal stenosis. L5-S1: Disc  osteophyte complex. Moderate facet arthropathy. Mild central canal stenosis. Moderate facet arthropathy. Mild bilateral foraminal stenosis.       IMPRESSION: 1.  Chronic L1 and L2 compression fractures. 2.  No evidence of acute lumbar compression fracture. 3.  Mild central canal and moderate right lateral recess stenosis at T12-L1. 4.  Mild central canal stenosis at L1-L2 and L5-S1. 5.  Mild central  canal and moderate lateral recess stenosis at L2-L3 and L4-L5. 6.  Moderate central canal stenosis at L3-L4. 7.  Multilevel degenerative changes and foraminal stenoses.       CT HEAD WO CONT      Result Date: 10/25/2020   INDICATION: change in mental status. Altered mental status COMPARISON: 10/21/2020 TECHNIQUE: Axial Head CT without IV contrast. Sagittal and coronal reconstructions were performed. All CT exams at this facility use one or more dose reduction techniques  including automatic exposure control, mA/kV adjustment per patient's size, or iterative reconstruction technique. FINDINGS: There is age related cerebral atrophy with chronic small vessel ischemic changes in the white matter. There is no mass, hemorrhage,  acute ischemia, or hydrocephalus. Atherosclerosis and dolichoectasia of the vertebral basilar arteries. Calvarium is intact.       IMPRESSION: 1.  No acute intracranial abnormalities. Chronic age-related changes.       CT HEAD WO CONT      Result Date: 10/21/2020   EXAM:  CT  SPINE CERV WO CONT, CT HEAD WO CONT INDICATION: Recurrent falls.  History of subdural.  History of rheumatoid arthritis COMPARISON: 2019 TECHNIQUE: CT axial images of the brain and cervical spine without contrast. Coronal and sagittal reconstructed  images were performed. All CT exams at this facility use one or more dose reduction techniques including automatic exposure control, mA/kV adjustment per patient's size, or iterative reconstruction technique. WORKSTATION ID: [id number] FINDINGS: BRAIN:  No acute intracranial hemorrhage, mass effect, or CT evidence of acute infarct. Mild diffuse parenchymal volume loss. No hydrocephalus. There is subcortical and periventricular hypodensity compatible with gliosis due to  nonacute ischemic change. Orbits  are unremarkable.  Paranasal sinuses and mastoid air cells are unremarkable. Calvarium intact. CERVICAL SPINE: No fracture or traumatic subluxation. Multilevel cervical spondylosis noted. Paravertebral soft tissues are unremarkable. Imaged lung apices  are unremarkable.       IMPRESSION: 1. No acute intracranial abnormality. 2. No acute fracture or traumatic subluxation of the cervical spine.       CT HEAD WO CONT      Result Date: 09/27/2020   INDICATION: Delirium. Altered mental status, hallucinations COMPARISON: 08/30/2017 TECHNIQUE: Axial Head CT without IV contrast. Sagittal and coronal reconstructions were performed. All CT exams at this facility use one or more dose reduction techniques  including automatic exposure control, mA/kV adjustment per patient's size, or iterative reconstruction technique. FINDINGS: There is age related cerebral atrophy with chronic small vessel ischemic changes in the white matter. There is no mass, hemorrhage,  acute ischemia, or hydrocephalus. Calvarium is intact.       IMPRESSION: 1.  No acute intracranial abnormalities. Chronic age-related changes.       CT SPINE CERV WO CONT      Result Date: 10/21/2020   EXAM:  CT SPINE CERV WO CONT,  CT HEAD WO CONT INDICATION: Recurrent falls.  History of subdural.  History of rheumatoid arthritis COMPARISON: 2019 TECHNIQUE: CT axial images of the brain and cervical spine without contrast. Coronal and sagittal reconstructed  images were performed. All CT exams at this facility use one or more dose reduction techniques including automatic exposure control, mA/kV adjustment per patient's size, or iterative reconstruction technique. WORKSTATION ID: [id number] FINDINGS: BRAIN:  No acute intracranial hemorrhage, mass effect, or CT evidence of acute infarct. Mild diffuse parenchymal volume loss. No hydrocephalus. There is subcortical and periventricular hypodensity compatible with gliosis due to nonacute ischemic change. Orbits  are unremarkable.  Paranasal sinuses and mastoid air cells are unremarkable. Calvarium intact. CERVICAL SPINE: No fracture or traumatic subluxation. Multilevel cervical spondylosis noted. Paravertebral soft tissues are unremarkable. Imaged lung apices  are unremarkable.       IMPRESSION: 1. No acute intracranial abnormality. 2. No acute fracture or traumatic subluxation of the cervical spine.       [place] ABD LTD      Result Date: 10/22/2020   EXAMINATION: [place] ABD LTD CLINICAL INDICATION: hepatic encephalopathy COMPARISON:  06/12/2013;  . TECHNIQUE: Abdominal ultrasound FINDINGS: Liver 16.5. Prominent periportal echoes. No discrete masses. No intrahepatic biliary duct dilatation. Gallbladder  normal. Negative sonographic Murphy's sign. Common bile duct 6 mm. Pancreatic head and body  unremarkable .  Pancreatic tail   partially obscured and cannot be evaluated. Proximal aorta and IVC Unremarkable. Right kidney 9.9 x 4.9 x 4.0 cm . Isoechoic.  No hydronephrosis.       IMPRESSION: 1.  Prominent periportal intrahepatic hepatic echoes. May be seen in congestion or inflammation. Possible normal variant. 2. Gallbladder normal. 3. No hydronephrosis           Telemetry: normal sinus rhythm        Admitting  Diagnosis:          Patient Active Problem List        Diagnosis  Code         ?  Deep venous thrombosis (HCC)  I82.409     ?  Subdural hematoma caused by concussion [facility])  S06.5X9A     ?  Subdural hematoma ([place])  S06.5X9A     ?  Metabolic encephalopathy  H08.65     ?  Contusion of left hip  S70.02XA     ?  Left elbow contusion  S50.02XA         ?  Multiple falls  R29.6                Assessment :        ??  COVID-19 infection   ??  Cellulitis of face left   ??  Hepatic encephalopathy   ??  History of subdural hematoma   ??  Chronic liver disease           Plan :        ??  Continue supportive treatment   ??  Mental status seems to be improving and therefore continue current treatment           General :            Risk of deterioration: moderate            Total time spent : 32 Minutes                    Care Plan discussed with: Patient, Nursing Staff, and >50% of time spent in counseling and coordination of care      Discussed:  Care Plan and D/C Planning      Prophylaxis:  DVT and Pressure ulcers      Disposition:  Home w/Family      Reviewed: Medications, allergies, clinical lab test results and imaging results have been reviewed. Any abnormal findings have been addressed.    Dragon medical dictation software was used for portions of this report. Unintended grammatical errors may occur.      Drusilla Kanner, MD   October 26, 2020

## 2020-10-27 ENCOUNTER — Inpatient Hospital Stay: Admit: 2020-10-27 | Payer: MEDICARE | Primary: Internal Medicine

## 2020-10-27 LAB — URINALYSIS W/ RFLX MICROSCOPIC
Bilirubin, Urine: NEGATIVE
Bilirubin: NEGATIVE
Blood, Urine: NEGATIVE
Blood: NEGATIVE
Glucose, Ur: NEGATIVE mg/dl
Glucose: NEGATIVE mg/dl
Ketone: NEGATIVE mg/dl
Ketones, Urine: NEGATIVE mg/dl
Leukocyte Esterase, Urine: NEGATIVE
Leukocyte Esterase: NEGATIVE
Nitrite, Urine: NEGATIVE
Nitrites: NEGATIVE
Specific Gravity, UA: >=1.03 (ref 1.005–1.030)
Specific gravity: >=1.03 (ref 1.005–1.030)
Urobilinogen, UA, POCT: 0.2 mg/dl (ref 0.0–1.0)
Urobilinogen: 0.2 mg/dl (ref 0.0–1.0)
pH (UA): 6 (ref 5.0–9.0)
pH, UA: 6 (ref 5.0–9.0)

## 2020-10-27 LAB — METABOLIC PANEL, COMPREHENSIVE
ALT (SGPT): <7 U/L — ABNORMAL LOW (ref 10–49)
AST (SGOT): 21 U/L (ref 0.0–33.9)
Albumin: 3.6 gm/dl (ref 3.4–5.0)
Alk. phosphatase: 106 U/L (ref 46–116)
Anion gap: 8 mmol/L (ref 5–15)
BUN: 15 mg/dl (ref 9–23)
Bilirubin, total: 3.1 mg/dl — ABNORMAL HIGH (ref 0.30–1.20)
CO2: 31 mEq/L (ref 20–31)
Calcium: 10.4 mg/dl (ref 8.7–10.4)
Chloride: 98 mEq/L (ref 98–107)
Creatinine: 0.81 mg/dl (ref 0.55–1.02)
GFR est AA: >60
GFR est non-AA: >60
Glucose: 124 mg/dl — ABNORMAL HIGH (ref 74–106)
Potassium: 3.8 mEq/L (ref 3.5–5.1)
Protein, total: 7.3 gm/dl (ref 5.7–8.2)
Sodium: 137 mEq/L (ref 136–145)

## 2020-10-27 LAB — CBC WITH AUTOMATED DIFF
BASOPHILS: 0.5 % (ref 0–3)
EOSINOPHILS: 0.7 % (ref 0–5)
HCT: 45 % (ref 37.0–50.0)
HGB: 15.3 gm/dl (ref 13.0–17.2)
IMMATURE GRANULOCYTES: 0.2 % (ref 0.0–3.0)
LYMPHOCYTES: 7.2 % — ABNORMAL LOW (ref 28–48)
MCH: 34.4 pg (ref 25.4–34.6)
MCHC: 34 gm/dl (ref 30.0–36.0)
MCV: 101.1 fL — ABNORMAL HIGH (ref 80.0–98.0)
MONOCYTES: 7.9 % (ref 1–13)
MPV: 11.1 fL — ABNORMAL HIGH (ref 6.0–10.0)
NEUTROPHILS: 83.5 % — ABNORMAL HIGH (ref 34–64)
NRBC: 0 (ref 0–0)
PLATELET: 260 10*3/uL (ref 140–450)
RBC: 4.45 M/uL (ref 3.60–5.20)
RDW-SD: 51.7 — ABNORMAL HIGH (ref 36.4–46.3)
WBC: 15 10*3/uL — ABNORMAL HIGH (ref 4.0–11.0)

## 2020-10-27 LAB — POC URINE MICROSCOPIC

## 2020-10-27 LAB — AMMONIA
Ammonia, plasma: 35 umol/L — ABNORMAL HIGH (ref 11.2–31.7)
Ammonia: 35 umol/L — ABNORMAL HIGH (ref 11.2–31.7)

## 2020-10-27 LAB — CBC WITH AUTO DIFFERENTIAL
Basophils %: 0.5 % (ref 0–3)
Eosinophils %: 0.7 % (ref 0–5)
Hematocrit: 45 % (ref 37.0–50.0)
Hemoglobin: 15.3 gm/dl (ref 13.0–17.2)
Immature Granulocytes: 0.2 % (ref 0.0–3.0)
Lymphocytes %: 7.2 % — ABNORMAL LOW (ref 28–48)
MCH: 34.4 pg (ref 25.4–34.6)
MCHC: 34 gm/dl (ref 30.0–36.0)
MCV: 101.1 fL — ABNORMAL HIGH (ref 80.0–98.0)
MPV: 11.1 fL — ABNORMAL HIGH (ref 6.0–10.0)
Monocytes %: 7.9 % (ref 1–13)
Neutrophils %: 83.5 % — ABNORMAL HIGH (ref 34–64)
Nucleated RBCs: 0 (ref 0–0)
Platelets: 260 10*3/uL (ref 140–450)
RBC: 4.45 M/uL (ref 3.60–5.20)
RDW-SD: 51.7 — ABNORMAL HIGH (ref 36.4–46.3)
WBC: 15 10*3/uL — ABNORMAL HIGH (ref 4.0–11.0)

## 2020-10-27 LAB — COMPREHENSIVE METABOLIC PANEL
ALT: <7 U/L — ABNORMAL LOW (ref 10–49)
AST: 21 U/L (ref 0.0–33.9)
Albumin: 3.6 gm/dl (ref 3.4–5.0)
Alkaline Phosphatase: 106 U/L (ref 46–116)
Anion Gap: 8 mmol/L (ref 5–15)
BUN: 15 mg/dl (ref 9–23)
CO2: 31 mEq/L (ref 20–31)
Calcium: 10.4 mg/dl (ref 8.7–10.4)
Chloride: 98 mEq/L (ref 98–107)
Creatinine: 0.81 mg/dl (ref 0.55–1.02)
EGFR IF NonAfrican American: >60
GFR African American: >60
Glucose: 124 mg/dl — ABNORMAL HIGH (ref 74–106)
Potassium: 3.8 mEq/L (ref 3.5–5.1)
Sodium: 137 mEq/L (ref 136–145)
Total Bilirubin: 3.1 mg/dl — ABNORMAL HIGH (ref 0.30–1.20)
Total Protein: 7.3 gm/dl (ref 5.7–8.2)

## 2020-10-27 MED ORDER — HALOPERIDOL LACTATE 5 MG/ML IJ SOLN
5 mg/mL | Freq: Four times a day (QID) | INTRAMUSCULAR | Status: DC | PRN
Start: 2020-10-27 — End: 2020-10-28

## 2020-10-27 MED ORDER — POLYETHYLENE GLYCOL 3350 17 GRAM (100 %) ORAL POWDER PACKET
17 gram | Freq: Once | ORAL | Status: AC
Start: 2020-10-27 — End: 2020-10-27
  Administered 2020-10-27: 22:00:00 via ORAL

## 2020-10-27 MED FILL — MAGNESIUM OXIDE 400 MG TAB: 400 mg | ORAL | Qty: 1

## 2020-10-27 MED FILL — CEPHALEXIN 500 MG CAP: 500 mg | ORAL | Qty: 1

## 2020-10-27 MED FILL — ONDANSETRON (PF) 4 MG/2 ML INJECTION: 4 mg/2 mL | INTRAMUSCULAR | Qty: 2

## 2020-10-27 MED FILL — ACETAMINOPHEN 325 MG/10.15 ML ORAL SOLN: 325 mg/10.15 mL | ORAL | Qty: 20.3

## 2020-10-27 MED FILL — HYDROXYCHLOROQUINE 200 MG TAB: 200 mg | ORAL | Qty: 1

## 2020-10-27 MED FILL — LACTULOSE 20 GRAM/30 ML ORAL SOLUTION: 20 gram/30 mL | ORAL | Qty: 30

## 2020-10-27 MED FILL — POLYETHYLENE GLYCOL 3350 17 GRAM (100 %) ORAL POWDER PACKET: 17 gram | ORAL | Qty: 1

## 2020-10-27 MED FILL — MELATONIN 3 MG TAB: 3 mg | ORAL | Qty: 1

## 2020-10-27 MED FILL — OYSTER SHELL CALCIUM-VITAMIN D3 500 MG-5 MCG (200 UNIT) TABLET: 500 mg-5 mcg (200 unit) | ORAL | Qty: 1

## 2020-10-27 MED FILL — LISINOPRIL 20 MG TAB: 20 mg | ORAL | Qty: 2

## 2020-10-27 MED FILL — ENOXAPARIN 40 MG/0.4 ML SUB-Q SYRINGE: 40 mg/0.4 mL | SUBCUTANEOUS | Qty: 0.4

## 2020-10-27 MED FILL — XIFAXAN 550 MG TABLET: 550 mg | ORAL | Qty: 1

## 2020-10-27 MED FILL — FUROSEMIDE 40 MG TAB: 40 mg | ORAL | Qty: 1

## 2020-10-27 MED FILL — POTASSIUM CHLORIDE SR 20 MEQ TAB, PARTICLES/CRYSTALS: 20 mEq | ORAL | Qty: 2

## 2020-10-27 MED FILL — LEVOTHYROXINE 125 MCG TAB: 125 mcg | ORAL | Qty: 1

## 2020-10-27 MED FILL — METOPROLOL TARTRATE 25 MG TAB: 25 mg | ORAL | Qty: 1

## 2020-10-27 NOTE — Progress Notes (Signed)
Progress Notes by Drusilla Kanner, MD at 10/27/20 6213                Author: Drusilla Kanner, MD  Service: Internal Medicine  Author Type: Physician       Filed: 10/27/20 1016  Date of Service: 10/27/20 0805  Status: Signed          Editor: Drusilla Kanner, MD (Physician)               Progress Note by Dr.Chandra Feger      Patient: [name] [name]               Sex: female           DOA: 10/21/2020         Date of Birth:  [birth year]      Age:   87 y.o.        LOS:  LOS: 6 days       MRN: [id number]                    CSN:  [id number]                 Subjective:        Subjective    Had nausea and vomiting with abdominal distention.   Mental status seems to be sta abdomen is distended ble and patient answers questions appropriately   Does not seem to be in distress awake and alert.  Denies pain        Review of Systems:         As above        Past Medical/Surgical History:          Past Medical History:        Diagnosis  Date         ?  Arthritis       ?  Deep venous thrombosis ([place])       ?  Endocrine disease            hypothyroid         ?  Hemochromatosis           ?  Hypertension            Past Surgical History:         Procedure  Laterality  Date          ?  HX ORTHOPAEDIC              left knee replacement          Social History:          Social History          Socioeconomic History         ?  Marital status:  WIDOWED       Tobacco Use         ?  Smoking status:  Never     ?  Smokeless tobacco:  Never       Vaping Use         ?  Vaping Use:  Never used       Substance and Sexual Activity         ?  Alcohol use:  No         ?  Drug use:  No          Family History:     History reviewed. No pertinent family history.  Allergies:          Allergies        Allergen  Reactions         ?  Shellfish Derived  Hives         ?  Sulfa (Sulfonamide Antibiotics)  Hives             Current Medications:          Current Facility-Administered Medications       Medication        ?  lactulose  (CHRONULAC) 10 gram/15 mL solution 30 mL     ?  metoprolol tartrate (LOPRESSOR) tablet 75 mg     ?  cephALEXin (KEFLEX) capsule 500 mg     ?  lisinopriL (PRINIVIL, ZESTRIL) tablet 40 mg     ?  rifAXIMin (XIFAXAN) tablet 550 mg     ?  enoxaparin (LOVENOX) injection 40 mg     ?  levothyroxine (SYNTHROID) tablet 125 mcg     ?  furosemide (LASIX) tablet 40 mg     ?  albuterol-ipratropium (DUO-NEB) 2.5 MG-0.5 MG/3 ML     ?  magnesium oxide (MAG-OX) tablet 400 mg     ?  melatonin tablet 3 mg     ?  potassium chloride (K-DUR, KLOR-CON M20) SR tablet 40 mEq     ?  calcium-vitamin D (OS-CAL +D3) 500 mg-200 unit per tablet 1 Tablet     ?  hydrOXYchloroQUINE (PLAQUENIL) tablet 200 mg     ?  [Held by provider] folic acid (FOLVITE) tablet 1 mg     ?  naloxone (NARCAN) injection 0.1 mg     ?  acetaminophen (TYLENOL) tablet 650 mg          Or        ?  acetaminophen (TYLENOL) solution 650 mg          Or        ?  acetaminophen (TYLENOL) suppository 650 mg     ?  [Held by provider] HYDROcodone-acetaminophen (NORCO) 5-325 mg per tablet 1 Tablet        ?  ondansetron (ZOFRAN) injection 4 mg                Physical Exam:        Patient Vitals for the past 24 hrs:            Temp  Pulse  Resp  BP  SpO2            10/27/20 0505  97.8 ??F (36.6 ??C)  83  17  (!) 163/100  92 %            10/27/20 0121  --  --  --  (!) 146/75  --     10/27/20 0118  98.3 ??F (36.8 ??C)  75  (!) 188  (!) 149/100  96 %     10/26/20 2139  97.9 ??F (36.6 ??C)  79  17  (!) 168/90  92 %     10/26/20 1734  98.9 ??F (37.2 ??C)  75  18  (!) 152/88  95 %            10/26/20 1232  99.3 ??F (37.4 ??C)  73  22  (!) 169/86  96 %           Oxygen Therapy:   Oxygen Therapy   O2 Sat (%): 92 % (10/27/20 0505)   Pulse via Oximetry: 64 beats  per minute (10/21/20 1838)   O2 Device: None (Room air) (10/27/20 0400)      GENERAL:Patient appears in no distress.Stated Age   SKIN: With no rash.No ulcers   HNENT: Negative with no JVDs or carotid bruit or lymph nodes or icterus   LUNGS: Are  clear with good bilateral air entry   HEART: Normal S1-S2 regular rate.unenlarged   ABDOMEN: Abdomen is distended.  Bowel sounds are hypoactive   EXTREMITIES: No edema or ulcer or cyanotic changes   NEURO: Awake and alert. CN intact   PSYCH: LES   BLADDER: Empty with no suprapubic tenderness   LYMPHATIC: No enlarged lymph nodes                Date  10/26/20 0700 - 10/27/20 0659  10/27/20 0700 - 10/28/20 0659             Shift  0700-1859  1900-0659  24 Hour Total  0700-1859  1900-0659  24 Hour Total       INTAKE             Shift Total(mL/kg)                   OUTPUT             Urine(mL/kg/hr)  900(1)    900(0.5)                     Urine Voided  900    900                   Shift Total(mL/kg)  900(11.8)    900(11.8)                   NET  -900    -900                   Weight (kg)  76.2  76.2  76.2  76.2  76.2  76.2             Diagnostic:          Recent Labs            10/25/20   0456  10/24/20   1512     RBC  3.86  3.92     WBC  8.9  8.5     HGB  13.2  13.4     HCT  39.6  39.5     PLT  183  177     NA  139  138     K  3.4*  4.2     CL  102  103     CO2  28  24     BUN  8*  8*     CREA  0.52*  0.62     GLU  109*  125*     CA  8.9  9.0     AP  87   --      TP  6.1   --          ALB  3.2*   --               No results for input(s): PH, PCO2, PO2 in the last 72 hours.   No results for input(s): CPK, CKNDX, TROIQ in the last 72 hours.      No lab exists for component: CPKMB   No results found for: PH, PHI, PCO2, PCO2I, PO2, PO2I, HCO3,  HCO3I, FIO2, FIO2I     Lab Results         Component  Value  Date/Time            Color  LIGHT YELLOW (A)  09/24/2020 03:47 PM       Appearance  CLEAR  09/24/2020 03:47 PM       Specific gravity  1.010  09/24/2020 03:47 PM       pH (UA)  7.0  09/24/2020 03:47 PM       Protein  TRACE (A)  09/24/2020 03:47 PM       Glucose  NEGATIVE  09/24/2020 03:47 PM       Ketone  NEGATIVE  09/24/2020 03:47 PM       Bilirubin  NEGATIVE  09/24/2020 03:47 PM       Urobilinogen  0.2  09/24/2020 03:47 PM        Nitrites  NEGATIVE  09/24/2020 03:47 PM       Leukocyte Esterase  NEGATIVE  09/24/2020 03:47 PM       Bacteria  OCCASIONAL  09/06/2020 03:26 PM            WBC  1-4  09/06/2020 03:26 PM        No image results found.        Lab Results         Component  Value  Date/Time            ALT (SGPT)  12  10/25/2020 04:56 AM       AST (SGOT)  35.0 (H)  10/25/2020 04:56 AM       Alk. phosphatase  87  10/25/2020 04:56 AM       Bilirubin, direct  0.7 (H)  10/21/2020 12:52 PM            Bilirubin, total  2.30 (H)  10/25/2020 04:56 AM          All Micro Results                  Procedure  Component  Value  Units  Date/Time           COVID-19 (INPATIENT TESTING) [[id number]]  (Abnormal)  Collected: 10/21/20 1818            Order Status: Completed  Specimen: NASOPHARYNGEAL SWAB  Updated: 10/21/20 2030                COVID-19  POSITIVE                    Comment:  Negative results do not preclude SARS-CoV-2 infection and should not be used as the sole basis   for treatment or other patient management decisions. Negative results must be combined with   clinical observations, patient history, and epidemiological information.      Testing with the Xpert Xpress SARS-CoV-2 test is intended for use by trained operators who are   proficient in performing tests using either GeneXpert Dx, GeneXpert Infinity and/or [employer]. The Xpert Xpress SARS-CoV-2 test is only for use under the Food and Drug   Administration's Emergency Use Authorization.                           CULTURE, URINE [[id number]]              Order Status: No result  Specimen: Urine from Clean catch  No components found for: TROP     Echo Results  (Last 48 hours)             None                     Coag   No results for input(s): PTP, INR, APTT, INREXT, INREXT in the last 72 hours.      Hepatic Function     Recent Labs           10/25/20   0456     TBILI  2.30*     ALT  12     AP  87     TP  6.1        ALB  3.2*              Recent  Glucose Results: No results found for: GLU, GLUPOC, GLUCPOC      Last Point of Care HGB A1C   No results found for: HBA1CPOC      XR CHEST SNGL V      Result Date: 10/24/2020   Indication: Cough, Covid Frontal view chest Compared to 10/22/2020 the heart remains enlarged with tortuous calcified aorta. Mild interstitial thickening seen without consolidation. Advanced joint space narrowing and osteophyte formation seen at the glenohumeral  joints.       IMPRESSION: Persistent cardiomegaly with atherosclerotic disease of aorta. No acute airspace disease. Degenerative joint disease both shoulders.       XR CHEST SNGL V      Result Date: 10/22/2020   Indication: Covid Frontal view chest Compared to 09/06/2020 the heart remains enlarged with calcified aorta. Interstitial thickening seen throughout the lungs with small left pleural effusion but no consolidation. Shallower inspiration. Degenerative joint  space narrowing and osteophyte formation with sclerosis bilateral glenohumeral joints and humeral heads.       IMPRESSION: Shallower inspiration without consolidation. Cardiomegaly. Small left pleural effusion. Atherosclerotic disease of aorta. Degenerative joint disease bilateral shoulders.       XR SHOULDER LT AP/LAT MIN 2 V      Result Date: 10/21/2020   EXAM: XR SHOULDER LT AP/LAT MIN 2 V INDICATION: Left elbow and shoulder pain after fall.  History of rheumatoid arthritis.  COMPARISON: None available. WORKSTATION ID: [id number] TECHNIQUE: 3 views left shoulder obtained in 4 exposures. FINDINGS: No  acute fracture or dislocation. Severe AC joint osteoarthritis. Severe glenohumeral joint osteoarthritis. Imaged lung is clear.       IMPRESSION: No fracture or dislocation.       XR ELBOW LT AP/LAT      Result Date: 10/21/2020   EXAMINATION: XR ELBOW LT AP/LAT INDICATION: injury COMPARISON: No comparison available. WORKSTATION ID: [id number] TECHNIQUE: 2 views left elbow obtained in 4 exposures. FINDINGS: No displaced fracture. No  dislocation. Large collar osteophyte of the  radial head somewhat obscures evaluation. Severe osteoarthritis and capsular heterotopic ossification along the anterior joint capsule. No joint effusion.       IMPRESSION: No acute fracture or dislocation.       XR HIP LT W OR WO PELV 2-3 VWS      Result Date: 10/21/2020   EXAMINATION: XR HIP LT W OR WO PELV 2-3 VWS INDICATION: Left hip pain after fall.  History of hip replacement. COMPARISON: 09/13/2020 WORKSTATION ID: [id number] TECHNIQUE: AP pelvis, frontal and lateral views of the left hip. FINDINGS: Left total hip  arthroplasty with trochanteric claw plate and multiple cerclage wires appears intact. No periprosthetic fracture.  No evidence of loosening. Right total hip arthroplasty appears grossly intact on the frontal view of the pelvis.       IMPRESSION:  No acute osseous abnormality.       MRI LUMB SPINE WO CONT      Result Date: 10/10/2020   INDICATION: Sciatica, right side , back pain, lumbar fracture, TECHNIQUE: Multiplanar, multisequence MRI lumbar spine without IV contrast COMPARISON:Lumbar radiographs dated 09/13/2020 FINDINGS: Study sensitivity is moderately limited due to motion artifact  on several pulse sequences. Vertebrae: There is chronic anterior wedge compression fracture of L1 with approximately 40% height loss. Mild superior endplate compression fracture of L2 with approximately 10% height loss. No edema on the STIR images. Findings  of chronic compression fractures. No evidence of acute compression fracture. Trace retrolisthesis at L1-L2 and L2-L3. Disc Spaces: Advanced disc space narrowing at every lumbar disc level. Exuberant degenerative endplate changes at B7-S2. Multilevel anterior  osteophytes. Conus: Terminates at L1-L2. Soft Tissues: Unremarkable. LEVELS: T12-L1: Disc osteophyte complex. Mild central canal stenosis and moderate right lateral recess stenosis. Moderate ligamentum flavum thickening. Moderate facet arthropathy. Moderate  right and  mild left foraminal stenosis. L1-L2: Disc osteophyte complex. Severe ligamentum flavum thickening. Mild facet arthropathy. Mild bilateral foraminal stenosis. Mild central canal stenosis. L2-L3: Disc osteophyte complex. Moderate ligamentum flavum  thickening. Mild central canal and moderate lateral recess stenosis. Moderate facet arthropathy. Mild bilateral foraminal stenosis. L3-L4: Disc osteophyte complex. Mild ligamentum flavum thickening. Moderate central canal stenosis. Mild facet arthropathy.  Mild bilateral foraminal stenosis. L4-L5: Disc osteophyte complex. Moderate ligamentum flavum thickening. Moderate facet arthropathy. Mild central canal and moderate lateral recess stenosis. Moderate left and mild right foraminal stenosis. L5-S1: Disc  osteophyte complex. Moderate facet arthropathy. Mild central canal stenosis. Moderate facet arthropathy. Mild bilateral foraminal stenosis.       IMPRESSION: 1.  Chronic L1 and L2 compression fractures. 2.  No evidence of acute lumbar compression fracture. 3.  Mild central canal and moderate right lateral recess stenosis at T12-L1. 4.  Mild central canal stenosis at L1-L2 and L5-S1. 5.  Mild central  canal and moderate lateral recess stenosis at L2-L3 and L4-L5. 6.  Moderate central canal stenosis at L3-L4. 7.  Multilevel degenerative changes and foraminal stenoses.       CT HEAD WO CONT      Result Date: 10/25/2020   INDICATION: change in mental status. Altered mental status COMPARISON: 10/21/2020 TECHNIQUE: Axial Head CT without IV contrast. Sagittal and coronal reconstructions were performed. All CT exams at this facility use one or more dose reduction techniques  including automatic exposure control, mA/kV adjustment per patient's size, or iterative reconstruction technique. FINDINGS: There is age related cerebral atrophy with chronic small vessel ischemic changes in the white matter. There is no mass, hemorrhage,  acute ischemia, or hydrocephalus. Atherosclerosis and  dolichoectasia of the vertebral basilar arteries. Calvarium is intact.       IMPRESSION: 1.  No acute intracranial abnormalities. Chronic age-related changes.       CT HEAD WO CONT      Result Date: 10/21/2020   EXAM:  CT SPINE CERV WO CONT, CT HEAD WO CONT INDICATION: Recurrent falls.  History of subdural.  History of rheumatoid arthritis COMPARISON: 2019 TECHNIQUE: CT axial images of the brain and cervical spine without contrast. Coronal and sagittal reconstructed  images were performed. All CT exams at this facility use one or more dose reduction techniques including automatic exposure control, mA/kV adjustment per patient's size, or iterative reconstruction technique.  WORKSTATION ID: [id number] FINDINGS: BRAIN:  No acute intracranial hemorrhage, mass effect, or CT evidence of acute infarct. Mild diffuse parenchymal volume loss. No hydrocephalus. There is subcortical and periventricular hypodensity compatible with gliosis due to nonacute ischemic change. Orbits  are unremarkable.  Paranasal sinuses and mastoid air cells are unremarkable. Calvarium intact. CERVICAL SPINE: No fracture or traumatic subluxation. Multilevel cervical spondylosis noted. Paravertebral soft tissues are unremarkable. Imaged lung apices  are unremarkable.       IMPRESSION: 1. No acute intracranial abnormality. 2. No acute fracture or traumatic subluxation of the cervical spine.       CT HEAD WO CONT      Result Date: 09/27/2020   INDICATION: Delirium. Altered mental status, hallucinations COMPARISON: 08/30/2017 TECHNIQUE: Axial Head CT without IV contrast. Sagittal and coronal reconstructions were performed. All CT exams at this facility use one or more dose reduction techniques  including automatic exposure control, mA/kV adjustment per patient's size, or iterative reconstruction technique. FINDINGS: There is age related cerebral atrophy with chronic small vessel ischemic changes in the white matter. There is no mass, hemorrhage,  acute  ischemia, or hydrocephalus. Calvarium is intact.       IMPRESSION: 1.  No acute intracranial abnormalities. Chronic age-related changes.       CT SPINE CERV WO CONT      Result Date: 10/21/2020   EXAM:  CT SPINE CERV WO CONT, CT HEAD WO CONT INDICATION: Recurrent falls.  History of subdural.  History of rheumatoid arthritis COMPARISON: 2019 TECHNIQUE: CT axial images of the brain and cervical spine without contrast. Coronal and sagittal reconstructed  images were performed. All CT exams at this facility use one or more dose reduction techniques including automatic exposure control, mA/kV adjustment per patient's size, or iterative reconstruction technique. WORKSTATION ID: [id number] FINDINGS: BRAIN:  No acute intracranial hemorrhage, mass effect, or CT evidence of acute infarct. Mild diffuse parenchymal volume loss. No hydrocephalus. There is subcortical and periventricular hypodensity compatible with gliosis due to nonacute ischemic change. Orbits  are unremarkable.  Paranasal sinuses and mastoid air cells are unremarkable. Calvarium intact. CERVICAL SPINE: No fracture or traumatic subluxation. Multilevel cervical spondylosis noted. Paravertebral soft tissues are unremarkable. Imaged lung apices  are unremarkable.       IMPRESSION: 1. No acute intracranial abnormality. 2. No acute fracture or traumatic subluxation of the cervical spine.       [place] ABD LTD      Result Date: 10/22/2020   EXAMINATION: [place] ABD LTD CLINICAL INDICATION: hepatic encephalopathy COMPARISON:  06/12/2013;  . TECHNIQUE: Abdominal ultrasound FINDINGS: Liver 16.5. Prominent periportal echoes. No discrete masses. No intrahepatic biliary duct dilatation. Gallbladder  normal. Negative sonographic Murphy's sign. Common bile duct 6 mm. Pancreatic head and body  unremarkable .  Pancreatic tail   partially obscured and cannot be evaluated. Proximal aorta and IVC Unremarkable. Right kidney 9.9 x 4.9 x 4.0 cm . Isoechoic.  No hydronephrosis.       IMPRESSION:  1.  Prominent periportal intrahepatic hepatic echoes. May be seen in congestion or inflammation. Possible normal variant. 2. Gallbladder normal. 3. No hydronephrosis           Telemetry: normal sinus rhythm        Admitting Diagnosis:          Patient Active Problem List        Diagnosis  Code         ?  Deep venous thrombosis (HCC)  I82.409     ?  Subdural hematoma caused by concussion [facility])  S06.5X9A     ?  Subdural hematoma ([place])  S06.5X9A     ?  Metabolic encephalopathy  Q46.96     ?  Contusion of left hip  S70.02XA     ?  Left elbow contusion  S50.02XA         ?  Multiple falls  R29.6                Assessment :        ??  ileus   ??  COVID-19 infection   ??  Cellulitis of face left   ??  Hepatic encephalopathy   ??  History of subdural hematoma   ??  Chronic liver disease           Plan :        ??  Kub   ??  Continue supportive treatment   ??  Mental status seems to be improving and therefore continue current treatment           General :            Risk of deterioration: moderate            Total time spent : 25 Minutes                    Care Plan discussed with: Patient, Nursing Staff, and >50% of time spent in counseling and coordination of care      Discussed:  Care Plan and D/C Planning      Prophylaxis:  DVT and Pressure ulcers      Disposition:  Home w/Family      Reviewed: Medications, allergies, clinical lab test results and imaging results have been reviewed. Any abnormal findings have been addressed.    Dragon medical dictation software was used for portions of this report. Unintended grammatical errors may occur.      Drusilla Kanner, MD   October 27, 2020

## 2020-10-27 NOTE — Progress Notes (Signed)
 Rapid Response Call Initiated for [name] [name] on 10/27/2020 at 1145.     Situation: RRT initiated by staff for chief complaint of the patient vomiting what looked like stool.  Patient had dark brown emesis on chest and was holding a blue emesis bag that appeared nearly full.  VS stable and patient does not appear to be in any acute distress. Bedside nurse states she just recently gave Zofran and that a KUB was just done.   Patient cleaned and linens changed.  Charge nurse has paged Dr. Musselmani numerous times, including an overhead page.  He has not answered.  KUB shows an ileus.    Background: Patient was admitted ON 10/21/2020 for   Patient Active Problem List   Diagnosis Code    Deep venous thrombosis (HCC) I82.409    Subdural hematoma caused by concussion (HCC) S06.5X9A    Subdural hematoma (HCC) D93.4K0J    Metabolic encephalopathy G93.41    Contusion of left hip S70.02XA    Left elbow contusion S50.02XA    Multiple falls R29.6    and has a history of   Past Medical History:   Diagnosis Date    Arthritis     Deep venous thrombosis (HCC)     Endocrine disease     hypothyroid    Hemochromatosis     Hypertension    . [name] [name] had * No surgery found * on * No surgery found *.     Assessment:     Visit Vitals  BP (!) 161/99   Pulse 81   Temp 98.1 F (36.7 C)   Resp 22   Ht 5' 6 (1.676 m)   Wt 76.2 kg (168 lb)   SpO2 93%   Breastfeeding No   BMI 27.12 kg/m       Recommendations: Labs were not drawn; chest x ray was not performed; EKG was not obtained;     Action Taken: Patient in no acute distress.  Remains on unit.  Nursing supervisor notified that Dr. Kenton not calling back.      **addendum:  Dr. Musselmani called back and order received to insert NGT and place to [facility].

## 2020-10-27 NOTE — Progress Notes (Signed)
RRT called on patient due to vomiting brown liquid, and stomach distention RRT nurse, charge nurse and respiratory responded, vitals assessed patient oxygen level began to drop into the 90s oxygen applied. MD paged awaiting reply back from MD for further instructions will continue to monitor

## 2020-10-27 NOTE — Progress Notes (Signed)
Patient finally agreed to have NG placed. NG placed as ordered and an xray for placement view ordered per protocol.

## 2020-10-27 NOTE — Progress Notes (Signed)
 Chaplain Initial Consultation    Start Visit: 10:10  End Visit: 10:15    Chaplain conducted an initial consultation and Spiritual Assessment for ROXAN YAMAMOTO, who is a 87 y.o.,female. Patient's Primary Language is: [place].   According to the patient's EMR Religious Affiliation is: Catholic.     The reason the Patient came to the hospital is:   Patient Active Problem List    Diagnosis Date Noted    Metabolic encephalopathy 10/21/2020    Contusion of left hip 10/21/2020    Left elbow contusion 10/21/2020    Multiple falls 10/21/2020    Subdural hematoma caused by concussion (HCC) 08/27/2017    Subdural hematoma (HCC) 08/27/2017    Deep venous thrombosis (HCC)         The Chaplain provided the following Interventions:  Initiated a relationship of care and support.  Patient is COVID.  Offered prayer on patients behalf outside doorway.   Chart reviewed.     The following outcomes were achieved:  Patient Eucharist minister visit; prayer.     Assessment:  Patient has no known religious/cultural needs that will affect patient's preferences in health care.     Plan:  Chaplains will continue to follow and will provide pastoral care on an as needed/requested basis.  Chaplain charted for [employer].     Medford Berry [employer]  Pastoral Care  [phone]

## 2020-10-28 ENCOUNTER — Inpatient Hospital Stay: Admit: 2020-10-28 | Payer: MEDICARE | Primary: Internal Medicine

## 2020-10-28 LAB — METABOLIC PANEL, COMPREHENSIVE
ALT (SGPT): <7 U/L — ABNORMAL LOW (ref 10–49)
AST (SGOT): 16 U/L (ref 0.0–33.9)
Albumin: 3.2 gm/dl — ABNORMAL LOW (ref 3.4–5.0)
Alk. phosphatase: 100 U/L (ref 46–116)
Anion gap: 10 mmol/L (ref 5–15)
BUN: 15 mg/dl (ref 9–23)
Bilirubin, total: 2.5 mg/dl — ABNORMAL HIGH (ref 0.30–1.20)
CO2: 27 mEq/L (ref 20–31)
Calcium: 10.2 mg/dl (ref 8.7–10.4)
Chloride: 98 mEq/L (ref 98–107)
Creatinine: 0.71 mg/dl (ref 0.55–1.02)
GFR est AA: >60
GFR est non-AA: >60
Glucose: 178 mg/dl — ABNORMAL HIGH (ref 74–106)
Potassium: 3.7 mEq/L (ref 3.5–5.1)
Protein, total: 6.7 gm/dl (ref 5.7–8.2)
Sodium: 135 mEq/L — ABNORMAL LOW (ref 136–145)

## 2020-10-28 LAB — CBC WITH AUTOMATED DIFF
Anisocytosis: 1
BAND NEUTROPHILS: 3 % (ref 0–11)
EOSINOPHILS: 1 % (ref 0–5)
HCT: 45 % (ref 37.0–50.0)
HGB: 14.9 gm/dl (ref 13.0–17.2)
LYMPHOCYTES: 4 % — ABNORMAL LOW (ref 28–48)
MCH: 34 pg (ref 25.4–34.6)
MCHC: 33.1 gm/dl (ref 30.0–36.0)
MCV: 102.7 fL — ABNORMAL HIGH (ref 80.0–98.0)
MONOCYTES: 4 % (ref 1–13)
MPV: 11.3 fL — ABNORMAL HIGH (ref 6.0–10.0)
Macrocytes: 1
NEUTROPHILS: 88 % — ABNORMAL HIGH (ref 34–64)
NRBC: 0 (ref 0–0)
PLATELET COMMENTS: NORMAL
PLATELET: 244 10*3/uL (ref 140–450)
RBC: 4.38 M/uL (ref 3.60–5.20)
RDW-SD: 52.2 — ABNORMAL HIGH (ref 36.4–46.3)
Smudge cells: 8
WBC: 15 10*3/uL — ABNORMAL HIGH (ref 4.0–11.0)

## 2020-10-28 LAB — MAGNESIUM
Magnesium: 1.8 mg/dL (ref 1.6–2.6)
Magnesium: 1.8 mg/dL (ref 1.6–2.6)

## 2020-10-28 LAB — AMMONIA
Ammonia, plasma: 46 umol/L — ABNORMAL HIGH (ref 11.2–31.7)
Ammonia: 46 umol/L — ABNORMAL HIGH (ref 11.2–31.7)

## 2020-10-28 LAB — C REACTIVE PROTEIN, QT: C-Reactive protein: >91.2 mg/L — ABNORMAL HIGH (ref 0.00–9.90)

## 2020-10-28 LAB — CBC WITH AUTO DIFFERENTIAL
Anisocytosis: 1
Band Neutrophils: 3 % (ref 0–11)
Eosinophils %: 1 % (ref 0–5)
Hematocrit: 45 % (ref 37.0–50.0)
Hemoglobin: 14.9 gm/dl (ref 13.0–17.2)
Lymphocytes %: 4 % — ABNORMAL LOW (ref 28–48)
MACROCYTES, MACROCYT: 1
MCH: 34 pg (ref 25.4–34.6)
MCHC: 33.1 gm/dl (ref 30.0–36.0)
MCV: 102.7 fL — ABNORMAL HIGH (ref 80.0–98.0)
MPV: 11.3 fL — ABNORMAL HIGH (ref 6.0–10.0)
Monocytes %: 4 % (ref 1–13)
Neutrophils %: 88 % — ABNORMAL HIGH (ref 34–64)
Nucleated RBCs: 0 (ref 0–0)
Platelet Comment: NORMAL
Platelets: 244 10*3/uL (ref 140–450)
RBC: 4.38 M/uL (ref 3.60–5.20)
RDW-SD: 52.2 — ABNORMAL HIGH (ref 36.4–46.3)
SMUDGE CELLS: 8
WBC: 15 10*3/uL — ABNORMAL HIGH (ref 4.0–11.0)

## 2020-10-28 LAB — COMPREHENSIVE METABOLIC PANEL
ALT: <7 U/L — ABNORMAL LOW (ref 10–49)
AST: 16 U/L (ref 0.0–33.9)
Albumin: 3.2 gm/dl — ABNORMAL LOW (ref 3.4–5.0)
Alkaline Phosphatase: 100 U/L (ref 46–116)
Anion Gap: 10 mmol/L (ref 5–15)
BUN: 15 mg/dl (ref 9–23)
CO2: 27 mEq/L (ref 20–31)
Calcium: 10.2 mg/dl (ref 8.7–10.4)
Chloride: 98 mEq/L (ref 98–107)
Creatinine: 0.71 mg/dl (ref 0.55–1.02)
EGFR IF NonAfrican American: >60
GFR African American: >60
Glucose: 178 mg/dl — ABNORMAL HIGH (ref 74–106)
Potassium: 3.7 mEq/L (ref 3.5–5.1)
Sodium: 135 mEq/L — ABNORMAL LOW (ref 136–145)
Total Bilirubin: 2.5 mg/dl — ABNORMAL HIGH (ref 0.30–1.20)
Total Protein: 6.7 gm/dl (ref 5.7–8.2)

## 2020-10-28 LAB — C-REACTIVE PROTEIN: CRP: >91.2 mg/L — ABNORMAL HIGH (ref 0.00–9.90)

## 2020-10-28 MED ORDER — LEVOTHYROXINE 20 MCG/ML IV SOLUTION
20 mcg/mL | INTRAVENOUS | Status: DC
Start: 2020-10-28 — End: 2020-10-31
  Administered 2020-10-28 – 2020-10-31 (×4): via INTRAVENOUS

## 2020-10-28 MED ORDER — D5-1/2 NS & POTASSIUM CHLORIDE 20 MEQ/L IV
20 mEq/L | INTRAVENOUS | Status: DC
Start: 2020-10-28 — End: 2020-10-30
  Administered 2020-10-28 – 2020-10-30 (×4): via INTRAVENOUS

## 2020-10-28 MED ORDER — GLYCERIN (ADULT) RECTAL SUPPOSITORY
RECTAL | Status: AC
Start: 2020-10-28 — End: 2020-10-28
  Administered 2020-10-28: 17:00:00 via RECTAL

## 2020-10-28 MED FILL — POTASSIUM CHLORIDE SR 20 MEQ TAB, PARTICLES/CRYSTALS: 20 mEq | ORAL | Qty: 2

## 2020-10-28 MED FILL — LACTULOSE 20 GRAM/30 ML ORAL SOLUTION: 20 gram/30 mL | ORAL | Qty: 30

## 2020-10-28 MED FILL — XIFAXAN 550 MG TABLET: 550 mg | ORAL | Qty: 1

## 2020-10-28 MED FILL — FUROSEMIDE 40 MG TAB: 40 mg | ORAL | Qty: 1

## 2020-10-28 MED FILL — MAGNESIUM OXIDE 400 MG TAB: 400 mg | ORAL | Qty: 1

## 2020-10-28 MED FILL — ENOXAPARIN 40 MG/0.4 ML SUB-Q SYRINGE: 40 mg/0.4 mL | SUBCUTANEOUS | Qty: 0.4

## 2020-10-28 MED FILL — HYDROXYCHLOROQUINE 200 MG TAB: 200 mg | ORAL | Qty: 1

## 2020-10-28 MED FILL — FLEET GLYCERIN (ADULT) RECTAL SUPPOSITORY: RECTAL | Qty: 1

## 2020-10-28 MED FILL — METOPROLOL TARTRATE 25 MG TAB: 25 mg | ORAL | Qty: 1

## 2020-10-28 MED FILL — CEPHALEXIN 500 MG CAP: 500 mg | ORAL | Qty: 1

## 2020-10-28 MED FILL — LEVOTHYROXINE 20 MCG/ML IV SOLUTION: 20 mcg/mL | INTRAVENOUS | Qty: 3.75

## 2020-10-28 MED FILL — LISINOPRIL 20 MG TAB: 20 mg | ORAL | Qty: 2

## 2020-10-28 NOTE — Progress Notes (Signed)
 TRANSFER - OUT REPORT:    Verbal report given to Tami(name) on [name] [name]  being transferred to 2W(unit) for routine progression of care  Need for bed availability on neuro unit.     Report consisted of patient's Situation, Background, Assessment and   Recommendations(SBAR).     Information from the following report(s) SBAR, Kardex, MAR, and Recent Results was reviewed with the receiving nurse.    Lines:   Peripheral IV 10/21/20 Left Antecubital (Active)   Site Assessment Clean, dry, & intact 10/28/20 0945   Phlebitis Assessment 0 10/28/20 0945   Infiltration Assessment 0 10/28/20 0945   Dressing Status Clean, dry, & intact 10/28/20 0945   Dressing Type Transparent 10/28/20 0945   Hub Color/Line Status Blue;Capped 10/28/20 0945   Action Taken Open ports on tubing capped 10/27/20 1400   Alcohol Cap Used Yes 10/28/20 0945        Opportunity for questions and clarification was provided.      Patient transported with:   Patient-specific medications from Pharmacy  Registered Nurse      Called Mr Raylee Strehl at listed #, updated him on pt status and change in bed and unit. Messaged Dr Haydee that pt's son would like an update today from him.     Glasses, luggage bag with clothes, phone transferred with pt.

## 2020-10-28 NOTE — Progress Notes (Signed)
 Chaplain Initial Consultation    Start Visit: 1338  End Visit: 1342    Chaplain conducted an initial consultation and Spiritual Assessment for [name] [name], who is a 87 y.o.,female. Patient's Primary Language is: [place].   According to the patient's EMR Religious Affiliation is: Catholic.     The reason the Patient came to the hospital is:   Patient Active Problem List    Diagnosis Date Noted    Metabolic encephalopathy 10/21/2020    Contusion of left hip 10/21/2020    Left elbow contusion 10/21/2020    Multiple falls 10/21/2020    Subdural hematoma caused by concussion (HCC) 08/27/2017    Subdural hematoma (HCC) 08/27/2017    Deep venous thrombosis (HCC)         The Chaplain provided the following Interventions:  Initiated a relationship of care and support.   Explored issues of faith, belief, spirituality and religious/ritual needs while hospitalized.  Active listening..  Provided information about Pastoral Care Services.  Offered prayer and assurance of continued prayers on patient's behalf.   Chart reviewed.    The following outcomes where achieved:  Patient shared limited information about both their medical narrative and spiritual journey/beliefs.  Patient expressed gratitude for chaplain's visit.    Assessment:  Patient does not have any religious/cultural needs that will affect patient's preferences in health care.  There are no spiritual or religious issues which require intervention at this time.     Plan:  Chaplains will continue to follow and will provide pastoral care on an as needed/requested basis.    Francis JONETTA Georgina Elia  Pastoral Care  [phone]

## 2020-10-28 NOTE — Progress Notes (Signed)
Progress Note             Daily Progress Note: 10/28/2020    Assessment/Plan:        Pt developed vomiting of brownish fluid over the weekend.   KUB shows ileus and increased stool in rectum.   NG placed over the weekend which patient pulled out.        Afebrile.   More confused than Friday, oriented to date, person.   Abdomen mildly distended with BS present RUQ.   Mild tenderness over hernia left of umbilicus, no guarding or grimace.     Hx TAH, subsequent BSO, partial colectomy, C-section x 3, laparoscopy.         Ileus and constipation.   Trial clear liquids.   Maintenance IV fluids.   Try to get in Lactulose and Xifaxin.   Hold other po meds.   Glycerin suppository.   Increase activity if possible.   If vomiting recurs, CT abdomen/pelvis.   Repeat labs.    Acute Covid-19 infection.   No hypoxia.   PCXR 8/9 without infiltrate, small left effusion.   Has received 2 vaccines and one booster.     Multiple falls, 2 within 24 hrs of admission.   PT, OT notes reviewed.   Hx multiple prior orthopedic surgeries and now superimposed encephalopathy, hypothyroidism likely contributing.         - MRI lumbar spine 09/2020: mild to moderate spinal stenosis, moderate foraminal stenosis at multiple levels, and chronic L1 and L2 compression fractures.         - CT C-spine 10/21/20: Multilevel spondylosis.  Metabolic Encephalopathy, hepatic +/- contribution from hypothyroidism and #1.       Hepatic encephalopathy.   History of confusion and hallucinations over the last two months.   Improvement in mental status coinciding with improved ammonia levels after starting  Lactulose but worse confusion over last week or so per son.   Increase Lactulose.    Xifaxan added yesterday.     -  [place]:   Prominent periportal echoes, no mass.     GI consult placed.     Pt had liver bx approximately 17 years ago in evaluation of elevated liver enzymes.   5.    Hx hemochromatosis receiving periodic phlebotomies (~ 2/year) via Dr. Keturah Barre.    6.   RA,  on Methotrexate approximately the last five years, DC'd about a month ago but was still on her pre-admission list of meds.   Continue Plaquenil.   Would have to wait a few weeks for a new DMARD with #1.   (Dr. Kalman Jewels)  7.   Vitamin D deficieny.   Vitamin D level was top nl and replacement DC'd temporarily sec #4.  Eventually resume lower dose replacement.   8.   Hypothyroidism, inadequate replacement, dose increased this admission.    (137mg => 1266m)  9.   Hx subdural hematoma 08/2017, not requiring surgery.  10.   Hypokalemia/hypomagnesemia, replace.  11.   DVT prophylaxis.   Change Heparin to Lovenox SQ.  12.   HTN.   Increase Metoprolol 75 mg BID.   EKG.  Lisinopril increased from 1028mo 40 mg this admission.     13.   Cellulitis face.   Resolving   Change po Keflex.    14.   Rosacea.    Pt may benefit from period of rehab before returning to assisted living.         Subjective:  Review of Systems:  ROS    Objective:   Physical Exam: Physical Exam  Cardiovascular:      Rate and Rhythm: Normal rate and regular rhythm.   Pulmonary:      Effort: Pulmonary effort is normal.      Breath sounds: Normal breath sounds.   Abdominal:      Palpations: Abdomen is soft.      Tenderness: There is no abdominal tenderness.   Neurological:      Mental Status: She is alert.       Visit Vitals  BP (!) 149/90 (BP 1 Location: Right upper arm, BP Patient Position: Supine)   Pulse 77   Temp 98.2 ??F (36.8 ??C)   Resp 21   Ht '5\' 6"'$  (1.676 m)   Wt 76.2 kg (168 lb)   SpO2 93%   Breastfeeding No   BMI 27.12 kg/m??    O2 Flow Rate (L/min): 2 l/min O2 Device: None (Room air)    Temp (24hrs), Avg:98.4 ??F (36.9 ??C), Min:97.3 ??F (36.3 ??C), Max:99.8 ??F (37.7 ??C)    No intake/output data recorded.   08/13 1901 - 08/15 0700  In: -   Out: 200       Data Review:       24 Hour Results:  Recent Results (from the past 24 hour(s))   CULTURE, URINE    Collection Time: 10/27/20  4:19 PM    Specimen: Clean catch; Urine   Result Value Ref Range     Culture Result No Growth To Date     URINALYSIS W/ RFLX MICROSCOPIC    Collection Time: 10/27/20  4:19 PM   Result Value Ref Range    Color AMBER (A) YELLOW,STRAW      Appearance CLEAR CLEAR      Glucose NEGATIVE NEGATIVE,Negative mg/dl    Bilirubin NEGATIVE NEGATIVE,Negative      Ketone NEGATIVE NEGATIVE,Negative mg/dl    Specific gravity >=1.030 1.005 - 1.030      Blood NEGATIVE NEGATIVE,Negative      pH (UA) 6.0 5.0 - 9.0      Protein TRACE (A) NEGATIVE,Negative mg/dl    Urobilinogen 0.2 0.0 - 1.0 mg/dl    Nitrites NEGATIVE NEGATIVE,Negative      Leukocyte Esterase NEGATIVE NEGATIVE,Negative     POC URINE MICROSCOPIC    Collection Time: 10/27/20  4:19 PM   Result Value Ref Range    Epithelial cells, squamous 15-29 /LPF    WBC OCCASIONAL /HPF    RBC OCCASIONAL /HPF    Hyaline cast 1-4 /LPF   CBC WITH AUTOMATED DIFF    Collection Time: 10/27/20  4:58 PM   Result Value Ref Range    WBC 15.0 (H) 4.0 - 11.0 1000/mm3    RBC 4.45 3.60 - 5.20 M/uL    HGB 15.3 13.0 - 17.2 gm/dl    HCT 45.0 37.0 - 50.0 %    MCV 101.1 (H) 80.0 - 98.0 fL    MCH 34.4 25.4 - 34.6 pg    MCHC 34.0 30.0 - 36.0 gm/dl    PLATELET 260 140 - 450 1000/mm3    MPV 11.1 (H) 6.0 - 10.0 fL    RDW-SD 51.7 (H) 36.4 - 46.3      NRBC 0 0 - 0      IMMATURE GRANULOCYTES 0.2 0.0 - 3.0 %    NEUTROPHILS 83.5 (H) 34 - 64 %    LYMPHOCYTES 7.2 (L) 28 - 48 %    MONOCYTES 7.9  1 - 13 %    EOSINOPHILS 0.7 0 - 5 %    BASOPHILS 0.5 0 - 3 %   METABOLIC PANEL, COMPREHENSIVE    Collection Time: 10/27/20  4:58 PM   Result Value Ref Range    Potassium 3.8 3.5 - 5.1 mEq/L    Chloride 98 98 - 107 mEq/L    Sodium 137 136 - 145 mEq/L    CO2 31 20 - 31 mEq/L    Glucose 124 (H) 74 - 106 mg/dl    BUN 15 9 - 23 mg/dl    Creatinine 0.81 0.55 - 1.02 mg/dl    GFR est AA >60.0      GFR est non-AA >60      Calcium 10.4 8.7 - 10.4 mg/dl    Anion gap 8 5 - 15 mmol/L    AST (SGOT) 21.0 0.0 - 33.9 U/L    ALT (SGPT) <7 (L) 10 - 49 U/L    Alk. phosphatase 106 46 - 116 U/L    Bilirubin, total 3.10  (H) 0.30 - 1.20 mg/dl    Protein, total 7.3 5.7 - 8.2 gm/dl    Albumin 3.6 3.4 - 5.0 gm/dl   AMMONIA    Collection Time: 10/27/20  4:58 PM   Result Value Ref Range    Ammonia, plasma 35.0 (H) 11.2 - 31.7 umol/L         Problem List:  Problem List as of 10/28/2020 Never Reviewed            Codes Class Noted - Resolved    Metabolic encephalopathy CHY-85-OY: G93.41  ICD-9-CM: 348.31  10/21/2020 - Present        Contusion of left hip ICD-10-CM: D74.12IN  ICD-9-CM: 924.01  10/21/2020 - Present        Left elbow contusion ICD-10-CM: S50.02XA  ICD-9-CM: 923.11  10/21/2020 - Present        Multiple falls ICD-10-CM: R29.6  ICD-9-CM: V15.88  10/21/2020 - Present        Deep venous thrombosis (HCC) ICD-10-CM: I82.409  ICD-9-CM: 453.40  Unknown - Present        Subdural hematoma caused by concussion [facility]) ICD-10-CM: O67.6H2C  ICD-9-CM: 852.29  08/27/2017 - Present        Subdural hematoma (HCC) ICD-10-CM: N47.0J6G  ICD-9-CM: 432.1  08/27/2017 - Present       Medications reviewed  Current Facility-Administered Medications   Medication Dose Route Frequency    glycerin (adult) suppository 1 Suppository  1 Suppository Rectal NOW    dextrose 5% - 0.45% NaCl with KCl 20 mEq/L infusion  75 mL/hr IntraVENous CONTINUOUS    levothyroxine (SYNTHROID) injection 75 mcg  75 mcg IntraVENous Q24H    lactulose (CHRONULAC) 10 gram/15 mL solution 30 mL  20 g Oral BID    [Held by provider] metoprolol tartrate (LOPRESSOR) tablet 75 mg  75 mg Oral BID    cephALEXin (KEFLEX) capsule 500 mg  500 mg Oral Q6H    [Held by provider] lisinopriL (PRINIVIL, ZESTRIL) tablet 40 mg  40 mg Oral DAILY    rifAXIMin (XIFAXAN) tablet 550 mg  550 mg Oral BID    enoxaparin (LOVENOX) injection 40 mg  40 mg SubCUTAneous Q24H    [Held by provider] levothyroxine (SYNTHROID) tablet 125 mcg  125 mcg Oral 6am    [Held by provider] furosemide (LASIX) tablet 40 mg  40 mg Oral DAILY    albuterol-ipratropium (DUO-NEB) 2.5 MG-0.5 MG/3 ML  3 mL Nebulization Q4H PRN    [  Held by provider]  magnesium oxide (MAG-OX) tablet 400 mg  400 mg Oral DAILY    [Held by provider] melatonin tablet 3 mg  3 mg Oral QHS    [Held by provider] potassium chloride (K-DUR, KLOR-CON M20) SR tablet 40 mEq  40 mEq Oral DAILY    [Held by provider] calcium-vitamin D (OS-CAL +D3) 500 mg-200 unit per tablet 1 Tablet  1 Tablet Oral DAILY WITH LUNCH    [Held by provider] hydrOXYchloroQUINE (PLAQUENIL) tablet 200 mg  200 mg Oral BID    [Held by provider] folic acid (FOLVITE) tablet 1 mg  1 mg Oral DAILY    naloxone (NARCAN) injection 0.1 mg  0.1 mg IntraVENous PRN    acetaminophen (TYLENOL) tablet 650 mg  650 mg Oral Q4H PRN    Or    acetaminophen (TYLENOL) solution 650 mg  650 mg Oral Q4H PRN    Or    acetaminophen (TYLENOL) suppository 650 mg  650 mg Rectal Q4H PRN    [Held by provider] HYDROcodone-acetaminophen (NORCO) 5-325 mg per tablet 1 Tablet  1 Tablet Oral Q6H PRN    ondansetron (ZOFRAN) injection 4 mg  4 mg IntraVENous Q4H PRN       Care Plan discussed with: Patient/Family    Total time spent with patient: 40 minutes.    Colen Darling, MD  October 28, 2020

## 2020-10-28 NOTE — Progress Notes (Signed)
Dr Allena Katz from radiology informed this writer that the NG tube needs to be advanced further by 5cm per xray result. This Clinical research associate acknowledged this Engineer, structural.    Patient pulled out NG tube and is alert to person and place but not time and and situation.    MD notified about situation:  PAGER ID: [phone]  MESSAGE: [id number] PT [name] [name]. DX Metabolic encephalopathy, covid 19 and ileus. Patient needs an NG tube, and has pulled out one I just put in. She is confused and wondering if you want [place] to restraint her? Safo, RN [phone]    Hospitalist called back and asked this writer to call attending MD. This writer not able to reach attending MD on phone. Day shift RN will be notified about this development.

## 2020-10-28 NOTE — Progress Notes (Signed)
 PHYSICAL THERAPY     Patient: [name] [name] (87 y.o. female)  Room: 2215/2215    Date: 10/28/2020  Start Time:  1435  End Time:  1500    Primary Diagnosis: Metabolic encephalopathy [G93.41]  Multiple falls [R29.6]  Contusion of left hip [S70.02XA]  Left elbow contusion [S50.02XA]         Precautions: falls, COVID (+)      Isolation:  Droplet Plus       MDRO: COVID-19     Orders reviewed, chart reviewed, and initial evaluation completed on [name] [name]. Cleared by RN.     ASSESSMENT :  Based on the objective data described below, the patient presents with      - eyes open during PT session, nonsensical responses. Occasional appropriate response  - compliant w PROM therex, able to progress to AROM  - limited B knee flexion  - limited dt cognitive deficits  - difficulty w following commands and staying on task, frequent redirection  Pt require min assist in bed mobility.  Pt need mod assist in sit to stand.  Demo poor standing balance  Pt is high risk of falling, limited mobility due to generalized weakness.  Recommend SNF upon dc for gait transfer, balance and strengthening exs.       Progression toward goals:  []           Improving appropriately and progressing toward goals  []           Improving slowly and progressing toward goals  [x]           Not making progress toward goals    Patient will benefit from skilled intervention to address the above impairments.  Patient's rehabilitation potential is considered to be Fair        PLAN :  Planned Interventions:  Functional mobility training Gait Training Therapeutic exercises Therapeutic activities AD training Patient/caregiver education    Frequency/Duration: Patient will be followed by physical therapy 3x / Week and 5x / Week to address goals.    Recommendations:  Physical Therapy and Occupational Therapy  Discharge Recommendations: SNF  Further Equipment Recommendations for Discharge: TBD        SUBJECTIVE:   Patient agreed to PT    OBJECTIVE DATA SUMMARY:      Patient found: Bed, semirec, Avasyss, bed alarm    Pain Assessment before PT session: None observed and None reported  Pain Assessment after PT session: none observed        COGNITIVE STATUS:     Mental Status:pleasantly confuse  Communication: nonsensical    Follows commands: impaired.  General Cognition: impaired sequencing, slow processing, delayed responses, and impaired safety.      Functional Mobility and Balance Status:   Supine to sit: min assist  Sit to supine: min assist  Sit to stand: mod assist  Stand to sit: mod assist      Therapeutic Exercises:      Lower Extremities:  Supine, Glut Set, Quad Set, Heel Slide, Straight leg raise, Hip ab/duction, Ankle pumps, and Cueing, 10reps, 2 sets        Activity Tolerance:   poor    Final Location:   bed, bed alarm, all needs close, and agrees to call for assistance    COMMUNICATION/EDUCATION:   Education: Patient, benefit of activity while hospitalized, importance of OOB  Barriers to Learning/Limitations: yes;  cognitive    Please refer to care plan and patient education section for further details.    Thank  you for this referral.  Adriana KYM Breed, PT

## 2020-10-29 LAB — SED RATE (ESR): Sed rate (ESR): 80 mm/Hr — ABNORMAL HIGH (ref 0–30)

## 2020-10-29 LAB — CULTURE, URINE
CULTURE RESULT: NO GROWTH
Culture Result: NO GROWTH

## 2020-10-29 LAB — SEDIMENTATION RATE: Sed Rate: 80 mm/Hr — ABNORMAL HIGH (ref 0–30)

## 2020-10-29 MED FILL — D5-1/2 NS & POTASSIUM CHLORIDE 20 MEQ/L IV: 20 mEq/L | INTRAVENOUS | Qty: 1000

## 2020-10-29 MED FILL — LACTULOSE 20 GRAM/30 ML ORAL SOLUTION: 20 gram/30 mL | ORAL | Qty: 30

## 2020-10-29 MED FILL — XIFAXAN 550 MG TABLET: 550 mg | ORAL | Qty: 1

## 2020-10-29 MED FILL — ENOXAPARIN 40 MG/0.4 ML SUB-Q SYRINGE: 40 mg/0.4 mL | SUBCUTANEOUS | Qty: 0.4

## 2020-10-29 MED FILL — LEVOTHYROXINE 20 MCG/ML IV SOLUTION: 20 mcg/mL | INTRAVENOUS | Qty: 3.75

## 2020-10-29 NOTE — Progress Notes (Signed)
Problem: Airway Clearance - Ineffective  Goal: Achieve or maintain patent airway  Outcome: Progressing Towards Goal     Problem: Gas Exchange - Impaired  Goal: Absence of hypoxia  Outcome: Progressing Towards Goal  Goal: Promote optimal lung function  Outcome: Progressing Towards Goal     Problem: Breathing Pattern - Ineffective  Goal: Ability to achieve and maintain a regular respiratory rate  Outcome: Progressing Towards Goal     Problem: Body Temperature -  Risk of, Imbalanced  Goal: Ability to maintain a body temperature within defined limits  Outcome: Progressing Towards Goal  Goal: Will regain or maintain usual level of consciousness  Outcome: Progressing Towards Goal  Goal: Complications related to the disease process, condition or treatment will be avoided or minimized  Outcome: Progressing Towards Goal     Problem: Isolation Precautions - Risk of Spread of Infection  Goal: Prevent transmission of infectious organism to others  Outcome: Progressing Towards Goal     Problem: Nutrition Deficits  Goal: Optimize nutrtional status  Outcome: Progressing Towards Goal     Problem: Risk for Fluid Volume Deficit  Goal: Maintain normal heart rhythm  Outcome: Progressing Towards Goal  Goal: Maintain absence of muscle cramping  Outcome: Progressing Towards Goal  Goal: Maintain normal serum potassium, sodium, calcium, phosphorus, and pH  Outcome: Progressing Towards Goal     Problem: Loneliness or Risk for Loneliness  Goal: Demonstrate positive use of time alone when socialization is not possible  Outcome: Progressing Towards Goal     Problem: Fatigue  Goal: Verbalize increase energy and improved vitality  Outcome: Progressing Towards Goal     Problem: Patient Education: Go to Patient Education Activity  Goal: Patient/Family Education  Outcome: Progressing Towards Goal     Problem: Pressure Injury - Risk of  Goal: *Prevention of pressure injury  Description: Document Braden Scale and appropriate interventions in the  flowsheet.  Outcome: Progressing Towards Goal  Note: Pressure Injury Interventions:  Sensory Interventions: Assess need for specialty bed, Keep linens dry and wrinkle-free, Minimize linen layers, Turn and reposition approx. every two hours (pillows and wedges if needed)    Moisture Interventions: Absorbent underpads, Apply protective barrier, creams and emollients, Internal/External urinary devices, Minimize layers    Activity Interventions: Pressure redistribution bed/mattress(bed type), Assess need for specialty bed    Mobility Interventions: Pressure redistribution bed/mattress (bed type), PT/OT evaluation, Assess need for specialty bed    Nutrition Interventions: Document food/fluid/supplement intake, Offer support with meals,snacks and hydration    Friction and Shear Interventions: Minimize layers, Apply protective barrier, creams and emollients                Problem: Patient Education: Go to Patient Education Activity  Goal: Patient/Family Education  Outcome: Progressing Towards Goal     Problem: Falls - Risk of  Goal: *Absence of Falls  Description: Document Schmid Fall Risk and appropriate interventions in the flowsheet.  Outcome: Progressing Towards Goal  Note: Fall Risk Interventions:  Mobility Interventions: Bed/chair exit alarm, OT consult for ADLs, PT Consult for mobility concerns    Mentation Interventions: More frequent rounding, Reorient patient, Room close to nurse's station    Medication Interventions: Bed/chair exit alarm    Elimination Interventions: Call light in reach, Bed/chair exit alarm, Toileting schedule/hourly rounds    History of Falls Interventions: Bed/chair exit alarm         Problem: Patient Education: Go to Patient Education Activity  Goal: Patient/Family Education  Outcome: Progressing Towards Goal

## 2020-10-29 NOTE — Progress Notes (Signed)
Progress Note             Daily Progress Note: 10/29/2020    Assessment/Plan:        Pt tolerating liquid diet.   Had BMs last night.       Drowsy but arousable this am.   Afebrile.   VSS.  Abdomen distended, bowel sounds present all quadrants.   Mild pain over periumbilical hernia, some bruising.          Hx TAH, subsequent BSO, partial colectomy, C-section x 3, laparoscopy.         Ileus and constipation.   Continue clear liquids and maintenance IV fluids today.   Continue Lactulose and Xifaxin.   Pt limited in activity sec weakness, increased fall risk, encephalopathy.      Repeat KUB in am.     (S/p TAB, subsequent BSO, partial colectomy(diverticular abscess), C-section x 3, laparoscopy.)    Acute Covid-19 infection.   No hypoxia.   PCXR 8/9 without infiltrate, small left effusion.   Has received 2 vaccines and one booster.     Multiple falls, 2 within 24 hrs of admission.   PT, OT notes reviewed.   Hx multiple prior orthopedic surgeries and now superimposed encephalopathy, hypothyroidism likely contributing.         - MRI lumbar spine 09/2020: mild to moderate spinal stenosis, moderate foraminal stenosis at multiple levels, and chronic L1 and L2 compression fractures.         - CT C-spine 10/21/20: Multilevel spondylosis.  Metabolic Encephalopathy, hepatic +/- contribution from hypothyroidism and #1.       Hepatic encephalopathy.   History of confusion and hallucinations over the last two months.   Improvement in mental status coinciding with improved ammonia levels after starting  Lactulose but worse confusion over last week or so per son.   Increase Lactulose.    Xifaxan added yesterday.     -  [place]:   Prominent periportal echoes, no mass.     GI consult placed.     Pt had liver bx approximately 17 years ago in evaluation of elevated liver enzymes.   5.    Hx hemochromatosis receiving periodic phlebotomies (~ 2/year) via Dr. Keturah Barre.    6.   RA, on Methotrexate approximately the last five years, DC'd about a  month ago but was still on her pre-admission list of meds.   Continue Plaquenil.   Would have to wait a few weeks for a new DMARD with #1.   (Dr. Kalman Jewels)  7.   Vitamin D deficieny.   Vitamin D level was top nl and replacement DC'd temporarily sec #4.  Eventually resume lower dose replacement.   8.   Hypothyroidism, inadequate replacement, dose increased this admission.    (164mg => 1237m)  9.   Hx subdural hematoma 08/2017, not requiring surgery.  10.   Hypokalemia/hypomagnesemia, replace.  11.   DVT prophylaxis.   Change Heparin to Lovenox SQ.  12.   HTN.   Increase Metoprolol 75 mg BID.   EKG.  Lisinopril increased from 1091mo 40 mg this admission.     13.   Cellulitis face.   Resolving   Change po Keflex.    14.   Rosacea.    Pt may benefit from period of rehab before returning to assisted living.         Subjective:      Review of Systems:  ROS    Objective:   Physical Exam: Physical Exam  Cardiovascular:      Rate and Rhythm: Normal rate and regular rhythm.   Pulmonary:      Effort: Pulmonary effort is normal.      Breath sounds: Normal breath sounds.   Abdominal:      General: There is distension.      Tenderness: There is abdominal tenderness. There is no guarding or rebound.      Hernia: A hernia is present.   Musculoskeletal:      Right lower leg: No edema.      Left lower leg: No edema.   Neurological:      Mental Status: She is alert.       Visit Vitals  BP 137/86   Pulse 78   Temp 97.7 ??F (36.5 ??C)   Resp 14   Ht '5\' 6"'  (1.676 m)   Wt 76.2 kg (168 lb)   SpO2 96%   Breastfeeding No   BMI 27.12 kg/m??    O2 Flow Rate (L/min): 2 l/min O2 Device: None (Room air)    Temp (24hrs), Avg:97.9 ??F (36.6 ??C), Min:97.7 ??F (36.5 ??C), Max:98.1 ??F (36.7 ??C)    No intake/output data recorded.   08/14 1901 - 08/16 0700  In: 978.8 [P.O.:480; I.V.:498.8]  Out: 500 [Urine:500]      Data Review:       24 Hour Results:  Recent Results (from the past 24 hour(s))   AMMONIA    Collection Time: 10/28/20  6:27 PM   Result Value Ref  Range    Ammonia, plasma 46.0 (H) 11.2 - 31.7 umol/L   CBC WITH AUTOMATED DIFF    Collection Time: 10/28/20  6:27 PM   Result Value Ref Range    WBC 15.0 (H) 4.0 - 11.0 1000/mm3    NEUTROPHILS 88.0 (H) 34 - 64 %    LYMPHOCYTES 4.0 (L) 28 - 48 %    RBC 4.38 3.60 - 5.20 M/uL    MONOCYTES 4.0 1 - 13 %    HGB 14.9 13.0 - 17.2 gm/dl    EOSINOPHILS 1.0 0 - 5 %    HCT 45.0 37.0 - 50.0 %    MCV 102.7 (H) 80.0 - 98.0 fL    BAND NEUTROPHILS 3.0 0 - 11 %    MCH 34.0 25.4 - 34.6 pg    MCHC 33.1 30.0 - 36.0 gm/dl    PLATELET 244 140 - 450 1000/mm3    MPV 11.3 (H) 6.0 - 10.0 fL    RDW-SD 52.2 (H) 36.4 - 46.3      NRBC 0 0 - 0      Anisocytosis 1      Macrocytes 1      Smudge cells 8.0      PLATELET COMMENTS NORMAL     MAGNESIUM    Collection Time: 10/28/20  6:27 PM   Result Value Ref Range    Magnesium 1.8 1.6 - 2.6 mg/dL   METABOLIC PANEL, COMPREHENSIVE    Collection Time: 10/28/20  6:27 PM   Result Value Ref Range    Potassium 3.7 3.5 - 5.1 mEq/L    Chloride 98 98 - 107 mEq/L    Sodium 135 (L) 136 - 145 mEq/L    CO2 27 20 - 31 mEq/L    Glucose 178 (H) 74 - 106 mg/dl    BUN 15 9 - 23 mg/dl    Creatinine 0.71 0.55 - 1.02 mg/dl    GFR est AA >60.0  GFR est non-AA >60      Calcium 10.2 8.7 - 10.4 mg/dl    Anion gap 10 5 - 15 mmol/L    AST (SGOT) 16.0 0.0 - 33.9 U/L    ALT (SGPT) <7 (L) 10 - 49 U/L    Alk. phosphatase 100 46 - 116 U/L    Bilirubin, total 2.50 (H) 0.30 - 1.20 mg/dl    Protein, total 6.7 5.7 - 8.2 gm/dl    Albumin 3.2 (L) 3.4 - 5.0 gm/dl   C REACTIVE PROTEIN, QT    Collection Time: 10/28/20  6:27 PM   Result Value Ref Range    C-Reactive protein >91.20 (H) 0.00 - 9.90 mg/L   SED RATE (ESR)    Collection Time: 10/28/20  8:20 PM   Result Value Ref Range    Sed rate (ESR) 80 (H) 0 - 30 mm/Hr         Problem List:  Problem List as of 10/29/2020 Never Reviewed            Codes Class Noted - Resolved    Metabolic encephalopathy UXL-24-MW: G93.41  ICD-9-CM: 348.31  10/21/2020 - Present        Contusion of left hip  ICD-10-CM: N02.72ZD  ICD-9-CM: 924.01  10/21/2020 - Present        Left elbow contusion ICD-10-CM: S50.02XA  ICD-9-CM: 923.11  10/21/2020 - Present        Multiple falls ICD-10-CM: R29.6  ICD-9-CM: V15.88  10/21/2020 - Present        Deep venous thrombosis (HCC) ICD-10-CM: I82.409  ICD-9-CM: 453.40  Unknown - Present        Subdural hematoma caused by concussion [facility]) ICD-10-CM: G64.4I3K  ICD-9-CM: 852.29  08/27/2017 - Present        Subdural hematoma (HCC) ICD-10-CM: V42.5Z5G  ICD-9-CM: 432.1  08/27/2017 - Present       Medications reviewed  Current Facility-Administered Medications   Medication Dose Route Frequency    dextrose 5% - 0.45% NaCl with KCl 20 mEq/L infusion  75 mL/hr IntraVENous CONTINUOUS    levothyroxine (SYNTHROID) injection 75 mcg  75 mcg IntraVENous Q24H    lactulose (CHRONULAC) 10 gram/15 mL solution 30 mL  20 g Oral BID    [Held by provider] metoprolol tartrate (LOPRESSOR) tablet 75 mg  75 mg Oral BID    [Held by provider] lisinopriL (PRINIVIL, ZESTRIL) tablet 40 mg  40 mg Oral DAILY    rifAXIMin (XIFAXAN) tablet 550 mg  550 mg Oral BID    enoxaparin (LOVENOX) injection 40 mg  40 mg SubCUTAneous Q24H    [Held by provider] levothyroxine (SYNTHROID) tablet 125 mcg  125 mcg Oral 6am    [Held by provider] furosemide (LASIX) tablet 40 mg  40 mg Oral DAILY    albuterol-ipratropium (DUO-NEB) 2.5 MG-0.5 MG/3 ML  3 mL Nebulization Q4H PRN    [Held by provider] magnesium oxide (MAG-OX) tablet 400 mg  400 mg Oral DAILY    [Held by provider] melatonin tablet 3 mg  3 mg Oral QHS    [Held by provider] potassium chloride (K-DUR, KLOR-CON M20) SR tablet 40 mEq  40 mEq Oral DAILY    [Held by provider] calcium-vitamin D (OS-CAL +D3) 500 mg-200 unit per tablet 1 Tablet  1 Tablet Oral DAILY WITH LUNCH    [Held by provider] hydrOXYchloroQUINE (PLAQUENIL) tablet 200 mg  200 mg Oral BID    [Held by provider] folic acid (FOLVITE) tablet 1 mg  1 mg Oral DAILY  naloxone (NARCAN) injection 0.1 mg  0.1 mg IntraVENous PRN     acetaminophen (TYLENOL) tablet 650 mg  650 mg Oral Q4H PRN    Or    acetaminophen (TYLENOL) solution 650 mg  650 mg Oral Q4H PRN    Or    acetaminophen (TYLENOL) suppository 650 mg  650 mg Rectal Q4H PRN    [Held by provider] HYDROcodone-acetaminophen (NORCO) 5-325 mg per tablet 1 Tablet  1 Tablet Oral Q6H PRN    ondansetron (ZOFRAN) injection 4 mg  4 mg IntraVENous Q4H PRN       Care Plan discussed with: Patient/Family    Total time spent with patient: 40 minutes.    Colen Darling, MD  October 29, 2020

## 2020-10-29 NOTE — Progress Notes (Signed)
Progress Notes by Dorise Hiss., PT at 10/29/20 1747                Author: Dorise Hiss., PT  Service: Physical Therapy  Author Type: Physical Therapist       Filed: 10/29/20 1753  Date of Service: 10/29/20 1747  Status: Signed          Editor: Dorise Hiss., PT (Physical Therapist)               PHYSICAL THERAPY RE-EVALUATION/TREATMENT      Patient: [name] [name] (87 y.o. female)   Room: 2215/2215      Date: 10/29/2020   Start Time:  1435   End Time:  1500      Primary Diagnosis: Metabolic encephalopathy [G93.41]   Multiple falls [R29.6]   Contusion of left hip [S70.02XA]   Left elbow contusion [S50.02XA]            Precautions: falls, COVID (+)          Isolation:   Droplet Plus       MDRO: COVID-19        Orders reviewed, chart reviewed, and initial evaluation completed on [name] [name]. Cleared by RN.         ASSESSMENT :   Based on the objective data described below, the patient presents with        - eyes open during PT session, nonsensical responses. Occasional appropriate response   - compliant w PROM therex, able to progress to AROM   - limited B knee flexion   - limited dt cognitive deficits   - difficulty w following commands and staying on task, frequent redirection   Pt require min assist in bed mobility.   Pt need mod assist in sit to stand.   Demo poor standing balance   Pt unable to walk.   Pt is high risk of falling, limited mobility due to generalized weakness.   Recommend SNF upon dc for gait transfer, balance and strengthening exs.          Functional Status Score    Task   Score   1.     Rolling  6 - Use of bed rail or object to pull on      2. Supine to Sit Transfer  4 - Min assist (patient performs 75% or more of the work)      3. Sit to Stand Transfer  3 - Mod assist (patient performs 26-74% of the work)      4. Sitting Edge of Bed  6 - Requires hands to balance      5. Walking  0 - Unable to attempt due to weakness      TOTAL SCORE:  19      INITIAL TOTAL SCORE:                 Progression toward goals:   []           Improving appropriately and progressing  toward goals   []           Improving slowly and progressing toward  goals   [x]            Not making progress toward goals      Patient will benefit from skilled intervention to address the above impairments.   Patients rehabilitation potential is considered to be Fair    GOALS:   PT goals: (1 week)  1. Pt will be independent with bed mobility  in preparation for OOB activities   2. Pt will be able to transfer with supervision in preparation for OOB activities and ambulation.   3. Pt will be able to ambulate a distance of 100 feet using least restrictive device with SBA x 1 to promote functional independence   4. Pt will be supervision with LE exercises x 10-15 reps each to increase strength and endurance   5. Increase strength BLE by 1/2 to1 grade higher to promote functional independence, improve ADLs/mobility and reduce risk for falls   6. Improve standing balance to fair plus to promote upright posture, functional independence, improve ADLs/mobility and reduce risk for falls   7. Tolerate OOB to chair for all meals   8. Patient will demonstrate good activity tolerance during functional activities.    9. Patient will state/observe falls precautions   10. Patient will utilize energy conservation techniques during functional activities   11. Increase FSS score to 25/35  to demonstrate increased independence with functional mobility              PLAN :   Planned Interventions:   Functional mobility training Gait Training Therapeutic exercises Therapeutic activities AD training Patient/caregiver education      Frequency/Duration: Patient will be followed by physical therapy 3x / Week and 5x / Week to address goals.      Recommendations:   Physical Therapy and Occupational Therapy   Discharge Recommendations: SNF   Further Equipment Recommendations for Discharge: TBD                 SUBJECTIVE:     Patient agreed to PT,  reported that she is tired after standing        OBJECTIVE DATA SUMMARY:        Patient found: Bed, semirec, Avasyss, bed alarm      Pain Assessment before PT session: None observed and None reported   Pain Assessment after PT session: none observed              COGNITIVE STATUS:        Mental Status:pleasantly confuse   Communication: nonsensical     Follows commands: impaired.   General Cognition: impaired sequencing, slow processing, delayed responses, and impaired safety.           Functional Mobility and Balance Status:     Supine to sit: min assist   Sit to supine: min assist   Sit to stand: mod assist   Stand to sit: mod assist           Therapeutic Exercises:         Lower Extremities:   Supine, Glut Set, Quad Set, Heel Slide, Straight leg raise, Hip ab/duction, Ankle pumps, and Cueing, 10reps, 2 sets            Activity Tolerance:    poor      Final Location:    bed, bed alarm, all needs close, and agrees to call for assistance        COMMUNICATION/EDUCATION:     Education: Patient, benefit of activity while hospitalized, importance of OOB   Barriers to Learning/Limitations: yes;  cognitive      Please refer to care plan and patient education section for further details.      Thank you for this referral.   Dorise Hiss, PT

## 2020-10-29 NOTE — Progress Notes (Signed)
Problem: Airway Clearance - Ineffective  Goal: Achieve or maintain patent airway  Outcome: Progressing Towards Goal     Problem: Gas Exchange - Impaired  Goal: Absence of hypoxia  Outcome: Progressing Towards Goal     Problem: Pressure Injury - Risk of  Goal: *Prevention of pressure injury  Description: Document Braden Scale and appropriate interventions in the flowsheet.  Outcome: Progressing Towards Goal  Note: Pressure Injury Interventions:  Sensory Interventions: Discuss PT/OT consult with provider, Keep linens dry and wrinkle-free, Maintain/enhance activity level, Minimize linen layers, Monitor skin under medical devices, Pad between skin to skin    Moisture Interventions: Absorbent underpads    Activity Interventions: Pressure redistribution bed/mattress(bed type), PT/OT evaluation    Mobility Interventions: Pressure redistribution bed/mattress (bed type), PT/OT evaluation    Nutrition Interventions: Document food/fluid/supplement intake    Friction and Shear Interventions: Apply protective barrier, creams and emollients, Foam dressings/transparent film/skin sealants

## 2020-10-29 NOTE — Progress Notes (Signed)
NUTRITION RECOMMENDATIONS:   Recommend advance diet as medically able.   Recommend Ensure Clear TID (provides 240 kcals, 8 g protein each).  Daily wts to trend.   Discharge recommendations: pending clinical course    NUTRITION INITIAL EVALUATION    NUTRITION ASSESSMENT:       Reason for Assessment: PUR    Admitting Diagnosis: Metabolic encephalopathy [G93.41]  Multiple falls [R29.6]  Contusion of left hip [S70.02XA]  Left elbow contusion [S50.02XA]     PMH:   Past Medical History:   Diagnosis Date    Arthritis     Deep venous thrombosis (HCC)     Endocrine disease     hypothyroid    Hemochromatosis     Hypertension        Pertinent Medications: D5 1/2NS with KCl @ 75 ml/hr (90 g CHO, 306 kcal), lovenox, lactulose, synthroid (FDI)     Pertinent Labs: 8/15 Na 135 L, t.bili 2.5 H    Anthropometrics:  Height:   Ht Readings from Last 3 Encounters:   10/21/20 5\' 6"  (1.676 m)   08/27/17 5\' 2"  (1.575 m)   05/01/16 5\' 3"  (1.6 m)      Weight:   Wt Readings from Last 10 Encounters:   10/21/20 76.2 kg (168 lb)   08/31/17 76.5 kg (168 lb 10.4 oz)   05/01/16 70.3 kg (155 lb)   07/20/14 70.3 kg (155 lb)       BMI: Body mass index is 27.12 kg/m.    IBW: Ideal body weight: 59.3 kg (130 lb 11.7 oz)           UBW: 168 lbs x 3 years per EMR    Wt Change: Wt stable per EMR.   []  significant   []  not significant [x]  stable   []  intended  []  not intended      GI Symptoms/Issues:  Ileus and constipation per MD  Last Bowel Movement Date: 10/29/20  Abdominal Assessment: Tender  Bowel Sounds: Active   Chewing/Swallowing Issues:  None indicated, previously on Regular diet; on Clears now per MD  Skin Integrity:  Stage 2 PI to buttocks per flowsheets  Fluid Accumulation:  Edema  LLE: 1+  RLE: 1+    Physical Assessment:   Muscle Wasting:  Pt not seen at this time in attempt to reduce unecessary exposure, pt COVID + Fat Wasting:  Pt not seen at this time in attempt to reduce unecessary exposure, pt COVID +     Diet and Intake History:   Current  Diet Order: ADULT DIET Clear Liquid    Food Allergies: shellfish    Diet/Intake History: UTA. Pt under isolation.     Current Appetite/PO Intake: Pt is currently on Clears. Pt previously on cardiac diet. Last meal documented on 8/10 of 26-50%. Provides 859 kcal (52%), 49 g protein (54%) of pt's estimated needs.      Assessment of Current MNT: Diet is appropriate for the time being but is inadequate to meet long-term needs. Advance diet when medically able. Clear Liquid diet provides approximately 721 kcal (43%), 6 g protein (7%).     Estimated Daily Nutrition Needs: 76.2 kg CBW  1676 - 2057 kcals (22-27 kcal/kg)  91 - 114 g protein (1.2-1.5 g/kg)  1905 mL fluid (25 ml/kg or per MD)    NUTRITION DIAGNOSIS:     Inadequate oral intake related to poor appetite/diet restrictions as evidenced by <75% of estimated needs throughout admit x 8 days.    Increased nutrient  needs (kcal, protein) related to metabolic demand for catabolic illness / wound healing  as evidenced by COVID-19, stage 2 PI to buttocks.    NUTRITION INTERVENTION / RECOMMENDATIONS:     Recommend advance diet as medically able.   Recommend Ensure Clear TID (provides 240 kcals, 8 g protein each).  Daily wts to trend.   Discharge recommendations: pending clinical course    NUTRITION MONITORING AND EVALUATION:     Nutrition Level of Care:  []  Low       []  Moderate      [x]  High    Monitoring and Evaluation: Wt trend, PO intake, nutrition-related labs, and s/sx of new skin concerns; will f/u per policy.    Nutrition Goals:  Pt to meet/tolerate >75% of estimated needs, maintain weight throughout LOS, BG control <180, maintain skin integrity.      Oletta Darter, MS, RDN  10/29/20   Pager: [phone]  Office: [phone]

## 2020-10-29 NOTE — Progress Notes (Signed)
 OCCUPATIONAL THERAPY TREATMENT       Patient: [name] [name] (87 y.o. female)  Room: 2215/2215    Primary Diagnosis: Metabolic encephalopathy [G93.41]  Multiple falls [R29.6]  Contusion of left hip [S70.02XA]  Left elbow contusion [S50.02XA]       Date of Admission: 10/21/2020   Length of Stay:  8 day(s)  Insurance: Payor: [facility] / Plan: [facility] MEDICARE A & B / Product Type: Medicare /      Date: 10/29/2020  In time:  1535      Out time:  1550  Total time:  15 mins  Precautions:Falls, COVID +, IV, sacral bandage, remote sitter (+)   Ordered Weight Bearing Status: None    Isolation:  Droplet Plus       MDRO: COVID-19     ASSESSMENT:    Based on the objective data described below, the patient presents with     -pt lethargic  -pt declining OT session secondary to fatigue  -education provided this session  -primary nurse contacted regarding IV, IV leaking fluid and small amount of blood  -attempted bed mobility for pressure relief, pt attempts then refused  affecting patient's ability to safely and independently perform basic ADLs/IADLs.     Patient will benefit from skilled occupational therapy intervention to address the above impairments.     Patient's rehabilitation potential is considered to be Good.     PLAN: 1-5 week X 4 weeks    Recommendations:  Recommend continued skilled occupational therapy intervention to address above impairments.  Recommend out of bed activity to counteract ill effects of bedrest, with assistance from staff as needed.    Discharge Recommendations: SNF vs. Outpatient OT/PT  Further Equipment Recommendations for Discharge: no needs anticipated; has DME available at home     Education/ communication:     Barriers to Learning/Limitations:  None  Education provided to: patient on (+) role of OT, (+) OT plan of care, (+) Instructed patient in the benefits of maintaining activity tolerance, functional mobility, and independence with self care tasks during acute stay  to ensure safe return  home and to baseline. Encouraged patient to increase frequency and duration OOB, be out of bed for all meals, perform daily ADLs (as approved by RN/MD regarding bathing etc), and performing functional mobility to/from bathroom with staff assistance as needed., (+) instructed patient on the importance of activity while hospitalized to prevent a decline in function, (+) encouraged patient to sit up in chair for 45 (+) minutes or as tolerated 2-3 times a day, with staff assistance as needed, (+) the importance of maintaining UE muscle strength and activity tolerance while hospitalized to prevent a decline in function, (+) staff assistance with mobility, (+) change positions frequently, (+) functional mobility, (+) safety, (+) energy conservation techniques  Educational Handouts issued: none this session  Patient / Family readiness to learn indicated by: verbalized understanding    SUBJECTIVE:   Patient I can't, I'm sorry.    OBJECTIVE DATA SUMMARY:   Orders, labs, occupational therapy and chart reviewed on [name] [name]. Communicated with nursing staff. Patient cleared to participate in Occupational Therapy treatment.    Most recent value for oxygen in flow sheets:  O2 Device: None (Room air) (10/29/20 1210)  O2 Flow Rate (L/min): 2 l/min (10/27/20 1148)     Patient found: supine in bed, IV (+), IV leaking-primary nurse and nurses station notified, purewick(+)    Pain assessment: no pain    Cognitive:   Mental  status:   Orientation: Patient is oriented to person, lethargic  Communication: soft spoken  Attention Span:  poor this date  Follows commands: unable this date d/t fatigue  Safety/Judgement: needs cueing for safety and precautions  General Cognition: slow processing and delayed responses.  Hearing: grossly intact.  Vision:  grossly intact.    Activities of Daily Living:  Pt agrees to bathing and oral care then declined    Mobility:  Pt makes attempt with shifting BLE, unsuccessful d/t fatigue, pt declined  further mobility  Repositioned in bed for comfort with Total A  Transfers:  None this session    Balance:  Unable to determine    Activity Tolerance:   poor    Therapeutic Exercises: none this session    Final Location: Primary nurse present, supine in bed HOB elevated, bed al alarm (+), remote sitter (+)    Olam Gaskins, OTA  October 29, 2020

## 2020-10-30 ENCOUNTER — Inpatient Hospital Stay: Admit: 2020-10-30 | Payer: MEDICARE | Primary: Internal Medicine

## 2020-10-30 MED ORDER — IOPAMIDOL 61 % IV SOLN
30061 mg iodine /mL (61 %) | Freq: Once | INTRAVENOUS | Status: AC
Start: 2020-10-30 — End: 2020-10-30
  Administered 2020-10-30: via INTRAVENOUS

## 2020-10-30 MED FILL — LACTULOSE 20 GRAM/30 ML ORAL SOLUTION: 20 gram/30 mL | ORAL | Qty: 30

## 2020-10-30 MED FILL — ISOVUE-300  61 % INTRAVENOUS SOLUTION: 300 mg iodine /mL (61 %) | INTRAVENOUS | Qty: 80

## 2020-10-30 MED FILL — LEVOTHYROXINE 20 MCG/ML IV SOLUTION: 20 mcg/mL | INTRAVENOUS | Qty: 3.75

## 2020-10-30 MED FILL — ENOXAPARIN 40 MG/0.4 ML SUB-Q SYRINGE: 40 mg/0.4 mL | SUBCUTANEOUS | Qty: 0.4

## 2020-10-30 MED FILL — HYDROXYCHLOROQUINE 200 MG TAB: 200 mg | ORAL | Qty: 1

## 2020-10-30 MED FILL — XIFAXAN 550 MG TABLET: 550 mg | ORAL | Qty: 1

## 2020-10-30 MED FILL — ONDANSETRON (PF) 4 MG/2 ML INJECTION: 4 mg/2 mL | INTRAMUSCULAR | Qty: 2

## 2020-10-30 NOTE — Progress Notes (Signed)
Called cat scan lab, patient had drunk 3/4 of the contrast media and unable to drink all of it. Debra in radiology said they will send for the patient at 1845 tonight.

## 2020-10-30 NOTE — Progress Notes (Signed)
Acute Care Surgery Brief Progress Note     CT scan reviewed.  Patient appears to have a very small portion of bowel within the hernia but the majority of this is incarcerated fat.  The bowel portion is obviously not at risk as contrast passes through this area into the colon without issue.  The patient has skin changes at the hernia however and this likely should be fixed in a semiurgent fashion.  I will discuss this with Dr. [name] tomorrow regarding the patient's surgical risk given her multiple medical comorbidities.  Hopefully we can get the patient passed her acute COVID infection and acute hospitalization to consider a semiurgent semielective hernia repair several weeks down the road.  The patient is also incidentally noted to have a very distended bladder on imaging.  I have placed an order for a Foley catheter.  I suspect this will also help her abdominal pressure and may reduce the pressure on the hernia.    Brown Human, MD, FACS  Acute Care Surgeon, [facility]  Office number [phone]  Pager [phone]

## 2020-10-30 NOTE — Consults (Signed)
Consults by  Elveria RisingPainter, Mclane Arora, MD at 10/30/20 [id number]                Author: Elveria RisingPainter, Jemarion Roycroft, MD  Service: SURGERY  Author Type: Physician       Filed: 10/30/20 1923  Date of Service: 10/30/20 1917  Status: Signed          Editor: Elveria RisingPainter, Thaniel Coluccio, MD (Physician)            Consult Orders        1. IP CONSULT TO PHYSICIAN [[id number]] ordered by Blenda BridegroomMauser, Carston Riedl L, MD at 10/30/20 1021                                      [facility]   ACUTE CARE SURGERY          CONSULT NOTE             Patient: [name] [name]  Age: 87 y.o.  Sex: female          Date of Birth: [birth year]  Admit Date: 10/21/2020  Admit Doctor: Blenda Bridegroomhomas L Mauser, MD         MRN: [id number]   CSN: [id number]   PCP: Blenda BridegroomMauser, Jesslyn Viglione L, MD        CHIEF COMPLAINT:      Chief Complaint       Patient presents with        ?  Fall        ?  Hip Pain                 ASSESSMENT:        Active Problems:     Metabolic encephalopathy (10/21/2020)        Contusion of left hip (10/21/2020)        Left elbow contusion (10/21/2020)        Multiple falls (10/21/2020)             [name] [name] is a 87 y.o. female who has concern for bowel obstruction with what appears to be an incarcerated periumbilical hernia possibly with bowel within the hernia.  At the time of my evaluation she was still pending axial imaging for further  evaluation.           PLAN:        Follow-up CT to evaluate source of possible bowel obstruction which is likely incarcerated bowel within periumbilical hernia.  Likely will need emergency surgery for recurrent ventral hernia repair with possible bowel resection.      Thank you for allowing us to participate in the care of your patient.      For questions or concerns please contact: Acute Care Surgery Team by dedicated  pager: [phone]           HPI:        [name] [name] is a 87 y.o. female with a hx of hypertension, rheumatoid arthritis, hemochromatosis who presented to the ED with fall at her adult care facility.  She was apparently  confused on arrival and  diagnosed with multifactorial encephalopathy.  Over the last few days she is apparently developed nausea and vomiting along with recurrent confusion.  She had KUBs but has not had axial imaging yet.  Patient reports that she has pain at the periumbilical  area where she has a hernia.  She reports the pain is constant, pressure-like, nonradiating, worse with  touching and better when left alone, moderate to severe in intensity.             It was in this setting that our Acute Care Surgery Team was consulted.           Past Medical History:        Diagnosis  Date         ?  Arthritis       ?  Deep venous thrombosis (HCC)       ?  Endocrine disease            hypothyroid         ?  Hemochromatosis           ?  Hypertension                Past Surgical History:         Procedure  Laterality  Date          ?  HX ORTHOPAEDIC              left knee replacement             Social History          Tobacco Use         ?  Smoking status:  Never     ?  Smokeless tobacco:  Never       Substance Use Topics         ?  Alcohol use:  No            History reviewed. No pertinent family history.         Allergies        Allergen  Reactions         ?  Shellfish Derived  Hives         ?  Sulfa (Sulfonamide Antibiotics)  Hives              Prior to Admission medications             Medication  Sig  Start Date  End Date  Taking?  Authorizing Provider            alendronate (FOSAMAX) 70 mg tablet  Take 70 mg by mouth every seven (7) days.  10/26/20    Yes  Provider, Historical            furosemide (Lasix) 40 mg tablet  Take 40 mg by mouth every Monday, Wednesday, Friday. In afternoon  10/23/20    Yes  Provider, Historical     lactulose (CHRONULAC) 10 gram/15 mL solution  Take 10 g by mouth in the morning.      Yes  Provider, Historical     magnesium oxide (MAG-OX) 400 mg tablet  Take 400 mg by mouth in the morning.      Yes  Provider, Historical     melatonin 3 mg tablet  Take  by mouth nightly.      Yes  Provider,  Historical     potassium chloride (K-DUR, KLOR-CON M20) 20 mEq tablet  Take 40 mEq by mouth in the morning.      Yes  Provider, Historical     albuterol-ipratropium (DUO-NEB) 2.5 mg-0.5 mg/3 ml nebu  3 mL by Nebulization route every four (4) hours as needed (sob).  08/31/17      Musselmani, Cleon Gustin, MD     methotrexate (RHEUMATREX) 2.5 mg tablet  Take 12.5 mg by mouth  every Tuesday. Methotrexate 2.5 mg tab, take 5 tabs po q week  Q tuesday   Patient not taking: Reported on 10/21/2020        Provider, Historical     hydroxychloroquine (PLAQUENIL) 200 mg tablet  Take 200 mg by mouth two (2) times a day.        Provider, Historical            furosemide (LASIX) 40 mg tablet  Take 40 mg by mouth daily.        Provider, Historical            metoprolol tartrate (LOPRESSOR) 50 mg tablet  Take 50 mg by mouth two (2) times a day.        Provider, Historical     folic acid (FOLVITE) 1 mg tablet  Take 1 mg by mouth daily.        Provider, Historical     potassium chloride SR (KLOR-CON 10) 10 mEq tablet  Take 10 mEq by mouth daily.   Patient not taking: Reported on 10/21/2020        Provider, Historical     calcium-cholecalciferol, d3, (CALCIUM 600 + D) 600-125 mg-unit tab  Take 1 Tab by mouth daily.        Provider, Historical     lisinopril (PRINIVIL, ZESTRIL) 10 mg tablet  Take 10 mg by mouth daily.        Other, Phys, MD     predniSONE (DELTASONE) 10 mg tablet  Take  by mouth daily (with breakfast).   Patient not taking: Reported on 10/21/2020        Other, Phys, MD            levothyroxine (SYNTHROID) 125 mcg tablet  Take 125 mcg by mouth Daily (before breakfast).        Other, Phys, MD                REVIEW OF SYSTEMS:         CONSTITUTIONAL: Denies fevers, chills, weight loss   HEENT: Denies headaches, changes in vision or hearing, rhinorrhea, nasal congestion, sore throat   CVS: Denies chest pain or palpitations   RESP: Denies coughing, wheezing, dyspnea    GI: Denies melena, hematochezia, hematemesis, changes in bowel  habits    NEURO: Denies numbness, tingling or weakness in extremities   PSYCH: Denies anxiety or depression  MUSCULOSKLETAL: Denies joint pain or swelling, myalgias   GU: Denies hematuria or dysuria  SKIN: Denies rash or pruritis           PHYSICAL EXAM:        Vital Signs: Visit Vitals      BP  125/73 (BP 1 Location: Right upper arm, BP Patient Position: Supine)     Pulse  93     Temp  98.1 ??F (36.7 ??C)     Resp  15     Ht  5\' 6"  (1.676 m)     Wt  76 kg (167 lb 8.8 oz)     SpO2  97%     Breastfeeding  No        BMI  27.04 kg/m??           GENERAL:  patient resting in bed in NAD, appears age stated, cooperative    HEENT:  sclera anicteric, normocephalic, atraumatic, EOMI    NECK:  no appreciable JVD    CHEST: RRR    LUNGS:  No increased work of breathing.  ABD:  Soft, mildly distended, lower midline incision with periumbilical hernia with overlying erythema and ecchymoses, tender to palpation, nonreducible.  No guarding or rigidity. No appreciable organomegaly.  No dullness.      EXT:  Warm.  No clubbing.  No cyanosis, swelling or edema.     SKIN:  No rash    NEURO:  awake, alert and oriented.  Moves all 4 extremities.    PSYCH:  normal affect            Data Review:        Labs:    No results found for this or any previous visit (from the past 12 hour(s)).         Imaging:  (Independently reviewed)   XR ABD (KUB)      Result Date: 10/30/2020   Clinical history: Ileus EXAMINATION: Single view of the abdomen 10/30/2020 Correlation: 10/27/2020 FINDINGS: NG tube has been removed, stomach is underdistended. There are multiple dilated loops of small bowel left abdomen measuring up to 5.9 cm. Decrease  in number of dilated loops of small bowel although dilatation of small bowel left upper quadrant has increased. Bowel loops in the right abdomen and pelvis are underdistended. Scattered air is noted in the transverse, descending colon and rectosigmoid.  Bilateral hip prosthesis. Degenerative changes of the spine.        IMPRESSION: Findings suggestive of ileus although bowel obstruction cannot entirely be excluded.        I personally reviewed the labs and imaging and my findings are as follows: No labs for the last 48 hours.  KUB demonstrating dilated loops of bowel concerning for ileus versus obstruction.      Brown Human, MD, [facility]   Acute Care Surgery Team   [facility]   [address], [address]   [place], [place]  [address]   [phone]         10/30/2020   7:18 PM      For questions or concerns please contact the Acute Care Surgery Team  by dedicated pager: [phone]

## 2020-10-30 NOTE — Progress Notes (Signed)
Progress Note Template    Patient: [name] [name]               Sex: female          DOA: 10/21/2020       Date of Birth:  1933-08-31      Age:  87 y.o.                Current Problems and Procedures:   Problem List:  Active Problems:    Metabolic encephalopathy (10/21/2020)      Contusion of left hip (10/21/2020)      Left elbow contusion (10/21/2020)      Multiple falls (10/21/2020)        Assessment and Plan:   Addendum:   CT results reviewed and received call from radiology.   Surgery notes reviewed and appreciate assistance.       I've made patient NPO again except for her meds for hepatic encephalopathy in the short term, IV changed to NS w KCl with hyponatremia.       She is a moderate surgical risk with factors including advanced age, somewhat diminished functional status with limited mobility due to RA over last several years, cirrhosis with encephalopathy and degree of auto-anticoagulation (INR 1.4 on admission).   Generally weaker with this admission and poorer nutritional status.   (Albumin 1.5)     Echo from 12/2016 showed diastolic dysfunction and EF 55%.   Asymptomatic for chest pain/DOE albeit limited ability to achieve higher mets.            Repeat EKG     CT chest in near future to assess pulmonary nodules further.   Per son, she quit smoking over 40 years ago.     Discussed with patient's son this evening.              DC Plan:   Discharge: Other      [name], MD  October 30, 2020

## 2020-10-30 NOTE — Progress Notes (Signed)
Progress Note             Daily Progress Note: 10/30/2020    Assessment/Plan:        Pt tolerating liquid diet without nausea/vomiting.   Had BMs 2 nights ago.   Mild tenderness over periumbilical hernia, still with some ecchymosis of skin.        KUB with improvement in extent of distention but more pronounced distention left abdomen.     Afebrile.   More alert this am, oriented to place, month, year.   Some difficulty in recalling details.   Denies SOB, chest pain.     Abdomen somewhat distended, BS x 4, still tender over periumbilical hernia.       Hesitant to advance diet.   CT abd/pelvis ordered.   Consult placed to surgery for assistance in evaluation.          Elevated ESR, CRP may be partly due to her underlying RA.       Discussed with patient's son per telephone in fair detail re: condition/evaluations.              Ileus, hernia, and constipation.   Continue clear liquids and maintenance IV fluids today.   Has developed mild crackles, decrease IV rate 50 cc/hr.   Continue Lactulose and Xifaxin.   Pt limited in activity sec weakness, increased fall risk, encephalopathy.       CT abd/pelvis      Surgical consultation.     (S/p TAB, subsequent BSO, partial colectomy(diverticular abscess), C-section x 3, laparoscopy.)    Acute Covid-19 infection.   Essentially asymptomatic other than mild cough.   No hypoxia.   PCXR 8/9 without infiltrate, small left effusion.   Has received 2 vaccines and one booster.     Multiple falls, 2 within 24 hrs of admission.   PT, OT notes reviewed.   Hx multiple prior orthopedic surgeries and now superimposed encephalopathy, hypothyroidism likely contributing.         - MRI lumbar spine 09/2020: mild to moderate spinal stenosis, moderate foraminal stenosis at multiple levels, and chronic L1 and L2 compression fractures.         - CT C-spine 10/21/20: Multilevel spondylosis.  Metabolic Encephalopathy, hepatic +/- contribution from hypothyroidism and #1.       Hepatic encephalopathy.    History of confusion and hallucinations over the last two months.   Improvement in mental status coinciding with improved ammonia levels after starting  Lactulose but worse confusion over last week or so per son.   Increase Lactulose.    Xifaxan added yesterday.   Cirrhosis likely sec hemochromatosis and Methotrexate over several years.     -  [place]:   Prominent periportal echoes, no mass.     GI consult appreciated.     (Pt had liver bx approximately 17 years ago in evaluation of elevated liver enzymes.)   5.    Hx hemochromatosis receiving periodic phlebotomies (~ 2/year) via Dr. Keturah Barre.    6.   RA, on Methotrexate approximately the last five years, DC'd about a month ago but was still on her pre-admission list of meds.   Continue Plaquenil.   Would have to wait a few weeks for a new DMARD with #1.   (Dr. Kalman Jewels)  7.   Vitamin D deficieny.   Vitamin D level was top nl and replacement DC'd temporarily sec #4.  Eventually resume lower dose replacement.   8.   Hypothyroidism, inadequate replacement, dose increased  this admission.    (178mg => 1220m)  9.   Hx subdural hematoma 08/2017, not requiring surgery.  10.   Hypokalemia/hypomagnesemia, replace.  11.   DVT prophylaxis.   Change Heparin to Lovenox SQ.  12.   HTN.   Currently normotensive off meds sine this weekend.  13.   Cellulitis face.   Resolved.    14.   Rosacea.    Pt may benefit from period of rehab before returning to assisted living.         Subjective:      Review of Systems:  ROS    Objective:   Physical Exam: Physical Exam  Cardiovascular:      Rate and Rhythm: Normal rate and regular rhythm.   Pulmonary:      Effort: Pulmonary effort is normal.      Breath sounds: Rales present. No wheezing.   Abdominal:      General: There is distension.      Tenderness: There is abdominal tenderness. There is no guarding or rebound.      Hernia: A hernia is present.   Musculoskeletal:      Right lower leg: No edema.      Left lower leg: No edema.   Neurological:       Mental Status: She is alert.       Visit Vitals  BP 135/74 (BP 1 Location: Right upper arm, BP Patient Position: Supine)   Pulse 86   Temp 98.1 ??F (36.7 ??C)   Resp 13   Ht '5\' 6"'  (1.676 m)   Wt 76 kg (167 lb 8.8 oz)   SpO2 94%   Breastfeeding No   BMI 27.04 kg/m??    O2 Flow Rate (L/min): 2 l/min O2 Device: None (Room air)    Temp (24hrs), Avg:97.7 ??F (36.5 ??C), Min:96.6 ??F (35.9 ??C), Max:98.1 ??F (36.7 ??C)    No intake/output data recorded.   08/15 1901 - 08/17 0700  In: 240 [P.O.:240]  Out: 400 [Urine:400]      Data Review:       24 Hour Results:  No results found for this or any previous visit (from the past 24 hour(s)).        Problem List:  Problem List as of 10/30/2020 Never Reviewed            Codes Class Noted - Resolved    Metabolic encephalopathy [id number]  ICD-9-CM: 348.31  10/21/2020 - Present        Contusion of left hip ICD-10-CM: S70.02XA  ICD-9-CM: 924.01  10/21/2020 - Present        Left elbow contusion ICD-10-CM: S50.02XA  ICD-9-CM: 923.11  10/21/2020 - Present        Multiple falls ICD-10-CM: R29.6  ICD-9-CM: V15.88  10/21/2020 - Present        Deep venous thrombosis (HCDennisonICD-10-CM: I82.409  ICD-9-CM: 453.40  Unknown - Present        Subdural hematoma caused by concussion ([facility] CenterICD-10-CM: S0Z66.0Y3KICD-9-CM: 852.29  08/27/2017 - Present        Subdural hematoma (HCFlorissantICD-10-CM: S0Z60.1U9NICD-9-CM: 432.1  08/27/2017 - Present       Medications reviewed  Current Facility-Administered Medications   Medication Dose Route Frequency    dextrose 5% - 0.45% NaCl with KCl 20 mEq/L infusion  50 mL/hr IntraVENous CONTINUOUS    levothyroxine (SYNTHROID) injection 75 mcg  75 mcg IntraVENous Q24H    lactulose (CHRONULAC) 10 gram/15 mL solution 30 mL  20 g Oral BID    [Held by provider] metoprolol tartrate (LOPRESSOR) tablet 75 mg  75 mg Oral BID    [Held by provider] lisinopriL (PRINIVIL, ZESTRIL) tablet 40 mg  40 mg Oral DAILY    rifAXIMin (XIFAXAN) tablet 550 mg  550 mg Oral BID    enoxaparin (LOVENOX) injection  40 mg  40 mg SubCUTAneous Q24H    [Held by provider] levothyroxine (SYNTHROID) tablet 125 mcg  125 mcg Oral 6am    [Held by provider] furosemide (LASIX) tablet 40 mg  40 mg Oral DAILY    albuterol-ipratropium (DUO-NEB) 2.5 MG-0.5 MG/3 ML  3 mL Nebulization Q4H PRN    [Held by provider] magnesium oxide (MAG-OX) tablet 400 mg  400 mg Oral DAILY    [Held by provider] melatonin tablet 3 mg  3 mg Oral QHS    [Held by provider] potassium chloride (K-DUR, KLOR-CON M20) SR tablet 40 mEq  40 mEq Oral DAILY    [Held by provider] calcium-vitamin D (OS-CAL +D3) 500 mg-200 unit per tablet 1 Tablet  1 Tablet Oral DAILY WITH LUNCH    hydrOXYchloroQUINE (PLAQUENIL) tablet 200 mg  200 mg Oral BID    [Held by provider] folic acid (FOLVITE) tablet 1 mg  1 mg Oral DAILY    naloxone (NARCAN) injection 0.1 mg  0.1 mg IntraVENous PRN    acetaminophen (TYLENOL) tablet 650 mg  650 mg Oral Q4H PRN    Or    acetaminophen (TYLENOL) solution 650 mg  650 mg Oral Q4H PRN    Or    acetaminophen (TYLENOL) suppository 650 mg  650 mg Rectal Q4H PRN    [Held by provider] HYDROcodone-acetaminophen (NORCO) 5-325 mg per tablet 1 Tablet  1 Tablet Oral Q6H PRN    ondansetron (ZOFRAN) injection 4 mg  4 mg IntraVENous Q4H PRN       Care Plan discussed with: Patient/Family    Total time spent with patient: 40 minutes.    Colen Darling, MD  October 30, 2020

## 2020-10-30 NOTE — Progress Notes (Signed)
Problem: Airway Clearance - Ineffective  Goal: Achieve or maintain patent airway  Outcome: Progressing Towards Goal     Problem: Gas Exchange - Impaired  Goal: Absence of hypoxia  Outcome: Progressing Towards Goal  Goal: Promote optimal lung function  Outcome: Progressing Towards Goal     Problem: Breathing Pattern - Ineffective  Goal: Ability to achieve and maintain a regular respiratory rate  Outcome: Progressing Towards Goal     Problem: Body Temperature -  Risk of, Imbalanced  Goal: Ability to maintain a body temperature within defined limits  Outcome: Progressing Towards Goal  Goal: Will regain or maintain usual level of consciousness  Outcome: Progressing Towards Goal  Goal: Complications related to the disease process, condition or treatment will be avoided or minimized  Outcome: Progressing Towards Goal     Problem: Isolation Precautions - Risk of Spread of Infection  Goal: Prevent transmission of infectious organism to others  Outcome: Progressing Towards Goal     Problem: Nutrition Deficits  Goal: Optimize nutrtional status  Outcome: Progressing Towards Goal     Problem: Risk for Fluid Volume Deficit  Goal: Maintain normal heart rhythm  Outcome: Progressing Towards Goal  Goal: Maintain absence of muscle cramping  Outcome: Progressing Towards Goal  Goal: Maintain normal serum potassium, sodium, calcium, phosphorus, and pH  Outcome: Progressing Towards Goal     Problem: Loneliness or Risk for Loneliness  Goal: Demonstrate positive use of time alone when socialization is not possible  Outcome: Progressing Towards Goal     Problem: Fatigue  Goal: Verbalize increase energy and improved vitality  Outcome: Progressing Towards Goal     Problem: Patient Education: Go to Patient Education Activity  Goal: Patient/Family Education  Outcome: Progressing Towards Goal     Problem: Pressure Injury - Risk of  Goal: *Prevention of pressure injury  Description: Document Braden Scale and appropriate interventions in the  flowsheet.  Outcome: Progressing Towards Goal  Note: Pressure Injury Interventions:  Sensory Interventions: Assess changes in LOC, Discuss PT/OT consult with provider, Float heels, Keep linens dry and wrinkle-free, Minimize linen layers, Turn and reposition approx. every two hours (pillows and wedges if needed)    Moisture Interventions: Absorbent underpads, Apply protective barrier, creams and emollients, Internal/External urinary devices, Minimize layers    Activity Interventions: Pressure redistribution bed/mattress(bed type)    Mobility Interventions: Pressure redistribution bed/mattress (bed type), PT/OT evaluation    Nutrition Interventions: Document food/fluid/supplement intake    Friction and Shear Interventions: Apply protective barrier, creams and emollients, Minimize layers                Problem: Patient Education: Go to Patient Education Activity  Goal: Patient/Family Education  Outcome: Progressing Towards Goal     Problem: Falls - Risk of  Goal: *Absence of Falls  Description: Document Schmid Fall Risk and appropriate interventions in the flowsheet.  Outcome: Progressing Towards Goal     Problem: Patient Education: Go to Patient Education Activity  Goal: Patient/Family Education  Outcome: Progressing Towards Goal

## 2020-10-31 LAB — EKG, 12 LEAD, INITIAL
Atrial Rate: 220 {beats}/min
Calculated R Axis: -80 degrees
Calculated T Axis: -156 degrees
Q-T Interval: 388 ms
QRS Duration: 88 ms
QTC Calculation (Bezet): 458 ms
Ventricular Rate: 84 {beats}/min

## 2020-10-31 LAB — URINALYSIS W/ RFLX MICROSCOPIC
Bilirubin, Urine: NEGATIVE
Bilirubin: NEGATIVE
Blood, Urine: NEGATIVE
Blood: NEGATIVE
Glucose, Ur: NEGATIVE mg/dl
Glucose: NEGATIVE mg/dl
Ketone: NEGATIVE mg/dl
Ketones, Urine: NEGATIVE mg/dl
Leukocyte Esterase, Urine: NEGATIVE
Leukocyte Esterase: NEGATIVE
Nitrite, Urine: NEGATIVE
Nitrites: NEGATIVE
Protein, UA: NEGATIVE mg/dl
Protein: NEGATIVE mg/dl
Specific Gravity, UA: 1.02 (ref 1.005–1.030)
Specific gravity: 1.02 (ref 1.005–1.030)
Urobilinogen, UA, POCT: 0.2 mg/dl (ref 0.0–1.0)
Urobilinogen: 0.2 mg/dl (ref 0.0–1.0)
pH (UA): 6 (ref 5.0–9.0)
pH, UA: 6 (ref 5.0–9.0)

## 2020-10-31 LAB — CBC WITH AUTOMATED DIFF
BASOPHILS: 0.3 % (ref 0–3)
BASOPHILS: 0.4 % (ref 0–3)
EOSINOPHILS: 1.2 % (ref 0–5)
EOSINOPHILS: 1.7 % (ref 0–5)
HCT: 35.7 % — ABNORMAL LOW (ref 37.0–50.0)
HCT: 39.8 % (ref 37.0–50.0)
HGB: 11.9 gm/dl — ABNORMAL LOW (ref 13.0–17.2)
HGB: 13.2 gm/dl (ref 13.0–17.2)
IMMATURE GRANULOCYTES: 1.3 % (ref 0.0–3.0)
IMMATURE GRANULOCYTES: 1.4 % (ref 0.0–3.0)
LYMPHOCYTES: 13.8 % — ABNORMAL LOW (ref 28–48)
LYMPHOCYTES: 18.4 % — ABNORMAL LOW (ref 28–48)
MCH: 34 pg (ref 25.4–34.6)
MCH: 34.2 pg (ref 25.4–34.6)
MCHC: 33.2 gm/dl (ref 30.0–36.0)
MCHC: 33.3 gm/dl (ref 30.0–36.0)
MCV: 102 fL — ABNORMAL HIGH (ref 80.0–98.0)
MCV: 103.1 fL — ABNORMAL HIGH (ref 80.0–98.0)
MONOCYTES: 13.9 % — ABNORMAL HIGH (ref 1–13)
MONOCYTES: 15.4 % — ABNORMAL HIGH (ref 1–13)
MPV: 11.2 fL — ABNORMAL HIGH (ref 6.0–10.0)
MPV: 11.5 fL — ABNORMAL HIGH (ref 6.0–10.0)
NEUTROPHILS: 62.8 % (ref 34–64)
NEUTROPHILS: 69.4 % — ABNORMAL HIGH (ref 34–64)
NRBC: 0 (ref 0–0)
NRBC: 0 (ref 0–0)
PLATELET: 233 10*3/uL (ref 140–450)
PLATELET: 237 10*3/uL (ref 140–450)
RBC: 3.5 M/uL — ABNORMAL LOW (ref 3.60–5.20)
RBC: 3.86 M/uL (ref 3.60–5.20)
RDW-SD: 51.6 — ABNORMAL HIGH (ref 36.4–46.3)
RDW-SD: 52.7 — ABNORMAL HIGH (ref 36.4–46.3)
WBC: 9 10*3/uL (ref 4.0–11.0)
WBC: 9.6 10*3/uL (ref 4.0–11.0)

## 2020-10-31 LAB — METABOLIC PANEL, COMPREHENSIVE
ALT (SGPT): <7 U/L — ABNORMAL LOW (ref 10–49)
AST (SGOT): 14 U/L (ref 0.0–33.9)
Albumin: 1.5 gm/dl — ABNORMAL LOW (ref 3.4–5.0)
Alk. phosphatase: 94 U/L (ref 46–116)
Anion gap: 9 mmol/L (ref 5–15)
BUN: 10 mg/dl (ref 9–23)
Bilirubin, total: 1.9 mg/dl — ABNORMAL HIGH (ref 0.30–1.20)
CO2: 23 mEq/L (ref 20–31)
Calcium: 8.7 mg/dl (ref 8.7–10.4)
Chloride: 97 mEq/L — ABNORMAL LOW (ref 98–107)
Creatinine: 0.58 mg/dl (ref 0.55–1.02)
GFR est AA: >60
GFR est non-AA: >60
Glucose: 130 mg/dl — ABNORMAL HIGH (ref 74–106)
Potassium: 3.7 mEq/L (ref 3.5–5.1)
Protein, total: 5.4 gm/dl — ABNORMAL LOW (ref 5.7–8.2)
Sodium: 129 mEq/L — ABNORMAL LOW (ref 136–145)

## 2020-10-31 LAB — PROTHROMBIN TIME + INR
INR: 1.7 — ABNORMAL HIGH (ref 0.1–1.1)
Prothrombin time: 19.9 seconds — ABNORMAL HIGH (ref 10.2–12.9)

## 2020-10-31 LAB — PROCALCITONIN
PROCALCITONIN: 0.5 ng/ml (ref 0.00–0.50)
PROCALCITONIN: 0.5 ng/ml (ref 0.00–0.50)

## 2020-10-31 LAB — AMMONIA
Ammonia, plasma: 45 umol/L — ABNORMAL HIGH (ref 11.2–31.7)
Ammonia: 45 umol/L — ABNORMAL HIGH (ref 11.2–31.7)

## 2020-10-31 LAB — METABOLIC PANEL, BASIC
Anion gap: 6 mmol/L (ref 5–15)
BUN: 10 mg/dl (ref 9–23)
CO2: 24 mEq/L (ref 20–31)
Calcium: 8.2 mg/dl — ABNORMAL LOW (ref 8.7–10.4)
Chloride: 105 mEq/L (ref 98–107)
Creatinine: 0.56 mg/dl (ref 0.55–1.02)
GFR est AA: >60
GFR est non-AA: >60
Glucose: 104 mg/dl (ref 74–106)
Potassium: 3.7 mEq/L (ref 3.5–5.1)
Sodium: 135 mEq/L — ABNORMAL LOW (ref 136–145)

## 2020-10-31 LAB — LACTIC ACID
LACTIC ACID: 1.4 mmol/L (ref 0.5–2.2)
Lactic Acid: 1.4 mmol/L (ref 0.5–2.2)

## 2020-10-31 LAB — MAGNESIUM
Magnesium: 1.4 mg/dL — ABNORMAL LOW (ref 1.6–2.6)
Magnesium: 1.4 mg/dL — ABNORMAL LOW (ref 1.6–2.6)

## 2020-10-31 LAB — CBC WITH AUTO DIFFERENTIAL
Basophils %: 0.3 % (ref 0–3)
Basophils %: 0.4 % (ref 0–3)
Eosinophils %: 1.2 % (ref 0–5)
Eosinophils %: 1.7 % (ref 0–5)
Hematocrit: 35.7 % — ABNORMAL LOW (ref 37.0–50.0)
Hematocrit: 39.8 % (ref 37.0–50.0)
Hemoglobin: 11.9 gm/dl — ABNORMAL LOW (ref 13.0–17.2)
Hemoglobin: 13.2 gm/dl (ref 13.0–17.2)
Immature Granulocytes: 1.3 % (ref 0.0–3.0)
Immature Granulocytes: 1.4 % (ref 0.0–3.0)
Lymphocytes %: 13.8 % — ABNORMAL LOW (ref 28–48)
Lymphocytes %: 18.4 % — ABNORMAL LOW (ref 28–48)
MCH: 34 pg (ref 25.4–34.6)
MCH: 34.2 pg (ref 25.4–34.6)
MCHC: 33.2 gm/dl (ref 30.0–36.0)
MCHC: 33.3 gm/dl (ref 30.0–36.0)
MCV: 102 fL — ABNORMAL HIGH (ref 80.0–98.0)
MCV: 103.1 fL — ABNORMAL HIGH (ref 80.0–98.0)
MPV: 11.2 fL — ABNORMAL HIGH (ref 6.0–10.0)
MPV: 11.5 fL — ABNORMAL HIGH (ref 6.0–10.0)
Monocytes %: 13.9 % — ABNORMAL HIGH (ref 1–13)
Monocytes %: 15.4 % — ABNORMAL HIGH (ref 1–13)
Neutrophils %: 62.8 % (ref 34–64)
Neutrophils %: 69.4 % — ABNORMAL HIGH (ref 34–64)
Nucleated RBCs: 0 (ref 0–0)
Nucleated RBCs: 0 (ref 0–0)
Platelets: 233 10*3/uL (ref 140–450)
Platelets: 237 10*3/uL (ref 140–450)
RBC: 3.5 M/uL — ABNORMAL LOW (ref 3.60–5.20)
RBC: 3.86 M/uL (ref 3.60–5.20)
RDW-SD: 51.6 — ABNORMAL HIGH (ref 36.4–46.3)
RDW-SD: 52.7 — ABNORMAL HIGH (ref 36.4–46.3)
WBC: 9 10*3/uL (ref 4.0–11.0)
WBC: 9.6 10*3/uL (ref 4.0–11.0)

## 2020-10-31 LAB — COMPREHENSIVE METABOLIC PANEL
ALT: <7 U/L — ABNORMAL LOW (ref 10–49)
AST: 14 U/L (ref 0.0–33.9)
Albumin: 1.5 gm/dl — ABNORMAL LOW (ref 3.4–5.0)
Alkaline Phosphatase: 94 U/L (ref 46–116)
Anion Gap: 9 mmol/L (ref 5–15)
BUN: 10 mg/dl (ref 9–23)
CO2: 23 mEq/L (ref 20–31)
Calcium: 8.7 mg/dl (ref 8.7–10.4)
Chloride: 97 mEq/L — ABNORMAL LOW (ref 98–107)
Creatinine: 0.58 mg/dl (ref 0.55–1.02)
EGFR IF NonAfrican American: >60
GFR African American: >60
Glucose: 130 mg/dl — ABNORMAL HIGH (ref 74–106)
Potassium: 3.7 mEq/L (ref 3.5–5.1)
Sodium: 129 mEq/L — ABNORMAL LOW (ref 136–145)
Total Bilirubin: 1.9 mg/dl — ABNORMAL HIGH (ref 0.30–1.20)
Total Protein: 5.4 gm/dl — ABNORMAL LOW (ref 5.7–8.2)

## 2020-10-31 LAB — EKG 12-LEAD
Atrial Rate: 220 {beats}/min
Q-T Interval: 388 ms
QRS Duration: 88 ms
QTc Calculation (Bazett): 458 ms
R Axis: -80 degrees
T Axis: -156 degrees
Ventricular Rate: 84 {beats}/min

## 2020-10-31 LAB — BASIC METABOLIC PANEL
Anion Gap: 6 mmol/L (ref 5–15)
BUN: 10 mg/dl (ref 9–23)
CO2: 24 mEq/L (ref 20–31)
Calcium: 8.2 mg/dl — ABNORMAL LOW (ref 8.7–10.4)
Chloride: 105 mEq/L (ref 98–107)
Creatinine: 0.56 mg/dl (ref 0.55–1.02)
EGFR IF NonAfrican American: >60
GFR African American: >60
Glucose: 104 mg/dl (ref 74–106)
Potassium: 3.7 mEq/L (ref 3.5–5.1)
Sodium: 135 mEq/L — ABNORMAL LOW (ref 136–145)

## 2020-10-31 LAB — PROTIME-INR
INR: 1.7 — ABNORMAL HIGH (ref 0.1–1.1)
Protime: 19.9 seconds — ABNORMAL HIGH (ref 10.2–12.9)

## 2020-10-31 MED ORDER — LEVOTHYROXINE 125 MCG TAB
125 mcg | ORAL | Status: DC
Start: 2020-10-31 — End: 2020-11-05
  Administered 2020-11-01 – 2020-11-05 (×5): via ORAL

## 2020-10-31 MED ORDER — NS WITH POTASSIUM CHLORIDE 20 MEQ/L IV
20 mEq/L | INTRAVENOUS | Status: DC
Start: 2020-10-31 — End: 2020-11-02
  Administered 2020-10-31 – 2020-11-01 (×4): via INTRAVENOUS

## 2020-10-31 MED ORDER — MAGNESIUM SULFATE 2 GRAM/50 ML IVPB
2 gram/50 mL (4 %) | Freq: Once | INTRAVENOUS | Status: AC
Start: 2020-10-31 — End: 2020-10-31
  Administered 2020-10-31: 15:00:00 via INTRAVENOUS

## 2020-10-31 MED FILL — NS WITH POTASSIUM CHLORIDE 20 MEQ/L IV: 20 mEq/L | INTRAVENOUS | Qty: 1000

## 2020-10-31 MED FILL — LEVOTHYROXINE 20 MCG/ML IV SOLUTION: 20 mcg/mL | INTRAVENOUS | Qty: 3.75

## 2020-10-31 MED FILL — XIFAXAN 550 MG TABLET: 550 mg | ORAL | Qty: 1

## 2020-10-31 MED FILL — LACTULOSE 20 GRAM/30 ML ORAL SOLUTION: 20 gram/30 mL | ORAL | Qty: 30

## 2020-10-31 MED FILL — HYDROXYCHLOROQUINE 200 MG TAB: 200 mg | ORAL | Qty: 1

## 2020-10-31 MED FILL — MAGNESIUM SULFATE 2 GRAM/50 ML IVPB: 2 gram/50 mL (4 %) | INTRAVENOUS | Qty: 50

## 2020-10-31 MED FILL — TYLENOL 325 MG TABLET: 325 mg | ORAL | Qty: 2

## 2020-10-31 NOTE — Progress Notes (Signed)
Progress Notes by Elveria Rising, MD at 10/31/20 1314                Author: Elveria Rising, MD  Service: SURGERY  Author Type: Physician       Filed: 10/31/20 1319  Date of Service: 10/31/20 1314  Status: Signed          Editor: Elveria Rising, MD (Physician)                                   Acute Care Surgery Progress note   October 31, 2020             Patient: [name] [name]  Age: 87 y.o.  Sex: female          Date of Birth: 1933-12-18  Admit Date: 10/21/2020  Admit Doctor: [name], MD         MRN: [id number]   CSN: [id number]   PCP: [name], MD        Admit Date: 10/21/2020    LOS: 10 days          INTERVAL EVENTS: No acute events overnight.  Patient reports no worsening abdominal pain.  She does not have any nausea or vomiting.  She had 2 bowel movements.        Assessment:        Active Problems:     Metabolic encephalopathy (10/21/2020)        Contusion of left hip (10/21/2020)        Left elbow contusion (10/21/2020)        Multiple falls (10/21/2020)                  Diet: Currently n.p.o.   Abx:      Inpat Anti-Infectives (From admission,  onward)               Start        Ordered  Stop        10/25/20 1200    cephALEXin (KEFLEX) capsule 500 mg  500 mg,   Oral,   EVERY 6 HOURS             10/25/20 1043  10/28/20 1159        10/24/20 1200    rifAXIMin (XIFAXAN) tablet 550 mg  550 mg,   Oral,   2 TIMES DAILY             10/24/20 1047  --        10/21/20 2100    hydrOXYchloroQUINE (PLAQUENIL) tablet 200 mg  200 mg,   Oral,   2 TIMES DAILY            Note to Pharmacy: OP NWG:NFAO 200 mg by mouth two (2) times a day.          10/21/20 1905  --                                          Prophylaxis: Lovenox 40 mg daily for DVT prophylaxis      [name] [name] is a 87 y.o. female with multiple medical comorbidities including current COVID positivity with a periumbilical hernia with a small portion of bowel resulting in a partial bowel obstruction.   I believe I was able to reduce the majority  of  this last night and contrast progressed through the area into the colon without issue.  I discussed the patient with Dr. Caralyn Guile and believes that the patient will benefit from interval hernia repair  after resolution of her COVID and improvement of her encephalopathy.           Plan:           We will continue to follow the patient to ensure she does not develop worsening incarceration and bowel obstruction from the hernia.  Currently if we can delay the surgery she would likely benefit  from decreased risk of postoperative complications.      Thank you for allowing [place] to participate in the care of your patient.                 Subjective:        No acute events overnight.  Did develop some atrial fibrillation.           Objective:          Vitals:             10/31/20 0148  10/31/20 0349  10/31/20 0817  10/31/20 1152           BP:    125/83  115/76  131/79     Pulse:    91  91  90     Resp:    18  20  19      Temp:    99 ??F (37.2 ??C)  97.3 ??F (36.3 ??C)  97.2 ??F (36.2 ??C)     SpO2:    94%  95%  97%     Weight:  74.2 kg (163 lb 9.3 oz)                 Height:                    Intake and Output:   Current Shift: 08/18 0701 - 08/18 1900   In: 50 [I.V.:50]   Out: -    Last three shifts: 08/16 1901 - 08/18 0700   In: 2379 [P.O.:550; I.V.:1829]   Out: 1600 [Urine:1600]      Physical Exam:    GENERAL:  patient resting in bed in NAD, appears age stated, cooperative    HEENT:  sclera anicteric, normal cephalic, atraumatic    NECK:  supple; no appreciable JVD    CHEST: Irregularly irregular, regular rate   LUNGS:  No increased work of breathing.  Equal chest expansion bilaterally.    ABD: Soft, nontender, nondistended, periumbilical hernia with some overlying erythema and tender to palpation   EXT:  Warm.  No clubbing.  No cyanosis, swelling or edema.    SKIN:  No rash     NEURO:  awake, alert and oriented.  Moves all 4 extremities.    PSYCH:  normal affect          Lab/Data Review:         Recent Results (from the  past 12 hour(s))     CBC WITH AUTOMATED DIFF          Collection Time: 10/31/20  4:59 AM         Result  Value  Ref Range            WBC  9.0  4.0 - 11.0 1000/mm3       RBC  3.50 (L)  3.60 - 5.20 M/uL  HGB  11.9 (L)  13.0 - 17.2 gm/dl       HCT  09.835.7 (L)  11.937.0 - 50.0 %       MCV  102.0 (H)  80.0 - 98.0 fL       MCH  34.0  25.4 - 34.6 pg       MCHC  33.3  30.0 - 36.0 gm/dl       PLATELET  147237  829140 - 450 1000/mm3       MPV  11.5 (H)  6.0 - 10.0 fL       RDW-SD  51.6 (H)  36.4 - 46.3         NRBC  0  0 - 0         IMMATURE GRANULOCYTES  1.3  0.0 - 3.0 %       NEUTROPHILS  62.8  34 - 64 %       LYMPHOCYTES  18.4 (L)  28 - 48 %       MONOCYTES  15.4 (H)  1 - 13 %       EOSINOPHILS  1.7  0 - 5 %       BASOPHILS  0.4  0 - 3 %       METABOLIC PANEL, BASIC          Collection Time: 10/31/20  4:59 AM         Result  Value  Ref Range            Potassium  3.7  3.5 - 5.1 mEq/L            Chloride  105  98 - 107 mEq/L            Sodium  135 (L)  136 - 145 mEq/L       CO2  24  20 - 31 mEq/L       Glucose  104  74 - 106 mg/dl       BUN  10  9 - 23 mg/dl       Creatinine  5.620.56  0.55 - 1.02 mg/dl       GFR est AA  >13.0>60.0          GFR est non-AA  >60          Calcium  8.2 (L)  8.7 - 10.4 mg/dl       Anion gap  6  5 - 15 mmol/L       AMMONIA          Collection Time: 10/31/20  4:59 AM         Result  Value  Ref Range            Ammonia, plasma  45.0 (H)  11.2 - 31.7 umol/L       MAGNESIUM          Collection Time: 10/31/20  4:59 AM         Result  Value  Ref Range            Magnesium  1.4 (L)  1.6 - 2.6 mg/dL       LACTIC ACID          Collection Time: 10/31/20  4:59 AM         Result  Value  Ref Range            Lactic Acid  1.4  0.5 - 2.2 mmol/L  PROCALCITONIN          Collection Time: 10/31/20  4:59 AM         Result  Value  Ref Range            PROCALCITONIN  0.50  0.00 - 0.50 ng/ml           Imaging Review:     CT ABD PELV W CONT      Result Date: 10/30/2020   CT OF THE ABDOMEN AND PELVIS INDICATION:  abdominal pain.  COMPARISON: Abdominal radiographs 10/30/2020 TECHNIQUE: CT of the abdomen and pelvis was performed with nonionic intravenous contrast with coronal and sagittal reformatted images. All CT exams at  this facility use one or more dose reduction techniques including automatic exposure control, mA/kV adjustment per patient's size, or iterative reconstruction technique. FINDINGS: LOWER THORAX: Right lower lobe irregular nodules measuring 1.6 and 1.2  cm. Trace right pleural effusion. Moderate cardiomegaly. Calcifications of the mitral valve. PERITONEUM AND ABDOMINAL WALL: There is no free air or free fluid. LIVER: No focal hepatic mass.  BILIARY: No radiopaque gallstones. SPLEEN: No mass. ADRENALS:  No adrenal mass or nodule. PANCREAS: No mass or significant inflammatory change. RENAL: No solid enhancing renal mass. No hydronephrosis or nephrolithiasis. GI TRACT: Small hiatal hernia.    There is moderate distention of proximal small bowel, presumably  secondary to a loop of herniated small bowel extending into an umbilical hernia. The umbilical hernia sac demonstrates internal stranding. There is no pneumatosis. Oral contrast is noted within the colon BLADDER: Marked urinary bladder distention. PELVIC  VISCERA: No pelvic mass. VASCULATURE: Moderate vascular calcifications. LYMPH NODES: No abdominal or pelvic lymph nodes measuring greater than 1 cm in short axis. BONES: Degenerative disc disease with L1 and L2 compression deformities, similar to prior  MRI.  Bilateral hip replacements.       IMPRESSION: 1.  Small bowel obstruction secondary to presumed small bowel containing umbilical hernia. Stranding within the hernia sac suggesting engorgement. No pneumatosis. 2.  Marked urinary bladder distention. Consider Foley catheter if the patient  is unable to void. 3.  Irregular right lung densities. [name] [name] was notified of the above findings via telephone at 10/30/2020 8:13 PM EDT by Dr. Allena Katz. Guidelines for Management  of Incidental Pulmonary Nodules Detected on CT Images: From the Fleischner  [employer] 2017 SOLID NODULES Nodule Size:  < 6 mm Low-Risk Patient:  No CT followup needed. High-Risk Patient: Optional CT follow-up at 12 months Low-Risk Patient with multiple nodules: No CT follow-up needed. High-Risk Patient with multiple nodules: Optional  CT follow-up at 12 months. Nodule Size: 6-8 mm Low-Risk Patient: CT at 6-12 months, then consider CT at 18-24 months. High-Risk Patient:  Initial followup at 6-12 months and then at 18-24 months if no change. Low-Risk Patient with multiple nodules: CT  at 3-6 months, then consider CT at 18-24 months if no change. High-Risk Patient with multiple nodules: CT at 3-6 months, then at 18-24 months if no change. Nodule Size:  >8 mm Low-Risk Patient: Consider CT, PET/CT, or tissue sampling at 3 months. High-Risk  Patient:  Consider CT, PET/CT, or tissue sampling at 3 months. Low-Risk Patient with multiple nodules: CT at 3-6 months, then consider CT at 18-24 months if no change. High-Risk Patient with multiple nodules: CT at 3-6 months, then at 18-24 months if  no change. SUBSOLID NODULES: Nodule Size:  < 6 mm Groundglass: No CT followup needed. Part solid: No CT followup needed. Multiple nodules: CT  at 3-6 months. If stable, consider CT at 2 and 4 years. Nodule size > 6 mm Groundglass: CT at 6-12 months to  confirm presence. Then CT every 2 years until 5 years. Part solid: CT at 3-6 months to confirm presence. If unchanged and solid component remains less than 6 mm, annual CT should be performed for 5 years. Multiple nodules: CT at 3-6 months. Subsequent  management based on the most suspicious nodule. PLEASE NOTE DISTINCTION BETWEEN SOLID AND SUBSOLID NODULES.        Labs and imaging personally reviewed and my findings are as follows: Normal white blood cell count at 9, anemic at 11.9/35.7.  Urinalysis normal, hyponatremic at 135, hypomagnesemic at 1.4, otherwise  normal metabolic panel.  CT  scan of the abdomen pelvis reviewed and demonstrated contrast passage through the small bowel into the colon with a small portion of bowel within the periumbilical hernia sac.      Brown Human, MD, FACS   Acute Care Surgery Team   [facility]      For questions or concerns please contact the Acute Care Surgery Team  by dedicated pager: [phone]

## 2020-10-31 NOTE — Progress Notes (Signed)
Patient to remain on transmission-based precautions, at this time.  Will review on or after 8/29, if patient is still admitted.

## 2020-10-31 NOTE — Progress Notes (Signed)
Progress Note             Daily Progress Note: 10/31/2020    Assessment/Plan:        87 yo originally admitted with several falls and encephalopathy.     Pt slightly more confused this am.   Oriented to year and month, some misstatements.   Denies chest pain or SOB.   Some discomfort from periumbilical hernia.     Afebrile.   EKG with new A-fib with CVR, decreased anterior forces compared to 08/2017 tracing.        CBC, lactic acid, procalcitonin nl.   Ammonia 45.   UA nl., Cx pending.   Mg+ 1.4.       Lungs clear.   Abdomen with tenderness over hernia, some ecchymosis of skin.   Foley in place.          CT:   SBO with umbilical hernia, bladder distension, 2 RLL nodules.         Umbilical hernia.   Ileus vs PSBO over weekend with vomiting, didn't tolerate NGT well.   Tolerated clear liquids Mon and Tue, currently NPO with maintenance IV fluids.   Surgery note from last night reviewed.           (S/p sigmoid resection and bladder repair 06/2003, prior TAH, then subsequent BSO).  2.   New onset atrial fibrillation with CVR.   Consult placed to cardiology.     3.   Acute Covid-19 infection.   Essentially asymptomatic other than mild cough.   Call placed to infection control to see if we can DC isolation now 10 days in.     4.   Cirrhosis of liver (Hemochromatosis, Methotrexate for several years for RA) with hepatc encephalopathy and mild auto-anticoagulation (INR 1.4 on admission)  5.   Multiple falls, 2 within 24 hrs of admission.    Hx multiple prior orthopedic surgeries.  6.   Hypothyroidism, inadequate replacement.   Dose increased from 100 to 125 mcg this admission.   Currently receiving IV replacement.     7.    Hemochromatosis receiving periodic phlebotomies (~ 2/year) via Dr. Keturah Barre.    8.   RA, on Methotrexate approximately the last five years, DC'd about a month ago.  Continue Plaquenil.   Would have to wait a few weeks for a new DMARD with #1.   (Dr. Kalman Jewels)  9.   Hx subdural hematoma 08/2017, not requiring  surgery.  10.  HTN, currently normotensive off meds.  11.   Hypomagnesemia, replace.  12.   RLL lung nodules.   Eventual CT chest.       Discussed with patient that she is a moderate surgical risk.   She has realistic expectations but is at least willing to consider surgery to repair hernia if necessary.       Discussed with patient's son per telephone.         Subjective:      Review of Systems:  ROS    Objective:   Physical Exam: Physical Exam  Cardiovascular:      Rate and Rhythm: Normal rate. Rhythm irregular.   Pulmonary:      Effort: Pulmonary effort is normal.      Breath sounds: Normal breath sounds.   Abdominal:      Tenderness: There is no guarding or rebound.      Hernia: A hernia is present.   Musculoskeletal:      Right lower leg: No edema.  Left lower leg: No edema.   Neurological:      Mental Status: She is alert.       Visit Vitals  BP 115/76 (BP 1 Location: Right upper arm, BP Patient Position: Supine)   Pulse 91   Temp 97.3 ??F (36.3 ??C)   Resp 20   Ht '5\' 6"'  (1.676 m)   Wt 74.2 kg (163 lb 9.3 oz)   SpO2 95%   Breastfeeding No   BMI 26.40 kg/m??    O2 Flow Rate (L/min): 2 l/min O2 Device: None (Room air)    Temp (24hrs), Avg:98.4 ??F (36.9 ??C), Min:97.3 ??F (36.3 ??C), Max:100.2 ??F (37.9 ??C)    No intake/output data recorded.   08/16 1901 - 08/18 0700  In: 2379 [P.O.:550; I.V.:1829]  Out: 1600 [Urine:1600]      Data Review:       24 Hour Results:  Recent Results (from the past 24 hour(s))   CBC WITH AUTOMATED DIFF    Collection Time: 10/30/20  7:50 PM   Result Value Ref Range    WBC 9.6 4.0 - 11.0 1000/mm3    RBC 3.86 3.60 - 5.20 M/uL    HGB 13.2 13.0 - 17.2 gm/dl    HCT 39.8 37.0 - 50.0 %    MCV 103.1 (H) 80.0 - 98.0 fL    MCH 34.2 25.4 - 34.6 pg    MCHC 33.2 30.0 - 36.0 gm/dl    PLATELET 233 140 - 450 1000/mm3    MPV 11.2 (H) 6.0 - 10.0 fL    RDW-SD 52.7 (H) 36.4 - 46.3      NRBC 0 0 - 0      IMMATURE GRANULOCYTES 1.4 0.0 - 3.0 %    NEUTROPHILS 69.4 (H) 34 - 64 %    LYMPHOCYTES 13.8 (L) 28 - 48 %     MONOCYTES 13.9 (H) 1 - 13 %    EOSINOPHILS 1.2 0 - 5 %    BASOPHILS 0.3 0 - 3 %   METABOLIC PANEL, COMPREHENSIVE    Collection Time: 10/30/20  7:50 PM   Result Value Ref Range    Potassium 3.7 3.5 - 5.1 mEq/L    Chloride 97 (L) 98 - 107 mEq/L    Sodium 129 (L) 136 - 145 mEq/L    CO2 23 20 - 31 mEq/L    Glucose 130 (H) 74 - 106 mg/dl    BUN 10 9 - 23 mg/dl    Creatinine 0.58 0.55 - 1.02 mg/dl    GFR est AA >60.0      GFR est non-AA >60      Calcium 8.7 8.7 - 10.4 mg/dl    Anion gap 9 5 - 15 mmol/L    AST (SGOT) 14.0 0.0 - 33.9 U/L    ALT (SGPT) <7 (L) 10 - 49 U/L    Alk. phosphatase 94 46 - 116 U/L    Bilirubin, total 1.90 (H) 0.30 - 1.20 mg/dl    Protein, total 5.4 (L) 5.7 - 8.2 gm/dl    Albumin 1.5 (L) 3.4 - 5.0 gm/dl   PROTHROMBIN TIME + INR    Collection Time: 10/30/20  7:50 PM   Result Value Ref Range    Prothrombin time 19.9 (H) 10.2 - 12.9 seconds    INR 1.7 (H) 0.1 - 1.1     URINALYSIS W/ RFLX MICROSCOPIC    Collection Time: 10/31/20  1:00 AM   Result Value Ref Range    Color YELLOW  YELLOW,STRAW      Appearance CLEAR CLEAR      Glucose NEGATIVE NEGATIVE,Negative mg/dl    Bilirubin NEGATIVE NEGATIVE,Negative      Ketone NEGATIVE NEGATIVE,Negative mg/dl    Specific gravity 1.020 1.005 - 1.030      Blood NEGATIVE NEGATIVE,Negative      pH (UA) 6.0 5.0 - 9.0      Protein NEGATIVE NEGATIVE,Negative mg/dl    Urobilinogen 0.2 0.0 - 1.0 mg/dl    Nitrites NEGATIVE NEGATIVE,Negative      Leukocyte Esterase NEGATIVE NEGATIVE,Negative     CBC WITH AUTOMATED DIFF    Collection Time: 10/31/20  4:59 AM   Result Value Ref Range    WBC 9.0 4.0 - 11.0 1000/mm3    RBC 3.50 (L) 3.60 - 5.20 M/uL    HGB 11.9 (L) 13.0 - 17.2 gm/dl    HCT 35.7 (L) 37.0 - 50.0 %    MCV 102.0 (H) 80.0 - 98.0 fL    MCH 34.0 25.4 - 34.6 pg    MCHC 33.3 30.0 - 36.0 gm/dl    PLATELET 237 140 - 450 1000/mm3    MPV 11.5 (H) 6.0 - 10.0 fL    RDW-SD 51.6 (H) 36.4 - 46.3      NRBC 0 0 - 0      IMMATURE GRANULOCYTES 1.3 0.0 - 3.0 %    NEUTROPHILS 62.8 34 -  64 %    LYMPHOCYTES 18.4 (L) 28 - 48 %    MONOCYTES 15.4 (H) 1 - 13 %    EOSINOPHILS 1.7 0 - 5 %    BASOPHILS 0.4 0 - 3 %   METABOLIC PANEL, BASIC    Collection Time: 10/31/20  4:59 AM   Result Value Ref Range    Potassium 3.7 3.5 - 5.1 mEq/L    Chloride 105 98 - 107 mEq/L    Sodium 135 (L) 136 - 145 mEq/L    CO2 24 20 - 31 mEq/L    Glucose 104 74 - 106 mg/dl    BUN 10 9 - 23 mg/dl    Creatinine 0.56 0.55 - 1.02 mg/dl    GFR est AA >60.0      GFR est non-AA >60      Calcium 8.2 (L) 8.7 - 10.4 mg/dl    Anion gap 6 5 - 15 mmol/L   AMMONIA    Collection Time: 10/31/20  4:59 AM   Result Value Ref Range    Ammonia, plasma 45.0 (H) 11.2 - 31.7 umol/L   MAGNESIUM    Collection Time: 10/31/20  4:59 AM   Result Value Ref Range    Magnesium 1.4 (L) 1.6 - 2.6 mg/dL   LACTIC ACID    Collection Time: 10/31/20  4:59 AM   Result Value Ref Range    Lactic Acid 1.4 0.5 - 2.2 mmol/L   PROCALCITONIN    Collection Time: 10/31/20  4:59 AM   Result Value Ref Range    PROCALCITONIN 0.50 0.00 - 0.50 ng/ml           Problem List:  Problem List as of 10/31/2020 Never Reviewed            Codes Class Noted - Resolved    Metabolic encephalopathy TGG-26-RS: G93.41  ICD-9-CM: 348.31  10/21/2020 - Present        Contusion of left hip ICD-10-CM: W54.62VO  ICD-9-CM: 924.01  10/21/2020 - Present        Left elbow contusion ICD-10-CM: J50.09FG  ICD-9-CM: 923.11  10/21/2020 - Present        Multiple falls ICD-10-CM: R29.6  ICD-9-CM: V15.88  10/21/2020 - Present        Deep venous thrombosis (HCC) ICD-10-CM: I82.409  ICD-9-CM: 453.40  Unknown - Present        Subdural hematoma caused by concussion [facility]) ICD-10-CM: U44.0H4V  ICD-9-CM: 852.29  08/27/2017 - Present        Subdural hematoma (HCC) ICD-10-CM: Q25.9D6L  ICD-9-CM: 432.1  08/27/2017 - Present       Medications reviewed  Current Facility-Administered Medications   Medication Dose Route Frequency    magnesium sulfate 2 g/50 ml IVPB (premix or compounded)  2 g IntraVENous ONCE    0.9% sodium chloride with KCl  20 mEq/L infusion   IntraVENous CONTINUOUS    levothyroxine (SYNTHROID) injection 75 mcg  75 mcg IntraVENous Q24H    lactulose (CHRONULAC) 10 gram/15 mL solution 30 mL  20 g Oral BID    [Held by provider] metoprolol tartrate (LOPRESSOR) tablet 75 mg  75 mg Oral BID    [Held by provider] lisinopriL (PRINIVIL, ZESTRIL) tablet 40 mg  40 mg Oral DAILY    rifAXIMin (XIFAXAN) tablet 550 mg  550 mg Oral BID    enoxaparin (LOVENOX) injection 40 mg  40 mg SubCUTAneous Q24H    [Held by provider] levothyroxine (SYNTHROID) tablet 125 mcg  125 mcg Oral 6am    [Held by provider] furosemide (LASIX) tablet 40 mg  40 mg Oral DAILY    albuterol-ipratropium (DUO-NEB) 2.5 MG-0.5 MG/3 ML  3 mL Nebulization Q4H PRN    [Held by provider] magnesium oxide (MAG-OX) tablet 400 mg  400 mg Oral DAILY    [Held by provider] melatonin tablet 3 mg  3 mg Oral QHS    [Held by provider] potassium chloride (K-DUR, KLOR-CON M20) SR tablet 40 mEq  40 mEq Oral DAILY    [Held by provider] calcium-vitamin D (OS-CAL +D3) 500 mg-200 unit per tablet 1 Tablet  1 Tablet Oral DAILY WITH LUNCH    hydrOXYchloroQUINE (PLAQUENIL) tablet 200 mg  200 mg Oral BID    [Held by provider] folic acid (FOLVITE) tablet 1 mg  1 mg Oral DAILY    naloxone (NARCAN) injection 0.1 mg  0.1 mg IntraVENous PRN    acetaminophen (TYLENOL) tablet 650 mg  650 mg Oral Q4H PRN    Or    acetaminophen (TYLENOL) solution 650 mg  650 mg Oral Q4H PRN    Or    acetaminophen (TYLENOL) suppository 650 mg  650 mg Rectal Q4H PRN    [Held by provider] HYDROcodone-acetaminophen (NORCO) 5-325 mg per tablet 1 Tablet  1 Tablet Oral Q6H PRN    ondansetron (ZOFRAN) injection 4 mg  4 mg IntraVENous Q4H PRN       Care Plan discussed with: Patient/Family    Total time spent with patient: 45 minutes.    Colen Darling, MD  October 31, 2020

## 2020-10-31 NOTE — Consults (Signed)
Consults by  Tyson Alias, Kirtland Bouchard. Shahid, MD at 10/31/20 1415                Author: Tyson Alias, Kirtland Bouchard. Shahid, MD  Service: Cardiology  Author Type: Physician       Filed: 10/31/20 1840  Date of Service: 10/31/20 1415  Status: Signed          Editor: Tyson Alias, K. Shahid, MD (Physician)               [facility]   [facility]   Consult note          Patient: [name] [name]  Age: 87 y.o.  Sex: female          Date of Birth: [birth year]  Admit Date: 10/21/2020  PCP: [name], MD         MRN: [id number]   CSN: [id number]            Requesting physician: Primary Team   Outpatient cardiologist: None.      [name] [name] is a 87 y.o. female who is being seen on consult for Atrial Fibrillation.        Impression:          New Onset AFIB   o  Noted this AM (10/31/20)   o  Rate controlled (84 BPM)   o  No previously documented hx of AFIB   o  CHADS2VASc = 6   o  ECHO: pending     Periumbilical Hernia   o  With partial SBO     Metabolic Encephalopathy     COVID-19 Infection   o  (+) 10/21/20     Cirrhosis/Hemochromatosis     Hx of Multiple Falls   o  2 w/in 24 hours of this admission     Hypothyroidism     Rheumatoid Arthritis   o  Chronic tx w/Plaquenil     Hx of subdural hematoma   o  08/2017; no surgery     HTN     COVID-positive.  Minimal symptoms.      Cardiac testing:       ECHO (12/2016) -       1. Normal sized left ventricle with normal wall thickness.   2. Normal LV systolic function with a calculated EF of 55%. No regional wall   motion abnormalities.   3. Grade 1 (mild) diastolic dysfunction.   4. Normal RV size and function.   5. No hemodynamically significant valvular abnormalities.   6. Cannot estimate RVSP due to inadequate TR jet.        Recommendations:        1.  PAF: rate controlled. Currently holding BB. Would resume.  Once p.o. intake is reestablished.   2.  Oral anticoagulation.  Elevated CHADS2VAsc score but w/hx of falls, high-risk for bleeding.  Give her a short-term  course while we contemplate cardioversion.  She has been on anticoagulation in the  past as well   3.  Preop cardiac evaluation.  She is at a minimum risk of a perioperative cardiac event and may go to abdominal surgery from cardiac standpoint.   Seen and examined   Evaluation plan as above      Signed By:  K. Mickie Kay, MD           October 31, 2020               Thank you for the consult; We will follow  HPI:  Pt with PMHx as noted above who is being consulted for new onset AFIB. PT admitted to hospital on 10/21/20 after being sent from SNF for unwitnessed fall. There was also concern regarding encephalopathy related to liver cirrhosis and concomitant hypothyroidism.  Developed ABD pain and distention -> incarcerated periumbilical hernia w/possible, partial SBO. Followed by surgery and the plan is for surgical correction down the road. This AM PT developed AFIB. ECG: rate controlled, 84. No previously documented hx  of AFIB. ECHO ordered and consult to cards placed.        Chief Complaint       Patient presents with        ?  Fall        ?  Hip Pain           CXR:     CXR Results  (Last 48 hours)             None                     EKG:   Personal Review: AFIB, rate controlled, non-specific st changes           Admission diagnosis: <principal problem not specified>     Patient Active Problem List        Diagnosis  Code         ?  Deep venous thrombosis (HCC)  I82.409     ?  Subdural hematoma caused by concussion [facility])  S06.5X9A     ?  Subdural hematoma (HCC)  S06.5X9A     ?  Metabolic encephalopathy  G93.41     ?  Contusion of left hip  S70.02XA     ?  Left elbow contusion  S50.02XA         ?  Multiple falls  R29.6             Past Medical History:        Diagnosis  Date         ?  Arthritis       ?  Deep venous thrombosis (HCC)       ?  Endocrine disease            hypothyroid         ?  Hemochromatosis           ?  Hypertension            Past Surgical History:         Procedure  Laterality  Date          ?   HX ORTHOPAEDIC              left knee replacement          Social History          Socioeconomic History         ?  Marital status:  WIDOWED              Spouse name:  Not on file         ?  Number of children:  Not on file     ?  Years of education:  Not on file     ?  Highest education level:  Not on file       Occupational History        ?  Not on file       Tobacco Use         ?  Smoking status:  Never     ?  Smokeless tobacco:  Never       Vaping Use         ?  Vaping Use:  Never used       Substance and Sexual Activity         ?  Alcohol use:  No     ?  Drug use:  No     ?  Sexual activity:  Not on file        Other Topics  Concern        ?  Not on file       Social History Narrative        ?  Not on file          Social Determinants of Health          Financial Resource Strain: Not on file     Food Insecurity: Not on file     Transportation Needs: Not on file     Physical Activity: Not on file     Stress: Not on file     Social Connections: Not on file     Intimate Partner Violence: Not on file       Housing Stability: Not on file        History reviewed. No pertinent family history.        Allergies        Allergen  Reactions         ?  Shellfish Derived  Hives         ?  Sulfa (Sulfonamide Antibiotics)  Hives             Home Medications:          Prior to Admission medications             Medication  Sig  Start Date  End Date  Taking?  Authorizing Provider            alendronate (FOSAMAX) 70 mg tablet  Take 70 mg by mouth every seven (7) days.  10/26/20    Yes  Provider, Historical     furosemide (Lasix) 40 mg tablet  Take 40 mg by mouth every Monday, Wednesday, Friday. In afternoon  10/23/20    Yes  Provider, Historical     lactulose (CHRONULAC) 10 gram/15 mL solution  Take 10 g by mouth in the morning.      Yes  Provider, Historical     magnesium oxide (MAG-OX) 400 mg tablet  Take 400 mg by mouth in the morning.      Yes  Provider, Historical     melatonin 3 mg tablet  Take  by mouth nightly.      Yes   Provider, Historical     potassium chloride (K-DUR, KLOR-CON M20) 20 mEq tablet  Take 40 mEq by mouth in the morning.      Yes  Provider, Historical     albuterol-ipratropium (DUO-NEB) 2.5 mg-0.5 mg/3 ml nebu  3 mL by Nebulization route every four (4) hours as needed (sob).  08/31/17      Musselmani, Cleon Gustin, MD     methotrexate (RHEUMATREX) 2.5 mg tablet  Take 12.5 mg by mouth every Tuesday. Methotrexate 2.5 mg tab, take 5 tabs po q week  Q tuesday   Patient not taking: Reported on 10/21/2020        Provider, Historical     hydroxychloroquine (PLAQUENIL) 200 mg tablet  Take 200 mg by mouth two (2) times a day.        Provider, Historical     furosemide (LASIX) 40 mg tablet  Take 40 mg by mouth daily.        Provider, Historical     metoprolol tartrate (LOPRESSOR) 50 mg tablet  Take 50 mg by mouth two (2) times a day.        Provider, Historical     folic acid (FOLVITE) 1 mg tablet  Take 1 mg by mouth daily.        Provider, Historical     potassium chloride SR (KLOR-CON 10) 10 mEq tablet  Take 10 mEq by mouth daily.   Patient not taking: Reported on 10/21/2020        Provider, Historical     calcium-cholecalciferol, d3, (CALCIUM 600 + D) 600-125 mg-unit tab  Take 1 Tab by mouth daily.        Provider, Historical     lisinopril (PRINIVIL, ZESTRIL) 10 mg tablet  Take 10 mg by mouth daily.        Other, Phys, MD     predniSONE (DELTASONE) 10 mg tablet  Take  by mouth daily (with breakfast).   Patient not taking: Reported on 10/21/2020        Other, Phys, MD            levothyroxine (SYNTHROID) 125 mcg tablet  Take 125 mcg by mouth Daily (before breakfast).        Other, Phys, MD           Inpt meds:     Current Facility-Administered Medications          Medication  Dose  Route  Frequency           ?  [START ON 11/01/2020] levothyroxine (SYNTHROID) tablet 125 mcg   125 mcg  Oral  6am     ?  0.9% sodium chloride with KCl 20 mEq/L infusion     IntraVENous  CONTINUOUS     ?  lactulose (CHRONULAC) 10 gram/15 mL solution 30 mL    20 g  Oral  BID           ?  [Held by provider] metoprolol tartrate (LOPRESSOR) tablet 75 mg   75 mg  Oral  BID           ?  [Held by provider] lisinopriL (PRINIVIL, ZESTRIL) tablet 40 mg   40 mg  Oral  DAILY     ?  rifAXIMin (XIFAXAN) tablet 550 mg   550 mg  Oral  BID     ?  enoxaparin (LOVENOX) injection 40 mg   40 mg  SubCUTAneous  Q24H     ?  [Held by provider] levothyroxine (SYNTHROID) tablet 125 mcg   125 mcg  Oral  6am     ?  [Held by provider] furosemide (LASIX) tablet 40 mg   40 mg  Oral  DAILY     ?  [Held by provider] magnesium oxide (MAG-OX) tablet 400 mg   400 mg  Oral  DAILY     ?  [Held by provider] melatonin tablet 3 mg   3 mg  Oral  QHS     ?  [Held by provider] potassium chloride (K-DUR, KLOR-CON M20) SR tablet 40 mEq   40 mEq  Oral  DAILY     ?  [Held by provider] calcium-vitamin D (OS-CAL +D3) 500 mg-200 unit per tablet 1 Tablet  1 Tablet  Oral  DAILY WITH LUNCH     ?  hydrOXYchloroQUINE (PLAQUENIL) tablet 200 mg   200 mg  Oral  BID           ?  [Held by provider] folic acid (FOLVITE) tablet 1 mg   1 mg  Oral  DAILY             Review of Systems:              Positives in bold      Constitutional: fever, chills, weight loss, weakness, fatigue    Eyes: blurred vision, double vision   ENT: sore throat, congestion, ear pain   Respiratory: cough, shortness of breath, orthopnea, PND, wheezing   Cardiovascular: chest pain/pressure, palpitations, syncope   Gastrointestinal: abdominal pain, nausea, vomiting, diarrhea, melena, hematochezia, hematemesis   Genitourinary: painful urination, frequency, urgency   Musculoskeletal: joint pain, swelling   Integumentary: rashes, wounds   Endocrine: increased polydipsia, polyuria, polyphagia, cold/heat intolerance   Neurological: headache, dizziness, extremity weakness, paresthesias   Psychological: depression, anxiety, substance abuse        Physical Assessment:        Visit Vitals      BP  131/79 (BP 1 Location: Right upper arm, BP Patient Position: Supine)      Pulse  90     Temp  97.2 F (36.2 C)     Resp  19     Ht  5\' 6"  (1.676 m)     Wt  74.2 kg (163 lb 9.3 oz)     SpO2  97%     Breastfeeding  No        BMI  26.40 kg/m              GEN: NAD, conversant, well nourished, well developed   HEENT:  conjunctiva not injected, sclera anicteric, PERRL   Neck: No JVD, supple, trachea midline   Resp: Clear to auscultation bilaterally; No wheezes or rales   CV: Irregular rhythm normal s1and s2; No murmurs or rubs   Abd: Soft but distended.  She denies any tenderness on palpation.   Ext: No clubbing, cyanosis or edema   Neuro: Alert and oriented, moves all four extremities   Skin: Warm, Dry, well perfused   Psyc: mood is good, appropriate affect      Labs:      GFR: Estimated Creatinine Clearance: 60.6 mL/min (by C-G formula based on SCr of 0.56 mg/dL).         Lab Results         Component  Value  Date/Time            WBC  9.0  10/31/2020 04:59 AM       RBC  3.50 (L)  10/31/2020 04:59 AM       HGB  11.9 (L)  10/31/2020 04:59 AM       HCT  35.7 (L)  10/31/2020 04:59 AM       MCV  102.0 (H)  10/31/2020 04:59 AM       MCH  34.0  10/31/2020 04:59 AM       MCHC  33.3  10/31/2020 04:59 AM       RDW  13.4  05/04/2012 09:52 AM            PLT  237  10/31/2020 04:59 AM             Lab Results  Component  Value  Date/Time            BANDS  3.0  10/28/2020 06:27 PM       GRANS  62.8  10/31/2020 04:59 AM       LYMPH  18.4 (L)  10/31/2020 04:59 AM       MONOS  15.4 (H)  10/31/2020 04:59 AM       EOS  1.7  10/31/2020 04:59 AM            BASOS  0.4  10/31/2020 04:59 AM             Lab Results         Component  Value  Date            NA  135 (L)  10/31/2020       K  3.7  10/31/2020       CL  105  10/31/2020       CO2  24  10/31/2020       BUN  10  10/31/2020       CREA  0.56  10/31/2020       GLU  104  10/31/2020       CA  8.2 (L)  10/31/2020            MG  1.4 (L)  10/31/2020              Lab Results         Component  Value  Date/Time            Troponin-I  0.037  08/27/2017 09:30 AM               Lab Results         Component  Value  Date/Time            TSH  9.565 (H)  10/21/2020 12:52 PM            Free T4  0.66 (L)  10/21/2020 12:52 PM              Lab Results         Component  Value  Date            PTP  19.9 (H)  10/30/2020       INR  1.7 (H)  10/30/2020            APTT  31.5  08/27/2017             Lab Results         Component  Value  Date/Time            Hemoglobin A1c  5.8 (H)  10/21/2018 12:48 PM               Lab Results         Component  Value  Date/Time            Cholesterol, total  113  09/06/2020 03:18 PM       HDL Cholesterol  48  09/06/2020 03:18 PM       LDL, calculated  50  09/06/2020 03:18 PM       Triglyceride  74  10/23/2020 12:11 AM            CHOL/HDL Ratio  2.4  09/06/2020 03:18 PM              Lab Results         Component  Value  Date            ALB  1.5 (L)  10/30/2020       TP  5.4 (L)  10/30/2020       CBIL  0.7 (H)  10/21/2020       TBILI  1.90 (H)  10/30/2020       ALT  <7 (L)  10/30/2020       AP  94  10/30/2020            LPSE  42  09/06/2020           PPE: Surgical Mask, Face Shield.       I have personally and independently reviewed all imaging, diagnostic medical testing, and laboratory data to date.  I have integrated these findings into my Assessment and Plan outlined above.      Rosanna Randyaniel Kiernan, PA-C   [facility]   [facility]   October 31, 2020 2:16 PM

## 2020-10-31 NOTE — Progress Notes (Signed)
Chaplain Follow-Up    Start Visit: 1112  End Visit: 1114    Chaplain conducted a Follow up consultation and Spiritual Assessment for [name] [name], who is a 87 y.o.,female.      The Chaplain provided the following Interventions:  Continued the relationship of care and support.   Active listening.  Patient is COVID.  Chart reviewed.    The following outcomes were achieved:  Patient expressed gratitude for chaplain's visit.  Visit was by telephone.    Assessment:  There are no further spiritual or religious issues which require Spiritual Care Services interventions at this time.     Plan:  Chaplains will continue to follow and will provide pastoral care on an as needed/requested basis.    Nona Dell.  Scientist, water quality Care   [phone]

## 2020-11-01 MED ORDER — METOPROLOL TARTRATE 25 MG TAB
25 mg | Freq: Three times a day (TID) | ORAL | Status: DC
Start: 2020-11-01 — End: 2020-11-13
  Administered 2020-11-01 – 2020-11-13 (×36): via ORAL

## 2020-11-01 MED ORDER — AMIODARONE 200 MG TAB
200 mg | Freq: Three times a day (TID) | ORAL | Status: AC
Start: 2020-11-01 — End: 2020-11-03
  Administered 2020-11-01 – 2020-11-03 (×6): via ORAL

## 2020-11-01 MED ORDER — ENOXAPARIN 300 MG/3 ML VIAL
300 mg/3 mL | Freq: Two times a day (BID) | SUBCUTANEOUS | Status: DC
Start: 2020-11-01 — End: 2020-11-07
  Administered 2020-11-02 – 2020-11-07 (×10): via SUBCUTANEOUS

## 2020-11-01 MED FILL — LEVOTHYROXINE 125 MCG TAB: 125 mcg | ORAL | Qty: 1

## 2020-11-01 MED FILL — NS WITH POTASSIUM CHLORIDE 20 MEQ/L IV: 20 mEq/L | INTRAVENOUS | Qty: 1000

## 2020-11-01 MED FILL — ENOXAPARIN 40 MG/0.4 ML SUB-Q SYRINGE: 40 mg/0.4 mL | SUBCUTANEOUS | Qty: 0.4

## 2020-11-01 MED FILL — METOPROLOL TARTRATE 25 MG TAB: 25 mg | ORAL | Qty: 1

## 2020-11-01 MED FILL — LACTULOSE 20 GRAM/30 ML ORAL SOLUTION: 20 gram/30 mL | ORAL | Qty: 30

## 2020-11-01 MED FILL — XIFAXAN 550 MG TABLET: 550 mg | ORAL | Qty: 1

## 2020-11-01 MED FILL — AMIODARONE 200 MG TAB: 200 mg | ORAL | Qty: 2

## 2020-11-01 MED FILL — HYDROXYCHLOROQUINE 200 MG TAB: 200 mg | ORAL | Qty: 1

## 2020-11-01 NOTE — Progress Notes (Signed)
Progress Note             Daily Progress Note: 11/01/2020    Assessment/Plan:        87 yo originally admitted with several falls and encephalopathy.     Appreciate surgery and cardiology assistance; notes reviewed.       Pt more alert this am, seems to decompensate quickly with missed doses of Xifaxan and Lactulose.   Tolerating liquid diet.   No nausea.   No chest pain, sob.   Minimal cough.    Afebrile.   Lungs clear.   Tenderness, bruising over hernia about the same.         Umbilical hernia.   Surgery notes reviewed.   Will try advance to soft diet today, decrease IV fluids.   Would be nice to ambulate more but limited by weakness and isolation makes it more difficult to be with the patient as frequently.           (S/p sigmoid resection and bladder repair 06/2003, prior TAH, then subsequent BSO).  2.   New onset atrial fibrillation with CVR.   Cardiology notes reviewed.   Defer resumption of BB, anticoagulation decisions to cardiology.     3.   Acute Covid-19 infection.   Essentially asymptomatic other than mild cough.   Deemed to be higher risk with 20 day isolation per hospital's protocol.   Will try to arrange a way for family to visit.     4.   Cirrhosis of liver (Hemochromatosis, Methotrexate for several years for RA) with hepatic encephalopathy and mild auto-anticoagulation (INR 1.4 on admission).   Continue dual Lactulose and Xifaxin for now.  5.   Multiple falls, 2 within 24 hrs of admission.    Hx multiple prior orthopedic surgeries.  6.   Hypothyroidism, inadequate replacement.   Dose increased from 100 to 125 mcg this admission.    7.    Hemochromatosis receiving periodic phlebotomies (~ 2/year) via Dr. Arnette Felts.    8.   RA, on Methotrexate approximately the last five years, DC'd about a month ago.  Continue Plaquenil.   Would have to wait a few weeks for a new DMARD with #1.   (Dr. Salem Caster)  9.   Hx subdural hematoma 08/2017, not requiring surgery.  10.  HTN, currently normotensive off meds.  11.    Hypomagnesemia, replace.  12.   RLL lung nodules.   Eventual CT chest.       A tentative overall plan for patient, subject to change, would be rehab after hospitalization for patient to gain muscular strength, hopefully improve her nutritional status, move further away from acute Covid-19 infection, refer to gen surgery for pseudo-elective repair of abdominal hernia.       Discussed with son per telephone.    Repeat labs tomorrow.           Subjective:      Review of Systems:  ROS    Objective:   Physical Exam: Physical Exam  Cardiovascular:      Rate and Rhythm: Normal rate. Rhythm irregular.   Pulmonary:      Effort: Pulmonary effort is normal.      Breath sounds: Normal breath sounds.   Abdominal:      Tenderness: There is no guarding or rebound.      Hernia: A hernia is present.   Musculoskeletal:      Right lower leg: No edema.      Left lower leg: No edema.  Neurological:      Mental Status: She is alert.       Visit Vitals  BP (!) 142/89 (BP 1 Location: Right upper arm, BP Patient Position: Supine)   Pulse 91   Temp 97.3 ??F (36.3 ??C)   Resp 20   Ht 5\' 6"  (1.676 m)   Wt 74.2 kg (163 lb 9.3 oz)   SpO2 94%   Breastfeeding No   BMI 26.40 kg/m??    O2 Flow Rate (L/min): 2 l/min O2 Device: None (Room air)    Temp (24hrs), Avg:98.2 ??F (36.8 ??C), Min:97.2 ??F (36.2 ??C), Max:99.3 ??F (37.4 ??C)    08/19 0701 - 08/19 1900  In: 150 [P.O.:150]  Out: 700 [Urine:700]   08/17 1901 - 08/19 0700  In: 1707.5 [P.O.:180; I.V.:1527.5]  Out: 1600 [Urine:1600]      Data Review:       24 Hour Results:  No results found for this or any previous visit (from the past 24 hour(s)).          Problem List:  Problem List as of 11/01/2020 Date Reviewed: 11-30-2020            Codes Class Noted - Resolved    * (Principal) Atrial fibrillation (HCC) ICD-10-CM: I48.91  ICD-9-CM: 427.31  2020-11-30 - Present        Other partial intestinal obstruction (HCC) ICD-10-CM: K56.690  ICD-9-CM: 560.89  11/30/2020 - Present        Metabolic encephalopathy  ICD-10-CM: G93.41  ICD-9-CM: 348.31  10/21/2020 - Present        Contusion of left hip ICD-10-CM: S70.02XA  ICD-9-CM: 924.01  10/21/2020 - Present        Left elbow contusion ICD-10-CM: S50.02XA  ICD-9-CM: 923.11  10/21/2020 - Present        Multiple falls ICD-10-CM: R29.6  ICD-9-CM: V15.88  10/21/2020 - Present        Deep venous thrombosis (HCC) ICD-10-CM: I82.409  ICD-9-CM: 453.40  Unknown - Present        Subdural hematoma caused by concussion [facility]) ICD-10-CM: [facility]  ICD-9-CM: 852.29  08/27/2017 - Present        Subdural hematoma (HCC) ICD-10-CM: 08/29/2017  ICD-9-CM: 432.1  08/27/2017 - Present       Medications reviewed  Current Facility-Administered Medications   Medication Dose Route Frequency    levothyroxine (SYNTHROID) tablet 125 mcg  125 mcg Oral 6am    0.9% sodium chloride with KCl 20 mEq/L infusion   IntraVENous CONTINUOUS    lactulose (CHRONULAC) 10 gram/15 mL solution 30 mL  20 g Oral BID    [Held by provider] metoprolol tartrate (LOPRESSOR) tablet 75 mg  75 mg Oral BID    [Held by provider] lisinopriL (PRINIVIL, ZESTRIL) tablet 40 mg  40 mg Oral DAILY    rifAXIMin (XIFAXAN) tablet 550 mg  550 mg Oral BID    enoxaparin (LOVENOX) injection 40 mg  40 mg SubCUTAneous Q24H    [Held by provider] furosemide (LASIX) tablet 40 mg  40 mg Oral DAILY    albuterol-ipratropium (DUO-NEB) 2.5 MG-0.5 MG/3 ML  3 mL Nebulization Q4H PRN    [Held by provider] magnesium oxide (MAG-OX) tablet 400 mg  400 mg Oral DAILY    [Held by provider] melatonin tablet 3 mg  3 mg Oral QHS    [Held by provider] potassium chloride (K-DUR, KLOR-CON M20) SR tablet 40 mEq  40 mEq Oral DAILY    [Held by provider] calcium-vitamin D (OS-CAL +D3) 500 mg-200 unit  per tablet 1 Tablet  1 Tablet Oral DAILY WITH LUNCH    hydrOXYchloroQUINE (PLAQUENIL) tablet 200 mg  200 mg Oral BID    [Held by provider] folic acid (FOLVITE) tablet 1 mg  1 mg Oral DAILY    naloxone (NARCAN) injection 0.1 mg  0.1 mg IntraVENous PRN    acetaminophen (TYLENOL) tablet 650 mg   650 mg Oral Q4H PRN    Or    acetaminophen (TYLENOL) solution 650 mg  650 mg Oral Q4H PRN    Or    acetaminophen (TYLENOL) suppository 650 mg  650 mg Rectal Q4H PRN    [Held by provider] HYDROcodone-acetaminophen (NORCO) 5-325 mg per tablet 1 Tablet  1 Tablet Oral Q6H PRN    ondansetron (ZOFRAN) injection 4 mg  4 mg IntraVENous Q4H PRN       Care Plan discussed with: Patient/Family    Total time spent with patient: 40 minutes.    [name], MD  November 01, 2020

## 2020-11-01 NOTE — Progress Notes (Signed)
Problem: Airway Clearance - Ineffective  Goal: Achieve or maintain patent airway  Outcome: Progressing Towards Goal     Problem: Gas Exchange - Impaired  Goal: Absence of hypoxia  Outcome: Progressing Towards Goal  Goal: Promote optimal lung function  Outcome: Progressing Towards Goal     Problem: Breathing Pattern - Ineffective  Goal: Ability to achieve and maintain a regular respiratory rate  Outcome: Progressing Towards Goal     Problem: Body Temperature -  Risk of, Imbalanced  Goal: Ability to maintain a body temperature within defined limits  Outcome: Progressing Towards Goal  Goal: Will regain or maintain usual level of consciousness  Outcome: Progressing Towards Goal  Goal: Complications related to the disease process, condition or treatment will be avoided or minimized  Outcome: Progressing Towards Goal

## 2020-11-01 NOTE — Progress Notes (Signed)
[facility] ([facility]   CARDIOLOGY PROGRESS NOTE  RECS:  NEW AF               Newly found AF yesterday, still in 72 hour window               No apparent plans for surgery at this time               Will start enoxaparin  and  amiodarone                  (Patient on hydroxychloroquine precludes flecainide)               Metoprolol 75 BID on hold, will restart 25 TID for rate control  Active Hospital Problems    Diagnosis Date Noted    COVID-19 10/21/2020     Priority: 2 - Two     10/21/20 SARS CoV-2 detected      Atrial fibrillation (HCC) 10/31/2020     Noted this AM (10/31/20)  Rate controlled (84 BPM)  No previously documented hx of AFIB  CHADS2VASc = 6  ECHO:  EF 75%< LV 25/44, S/PW 9/10, LAVI 28, Mild MR, Mod TR, RVSP 43      Other partial intestinal obstruction (HCC) 10/31/2020     10/30/20 CT Small bowel obstruction secondary to presumed small bowel containing  umbilical hernia. Stranding within the hernia sac suggesting engorgement. No  pneumatosis.  11/01/20 SURG We will continue to follow the patient to ensure she does not develop worsening incarceration and bowel obstruction from the hernia.  Currently if we can delay the surgery she would likely benefit from decreased risk of postoperative complications      Metabolic encephalopathy 10/21/2020    Contusion of left hip 10/21/2020    Left elbow contusion 10/21/2020    Multiple falls 10/21/2020           ASSESSMENT:  [name] [name] is a 87 y.o. female  with AF  Principal Problem:    Atrial fibrillation (HCC) (10/31/2020)      Overview: ? Noted this AM (10/31/20)      ? Rate controlled (84 BPM)      ? No previously documented hx of AFIB      ? CHADS2VASc = 6      ? ECHO:  EF 75%< LV 25/44, S/PW 9/10, LAVI 28, Mild MR, Mod TR, RVSP 43    Active Problems:    COVID-19 (10/21/2020)      Overview: 10/21/20 SARS CoV-2 detected      Metabolic encephalopathy (10/21/2020)      Contusion of left hip (10/21/2020)      Left elbow contusion (10/21/2020)      Multiple  falls (10/21/2020)      Other partial intestinal obstruction (HCC) (10/31/2020)      Overview: 10/30/20 CT Small bowel obstruction secondary to presumed small bowel       containing      umbilical hernia. Stranding within the hernia sac suggesting engorgement.       No      pneumatosis.      11/01/20 SURG We will continue to follow the patient to ensure she does not       develop worsening incarceration and bowel obstruction from the hernia.        Currently if we can delay the surgery she would likely benefit from       decreased risk of postoperative complications  SUBJECTIVE:  No CP or SOB    VS: Visit Vitals  BP (!) 147/87 (BP 1 Location: Left upper arm, BP Patient Position: Supine)   Pulse (!) 106   Temp 97.3 ??F (36.3 ??C)   Resp 20   Ht 5\' 6"  (1.676 m)   Wt 74.2 kg (163 lb 9.3 oz)   SpO2 94%   Breastfeeding No   BMI 26.40 kg/m??   Patient Vitals for the past 24 hrs:   Temp Pulse Resp BP SpO2   11/01/20 1208 97.3 ??F (36.3 ??C) (!) 106 20 (!) 147/87 --   11/01/20 0834 97.3 ??F (36.3 ??C) 91 20 (!) 142/89 94 %   11/01/20 0706 98.2 ??F (36.8 ??C) (!) 101 18 (!) 142/93 94 %   11/01/20 0235 98.4 ??F (36.9 ??C) (!) 102 18 (!) 132/91 96 %   10/31/20 2140 99.3 ??F (37.4 ??C) 90 20 (!) 140/70 94 %   10/31/20 1641 99 ??F (37.2 ??C) 92 20 (!) 146/89 96 %       Last 3 Recorded Weights in this Encounter    10/30/20 0706 10/31/20 0148 11/01/20 0755   Weight: 76 kg (167 lb 8.8 oz) 74.2 kg (163 lb 9.3 oz) 74.2 kg (163 lb 9.3 oz)     Body mass index is 26.4 kg/m??.     Intake/Output Summary (Last 24 hours) at 11/01/2020 1328  Last data filed at 11/01/2020 11/03/2020  Gross per 24 hour   Intake 1627.5 ml   Output 700 ml   Net 927.5 ml     TELE personally reviewed by me:  AF up to 110    ROS: Positives BOLDED  GEN: fever, chills sweats  CVS: Chest pain, palpitations, DOE, PND, orthopnea, claudication  RESP: epistaxis, SOB, wheezing, productive cough  GI: melena, hematochezia, hematemesis, N/V, diarrhea, constipation  GU: hematuria,  dysuria    EXAM:  General:  Skin warm, perfusion adequate  Neck: JVD is absent, carotids without bruits  Lungs: clear, no rales, no rhonchi   Cardiac:  Irregulary irregular Rhythm, 1/6 SE murmur, Gallops or Rubs  Abdomen: soft, nl bowel sounds, Liver 8cm, eating regular meal  Ext: Edema is absent  Pulses: + 2 symmetrical  Neuro: Alert and oriented; Nonfocal defects    Labs:  Basic Metabolic Profile   Recent Labs     10/31/20  0459 10/30/20  1950   NA 135* 129*   K 3.7 3.7   CL 105 97*   CO2 24 23   AGAP 6 9   GLU 104 130*   BUN 10 10   CREA 0.56 0.58   GFRAA >60.0 >60.0   GFRNA >60 >60   CA 8.2* 8.7          CBC w/Diff    Recent Labs     10/31/20  0459 10/30/20  1950   WBC 9.0 9.6   RBC 3.50* 3.86   HCT 35.7* 39.8   MCV 102.0* 103.1*   MCH 34.0 34.2   MCHC 33.3 33.2    Recent Labs     10/31/20  0459 10/30/20  1950   MONOS 15.4* 13.9*   EOS 1.7 1.2   BASOS 0.4 0.3      @  Lab Results   Component Value Date/Time    Cholesterol, total 113 09/06/2020 03:18 PM    HDL Cholesterol 48 09/06/2020 03:18 PM    LDL, calculated 50 09/06/2020 03:18 PM    Triglyceride 74 10/23/2020 12:11 AM  CHOL/HDL Ratio 2.4 09/06/2020 03:18 PM       Cardiac Enzymes   No results for input(s): CPK, CKMB in the last 72 hours.    No lab exists for component: CKMBINDX, TROPQUANT@troponins @     @  Lab Results   Component Value Date/Time    TSH 9.565 (H) 10/21/2020 12:52 PM    TSH 10.715 (H) 09/24/2020 03:10 PM    TSH 4.207 09/06/2020 03:18 PM    TSH 0.195 (L) 10/21/2018 12:48 PM    TSH 5.270 (H) 08/27/2017 01:13 PM       Coagulation   Recent Labs     10/30/20  1950   INR 1.7*      TROPONIN  .RESLTROPHS:3    Medications:  Current Facility-Administered Medications   Medication Dose Route Frequency    levothyroxine (SYNTHROID) tablet 125 mcg  125 mcg Oral 6am    0.9% sodium chloride with KCl 20 mEq/L infusion   IntraVENous CONTINUOUS    lactulose (CHRONULAC) 10 gram/15 mL solution 30 mL  20 g Oral BID    [Held by provider] metoprolol tartrate  (LOPRESSOR) tablet 75 mg  75 mg Oral BID    [Held by provider] lisinopriL (PRINIVIL, ZESTRIL) tablet 40 mg  40 mg Oral DAILY    rifAXIMin (XIFAXAN) tablet 550 mg  550 mg Oral BID    enoxaparin (LOVENOX) injection 40 mg  40 mg SubCUTAneous Q24H    [Held by provider] furosemide (LASIX) tablet 40 mg  40 mg Oral DAILY    [Held by provider] magnesium oxide (MAG-OX) tablet 400 mg  400 mg Oral DAILY    [Held by provider] melatonin tablet 3 mg  3 mg Oral QHS    [Held by provider] potassium chloride (K-DUR, KLOR-CON M20) SR tablet 40 mEq  40 mEq Oral DAILY    [Held by provider] calcium-vitamin D (OS-CAL +D3) 500 mg-200 unit per tablet 1 Tablet  1 Tablet Oral DAILY WITH LUNCH    hydrOXYchloroQUINE (PLAQUENIL) tablet 200 mg  200 mg Oral BID    [Held by provider] folic acid (FOLVITE) tablet 1 mg  1 mg Oral DAILY       Einar Gip, MD                        November 01, 2020                            1:26 PM    @Ren Grasse  , MD, [facility]  [facility], [facility]  Phone:  [phone](office)  Cell: [phone]  Pager: [phone]@

## 2020-11-01 NOTE — Progress Notes (Signed)
Progress Notes by Elveria Rising, MD at 11/01/20 0915                Author: Elveria Rising, MD  Service: SURGERY  Author Type: Physician       Filed: 11/01/20 0917  Date of Service: 11/01/20 0915  Status: Signed          Editor: Elveria Rising, MD (Physician)                                   Acute Care Surgery Progress note   November 01, 2020             Patient: [name] [name]  Age: 87 y.o.  Sex: female          Date of Birth: [birth year]  Admit Date: 10/21/2020  Admit Doctor: [name], MD         MRN: [id number]   CSN: [id number]   PCP: [name], MD        Admit Date: 10/21/2020    LOS: 11 days          INTERVAL EVENTS: No acute events overnight.  Patient reports no worsening abdominal pain.  She does not have any nausea or vomiting.          Assessment:        Principal Problem:     Atrial fibrillation (HCC) (10/31/2020)      Active Problems:     Metabolic encephalopathy (10/21/2020)        Contusion of left hip (10/21/2020)        Left elbow contusion (10/21/2020)        Multiple falls (10/21/2020)        Other partial intestinal obstruction (HCC) (10/31/2020)               Diet: Full liquid diet   Abx:      Inpat Anti-Infectives (From admission,  onward)               Start        Ordered  Stop        10/25/20 1200    cephALEXin (KEFLEX) capsule 500 mg  500 mg,   Oral,   EVERY 6 HOURS             10/25/20 1043  10/28/20 1159        10/24/20 1200    rifAXIMin (XIFAXAN) tablet 550 mg  550 mg,   Oral,   2 TIMES DAILY             10/24/20 1047  --        10/21/20 2100    hydrOXYchloroQUINE (PLAQUENIL) tablet 200 mg  200 mg,   Oral,   2 TIMES DAILY            Note to Pharmacy: OP SWF:[id number] 200 mg by mouth two (2) times a day.          10/21/20 1905  --                                          Prophylaxis: Lovenox 40 mg daily for DVT prophylaxis      [name] [name] is a 87 y.o. female with multiple medical comorbidities including current COVID positivity with a periumbilical hernia with  a small portion of  bowel resulting in a partial bowel obstruction.   I believe I was able to reduce the majority of this and contrast progressed through the area into the colon without issue.  I discussed the patient with Dr. Caralyn Guile and believe that the patient will benefit from interval hernia repair after resolution  of her COVID and improvement of her encephalopathy.           Plan:           We will continue to follow the patient to ensure she does not develop worsening incarceration and bowel obstruction from the hernia.  Currently if we can delay the surgery she would likely benefit  from decreased risk of postoperative complications.      Thank you for allowing [place] to participate in the care of your patient.                 Subjective:        No acute events overnight.  Atrial fibrillation being managed by cardiology           Objective:          Vitals:             11/01/20 0235  11/01/20 0706  11/01/20 0755  11/01/20 0834           BP:  (!) 132/91  (!) 142/93    (!) 142/89     Pulse:  (!) 102  (!) 101    91     Resp:  18  18    20      Temp:  98.4 ??F (36.9 ??C)  98.2 ??F (36.8 ??C)    97.3 ??F (36.3 ??C)     SpO2:  96%  94%    94%     Weight:      74.2 kg (163 lb 9.3 oz)             Height:                    Intake and Output:   Current Shift: 08/19 0701 - 08/19 1900   In: 150 [P.O.:150]   Out: 700 [Urine:700]   Last three shifts: 08/17 1901 - 08/19 0700   In: 1707.5 [P.O.:180; I.V.:1527.5]   Out: 1600 [Urine:1600]      Physical Exam:    GENERAL:  patient resting in bed in NAD, appears age stated, cooperative    HEENT:  sclera anicteric, normal cephalic, atraumatic    NECK:  supple; no appreciable JVD    CHEST: Irregularly irregular, regular rate   LUNGS:  No increased work of breathing.  Equal chest expansion bilaterally.    ABD: Soft, nontender, nondistended, periumbilical hernia with some overlying erythema and tender to palpation, no worse than yesterday   EXT:  Warm.  No clubbing.  No cyanosis, swelling or edema.     SKIN:  No rash     NEURO:  awake, alert and oriented.  Moves all 4 extremities.    PSYCH:  normal affect          Lab/Data Review:       No results found for this or any previous visit (from the past 12 hour(s)).         Imaging Review:     No results found.      Labs and imaging personally reviewed and my findings are as follows: No new labs or imaging today  Elige Radon, MD, [facility]      For questions or concerns please contact the Acute Care Surgery Team  by dedicated pager: [phone]

## 2020-11-01 NOTE — Progress Notes (Signed)
 NUTRITION RECOMMENDATIONS:   Recommend 2 g Na, Easy to [employer]. Diet consistency per MD.    Recommend Ensure Enlive BID (350 kcals, 20 g protein each).  Daily wts to trend.   Discharge recommendations: 2 g Na    NUTRITION FOLLOW UP    Current Diet Order: ADULT DIET Easy to Chew    Current Intake: []  N/A- NPO    []  very poor     [x]  poor      []  fair      []  good  Pt on cardiac diet 8/8-8/14. Clear Liquid diet 8/15-8/17. Full Liquid 8/18. Advanced to easy to chew diet 8/19. Last meal documented on 8/17 of 1-25%. Suspect PO intake is poor related to diet restrictions. Will add oral nutrition supplements.      Pertinent Medications: NS with KCl @ 40 mL/hr, cordarone, lactulose, synthroid (FDI), lopressor     Pertinent Labs: 8/18 Na 135 L, Ca 8.2 (no albumin to correct0, Mag 1.4 L     Weight: Initial admit wt of 76.2 kg (8/8). Lowest BW this admit of 74.2 kg (8/19). -2.6% x 11 days. Pt currently +2.5 L per I/O. Will continue to monitor weight trends.   Last 3 Recorded Weights in this Encounter    10/30/20 0706 10/31/20 0148 11/01/20 0755   Weight: 76 kg (167 lb 8.8 oz) 74.2 kg (163 lb 9.3 oz) 74.2 kg (163 lb 9.3 oz)       BMI: Body mass index is 26.4 kg/m.    Estimated Daily Nutrition Needs: 74.2 kg CBW  1632 - 2003 kcals (22-27 kcal/kg)  89 - 111 g protein (1.2-1.5 g/kg)  1855 mL fluid (25 ml/kg or per MD)    Physical Assessment:  GI Symptoms:   periumbilical hernia with a small portion of bowel resulting in a partial bowel obstruction per Surgery   +cirrhosis  Last Bowel Movement Date: 10/31/20  Stool Appearance: Loose  Abdominal Assessment: Other (comment) (umbilicus)  Bowel Sounds: Active   Chewing/Swallowing Issues:   On Easy to Chew per MD  Skin Integrity:   Stage 2 PI to buttocks per flowsheets  Fluid Accumulation:   Edema  LLE: Trace  RLE: Trace    Physical Assessment:   Muscle Wasting:  Pt not seen at this time in attempt to reduce unecessary exposure, pt COVID + Fat Wasting:  Pt not seen at this time in  attempt to reduce unecessary exposure, pt COVID +     Assessment of Current MNT: Diet is adequate if intake averages 75% at most meals - recommend adding oral supplement BOD to increase protein-calorie intake opportunity.      Nutrition Diagnosis:   Inadequate oral intake related to poor appetite/diet restrictions as evidenced by <75% of estimated needs throughout admit x 11 days. (Continues)     Increased nutrient needs (kcal, protein) related to metabolic demand for catabolic illness / wound healing  as evidenced by COVID-19, cirrhosis, stage 2 PI to buttocks. (Updated)    Nutrition Recommendation:   Recommend 2 g Na, Easy to [employer]. Diet consistency per MD.    Recommend Ensure Enlive BID (350 kcals, 20 g protein each).  Daily wts to trend.   Discharge recommendations: 2 g Na    Monitoring and Evaluation: Wt trend, PO intake, nutrition-related labs, and s/sx of new skin concerns; will f/u per policy.    Nutrition Goals: Pt to meet/tolerate >75% of estimated needs, maintain weight throughout LOS, BG control <180, improvement skin integrity.  Nutrition Level of Care: []  Low     []  Moderate     [x]  High    Progress Towards Nutrition Goals: []   Met/Ongoing     []   Progressing Appropriately     [x]   Progressing Slowly     []   Not Progressing    Code Status: Full Code       Sid Cork, MS, RD  Pager: [phone]  Office: [phone]

## 2020-11-02 LAB — METABOLIC PANEL, COMPREHENSIVE
ALT (SGPT): <7 U/L — ABNORMAL LOW (ref 10–49)
AST (SGOT): 20 U/L (ref 0.0–33.9)
Albumin: 1.5 gm/dl — ABNORMAL LOW (ref 3.4–5.0)
Alk. phosphatase: 128 U/L — ABNORMAL HIGH (ref 46–116)
Anion gap: 9 mmol/L (ref 5–15)
BUN: 6 mg/dl — ABNORMAL LOW (ref 9–23)
Bilirubin, total: 1.5 mg/dl — ABNORMAL HIGH (ref 0.30–1.20)
CO2: 22 mEq/L (ref 20–31)
Calcium: 8 mg/dl — ABNORMAL LOW (ref 8.7–10.4)
Chloride: 105 mEq/L (ref 98–107)
Creatinine: 0.49 mg/dl — ABNORMAL LOW (ref 0.55–1.02)
GFR est AA: >60
GFR est non-AA: >60
Glucose: 119 mg/dl — ABNORMAL HIGH (ref 74–106)
Potassium: 3.7 mEq/L (ref 3.5–5.1)
Protein, total: 5.6 gm/dl — ABNORMAL LOW (ref 5.7–8.2)
Sodium: 135 mEq/L — ABNORMAL LOW (ref 136–145)

## 2020-11-02 LAB — CBC WITH AUTOMATED DIFF
BASOPHILS: 0.6 % (ref 0–3)
EOSINOPHILS: 1.8 % (ref 0–5)
HCT: 38.1 % (ref 37.0–50.0)
HGB: 12.5 gm/dl — ABNORMAL LOW (ref 13.0–17.2)
IMMATURE GRANULOCYTES: 2.6 % (ref 0.0–3.0)
LYMPHOCYTES: 18 % — ABNORMAL LOW (ref 28–48)
MCH: 34 pg (ref 25.4–34.6)
MCHC: 32.8 gm/dl (ref 30.0–36.0)
MCV: 103.5 fL — ABNORMAL HIGH (ref 80.0–98.0)
MONOCYTES: 12.1 % (ref 1–13)
MPV: 11.8 fL — ABNORMAL HIGH (ref 6.0–10.0)
NEUTROPHILS: 64.9 % — ABNORMAL HIGH (ref 34–64)
NRBC: 0 (ref 0–0)
PLATELET: 272 10*3/uL (ref 140–450)
RBC: 3.68 M/uL (ref 3.60–5.20)
RDW-SD: 52 — ABNORMAL HIGH (ref 36.4–46.3)
WBC: 10 10*3/uL (ref 4.0–11.0)

## 2020-11-02 LAB — MAGNESIUM
Magnesium: 1.7 mg/dL (ref 1.6–2.6)
Magnesium: 1.7 mg/dL (ref 1.6–2.6)

## 2020-11-02 LAB — AMMONIA
Ammonia, plasma: 47 umol/L — ABNORMAL HIGH (ref 11.2–31.7)
Ammonia: 47 umol/L — ABNORMAL HIGH (ref 11.2–31.7)

## 2020-11-02 LAB — CULTURE, URINE: Isolate: <10000 — AB

## 2020-11-02 LAB — CBC WITH AUTO DIFFERENTIAL
Basophils %: 0.6 % (ref 0–3)
Eosinophils %: 1.8 % (ref 0–5)
Hematocrit: 38.1 % (ref 37.0–50.0)
Hemoglobin: 12.5 gm/dl — ABNORMAL LOW (ref 13.0–17.2)
Immature Granulocytes: 2.6 % (ref 0.0–3.0)
Lymphocytes %: 18 % — ABNORMAL LOW (ref 28–48)
MCH: 34 pg (ref 25.4–34.6)
MCHC: 32.8 gm/dl (ref 30.0–36.0)
MCV: 103.5 fL — ABNORMAL HIGH (ref 80.0–98.0)
MPV: 11.8 fL — ABNORMAL HIGH (ref 6.0–10.0)
Monocytes %: 12.1 % (ref 1–13)
Neutrophils %: 64.9 % — ABNORMAL HIGH (ref 34–64)
Nucleated RBCs: 0 (ref 0–0)
Platelets: 272 10*3/uL (ref 140–450)
RBC: 3.68 M/uL (ref 3.60–5.20)
RDW-SD: 52 — ABNORMAL HIGH (ref 36.4–46.3)
WBC: 10 10*3/uL (ref 4.0–11.0)

## 2020-11-02 LAB — COMPREHENSIVE METABOLIC PANEL
ALT: <7 U/L — ABNORMAL LOW (ref 10–49)
AST: 20 U/L (ref 0.0–33.9)
Albumin: 1.5 gm/dl — ABNORMAL LOW (ref 3.4–5.0)
Alkaline Phosphatase: 128 U/L — ABNORMAL HIGH (ref 46–116)
Anion Gap: 9 mmol/L (ref 5–15)
BUN: 6 mg/dl — ABNORMAL LOW (ref 9–23)
CO2: 22 mEq/L (ref 20–31)
Calcium: 8 mg/dl — ABNORMAL LOW (ref 8.7–10.4)
Chloride: 105 mEq/L (ref 98–107)
Creatinine: 0.49 mg/dl — ABNORMAL LOW (ref 0.55–1.02)
EGFR IF NonAfrican American: >60
GFR African American: >60
Glucose: 119 mg/dl — ABNORMAL HIGH (ref 74–106)
Potassium: 3.7 mEq/L (ref 3.5–5.1)
Sodium: 135 mEq/L — ABNORMAL LOW (ref 136–145)
Total Bilirubin: 1.5 mg/dl — ABNORMAL HIGH (ref 0.30–1.20)
Total Protein: 5.6 gm/dl — ABNORMAL LOW (ref 5.7–8.2)

## 2020-11-02 MED ORDER — POTASSIUM CHLORIDE SR 20 MEQ TAB, PARTICLES/CRYSTALS
20 mEq | Freq: Once | ORAL | Status: AC
Start: 2020-11-02 — End: 2020-11-02
  Administered 2020-11-02: 18:00:00 via ORAL

## 2020-11-02 MED ORDER — MAGNESIUM OXIDE 400 MG TAB
400 mg | Freq: Once | ORAL | Status: AC
Start: 2020-11-02 — End: 2020-11-02
  Administered 2020-11-02: 18:00:00 via ORAL

## 2020-11-02 MED ORDER — LACTULOSE 10 GRAM/15 ML ORAL SOLUTION
10 gram/15 mL | Freq: Two times a day (BID) | ORAL | Status: DC
Start: 2020-11-02 — End: 2020-11-05
  Administered 2020-11-03 – 2020-11-05 (×6): via ORAL

## 2020-11-02 MED FILL — METOPROLOL TARTRATE 25 MG TAB: 25 mg | ORAL | Qty: 1

## 2020-11-02 MED FILL — HYDROXYCHLOROQUINE 200 MG TAB: 200 mg | ORAL | Qty: 1

## 2020-11-02 MED FILL — XIFAXAN 550 MG TABLET: 550 mg | ORAL | Qty: 1

## 2020-11-02 MED FILL — MAGNESIUM OXIDE 400 MG TAB: 400 mg | ORAL | Qty: 1

## 2020-11-02 MED FILL — AMIODARONE 200 MG TAB: 200 mg | ORAL | Qty: 2

## 2020-11-02 MED FILL — POTASSIUM CHLORIDE SR 20 MEQ TAB, PARTICLES/CRYSTALS: 20 mEq | ORAL | Qty: 2

## 2020-11-02 MED FILL — NS WITH POTASSIUM CHLORIDE 20 MEQ/L IV: 20 mEq/L | INTRAVENOUS | Qty: 1000

## 2020-11-02 MED FILL — ENOXAPARIN 300 MG/3 ML VIAL: 300 mg/3 mL | SUBCUTANEOUS | Qty: 0.7

## 2020-11-02 MED FILL — LACTULOSE 20 GRAM/30 ML ORAL SOLUTION: 20 gram/30 mL | ORAL | Qty: 30

## 2020-11-02 MED FILL — TYLENOL 325 MG TABLET: 325 mg | ORAL | Qty: 2

## 2020-11-02 MED FILL — LEVOTHYROXINE 125 MCG TAB: 125 mcg | ORAL | Qty: 1

## 2020-11-02 NOTE — Progress Notes (Signed)
Problem: Airway Clearance - Ineffective  Goal: Achieve or maintain patent airway  11/02/2020 1737 by Darryl Lent, RN  Outcome: Progressing Towards Goal  11/02/2020 1737 by Darryl Lent, RN  Outcome: Progressing Towards Goal     Problem: Gas Exchange - Impaired  Goal: Absence of hypoxia  11/02/2020 1737 by Darryl Lent, RN  Outcome: Progressing Towards Goal  11/02/2020 1737 by Darryl Lent, RN  Outcome: Progressing Towards Goal  Goal: Promote optimal lung function  11/02/2020 1737 by Darryl Lent, RN  Outcome: Progressing Towards Goal  11/02/2020 1737 by Darryl Lent, RN  Outcome: Progressing Towards Goal     Problem: Gas Exchange - Impaired  Goal: Promote optimal lung function  11/02/2020 1737 by Darryl Lent, RN  Outcome: Progressing Towards Goal  11/02/2020 1737 by Darryl Lent, RN  Outcome: Progressing Towards Goal     Problem: Breathing Pattern - Ineffective  Goal: Ability to achieve and maintain a regular respiratory rate  11/02/2020 1737 by Darryl Lent, RN  Outcome: Progressing Towards Goal  11/02/2020 1737 by Darryl Lent, RN  Outcome: Progressing Towards Goal

## 2020-11-02 NOTE — Progress Notes (Signed)
Progress Note             Daily Progress Note: 11/02/2020    Assessment/Plan:        Appreciate surgery and cardiology assistance; notes reviewed, med changes noted.       Pt with hallucinations again this am.   Oriented to year, month, able to identify President.        Denies sob, chest pain.   Tolerating soft diet, no N/V, abd pain.   Had one loose BM yesterday.    Afebrile.   AF rate better in 70's-80's.   Few rales.   Hernia non-tender, bruising improved.         Ammonia 47.   WBCs nl.         Umbilical hernia.   Continuing soft diet today.   DC IV fluids.              (S/p sigmoid resection and bladder repair 06/2003, prior TAH, then subsequent BSO).  2.   New onset atrial fibrillation with CVR.   As per CVAL.        3.   Acute Covid-19 infection.   Minimally symptomatic.with mild cough.   Categorized  higher risk with 20 day isolation per hospital's protocol.   ( High viral titer and % O2 sats < 94%).  4.   Cirrhosis of liver (Hemochromatosis, Methotrexate for several years for RA) with hepatic encephalopathy and mild auto-anticoagulation (INR 1.4 on admission).    Trial increase Lactulose if doesn't create too much diarrhea.    Continue dual tx with Xifaxin for now.  5.   Multiple falls, 2 within 24 hrs of admission.    Hx multiple prior orthopedic surgeries.  6.   Hypothyroidism, inadequate replacement.   Dose increased from 100 to 125 mcg this admission.    7.    Hemochromatosis receiving periodic phlebotomies (~ 2/year) via Dr. Keturah Barre.    8.   RA, on Methotrexate approximately the last five years, DC'd about a month ago.  Continue Plaquenil.   Would have to wait a few weeks for a new DMARD with #1.   (Dr. Kalman Jewels)  9.   Hx subdural hematoma 08/2017, not requiring surgery.  10.  HTN, currently normotensive off meds.  11.   Borderline K+, Mg++.   PO dose x 1 today.  12.   RLL lung nodules.   Eventual CT chest.       A tentative overall plan for patient, subject to change, would be rehab after hospitalization  for patient to gain muscular strength, hopefully improve her nutritional status, move further away from acute Covid-19 infection, refer to gen surgery for pseudo-elective repair of abdominal hernia.                 Subjective:      Review of Systems:  ROS    Objective:   Physical Exam: Physical Exam  Cardiovascular:      Rate and Rhythm: Normal rate. Rhythm irregular.   Pulmonary:      Effort: Pulmonary effort is normal.      Comments: Few rales  Abdominal:      Tenderness: There is no guarding or rebound.      Hernia: A hernia is present.   Musculoskeletal:      Right lower leg: No edema.      Left lower leg: No edema.   Neurological:      Mental Status: She is alert.  Visit Vitals  BP (!) 142/79 (BP 1 Location: Right upper arm, BP Patient Position: Supine)   Pulse 70   Temp 97.4 ??F (36.3 ??C)   Resp 17   Ht '5\' 6"'  (1.676 m)   Wt 74.2 kg (163 lb 9.3 oz)   SpO2 96%   Breastfeeding No   BMI 26.40 kg/m??    O2 Flow Rate (L/min): 2 l/min O2 Device: None (Room air)    Temp (24hrs), Avg:97.5 ??F (36.4 ??C), Min:97.3 ??F (36.3 ??C), Max:97.8 ??F (36.6 ??C)    08/20 0701 - 08/20 1900  In: -   Out: 400 [Urine:400]   08/18 1901 - 08/20 0700  In: 550 [P.O.:550]  Out: 700 [Urine:700]      Data Review:       24 Hour Results:  Recent Results (from the past 24 hour(s))   AMMONIA    Collection Time: 11/02/20  1:55 AM   Result Value Ref Range    Ammonia, plasma 47.0 (H) 11.2 - 78.2 umol/L   METABOLIC PANEL, COMPREHENSIVE    Collection Time: 11/02/20  1:55 AM   Result Value Ref Range    Potassium 3.7 3.5 - 5.1 mEq/L    Chloride 105 98 - 107 mEq/L    Sodium 135 (L) 136 - 145 mEq/L    CO2 22 20 - 31 mEq/L    Glucose 119 (H) 74 - 106 mg/dl    BUN 6 (L) 9 - 23 mg/dl    Creatinine 0.49 (L) 0.55 - 1.02 mg/dl    GFR est AA >60.0      GFR est non-AA >60      Calcium 8.0 (L) 8.7 - 10.4 mg/dl    Anion gap 9 5 - 15 mmol/L    AST (SGOT) 20.0 0.0 - 33.9 U/L    ALT (SGPT) <7 (L) 10 - 49 U/L    Alk. phosphatase 128 (H) 46 - 116 U/L    Bilirubin, total  1.50 (H) 0.30 - 1.20 mg/dl    Protein, total 5.6 (L) 5.7 - 8.2 gm/dl    Albumin 1.5 (L) 3.4 - 5.0 gm/dl   MAGNESIUM    Collection Time: 11/02/20  1:55 AM   Result Value Ref Range    Magnesium 1.7 1.6 - 2.6 mg/dL   CBC WITH AUTOMATED DIFF    Collection Time: 11/02/20  1:55 AM   Result Value Ref Range    WBC 10.0 4.0 - 11.0 1000/mm3    RBC 3.68 3.60 - 5.20 M/uL    HGB 12.5 (L) 13.0 - 17.2 gm/dl    HCT 38.1 37.0 - 50.0 %    MCV 103.5 (H) 80.0 - 98.0 fL    MCH 34.0 25.4 - 34.6 pg    MCHC 32.8 30.0 - 36.0 gm/dl    PLATELET 272 140 - 450 1000/mm3    MPV 11.8 (H) 6.0 - 10.0 fL    RDW-SD 52.0 (H) 36.4 - 46.3      NRBC 0 0 - 0      IMMATURE GRANULOCYTES 2.6 0.0 - 3.0 %    NEUTROPHILS 64.9 (H) 34 - 64 %    LYMPHOCYTES 18.0 (L) 28 - 48 %    MONOCYTES 12.1 1 - 13 %    EOSINOPHILS 1.8 0 - 5 %    BASOPHILS 0.6 0 - 3 %             Problem List:  Problem List as of 11/02/2020 Date Reviewed: 10/31/2020  Codes Class Noted - Resolved    [id number] ICD-10-CM: U07.1  ICD-9-CM: 079.89  10/21/2020 - Present    Overview Signed 11/01/2020  1:27 PM by Lillia Dallas, MD     10/21/20 SARS CoV-2 detected             * (Principal) Atrial fibrillation [facility]) ICD-10-CM: I48.91  ICD-9-CM: 427.31  10/31/2020 - Present    Overview Signed 11/01/2020  1:18 PM by Lillia Dallas, MD     Noted this AM (10/31/20)  Rate controlled (84 BPM)  No previously documented hx of AFIB  CHADS2VASc = 6  ECHO:  EF 75%< LV 25/44, S/PW 9/10, LAVI 28, Mild MR, Mod TR, RVSP 43             Other partial intestinal obstruction ([facility]) ICD-10-CM: K56.690  ICD-9-CM: 560.89  10/31/2020 - Present    Overview Addendum 11/01/2020  1:28 PM by Lillia Dallas, MD     10/30/20 CT Small bowel obstruction secondary to presumed small bowel containing  umbilical hernia. Stranding within the hernia sac suggesting engorgement. No  pneumatosis.  11/01/20 SURG We will continue to follow the patient to ensure she does not develop worsening incarceration and bowel obstruction from the hernia.   Currently if we can delay the surgery she would likely benefit from decreased risk of postoperative complications             Metabolic encephalopathy KDX-83-JA: G93.41  ICD-9-CM: 348.31  10/21/2020 - Present        Contusion of left hip ICD-10-CM: S70.02XA  ICD-9-CM: 924.01  10/21/2020 - Present        Left elbow contusion ICD-10-CM: S50.02XA  ICD-9-CM: 923.11  10/21/2020 - Present        Multiple falls ICD-10-CM: R29.6  ICD-9-CM: V15.88  10/21/2020 - Present        Deep venous thrombosis (HCC) ICD-10-CM: I82.409  ICD-9-CM: 453.40  Unknown - Present        Subdural hematoma caused by concussion [facility]) ICD-10-CM: S50.5L9J  ICD-9-CM: 852.29  08/27/2017 - Present        Subdural hematoma (HCC) ICD-10-CM: Q73.4L9F  ICD-9-CM: 432.1  08/27/2017 - Present       Medications reviewed  Current Facility-Administered Medications   Medication Dose Route Frequency    metoprolol tartrate (LOPRESSOR) tablet 25 mg  25 mg Oral Q8H    amiodarone (CORDARONE) tablet 400 mg  400 mg Oral Q8H    enoxaparin (LOVENOX) injection 70 mg  1 mg/kg SubCUTAneous Q12H    levothyroxine (SYNTHROID) tablet 125 mcg  125 mcg Oral 6am    0.9% sodium chloride with KCl 20 mEq/L infusion   IntraVENous CONTINUOUS    lactulose (CHRONULAC) 10 gram/15 mL solution 30 mL  20 g Oral BID    [Held by provider] lisinopriL (PRINIVIL, ZESTRIL) tablet 40 mg  40 mg Oral DAILY    rifAXIMin (XIFAXAN) tablet 550 mg  550 mg Oral BID    [Held by provider] furosemide (LASIX) tablet 40 mg  40 mg Oral DAILY    albuterol-ipratropium (DUO-NEB) 2.5 MG-0.5 MG/3 ML  3 mL Nebulization Q4H PRN    [Held by provider] magnesium oxide (MAG-OX) tablet 400 mg  400 mg Oral DAILY    [Held by provider] melatonin tablet 3 mg  3 mg Oral QHS    [Held by provider] potassium chloride (K-DUR, KLOR-CON M20) SR tablet 40 mEq  40 mEq Oral DAILY    [Held by provider] calcium-vitamin D (OS-CAL +D3) 500 mg-200  unit per tablet 1 Tablet  1 Tablet Oral DAILY WITH LUNCH    hydrOXYchloroQUINE (PLAQUENIL) tablet 200 mg   200 mg Oral BID    [Held by provider] folic acid (FOLVITE) tablet 1 mg  1 mg Oral DAILY    naloxone (NARCAN) injection 0.1 mg  0.1 mg IntraVENous PRN    acetaminophen (TYLENOL) tablet 650 mg  650 mg Oral Q4H PRN    Or    acetaminophen (TYLENOL) solution 650 mg  650 mg Oral Q4H PRN    Or    acetaminophen (TYLENOL) suppository 650 mg  650 mg Rectal Q4H PRN    [Held by provider] HYDROcodone-acetaminophen (NORCO) 5-325 mg per tablet 1 Tablet  1 Tablet Oral Q6H PRN    ondansetron (ZOFRAN) injection 4 mg  4 mg IntraVENous Q4H PRN       Care Plan discussed with: Patient/Family    Total time spent with patient: 40 minutes.    Colen Darling, MD  November 02, 2020

## 2020-11-02 NOTE — Progress Notes (Signed)
Progress  Notes by Robin Searing, MD at 11/02/20 1200                Author: Robin Searing, MD  Service: SURGERY  Author Type: Physician       Filed: 11/02/20 1204  Date of Service: 11/02/20 1200  Status: Signed          Editor: Robin Searing, MD (Physician)                       [facility]   [phone]      PROGRESS NOTE      Today's date: November 02, 2020   Patient: [name] [name]                  Sex: female         Date of Birth:  04-21-1933      Age:  87 y.o.           Date of admission: 10/21/2020   LOS:  LOS: 12 days           Assessment/Plan     [name] [name] is a 87 y.o. female with an incarcerated ventral hernia, partially reduced without any current obstructive symptoms in the setting of active COVID-19 infection.  Tolerating  a regular diet.  No imminent operative plans, would likely benefit from delayed intervention in the absence of re-developing obstruction.      - Continue DVT/Pulmonary prophylaxis.   - Ambulate        Subjective     Feels well, tolerating diet, seeing mice on coat hooks inside the room which are not evident to me.  She is aware that I cannot see them and not particularly bothered by it        Vitals     Patient Vitals for the past 24 hrs:            Temp  Pulse  Resp  BP  SpO2            11/02/20 1146  --  70  --  --  --            11/02/20 1135  97.4 ??F (36.3 ??C)  70  17  (!) 142/79  96 %     11/02/20 0732  97.5 ??F (36.4 ??C)  71  16  (!) 146/82  96 %     11/02/20 0435  97.8 ??F (36.6 ??C)  84  19  128/66  96 %     11/02/20 0000  --  88  --  --  --     11/01/20 2308  97.4 ??F (36.3 ??C)  88  20  138/78  97 %     11/01/20 2000  --  88  --  --  --     11/01/20 1711  97.3 ??F (36.3 ??C)  85  24  126/82  96 %            11/01/20 1208  97.3 ??F (36.3 ??C)  (!) 106  20  (!) 147/87  --             Ins/Outs        Intake/Output Summary (Last 24 hours) at 11/02/2020 1201   Last data filed at 11/02/2020 0833     Gross per 24 hour        Intake  400 ml        Output  400  ml         Net  0 ml             Examination      Gen: Conversant, alert, oriented. No acute distress.    MSE: Grossly intact    Pulmonary: Good air movement, no retractions    Cardiac: Normal rate, regular rhythm    Abdomen:  Soft, with mild tenderness at the hernia site.  Skin is pink, temperature warm              Labs:  Results:        Chemistry  Recent Labs         11/02/20   0155  10/31/20   0459  10/30/20   1950      GLU  119*  104  130*      NA  135*  135*  129*      K  3.7  3.7  3.7      CL  105  105  97*      CO2  22  24  23       BUN  6*  10  10      CREA  0.49*  0.56  0.58      CA  8.0*  8.2*  8.7      AGAP  9  6  9       AP  128*   --   94      TP  5.6*   --   5.4*      ALB  1.5*   --   1.5*          Estimated Creatinine Clearance: 60.6 mL/min (A) (by C-G formula based on SCr of 0.49 mg/dL (L)).     CBC w/Diff  Recent Labs         11/02/20   0155  10/31/20   0459  10/30/20   1950      WBC  10.0  9.0  9.6      RBC  3.68  3.50*  3.86      HGB  12.5*  11.9*  13.2      HCT  38.1  35.7*  39.8      PLT  272  237  233      GRANS  64.9*  62.8  69.4*      LYMPH  18.0*  18.4*  13.8*      EOS  1.8  1.7  1.2           Coagulation  Recent Labs         10/30/20   1950      PTP  19.9*      INR  1.7*            Liver Enzymes  Recent Labs         11/02/20   0155      TP  5.6*      ALB  1.5*      AP  128*      ALT  <7*              Pathology  pathology                Imaging     No results found.            11/01/20, MD FACS   Acute Care Surgery Team   [facility]  [facility]   [address], [address]   [place], [place]  [address]   [phone]   Surgicalist Pager: [phone]  November 02, 2020   12:01 PM      Dragon medical dictation software was used for portions of this report. Unintended voice recognition grammatical errors may occur.

## 2020-11-02 NOTE — Progress Notes (Signed)
Problem: Airway Clearance - Ineffective  Goal: Achieve or maintain patent airway  Outcome: Progressing Towards Goal     Problem: Gas Exchange - Impaired  Goal: Absence of hypoxia  Outcome: Progressing Towards Goal  Goal: Promote optimal lung function  Outcome: Progressing Towards Goal     Problem: Breathing Pattern - Ineffective  Goal: Ability to achieve and maintain a regular respiratory rate  Outcome: Progressing Towards Goal     Problem: Body Temperature -  Risk of, Imbalanced  Goal: Ability to maintain a body temperature within defined limits  Outcome: Progressing Towards Goal  Goal: Will regain or maintain usual level of consciousness  Outcome: Progressing Towards Goal  Goal: Complications related to the disease process, condition or treatment will be avoided or minimized  Outcome: Progressing Towards Goal

## 2020-11-03 MED FILL — XIFAXAN 550 MG TABLET: 550 mg | ORAL | Qty: 1

## 2020-11-03 MED FILL — AMIODARONE 200 MG TAB: 200 mg | ORAL | Qty: 2

## 2020-11-03 MED FILL — LACTULOSE 20 GRAM/30 ML ORAL SOLUTION: 20 gram/30 mL | ORAL | Qty: 60

## 2020-11-03 MED FILL — METOPROLOL TARTRATE 25 MG TAB: 25 mg | ORAL | Qty: 1

## 2020-11-03 MED FILL — LOVENOX 300 MG/3 ML SUBCUTANEOUS SOLUTION: 300 mg/3 mL | SUBCUTANEOUS | Qty: 0.7

## 2020-11-03 MED FILL — ENOXAPARIN 300 MG/3 ML VIAL: 300 mg/3 mL | SUBCUTANEOUS | Qty: 0.7

## 2020-11-03 MED FILL — LEVOTHYROXINE 125 MCG TAB: 125 mcg | ORAL | Qty: 1

## 2020-11-03 MED FILL — HYDROXYCHLOROQUINE 200 MG TAB: 200 mg | ORAL | Qty: 1

## 2020-11-03 NOTE — Progress Notes (Signed)
Problem: Falls - Risk of  Goal: *Absence of Falls  Description: Document [name] [name] Fall Risk and appropriate interventions in the flowsheet.  Outcome: Progressing Towards Goal  Note: Fall Risk Interventions:  Mobility Interventions: Bed/chair exit alarm, Patient to call before getting OOB    Mentation Interventions: Bed/chair exit alarm    Medication Interventions: Bed/chair exit alarm, Patient to call before getting OOB, Teach patient to arise slowly    Elimination Interventions: Bed/chair exit alarm, Call light in reach    History of Falls Interventions: Bed/chair exit alarm         Problem: Pressure Injury - Risk of  Goal: *Prevention of pressure injury  Description: Document Braden Scale and appropriate interventions in the flowsheet.  Outcome: Progressing Towards Goal  Note: Pressure Injury Interventions:  Sensory Interventions: Keep linens dry and wrinkle-free, Maintain/enhance activity level, Minimize linen layers, Monitor skin under medical devices    Moisture Interventions: Absorbent underpads    Activity Interventions: Assess need for specialty bed, Pressure redistribution bed/mattress(bed type)    Mobility Interventions: Pressure redistribution bed/mattress (bed type)    Nutrition Interventions: Document food/fluid/supplement intake    Friction and Shear Interventions: HOB 30 degrees or less

## 2020-11-03 NOTE — Progress Notes (Signed)
Problem: Isolation Precautions - Risk of Spread of Infection  Goal: Prevent transmission of infectious organism to others  Outcome: Progressing Towards Goal     Problem: Pressure Injury - Risk of  Goal: *Prevention of pressure injury  Description: Document Braden Scale and appropriate interventions in the flowsheet.  Outcome: Progressing Towards Goal  Note: Pressure Injury Interventions:  Sensory Interventions: Maintain/enhance activity level, Minimize linen layers, Pad between skin to skin, Pressure redistribution bed/mattress (bed type)    Moisture Interventions: Absorbent underpads    Activity Interventions: Pressure redistribution bed/mattress(bed type), PT/OT evaluation    Mobility Interventions: Pressure redistribution bed/mattress (bed type), PT/OT evaluation    Nutrition Interventions: Document food/fluid/supplement intake    Friction and Shear Interventions: Apply protective barrier, creams and emollients                Problem: Falls - Risk of  Goal: *Absence of Falls  Description: Document Schmid Fall Risk and appropriate interventions in the flowsheet.  Outcome: Progressing Towards Goal  Note: Fall Risk Interventions:  Mobility Interventions: Bed/chair exit alarm, Patient to call before getting OOB    Mentation Interventions: Adequate sleep, hydration, pain control, Bed/chair exit alarm    Medication Interventions: Bed/chair exit alarm, Patient to call before getting OOB, Teach patient to arise slowly    Elimination Interventions: Bed/chair exit alarm, Call light in reach    History of Falls Interventions: Bed/chair exit alarm

## 2020-11-03 NOTE — Progress Notes (Signed)
Progress Notes by Tyson Alias, K. Shahid, MD at 11/03/20 1329                Author: Tyson Alias, Kirtland Bouchard. Shahid, MD  Service: Cardiology  Author Type: Physician       Filed: 11/03/20 1344  Date of Service: 11/03/20 1329  Status: Signed          Editor: Tyson Alias, K. Shahid, MD (Physician)                       CARDIOLOGY PROGRESS NOTE         NAME: [name] [name]      MRN: [id number]   DOB: May 11, 1933      PCP: [name], MD        ASSESSMENT:        Charts and notes reviewed.   [name] [name] is a 87 y.o. female with:       #1.  Atrial fibrillation.  Persistent.  Rate controlled.   #2.  No known history of symptoms suggestive of coronary artery disease.   #3.  COVID-positive minimal symptoms.  On treatment.   #4.  Incarcerated umbilical hernia.  Diet being advanced.  Conservative management.           REC:           #1.  Agree with metoprolol.   #2.  Agree with conservative management and advancing diet.   #3.  May switch to oral anticoagulation such as Eliquis if no surgery is being scheduled   #4.  Monitor and optimize electrolytes.   #5.  Will follow as needed.              SUBJECTIVE:        VS:    Visit Vitals      BP  (!) 162/95     Pulse  82     Temp  97.9 F (36.6 C)     Resp  21     Ht  5\' 6"  (1.676 m)     Wt  74.2 kg (163 lb 9.3 oz)     SpO2  96%     Breastfeeding  No        BMI  26.40 kg/m          Last 3 Recorded Weights in this Encounter            10/30/20 0706  10/31/20 0148  11/01/20 0755          Weight:  76 kg (167 lb 8.8 oz)  74.2 kg (163 lb 9.3 oz)  74.2 kg (163 lb 9.3 oz)         Body mass index is 26.4 kg/m.       Intake/Output Summary (Last 24 hours) at 11/03/2020 1329   Last data filed at 11/02/2020 2325     Gross per 24 hour        Intake  --        Output  400 ml        Net  -400 ml           TELE : Atrial fibrillation      EXAM:      General:         Labs:        Basic Metabolic Profile     Lab Results      Component  Value  Date/Time        NA  135 (L)  11/02/2020 01:55 AM        NA  135 (L)   10/31/2020 04:59 AM        NA  129 (L)  10/30/2020 07:50 PM        K  3.7  11/02/2020 01:55 AM        K  3.7  10/31/2020 04:59 AM        K  3.7  10/30/2020 07:50 PM        CL  105  11/02/2020 01:55 AM        CL  105  10/31/2020 04:59 AM        CL  97 (L)  10/30/2020 07:50 PM        CO2  22  11/02/2020 01:55 AM        CO2  24  10/31/2020 04:59 AM        CO2  23  10/30/2020 07:50 PM        BUN  6 (L)  11/02/2020 01:55 AM        BUN  10  10/31/2020 04:59 AM        BUN  10  10/30/2020 07:50 PM        CREA  0.49 (L)  11/02/2020 01:55 AM        CREA  0.56  10/31/2020 04:59 AM        CREA  0.58  10/30/2020 07:50 PM                Lab Results      Component  Value  Date/Time        WBC  10.0  11/02/2020 01:55 AM        WBC  9.0  10/31/2020 04:59 AM        HGB  12.5 (L)  11/02/2020 01:55 AM        HGB  11.9 (L)  10/31/2020 04:59 AM        HGB  13.2  10/30/2020 07:50 PM        HCT  38.1  11/02/2020 01:55 AM        HCT  35.7 (L)  10/31/2020 04:59 AM        HCT  39.8  10/30/2020 07:50 PM        PLT  272  11/02/2020 01:55 AM        PLT  237  10/31/2020 04:59 AM                     Cardiac Enzymes       No results found for: CPK, CKMB, CKNDX          Coagulation     Lab Results      Component  Value  Date/Time        PTP  19.9 (H)  10/30/2020 07:50 PM        PTP  16.6 (H)  10/21/2020 12:52 PM        INR  1.7 (H)  10/30/2020 07:50 PM        INR  1.4 (H)  10/21/2020 12:52 PM        APTT  31.5  08/27/2017 01:52 PM                Medications:        Current Facility-Administered Medications          Medication  Dose  Route  Frequency           ?  lactulose (CHRONULAC) 10 gram/15 mL solution 45 mL   30 g  Oral  BID     ?  metoprolol tartrate (LOPRESSOR) tablet 25 mg   25 mg  Oral  Q8H     ?  enoxaparin (LOVENOX) injection 70 mg   1 mg/kg  SubCUTAneous  Q12H     ?  levothyroxine (SYNTHROID) tablet 125 mcg   125 mcg  Oral  6am     ?  [Held by provider] lisinopriL (PRINIVIL, ZESTRIL) tablet 40 mg   40 mg  Oral  DAILY     ?  rifAXIMin  (XIFAXAN) tablet 550 mg   550 mg  Oral  BID     ?  [Held by provider] furosemide (LASIX) tablet 40 mg   40 mg  Oral  DAILY     ?  [Held by provider] magnesium oxide (MAG-OX) tablet 400 mg   400 mg  Oral  DAILY     ?  [Held by provider] melatonin tablet 3 mg   3 mg  Oral  QHS     ?  [Held by provider] potassium chloride (K-DUR, KLOR-CON M20) SR tablet 40 mEq   40 mEq  Oral  DAILY     ?  [Held by provider] calcium-vitamin D (OS-CAL +D3) 500 mg-200 unit per tablet 1 Tablet   1 Tablet  Oral  DAILY WITH LUNCH     ?  hydrOXYchloroQUINE (PLAQUENIL) tablet 200 mg   200 mg  Oral  BID           ?  [Held by provider] folic acid (FOLVITE) tablet 1 mg   1 mg  Oral  DAILY               K. Mickie Kay, MD   Cell: [phone].      November 03, 2020   1:29 PM

## 2020-11-03 NOTE — Progress Notes (Signed)
Progress Note             Daily Progress Note: 11/03/2020    Assessment/Plan:          Pt continues with non-frightening hallucinations and confusion.   Yesterday she saw mice on coat hooks, today her husband is in the room with her.   (She is a widow.)    Oriented to year, month, day of week.        Denies sob, chest pain.   Tolerating soft diet, no N/V, abd pain.   Had one loose BM yesterday.    Afebrile.   AF with CVR.   Few rales.   Hernia non-tender, bruising continues to improve.          Umbilical hernia.   Trial advance to regular diet.      DC foley cathetter.              (S/p sigmoid resection and bladder repair 06/2003, prior TAH, then subsequent BSO).  2.   New onset atrial fibrillation with CVR.   As per CVAL.        3.   Acute Covid-19 infection.   Minimally symptomatic.with mild cough.   Categorized  higher risk with 20 day isolation per hospital's protocol.   ( High viral titer and % O2 sats < 94%).  4.   Cirrhosis of liver (Hemochromatosis, Methotrexate for several years for RA) with hepatic encephalopathy and mild auto-anticoagulation (INR 1.4 on admission).    Trial increase Lactulose if doesn't create too much diarrhea.    Continue dual tx with Xifaxin for now.  5.   Encephalopathy/hallucinations.   Certainly there is hepatic encephalopathy but I remain concerned that there may be another factor(s) contributing.   Consult placed to neurology for their assessment.     (Initial outpatient labs 09/24/20, in computer,  included a nl ESR and minimally elevated CRP)  6.   Multiple falls, 2 within 24 hrs of admission.    Hx multiple prior orthopedic surgeries.  7.   Hypothyroidism, inadequate replacement.   Dose increased from 100 to 125 mcg this admission.    8.    Hemochromatosis receiving periodic phlebotomies (~ 2/year) via Dr. Keturah Barre.    9.   RA, on Methotrexate approximately the last five years, DC'd about a month ago.  Continue Plaquenil.   Would have to wait a few weeks for a new DMARD with #1.    (Dr. Kalman Jewels)  10.   Hx subdural hematoma 08/2017, not requiring surgery.  11.  HTN, currently normotensive off meds.  12.   RLL lung nodules.   Eventual CT chest.   Quit smoking ~40 years ago.       (A tentative overall plan for patient, subject to change, would be rehab after hospitalization for patient to gain muscular strength, hopefully improve her nutritional status, move further away from acute Covid-19 infection, refer to gen surgery for pseudo-elective repair of abdominal hernia.)                 Subjective:      Review of Systems:  ROS    Objective:   Physical Exam: Physical Exam  Cardiovascular:      Rate and Rhythm: Normal rate. Rhythm irregular.   Pulmonary:      Effort: Pulmonary effort is normal.      Comments: Few rales  Abdominal:      Tenderness: There is no guarding or rebound.  Hernia: A hernia is present.   Musculoskeletal:      Right lower leg: No edema.      Left lower leg: No edema.   Neurological:      Mental Status: She is alert.       Visit Vitals  BP (!) 162/95   Pulse 82   Temp 97.9 ??F (36.6 ??C)   Resp 21   Ht '5\' 6"'$  (1.676 m)   Wt 74.2 kg (163 lb 9.3 oz)   SpO2 96%   Breastfeeding No   BMI 26.40 kg/m??    O2 Flow Rate (L/min): 2 l/min O2 Device: None (Room air)    Temp (24hrs), Avg:98.1 ??F (36.7 ??C), Min:97.9 ??F (36.6 ??C), Max:98.2 ??F (36.8 ??C)    No intake/output data recorded.   08/19 1901 - 08/21 0700  In: 400 [P.O.:400]  Out: 800 [Urine:800]      Data Review:       24 Hour Results:  No results found for this or any previous visit (from the past 24 hour(s)).            Problem List:  Problem List as of 11/03/2020 Date Reviewed: 11-19-20            Codes Class Noted - Resolved    [id number] ICD-10-CM: U07.1  ICD-9-CM: 079.89  10/21/2020 - Present    Overview Signed 11/01/2020  1:27 PM by Lillia Dallas, MD     10/21/20 SARS CoV-2 detected             * (Principal) Atrial fibrillation ([place]) ICD-10-CM: I48.91  ICD-9-CM: 427.31  19-Nov-2020 - Present    Overview Signed 11/01/2020  1:18 PM by  Lillia Dallas, MD     Noted this AM (19-Nov-2020)  Rate controlled (84 BPM)  No previously documented hx of AFIB  CHADS2VASc = 6  ECHO:  EF 75%< LV 25/44, S/PW 9/10, LAVI 28, Mild MR, Mod TR, RVSP 43             Other partial intestinal obstruction ([place]) ICD-10-CM: K56.690  ICD-9-CM: 560.89  19-Nov-2020 - Present    Overview Addendum 11/01/2020  1:28 PM by Lillia Dallas, MD     10/30/20 CT Small bowel obstruction secondary to presumed small bowel containing  umbilical hernia. Stranding within the hernia sac suggesting engorgement. No  pneumatosis.  11/01/20 SURG We will continue to follow the patient to ensure she does not develop worsening incarceration and bowel obstruction from the hernia.  Currently if we can delay the surgery she would likely benefit from decreased risk of postoperative complications             Metabolic encephalopathy SWN-46-EV: G93.41  ICD-9-CM: 348.31  10/21/2020 - Present        Contusion of left hip ICD-10-CM: S70.02XA  ICD-9-CM: 924.01  10/21/2020 - Present        Left elbow contusion ICD-10-CM: S50.02XA  ICD-9-CM: 923.11  10/21/2020 - Present        Multiple falls ICD-10-CM: R29.6  ICD-9-CM: V15.88  10/21/2020 - Present        Deep venous thrombosis (HCC) ICD-10-CM: I82.409  ICD-9-CM: 453.40  Unknown - Present        Subdural hematoma caused by concussion ([place]) ICD-10-CM: O35.0K9F  ICD-9-CM: 852.29  08/27/2017 - Present        Subdural hematoma ([place]) ICD-10-CM: G18.2X9B  ICD-9-CM: 432.1  08/27/2017 - Present       Medications reviewed  Current Facility-Administered Medications  Medication Dose Route Frequency    lactulose (CHRONULAC) 10 gram/15 mL solution 45 mL  30 g Oral BID    metoprolol tartrate (LOPRESSOR) tablet 25 mg  25 mg Oral Q8H    enoxaparin (LOVENOX) injection 70 mg  1 mg/kg SubCUTAneous Q12H    levothyroxine (SYNTHROID) tablet 125 mcg  125 mcg Oral 6am    [Held by provider] lisinopriL (PRINIVIL, ZESTRIL) tablet 40 mg  40 mg Oral DAILY    rifAXIMin (XIFAXAN) tablet 550 mg  550 mg Oral  BID    [Held by provider] furosemide (LASIX) tablet 40 mg  40 mg Oral DAILY    albuterol-ipratropium (DUO-NEB) 2.5 MG-0.5 MG/3 ML  3 mL Nebulization Q4H PRN    [Held by provider] magnesium oxide (MAG-OX) tablet 400 mg  400 mg Oral DAILY    [Held by provider] melatonin tablet 3 mg  3 mg Oral QHS    [Held by provider] potassium chloride (K-DUR, KLOR-CON M20) SR tablet 40 mEq  40 mEq Oral DAILY    [Held by provider] calcium-vitamin D (OS-CAL +D3) 500 mg-200 unit per tablet 1 Tablet  1 Tablet Oral DAILY WITH LUNCH    hydrOXYchloroQUINE (PLAQUENIL) tablet 200 mg  200 mg Oral BID    [Held by provider] folic acid (FOLVITE) tablet 1 mg  1 mg Oral DAILY    naloxone (NARCAN) injection 0.1 mg  0.1 mg IntraVENous PRN    acetaminophen (TYLENOL) tablet 650 mg  650 mg Oral Q4H PRN    Or    acetaminophen (TYLENOL) solution 650 mg  650 mg Oral Q4H PRN    Or    acetaminophen (TYLENOL) suppository 650 mg  650 mg Rectal Q4H PRN    [Held by provider] HYDROcodone-acetaminophen (NORCO) 5-325 mg per tablet 1 Tablet  1 Tablet Oral Q6H PRN    ondansetron (ZOFRAN) injection 4 mg  4 mg IntraVENous Q4H PRN       Care Plan discussed with: Patient/Family    Total time spent with patient: 40 minutes.    Colen Darling, MD  November 03, 2020

## 2020-11-03 NOTE — Progress Notes (Signed)
Progress Notes by Elveria Rising, MD at 11/03/20 1657                Author: Elveria Rising, MD  Service: SURGERY  Author Type: Physician       Filed: 11/03/20 1703  Date of Service: 11/03/20 1657  Status: Signed          Editor: Elveria Rising, MD (Physician)                                   Acute Care Surgery Progress note   November 03, 2020             Patient: [name] [name]  Age: 87 y.o.  Sex: female          Date of Birth: 03/27/1933  Admit Date: 10/21/2020  Admit Doctor: [name], MD         MRN: [id number]   CSN: [id number]   PCP: [name], MD        Admit Date: 10/21/2020    LOS: 13 days          INTERVAL EVENTS: No acute events overnight.  Patient reports no worsening abdominal pain.  She does not have any nausea or vomiting.          Assessment:        Principal Problem:     Atrial fibrillation (HCC) (10/31/2020)       Overview: ? Noted this AM (10/31/20)       ? Rate controlled (84 BPM)       ? No previously documented hx of AFIB       ? CHADS2VASc = 6       ? ECHO:  EF 75%< LV 25/44, S/PW 9/10, LAVI 28, Mild MR, Mod TR, RVSP 43      Active Problems:     COVID-19 (10/21/2020)       Overview: 10/21/20 SARS CoV-2 detected        Metabolic encephalopathy (10/21/2020)        Contusion of left hip (10/21/2020)        Left elbow contusion (10/21/2020)        Multiple falls (10/21/2020)        Other partial intestinal obstruction (HCC) (10/31/2020)       Overview: 10/30/20 CT Small bowel obstruction secondary to presumed small bowel        containing       umbilical hernia. Stranding within the hernia sac suggesting engorgement.        No       pneumatosis.       11/01/20 SURG We will continue to follow the patient to ensure she does not        develop worsening incarceration and bowel obstruction from the hernia.         Currently if we can delay the surgery she would likely benefit from        decreased risk of postoperative complications               Diet: Regular diet   Abx:      Inpat Anti-Infectives  (From admission,  onward)               Start        Ordered  Stop        10/25/20 1200    cephALEXin (KEFLEX) capsule 500 mg  500 mg,   Oral,   EVERY 6 HOURS             10/25/20 1043  10/28/20 1159        10/24/20 1200    rifAXIMin (XIFAXAN) tablet 550 mg  550 mg,   Oral,   2 TIMES DAILY             10/24/20 1047  --        10/21/20 2100    hydrOXYchloroQUINE (PLAQUENIL) tablet 200 mg  200 mg,   Oral,   2 TIMES DAILY            Note to Pharmacy: OP AST:MHDQ 200 mg by mouth two (2) times a day.          10/21/20 1905  --                                          Prophylaxis: Lovenox 40 mg daily for DVT prophylaxis      [name] [name] is a 87 y.o. female with multiple medical comorbidities including current COVID positivity with a periumbilical hernia with a small portion of bowel resulting in a partial bowel obstruction.   I believe I was able to reduce the majority of this and contrast progressed through the area into the colon without issue.  I discussed the patient with Dr. Caralyn Guile and believe that the patient will benefit from interval hernia repair after resolution  of her COVID and improvement of her encephalopathy.           Plan:           We will continue to follow the patient to ensure she does not develop worsening incarceration and bowel obstruction from the hernia.  Currently if we can delay the surgery she would likely benefit  from decreased risk of postoperative complications.      Thank you for allowing [place] to participate in the care of your patient.                 Subjective:        No acute events overnight.  Tolerating a diet. Denies abdominal pain.        Objective:          Vitals:             11/03/20 0400  11/03/20 0634  11/03/20 0912  11/03/20 1645           BP:    (!) 151/79  (!) 162/95  (!) 146/82     Pulse:  90  69  82  84     Resp:    18  21  20      Temp:    98.2 ??F (36.8 ??C)  97.9 ??F (36.6 ??C)  97.8 ??F (36.6 ??C)     SpO2:    98%  96%  97%     Weight:                   Height:                     Intake and Output:   Current Shift: 08/21 0701 - 08/21 1900   In: -    Out: 550 [Urine:550]   Last three shifts: 08/19 1901 - 08/21 0700   In: 400 [P.O.:400]   Out:  800 [Urine:800]      Physical Exam:    GENERAL:  patient resting in bed in NAD, appears age stated, cooperative    HEENT:  sclera anicteric, normal cephalic, atraumatic    NECK:  supple; no appreciable JVD    CHEST: Irregularly irregular, regular rate   LUNGS:  No increased work of breathing.  Equal chest expansion bilaterally.    ABD: Soft, nontender, nondistended, periumbilical hernia with some overlying erythema but this appears to be improving and tender to palpation also improving   EXT:  Warm.  No clubbing.  No cyanosis, swelling or edema.    SKIN:  No rash     NEURO:  awake, alert and oriented.  Moves all 4 extremities.    PSYCH:  normal affect          Lab/Data Review:       No results found for this or any previous visit (from the past 12 hour(s)).         Imaging Review:     No results found.      Labs and imaging personally reviewed and my findings are as follows: No new labs or imaging today      Brown Human, MD, FACS   Acute Care Surgery Team   [facility]      For questions or concerns please contact the Acute Care Surgery Team  by dedicated pager: [phone]

## 2020-11-03 NOTE — Progress Notes (Signed)
Problem: Airway Clearance - Ineffective  Goal: Achieve or maintain patent airway  Outcome: Progressing Towards Goal     Problem: Gas Exchange - Impaired  Goal: Absence of hypoxia  Outcome: Progressing Towards Goal     Problem: Breathing Pattern - Ineffective  Goal: Ability to achieve and maintain a regular respiratory rate  Outcome: Progressing Towards Goal     Problem: Pressure Injury - Risk of  Goal: *Prevention of pressure injury  Description: Document Braden Scale and appropriate interventions in the flowsheet.  Outcome: Progressing Towards Goal  Note: Pressure Injury Interventions:  Sensory Interventions: Assess changes in LOC, Assess need for specialty bed, Keep linens dry and wrinkle-free, Maintain/enhance activity level, Minimize linen layers    Moisture Interventions: Absorbent underpads, Apply protective barrier, creams and emollients    Activity Interventions: Pressure redistribution bed/mattress(bed type), Increase time out of bed    Mobility Interventions: HOB 30 degrees or less, Pressure redistribution bed/mattress (bed type)    Nutrition Interventions: Document food/fluid/supplement intake    Friction and Shear Interventions: Apply protective barrier, creams and emollients, Lift sheet, Lift team/patient mobility team                Problem: Falls - Risk of  Goal: *Absence of Falls  Description: Document Schmid Fall Risk and appropriate interventions in the flowsheet.  Outcome: Progressing Towards Goal  Note: Fall Risk Interventions:  Mobility Interventions: Bed/chair exit alarm, Patient to call before getting OOB    Mentation Interventions: Adequate sleep, hydration, pain control, Bed/chair exit alarm    Medication Interventions: Bed/chair exit alarm, Patient to call before getting OOB, Teach patient to arise slowly    Elimination Interventions: Call light in reach, Bed/chair exit alarm, Patient to call for help with toileting needs    History of Falls Interventions: Bed/chair exit alarm

## 2020-11-04 ENCOUNTER — Inpatient Hospital Stay: Admit: 2020-11-04 | Payer: MEDICARE | Primary: Internal Medicine

## 2020-11-04 MED ORDER — IOPAMIDOL 61 % IV SOLN
30061 mg iodine /mL (61 %) | Freq: Once | INTRAVENOUS | Status: AC
Start: 2020-11-04 — End: 2020-11-04
  Administered 2020-11-04: 16:00:00 via INTRAVENOUS

## 2020-11-04 MED FILL — ISOVUE-300  61 % INTRAVENOUS SOLUTION: 300 mg iodine /mL (61 %) | INTRAVENOUS | Qty: 80

## 2020-11-04 MED FILL — HYDROXYCHLOROQUINE 200 MG TAB: 200 mg | ORAL | Qty: 1

## 2020-11-04 MED FILL — METOPROLOL TARTRATE 25 MG TAB: 25 mg | ORAL | Qty: 1

## 2020-11-04 MED FILL — LACTULOSE 20 GRAM/30 ML ORAL SOLUTION: 20 gram/30 mL | ORAL | Qty: 60

## 2020-11-04 MED FILL — LOVENOX 300 MG/3 ML SUBCUTANEOUS SOLUTION: 300 mg/3 mL | SUBCUTANEOUS | Qty: 0.7

## 2020-11-04 MED FILL — LEVOTHYROXINE 125 MCG TAB: 125 mcg | ORAL | Qty: 1

## 2020-11-04 MED FILL — XIFAXAN 550 MG TABLET: 550 mg | ORAL | Qty: 1

## 2020-11-04 NOTE — Progress Notes (Signed)
Problem: Airway Clearance - Ineffective  Goal: Achieve or maintain patent airway  Outcome: Progressing Towards Goal     Problem: Gas Exchange - Impaired  Goal: Absence of hypoxia  Outcome: Progressing Towards Goal  Goal: Promote optimal lung function  Outcome: Progressing Towards Goal     Problem: Breathing Pattern - Ineffective  Goal: Ability to achieve and maintain a regular respiratory rate  Outcome: Progressing Towards Goal     Problem: Body Temperature -  Risk of, Imbalanced  Goal: Ability to maintain a body temperature within defined limits  Outcome: Progressing Towards Goal  Goal: Will regain or maintain usual level of consciousness  Outcome: Progressing Towards Goal  Goal: Complications related to the disease process, condition or treatment will be avoided or minimized  Outcome: Progressing Towards Goal

## 2020-11-04 NOTE — Progress Notes (Signed)
Discharge planning:  Current discharge plan is rehab.  Patient and family are requesting [facility] which has accepted.  Patient is 14 days post positive COVID, and therefore should not need isolation at the rehab facility.  Email sent to Tyrone Schimke with [facility] to notify him of impending discharge.  Per atttending, Dr. Caralyn Guile, discharged anticipated for 11/06/2020.  Patient will need medical transport for discharge.

## 2020-11-04 NOTE — Progress Notes (Signed)
Pt foley removed at 1414 per dayshift RN, also reported bladder scan as of 1830 is .    0130 pt has not voided. Noted poor PO intake, no continuous IVF. Bladder scan , Mauser MD paged. T    Recheck bladder again at 0600 if pt has not voided. If bladder scan above 500, straight cath x1 per Mauser MD.    0540 Pt had large bowel incontinence, no urine output.     0208 Bladder scan . Pt straight cathed per MD order, urine output . Will let oncoming RN aware.

## 2020-11-04 NOTE — Progress Notes (Signed)
Subjective:  Symptoms:  Stable.  She reports malaise and weakness.  No shortness of breath.    Diet:  Adequate intake.  No nausea.    Activity level: Impaired due to weakness.    Pain:  She reports no pain.        Temp:  [97.3 ??F (36.3 ??C)-98.5 ??F (36.9 ??C)]   Pulse (Heart Rate):  [65-90]   BP: (118-162)/(66-95)   Resp Rate:  [16-24]   O2 Sat (%):  [95 %-98 %]   Weight:  [70.1 kg (154 lb 8.7 oz)]   08/22 0701 - 08/22 1900  In: 250 [P.O.:250]  Out: -   08/20 1901 - 08/22 0700  In: -   Out: 1525 [Urine:1525]      Objective:  General Appearance:  Comfortable and in no acute distress.    Vital signs: (most recent): Blood pressure 118/73, pulse 65, temperature 98.1 ??F (36.7 ??C), resp. rate 17, height 5\' 6"  (1.676 m), weight 70.1 kg (154 lb 8.7 oz), SpO2 96 %, not currently breastfeeding.  Vital signs are normal.    Output: Producing urine.    Lungs:  Normal effort.    Heart: Normal rate.  Irregular rhythm.    Chest: Symmetric chest wall expansion.   Abdomen: Abdomen is soft.  Bowel sounds are normal.     Pulses: Distal pulses are intact.    Neurological: Patient is alert.    Pupils:  Pupils are equal, round, and reactive to light.    Skin:  Warm.        Principal Problem:    Atrial fibrillation (HCC) (10/31/2020)      Overview: ? Noted this AM (10/31/20)      ? Rate controlled (84 BPM)      ? No previously documented hx of AFIB      ? CHADS2VASc = 6      ? ECHO:  EF 75%< LV 25/44, S/PW 9/10, LAVI 28, Mild MR, Mod TR, RVSP 43    Active Problems:    COVID-19 (10/21/2020)      Overview: 10/21/20 SARS CoV-2 detected      Metabolic encephalopathy (10/21/2020)      Contusion of left hip (10/21/2020)      Left elbow contusion (10/21/2020)      Multiple falls (10/21/2020)      Other partial intestinal obstruction (HCC) (10/31/2020)      Overview: 10/30/20 CT Small bowel obstruction secondary to presumed small bowel       containing      umbilical hernia. Stranding within the hernia sac suggesting engorgement.       No      pneumatosis.       11/01/20 SURG We will continue to follow the patient to ensure she does not       develop worsening incarceration and bowel obstruction from the hernia.        Currently if we can delay the surgery she would likely benefit from       decreased risk of postoperative complications          Assessment & Plan    Charts and notes reviewed.  [name] [name] is a 87 y.o. female with:      #1.  Atrial fibrillation.  Persistent.  Rate controlled.  #2.  No known history of symptoms suggestive of coronary artery disease.  #3.  COVID-positive minimal symptoms.  On treatment.  #4.  Incarcerated umbilical hernia.  Diet being advanced.  Conservative management.  REC:         #1.  Agree with metoprolol.  #2.  Agree with conservative management and advancing diet.  #3.  May switch to oral anticoagulation such as Eliquis if no surgery is being scheduled  #4.  Monitor and optimize electrolytes.  #5.  Will follow as needed.

## 2020-11-04 NOTE — Consults (Signed)
Neurology Consult Note    Patient: Champ MungoBarbara K Beining Age: 87 y.o. Sex: female    Date of Birth: 1933-06-10 Admit Date: 10/21/2020 PCP: Blenda BridegroomMauser, Thomas L, MD   MRN: [id number]  CSN: [id number]       Date: November 04, 2020     Reason for Neurology Consultation:   hallucinations      Assessment:  16F with hereditary hemochromatosis admitted for recurrent falls and acute covid infection. Pt has been experiencing hallucinations for the past several months characterized by the presence of children or small animals in the room. This was first discovered by family who observed her speaking to people who weren't there. This roughly coincides with an increasing incidence of falls.     On exam, patient does not display any overt signs of parkinsonism, however early dementia with lewy bodies is on the differential given her age and the suggestive description of the hallucinations. Psychosis secondary to progressive hemochromatosis is also a consideration.  This does not appear to be delirium given the timeline and her exam - she is attentive and conversant and follows commands without trouble.    Plan:  - Follow up with outpatient neurology periodically to screen for signs of parkinsonism as it pertains to DLB.   - Agree with correction of ammonia and ongoing tx for hemochromatosis      Evlyn KannerShayan Mclean Moya, D.O.  Neurohospitalist  [phone]  ___________________________________________________  History of Present Illness:   89F with hemochromatosis admitted for recurrent falls and acute covid infection. Pt has been experiencing hallucinations for the past several months characterized by the presence of children or small animals in the room, well formed to the point where she would try to speak with them. This was first discovered by family who observed her speaking to no one in particular.       Review of Systems:  10-point review of systems was reviewed with pertinent positives noted in the assessment & plan.    Physical Examination:     Visit Vitals  BP 124/75 (BP 1 Location: Left upper arm, BP Patient Position: Supine)   Pulse 74   Temp (!) 96.3 ??F (35.7 ??C)   Resp 18   Ht 5\' 6"  (1.676 m)   Wt 70.1 kg (154 lb 8.7 oz)   SpO2 92%   Breastfeeding No   BMI 24.94 kg/m??       General:  Well nourished in no acute distress.  HEENT: Normocephalic, atraumatic   Neck: Supple,no cervical lymphadenopathy seen.    Heart: No JVD seen  Musculoskeletal: No deformity seen  Psych:  Blunted mood and affect     Neurological Examination:     Mental Status:  Alert and oriented.  Somewhat blunted mood and affect.    Language:  Converses appropriately.   Cranial Nerves:  PERRL, EOMI, no nystagmus, no facial asymmetry, sensation intact  Motor:  No pronator drift.  No abnormal posturing. No hyperkinetic movements observed.  Reflexes:  Symmetric  Sensation:  Intact to light touch and temp  Coordination:  No gross ataxia.  Gait: Deferred        Current Facility-Administered Medications   Medication Dose Route Frequency    lactulose (CHRONULAC) 10 gram/15 mL solution 45 mL  30 g Oral BID    metoprolol tartrate (LOPRESSOR) tablet 25 mg  25 mg Oral Q8H    enoxaparin (LOVENOX) injection 70 mg  1 mg/kg SubCUTAneous Q12H    levothyroxine (SYNTHROID) tablet 125 mcg  125 mcg Oral 6am    [  Held by provider] lisinopriL (PRINIVIL, ZESTRIL) tablet 40 mg  40 mg Oral DAILY    rifAXIMin (XIFAXAN) tablet 550 mg  550 mg Oral BID    [Held by provider] furosemide (LASIX) tablet 40 mg  40 mg Oral DAILY    albuterol-ipratropium (DUO-NEB) 2.5 MG-0.5 MG/3 ML  3 mL Nebulization Q4H PRN    [Held by provider] magnesium oxide (MAG-OX) tablet 400 mg  400 mg Oral DAILY    [Held by provider] melatonin tablet 3 mg  3 mg Oral QHS    [Held by provider] potassium chloride (K-DUR, KLOR-CON M20) SR tablet 40 mEq  40 mEq Oral DAILY    [Held by provider] calcium-vitamin D (OS-CAL +D3) 500 mg-200 unit per tablet 1 Tablet  1 Tablet Oral DAILY WITH LUNCH    hydrOXYchloroQUINE (PLAQUENIL) tablet 200 mg  200 mg Oral  BID    [Held by provider] folic acid (FOLVITE) tablet 1 mg  1 mg Oral DAILY    naloxone (NARCAN) injection 0.1 mg  0.1 mg IntraVENous PRN    acetaminophen (TYLENOL) tablet 650 mg  650 mg Oral Q4H PRN    Or    acetaminophen (TYLENOL) solution 650 mg  650 mg Oral Q4H PRN    Or    acetaminophen (TYLENOL) suppository 650 mg  650 mg Rectal Q4H PRN    [Held by provider] HYDROcodone-acetaminophen (NORCO) 5-325 mg per tablet 1 Tablet  1 Tablet Oral Q6H PRN    ondansetron (ZOFRAN) injection 4 mg  4 mg IntraVENous Q4H PRN     Allergies   Allergen Reactions    Shellfish Derived Hives    Sulfa (Sulfonamide Antibiotics) Hives     Past Medical History:   Diagnosis Date    Arthritis     Deep venous thrombosis (HCC)     Endocrine disease     hypothyroid    Hemochromatosis     Hypertension      Past Surgical History:   Procedure Laterality Date    HX ORTHOPAEDIC      left knee replacement     Social History     Socioeconomic History    Marital status: WIDOWED     Spouse name: Not on file    Number of children: Not on file    Years of education: Not on file    Highest education level: Not on file   Occupational History    Not on file   Tobacco Use    Smoking status: Never    Smokeless tobacco: Never   Vaping Use    Vaping Use: Never used   Substance and Sexual Activity    Alcohol use: No    Drug use: No    Sexual activity: Not on file   Other Topics Concern    Not on file   Social History Narrative    Not on file     Social Determinants of Health     Financial Resource Strain: Not on file   Food Insecurity: Not on file   Transportation Needs: Not on file   Physical Activity: Not on file   Stress: Not on file   Social Connections: Not on file   Intimate Partner Violence: Not on file   Housing Stability: Not on file     History reviewed. No pertinent family history.    Lab Review   No results found for this or any previous visit (from the past 24 hour(s)).  Results from [facility] Encounter encounter on 10/10/20  MRI LUMB SPINE WO  CONT    Narrative  INDICATION: Sciatica, right side , back pain, lumbar fracture,    TECHNIQUE: Multiplanar, multisequence MRI lumbar spine without IV contrast    COMPARISON:Lumbar radiographs dated 09/13/2020    FINDINGS:  Study sensitivity is moderately limited due to motion artifact on several pulse  sequences.    Vertebrae: There is chronic anterior wedge compression fracture of L1 with  approximately 40% height loss. Mild superior endplate compression fracture of L2  with approximately 10% height loss. No edema on the STIR images. Findings of  chronic compression fractures. No evidence of acute compression fracture. Trace  retrolisthesis at L1-L2 and L2-L3.    Disc Spaces: Advanced disc space narrowing at every lumbar disc level. Exuberant  degenerative endplate changes at L4-L5. Multilevel anterior osteophytes.    Conus: Terminates at L1-L2.    Soft Tissues: Unremarkable.    LEVELS:    T12-L1: Disc osteophyte complex. Mild central canal stenosis and moderate right  lateral recess stenosis. Moderate ligamentum flavum thickening. Moderate facet  arthropathy. Moderate right and mild left foraminal stenosis.    L1-L2: Disc osteophyte complex. Severe ligamentum flavum thickening. Mild facet  arthropathy. Mild bilateral foraminal stenosis. Mild central canal stenosis.    L2-L3: Disc osteophyte complex. Moderate ligamentum flavum thickening. Mild  central canal and moderate lateral recess stenosis. Moderate facet arthropathy.  Mild bilateral foraminal stenosis.    L3-L4: Disc osteophyte complex. Mild ligamentum flavum thickening. Moderate  central canal stenosis. Mild facet arthropathy. Mild bilateral foraminal  stenosis.    L4-L5: Disc osteophyte complex. Moderate ligamentum flavum thickening. Moderate  facet arthropathy. Mild central canal and moderate lateral recess stenosis.  Moderate left and mild right foraminal stenosis.    L5-S1: Disc osteophyte complex. Moderate facet arthropathy. Mild central canal  stenosis.  Moderate facet arthropathy. Mild bilateral foraminal stenosis.    Impression  IMPRESSION:  1.  Chronic L1 and L2 compression fractures.    2.  No evidence of acute lumbar compression fracture.    3.  Mild central canal and moderate right lateral recess stenosis at T12-L1.    4.  Mild central canal stenosis at L1-L2 and L5-S1.    5.  Mild central canal and moderate lateral recess stenosis at L2-L3 and L4-L5.    6.  Moderate central canal stenosis at L3-L4.    7.  Multilevel degenerative changes and foraminal stenoses.      Results from Hospital Encounter encounter on 02/03/18    MRI BRAIN WO CONT    Narrative  INDICATION: Ataxia, dizziness.  COMPARISON: Head CT dated 08/30/2017  TECHNIQUE: Multiplanar, multisequence MRI brain without IV contrast.    FINDINGS:    Study sensitivity is mildly limited due to motion artifact on several pulse  sequences.    There is age-related cerebral atrophy. There are chronic small vessel ischemic  changes involving the periventricular and subcortical white matter, mildly  advanced for patient age. Patient previously had a subdural hemorrhage along the  falx. This has resolved.    Elsewhere, there is no brain parenchymal mass, hemorrhage, acute ischemia, or  hydrocephalus. The sella, pineal region, and craniocervical junction are  unremarkable. Skull base flow voids are patent.    Impression  IMPRESSION:    1.  No acute intracranial abnormalities. No acute ischemia.    2.  Chronic small vessel ischemic changes.    CT CHEST W CONT    Result Date: 11/04/2020  INDICATION: pulmonary nodules;  pulmonary nodules COMPARISON: Chest  radiograph dated 10/24/2020, CT abdomen dated 10/30/2020 TECHNIQUE: CT of the Chest with IV contrast. Sagittal and coronal reconstructions. All CT exams at this facility use one or more dose reduction techniques including automatic exposure control, mA/kV adjustment per patient's size, or iterative reconstruction technique. FINDINGS: The central airways are patent.  There has been significant worsening of nodular densities and infiltrates in the right lower lobe initially noted on abdomen CT dated 10/30/2020. Extensive infiltrates as well as nodular and reticulonodular masslike opacity seen throughout the right upper lobe, right middle lobe, and right lower lobe. Relative sparing of the left lung. Moderate right pleural effusion with some passive atelectasis. This has developed in the interval since 10/30/2020. There is cardiomegaly. There are several mildly enlarged mediastinal and hilar lymph nodes, which may be reactive. No axillary lymph node enlargement by CT size criteria. Significant reflux of contrast material in the right axillary region. This may be physiologic from power injector. Venous insufficiency not excluded. Generalized ectasia of the thoracic aorta with mild aneurysmal dilatation of the ascending thoracic aorta measuring up to 4.8 cm. Ectasia of the aortic arch with atherosclerotic calcification. Several areas of mild aneurysmal dilatation involving the descending thoracic aorta measuring up to 3.2 cm. Atherosclerosis of the coronary arteries. Multilevel degenerative changes in the spine. Chronic lumbar compression deformities.     IMPRESSION: 1.  Extensive nodular and reticulonodular infiltrates/opacities involving the right lung. Findings probably reflect infectious/inflammatory process. Neoplastic process is not excluded. 2.  Above findings have progressed in the right lung base, compared  to abdomen CT dated 10/30/2020. 3.  Moderate right pleural effusion, new since previous abdomen CT dated 10/30/2020. 4.  Given the above findings, recommend further evaluation or follow-up study to document resolution.. 5.  Mild mediastinal and hilar lymphadenopathy. This may be reactive. 6.  Cardiomegaly. 7.  Atherosclerosis with ectasia/aneurysmal dilatation of the thoracic aorta. 8.  Other findings as above.        Signed:  Lunette Stands, DO  11/04/2020  4:29 PM

## 2020-11-04 NOTE — Progress Notes (Signed)
SW CONSULT: UAI/96    Medicaid LTSS Screening has been Successfully submitted and it has been waiting for Physician Approval. UAI pending MD signature--private message sent.    Screening ID # : [id number] ONL    The following forms were included in the submission:   UAI   DMAS-96   DMAS-95RC   DMAS-97

## 2020-11-04 NOTE — Progress Notes (Signed)
 physical Therapy Treatment    Patient: [name] [name] (87 y.o. female)  Room: 2215/2215    Date: 11/04/2020  Start Time:  10:10 AM  End Time:  10:40 AM    Primary Diagnosis: Metabolic encephalopathy [G93.41]  Multiple falls [R29.6]  Contusion of left hip [S70.02XA]  Left elbow contusion [S50.02XA]         Precautions: Falls. COVID  Weight bearing precautions: None    Orders reviewed, chart reviewed on [name] [name]. Discussed with RN.     ASSESSMENT :  Based on the objective data described below, the patient presents with  - supine>EOB modA  - modA to scoot forward at EOB  - static and dynamic sitting EOB requires SBA  - STS from EOB requires mod/maxA with RW, requires repeated attempts  - shuffles/pivots feet to bedside chair with minA and RW    Progression toward goals:  []           Improving appropriately and progressing toward goals  [x]           Improving slowly and progressing toward goals  []           Not making progress toward goals and plan of care will be adjusted      Patient's rehabilitation potential is considered to be Fair    Recommendations:  Physical Therapy and Occupational Therapy  Discharge Recommendations: SNF  Further Equipment Recommendations for Discharge:  TBD        PLAN :   Patient will be followed by physical therapy  to address goals.            SUBJECTIVE:   Patient agreeable to PT Just give me a minute      OBJECTIVE DATA SUMMARY:     Patient found: Bed, Telemetry, Bed alarm, and Nursing present.    Pain Assessment before PT session: 0/10  Pain Location:    Pain Assessment after PT session: 0/10  Pain Location:      COGNITIVE STATUS:     Mental Status: Oriented x3.  Communication: normal.  Follows commands: tactile cues, verbal cues, and visual cues.  General Cognition: impaired safety.  Safety/Judgement:  requires cues for safety during mobility .      Functional Mobility and Balance Status:     Bed mobility:    Scooting:        modA    Rolling:           NT      Supine > sit:    modA    Sit > supine:   NT    Transfers:     Sit > stand: mod/maxA RW    Stand > sit: minA    Balance:   Static sitting balance:          good  Dynamic sitting balance:     good  Static standing balance:      poor  Dynamic standing balance: poor      Ambulation/Gait Training:  2-3 feet, steady, unsteady, step to, decreased cadence, decreased step height/length, shuffled, downward gaze, and difficulty/unable to weight shift, Walker, min assist, verbal cues, manual cues, safety concerns, and increased time      Therapeutic Exercises:      Balance Activities:   Seated and Standing    Activity Tolerance:   fair-    Final Location:   bedside chair, all needs close, agrees to call for assistance, and MD present    COMMUNICATION/EDUCATION:  Education: Ill effects of bedrest, benefits of activity while hospitalized, OOB with assist from staff, call staff for assistance, HEP, positioning, safety, DME, disposition, role of PT, PT plan of care.    Barriers to Learning/Limitations: none    Please refer to care plan and patient education section for further details.    Thank you for this referral.  [employer] [name] [name], PTA

## 2020-11-04 NOTE — Progress Notes (Signed)
Progress Notes  by Osie Bond, MD at 11/04/20 1756                Author: Osie Bond, MD  Service: SURGERY  Author Type: Physician       Filed: 11/04/20 1806  Date of Service: 11/04/20 1756  Status: Addendum          Editor: Osie Bond, MD (Physician)          Related Notes: Original Note by Osie Bond, MD (Physician) filed at 11/04/20 [facility]   Acute Care General Surgery      Office:  [phone]   Dedicated Pager: [phone]      SURGERY PROGRESS NOTE             Patient: [name] [name]  Age: 87 y.o.  Sex: female          Date of Birth: Apr 12, 1933  Admit Date: 10/21/2020  Admit Doctor: [name], MD         MRN: [id number]   CSN: [id number]   LOS:  LOS: 14 days      PCP: [name], MD      POD * No surgery found *      Procedure:  * No surgery found *         Interval Changes:   Developing worsening right-sided pneumonia and the pleural effusion.   There was no clinical signs of a small bowel obstruction related to the umbilical hernia.  Patient had the bowel movement and the flatus earlier today.  She is not complaining of pain at the umbilical hernia site.              Assessment:        Principal Problem:     Atrial fibrillation (HCC) (10/31/2020)       Overview: ? Noted this AM (10/31/20)       ? Rate controlled (84 BPM)       ? No previously documented hx of AFIB       ? CHADS2VASc = 6       ? ECHO:  EF 75%< LV 25/44, S/PW 9/10, LAVI 28, Mild MR, Mod TR, RVSP 43      Active Problems:     COVID-19 (10/21/2020)       Overview: 10/21/20 SARS CoV-2 detected        Metabolic encephalopathy (10/21/2020)        Contusion of left hip (10/21/2020)        Left elbow contusion (10/21/2020)        Multiple falls (10/21/2020)        Other partial intestinal obstruction (HCC) (10/31/2020)       Overview: 10/30/20 CT Small bowel obstruction secondary to presumed small bowel        containing       umbilical hernia. Stranding within the hernia sac suggesting engorgement.         No       pneumatosis.       11/01/20 SURG We will continue to follow the patient to ensure she does not        develop worsening incarceration and bowel obstruction from the hernia.         Currently if we can delay the surgery she would likely benefit from  decreased risk of postoperative complications                       Plan/Recommendations/Medical Decision Making:        The critical condition is developing and worsening right-sided pneumonia likely related to the COVID.  The CT scan of chest from today was reviewed by myself.      There is no clinical deterioration in terms of umbilical hernia although it was not completely reducible.  Patient had no interval development of worsening abdominal distention, abdominal pain and signs of bowel obstruction.      It appeared to me that the worsening pneumonia is currently outweighing the umbilical hernia which can be managed electively.      I recommend low residual diet as tolerated and recommend outpatient follow-up with general surgery (Drs Dionne Bucy) for elective umbilical hernia repair when she recovers from the COVID-pneumonia.      The acute care surgery will standby and please has no hesitation to call should a new concern or small bowel obstruction develop.            Osie Bond, MD, FACS      Acute Care Surgery    [facility]       For questions or concerns please contact the Acute Care Surgery Team  by dedicated pager: [phone]            Diet: Low residual diet   Abx:      Inpat Anti-Infectives (From  admission, onward)               Start        Ordered  Stop        10/25/20 1200    cephALEXin (KEFLEX) capsule 500 mg  500 mg,   Oral,   EVERY 6 HOURS             10/25/20 1043  10/28/20 1159        10/24/20 1200    rifAXIMin (XIFAXAN) tablet 550 mg  550 mg,   Oral,   2 TIMES DAILY             10/24/20 1047  --        10/21/20 2100    hydrOXYchloroQUINE (PLAQUENIL) tablet 200 mg  200 mg,   Oral,   2 TIMES DAILY             Note to Pharmacy: OP TKP:TWSF 200 mg by mouth two (2) times a day.          10/21/20 1905  --                                       Drains: 0   Prophylaxis: Okay for chemical prophylaxis                 For questions or concerns please contact the Acute Care Surgery Team  by:   dedicated pager: [phone]  direct dialing or via [employer]              Subjective:        Patient has complaints of weakness and mild shortness of breath .  There was no nausea or vomiting.  Passing flatus with loose bowel movement today        Objective:  Blood pressure 124/75, pulse 74, temperature (!) 96.3 ??F (35.7 ??C), resp. rate  18, height 5\' 6"  (1.676 m), weight 70.1 kg (154 lb 8.7 oz), SpO2 92 %, not currently breastfeeding.      Temp (24hrs), Avg:97.8 ??F (36.6 ??C), Min:96.3 ??F (35.7 ??C), Max:98.5 ??F (36.9 ??C)         Physical Exam:  GENERAL: alert, cooperative, mild distress, appears stated age, lungs: Reduced breath sounds over the right side chest; HEART:  regular rate and rhythm, S1, S2 normal, no murmur, click, rub or gallop,  ABDOMEN: soft, non-tender. Bowel sounds normal. No masses,  no organomegaly, a small bulge was notified at periumbilical area which was partially reducible without obvious tenderness  to palpation.  Patient was palpated soft without induration.  There was no topical skin erythema. EXTREMITIES:  extremities normal, atraumatic, no cyanosis or edema         Data Review images and reports reviewed   Lab:    No results found for this or any previous visit (from the past 12 hour(s)).   Imaging:     CT CHEST W CONT      Result Date: 11/04/2020   INDICATION: pulmonary nodules;  pulmonary nodules COMPARISON: Chest radiograph dated 10/24/2020, CT abdomen dated 10/30/2020 TECHNIQUE: CT of the Chest with IV contrast. Sagittal and coronal reconstructions. All CT exams at this facility use one or more  dose reduction techniques including automatic exposure control, mA/kV adjustment per patient's  size, or iterative reconstruction technique. FINDINGS: The central airways are patent. There has been significant worsening of nodular densities and infiltrates  in the right lower lobe initially noted on abdomen CT dated 10/30/2020. Extensive infiltrates as well as nodular and reticulonodular masslike opacity seen throughout the right upper lobe, right middle lobe, and right lower lobe. Relative sparing of the  left lung. Moderate right pleural effusion with some passive atelectasis. This has developed in the interval since 10/30/2020. There is cardiomegaly. There are several mildly enlarged mediastinal and hilar lymph nodes, which may be reactive. No axillary  lymph node enlargement by CT size criteria. Significant reflux of contrast material in the right axillary region. This may be physiologic from power injector. Venous insufficiency not excluded. Generalized ectasia of the thoracic aorta with mild aneurysmal  dilatation of the ascending thoracic aorta measuring up to 4.8 cm. Ectasia of the aortic arch with atherosclerotic calcification. Several areas of mild aneurysmal dilatation involving the descending thoracic aorta measuring up to 3.2 cm. Atherosclerosis  of the coronary arteries. Multilevel degenerative changes in the spine. Chronic lumbar compression deformities.       IMPRESSION: 1.  Extensive nodular and reticulonodular infiltrates/opacities involving the right lung. Findings probably reflect infectious/inflammatory process. Neoplastic process is not excluded. 2.  Above findings have progressed in the right lung base,  compared  to abdomen CT dated 10/30/2020. 3.  Moderate right pleural effusion, new since previous abdomen CT dated 10/30/2020. 4.  Given the above findings, recommend further evaluation or follow-up study to document resolution.. 5.  Mild mediastinal and  hilar lymphadenopathy. This may be reactive. 6.  Cardiomegaly. 7.  Atherosclerosis with ectasia/aneurysmal dilatation of the thoracic  aorta. 8.  Other findings as above.              11/01/2020, MD      Surgicalist Pager: [phone]   November 04, 2020

## 2020-11-04 NOTE — Progress Notes (Signed)
Problem: Falls - Risk of  Goal: *Absence of Falls  Description: Document [name] [name] Fall Risk and appropriate interventions in the flowsheet.  Outcome: Progressing Towards Goal  Note: Fall Risk Interventions:  Mobility Interventions: Patient to call before getting OOB, Bed/chair exit alarm    Mentation Interventions: Bed/chair exit alarm, More frequent rounding, Reorient patient, Room close to nurse's station    Medication Interventions: Bed/chair exit alarm, Patient to call before getting OOB    Elimination Interventions: Bed/chair exit alarm, Call light in reach    History of Falls Interventions: Bed/chair exit alarm, Room close to nurse's station

## 2020-11-04 NOTE — Progress Notes (Signed)
Progress Note             Daily Progress Note: 11/04/2020    Assessment/Plan:          Pt denies any hallucinations since yesterday.   Some confusion.   Good recall of distant history (ex-surgery when she was 20) but not short term (not able to identify accurately last BM.)   Oriented to place, year, month, unable to correctly identify day or date.   Pt doesn't recall that her husband is deceased.   (Confirmed with son that he died 7 years ago.)     Denies chest pain, SOB, abd pain, N/V.   Large BM per nursing notes.   Pt  recalls occasional stress incontinence but not problems with urinary retention.   Some non-productive cough.   Tolerating regular diet.       Afebrile.   AF with CVR.        Lungs with minimal rales.     Abdomen non-tender.   Incarcerated periumbilical hernia non-tender.     Observed getting up in chair with PT with moderate assist.       AM lab pending.          Umbilical hernia.   Tolerating regular diet.     Tentative plan for subacute surgical repair in a few weeks.   Appreciate surgery following.             (S/p sigmoid resection and bladder repair 06/2003, prior TAH, then subsequent BSO).  2.   Acute urinary retention.   Foley DCd yesterday.   Voiding trial.   Not known if patient has some degree of chronic retention.   Straight cath if PVR > 1000cc.  3.    New onset atrial fibrillation with CVR.   As per CVAL.          Pt is a pretty high fall risk.   Concerned if/when she eventually can go back to assisted living on her own.   Can see how she does with rehab.  4.   Pulmonary nodules.   Check CT chest.  5.   Hepatic encephalopathy, hallucinations.   Neurology consult placed to assist in evaluating if other factors present.  6   Acute Covid-19 infection.   Minimally symptomatic.with mild cough.   Categorized  higher risk with 20 day isolation per hospital's protocol.   ( High viral titer and % O2 sats < 94%).   Day 14.  7.   Cirrhosis of liver (Hemochromatosis, Methotrexate for several years  for RA) with hepatic encephalopathy and mild auto-anticoagulation (INR 1.4 on admission).    Continue dual tx with Lactulose and Xifaxin for now.     8.   Multiple falls, 2 within 24 hrs of admission.    Hx multiple prior orthopedic surgeries.  9.   Hypothyroidism, inadequate replacement.   Dose increased from 100 to 125 mcg this admission.    10.    Hemochromatosis receiving periodic phlebotomies (~ 2/year) via Dr. Arnette Felts.    11.   RA, on Methotrexate approximately the last five years, DC'd about a month ago.  Continue Plaquenil.   Would have to wait a few weeks for a new DMARD with #1.   (Dr. Salem Caster)  12.   Hx subdural hematoma 08/2017, not requiring surgery.  13.  HTN, currently normotensive off meds.    Tentatively would like to transfer to rehab later this week.    Discussed with son at length per telephone.  Subjective:      Review of Systems:  ROS    Objective:   Physical Exam: Physical Exam  Cardiovascular:      Rate and Rhythm: Normal rate. Rhythm irregular.   Pulmonary:      Effort: Pulmonary effort is normal.      Comments: Few rales  Abdominal:      Tenderness: There is no guarding or rebound.      Hernia: A hernia is present.   Musculoskeletal:      Right lower leg: No edema.      Left lower leg: No edema.   Neurological:      Mental Status: She is alert.       Visit Vitals  BP 118/73 (BP 1 Location: Left upper arm, BP Patient Position: Supine)   Pulse 65   Temp 98.1 ??F (36.7 ??C)   Resp 17   Ht 5\' 6"  (1.676 m)   Wt 70.1 kg (154 lb 8.7 oz)   SpO2 96%   Breastfeeding No   BMI 24.94 kg/m??    O2 Flow Rate (L/min): 2 l/min O2 Device: (P) None (Room air)    Temp (24hrs), Avg:98.1 ??F (36.7 ??C), Min:97.8 ??F (36.6 ??C), Max:98.5 ??F (36.9 ??C)    08/22 0701 - 08/22 1900  In: 250 [P.O.:250]  Out: -    08/20 1901 - 08/22 0700  In: -   Out: 1525 [Urine:1525]      Data Review:       24 Hour Results:  No results found for this or any previous visit (from the past 24 hour(s)).            Problem List:  Problem List  as of 11/04/2020 Date Reviewed: 2020-11-06            Codes Class Noted - Resolved    COVID-19 ICD-10-CM: U07.1  ICD-9-CM: 079.89  10/21/2020 - Present    Overview Signed 11/01/2020  1:27 PM by 11/03/2020, MD     10/21/20 SARS CoV-2 detected             * (Principal) Atrial fibrillation (HCC) ICD-10-CM: I48.91  ICD-9-CM: 427.31  10/31/2020 - Present    Overview Signed 11/01/2020  1:18 PM by 11/03/2020, MD     Noted this AM (10/31/20)  Rate controlled (84 BPM)  No previously documented hx of AFIB  CHADS2VASc = 6  ECHO:  EF 75%< LV 25/44, S/PW 9/10, LAVI 28, Mild MR, Mod TR, RVSP 43             Other partial intestinal obstruction (HCC) ICD-10-CM: K56.690  ICD-9-CM: 560.89  10/31/2020 - Present    Overview Addendum 11/01/2020  1:28 PM by 11/03/2020, MD     10/30/20 CT Small bowel obstruction secondary to presumed small bowel containing  umbilical hernia. Stranding within the hernia sac suggesting engorgement. No  pneumatosis.  11/01/20 SURG We will continue to follow the patient to ensure she does not develop worsening incarceration and bowel obstruction from the hernia.  Currently if we can delay the surgery she would likely benefit from decreased risk of postoperative complications             Metabolic encephalopathy ICD-10-CM: G93.41  ICD-9-CM: 348.31  10/21/2020 - Present        Contusion of left hip ICD-10-CM: 12/21/2020  ICD-9-CM: 924.01  10/21/2020 - Present        Left elbow contusion ICD-10-CM: 12/21/2020  ICD-9-CM: 923.11  10/21/2020 -  Present        Multiple falls ICD-10-CM: R29.6  ICD-9-CM: V15.88  10/21/2020 - Present        Deep venous thrombosis (HCC) ICD-10-CM: I82.409  ICD-9-CM: 453.40  Unknown - Present        Subdural hematoma caused by concussion [facility]) ICD-10-CM: B15.1V6H  ICD-9-CM: 852.29  08/27/2017 - Present        Subdural hematoma (HCC) ICD-10-CM: Y07.3X1G  ICD-9-CM: 432.1  08/27/2017 - Present       Medications reviewed  Current Facility-Administered Medications   Medication Dose Route Frequency     lactulose (CHRONULAC) 10 gram/15 mL solution 45 mL  30 g Oral BID    metoprolol tartrate (LOPRESSOR) tablet 25 mg  25 mg Oral Q8H    enoxaparin (LOVENOX) injection 70 mg  1 mg/kg SubCUTAneous Q12H    levothyroxine (SYNTHROID) tablet 125 mcg  125 mcg Oral 6am    [Held by provider] lisinopriL (PRINIVIL, ZESTRIL) tablet 40 mg  40 mg Oral DAILY    rifAXIMin (XIFAXAN) tablet 550 mg  550 mg Oral BID    [Held by provider] furosemide (LASIX) tablet 40 mg  40 mg Oral DAILY    albuterol-ipratropium (DUO-NEB) 2.5 MG-0.5 MG/3 ML  3 mL Nebulization Q4H PRN    [Held by provider] magnesium oxide (MAG-OX) tablet 400 mg  400 mg Oral DAILY    [Held by provider] melatonin tablet 3 mg  3 mg Oral QHS    [Held by provider] potassium chloride (K-DUR, KLOR-CON M20) SR tablet 40 mEq  40 mEq Oral DAILY    [Held by provider] calcium-vitamin D (OS-CAL +D3) 500 mg-200 unit per tablet 1 Tablet  1 Tablet Oral DAILY WITH LUNCH    hydrOXYchloroQUINE (PLAQUENIL) tablet 200 mg  200 mg Oral BID    [Held by provider] folic acid (FOLVITE) tablet 1 mg  1 mg Oral DAILY    naloxone (NARCAN) injection 0.1 mg  0.1 mg IntraVENous PRN    acetaminophen (TYLENOL) tablet 650 mg  650 mg Oral Q4H PRN    Or    acetaminophen (TYLENOL) solution 650 mg  650 mg Oral Q4H PRN    Or    acetaminophen (TYLENOL) suppository 650 mg  650 mg Rectal Q4H PRN    [Held by provider] HYDROcodone-acetaminophen (NORCO) 5-325 mg per tablet 1 Tablet  1 Tablet Oral Q6H PRN    ondansetron (ZOFRAN) injection 4 mg  4 mg IntraVENous Q4H PRN       Care Plan discussed with: Patient/Family    Total time spent with patient: 40 minutes.    [name], MD  November 04, 2020

## 2020-11-05 LAB — CBC WITH AUTOMATED DIFF
BASOPHILS: 0.8 % (ref 0–3)
BASOPHILS: 0.6 % (ref 0–3)
EOSINOPHILS: 1.6 % (ref 0–5)
EOSINOPHILS: 1.5 % (ref 0–5)
HCT: 37.3 % (ref 37.0–50.0)
HCT: 37.7 % (ref 37.0–50.0)
HGB: 12.2 gm/dl — ABNORMAL LOW (ref 13.0–17.2)
HGB: 12.3 gm/dl — ABNORMAL LOW (ref 13.0–17.2)
IMMATURE GRANULOCYTES: 2 % (ref 0.0–3.0)
IMMATURE GRANULOCYTES: 1.7 % (ref 0.0–3.0)
LYMPHOCYTES: 14.9 % — ABNORMAL LOW (ref 28–48)
LYMPHOCYTES: 15.8 % — ABNORMAL LOW (ref 28–48)
MCH: 34.1 pg (ref 25.4–34.6)
MCH: 33.8 pg (ref 25.4–34.6)
MCHC: 32.7 gm/dl (ref 30.0–36.0)
MCHC: 32.6 gm/dl (ref 30.0–36.0)
MCV: 104.2 fL — ABNORMAL HIGH (ref 80.0–98.0)
MCV: 103.6 fL — ABNORMAL HIGH (ref 80.0–98.0)
MONOCYTES: 7.7 % (ref 1–13)
MONOCYTES: 5.8 % (ref 1–13)
MPV: 10.8 fL — ABNORMAL HIGH (ref 6.0–10.0)
MPV: 11.1 fL — ABNORMAL HIGH (ref 6.0–10.0)
NEUTROPHILS: 73 % — ABNORMAL HIGH (ref 34–64)
NEUTROPHILS: 74.6 % — ABNORMAL HIGH (ref 34–64)
NRBC: 0 (ref 0–0)
NRBC: 0 (ref 0–0)
PLATELET: 322 10*3/uL (ref 140–450)
PLATELET: 335 10*3/uL (ref 140–450)
RBC: 3.58 M/uL — ABNORMAL LOW (ref 3.60–5.20)
RBC: 3.64 M/uL (ref 3.60–5.20)
RDW-SD: 52.8 — ABNORMAL HIGH (ref 36.4–46.3)
RDW-SD: 52.4 — ABNORMAL HIGH (ref 36.4–46.3)
WBC: 11.7 10*3/uL — ABNORMAL HIGH (ref 4.0–11.0)
WBC: 10.9 10*3/uL (ref 4.0–11.0)

## 2020-11-05 LAB — MAGNESIUM
Magnesium: 1.5 mg/dL — ABNORMAL LOW (ref 1.6–2.6)
Magnesium: 1.5 mg/dL — ABNORMAL LOW (ref 1.6–2.6)
Magnesium: 1.8 mg/dL (ref 1.6–2.6)
Magnesium: 1.8 mg/dL (ref 1.6–2.6)

## 2020-11-05 LAB — AMMONIA
Ammonia, plasma: 28 umol/L (ref 11.2–31.7)
Ammonia: 28 umol/L (ref 11.2–31.7)

## 2020-11-05 LAB — METABOLIC PANEL, COMPREHENSIVE
ALT (SGPT): 12 U/L (ref 10–49)
ALT (SGPT): 11 U/L (ref 10–49)
AST (SGOT): 21 U/L (ref 0.0–33.9)
AST (SGOT): 34 U/L — ABNORMAL HIGH (ref 0.0–33.9)
Albumin: 2.3 gm/dl — ABNORMAL LOW (ref 3.4–5.0)
Albumin: 2.2 gm/dl — ABNORMAL LOW (ref 3.4–5.0)
Alk. phosphatase: 124 U/L — ABNORMAL HIGH (ref 46–116)
Alk. phosphatase: 129 U/L — ABNORMAL HIGH (ref 46–116)
Anion gap: 9 mmol/L (ref 5–15)
Anion gap: 12 mmol/L (ref 5–15)
BUN: 6 mg/dl — ABNORMAL LOW (ref 9–23)
BUN: <5 mg/dl — ABNORMAL LOW (ref 9–23)
Bilirubin, total: 1 mg/dl (ref 0.30–1.20)
Bilirubin, total: 0.9 mg/dl (ref 0.30–1.20)
CO2: 21 mEq/L (ref 20–31)
CO2: 17 mEq/L — ABNORMAL LOW (ref 20–31)
Calcium: 8.2 mg/dl — ABNORMAL LOW (ref 8.7–10.4)
Calcium: 7.9 mg/dl — ABNORMAL LOW (ref 8.7–10.4)
Chloride: 107 mEq/L (ref 98–107)
Chloride: 105 mEq/L (ref 98–107)
Creatinine: 0.58 mg/dl (ref 0.55–1.02)
Creatinine: 0.61 mg/dl (ref 0.55–1.02)
GFR est AA: >60
GFR est AA: >60
GFR est non-AA: >60
GFR est non-AA: >60
Glucose: 161 mg/dl — ABNORMAL HIGH (ref 74–106)
Glucose: 141 mg/dl — ABNORMAL HIGH (ref 74–106)
Potassium: 3.2 mEq/L — ABNORMAL LOW (ref 3.5–5.1)
Potassium: 4.2 mEq/L (ref 3.5–5.1)
Protein, total: 5.7 gm/dl (ref 5.7–8.2)
Protein, total: 5.9 gm/dl (ref 5.7–8.2)
Sodium: 137 mEq/L (ref 136–145)
Sodium: 134 mEq/L — ABNORMAL LOW (ref 136–145)

## 2020-11-05 LAB — C REACTIVE PROTEIN, QT: C-Reactive protein: 90 mg/L — ABNORMAL HIGH (ref 0.00–9.90)

## 2020-11-05 LAB — LD: LD: 506 U/L — ABNORMAL HIGH (ref 120–246)

## 2020-11-05 LAB — FIBRINOGEN
Fibrinogen: 525 mg/dl — ABNORMAL HIGH (ref 220–397)
Fibrinogen: 525 mg/dl — ABNORMAL HIGH (ref 220–397)

## 2020-11-05 LAB — FERRITIN
Ferritin: 271.3 ng/ml — ABNORMAL HIGH (ref 7.3–270.7)
Ferritin: 271.3 ng/ml — ABNORMAL HIGH (ref 7.3–270.7)

## 2020-11-05 LAB — PROCALCITONIN
PROCALCITONIN: 0.21 ng/ml (ref 0.00–0.50)
PROCALCITONIN: 0.21 ng/ml (ref 0.00–0.50)

## 2020-11-05 LAB — LEGIONELLA PNEUMOPHILA AG, URINE: Legionella Ag, urine: NEGATIVE

## 2020-11-05 LAB — S.PNEUMO AG, UR/CSF
Strep pneumo Ag, urine: NEGATIVE
Strep pneumo Ag, urine: NEGATIVE

## 2020-11-05 LAB — D DIMER: D DIMER: 3.62 ug/mL (FEU) — ABNORMAL HIGH (ref 0.01–0.50)

## 2020-11-05 LAB — TSH 3RD GENERATION
TSH: 6.218 u[IU]/mL — ABNORMAL HIGH (ref 0.550–4.780)
TSH: 6.218 u[IU]/mL — ABNORMAL HIGH (ref 0.550–4.780)

## 2020-11-05 LAB — LACTIC ACID
LACTIC ACID: 2.1 mmol/L (ref 0.5–2.2)
Lactic Acid: 2.1 mmol/L (ref 0.5–2.2)

## 2020-11-05 LAB — MRSA SCREEN - PCR (NASAL)
MRSA PCR SCREEN: NEGATIVE
MRSA PCR screen: NEGATIVE

## 2020-11-05 LAB — COMPREHENSIVE METABOLIC PANEL
ALT: 12 U/L (ref 10–49)
ALT: 11 U/L (ref 10–49)
AST: 21 U/L (ref 0.0–33.9)
AST: 34 U/L — ABNORMAL HIGH (ref 0.0–33.9)
Albumin: 2.3 gm/dl — ABNORMAL LOW (ref 3.4–5.0)
Albumin: 2.2 gm/dl — ABNORMAL LOW (ref 3.4–5.0)
Alkaline Phosphatase: 124 U/L — ABNORMAL HIGH (ref 46–116)
Alkaline Phosphatase: 129 U/L — ABNORMAL HIGH (ref 46–116)
Anion Gap: 9 mmol/L (ref 5–15)
Anion Gap: 12 mmol/L (ref 5–15)
BUN: 6 mg/dl — ABNORMAL LOW (ref 9–23)
BUN: <5 mg/dl — ABNORMAL LOW (ref 9–23)
CO2: 21 mEq/L (ref 20–31)
CO2: 17 mEq/L — ABNORMAL LOW (ref 20–31)
Calcium: 8.2 mg/dl — ABNORMAL LOW (ref 8.7–10.4)
Calcium: 7.9 mg/dl — ABNORMAL LOW (ref 8.7–10.4)
Chloride: 107 mEq/L (ref 98–107)
Chloride: 105 mEq/L (ref 98–107)
Creatinine: 0.58 mg/dl (ref 0.55–1.02)
Creatinine: 0.61 mg/dl (ref 0.55–1.02)
EGFR IF NonAfrican American: >60
EGFR IF NonAfrican American: >60
GFR African American: >60
GFR African American: >60
Glucose: 161 mg/dl — ABNORMAL HIGH (ref 74–106)
Glucose: 141 mg/dl — ABNORMAL HIGH (ref 74–106)
Potassium: 3.2 mEq/L — ABNORMAL LOW (ref 3.5–5.1)
Potassium: 4.2 mEq/L (ref 3.5–5.1)
Sodium: 137 mEq/L (ref 136–145)
Sodium: 134 mEq/L — ABNORMAL LOW (ref 136–145)
Total Bilirubin: 1 mg/dl (ref 0.30–1.20)
Total Bilirubin: 0.9 mg/dl (ref 0.30–1.20)
Total Protein: 5.7 gm/dl (ref 5.7–8.2)
Total Protein: 5.9 gm/dl (ref 5.7–8.2)

## 2020-11-05 LAB — CBC WITH AUTO DIFFERENTIAL
Basophils %: 0.8 % (ref 0–3)
Basophils %: 0.6 % (ref 0–3)
Eosinophils %: 1.6 % (ref 0–5)
Eosinophils %: 1.5 % (ref 0–5)
Hematocrit: 37.3 % (ref 37.0–50.0)
Hematocrit: 37.7 % (ref 37.0–50.0)
Hemoglobin: 12.2 gm/dl — ABNORMAL LOW (ref 13.0–17.2)
Hemoglobin: 12.3 gm/dl — ABNORMAL LOW (ref 13.0–17.2)
Immature Granulocytes: 2 % (ref 0.0–3.0)
Immature Granulocytes: 1.7 % (ref 0.0–3.0)
Lymphocytes %: 14.9 % — ABNORMAL LOW (ref 28–48)
Lymphocytes %: 15.8 % — ABNORMAL LOW (ref 28–48)
MCH: 34.1 pg (ref 25.4–34.6)
MCH: 33.8 pg (ref 25.4–34.6)
MCHC: 32.7 gm/dl (ref 30.0–36.0)
MCHC: 32.6 gm/dl (ref 30.0–36.0)
MCV: 104.2 fL — ABNORMAL HIGH (ref 80.0–98.0)
MCV: 103.6 fL — ABNORMAL HIGH (ref 80.0–98.0)
MPV: 10.8 fL — ABNORMAL HIGH (ref 6.0–10.0)
MPV: 11.1 fL — ABNORMAL HIGH (ref 6.0–10.0)
Monocytes %: 7.7 % (ref 1–13)
Monocytes %: 5.8 % (ref 1–13)
Neutrophils %: 73 % — ABNORMAL HIGH (ref 34–64)
Neutrophils %: 74.6 % — ABNORMAL HIGH (ref 34–64)
Nucleated RBCs: 0 (ref 0–0)
Nucleated RBCs: 0 (ref 0–0)
Platelets: 322 10*3/uL (ref 140–450)
Platelets: 335 10*3/uL (ref 140–450)
RBC: 3.58 M/uL — ABNORMAL LOW (ref 3.60–5.20)
RBC: 3.64 M/uL (ref 3.60–5.20)
RDW-SD: 52.8 — ABNORMAL HIGH (ref 36.4–46.3)
RDW-SD: 52.4 — ABNORMAL HIGH (ref 36.4–46.3)
WBC: 11.7 10*3/uL — ABNORMAL HIGH (ref 4.0–11.0)
WBC: 10.9 10*3/uL (ref 4.0–11.0)

## 2020-11-05 LAB — LEGIONELLA ANTIGEN, URINE: Legionella Antigen, Urine: NEGATIVE

## 2020-11-05 LAB — D-DIMER, QUANTITATIVE: D-Dimer, Quant: 3.62 ug/mL (FEU) — ABNORMAL HIGH (ref 0.01–0.50)

## 2020-11-05 LAB — C-REACTIVE PROTEIN: CRP: 90 mg/L — ABNORMAL HIGH (ref 0.00–9.90)

## 2020-11-05 LAB — LACTATE DEHYDROGENASE: LD: 506 U/L — ABNORMAL HIGH (ref 120–246)

## 2020-11-05 MED ORDER — PIPERACILLIN-TAZOBACTAM 4.5 GRAM IV SOLR
4.5 gram | Freq: Once | INTRAVENOUS | Status: AC
Start: 2020-11-05 — End: 2020-11-05
  Administered 2020-11-05: 16:00:00 via INTRAVENOUS

## 2020-11-05 MED ORDER — VANCOMYCIN IN 0.9 % SODIUM CHLORIDE 1 GRAM/250 ML IV
1 gram/250 mL | INTRAVENOUS | Status: DC
Start: 2020-11-05 — End: 2020-11-06
  Administered 2020-11-06: 11:00:00 via INTRAVENOUS

## 2020-11-05 MED ORDER — LEVOTHYROXINE 50 MCG TAB
50 mcg | ORAL | Status: DC
Start: 2020-11-05 — End: 2020-11-07
  Administered 2020-11-06 – 2020-11-07 (×2): via ORAL

## 2020-11-05 MED ORDER — MAGNESIUM SULFATE 2 GRAM/50 ML IVPB
2 gram/50 mL (4 %) | Freq: Once | INTRAVENOUS | Status: AC
Start: 2020-11-05 — End: 2020-11-05
  Administered 2020-11-05: 16:00:00 via INTRAVENOUS

## 2020-11-05 MED ORDER — IPRATROPIUM-ALBUTEROL 2.5 MG-0.5 MG/3 ML NEB SOLUTION
2.5 mg-0.5 mg/3 ml | Freq: Four times a day (QID) | RESPIRATORY_TRACT | Status: DC
Start: 2020-11-05 — End: 2020-11-07
  Administered 2020-11-05 – 2020-11-07 (×6): via RESPIRATORY_TRACT

## 2020-11-05 MED ORDER — DEXTROMETHORPHAN-GUAIFENESIN 10 MG-100 MG/5 ML SYRUP
100-10 mg/5 mL | Freq: Four times a day (QID) | ORAL | Status: DC | PRN
Start: 2020-11-05 — End: 2020-11-13
  Administered 2020-11-08 – 2020-11-09 (×2): via ORAL

## 2020-11-05 MED ORDER — METHYLPREDNISOLONE (PF) 40 MG/ML IJ SOLR
40 mg/mL | Freq: Three times a day (TID) | INTRAMUSCULAR | Status: DC
Start: 2020-11-05 — End: 2020-11-07
  Administered 2020-11-05 – 2020-11-07 (×6): via INTRAVENOUS

## 2020-11-05 MED ORDER — PHARMACY VANCOMYCIN NOTE
Freq: Once | Status: DC
Start: 2020-11-05 — End: 2020-11-07

## 2020-11-05 MED ORDER — PHARMACY VANCOMYCIN NOTE
Status: DC
Start: 2020-11-05 — End: 2020-11-07

## 2020-11-05 MED ORDER — LACTULOSE 10 GRAM/15 ML ORAL SOLUTION
10 gram/15 mL | Freq: Two times a day (BID) | ORAL | Status: DC
Start: 2020-11-05 — End: 2020-11-13
  Administered 2020-11-06 – 2020-11-11 (×10): via ORAL

## 2020-11-05 MED ORDER — VANCOMYCIN IN 0.9% SODIUM CHLORIDE 1.5 G/500 ML IV
1.5 g/500 mL | Freq: Once | INTRAVENOUS | Status: AC
Start: 2020-11-05 — End: 2020-11-05
  Administered 2020-11-05: 19:00:00 via INTRAVENOUS

## 2020-11-05 MED ORDER — SODIUM CHLORIDE 0.9 % IV PIGGY BACK
3.375 gram | Freq: Three times a day (TID) | INTRAVENOUS | Status: AC
Start: 2020-11-05 — End: 2020-11-12
  Administered 2020-11-05 – 2020-11-12 (×21): via INTRAVENOUS

## 2020-11-05 MED ORDER — POTASSIUM CHLORIDE SR 20 MEQ TAB, PARTICLES/CRYSTALS
20 mEq | Freq: Once | ORAL | Status: AC
Start: 2020-11-05 — End: 2020-11-05
  Administered 2020-11-05: 16:00:00 via ORAL

## 2020-11-05 MED FILL — PIPERACILLIN-TAZOBACTAM 3.375 GRAM IV SOLR: 3.375 gram | INTRAVENOUS | Qty: 3.38

## 2020-11-05 MED FILL — HYDROXYCHLOROQUINE 200 MG TAB: 200 mg | ORAL | Qty: 1

## 2020-11-05 MED FILL — MAGNESIUM SULFATE 2 GRAM/50 ML IVPB: 2 gram/50 mL (4 %) | INTRAVENOUS | Qty: 50

## 2020-11-05 MED FILL — PHARMACY VANCOMYCIN NOTE: Qty: 1

## 2020-11-05 MED FILL — POTASSIUM CHLORIDE SR 20 MEQ TAB, PARTICLES/CRYSTALS: 20 mEq | ORAL | Qty: 2

## 2020-11-05 MED FILL — LEVOTHYROXINE 125 MCG TAB: 125 mcg | ORAL | Qty: 1

## 2020-11-05 MED FILL — VANCOMYCIN IN 0.9% SODIUM CHLORIDE 1.5 G/500 ML IV: 1.5 g/500 mL | INTRAVENOUS | Qty: 500

## 2020-11-05 MED FILL — SOLU-MEDROL (PF) 40 MG/ML SOLUTION FOR INJECTION: 40 mg/mL | INTRAMUSCULAR | Qty: 1

## 2020-11-05 MED FILL — IPRATROPIUM-ALBUTEROL 2.5 MG-0.5 MG/3 ML NEB SOLUTION: 2.5 mg-0.5 mg/3 ml | RESPIRATORY_TRACT | Qty: 3

## 2020-11-05 MED FILL — LACTULOSE 20 GRAM/30 ML ORAL SOLUTION: 20 gram/30 mL | ORAL | Qty: 60

## 2020-11-05 MED FILL — METOPROLOL TARTRATE 25 MG TAB: 25 mg | ORAL | Qty: 1

## 2020-11-05 MED FILL — XIFAXAN 550 MG TABLET: 550 mg | ORAL | Qty: 1

## 2020-11-05 MED FILL — PIPERACILLIN-TAZOBACTAM 4.5 GRAM IV SOLR: 4.5 gram | INTRAVENOUS | Qty: 4.5

## 2020-11-05 MED FILL — LOVENOX 300 MG/3 ML SUBCUTANEOUS SOLUTION: 300 mg/3 mL | SUBCUTANEOUS | Qty: 0.7

## 2020-11-05 NOTE — Progress Notes (Signed)
New order noted to straight cath if PVR >560ml. Pt still has not voided. Pt bladder scanned = . Pt straight cathed, out. Urine sample sent.

## 2020-11-05 NOTE — Progress Notes (Signed)
[facility] Dosing Services: Vancomycin    Consult for Vancomycin Dosing by Pharmacy by Dr. Emmit Alexanders  Consult provided for this 87 y.o. year old female , for indication of HAP.  Day of Therapy: 1    Ht Readings from Last 1 Encounters:   10/21/20 167.6 cm (66")        Wt Readings from Last 1 Encounters:   11/04/20 70.1 kg (154 lb 8.7 oz)        Other Current Antibiotics Zosyn   Significant Cultures N/A   Serum Creatinine Lab Results   Component Value Date/Time    Creatinine 0.58 11/04/2020 07:27 PM      Creatinine Clearance Estimated Creatinine Clearance: 55 mL/min (by C-G formula based on SCr of 0.58 mg/dL).   BUN Lab Results   Component Value Date/Time    BUN 6 (L) 11/04/2020 07:27 PM      WBC Lab Results   Component Value Date/Time    WBC 11.7 (H) 11/04/2020 07:27 PM      H/H Lab Results   Component Value Date/Time    HGB 12.2 (L) 11/04/2020 07:27 PM      Platelets Lab Results   Component Value Date/Time    PLATELET 322 11/04/2020 07:27 PM      Temp 98.1 F (36.7 C)     Start Vancomycin therapy, with loading dose of 1500 mg followed with maintenance dose of 1000 mg every 18 hours.    Dose calculated to approximate a therapeutic trough of 15-20 mcg/mL with a trough draw scheduled for 8/25 at 1800.    Pharmacy to follow daily and will make changes to dose and/or frequency based on clinical status.    Pharmacist Maralyn Sago, PharmD, MBA, BCPS

## 2020-11-05 NOTE — Progress Notes (Signed)
Progress Notes by Dorise Hiss., PT at 11/05/20 1802                Author: Dorise Hiss., PT  Service: Physical Therapy  Author Type: Physical Therapist       Filed: 11/05/20 1813  Date of Service: 11/05/20 1802  Status: Addendum          Editor: Dorise Hiss., PT (Physical Therapist)          Related Notes: Original Note by Dorise Hiss., PT (Physical Therapist) filed at 11/05/20 1809               PHYSICAL THERAPY RE-EVALUATION/TREATMENT      Patient: [name] [name] (87 y.o. female)   Room: 2215/2215      Date: 11/05/2020   Start Time:  1605   End Time:  1645      Primary Diagnosis: Metabolic encephalopathy [G93.41]   Multiple falls [R29.6]   Contusion of left hip [S70.02XA]   Left elbow contusion [S50.02XA]            Precautions: falls, COVID (+)          Isolation:   Droplet Plus       MDRO: COVID-19        Orders reviewed, chart reviewed, and initial evaluation completed on Britania K Barnard. Cleared by RN.         ASSESSMENT :   Based on the objective data described below, the patient presents with        - eyes open during PT session, nonsensical responses. Occasional appropriate response   - compliant w PROM therex, able to progress to AROM   - limited B knee flexion   - limited dt cognitive deficits   - difficulty w following commands and staying on task, frequent redirection   Pt require min assist in bed mobility.   Pt need min assist in sit to stand.   Demo poor walking balance   Pt able to walk 4 ft forward + 4 ft back 2x using RW with min assist   Pt is high risk of falling, limited mobility due to generalized weakness.   Recommend SNF upon dc for gait transfer, balance and strengthening exs.          Functional Status Score    Task   Score   1.     Rolling  6 - Use of bed rail or object to pull on      2. Supine to Sit Transfer  4 - Min assist (patient performs 75% or more of the work)      3. Sit to Stand Transfer  4 - Min assist (patient performs 75% or more of the work)      4.  Sitting Edge of Bed  6 - Requires hands to balance      5. Walking  1 - Walks <50 feet with the assistance of 1 person OR requires the assistance of 2 people to assist with ambulation of any distance      TOTAL SCORE:  21      INITIAL TOTAL SCORE:                Progression toward goals:   []           Improving appropriately and progressing  toward goals   []           Improving slowly and progressing toward  goals   [x]   Not making progress toward goals      Patient will benefit from skilled intervention to address the above impairments.   Patients rehabilitation potential is considered to be Fair    GOALS:   PT goals: (1 week)            1. Pt will be independent with bed mobility  in preparation for OOB activities   2. Pt will be able to transfer with supervision in preparation for OOB activities and ambulation.   3. Pt will be able to ambulate a distance of 100 feet using least restrictive device with SBA x 1 to promote functional independence   4. Pt will be supervision with LE exercises x 10-15 reps each to increase strength and endurance   5. Increase strength BLE by 1/2 to1 grade higher to promote functional independence, improve ADLs/mobility and reduce risk for falls   6. Improve standing balance to fair plus to promote upright posture, functional independence, improve ADLs/mobility and reduce risk for falls   7. Tolerate OOB to chair for all meals   8. Patient will demonstrate good activity tolerance during functional activities.    9. Patient will state/observe falls precautions   10. Patient will utilize energy conservation techniques during functional activities   11. Increase FSS score to 25/35  to demonstrate increased independence with functional mobility              PLAN :   Planned Interventions:   Functional mobility training Gait Training Therapeutic exercises Therapeutic activities AD training Patient/caregiver education      Frequency/Duration: Patient will be followed by physical  therapy 3x / Week and 5x / Week to address goals.      Recommendations:   Physical Therapy and Occupational Therapy   Discharge Recommendations: SNF   Further Equipment Recommendations for Discharge: TBD                 SUBJECTIVE:     Patient agreed to PT, reported that she is tired after walking        OBJECTIVE DATA SUMMARY:        Patient found: Bed, semirec      Pain Assessment before PT session: None observed and None reported   Pain Assessment after PT session: none observed              COGNITIVE STATUS:        Mental Status: alert and oriented x 2   Communication: limited    Follows commands: impaired.   General Cognition: impaired sequencing, slow processing, delayed responses, and impaired safety.           Functional Mobility and Balance Status:     Supine to sit: min assist   Sit to supine: min assist   Sit to stand: min assist   Stand to sit: min assist      Gait:   Min assist using a Rw up to 4 ft forward+ 42ft back 2x     Therapeutic Exercises:         Lower Extremities:   Supine, Glut Set, Quad Set, Heel Slide, Straight leg raise, Hip ab/duction, Ankle pumps, and Cueing, 10reps, 2 sets            Activity Tolerance:    poor      Final Location:    bed, bed alarm, all needs close, and agrees to call for assistance        COMMUNICATION/EDUCATION:     Education:  Patient, benefit of activity while hospitalized, importance of OOB   Barriers to Learning/Limitations: yes;  cognitive      Please refer to care plan and patient education section for further details.      Thank you for this referral.   Dorise Hiss, PT

## 2020-11-05 NOTE — Progress Notes (Signed)
Subjective:  Symptoms:  Stable.  She reports malaise.    Diet:  Adequate intake.  No nausea.    Activity level: Impaired due to weakness.    Pain:  She reports no pain.        Temp:  [96.3 ??F (35.7 ??C)-98.5 ??F (36.9 ??C)]   Pulse (Heart Rate):  [63-90]   BP: (107-162)/(66-95)   Resp Rate:  [17-21]   O2 Sat (%):  [92 %-98 %]   Weight:  [70.1 kg (154 lb 8.7 oz)]   No intake/output data recorded.  08/21 1901 - 08/23 0700  In: 900 [P.O.:900]  Out: 550 [Urine:550]      Objective:  General Appearance:  Comfortable and in no acute distress.    Vital signs: (most recent): Blood pressure 117/73, pulse 70, temperature 98.1 ??F (36.7 ??C), resp. rate 19, height 5\' 6"  (1.676 m), weight 70.1 kg (154 lb 8.7 oz), SpO2 96 %, not currently breastfeeding.  Vital signs are normal.    Output: Producing urine.    Lungs:  Normal effort.    Heart: Normal rate.  Irregular rhythm.  (A Fib 65)  Chest: Symmetric chest wall expansion.   Abdomen: Abdomen is soft.  Bowel sounds are normal.     Neurological: Patient is alert.    Pupils:  Pupils are equal, round, and reactive to light.    Skin:  Warm.        Principal Problem:    Atrial fibrillation (HCC) (10/31/2020)      Overview: ? Noted this AM (10/31/20)      ? Rate controlled (84 BPM)      ? No previously documented hx of AFIB      ? CHADS2VASc = 6      ? ECHO:  EF 75%< LV 25/44, S/PW 9/10, LAVI 28, Mild MR, Mod TR, RVSP 43    Active Problems:    COVID-19 (10/21/2020)      Overview: 10/21/20 SARS CoV-2 detected      Metabolic encephalopathy (10/21/2020)      Contusion of left hip (10/21/2020)      Left elbow contusion (10/21/2020)      Multiple falls (10/21/2020)      Other partial intestinal obstruction (HCC) (10/31/2020)      Overview: 10/30/20 CT Small bowel obstruction secondary to presumed small bowel       containing      umbilical hernia. Stranding within the hernia sac suggesting engorgement.       No      pneumatosis.      11/01/20 SURG We will continue to follow the patient to ensure she does not        develop worsening incarceration and bowel obstruction from the hernia.        Currently if we can delay the surgery she would likely benefit from       decreased risk of postoperative complications          Assessment & Plan    Charts and notes reviewed.  [name] [name] is a 87 y.o. female with:      #1.  Atrial fibrillation.  Persistent.  Rate controlled.  #2.  No known history of symptoms suggestive of coronary artery disease.  #3.  COVID-positive minimal symptoms.  On treatment.  #4.  Incarcerated umbilical hernia.  Diet being advanced.  Conservative management.        REC:         #1.  Agree with metoprolol.  #2.  Agree with conservative management and advancing diet.  #3.  May switch to oral anticoagulation such as Eliquis if no surgery is being scheduled  #4.  Monitor and optimize electrolytes.  #5.  Will follow as needed.

## 2020-11-05 NOTE — Progress Notes (Signed)
 NUTRITION RECOMMENDATIONS:   Recommend continue no added salt, low fiber diet per MD. Encourage PO intake.   Recommend Ensure Enlive BID (350 kcals, 20 g protein each) and Magic Cup daily (provides 290 kcals, 9 g protein each).  Daily wts to trend.   Discharge recommendations: 2 g Na    NUTRITION FOLLOW UP    Current Diet Order: ADULT DIET Regular; No Salt Added (3-4 gm); Low Fiber    Current Intake: []  N/A- NPO    []  very poor     [x]  poor      []  fair      []  good  Pt on cardiac diet 8/8-8/14. Clear Liquid diet 8/15-8/17. Full Liquid 8/18. Advanced to easy to chew diet 8/19. Last meal documented on 8/22 of 1-25%. Provides 487 kcal (32%), 25 g protein (30%) of pt's estimated needs.      Pertinent Medications: NS with KCl @ 40 mL/hr, cordarone, lactulose, synthroid (FDI), lopressor     Pertinent Labs: 8/18 Na 135 L, Ca 8.2 (no albumin to correct0, Mag 1.4 L     Weight: Initial admit wt of 76.2 kg (8/8). Lowest BW this admit of 70.1 kg (8/22). -8% x 15 days. Pt currently +1.6 L per I/O. Will continue to monitor weight trends.   Last 3 Recorded Weights in this Encounter    10/31/20 0148 11/01/20 0755 11/04/20 0252   Weight: 74.2 kg (163 lb 9.3 oz) 74.2 kg (163 lb 9.3 oz) 70.1 kg (154 lb 8.7 oz)       BMI: Body mass index is 24.94 kg/m.    Estimated Daily Nutrition Needs: 70.1 kg CBW  1542 - 1893 kcals (22-27 kcal/kg)  84 - 105 g protein (1.2-1.5 g/kg)  1753 mL fluid (25 ml/kg or per MD)    Physical Assessment:  GI Symptoms:   no clinical signs of a small bowel obstruction related to the umbilical hernia.  Patient had the bowel movement and the flatus earlier today per Surgery 8/22  +cirrhosis  Last Bowel Movement Date: 11/05/20  Stool Appearance: Loose  Abdominal Assessment: Intact, Obese, Soft  Bowel Sounds: Active   Chewing/Swallowing Issues:   None indicated; on Regular diet  Skin Integrity:   Stage 2 PI to buttocks per flowsheets  Fluid Accumulation:   Edema  LLE: 1+  RLE: 1+    Physical Assessment:   Muscle  Wasting:  Pt not seen at this time in attempt to reduce unecessary exposure, pt COVID + Fat Wasting:  Pt not seen at this time in attempt to reduce unecessary exposure, pt COVID +     Assessment of Current MNT: Diet is adequate if intake averages 75% at most meals - recommend adding oral supplement BOD to increase protein-calorie intake opportunity.      Nutrition Diagnosis:   Severe protein-calorie malnutrition in the context of acute illness/injury related to inability to consume sufficient energy 2/2 poor appetite as evidenced by <50% of estimated needs x >5 days, >2% weight loss x 1 week, >5% weight loss x 1 mont. (updated)     Increased nutrient needs (kcal, protein) related to metabolic demand for catabolic illness / wound healing  as evidenced by COVID-19, cirrhosis, stage 2 PI to buttocks. (continues)    Nutrition Recommendation:   Recommend continue no added salt, low fiber diet per MD. Encourage PO intake.   Recommend Ensure Enlive BID (350 kcals, 20 g protein each) and Magic Cup daily (provides 290 kcals, 9 g protein each).  Daily  wts to trend.   Discharge recommendations: 2 g Na    Monitoring and Evaluation: Wt trend, PO intake, nutrition-related labs, and s/sx of new skin concerns; will f/u per policy.    Nutrition Goals: Pt to meet/tolerate >75% of estimated needs, maintain weight throughout LOS, BG control <180, improvement skin integrity.    Nutrition Level of Care: []  Low     []  Moderate     [x]  High    Progress Towards Nutrition Goals: []   Met/Ongoing     []   Progressing Appropriately     [x]   Progressing Slowly     []   Not Progressing    Code Status: Full Code       Sid Cork, MS, RD  Pager: [phone]  Office: [phone]

## 2020-11-05 NOTE — Progress Notes (Signed)
Per night shift RN patient bladder scan@ 0557,=572 ml. Patient still has not voided, bladder scan @ 0951, =786 ml.     Dr. Emmit Alexanders made aware of above, per MD continued to monitor. Straight cath if >1000 ml

## 2020-11-05 NOTE — Progress Notes (Signed)
Problem: Body Temperature -  Risk of, Imbalanced  Goal: Ability to maintain a body temperature within defined limits  Outcome: Progressing Towards Goal     Problem: Isolation Precautions - Risk of Spread of Infection  Goal: Prevent transmission of infectious organism to others  Outcome: Progressing Towards Goal

## 2020-11-05 NOTE — Progress Notes (Signed)
 OCCUPATIONAL THERAPY TREATMENT       Patient: [name] [name] (87 y.o. female)  Room: 2215/2215    Primary Diagnosis: Metabolic encephalopathy [G93.41]  Multiple falls [R29.6]  Contusion of left hip [S70.02XA]  Left elbow contusion [S50.02XA]       Date of Admission: 10/21/2020   Length of Stay:  15 day(s)  Insurance: Payor: [facility] / Plan: [facility] MEDICARE A & B / Product Type: Medicare /      Date: 11/05/2020  In time:  0800      Out time:  0816    Precautions: Falls, COVID +   Ordered Weight Bearing Status: None    Isolation:  Droplet Plus       MDRO: COVID-19     ASSESSMENT:    Based on the objective data described below, the patient presents with     -lethargic  -required verbal encouragement to participate, max to initiate for changing brief  -pt required therapist to hold cup and place straw in mouth for drinking this session secondary to fatigue  -decreased safety awareness of optimum bed positioning during eating/drinking  -decreased activity tolerance to sustained activity  -deconditioned  -general muscle weakness affecting ADL participation and follow through  -declined changing positions for pressure relief  -declined attempting to sit EOB for grooming/bathing activities    affecting patient's ability to safely and independently perform basic ADLs/IADLs.     Patient will benefit from skilled occupational therapy intervention to address the above impairments.     Patient's rehabilitation potential is considered to be Good.     PLAN: 1-5 week X 4 weeks    Recommendations:  Recommend continued skilled occupational therapy intervention to address above impairments.  Recommend out of bed activity to counteract ill effects of bedrest, with assistance from staff as needed.    Discharge Recommendations: Skilled Nursing Facility  Further Equipment Recommendations for Discharge: no needs anticipated; has DME available at home     Education/ communication:     Barriers to Learning/Limitations:  None  Education  provided to: patient on (+) role of OT, (+) OT plan of care, (+) Instructed patient in the benefits of maintaining activity tolerance, functional mobility, and independence with self care tasks during acute stay  to ensure safe return home and to baseline. Encouraged patient to increase frequency and duration OOB, be out of bed for all meals, perform daily ADLs (as approved by RN/MD regarding bathing etc), and performing functional mobility to/from bathroom with staff assistance as needed., (+) instructed patient on the importance of activity while hospitalized to prevent a decline in function, (+) encouraged patient to sit up in chair for 45 (+) minutes or as tolerated 2-3 times a day, with staff assistance as needed, (+) the importance of maintaining UE muscle strength and activity tolerance while hospitalized to prevent a decline in function, (+) staff assistance with mobility, (+) change positions frequently, (+) functional mobility, (+) discharge disposition/recommendations, ADLs, (+) safety, (+) breathing exercises, (+) energy conservation techniques, semi fowler for eating/drinking  Educational Handouts issued: none this session  Patient / Family readiness to learn indicated by: verbalized understanding, no evidence of learning, needs reinforcement    SUBJECTIVE:   Patient I can't do it.    OBJECTIVE DATA SUMMARY:   Orders, labs, occupational therapy and chart reviewed on [name] [name]. Communicated with nursing staff. Patient cleared to participate in Occupational Therapy treatment.    Most recent value for oxygen in flow sheets:  O2 Device: None (  Room air) (11/04/20 2144)  O2 Flow Rate (L/min): 2 l/min (11/02/20 0234)     Patient found: Supine in bed, asleep, difficult to wake and maintain alertness, pt continually drifting off to sleep during session.    Pain assessment: 0/10    Occupational Therapy Goals:   OT goals initiated 10/22/2020 and will be met by patient within 10 sessions      - Patient will  perform Upper Body Dressing with supervision and DME/AE PRN.   - Patient will perform Lower Body Dressing with modified independence and DME/AE PRN.  - Patient will perform all aspects of toileting with modified independence and DME/AE PRN.  - Patient will perform Upper Body Bathing with supervision and DME/AE PRN.  - Patient will perform Lower Body Bathing with minimum assistance and DME/AE PRN  - Patient will navigate a community distance (>50 feet) to address community mobility with supervision and DME PRN.    ______________________________________________________________________________  Cognitive:   Mental status:   Orientation: Patient is oriented x 3   Neuro state: lethargic, difficult to maintain alert state  Communication: normal, speech and language intact  Attention Span:  Fair, needs continual verbal cueing  Follows commands: fair, needs verbal and tactile cueing for initiation  Safety/Judgement: needs cueing for safety and precautions, poor safety awareness this session  General Cognition: slow processing and delayed responses.  Hearing: grossly intact.  Vision:  grossly intact.    Activities of Daily Living:  Pt required Max verbal encouragement for participation in session.   Max A to initiate LB bathing for brief change. Barrier cream applied to buttocks and groin area d/t redness.  pt declined further bathing, grooming, oral care, and dressing despite verbal encouragement to initiate.  Drinking:  Required therapist to hold cup and place straw in mouth, pt eyes closed throughout.  Pt declined sitting in semi fowler position, allowed therapist to placed [facility] at about 30 degrees at end of session.    Mobility:  Bed mobility rolling L to R.  Max A to initiate, Min A thereafter with tactile and verbal cueing  Total A for repositioning to scoot HOB    Transfers:  Pt declined    Balance:  Unable to determine    Activity Tolerance:   Poor, drowsy, eyes closed majority of session    Therapeutic Exercises:    None      Final Location: Supine in bed, HOB elevated,bed alarm (+), all OT needs met, no c/o pain, call bell in reach    Olam Gaskins, OTA  November 05, 2020

## 2020-11-05 NOTE — Progress Notes (Signed)
Progress Note             Daily Progress Note: 11/05/2020    Assessment/Plan:          Pt with cough, occasionally productive thick clear phlegm.   Denies sob, chest pain, pleuritic or otherwise.   Denies adbominal pain, N/V.   Diarrhea has calmed and she has been able to resume lower dose Lactulose.   Joint pains left hand incidentally better with addition of steroids.   Had to have foley replaced.     Afebrile.   O2 sats 96% on RA.   AF with controlled vent rate.  Oriented to place, year, month.     Less wheezing.  Rhonchi bilaterally today.  CXR not formally read yet.   Minimal right effusion.       WBCs nl.   H/H stable.   Ammonia 78 (with missing just a few doses Lactulose).   Inflammatory markers better.          Probable Covid-19 pneumonitis +/- inflammatory lung reaction, somewhat later presentation 14 days after initial + test.         -   Strep/Legionella ag neg.   MRSA swab neg.   Sputum Cx if able would be helpful.      -   Speech eval pending for completeness.   No symptoms of dysphagia.      -   DC Vanco.   Continue Zosyn for now.       -   Continue IV solumedrol, nebs, close monitoring for any deterioration.     - s/p 2 vaccines and one booster.  Right pleural effusion.   Very small on CXR this am.   Doesn't need thoracentesis at this time.   Resume Lovenox.    Umbilical hernia.   Tolerating regular diet.   Switched to low fiber as per surgery.  Tentative plan for subacute surgical repair in a few weeks.   Appreciate surgery following.             (S/p sigmoid resection and bladder repair 06/2003, prior TAH, then subsequent BSO).  4.   Acute urinary retention.   Foley replaced.   Add Flomax.   Voiding trial again in a few days.  5.    New onset atrial fibrillation with CVR.   As per CVAL.           Echo 8/18:   EF 75%   est RVSP:32mHg     Pt is a pretty high fall risk.   Concerned if/when she eventually can go back to assisted living on her own.   Can see how she does with rehab.  6.   Hepatic  encephalopathy, hallucinations.       Appreciate neurology's assessment.   Possible concurrent Lewy Body Dementia.  7.   Severe protein-calorie malnutrition.   8.   Cirrhosis of liver (Hemochromatosis, Methotrexate for several years for RA) with hepatic encephalopathy and mild auto-anticoagulation (INR 1.4 on admission).    Continue dual tx with Lactulose and Xifaxin for now.     9.   Multiple falls, 2 within 24 hrs of admission.    Hx multiple prior orthopedic surgeries.  10   Hypothyroidism, inadequate replacement.   Dose increased from 100 to 125 mcg this admission.  TSH improved, increase 138 mcg.  11.    Hemochromatosis receiving periodic phlebotomies (~ 2/year) via Dr. FKeturah Barre    12.   RA, on Methotrexate approximately the last five years,  DC'd about a month ago.  Continue Plaquenil.   Would have to wait a few weeks for a new DMARD with #1.   (Dr. Kalman Jewels)  13   Hx subdural hematoma 08/2017, not requiring surgery.  14.  HTN, currently normotensive off meds.         Subjective:      Review of Systems:  ROS    Objective:   Physical Exam: Physical Exam  Cardiovascular:      Rate and Rhythm: Normal rate. Rhythm irregular.   Pulmonary:      Effort: Pulmonary effort is normal.      Breath sounds: Rhonchi present.   Abdominal:      Tenderness: There is no abdominal tenderness. There is no guarding or rebound.      Hernia: A hernia is present.   Musculoskeletal:      Right lower leg: No edema.      Left lower leg: No edema.   Neurological:      Mental Status: She is alert.       Visit Vitals  BP 117/73 (BP 1 Location: Left upper arm, BP Patient Position: Sitting)   Pulse 70   Temp 98.1 ??F (36.7 ??C)   Resp 19   Ht '5\' 6"'  (1.676 m)   Wt 70.1 kg (154 lb 8.7 oz)   SpO2 96%   Breastfeeding No   BMI 24.94 kg/m??    O2 Flow Rate (L/min): 2 l/min O2 Device: None (Room air)    Temp (24hrs), Avg:97.5 ??F (36.4 ??C), Min:96.3 ??F (35.7 ??C), Max:98.1 ??F (36.7 ??C)    No intake/output data recorded.   08/21 1901 - 08/23 0700  In: 900  [P.O.:900]  Out: 550 [Urine:550]      Data Review:       24 Hour Results:  Recent Results (from the past 24 hour(s))   METABOLIC PANEL, COMPREHENSIVE    Collection Time: 11/04/20  7:27 PM   Result Value Ref Range    Potassium 3.2 (L) 3.5 - 5.1 mEq/L    Chloride 107 98 - 107 mEq/L    Sodium 137 136 - 145 mEq/L    CO2 21 20 - 31 mEq/L    Glucose 161 (H) 74 - 106 mg/dl    BUN 6 (L) 9 - 23 mg/dl    Creatinine 0.58 0.55 - 1.02 mg/dl    GFR est AA >60.0      GFR est non-AA >60      Calcium 8.2 (L) 8.7 - 10.4 mg/dl    Anion gap 9 5 - 15 mmol/L    AST (SGOT) 21.0 0.0 - 33.9 U/L    ALT (SGPT) 12 10 - 49 U/L    Alk. phosphatase 124 (H) 46 - 116 U/L    Bilirubin, total 1.00 0.30 - 1.20 mg/dl    Protein, total 5.7 5.7 - 8.2 gm/dl    Albumin 2.3 (L) 3.4 - 5.0 gm/dl   AMMONIA    Collection Time: 11/04/20  7:27 PM   Result Value Ref Range    Ammonia, plasma 28.0 11.2 - 31.7 umol/L   CBC WITH AUTOMATED DIFF    Collection Time: 11/04/20  7:27 PM   Result Value Ref Range    WBC 11.7 (H) 4.0 - 11.0 1000/mm3    RBC 3.58 (L) 3.60 - 5.20 M/uL    HGB 12.2 (L) 13.0 - 17.2 gm/dl    HCT 37.3 37.0 - 50.0 %    MCV 104.2 (H) 80.0 -  98.0 fL    MCH 34.1 25.4 - 34.6 pg    MCHC 32.7 30.0 - 36.0 gm/dl    PLATELET 322 140 - 450 1000/mm3    MPV 10.8 (H) 6.0 - 10.0 fL    RDW-SD 52.8 (H) 36.4 - 46.3      NRBC 0 0 - 0      IMMATURE GRANULOCYTES 2.0 0.0 - 3.0 %    NEUTROPHILS 73.0 (H) 34 - 64 %    LYMPHOCYTES 14.9 (L) 28 - 48 %    MONOCYTES 7.7 1 - 13 %    EOSINOPHILS 1.6 0 - 5 %    BASOPHILS 0.8 0 - 3 %   MAGNESIUM    Collection Time: 12/02/2020  7:27 PM   Result Value Ref Range    Magnesium 1.5 (L) 1.6 - 2.6 mg/dL   TSH 3RD GENERATION    Collection Time: 12-02-2020  7:27 PM   Result Value Ref Range    TSH 6.218 (H) 0.550 - 4.780 uIU/mL               Problem List:  Problem List as of 11/05/2020 Date Reviewed: 12-02-2020            Codes Class Noted - Resolved    COVID-19 ICD-10-CM: U07.1  ICD-9-CM: 079.89  10/21/2020 - Present    Overview Signed 11/01/2020  1:27  PM by Lillia Dallas, MD     10/21/20 SARS CoV-2 detected             * (Principal) Atrial fibrillation ([place]) ICD-10-CM: I48.91  ICD-9-CM: 427.31  10/31/2020 - Present    Overview Signed 11/01/2020  1:18 PM by Lillia Dallas, MD     Noted this AM (10/31/20)  Rate controlled (84 BPM)  No previously documented hx of AFIB  CHADS2VASc = 6  ECHO:  EF 75%< LV 25/44, S/PW 9/10, LAVI 28, Mild MR, Mod TR, RVSP 43             Other partial intestinal obstruction (HCC) ICD-10-CM: P82.423  ICD-9-CM: 560.89  10/31/2020 - Present    Overview Addendum 11/01/2020  1:28 PM by Lillia Dallas, MD     10/30/20 CT Small bowel obstruction secondary to presumed small bowel containing  umbilical hernia. Stranding within the hernia sac suggesting engorgement. No  pneumatosis.  11/01/20 SURG We will continue to follow the patient to ensure she does not develop worsening incarceration and bowel obstruction from the hernia.  Currently if we can delay the surgery she would likely benefit from decreased risk of postoperative complications             Metabolic encephalopathy NTI-14-ER: G93.41  ICD-9-CM: 348.31  10/21/2020 - Present        Contusion of left hip ICD-10-CM: S70.02XA  ICD-9-CM: 924.01  10/21/2020 - Present        Left elbow contusion ICD-10-CM: S50.02XA  ICD-9-CM: 923.11  10/21/2020 - Present        Multiple falls ICD-10-CM: R29.6  ICD-9-CM: V15.88  10/21/2020 - Present        Deep venous thrombosis (HCC) ICD-10-CM: I82.409  ICD-9-CM: 453.40  Unknown - Present        Subdural hematoma caused by concussion ([place]) ICD-10-CM: X54.0G8Q  ICD-9-CM: 852.29  08/27/2017 - Present        Subdural hematoma ([place]) ICD-10-CM: P61.9J0D  ICD-9-CM: 432.1  08/27/2017 - Present       Medications reviewed  Current Facility-Administered Medications   Medication Dose  Route Frequency    lactulose (CHRONULAC) 10 gram/15 mL solution 30 mL  20 g Oral BID    [START ON 11/06/2020] levothyroxine (SYNTHROID) tablet 138 mcg  138 mcg Oral 6am    magnesium sulfate 2 g/50 ml IVPB  (premix or compounded)  2 g IntraVENous ONCE    potassium chloride (K-DUR, KLOR-CON M20) SR tablet 40 mEq  40 mEq Oral ONCE    albuterol-ipratropium (DUO-NEB) 2.5 MG-0.5 MG/3 ML  3 mL Nebulization Q6H RT    piperacillin-tazobactam (ZOSYN) 3.375 g in 0.9% sodium chloride (MBP/ADV) 100 mL MBP  3.375 g IntraVENous Q8H    metoprolol tartrate (LOPRESSOR) tablet 25 mg  25 mg Oral Q8H    enoxaparin (LOVENOX) injection 70 mg  1 mg/kg SubCUTAneous Q12H    [Held by provider] lisinopriL (PRINIVIL, ZESTRIL) tablet 40 mg  40 mg Oral DAILY    rifAXIMin (XIFAXAN) tablet 550 mg  550 mg Oral BID    [Held by provider] furosemide (LASIX) tablet 40 mg  40 mg Oral DAILY    albuterol-ipratropium (DUO-NEB) 2.5 MG-0.5 MG/3 ML  3 mL Nebulization Q4H PRN    [Held by provider] magnesium oxide (MAG-OX) tablet 400 mg  400 mg Oral DAILY    [Held by provider] melatonin tablet 3 mg  3 mg Oral QHS    [Held by provider] potassium chloride (K-DUR, KLOR-CON M20) SR tablet 40 mEq  40 mEq Oral DAILY    [Held by provider] calcium-vitamin D (OS-CAL +D3) 500 mg-200 unit per tablet 1 Tablet  1 Tablet Oral DAILY WITH LUNCH    hydrOXYchloroQUINE (PLAQUENIL) tablet 200 mg  200 mg Oral BID    [Held by provider] folic acid (FOLVITE) tablet 1 mg  1 mg Oral DAILY    naloxone (NARCAN) injection 0.1 mg  0.1 mg IntraVENous PRN    acetaminophen (TYLENOL) tablet 650 mg  650 mg Oral Q4H PRN    Or    acetaminophen (TYLENOL) solution 650 mg  650 mg Oral Q4H PRN    Or    acetaminophen (TYLENOL) suppository 650 mg  650 mg Rectal Q4H PRN    [Held by provider] HYDROcodone-acetaminophen (NORCO) 5-325 mg per tablet 1 Tablet  1 Tablet Oral Q6H PRN    ondansetron (ZOFRAN) injection 4 mg  4 mg IntraVENous Q4H PRN       Care Plan discussed with: Patient/Family    Total time spent with patient: 45 minutes.    Colen Darling, MD  November 05, 2020

## 2020-11-06 ENCOUNTER — Inpatient Hospital Stay: Admit: 2020-11-06 | Payer: MEDICARE | Primary: Internal Medicine

## 2020-11-06 LAB — GLUCOSE, POC
Glucose (POC): 189 mg/dL — ABNORMAL HIGH (ref 65–105)
Glucose (POC): 223 mg/dL — ABNORMAL HIGH (ref 65–105)
Glucose (POC): 226 mg/dL — ABNORMAL HIGH (ref 65–105)
Glucose (POC): 279 mg/dL — ABNORMAL HIGH (ref 65–105)

## 2020-11-06 LAB — C REACTIVE PROTEIN, QT: C-Reactive protein: 77 mg/L — ABNORMAL HIGH (ref 0.00–9.90)

## 2020-11-06 LAB — CBC WITH AUTOMATED DIFF
BASOPHILS: 0.5 % (ref 0–3)
EOSINOPHILS: 0 % (ref 0–5)
HCT: 32.7 % — ABNORMAL LOW (ref 37.0–50.0)
HGB: 11.2 gm/dl — ABNORMAL LOW (ref 13.0–17.2)
IMMATURE GRANULOCYTES: 1.2 % (ref 0.0–3.0)
LYMPHOCYTES: 7.4 % — ABNORMAL LOW (ref 28–48)
MCH: 34.4 pg (ref 25.4–34.6)
MCHC: 34.3 gm/dl (ref 30.0–36.0)
MCV: 100.3 fL — ABNORMAL HIGH (ref 80.0–98.0)
MONOCYTES: 1.3 % (ref 1–13)
MPV: 11.3 fL — ABNORMAL HIGH (ref 6.0–10.0)
NEUTROPHILS: 89.6 % — ABNORMAL HIGH (ref 34–64)
NRBC: 0 (ref 0–0)
PLATELET: 335 10*3/uL (ref 140–450)
RBC: 3.26 M/uL — ABNORMAL LOW (ref 3.60–5.20)
RDW-SD: 50 — ABNORMAL HIGH (ref 36.4–46.3)
WBC: 9.3 10*3/uL (ref 4.0–11.0)

## 2020-11-06 LAB — D DIMER: D DIMER: 2.8 ug/mL (FEU) — ABNORMAL HIGH (ref 0.01–0.50)

## 2020-11-06 LAB — METABOLIC PANEL, BASIC
Anion gap: 9 mmol/L (ref 5–15)
BUN: 7 mg/dl — ABNORMAL LOW (ref 9–23)
CO2: 21 mEq/L (ref 20–31)
Calcium: 7.9 mg/dl — ABNORMAL LOW (ref 8.7–10.4)
Chloride: 106 mEq/L (ref 98–107)
Creatinine: 0.58 mg/dl (ref 0.55–1.02)
GFR est AA: >60
GFR est non-AA: >60
Glucose: 247 mg/dl — ABNORMAL HIGH (ref 74–106)
Potassium: 3.8 mEq/L (ref 3.5–5.1)
Sodium: 136 mEq/L (ref 136–145)

## 2020-11-06 LAB — FIBRINOGEN
Fibrinogen: 323 mg/dl (ref 220–397)
Fibrinogen: 323 mg/dl (ref 220–397)

## 2020-11-06 LAB — LACTIC ACID
LACTIC ACID: 2 mmol/L (ref 0.5–2.2)
Lactic Acid: 2 mmol/L (ref 0.5–2.2)

## 2020-11-06 LAB — AMMONIA
Ammonia, plasma: 78 umol/L — ABNORMAL HIGH (ref 11.2–31.7)
Ammonia: 78 umol/L — ABNORMAL HIGH (ref 11.2–31.7)

## 2020-11-06 LAB — PROCALCITONIN
PROCALCITONIN: 0.09 ng/ml (ref 0.00–0.50)
PROCALCITONIN: 0.09 ng/ml (ref 0.00–0.50)

## 2020-11-06 LAB — MAGNESIUM
Magnesium: 1.6 mg/dL (ref 1.6–2.6)
Magnesium: 1.6 mg/dL (ref 1.6–2.6)

## 2020-11-06 LAB — FERRITIN
Ferritin: 237.2 ng/ml (ref 7.3–270.7)
Ferritin: 237.2 ng/ml (ref 7.3–270.7)

## 2020-11-06 LAB — LD: LD: 252 U/L — ABNORMAL HIGH (ref 120–246)

## 2020-11-06 LAB — BASIC METABOLIC PANEL
Anion Gap: 9 mmol/L (ref 5–15)
BUN: 7 mg/dl — ABNORMAL LOW (ref 9–23)
CO2: 21 mEq/L (ref 20–31)
Calcium: 7.9 mg/dl — ABNORMAL LOW (ref 8.7–10.4)
Chloride: 106 mEq/L (ref 98–107)
Creatinine: 0.58 mg/dl (ref 0.55–1.02)
EGFR IF NonAfrican American: >60
GFR African American: >60
Glucose: 247 mg/dl — ABNORMAL HIGH (ref 74–106)
Potassium: 3.8 mEq/L (ref 3.5–5.1)
Sodium: 136 mEq/L (ref 136–145)

## 2020-11-06 LAB — POCT GLUCOSE
POC Glucose: 189 mg/dL — ABNORMAL HIGH (ref 65–105)
POC Glucose: 223 mg/dL — ABNORMAL HIGH (ref 65–105)
POC Glucose: 226 mg/dL — ABNORMAL HIGH (ref 65–105)
POC Glucose: 279 mg/dL — ABNORMAL HIGH (ref 65–105)

## 2020-11-06 LAB — CBC WITH AUTO DIFFERENTIAL
Basophils %: 0.5 % (ref 0–3)
Eosinophils %: 0 % (ref 0–5)
Hematocrit: 32.7 % — ABNORMAL LOW (ref 37.0–50.0)
Hemoglobin: 11.2 gm/dl — ABNORMAL LOW (ref 13.0–17.2)
Immature Granulocytes: 1.2 % (ref 0.0–3.0)
Lymphocytes %: 7.4 % — ABNORMAL LOW (ref 28–48)
MCH: 34.4 pg (ref 25.4–34.6)
MCHC: 34.3 gm/dl (ref 30.0–36.0)
MCV: 100.3 fL — ABNORMAL HIGH (ref 80.0–98.0)
MPV: 11.3 fL — ABNORMAL HIGH (ref 6.0–10.0)
Monocytes %: 1.3 % (ref 1–13)
Neutrophils %: 89.6 % — ABNORMAL HIGH (ref 34–64)
Nucleated RBCs: 0 (ref 0–0)
Platelets: 335 10*3/uL (ref 140–450)
RBC: 3.26 M/uL — ABNORMAL LOW (ref 3.60–5.20)
RDW-SD: 50 — ABNORMAL HIGH (ref 36.4–46.3)
WBC: 9.3 10*3/uL (ref 4.0–11.0)

## 2020-11-06 LAB — D-DIMER, QUANTITATIVE: D-Dimer, Quant: 2.8 ug/mL (FEU) — ABNORMAL HIGH (ref 0.01–0.50)

## 2020-11-06 LAB — LACTATE DEHYDROGENASE: LD: 252 U/L — ABNORMAL HIGH (ref 120–246)

## 2020-11-06 LAB — C-REACTIVE PROTEIN: CRP: 77 mg/L — ABNORMAL HIGH (ref 0.00–9.90)

## 2020-11-06 MED FILL — SOLU-MEDROL (PF) 40 MG/ML SOLUTION FOR INJECTION: 40 mg/mL | INTRAMUSCULAR | Qty: 1

## 2020-11-06 MED FILL — LACTULOSE 20 GRAM/30 ML ORAL SOLUTION: 20 gram/30 mL | ORAL | Qty: 30

## 2020-11-06 MED FILL — LEVOTHYROXINE 50 MCG TAB: 50 mcg | ORAL | Qty: 1

## 2020-11-06 MED FILL — IPRATROPIUM-ALBUTEROL 2.5 MG-0.5 MG/3 ML NEB SOLUTION: 2.5 mg-0.5 mg/3 ml | RESPIRATORY_TRACT | Qty: 3

## 2020-11-06 MED FILL — METOPROLOL TARTRATE 25 MG TAB: 25 mg | ORAL | Qty: 1

## 2020-11-06 MED FILL — PIPERACILLIN-TAZOBACTAM 3.375 GRAM IV SOLR: 3.375 gram | INTRAVENOUS | Qty: 3.38

## 2020-11-06 MED FILL — XIFAXAN 550 MG TABLET: 550 mg | ORAL | Qty: 1

## 2020-11-06 MED FILL — VANCOMYCIN IN 0.9 % SODIUM CHLORIDE 1 GRAM/250 ML IV: 1 gram/250 mL | INTRAVENOUS | Qty: 250

## 2020-11-06 MED FILL — LOVENOX 300 MG/3 ML SUBCUTANEOUS SOLUTION: 300 mg/3 mL | SUBCUTANEOUS | Qty: 0.7

## 2020-11-06 MED FILL — HYDROXYCHLOROQUINE 200 MG TAB: 200 mg | ORAL | Qty: 1

## 2020-11-06 NOTE — Progress Notes (Signed)
Problem: Gas Exchange - Impaired  Goal: *Absence of hypoxia  Outcome: Progressing Towards Goal     Problem: Patient Education: Go to Patient Education Activity  Goal: Patient/Family Education  Outcome: Progressing Towards Goal

## 2020-11-06 NOTE — Progress Notes (Signed)
[facility] ([facility]   CARDIOLOGY PROGRESS NOTE  RECS:  AF                Rate controlled metoprolol tartrate 25 BID                Hx of SDH in 2019, not candidate for DOAC or VKA                Eating, no surgery planned                Nothing further to offer from Cardiology.                 WILL SIGN OFF, call if further questions       UTI                 Foley placed                 On rifaxim and Zosyn                 Started on solumedrol yesterday                  Active Hospital Problems    Diagnosis Date Noted    COVID-19 10/21/2020     Priority: 2 - Two     10/21/20 SARS CoV-2 detected      Atrial fibrillation (HCC) 10/31/2020     Noted this AM (10/31/20)  Rate controlled (84 BPM)  No previously documented hx of AFIB  CHADS2VASc = 6  (08/27/17 CT Acute interhemispheric fissure subdural hemorrhage. Chronic small vessel ischemic change)  ECHO:  EF 75%< LV 25/44, S/PW 9/10, LAVI 28, Mild MR, Mod TR, RVSP 43      Other partial intestinal obstruction (HCC) 10/31/2020     10/30/20 CT Small bowel obstruction secondary to presumed small bowel containing  umbilical hernia. Stranding within the hernia sac suggesting engorgement. No  pneumatosis.  11/01/20 SURG We will continue to follow the patient to ensure she does not develop worsening incarceration and bowel obstruction from the hernia.  Currently if we can delay the surgery she would likely benefit from decreased risk of postoperative complications      Metabolic encephalopathy 10/21/2020    Contusion of left hip 10/21/2020    Left elbow contusion 10/21/2020    Multiple falls 10/21/2020           ASSESSMENT:  [name] [name] is a 87 y.o. female  with AF  Principal Problem:    Atrial fibrillation (HCC) (10/31/2020)      Overview: ? Noted this AM (10/31/20)      ? Rate controlled (84 BPM)      ? No previously documented hx of AFIB      CHADS2VASc = 6  (08/27/17 CT Acute interhemispheric fissure subdural       hemorrhage. Chronic small vessel  ischemic change)      ? ECHO:  EF 75%< LV 25/44, S/PW 9/10, LAVI 28, Mild MR, Mod TR, RVSP 43    Active Problems:    COVID-19 (10/21/2020)      Overview: 10/21/20 SARS CoV-2 detected      Metabolic encephalopathy (10/21/2020)      Contusion of left hip (10/21/2020)      Left elbow contusion (10/21/2020)      Multiple falls (10/21/2020)      Other partial intestinal obstruction (HCC) (10/31/2020)      Overview: 10/30/20 CT Small bowel  obstruction secondary to presumed small bowel       containing      umbilical hernia. Stranding within the hernia sac suggesting engorgement.       No      pneumatosis.      11/01/20 SURG We will continue to follow the patient to ensure she does not       develop worsening incarceration and bowel obstruction from the hernia.        Currently if we can delay the surgery she would likely benefit from       decreased risk of postoperative complications        SUBJECTIVE:  No CP or SOB    VS: Visit Vitals  BP 127/85 (BP 1 Location: Right upper arm, BP Patient Position: Supine)   Pulse 70   Temp 97.9 ??F (36.6 ??C)   Resp 17   Ht 5\' 6"  (1.676 m)   Wt 70.1 kg (154 lb 8.7 oz)   SpO2 96%   Breastfeeding No   BMI 24.94 kg/m??     Last 3 Recorded Weights in this Encounter    10/31/20 0148 11/01/20 0755 11/04/20 0252   Weight: 74.2 kg (163 lb 9.3 oz) 74.2 kg (163 lb 9.3 oz) 70.1 kg (154 lb 8.7 oz)     Body mass index is 24.94 kg/m??.     Intake/Output Summary (Last 24 hours) at 11/06/2020 1125  Last data filed at 11/05/2020 1837  Gross per 24 hour   Intake 150 ml   Output 660 ml   Net -510 ml     TELE personally reviewed by me:  AF    ROS: Positives BOLDED  GEN: fever, chills sweats  CVS: Chest pain, palpitations, DOE, PND, orthopnea, claudication  RESP: epistaxis, SOB, wheezing, productive cough  GI: melena, hematochezia, hematemesis, N/V, diarrhea, constipation  GU: hematuria, dysuria    EXAM:  General:  Skin warm, perfusion adequate  Neck: JVD is absent, carotids without bruits  Lungs:focal wheezing LUL due to  mucous, no rales  Cardiac:  irregulary irregular Rhythm, N1/6 SE murmur, Gallops or Rubs  Abdomen: soft, nl bowel sounds, Liver 8cm  Ext: Edema is absent  Pulses: + 2 symmetrical  Neuro: Alert and oriented; Nonfocal defects    Labs:  Basic Metabolic Profile   Recent Labs     11/06/20  0409 11/05/20  1557 11/04/20  1927   NA 136 134* 137   K 3.8 4.2 3.2*   CL 106 105 107   CO2 21 17* 21   AGAP 9 12 9    GLU 247* 141* 161*   BUN 7* <5* 6*   CREA 0.58 0.61 0.58   GFRAA >60.0 >60.0 >60.0   GFRNA >60 >60 >60   CA 7.9* 7.9* 8.2*          CBC w/Diff    Recent Labs     11/06/20  0409 11/05/20  1557 11/04/20  1927   WBC 9.3 10.9 11.7*   RBC 3.26* 3.64 3.58*   HCT 32.7* 37.7 37.3   MCV 100.3* 103.6* 104.2*   MCH 34.4 33.8 34.1   MCHC 34.3 32.6 32.7    Recent Labs     11/06/20  0409 11/05/20  1557 11/04/20  1927   MONOS 1.3 5.8 7.7   EOS 0.0 1.5 1.6   BASOS 0.5 0.6 0.8      @  Lab Results   Component Value Date/Time    Cholesterol, total 113 09/06/2020 03:18  PM    HDL Cholesterol 48 09/06/2020 03:18 PM    LDL, calculated 50 09/06/2020 03:18 PM    Triglyceride 74 10/23/2020 12:11 AM    CHOL/HDL Ratio 2.4 09/06/2020 03:18 PM       Cardiac Enzymes   No results for input(s): CPK, CKMB in the last 72 hours.    No lab exists for component: CKMBINDX, TROPQUANT@troponins @     @  Lab Results   Component Value Date/Time    TSH 6.218 (H) 11/04/2020 07:27 PM    TSH 9.565 (H) 10/21/2020 12:52 PM    TSH 10.715 (H) 09/24/2020 03:10 PM    TSH 4.207 09/06/2020 03:18 PM    TSH 0.195 (L) 10/21/2018 12:48 PM    TSH 5.270 (H) 08/27/2017 01:13 PM       Coagulation   No results for input(s): INR, APTT, INREXT in the last 72 hours.    No lab exists for component: PT   TROPONIN  .RESLTROPHS:3    Medications:  Current Facility-Administered Medications   Medication Dose Route Frequency    lactulose (CHRONULAC) 10 gram/15 mL solution 30 mL  20 g Oral BID    levothyroxine (SYNTHROID) tablet 138 mcg  138 mcg Oral 6am    albuterol-ipratropium (DUO-NEB) 2.5  MG-0.5 MG/3 ML  3 mL Nebulization Q6H RT    piperacillin-tazobactam (ZOSYN) 3.375 g in 0.9% sodium chloride (MBP/ADV) 100 mL MBP  3.375 g IntraVENous Q8H    methylPREDNISolone (PF) (SOLU-MEDROL) injection 40 mg  40 mg IntraVENous Q8H    *vancomycin dosed by pharmacy  1 Each Other Rx Dosing/Monitoring    [START ON 11/07/2020] vancomycin trough draw on 8/25 at 1800   Other ONCE    metoprolol tartrate (LOPRESSOR) tablet 25 mg  25 mg Oral Q8H    enoxaparin (LOVENOX) injection 70 mg  1 mg/kg SubCUTAneous Q12H    [Held by provider] lisinopriL (PRINIVIL, ZESTRIL) tablet 40 mg  40 mg Oral DAILY    rifAXIMin (XIFAXAN) tablet 550 mg  550 mg Oral BID    [Held by provider] furosemide (LASIX) tablet 40 mg  40 mg Oral DAILY    [Held by provider] magnesium oxide (MAG-OX) tablet 400 mg  400 mg Oral DAILY    [Held by provider] melatonin tablet 3 mg  3 mg Oral QHS    [Held by provider] potassium chloride (K-DUR, KLOR-CON M20) SR tablet 40 mEq  40 mEq Oral DAILY    [Held by provider] calcium-vitamin D (OS-CAL +D3) 500 mg-200 unit per tablet 1 Tablet  1 Tablet Oral DAILY WITH LUNCH    hydrOXYchloroQUINE (PLAQUENIL) tablet 200 mg  200 mg Oral BID    [Held by provider] folic acid (FOLVITE) tablet 1 mg  1 mg Oral DAILY       Einar Gip, MD                        November 06, 2020                            11:25 AM    @Stephaie Dardis  , MD, [facility]  [facility], [facility]  Phone:  [phone](office)  Cell: [phone]  Pager: [phone]

## 2020-11-06 NOTE — Progress Notes (Signed)
SW received a consult to complete LTSS for snf placement.  Pt is confused and unable to participate in the screening.  SW attempted to reach the pt's son, Cristal Deer with no success. Pt left a vm message for Cristal Deer requesting a return call.      Please note that the LTSS was already completed by SW, [employer].

## 2020-11-06 NOTE — Progress Notes (Signed)
Problem: Dysphagia (Adult)  Goal: *Acute Goals and Plan of Care (Insert Text)  Description: Reassessment due: 11/12/20  Goals:  Patient will:  1. Tolerate recommended diet with </= 1 overt/ soft/ silent s/s aspiration or distress b/s for adequate po nutrition/ hydration  2. Utilize compensatory swallow strategies to improve swallow safety and decrease risks of aspiration/ penetration, independently  Outcome: Progressing Towards Goal

## 2020-11-06 NOTE — Progress Notes (Signed)
PHYSICAL THERAPY TREATMENT    Patient: [name] [name] (87 y.o. female)  Room: 2215/2215    Date: 11/06/2020  Start Time:  1515  End Time:  1540    Primary Diagnosis: Metabolic encephalopathy [G93.41]  Multiple falls [R29.6]  Contusion of left hip [S70.02XA]  Left elbow contusion [S50.02XA]         Precautions: falls, COVID (+)      Isolation:  Droplet Plus       MDRO: COVID-19     Orders reviewed, chart reviewed, and initial evaluation completed on [name] [name]. Cleared by RN.     ASSESSMENT :  Based on the objective data described below, the patient presents with        Pt require min assist in bed mobility.  Pt demo increase mentation.  Pt need min assist in sit to stand.  Demo poor walking balance  Pt able to walk 8 ft + 12 ft using RW with min assist  Pt is high risk of falling, limited mobility due to generalized weakness.  Recommend SNF upon dc for gait transfer, balance and strengthening exs.         Progression toward goals:  []           Improving appropriately and progressing toward goals  [x]           Improving slowly and progressing toward goals  []           Not making progress toward goals    Patient will benefit from skilled intervention to address the above impairments.  Patient's rehabilitation potential is considered to be Fair          PLAN :  Planned Interventions:  Functional mobility training Gait Training Therapeutic exercises Therapeutic activities AD training Patient/caregiver education    Frequency/Duration: Patient will be followed by physical therapy 3x / Week and 5x / Week to address goals.    Recommendations:  Physical Therapy and Occupational Therapy  Discharge Recommendations: SNF  Further Equipment Recommendations for Discharge: TBD        SUBJECTIVE:   Patient agreed to PT, reported that she is in [facility].    OBJECTIVE DATA SUMMARY:     Patient found: Bed, semirec    Pain Assessment before PT session: None observed and None reported  Pain Assessment after PT session:  none observed        COGNITIVE STATUS:     Mental Status: alert and oriented x 2  Communication: limited   Follows commands: impaired.  General Cognition: impaired sequencing, slow processing, delayed responses, and impaired safety.      Functional Mobility and Balance Status:   Supine to sit: min assist  Sit to supine: min assist  Sit to stand: min assist  Stand to sit: min assist    Gait:  Min assist using a Rw up to 8 ft., 12 ft  Therapeutic Exercises:      Lower Extremities:  Supine, Glut Set, Quad Set, Heel Slide, Straight leg raise, Hip ab/duction, Ankle pumps, and Cueing, 10reps, 2 sets        Activity Tolerance:   poor    Final Location:   bed, bed alarm, all needs close, and agrees to call for assistance    COMMUNICATION/EDUCATION:   Education: Patient, benefit of activity while hospitalized, importance of OOB  Barriers to Learning/Limitations: yes;  cognitive    Please refer to care plan and patient education section for further details.    Thank you for  this referral.  Dorise Hiss, PT

## 2020-11-06 NOTE — Progress Notes (Signed)
Problem: Gas Exchange - Impaired  Goal: Absence of hypoxia  Outcome: Progressing Towards Goal  Goal: Promote optimal lung function  Outcome: Progressing Towards Goal     Problem: Isolation Precautions - Risk of Spread of Infection  Goal: Prevent transmission of infectious organism to others  Outcome: Progressing Towards Goal     Problem: Nutrition Deficits  Goal: Optimize nutrtional status  Outcome: Progressing Towards Goal     Problem: Falls - Risk of  Goal: *Absence of Falls  Description: Document Schmid Fall Risk and appropriate interventions in the flowsheet.  Outcome: Progressing Towards Goal  Note: Fall Risk Interventions:  Mobility Interventions: Bed/chair exit alarm, Patient to call before getting OOB, OT consult for ADLs, PT Consult for mobility concerns, Assess mobility with egress test    Mentation Interventions: Bed/chair exit alarm, Increase mobility, More frequent rounding, Reorient patient, Eyeglasses and hearing aids, Evaluate medications/consider consulting pharmacy    Medication Interventions: Patient to call before getting OOB, Evaluate medications/consider consulting pharmacy, Bed/chair exit alarm    Elimination Interventions: Call light in reach, Bed/chair exit alarm, Patient to call for help with toileting needs    History of Falls Interventions: Bed/chair exit alarm, Consult care management for discharge planning, Vital signs minimum Q4HRs X 24 hrs (comment for end date)         Problem: Gas Exchange - Impaired  Goal: *Absence of hypoxia  Outcome: Progressing Towards Goal

## 2020-11-06 NOTE — Progress Notes (Signed)
Pt stable physically overnight. Answering mentation questions correctly and can converse coherently at times but at other times pt catches herself slipping into hallucinations and is very calm about sorting through her thoughts. Biggest complaint overnight was IV pumps beeping and boredom. Pt post void bladder scan 400 this morning without bladder distention or discomfort. Isolation precautions maintained for this COVID pt.     Lavell Luster, RN

## 2020-11-06 NOTE — Progress Notes (Signed)
Pt incontinent, voided a small amount. PVR = . Standing order per Dr Emmit Alexanders, place foley if x2 PVR >533ml. Pt straight cathed yesterday for >535ml PVR.     Foley placed, patent and draining.

## 2020-11-06 NOTE — Progress Notes (Signed)
 SPEECH LANGUAGE PATHOLOGY   BEDSIDE DYSPHAGIA EVALUATION     Patient: [name] [name] (87 y.o. female), 1933/05/21  Room: 2215/2215  Primary Diagnosis: Metabolic encephalopathy [G93.41]  Multiple falls [R29.6]  Contusion of left hip [S70.02XA]  Left elbow contusion [S50.02XA]        PMH:   Past Medical History:   Diagnosis Date    Arthritis     Deep venous thrombosis (HCC)     Endocrine disease     hypothyroid    Hemochromatosis     Hypertension        Isolation:  Droplet Plus       MDRO: COVID-19  PPE: droplet plus, N95, surgical mask, gloves, gown, face shield  Precautions:  droplet plus and aspiration    Date: 11/06/2020   Start Time:  1040 End Time:  1118     ASSESSMENT:   Orientation: Pt awake, alert, pleasant, oriented to person, disoriented to time and situation    Respiratory Status: O2 via NC, 2L  Evaluation: Overall, pt presents with functional oropharyngeal swallow skills in setting of new right lobe PNA developed since admit, multiple falls, metabolic encephalopathy, COVID+ 10/21/20  without s/sx aspiration following thin or regular solids.  Pt demo appropriate bite sizes, timely mastication, good bolus formation/ control, timely A-P transit with good oral transit, timely swallow response with good laryngeal elevation, single throat clear response following serial straw sips thin, no additional s/sx aspiration throughout remainder of session.  Given new right lobe PNA, pt would benefit from MBS to determine swallow function and safety of po feeds.  Orders written, study to be completed tomorrow.     Functional Oral Intake Scale: Level 7: Total oral intake without restrictions     PLAN/ RECOMMENDATIONS:                 Diet Recommendations: continue IDDSI 7- regular, IDDSI 0- thin, aspiration precautions   Compensatory Swallow Strategies: HOB ~60* for all po feeds and >45* at least 30 minutes following, slow rate of eating/ feeding   Medication Administration: as tolerated   Aspiration Precautions: may feed  self independently, intermittent supervision with meals  Risk(s) for Aspiration: medical condition, acute PNA     Therapy Recommendations: pt would benefit from continuation of SLP as per POC    Therapy Prognosis: good    Discharge Recommendations: to be determined  Other Recommended Services: OT, PT     SUBJECTIVE:    Patient Stated: You want to look at what?   Pain Prior to Treatment: no pain reported, no pain s/s observed, pt denied pain  Pain Following Treatment: no pain reported, no pain s/s observed, pt denied pain     Diet Prior to Admission: IDDSI 7- regular, IDDSI 0- thin  Current Diet: IDDSI 7- regular, IDDSI 0- thin     OBJECTIVE:   Position: HOB ~60*  Oral Motor Assessment: oromotor skills WFL  Dentition: natural   Voice:  WFL  Consistencies: thin single sips, serial sips, and via straw, regular  Oral Phase: WFL  Pharyngeal Phase: WFL and single throat clear response following serial straw sips thin  Esophageal Phase: wfl for b/s swallow eval    Safety: Following session, pt in bed, lowest position, HOB 60*, 4 side rails up, bed alarm on, tray table, phone, and call bell within reach, and educated pt to notify staff of any changes with swallowing/ speech     EDUCATION:   Education Provided: role of SLP, POC, results of  evaluation, diet recommendations, compensatory strategies for swallowing, aspiration defined, aspiration risk, aspiration precautions, aspiration pneumonia, and MBS rationale}  Individual Educated: patient  Comprehension: pt communicated comprehension, query pt's level carry over, query pt's level follow through, and no family present   Staff Education: Educated Nurse to POC, results of evaluation, diet recommendations, and MBS rationale.      IMAGING:   CXR Results  (Last 48 hours)                 11/06/20 0522  XR CHEST SNGL V Final result    Impression:  IMPRESSION: Portable AP upright view the chest exposed at 5:12 AM November 06, 2020 reveals persistent right upper lobe pneumonia  with patchy infiltrate in the   right lower lobe and associated small right pleural effusion that has developed   since October 24, 2020 similar to CT of November 04, 2020. The left lung is clear.   The heart is mildly enlarged with atherosclerotic changes in the aorta..           Narrative:  Indication: f/u right effusion/pna. .                 Results from Hospital Encounter encounter on 10/21/20    CT CHEST W CONT    Narrative  INDICATION: pulmonary nodules;  pulmonary nodules  COMPARISON: Chest radiograph dated 10/24/2020, CT abdomen dated 10/30/2020  TECHNIQUE: CT of the Chest with IV contrast. Sagittal and coronal  reconstructions. All CT exams at this facility use one or more dose reduction  techniques including automatic exposure control, mA/kV adjustment per patient's  size, or iterative reconstruction technique.      FINDINGS:  The central airways are patent.    There has been significant worsening of nodular densities and infiltrates in the  right lower lobe initially noted on abdomen CT dated 10/30/2020. Extensive  infiltrates as well as nodular and reticulonodular masslike opacity seen  throughout the right upper lobe, right middle lobe, and right lower lobe.  Relative sparing of the left lung. Moderate right pleural effusion with some  passive atelectasis. This has developed in the interval since 10/30/2020.    There is cardiomegaly. There are several mildly enlarged mediastinal and hilar  lymph nodes, which may be reactive. No axillary lymph node enlargement by CT  size criteria. Significant reflux of contrast material in the right axillary  region. This may be physiologic from power injector. Venous insufficiency not  excluded.    Generalized ectasia of the thoracic aorta with mild aneurysmal dilatation of the  ascending thoracic aorta measuring up to 4.8 cm. Ectasia of the aortic arch with  atherosclerotic calcification. Several areas of mild aneurysmal dilatation  involving the descending thoracic aorta  measuring up to 3.2 cm. Atherosclerosis  of the coronary arteries.    Multilevel degenerative changes in the spine. Chronic lumbar compression  deformities.    Impression  IMPRESSION:  1.  Extensive nodular and reticulonodular infiltrates/opacities involving the  right lung. Findings probably reflect infectious/inflammatory process.  Neoplastic process is not excluded.    2.  Above findings have progressed in the right lung base, compared  to abdomen  CT dated 10/30/2020.    3.  Moderate right pleural effusion, new since previous abdomen CT dated  10/30/2020.    4.  Given the above findings, recommend further evaluation or follow-up study to  document resolution..    5.  Mild mediastinal and hilar lymphadenopathy. This may be reactive.  6.  Cardiomegaly.    7.  Atherosclerosis with ectasia/aneurysmal dilatation of the thoracic aorta.    8.  Other findings as above.       Thank you for this referral,  Delon Rise, MS, CCC/SLP  Speech- Language Pathologist  [facility]  Office: [phone]

## 2020-11-07 ENCOUNTER — Inpatient Hospital Stay: Admit: 2020-11-07 | Payer: MEDICARE | Primary: Internal Medicine

## 2020-11-07 LAB — CBC WITH AUTOMATED DIFF
BASOPHILS: 0.4 % (ref 0–3)
EOSINOPHILS: 0 % (ref 0–5)
HCT: 35.2 % — ABNORMAL LOW (ref 37.0–50.0)
HGB: 11.9 gm/dl — ABNORMAL LOW (ref 13.0–17.2)
IMMATURE GRANULOCYTES: 1.1 % (ref 0.0–3.0)
LYMPHOCYTES: 5.2 % — ABNORMAL LOW (ref 28–48)
MCH: 34.3 pg (ref 25.4–34.6)
MCHC: 33.8 gm/dl (ref 30.0–36.0)
MCV: 101.4 fL — ABNORMAL HIGH (ref 80.0–98.0)
MONOCYTES: 3.9 % (ref 1–13)
MPV: 10.6 fL — ABNORMAL HIGH (ref 6.0–10.0)
NEUTROPHILS: 89.4 % — ABNORMAL HIGH (ref 34–64)
NRBC: 0 (ref 0–0)
PLATELET: 392 10*3/uL (ref 140–450)
RBC: 3.47 M/uL — ABNORMAL LOW (ref 3.60–5.20)
RDW-SD: 50.7 — ABNORMAL HIGH (ref 36.4–46.3)
WBC: 18.8 10*3/uL — ABNORMAL HIGH (ref 4.0–11.0)

## 2020-11-07 LAB — METABOLIC PANEL, BASIC
Anion gap: 10 mmol/L (ref 5–15)
BUN: 10 mg/dl (ref 9–23)
CO2: 25 mEq/L (ref 20–31)
Calcium: 8.8 mg/dl (ref 8.7–10.4)
Chloride: 104 mEq/L (ref 98–107)
Creatinine: 0.55 mg/dl (ref 0.55–1.02)
GFR est AA: >60
GFR est non-AA: >60
Glucose: 156 mg/dl — ABNORMAL HIGH (ref 74–106)
Potassium: 3.4 mEq/L — ABNORMAL LOW (ref 3.5–5.1)
Sodium: 139 mEq/L (ref 136–145)

## 2020-11-07 LAB — C REACTIVE PROTEIN, QT: C-Reactive protein: 50 mg/L — ABNORMAL HIGH (ref 0.00–9.90)

## 2020-11-07 LAB — GLUCOSE, POC
Glucose (POC): 184 mg/dL — ABNORMAL HIGH (ref 65–105)
Glucose (POC): 175 mg/dL — ABNORMAL HIGH (ref 65–105)
Glucose (POC): 159 mg/dL — ABNORMAL HIGH (ref 65–105)
Glucose (POC): 131 mg/dL — ABNORMAL HIGH (ref 65–105)

## 2020-11-07 LAB — D DIMER: D DIMER: 2.35 ug/mL (FEU) — ABNORMAL HIGH (ref 0.01–0.50)

## 2020-11-07 LAB — MAGNESIUM
Magnesium: 1.6 mg/dL (ref 1.6–2.6)
Magnesium: 1.6 mg/dL (ref 1.6–2.6)

## 2020-11-07 LAB — FERRITIN
Ferritin: 228 ng/ml (ref 7.3–270.7)
Ferritin: 228 ng/ml (ref 7.3–270.7)

## 2020-11-07 LAB — AMMONIA
Ammonia, plasma: 59 umol/L — ABNORMAL HIGH (ref 11.2–31.7)
Ammonia: 59 umol/L — ABNORMAL HIGH (ref 11.2–31.7)

## 2020-11-07 LAB — FIBRINOGEN
Fibrinogen: 453 mg/dl — ABNORMAL HIGH (ref 220–397)
Fibrinogen: 453 mg/dl — ABNORMAL HIGH (ref 220–397)

## 2020-11-07 LAB — C-REACTIVE PROTEIN: CRP: 50 mg/L — ABNORMAL HIGH (ref 0.00–9.90)

## 2020-11-07 LAB — BASIC METABOLIC PANEL
Anion Gap: 10 mmol/L (ref 5–15)
BUN: 10 mg/dl (ref 9–23)
CO2: 25 mEq/L (ref 20–31)
Calcium: 8.8 mg/dl (ref 8.7–10.4)
Chloride: 104 mEq/L (ref 98–107)
Creatinine: 0.55 mg/dl (ref 0.55–1.02)
EGFR IF NonAfrican American: >60
GFR African American: >60
Glucose: 156 mg/dl — ABNORMAL HIGH (ref 74–106)
Potassium: 3.4 mEq/L — ABNORMAL LOW (ref 3.5–5.1)
Sodium: 139 mEq/L (ref 136–145)

## 2020-11-07 LAB — CBC WITH AUTO DIFFERENTIAL
Basophils %: 0.4 % (ref 0–3)
Eosinophils %: 0 % (ref 0–5)
Hematocrit: 35.2 % — ABNORMAL LOW (ref 37.0–50.0)
Hemoglobin: 11.9 gm/dl — ABNORMAL LOW (ref 13.0–17.2)
Immature Granulocytes: 1.1 % (ref 0.0–3.0)
Lymphocytes %: 5.2 % — ABNORMAL LOW (ref 28–48)
MCH: 34.3 pg (ref 25.4–34.6)
MCHC: 33.8 gm/dl (ref 30.0–36.0)
MCV: 101.4 fL — ABNORMAL HIGH (ref 80.0–98.0)
MPV: 10.6 fL — ABNORMAL HIGH (ref 6.0–10.0)
Monocytes %: 3.9 % (ref 1–13)
Neutrophils %: 89.4 % — ABNORMAL HIGH (ref 34–64)
Nucleated RBCs: 0 (ref 0–0)
Platelets: 392 10*3/uL (ref 140–450)
RBC: 3.47 M/uL — ABNORMAL LOW (ref 3.60–5.20)
RDW-SD: 50.7 — ABNORMAL HIGH (ref 36.4–46.3)
WBC: 18.8 10*3/uL — ABNORMAL HIGH (ref 4.0–11.0)

## 2020-11-07 LAB — POCT GLUCOSE
POC Glucose: 184 mg/dL — ABNORMAL HIGH (ref 65–105)
POC Glucose: 175 mg/dL — ABNORMAL HIGH (ref 65–105)
POC Glucose: 159 mg/dL — ABNORMAL HIGH (ref 65–105)
POC Glucose: 131 mg/dL — ABNORMAL HIGH (ref 65–105)

## 2020-11-07 LAB — D-DIMER, QUANTITATIVE: D-Dimer, Quant: 2.35 ug/mL (FEU) — ABNORMAL HIGH (ref 0.01–0.50)

## 2020-11-07 MED ORDER — TAMSULOSIN SR 0.4 MG 24 HR CAP
0.4 mg | Freq: Every day | ORAL | Status: DC
Start: 2020-11-07 — End: 2020-11-13
  Administered 2020-11-07 – 2020-11-13 (×7): via ORAL

## 2020-11-07 MED ORDER — BARIUM SULFATE 40 % (W/V) ORAL POWDER
81 % (w/w) | Freq: Once | ORAL | Status: AC
Start: 2020-11-07 — End: 2020-11-07
  Administered 2020-11-07: 19:00:00 via ORAL

## 2020-11-07 MED ORDER — FUROSEMIDE 10 MG/ML IJ SOLN
10 mg/mL | Freq: Once | INTRAMUSCULAR | Status: AC
Start: 2020-11-07 — End: 2020-11-07
  Administered 2020-11-07: 15:00:00 via INTRAVENOUS

## 2020-11-07 MED ORDER — LEVOTHYROXINE 125 MCG TAB
125 mcg | ORAL | Status: DC
Start: 2020-11-07 — End: 2020-11-13
  Administered 2020-11-08 – 2020-11-13 (×6): via ORAL

## 2020-11-07 MED ORDER — BARIUM SULFATE 40 % (W/V), 30 % (W/W) ORAL SUSPENSION
40 % (w/v) | Freq: Once | ORAL | Status: AC
Start: 2020-11-07 — End: 2020-11-07
  Administered 2020-11-07: 19:00:00 via ORAL

## 2020-11-07 MED ORDER — METHYLPREDNISOLONE (PF) 40 MG/ML IJ SOLR
40 mg/mL | Freq: Three times a day (TID) | INTRAMUSCULAR | Status: AC
Start: 2020-11-07 — End: 2020-11-11
  Administered 2020-11-07 – 2020-11-12 (×15): via INTRAVENOUS

## 2020-11-07 MED ORDER — MAGNESIUM SULFATE 2 GRAM/50 ML IVPB
2 gram/50 mL (4 %) | Freq: Once | INTRAVENOUS | Status: AC
Start: 2020-11-07 — End: 2020-11-07
  Administered 2020-11-07: 15:00:00 via INTRAVENOUS

## 2020-11-07 MED ORDER — FUROSEMIDE 10 MG/ML IJ SOLN
10 mg/mL | Freq: Once | INTRAMUSCULAR | Status: AC
Start: 2020-11-07 — End: 2020-11-07
  Administered 2020-11-08: 01:00:00 via INTRAVENOUS

## 2020-11-07 MED ORDER — ENOXAPARIN 40 MG/0.4 ML SUB-Q SYRINGE
40 mg/0.4 mL | SUBCUTANEOUS | Status: DC
Start: 2020-11-07 — End: 2020-11-13
  Administered 2020-11-08 – 2020-11-13 (×6): via SUBCUTANEOUS

## 2020-11-07 MED ORDER — POTASSIUM CHLORIDE SR 20 MEQ TAB, PARTICLES/CRYSTALS
20 mEq | Freq: Once | ORAL | Status: AC
Start: 2020-11-07 — End: 2020-11-07
  Administered 2020-11-07: 15:00:00 via ORAL

## 2020-11-07 MED ORDER — IPRATROPIUM-ALBUTEROL 2.5 MG-0.5 MG/3 ML NEB SOLUTION
2.5 mg-0.5 mg/3 ml | Freq: Two times a day (BID) | RESPIRATORY_TRACT | Status: DC
Start: 2020-11-07 — End: 2020-11-11
  Administered 2020-11-07 – 2020-11-11 (×9): via RESPIRATORY_TRACT

## 2020-11-07 MED ORDER — BARIUM SULFATE 40 % (W/V), 30% (W/W) ORAL PASTE
40 % (w/v), 30% (w/w) | Freq: Once | ORAL | Status: AC
Start: 2020-11-07 — End: 2020-11-07
  Administered 2020-11-07: 19:00:00 via ORAL

## 2020-11-07 MED FILL — HYDROXYCHLOROQUINE 200 MG TAB: 200 mg | ORAL | Qty: 1

## 2020-11-07 MED FILL — XIFAXAN 550 MG TABLET: 550 mg | ORAL | Qty: 1

## 2020-11-07 MED FILL — MAGNESIUM SULFATE 2 GRAM/50 ML IVPB: 2 gram/50 mL (4 %) | INTRAVENOUS | Qty: 50

## 2020-11-07 MED FILL — SOLU-MEDROL (PF) 40 MG/ML SOLUTION FOR INJECTION: 40 mg/mL | INTRAMUSCULAR | Qty: 1

## 2020-11-07 MED FILL — TYLENOL 325 MG TABLET: 325 mg | ORAL | Qty: 2

## 2020-11-07 MED FILL — TAMSULOSIN SR 0.4 MG 24 HR CAP: 0.4 mg | ORAL | Qty: 1

## 2020-11-07 MED FILL — LACTULOSE 20 GRAM/30 ML ORAL SOLUTION: 20 gram/30 mL | ORAL | Qty: 30

## 2020-11-07 MED FILL — IPRATROPIUM-ALBUTEROL 2.5 MG-0.5 MG/3 ML NEB SOLUTION: 2.5 mg-0.5 mg/3 ml | RESPIRATORY_TRACT | Qty: 3

## 2020-11-07 MED FILL — VARIBAR THIN LIQUID 81 % (W/W) ORAL POWDER: 81 % (w/w) | ORAL | Qty: 30

## 2020-11-07 MED FILL — VARIBAR PUDDING 40 % (W/V), 30% (W/W) ORAL PASTE: 40 % (w/v), 30% (w/w) | ORAL | Qty: 15

## 2020-11-07 MED FILL — VARIBAR NECTAR 40 % (W/V), 30 % (W/W) ORAL SUSPENSION: 40 % (w/v) | ORAL | Qty: 15

## 2020-11-07 MED FILL — POTASSIUM CHLORIDE SR 20 MEQ TAB, PARTICLES/CRYSTALS: 20 mEq | ORAL | Qty: 2

## 2020-11-07 MED FILL — ONDANSETRON (PF) 4 MG/2 ML INJECTION: 4 mg/2 mL | INTRAMUSCULAR | Qty: 2

## 2020-11-07 MED FILL — PIPERACILLIN-TAZOBACTAM 3.375 GRAM IV SOLR: 3.375 gram | INTRAVENOUS | Qty: 3.38

## 2020-11-07 MED FILL — METOPROLOL TARTRATE 25 MG TAB: 25 mg | ORAL | Qty: 1

## 2020-11-07 MED FILL — LEVOTHYROXINE 50 MCG TAB: 50 mcg | ORAL | Qty: 1

## 2020-11-07 MED FILL — FUROSEMIDE 10 MG/ML IJ SOLN: 10 mg/mL | INTRAMUSCULAR | Qty: 2

## 2020-11-07 MED FILL — LOVENOX 300 MG/3 ML SUBCUTANEOUS SOLUTION: 300 mg/3 mL | SUBCUTANEOUS | Qty: 0.7

## 2020-11-07 NOTE — Progress Notes (Signed)
Progress Note             Daily Progress Note: 11/07/2020    Assessment/Plan:          Pt with mild confusion.   Is upset about her husband (deceased) getting a divorce.   Oriented to place, year, month.   Conversation proceeds normally.     Vomited last night.   Denies abdominal pain or nausea currently.   Periumbilical hernia not tender.     C/o mild cough.   Appreciate speech eval.   Increased rhonchi bilaterally.   O2 sats 97% on 2L/min NC; think she was placed on O2 after vomiting.   No chest pain.   BP has climbed last 12 hrs.     Glucose high on IV steroids.     Appreciate CVAL assessment/recs.       Afebrile.   O2 sats 967% on 2L/min.   AF with controlled vent rate.   Foley in place.   Facial flushing.    WBCs 18 (on steroids).   Ammonia 59       Sputum inadequate specimen.    D-dimer 3.62=>2.8=>2.35   (RA off MTX also present)  CRP 90=>77=> 50          Probable Covid-19 pneumonitis +/- inflammatory lung reaction, somewhat later presentation 12-14 days after initial + test.         -   Strep/Legionella ag neg.   MRSA swab neg.   Sputum Cx if able would be helpful.      -   Speech eval pending for completeness.   No symptoms of dysphagia.      -   Vanco DCd 8/24.   Continue Zosyn for now.        -   Decrease IV Solumedrol.   Continue nebs.       - s/p 2 vaccines and one booster.  Right pleural effusion.   Very small on CXR 8/24.   Clinically increased pulm edema.   Lasix 20 mg BID today.   CXR in am.    Umbilical hernia with recent PSBO.   Vomited last night, abd more distended today.   Decrease back to full liquids today.   KUB in am.                (S/p sigmoid resection and bladder repair 06/2003, prior TAH, then subsequent BSO).  4.   Acute urinary retention.   Foley replaced.   Add Flomax today (I forgot to write for yesterday)   Voiding trial again in a few days.  5.    New onset atrial fibrillation with CVR.   As per CVAL.           Echo 8/18:   EF 75%   est RVSP:4543mmHg       Pt not a candidate for long  term anticoagulation secondary to high fall risk.   Decrease Lovenox to 40 mg SQ every day for DVT prophylaxis, Covid-19.   Discussed risk/benefit with patient though not 100% sure she fully grasps.     Can DC telemetry.  6.   Hepatic encephalopathy, hallucinations.       Appreciate neurology's assessment.   Possible concurrent Lewy Body Dementia.  7.   Severe protein-calorie malnutrition.   8.   Cirrhosis of liver (Hemochromatosis, Methotrexate for several years for RA) with hepatic encephalopathy and mild auto-anticoagulation (INR 1.4 on admission).    Continue dual tx with Lactulose and Xifaxin for now.  9.   Multiple falls, 2 within 24 hrs of admission.    Hx multiple prior orthopedic surgeries.  10   Hypothyroidism, inadequate replacement.   Dose increased from 100 to 125 mcg this admission.  TSH improving.  11.    Hemochromatosis receiving periodic phlebotomies (~ 2/year) via Dr. Arnette Felts.    12.   RA, on Methotrexate approximately the last five years, DC'd about a month ago.  Continue Plaquenil.   Would have to wait a few weeks for a new DMARD with #1.   (Dr. Salem Caster)  13   Hx subdural hematoma 08/2017, not requiring surgery.  14.  HTN, elevated last 12hrs.   Assess response to Lasix.  15.   Hypokalemia, hypomagnesemia, replace.         Subjective:      Review of Systems:  ROS    Objective:   Physical Exam: Physical Exam  Cardiovascular:      Rate and Rhythm: Normal rate. Rhythm irregular.   Pulmonary:      Effort: Pulmonary effort is normal.      Breath sounds: Rhonchi and rales present. No wheezing.   Abdominal:      General: There is distension.      Tenderness: There is no abdominal tenderness. There is no guarding or rebound.      Hernia: A hernia is present.   Musculoskeletal:      Right lower leg: No edema.      Left lower leg: No edema.   Neurological:      Mental Status: She is alert.       Visit Vitals  BP (!) 174/99 (BP 1 Location: Right upper arm, BP Patient Position: Supine)   Pulse 70   Temp  97.5 ??F (36.4 ??C)   Resp 18   Ht 5\' 6"  (1.676 m)   Wt 75 kg (165 lb 5.5 oz)   SpO2 97%   Breastfeeding No   BMI 26.69 kg/m??    O2 Flow Rate (L/min): 2 l/min O2 Device: None (Room air)    Temp (24hrs), Avg:97.7 ??F (36.5 ??C), Min:97.3 ??F (36.3 ??C), Max:98 ??F (36.7 ??C)    08/25 0701 - 08/25 1900  In: -   Out: 600 [Urine:600]   08/23 1901 - 08/25 0700  In: 900 [P.O.:550; I.V.:350]  Out: 1075 [Urine:1075]      Data Review:       24 Hour Results:  Recent Results (from the past 24 hour(s))   GLUCOSE, POC    Collection Time: 11/06/20 11:41 AM   Result Value Ref Range    Glucose (POC) 226 (H) 65 - 105 mg/dL   GLUCOSE, POC    Collection Time: 11/06/20  5:01 PM   Result Value Ref Range    Glucose (POC) 279 (H) 65 - 105 mg/dL   CULTURE, RESPIRATORY/SPUTUM/BRONCH W GRAM STAIN    Collection Time: 11/06/20  9:15 PM    Specimen: Sputum; Respiratory specimen   Result Value Ref Range    GRAM STAIN (A)       >25 WBC's/lpf  >25 Epithelial cells/lpf  Mucus Present  Fungal elements seen      Culture Result Culture In Progress, Daily Updates To Follow     GLUCOSE, POC    Collection Time: 11/06/20  9:53 PM   Result Value Ref Range    Glucose (POC) 184 (H) 65 - 105 mg/dL   METABOLIC PANEL, BASIC    Collection Time: 11/07/20  5:36 AM   Result  Value Ref Range    Potassium 3.4 (L) 3.5 - 5.1 mEq/L    Chloride 104 98 - 107 mEq/L    Sodium 139 136 - 145 mEq/L    CO2 25 20 - 31 mEq/L    Glucose 156 (H) 74 - 106 mg/dl    BUN 10 9 - 23 mg/dl    Creatinine 1.61 0.96 - 1.02 mg/dl    GFR est AA >04.5      GFR est non-AA >60      Calcium 8.8 8.7 - 10.4 mg/dl    Anion gap 10 5 - 15 mmol/L   C REACTIVE PROTEIN, QT    Collection Time: 11/07/20  5:36 AM   Result Value Ref Range    C-Reactive protein 50.00 (H) 0.00 - 9.90 mg/L   FERRITIN    Collection Time: 11/07/20  5:36 AM   Result Value Ref Range    Ferritin 228.0 7.3 - 270.7 ng/ml   FIBRINOGEN    Collection Time: 11/07/20  5:36 AM   Result Value Ref Range    Fibrinogen 453 (H) 220 - 397 mg/dl   D DIMER     Collection Time: 11/07/20  5:36 AM   Result Value Ref Range    D DIMER 2.35 (H) 0.01 - 0.50 ug/mL (FEU)   CBC WITH AUTOMATED DIFF    Collection Time: 11/07/20  5:36 AM   Result Value Ref Range    WBC 18.8 (H) 4.0 - 11.0 1000/mm3    RBC 3.47 (L) 3.60 - 5.20 M/uL    HGB 11.9 (L) 13.0 - 17.2 gm/dl    HCT 40.9 (L) 81.1 - 50.0 %    MCV 101.4 (H) 80.0 - 98.0 fL    MCH 34.3 25.4 - 34.6 pg    MCHC 33.8 30.0 - 36.0 gm/dl    PLATELET 914 782 - 956 1000/mm3    MPV 10.6 (H) 6.0 - 10.0 fL    RDW-SD 50.7 (H) 36.4 - 46.3      NRBC 0 0 - 0      IMMATURE GRANULOCYTES 1.1 0.0 - 3.0 %    NEUTROPHILS 89.4 (H) 34 - 64 %    LYMPHOCYTES 5.2 (L) 28 - 48 %    MONOCYTES 3.9 1 - 13 %    EOSINOPHILS 0.0 0 - 5 %    BASOPHILS 0.4 0 - 3 %   MAGNESIUM    Collection Time: 11/07/20  5:36 AM   Result Value Ref Range    Magnesium 1.6 1.6 - 2.6 mg/dL   AMMONIA    Collection Time: 11/07/20  5:36 AM   Result Value Ref Range    Ammonia, plasma 59.0 (H) 11.2 - 31.7 umol/L   GLUCOSE, POC    Collection Time: 11/07/20  6:13 AM   Result Value Ref Range    Glucose (POC) 175 (H) 65 - 105 mg/dL               Problem List:  Problem List as of 11/07/2020 Date Reviewed: 2020/11/09            Codes Class Noted - Resolved    COVID-19 ICD-10-CM: U07.1  ICD-9-CM: 079.89  10/21/2020 - Present    Overview Signed 11/01/2020  1:27 PM by Einar Gip, MD     10/21/20 SARS CoV-2 detected             * (Principal) Atrial fibrillation (HCC) ICD-10-CM: I48.91  ICD-9-CM: 427.31  10/31/2020 - Present  Overview Addendum 11/06/2020 11:15 AM by Einar Gip, MD     Noted this AM (10/31/20)  Rate controlled (84 BPM)  No previously documented hx of AFIB  CHADS2VASc = 6  (08/27/17 CT Acute interhemispheric fissure subdural hemorrhage. Chronic small vessel ischemic change)  ECHO:  EF 75%< LV 25/44, S/PW 9/10, LAVI 28, Mild MR, Mod TR, RVSP 43             Other partial intestinal obstruction (HCC) ICD-10-CM: K56.690  ICD-9-CM: 560.89  10/31/2020 - Present    Overview Addendum 11/01/2020   1:28 PM by Einar Gip, MD     10/30/20 CT Small bowel obstruction secondary to presumed small bowel containing  umbilical hernia. Stranding within the hernia sac suggesting engorgement. No  pneumatosis.  11/01/20 SURG We will continue to follow the patient to ensure she does not develop worsening incarceration and bowel obstruction from the hernia.  Currently if we can delay the surgery she would likely benefit from decreased risk of postoperative complications             Metabolic encephalopathy ICD-10-CM: G93.41  ICD-9-CM: 348.31  10/21/2020 - Present        Contusion of left hip ICD-10-CM: S70.02XA  ICD-9-CM: 924.01  10/21/2020 - Present        Left elbow contusion ICD-10-CM: S50.02XA  ICD-9-CM: 923.11  10/21/2020 - Present        Multiple falls ICD-10-CM: R29.6  ICD-9-CM: V15.88  10/21/2020 - Present        Deep venous thrombosis (HCC) ICD-10-CM: I82.409  ICD-9-CM: 453.40  Unknown - Present        Subdural hematoma caused by concussion [facility]) ICD-10-CM: U98.1X9J  ICD-9-CM: 852.29  08/27/2017 - Present        Subdural hematoma (HCC) ICD-10-CM: Y78.2N5A  ICD-9-CM: 432.1  08/27/2017 - Present       Medications reviewed  Current Facility-Administered Medications   Medication Dose Route Frequency    albuterol-ipratropium (DUO-NEB) 2.5 MG-0.5 MG/3 ML  3 mL Nebulization BID RT    [START ON 11/08/2020] levothyroxine (SYNTHROID) tablet 125 mcg  125 mcg Oral 6am    magnesium sulfate 2 g/50 ml IVPB (premix or compounded)  2 g IntraVENous ONCE    potassium chloride (K-DUR, KLOR-CON M20) SR tablet 40 mEq  40 mEq Oral ONCE    furosemide (LASIX) injection 20 mg  20 mg IntraVENous ONCE    tamsulosin (FLOMAX) capsule 0.4 mg  0.4 mg Oral DAILY    [START ON 11/08/2020] enoxaparin (LOVENOX) injection 40 mg  40 mg SubCUTAneous Q24H    furosemide (LASIX) injection 20 mg  20 mg IntraVENous ONCE    lactulose (CHRONULAC) 10 gram/15 mL solution 30 mL  20 g Oral BID    piperacillin-tazobactam (ZOSYN) 3.375 g in 0.9% sodium chloride (MBP/ADV) 100  mL MBP  3.375 g IntraVENous Q8H    methylPREDNISolone (PF) (SOLU-MEDROL) injection 40 mg  40 mg IntraVENous Q8H    *vancomycin dosed by pharmacy  1 Each Other Rx Dosing/Monitoring    vancomycin trough draw on 8/25 at 1800   Other ONCE    guaiFENesin-dextromethorphan (ROBITUSSIN DM) 100-10 mg/5 mL syrup 10 mL  10 mL Oral Q6H PRN    metoprolol tartrate (LOPRESSOR) tablet 25 mg  25 mg Oral Q8H    [Held by provider] lisinopriL (PRINIVIL, ZESTRIL) tablet 40 mg  40 mg Oral DAILY    rifAXIMin (XIFAXAN) tablet 550 mg  550 mg Oral BID    [Held by provider]  furosemide (LASIX) tablet 40 mg  40 mg Oral DAILY    albuterol-ipratropium (DUO-NEB) 2.5 MG-0.5 MG/3 ML  3 mL Nebulization Q4H PRN    [Held by provider] magnesium oxide (MAG-OX) tablet 400 mg  400 mg Oral DAILY    [Held by provider] melatonin tablet 3 mg  3 mg Oral QHS    [Held by provider] potassium chloride (K-DUR, KLOR-CON M20) SR tablet 40 mEq  40 mEq Oral DAILY    [Held by provider] calcium-vitamin D (OS-CAL +D3) 500 mg-200 unit per tablet 1 Tablet  1 Tablet Oral DAILY WITH LUNCH    hydrOXYchloroQUINE (PLAQUENIL) tablet 200 mg  200 mg Oral BID    [Held by provider] folic acid (FOLVITE) tablet 1 mg  1 mg Oral DAILY    naloxone (NARCAN) injection 0.1 mg  0.1 mg IntraVENous PRN    acetaminophen (TYLENOL) tablet 650 mg  650 mg Oral Q4H PRN    Or    acetaminophen (TYLENOL) solution 650 mg  650 mg Oral Q4H PRN    Or    acetaminophen (TYLENOL) suppository 650 mg  650 mg Rectal Q4H PRN    [Held by provider] HYDROcodone-acetaminophen (NORCO) 5-325 mg per tablet 1 Tablet  1 Tablet Oral Q6H PRN    ondansetron (ZOFRAN) injection 4 mg  4 mg IntraVENous Q4H PRN       Care Plan discussed with: Patient/Family and Nurse    Total time spent with patient: 40 minutes.    [name], MD  November 07, 2020

## 2020-11-07 NOTE — Progress Notes (Signed)
Problem: Airway Clearance - Ineffective  Goal: Achieve or maintain patent airway  Outcome: Progressing Towards Goal     Problem: Gas Exchange - Impaired  Goal: Absence of hypoxia  Outcome: Progressing Towards Goal     Problem: Isolation Precautions - Risk of Spread of Infection  Goal: Prevent transmission of infectious organism to others  Outcome: Progressing Towards Goal     Problem: Nutrition Deficits  Goal: Optimize nutrtional status  Outcome: Progressing Towards Goal     Problem: Falls - Risk of  Goal: *Absence of Falls  Description: Document Schmid Fall Risk and appropriate interventions in the flowsheet.  Outcome: Progressing Towards Goal  Note: Fall Risk Interventions:  Mobility Interventions: OT consult for ADLs, Patient to call before getting OOB, PT Consult for mobility concerns, PT Consult for assist device competence, Bed/chair exit alarm    Mentation Interventions: Bed/chair exit alarm, Reorient patient, More frequent rounding, Room close to nurse's station, Evaluate medications/consider consulting pharmacy, Adequate sleep, hydration, pain control    Medication Interventions: Patient to call before getting OOB, Teach patient to arise slowly, Evaluate medications/consider consulting pharmacy, Bed/chair exit alarm    Elimination Interventions: Bed/chair exit alarm, Call light in reach (foley)    History of Falls Interventions: Bed/chair exit alarm, Evaluate medications/consider consulting pharmacy, Room close to nurse's station, Vital signs minimum Q4HRs X 24 hrs (comment for end date)         Problem: Gas Exchange - Impaired  Goal: *Absence of hypoxia  Outcome: Progressing Towards Goal

## 2020-11-07 NOTE — Progress Notes (Signed)
Chaplain Follow-Up    Start Visit: 1409  End Visit: 1411    Chaplain conducted a Follow up consultation and Spiritual Assessment for [name] [name], who is a 87 y.o.,female.      The Chaplain provided the following Interventions:  Continued the relationship of care and support.   Patient is COVID.  Offered prayer and assurance of continued prayer on patients behalf.   Chart reviewed.    The following outcomes were achieved:  Patient visit attempt by telephone for chaplain's visit.  No answer, remote prayer.    Assessment:  There are no further spiritual or religious issues which require Pastoral Care Services interventions at this time.     Plan:  Chaplains will continue to follow and will provide pastoral care on an as needed/requested basis.    Nona Dell.  Scientist, water quality Care   [phone]

## 2020-11-07 NOTE — Progress Notes (Signed)
 SPEECH LANGUAGE PATHOLOGY  MODIFIED BARIUM SWALLOW STUDY      Patient: [name] [name] (87 y.o. female) [birth year]  Room: 2215/2215  Primary Diagnosis: Metabolic encephalopathy [G93.41]  Multiple falls [R29.6]  Contusion of left hip [S70.02XA]  Left elbow contusion [S50.02XA]            Patient Active Problem List    Diagnosis Date Noted    COVID-19 10/21/2020    Atrial fibrillation (HCC) 10/31/2020    Other partial intestinal obstruction (HCC) 10/31/2020    Metabolic encephalopathy 10/21/2020    Contusion of left hip 10/21/2020    Left elbow contusion 10/21/2020    Multiple falls 10/21/2020    Subdural hematoma caused by concussion (HCC) 08/27/2017    Subdural hematoma (HCC) 08/27/2017    Deep venous thrombosis (HCC)        Isolation:  Droplet Plus       MDRO: COVID-19  PPE: droplet, droplet plus, airborne, surgical mask, gloves  Precautions:  universal, droplet, droplet plus, and airborne  PLOF: As per H&P     Date: 11/07/2020  Start Time:  1345 End Time:  1400       ASSESSMENT:    Orientation: Pt awake, confused     Respiratory Status: RA  MBS:  Pt presents with mild oral, mild- moderate pharyngeal dysphagia characterized by frank, audible aspiration of thin liquid via straw x1 during the swallow due to reduced epiglottic ROM in the presence of cervical osteophyte at C3/C4.  Flash penetration of thin liquid via straw also occurred.  Mild swallow delay present with thin and nectar thick barium.  NO penetration/aspiration with nectar or pudding barium.  Mod pharyngeal residuals of pudding thick barium in valleculae were mildly cleared with cued repeat swallow.  Chin tuck and chin elevation as strategies for improved laryngeal clearance were ineffective in clearing residuals. Cervical osteophytes also noted at C6/C7-defer to radiology report for further details.      Pt educated to results of MBS with video playback of study to basic oropharyngeal anatomy, basic swallow physiology, deficits related to swallow  function, diet recs with compensatory swallow strategies mentioned above.  Penetration- Aspiration Scale (PAS): 7: Material enters the airway, passes BELOW the vocal folds, and IS NOT ejected out of the trachea despite effort (audible aspiration)  Functional Oral Intake Scale: Level 4: Total oral diet of a single consistency     PLAN/ RECOMMENDATIONS:     Diet Recommendations: change current diet to IDDSI 2 mildly thick (nectar), full liquid, mildly thick (nectar)   Compensatory Strategies: HOB ~60* for all po feeds and >45* at least 30 minutes following, slow rate of eating/ feeding, repeat swallow   Medication Administration: crushed in puree    Aspiration Precautions: intermittent supervision with meals  Risk(s) for Aspiration: medical condition, mental ability/ status, current level of dysphagia     Therapy Recommendations: pt would benefit from continuation of SLP for dysphagia   Therapy Prognosis: fair    Discharge Recommendations: to be determined  Other Recommended Services: OT, PT     SUBJECTIVE/ OBJECTIVE:   Patient Stated: Thay's my son.   Pain Prior to Treatment: no pain s/s observed   Pain Following Treatment: no pain s/s observed    Procedure: The study was completed in lateral plane with the patient on stretcher in high fowler .    Consistency: Thin Ba single sips, serial sips, via cup, and via straw   Oral Phase:  delayed oral initiation, spillage to level of  valleculae prior to swallow response   Pharyngeal Phase: mild delayed initiation, posterior translocation epiglottic movement (epiglottis remains upright to contact posterior pharyngeal wall) without appropriate airway protection, reduced laryngeal closure, cervical osteophytes noted at c3/c4 and c6/c7, silent laryngeal penetration to upper laryngeal vestibule repeated during the swallow, audible, frank aspiration occurred during the swallow, moderate valleculae retention after swallow    Consistency:  Nectar Thick Ba single sips, serial sips,  and via straw   Oral Phase:  prolonged oral phase   Pharyngeal Phase: posterior translocation epiglottic movement (epiglottis remains upright to contact posterior pharyngeal wall) with appropriate airway protection, vallaculae residue     Consistency:  Pudding Ba via tsp    Oral Phase:  WFL   Pharyngeal Phase: mild valleculae retention after swallow     Esophageal Phase: cervical osteophytes noted at Cc6/c7 with impingement on posterior esophagus during swallow    Safety: Following session, pt in Hausted chair with safety belt secure, side rails up to be transported back to room.    EDUCATION:   Education Provided: POC and diet recommendations  Individual Educated: patient  Comprehension: query pt's level carry over  Staff Education: Educated MD to POC and diet recommendations.      Thank you,  Corean Abbot M.A., CCC-SLP  Speech-Language Pathologist   Secure Chat/office at [phone]

## 2020-11-08 ENCOUNTER — Inpatient Hospital Stay: Admit: 2020-11-08 | Payer: MEDICARE | Primary: Internal Medicine

## 2020-11-08 LAB — METABOLIC PANEL, COMPREHENSIVE
ALT (SGPT): 17 U/L (ref 10–49)
AST (SGOT): 24 U/L (ref 0.0–33.9)
Albumin: 2.4 gm/dl — ABNORMAL LOW (ref 3.4–5.0)
Alk. phosphatase: 101 U/L (ref 46–116)
Anion gap: 10 mmol/L (ref 5–15)
BUN: 12 mg/dl (ref 9–23)
Bilirubin, total: 0.9 mg/dl (ref 0.30–1.20)
CO2: 26 mEq/L (ref 20–31)
Calcium: 8.1 mg/dl — ABNORMAL LOW (ref 8.7–10.4)
Chloride: 107 mEq/L (ref 98–107)
Creatinine: 0.64 mg/dl (ref 0.55–1.02)
GFR est AA: >60
GFR est non-AA: >60
Glucose: 144 mg/dl — ABNORMAL HIGH (ref 74–106)
Potassium: 3.2 mEq/L — ABNORMAL LOW (ref 3.5–5.1)
Protein, total: 5.6 gm/dl — ABNORMAL LOW (ref 5.7–8.2)
Sodium: 143 mEq/L (ref 136–145)

## 2020-11-08 LAB — CBC WITH AUTOMATED DIFF
BASOPHILS: 0.2 % (ref 0–3)
EOSINOPHILS: 0 % (ref 0–5)
HCT: 32 % — ABNORMAL LOW (ref 37.0–50.0)
HGB: 10.8 gm/dl — ABNORMAL LOW (ref 13.0–17.2)
IMMATURE GRANULOCYTES: 0.7 % (ref 0.0–3.0)
LYMPHOCYTES: 5 % — ABNORMAL LOW (ref 28–48)
MCH: 34.1 pg (ref 25.4–34.6)
MCHC: 33.8 gm/dl (ref 30.0–36.0)
MCV: 100.9 fL — ABNORMAL HIGH (ref 80.0–98.0)
MONOCYTES: 3.7 % (ref 1–13)
MPV: 10.3 fL — ABNORMAL HIGH (ref 6.0–10.0)
NEUTROPHILS: 90.4 % — ABNORMAL HIGH (ref 34–64)
NRBC: 0 (ref 0–0)
PLATELET: 332 10*3/uL (ref 140–450)
RBC: 3.17 M/uL — ABNORMAL LOW (ref 3.60–5.20)
RDW-SD: 50.5 — ABNORMAL HIGH (ref 36.4–46.3)
WBC: 14.9 10*3/uL — ABNORMAL HIGH (ref 4.0–11.0)

## 2020-11-08 LAB — GLUCOSE, POC
Glucose (POC): 164 mg/dL — ABNORMAL HIGH (ref 65–105)
Glucose (POC): 130 mg/dL — ABNORMAL HIGH (ref 65–105)
Glucose (POC): 156 mg/dL — ABNORMAL HIGH (ref 65–105)
Glucose (POC): 165 mg/dL — ABNORMAL HIGH (ref 65–105)

## 2020-11-08 LAB — AMMONIA
Ammonia, plasma: 48 umol/L — ABNORMAL HIGH (ref 11.2–31.7)
Ammonia: 48 umol/L — ABNORMAL HIGH (ref 11.2–31.7)

## 2020-11-08 LAB — MAGNESIUM
Magnesium: 1.8 mg/dL (ref 1.6–2.6)
Magnesium: 1.8 mg/dL (ref 1.6–2.6)

## 2020-11-08 LAB — POCT GLUCOSE
POC Glucose: 164 mg/dL — ABNORMAL HIGH (ref 65–105)
POC Glucose: 130 mg/dL — ABNORMAL HIGH (ref 65–105)
POC Glucose: 156 mg/dL — ABNORMAL HIGH (ref 65–105)
POC Glucose: 165 mg/dL — ABNORMAL HIGH (ref 65–105)

## 2020-11-08 LAB — CBC WITH AUTO DIFFERENTIAL
Basophils %: 0.2 % (ref 0–3)
Eosinophils %: 0 % (ref 0–5)
Hematocrit: 32 % — ABNORMAL LOW (ref 37.0–50.0)
Hemoglobin: 10.8 gm/dl — ABNORMAL LOW (ref 13.0–17.2)
Immature Granulocytes: 0.7 % (ref 0.0–3.0)
Lymphocytes %: 5 % — ABNORMAL LOW (ref 28–48)
MCH: 34.1 pg (ref 25.4–34.6)
MCHC: 33.8 gm/dl (ref 30.0–36.0)
MCV: 100.9 fL — ABNORMAL HIGH (ref 80.0–98.0)
MPV: 10.3 fL — ABNORMAL HIGH (ref 6.0–10.0)
Monocytes %: 3.7 % (ref 1–13)
Neutrophils %: 90.4 % — ABNORMAL HIGH (ref 34–64)
Nucleated RBCs: 0 (ref 0–0)
Platelets: 332 10*3/uL (ref 140–450)
RBC: 3.17 M/uL — ABNORMAL LOW (ref 3.60–5.20)
RDW-SD: 50.5 — ABNORMAL HIGH (ref 36.4–46.3)
WBC: 14.9 10*3/uL — ABNORMAL HIGH (ref 4.0–11.0)

## 2020-11-08 LAB — COMPREHENSIVE METABOLIC PANEL
ALT: 17 U/L (ref 10–49)
AST: 24 U/L (ref 0.0–33.9)
Albumin: 2.4 gm/dl — ABNORMAL LOW (ref 3.4–5.0)
Alkaline Phosphatase: 101 U/L (ref 46–116)
Anion Gap: 10 mmol/L (ref 5–15)
BUN: 12 mg/dl (ref 9–23)
CO2: 26 mEq/L (ref 20–31)
Calcium: 8.1 mg/dl — ABNORMAL LOW (ref 8.7–10.4)
Chloride: 107 mEq/L (ref 98–107)
Creatinine: 0.64 mg/dl (ref 0.55–1.02)
EGFR IF NonAfrican American: >60
GFR African American: >60
Glucose: 144 mg/dl — ABNORMAL HIGH (ref 74–106)
Potassium: 3.2 mEq/L — ABNORMAL LOW (ref 3.5–5.1)
Sodium: 143 mEq/L (ref 136–145)
Total Bilirubin: 0.9 mg/dl (ref 0.30–1.20)
Total Protein: 5.6 gm/dl — ABNORMAL LOW (ref 5.7–8.2)

## 2020-11-08 MED ORDER — POTASSIUM CHLORIDE SR 10 MEQ TAB
10 mEq | Freq: Once | ORAL | Status: AC
Start: 2020-11-08 — End: 2020-11-08
  Administered 2020-11-09: 02:00:00 via ORAL

## 2020-11-08 MED ORDER — POTASSIUM CHLORIDE SR 20 MEQ TAB, PARTICLES/CRYSTALS
20 mEq | Freq: Two times a day (BID) | ORAL | Status: AC
Start: 2020-11-08 — End: 2020-11-08
  Administered 2020-11-08: 14:00:00 via ORAL

## 2020-11-08 MED ORDER — FUROSEMIDE 20 MG TAB
20 mg | Freq: Every day | ORAL | Status: DC
Start: 2020-11-08 — End: 2020-11-13
  Administered 2020-11-08 – 2020-11-13 (×6): via ORAL

## 2020-11-08 MED FILL — XIFAXAN 550 MG TABLET: 550 mg | ORAL | Qty: 1

## 2020-11-08 MED FILL — ONDANSETRON (PF) 4 MG/2 ML INJECTION: 4 mg/2 mL | INTRAMUSCULAR | Qty: 2

## 2020-11-08 MED FILL — DEXTROMETHORPHAN-GUAIFENESIN 10 MG-100 MG/5 ML SYRUP: 100-10 mg/5 mL | ORAL | Qty: 10

## 2020-11-08 MED FILL — LEVOTHYROXINE 125 MCG TAB: 125 mcg | ORAL | Qty: 1

## 2020-11-08 MED FILL — FUROSEMIDE 10 MG/ML IJ SOLN: 10 mg/mL | INTRAMUSCULAR | Qty: 2

## 2020-11-08 MED FILL — PIPERACILLIN-TAZOBACTAM 3.375 GRAM IV SOLR: 3.375 gram | INTRAVENOUS | Qty: 3.38

## 2020-11-08 MED FILL — HYDROXYCHLOROQUINE 200 MG TAB: 200 mg | ORAL | Qty: 1

## 2020-11-08 MED FILL — METOPROLOL TARTRATE 25 MG TAB: 25 mg | ORAL | Qty: 1

## 2020-11-08 MED FILL — LACTULOSE 20 GRAM/30 ML ORAL SOLUTION: 20 gram/30 mL | ORAL | Qty: 30

## 2020-11-08 MED FILL — SOLU-MEDROL (PF) 40 MG/ML SOLUTION FOR INJECTION: 40 mg/mL | INTRAMUSCULAR | Qty: 1

## 2020-11-08 MED FILL — FUROSEMIDE 20 MG TAB: 20 mg | ORAL | Qty: 1

## 2020-11-08 MED FILL — IPRATROPIUM-ALBUTEROL 2.5 MG-0.5 MG/3 ML NEB SOLUTION: 2.5 mg-0.5 mg/3 ml | RESPIRATORY_TRACT | Qty: 3

## 2020-11-08 MED FILL — POTASSIUM CHLORIDE SR 20 MEQ TAB, PARTICLES/CRYSTALS: 20 mEq | ORAL | Qty: 2

## 2020-11-08 MED FILL — TAMSULOSIN SR 0.4 MG 24 HR CAP: 0.4 mg | ORAL | Qty: 1

## 2020-11-08 MED FILL — ENOXAPARIN 40 MG/0.4 ML SUB-Q SYRINGE: 40 mg/0.4 mL | SUBCUTANEOUS | Qty: 0.4

## 2020-11-08 NOTE — Progress Notes (Signed)
Problem: Body Temperature -  Risk of, Imbalanced  Goal: Ability to maintain a body temperature within defined limits  Outcome: Progressing Towards Goal     Problem: Falls - Risk of  Goal: *Absence of Falls  Description: Document Bridgette Habermann Fall Risk and appropriate interventions in the flowsheet.  Outcome: Progressing Towards Goal  Note: Fall Risk Interventions:  Mobility Interventions: Patient to call before getting OOB    Mentation Interventions: Bed/chair exit alarm    Medication Interventions: Bed/chair exit alarm    Elimination Interventions: Bed/chair exit alarm    History of Falls Interventions: Bed/chair exit alarm

## 2020-11-08 NOTE — Progress Notes (Signed)
LTSS is still not signed by the pt's doctor. A secure chat message has been sent to Dr. Emmit Alexanders regarding signing the LTSS.

## 2020-11-08 NOTE — Progress Notes (Signed)
OCCUPATIONAL THERAPY TREATMENT     Patient: [name] [name] (87 y.o. female)  Room: 2215/2215    Primary Diagnosis: Metabolic encephalopathy [G93.41]  Multiple falls [R29.6]  Contusion of left hip [S70.02XA]  Left elbow contusion [S50.02XA]       Date of Admission: 10/21/2020   Length of Stay:  18 day(s)  Insurance: Payor: [facility] / Plan: [facility] MEDICARE A & B / Product Type: Medicare /      Date: 11/08/2020  In time:  13:15        Out time:  14:04    Isolation:  Droplet Plus       MDRO: COVID-19    Precautions: falls   Ordered weight bearing status: NA    ASSESSMENT:    Based on the objective data described below, the patient presents with       -  patient tolerated session well with no complications.   -  patient motivated to participate in OT to return to prior level of function in ADLs, IADLs and functional mobility.       PLAN: Continue OT POC    Recommendations:  Recommend continued skilled occupational therapy intervention to address above impairments.  Recommend out of bed activity to counteract ill effects of bedrest, with assistance from staff as needed.    Discharge Recommendations: skilled nursing facility (SNF), Would benefit to improve independence in ADLs, strength, activity tolerance and balance to ensure a successful and sustainable return to home. .  Equipment Recommendations for Discharge: no needs anticipated; has DME available at home     Education/ communication:     Barriers to learning/limitations:  None  Education provided to: patient on (+) role of OT, (+) OT plan of care, (+) instructed patient on the importance of activity while hospitalized to prevent a decline in function, (+) encouraged patient to sit up in chair for 45 (+) minutes or as tolerated 2-3 times a day, with staff assistance as needed, (+) the importance of maintaining UE muscle strength and activity tolerance while hospitalized to prevent a decline in function, (+) staff assistance with mobility, (+) change positions  frequently, (+) functional mobility, (+) ADL training, (+) safety  Educational handouts issued: none this session  Patient / family response to education: showing interest, trying to perform skills    SUBJECTIVE:     Patient agreed to OT session.    OBJECTIVE DATA SUMMARY:     Orders, labs, occupational therapy and chart reviewed on [facility]. Communicated with nursing staff. Patient cleared to participate in Occupational Therapy treatment.    Patient found: Bed, (+) bed/chair exit alarm    Most recent value for oxygen in flow sheets:  O2 Device: None (Room air) (11/08/20 1149)  O2 Flow Rate (L/min): 0 l/min (11/08/20 1022)     Pain assessment: none reported, none observed    Cognitive:   Mental status:   Orientation: Patient oriented to person, place, month, and year.  Communication: grossly intact  Attention Span:  good (>72min)  Follows commands: intact  Safety/Judgement: needs cueing for safety and precautions    Activities of Daily Living:  - Patient very drowsy at start of session, only agreeing to bed level activity, improved mood and alertness after patient washed face and hands with warm wash cloth and after completion of exercises. Patient later agreeable to OOB activity.   -  Patient completed washing face/hand with stand-by assistance.  -  Patient performed adjusting socks  with maximum assistance sitting  EOB, per patient report this is a task she struggled with before coming into the hospital.       Mobility:  -  Patient performed rolling with standby assistance  -  Patient completed supine to sit with minimum assistance  - Patient performed scooting at EOB with minimum assistance.   -  Patient performed sit to supine with moderate assistance  -  Patient completed sit to stand from edge of bed with moderate assistance, gait belt, RW.   -  Patient completed stand to sit with minimum assistance   - Patient completed moving up toward [facility] with Maximum assistance, cues for hands and legs placement, bed  briefly  in T burg position.     Activity Tolerance:   - patient is motivated to increase activity  - tolerance to sustained activity limited by fatigue  - functional activity tolerance limited by deconditioning    Balance:  -SBA/CGA sitting EOB  - Patient tolerated sitting EOB approximately 20 mins.     Therapeutic Exercises:   -  SCAPULA: bilateral, AROM exercises: 10 reps / 2 sets of elevation, depression, composite rotation, protraction/retraction   -  SHOULDER: bilateral, AROM exercises: 10 reps / 2 sets of flexion, rotation in front, and chest press / punches      Final Location: Patient positioned in bed, all needs within reach, agrees to call for assistance, (+) bed/chair exit alarm    Briscoe Deutscher, OTA  November 08, 2020

## 2020-11-08 NOTE — Progress Notes (Signed)
SPEECH LANGUAGE PATHOLOGY   BEDSIDE DYSPHAGIA TREATMENT      Patient: [name] [name] (87 y.o. female) 1933/12/13  Room: 2215/2215  Primary Diagnosis: Metabolic encephalopathy [G93.41]  Multiple falls [R29.6]  Contusion of left hip [S70.02XA]  Left elbow contusion [S50.02XA]          Isolation:  Droplet Plus       MDRO: COVID-19  PPE: N95, surgical mask, gloves, gown, goggles  Precautions:  droplet plus and aspiration     Date: 11/08/2020  Start Time:  8:55 End Time:  9:03    TREATMENT:    Orientation: Pt awake, agreeable to participate in tx   Respiratory Status: RA    Treatment: Swallow tx completed at b/s to assess tolerance/ safety of IDDSI 2 mildly thick (nectar) and therapeutic trials of IDDSI 0- thin for possible diet advancement.  MBS completed 8/25 indicated frank, audible aspiration of thin via straw x1 as well as an instance of flash penetration. No penetration or aspiration was observed with mildly thick (nectar) or pudding barium. No solids attempted on this date as pt is currently on full liquid diet (mildly thick) due to umbilical hernia with recent PSBO with new onset of vomiting and increasing abdominal distention 8/25. SLP facilitated upright positioning to increase safety with intake. Thin water with a straw present at bedside and was removed at conclusion of session. RN was made aware. Pt tolerated small sips of mildly thick liquids via cup with no overt or subtle s/s of penetration/aspiration. Small sips of thin water provided via cup. Pt with delayed swallow onset and 1-2 delayed repeat swallows noted following the swallow. Intermittent mild throat clear noted. Rec cont IDDSI 2-mildly thick liquids. Will address diet advancement once cleared by MD.          Functional Oral Intake Scale: Level 4: Total oral diet of a single consistency    PLAN/ RECOMMENDATIONS:                 Diet Recommendations: continue IDDSI 2 mildly thick (nectar)   Compensatory Swallow Strategies: HOB ~60* for all po feeds  and >45* at least 30 minutes following, slow rate of eating/ feeding, repeat swallow   Medication Administration: crushed in puree   Aspiration Precautions: intermittent supervision with meals  Risk(s) for Aspiration: medical condition, mental ability/ status, current level of dysphagia     Therapy Recommendations: pt would benefit from continuation of SLP as per POC    Therapy Prognosis: fair    Discharge Recommendations: to be determined  Other Recommended Services: OT, PT, Dietitian     SUBJECTIVE:    Patient Stated: "I have things going on that I can't even pronounce."   Pain Prior to Treatment: no pain reported, no pain s/s observed   Pain Following Treatment: no pain reported, no pain s/s observed     OBJECTIVE:    Position: upright in bed  Consistencies: thin and mildly thick (nectar) single sips and via cup,  all solids on hold per MD    Oral Phase: impaired retrieval  Pharyngeal Phase: delayed initiation, multiple swallows per presentation, and throat clear  Exercises:  n/a    Safety: Following session, pt tray table, phone, and call bell within reach and educated pt to notify staff of any changes with swallowing/ speech   Modified Rankin Score (MRS): Defer to OT/ PT    EDUCATION:   Education Provided: POC, diet recommendations, and compensatory strategies for swallowing  Individual Educated: patient  Comprehension:  pt communicated comprehension and query pt's level carry over  Staff Education: Educated Nurse to POC and diet recommendations.       Thank you,    Franchot Mimes, M.S. CCC-SLP  Speech Language Pathologist  Please Contact Ext. [phone] or Secure Chat

## 2020-11-08 NOTE — Progress Notes (Signed)
 NUTRITION RECOMMENDATIONS:   Recommend advance diet as medically able. Diet consistency per SLP.   Updated oral nutrition supplement. Recommend Magic Cup BID (provides 290 kcals, 9 g protein each) and Ensure Pudding BID (provides 170 kcals, 4 g protein each).  Pt continues with poor PO intake throughout admit x 18 days throughout admit. Would recommend considering NGT and supplemental TF if within GOC.   Daily wts to trend.   Discharge recommendations: 2 g Na    NUTRITION FOLLOW UP    Current Diet Order: ADULT ORAL NUTRITION SUPPLEMENT Breakfast, Dinner; Standard High Calorie/High Protein  ADULT ORAL NUTRITION SUPPLEMENT Lunch; Frozen Supplement  ADULT DIET Full Liquid; Mildly Thick (Nectar)    Current Intake: []  N/A- NPO    [x]  very poor     []  poor      []  fair      []  good  Pt on cardiac diet 8/8-8/14. Clear Liquid diet 8/15-8/17. Full Liquid 8/18. Advanced to easy to chew diet 8/19. Last meal documented on 8/25 of 0%. Pt's diet changed to full liquid diet, mildly thick liquids 8/25 due to N/V per RN. Discussed with RN, report pt did not eat at all yesterday for dinner, pt had some applesauce with medications today. RN will continue to encourage PO intake, specifically oral nutrition supplements.     Pertinent Medications: lovenox, lasix, lactulose, synthroid (FDI), solu-medrol, lopressor, zofran (prn), zosyn     Pertinent Labs: 8/26 K 3.2 L (rec replete)    Weight: Initial admit wt of 76.2 kg (8/8). Lowest BW this admit of 70.1 kg (8/22). Pt currently -3.8 L per I/O since 8/12. Will continue to monitor weight trends.   Last 3 Recorded Weights in this Encounter    11/04/20 0252 11/07/20 0143 11/08/20 0726   Weight: 70.1 kg (154 lb 8.7 oz) 75 kg (165 lb 5.5 oz) 75.9 kg (167 lb 5.3 oz)       BMI: Body mass index is 27.01 kg/m.    Estimated Daily Nutrition Needs: 70.1 kg (lowest wt)  1542 - 1893 kcals (22-27 kcal/kg)  84 - 105 g protein (1.2-1.5 g/kg)  1753 mL fluid (25 ml/kg or per MD)    Physical  Assessment:  GI Symptoms:   no clinical signs of a small bowel obstruction related to the umbilical hernia.  Patient had the bowel movement and the flatus earlier today per Surgery 8/22  +cirrhosis  Last Bowel Movement Date: 11/07/20  Stool Appearance: Loose  Abdominal Assessment: Intact  Bowel Sounds: Active   Chewing/Swallowing Issues:   Per SLP 8/25, Diet Recommendations: change current diet to IDDSI 2 mildly thick (nectar), full liquid, mildly thick (nectar)   Skin Integrity:   Stage 2 PI to buttocks per flowsheets  Fluid Accumulation:   Edema  LLE: 1+  RLE: 1+    Physical Assessment:   Muscle Wasting:  Pt not seen at this time in attempt to reduce unecessary exposure, pt COVID + Fat Wasting:  Pt not seen at this time in attempt to reduce unecessary exposure, pt COVID +     Assessment of Current MNT: Diet is appropriate but intake is inadequate to meet nutritional needs.    Nutrition Diagnosis:   Severe protein-calorie malnutrition in the context of acute illness/injury related to inability to consume sufficient energy 2/2 poor appetite as evidenced by <50% of estimated needs x >5 days, >2% weight loss x 1 week, >5% weight loss x 1 month. (Continues, wt fluctuating (may be related to fluid shifts))  Increased nutrient needs (kcal, protein) related to metabolic demand for catabolic illness / wound healing  as evidenced by COVID-19, cirrhosis, stage 2 PI to buttocks. (continues)    Nutrition Recommendation:   Recommend advance diet as medically able. Diet consistency per SLP.   Updated oral nutrition supplement. Recommend Magic Cup BID (provides 290 kcals, 9 g protein each) and Ensure Pudding BID (provides 170 kcals, 4 g protein each).  Pt continues with poor PO intake throughout admit x 18 days throughout admit. Would recommend considering NGT and supplemental TF if within GOC.   Daily wts to trend.   Discharge recommendations: 2 g Na    Monitoring and Evaluation: Wt trend, PO intake, nutrition-related labs,  and s/sx of new skin concerns; will f/u per policy.    Nutrition Goals: Pt to meet/tolerate >75% of estimated needs, maintain weight throughout LOS, BG control <180, improvement skin integrity.    Nutrition Level of Care: []  Low     []  Moderate     [x]  High    Progress Towards Nutrition Goals: []   Met/Ongoing     []   Progressing Appropriately     []   Progressing Slowly     [x]   Not Progressing    Code Status: Full Code       Sid Cork, MS, RD  Pager: [phone]  Office: [phone]

## 2020-11-08 NOTE — Progress Notes (Signed)
Progress Note             Daily Progress Note: 11/08/2020    Assessment/Plan:          Pt persists with mild confusion and intermittent hallucinations/delusions.   Oriented to place, year, month, not day or date.     No vomiting since 2 nights ago.   Denies sob/chest pain/nausea.   Poor appetite.     MBS + for aspiration of thin liquids.          Afebrile.   O2 sat 100% on RA.   AF with CVR.   Lungs with improved rhonchi with diuresis yesterday.    CXR: RUL infiltrate, improved R lower lung infiltrates.  KUB:   No obstruction.    Ammonia 48.   (Diarrhea with higher dose Lactulose).        Probable Covid-19 pneumonitis +/- inflammatory lung reaction, somewhat later presentation 12-14 days after initial + test.         -   Strep/Legionella ag neg.   MRSA swab neg.   Sputum Cx if able would be helpful.      -   Possible aspiration PNA.   Continue Zosyn.      -   Vanco DCd 8/24.           -   Continue IV Solumedrol, nebs.       - s/p 2 vaccines and one booster.     -   Pt is on 20 day quarantine.    Right pleural effusion.   Very small on CXR 8/24.     Acute diastolic CHF sec AF.   Improved.   Resume scheduled po Lasix.    Umbilical hernia with recent PSBO.   Trial advance diet today.       - tentative plan is for outpatient surgical repair of hernia once acute conditions resolve.                (S/p sigmoid resection and bladder repair 06/2003, prior TAH, then subsequent BSO).  5.   Acute urinary retention.   Continue foley today.  Flomax added 11/07/20.   Voiding trial again in a few days.  6.    New onset atrial fibrillation with CVR.   As per CVAL.           Echo 8/18:   EF 75%   est RVSP:7mHg       Pt not a candidate for long term anticoagulation secondary to high fall risk.   Decrease Lovenox to 40 mg SQ every day for DVT prophylaxis, Covid-19.   Discussed risk/benefit with patient though not 100% sure she fully grasps.  7.   Hepatic encephalopathy, hallucinations.       Appreciate neurology's assessment.    Possible concurrent Lewy Body Dementia.  8.   Severe protein-calorie malnutrition.   9.   Cirrhosis of liver (Hemochromatosis, Methotrexate for several years for RA) with hepatic encephalopathy and mild auto-anticoagulation (INR 1.4 on admission).    Continue dual tx with Lactulose and Xifaxin for now.   No additional WU at this time per GI.  10   Multiple falls, 2 within 24 hrs of admission.    Hx multiple prior orthopedic surgeries.  11.  Hypothyroidism, inadequate replacement.   Dose increased from 100 to 125 mcg this admission.  TSH improving.  12    Hemochromatosis receiving periodic phlebotomies (~ 2/year) via Dr. FKeturah Barre    13.   RA, on Methotrexate  approximately the last five years, DC'd about a month ago.  Continue Plaquenil.   Would have to wait a few weeks for a new DMARD with #1.   (Dr. Kalman Jewels)  14.   Hx subdural hematoma 08/2017, not requiring surgery.  15.  HTN.  16.   Hypokalemia, hypomagnesemia, replace.    Repeat Covid lbs am.   Pt also with RA with mild flair.         Subjective:      Review of Systems:  ROS    Objective:   Physical Exam: Physical Exam  Cardiovascular:      Rate and Rhythm: Normal rate. Rhythm irregular.   Pulmonary:      Effort: Pulmonary effort is normal.      Breath sounds: Rhonchi and rales present. No wheezing.   Abdominal:      General: Bowel sounds are normal.      Tenderness: There is no abdominal tenderness. There is no guarding or rebound.      Hernia: A hernia is present.   Musculoskeletal:      Right lower leg: No edema.      Left lower leg: No edema.   Neurological:      Mental Status: She is alert.       Visit Vitals  BP (!) 145/87 (BP 1 Location: Left upper arm, BP Patient Position: Supine)   Pulse (!) 57   Temp 97.9 ??F (36.6 ??C)   Resp 15   Ht '5\' 6"'  (1.676 m)   Wt 75.9 kg (167 lb 5.3 oz)   SpO2 100%   Breastfeeding No   BMI 27.01 kg/m??    O2 Flow Rate (L/min): 2 l/min O2 Device: None (Room air)    Temp (24hrs), Avg:97.8 ??F (36.6 ??C), Min:97 ??F (36.1 ??C), Max:98.1 ??F  (36.7 ??C)    08/26 0701 - 08/26 1900  In: 50 [P.O.:50]  Out: 1250 [Urine:1250]   08/24 1901 - 08/26 0700  In: 202 [P.O.:118; I.V.:450]  Out: 3675 [Urine:3675]      Data Review:       24 Hour Results:  Recent Results (from the past 24 hour(s))   GLUCOSE, POC    Collection Time: 11/07/20 12:15 PM   Result Value Ref Range    Glucose (POC) 159 (H) 65 - 105 mg/dL   GLUCOSE, POC    Collection Time: 11/07/20  5:09 PM   Result Value Ref Range    Glucose (POC) 131 (H) 65 - 105 mg/dL   GLUCOSE, POC    Collection Time: 11/07/20  9:58 PM   Result Value Ref Range    Glucose (POC) 164 (H) 65 - 105 mg/dL   CBC WITH AUTOMATED DIFF    Collection Time: 11/08/20  2:22 AM   Result Value Ref Range    WBC 14.9 (H) 4.0 - 11.0 1000/mm3    RBC 3.17 (L) 3.60 - 5.20 M/uL    HGB 10.8 (L) 13.0 - 17.2 gm/dl    HCT 32.0 (L) 37.0 - 50.0 %    MCV 100.9 (H) 80.0 - 98.0 fL    MCH 34.1 25.4 - 34.6 pg    MCHC 33.8 30.0 - 36.0 gm/dl    PLATELET 332 140 - 450 1000/mm3    MPV 10.3 (H) 6.0 - 10.0 fL    RDW-SD 50.5 (H) 36.4 - 46.3      NRBC 0 0 - 0      IMMATURE GRANULOCYTES 0.7 0.0 - 3.0 %  NEUTROPHILS 90.4 (H) 34 - 64 %    LYMPHOCYTES 5.0 (L) 28 - 48 %    MONOCYTES 3.7 1 - 13 %    EOSINOPHILS 0.0 0 - 5 %    BASOPHILS 0.2 0 - 3 %   METABOLIC PANEL, COMPREHENSIVE    Collection Time: 11/08/20  2:22 AM   Result Value Ref Range    Potassium 3.2 (L) 3.5 - 5.1 mEq/L    Chloride 107 98 - 107 mEq/L    Sodium 143 136 - 145 mEq/L    CO2 26 20 - 31 mEq/L    Glucose 144 (H) 74 - 106 mg/dl    BUN 12 9 - 23 mg/dl    Creatinine 0.64 0.55 - 1.02 mg/dl    GFR est AA >60.0      GFR est non-AA >60      Calcium 8.1 (L) 8.7 - 10.4 mg/dl    Anion gap 10 5 - 15 mmol/L    AST (SGOT) 24.0 0.0 - 33.9 U/L    ALT (SGPT) 17 10 - 49 U/L    Alk. phosphatase 101 46 - 116 U/L    Bilirubin, total 0.90 0.30 - 1.20 mg/dl    Protein, total 5.6 (L) 5.7 - 8.2 gm/dl    Albumin 2.4 (L) 3.4 - 5.0 gm/dl   AMMONIA    Collection Time: 11/08/20  2:22 AM   Result Value Ref Range    Ammonia, plasma  48.0 (H) 11.2 - 31.7 umol/L   MAGNESIUM    Collection Time: 11/08/20  2:22 AM   Result Value Ref Range    Magnesium 1.8 1.6 - 2.6 mg/dL   GLUCOSE, POC    Collection Time: 11/08/20  6:13 AM   Result Value Ref Range    Glucose (POC) 130 (H) 65 - 105 mg/dL               Problem List:  Problem List as of 11/08/2020 Date Reviewed: 12-01-20            Codes Class Noted - Resolved    HQION-62 ICD-10-CM: U07.1  ICD-9-CM: 079.89  10/21/2020 - Present    Overview Signed 11/01/2020  1:27 PM by Lillia Dallas, MD     10/21/20 SARS CoV-2 detected             * (Principal) Atrial fibrillation ([place]) ICD-10-CM: I48.91  ICD-9-CM: 427.31  10/31/2020 - Present    Overview Addendum 11/06/2020 11:15 AM by Lillia Dallas, MD     Noted this AM (10/31/20)  Rate controlled (84 BPM)  No previously documented hx of AFIB  CHADS2VASc = 6  (08/27/17 CT Acute interhemispheric fissure subdural hemorrhage. Chronic small vessel ischemic change)  ECHO:  EF 75%< LV 25/44, S/PW 9/10, LAVI 28, Mild MR, Mod TR, RVSP 43             Other partial intestinal obstruction ([place]) ICD-10-CM: K56.690  ICD-9-CM: 560.89  10/31/2020 - Present    Overview Addendum 11/01/2020  1:28 PM by Lillia Dallas, MD     10/30/20 CT Small bowel obstruction secondary to presumed small bowel containing  umbilical hernia. Stranding within the hernia sac suggesting engorgement. No  pneumatosis.  11/01/20 SURG We will continue to follow the patient to ensure she does not develop worsening incarceration and bowel obstruction from the hernia.  Currently if we can delay the surgery she would likely benefit from decreased risk of postoperative complications  Metabolic encephalopathy ZOX-09-UE: G93.41  ICD-9-CM: 348.31  10/21/2020 - Present        Contusion of left hip ICD-10-CM: S70.02XA  ICD-9-CM: 924.01  10/21/2020 - Present        Left elbow contusion ICD-10-CM: S50.02XA  ICD-9-CM: 923.11  10/21/2020 - Present        Multiple falls ICD-10-CM: R29.6  ICD-9-CM: V15.88  10/21/2020 - Present         Deep venous thrombosis (H[facility]) ICD-10-CM: I82.409  ICD-9-CM: 453.40  Unknown - Present        Subdural hematoma caused by concussion [facility]) ICD-10-CM: A54.0J8J  ICD-9-CM: 852.29  08/27/2017 - Present        Subdural hematoma (HCC) ICD-10-CM: X91.4N8G  ICD-9-CM: 432.1  08/27/2017 - Present       Medications reviewed  Current Facility-Administered Medications   Medication Dose Route Frequency    potassium chloride (K-DUR, KLOR-CON M20) SR tablet 40 mEq  40 mEq Oral BID    albuterol-ipratropium (DUO-NEB) 2.5 MG-0.5 MG/3 ML  3 mL Nebulization BID RT    levothyroxine (SYNTHROID) tablet 125 mcg  125 mcg Oral 6am    tamsulosin (FLOMAX) capsule 0.4 mg  0.4 mg Oral DAILY    enoxaparin (LOVENOX) injection 40 mg  40 mg SubCUTAneous Q24H    methylPREDNISolone (PF) (SOLU-MEDROL) injection 20 mg  20 mg IntraVENous Q8H    lactulose (CHRONULAC) 10 gram/15 mL solution 30 mL  20 g Oral BID    piperacillin-tazobactam (ZOSYN) 3.375 g in 0.9% sodium chloride (MBP/ADV) 100 mL MBP  3.375 g IntraVENous Q8H    guaiFENesin-dextromethorphan (ROBITUSSIN DM) 100-10 mg/5 mL syrup 10 mL  10 mL Oral Q6H PRN    metoprolol tartrate (LOPRESSOR) tablet 25 mg  25 mg Oral Q8H    [Held by provider] lisinopriL (PRINIVIL, ZESTRIL) tablet 40 mg  40 mg Oral DAILY    rifAXIMin (XIFAXAN) tablet 550 mg  550 mg Oral BID    [Held by provider] furosemide (LASIX) tablet 40 mg  40 mg Oral DAILY    albuterol-ipratropium (DUO-NEB) 2.5 MG-0.5 MG/3 ML  3 mL Nebulization Q4H PRN    [Held by provider] magnesium oxide (MAG-OX) tablet 400 mg  400 mg Oral DAILY    [Held by provider] melatonin tablet 3 mg  3 mg Oral QHS    [Held by provider] potassium chloride (K-DUR, KLOR-CON M20) SR tablet 40 mEq  40 mEq Oral DAILY    [Held by provider] calcium-vitamin D (OS-CAL +D3) 500 mg-200 unit per tablet 1 Tablet  1 Tablet Oral DAILY WITH LUNCH    hydrOXYchloroQUINE (PLAQUENIL) tablet 200 mg  200 mg Oral BID    [Held by provider] folic acid (FOLVITE) tablet 1 mg  1 mg Oral DAILY     naloxone (NARCAN) injection 0.1 mg  0.1 mg IntraVENous PRN    acetaminophen (TYLENOL) tablet 650 mg  650 mg Oral Q4H PRN    Or    acetaminophen (TYLENOL) solution 650 mg  650 mg Oral Q4H PRN    Or    acetaminophen (TYLENOL) suppository 650 mg  650 mg Rectal Q4H PRN    [Held by provider] HYDROcodone-acetaminophen (NORCO) 5-325 mg per tablet 1 Tablet  1 Tablet Oral Q6H PRN    ondansetron (ZOFRAN) injection 4 mg  4 mg IntraVENous Q4H PRN       Care Plan discussed with: Patient/Family and Nurse    Total time spent with patient: 40 minutes.    Colen Darling, MD  November 08, 2020

## 2020-11-09 LAB — METABOLIC PANEL, BASIC
Anion gap: 8 mmol/L (ref 5–15)
BUN: 19 mg/dl (ref 9–23)
CO2: 27 mEq/L (ref 20–31)
Calcium: 8.4 mg/dl — ABNORMAL LOW (ref 8.7–10.4)
Chloride: 107 mEq/L (ref 98–107)
Creatinine: 0.72 mg/dl (ref 0.55–1.02)
GFR est AA: >60
GFR est non-AA: >60
Glucose: 169 mg/dl — ABNORMAL HIGH (ref 74–106)
Potassium: 3.9 mEq/L (ref 3.5–5.1)
Sodium: 142 mEq/L (ref 136–145)

## 2020-11-09 LAB — GLUCOSE, POC
Glucose (POC): 183 mg/dL — ABNORMAL HIGH (ref 65–105)
Glucose (POC): 142 mg/dL — ABNORMAL HIGH (ref 65–105)
Glucose (POC): 172 mg/dL — ABNORMAL HIGH (ref 65–105)
Glucose (POC): 185 mg/dL — ABNORMAL HIGH (ref 65–105)

## 2020-11-09 LAB — CBC WITH AUTOMATED DIFF
BASOPHILS: 0.2 % (ref 0–3)
EOSINOPHILS: 0 % (ref 0–5)
HCT: 34.6 % — ABNORMAL LOW (ref 37.0–50.0)
HGB: 11.6 gm/dl — ABNORMAL LOW (ref 13.0–17.2)
IMMATURE GRANULOCYTES: 1 % (ref 0.0–3.0)
LYMPHOCYTES: 4.6 % — ABNORMAL LOW (ref 28–48)
MCH: 33.7 pg (ref 25.4–34.6)
MCHC: 33.5 gm/dl (ref 30.0–36.0)
MCV: 100.6 fL — ABNORMAL HIGH (ref 80.0–98.0)
MONOCYTES: 4.1 % (ref 1–13)
MPV: 10.2 fL — ABNORMAL HIGH (ref 6.0–10.0)
NEUTROPHILS: 90.1 % — ABNORMAL HIGH (ref 34–64)
NRBC: 0 (ref 0–0)
PLATELET: 320 10*3/uL (ref 140–450)
RBC: 3.44 M/uL — ABNORMAL LOW (ref 3.60–5.20)
RDW-SD: 52 — ABNORMAL HIGH (ref 36.4–46.3)
WBC: 12.2 10*3/uL — ABNORMAL HIGH (ref 4.0–11.0)

## 2020-11-09 LAB — FIBRINOGEN
Fibrinogen: 292 mg/dl (ref 220–397)
Fibrinogen: 292 mg/dl (ref 220–397)

## 2020-11-09 LAB — MAGNESIUM
Magnesium: 1.7 mg/dL (ref 1.6–2.6)
Magnesium: 1.7 mg/dL (ref 1.6–2.6)

## 2020-11-09 LAB — D DIMER: D DIMER: 1.6 ug/mL (FEU) — ABNORMAL HIGH (ref 0.01–0.50)

## 2020-11-09 LAB — FERRITIN
Ferritin: 161.6 ng/ml (ref 7.3–270.7)
Ferritin: 161.6 ng/ml (ref 7.3–270.7)

## 2020-11-09 LAB — C REACTIVE PROTEIN, QT: C-Reactive protein: 23 mg/L — ABNORMAL HIGH (ref 0.00–9.90)

## 2020-11-09 LAB — CBC WITH AUTO DIFFERENTIAL
Basophils %: 0.2 % (ref 0–3)
Eosinophils %: 0 % (ref 0–5)
Hematocrit: 34.6 % — ABNORMAL LOW (ref 37.0–50.0)
Hemoglobin: 11.6 gm/dl — ABNORMAL LOW (ref 13.0–17.2)
Immature Granulocytes: 1 % (ref 0.0–3.0)
Lymphocytes %: 4.6 % — ABNORMAL LOW (ref 28–48)
MCH: 33.7 pg (ref 25.4–34.6)
MCHC: 33.5 gm/dl (ref 30.0–36.0)
MCV: 100.6 fL — ABNORMAL HIGH (ref 80.0–98.0)
MPV: 10.2 fL — ABNORMAL HIGH (ref 6.0–10.0)
Monocytes %: 4.1 % (ref 1–13)
Neutrophils %: 90.1 % — ABNORMAL HIGH (ref 34–64)
Nucleated RBCs: 0 (ref 0–0)
Platelets: 320 10*3/uL (ref 140–450)
RBC: 3.44 M/uL — ABNORMAL LOW (ref 3.60–5.20)
RDW-SD: 52 — ABNORMAL HIGH (ref 36.4–46.3)
WBC: 12.2 10*3/uL — ABNORMAL HIGH (ref 4.0–11.0)

## 2020-11-09 LAB — POCT GLUCOSE
POC Glucose: 183 mg/dL — ABNORMAL HIGH (ref 65–105)
POC Glucose: 142 mg/dL — ABNORMAL HIGH (ref 65–105)
POC Glucose: 172 mg/dL — ABNORMAL HIGH (ref 65–105)
POC Glucose: 185 mg/dL — ABNORMAL HIGH (ref 65–105)

## 2020-11-09 LAB — BASIC METABOLIC PANEL
Anion Gap: 8 mmol/L (ref 5–15)
BUN: 19 mg/dl (ref 9–23)
CO2: 27 mEq/L (ref 20–31)
Calcium: 8.4 mg/dl — ABNORMAL LOW (ref 8.7–10.4)
Chloride: 107 mEq/L (ref 98–107)
Creatinine: 0.72 mg/dl (ref 0.55–1.02)
EGFR IF NonAfrican American: >60
GFR African American: >60
Glucose: 169 mg/dl — ABNORMAL HIGH (ref 74–106)
Potassium: 3.9 mEq/L (ref 3.5–5.1)
Sodium: 142 mEq/L (ref 136–145)

## 2020-11-09 LAB — D-DIMER, QUANTITATIVE: D-Dimer, Quant: 1.6 ug/mL (FEU) — ABNORMAL HIGH (ref 0.01–0.50)

## 2020-11-09 LAB — C-REACTIVE PROTEIN: CRP: 23 mg/L — ABNORMAL HIGH (ref 0.00–9.90)

## 2020-11-09 MED FILL — TAMSULOSIN SR 0.4 MG 24 HR CAP: 0.4 mg | ORAL | Qty: 1

## 2020-11-09 MED FILL — SOLU-MEDROL (PF) 40 MG/ML SOLUTION FOR INJECTION: 40 mg/mL | INTRAMUSCULAR | Qty: 1

## 2020-11-09 MED FILL — LACTULOSE 20 GRAM/30 ML ORAL SOLUTION: 20 gram/30 mL | ORAL | Qty: 30

## 2020-11-09 MED FILL — PIPERACILLIN-TAZOBACTAM 3.375 GRAM IV SOLR: 3.375 gram | INTRAVENOUS | Qty: 3.38

## 2020-11-09 MED FILL — DEXTROMETHORPHAN-GUAIFENESIN 10 MG-100 MG/5 ML SYRUP: 100-10 mg/5 mL | ORAL | Qty: 10

## 2020-11-09 MED FILL — XIFAXAN 550 MG TABLET: 550 mg | ORAL | Qty: 1

## 2020-11-09 MED FILL — TYLENOL 325 MG TABLET: 325 mg | ORAL | Qty: 2

## 2020-11-09 MED FILL — ENOXAPARIN 40 MG/0.4 ML SUB-Q SYRINGE: 40 mg/0.4 mL | SUBCUTANEOUS | Qty: 0.4

## 2020-11-09 MED FILL — HYDROXYCHLOROQUINE 200 MG TAB: 200 mg | ORAL | Qty: 1

## 2020-11-09 MED FILL — POTASSIUM CHLORIDE SR 10 MEQ TAB: 10 mEq | ORAL | Qty: 4

## 2020-11-09 MED FILL — LEVOTHYROXINE 125 MCG TAB: 125 mcg | ORAL | Qty: 1

## 2020-11-09 MED FILL — METOPROLOL TARTRATE 25 MG TAB: 25 mg | ORAL | Qty: 1

## 2020-11-09 MED FILL — IPRATROPIUM-ALBUTEROL 2.5 MG-0.5 MG/3 ML NEB SOLUTION: 2.5 mg-0.5 mg/3 ml | RESPIRATORY_TRACT | Qty: 3

## 2020-11-09 MED FILL — FUROSEMIDE 20 MG TAB: 20 mg | ORAL | Qty: 1

## 2020-11-09 NOTE — Progress Notes (Signed)
Problem: Airway Clearance - Ineffective  Goal: Achieve or maintain patent airway  Outcome: Progressing Towards Goal     Problem: Gas Exchange - Impaired  Goal: Absence of hypoxia  Outcome: Progressing Towards Goal  Goal: Promote optimal lung function  Outcome: Progressing Towards Goal     Problem: Breathing Pattern - Ineffective  Goal: Ability to achieve and maintain a regular respiratory rate  Outcome: Progressing Towards Goal     Problem: Body Temperature -  Risk of, Imbalanced  Goal: Ability to maintain a body temperature within defined limits  Outcome: Progressing Towards Goal  Goal: Will regain or maintain usual level of consciousness  Outcome: Progressing Towards Goal  Goal: Complications related to the disease process, condition or treatment will be avoided or minimized  Outcome: Progressing Towards Goal     Problem: Isolation Precautions - Risk of Spread of Infection  Goal: Prevent transmission of infectious organism to others  Outcome: Progressing Towards Goal     Problem: Nutrition Deficits  Goal: Optimize nutrtional status  Outcome: Progressing Towards Goal     Problem: Risk for Fluid Volume Deficit  Goal: Maintain normal heart rhythm  Outcome: Progressing Towards Goal  Goal: Maintain absence of muscle cramping  Outcome: Progressing Towards Goal  Goal: Maintain normal serum potassium, sodium, calcium, phosphorus, and pH  Outcome: Progressing Towards Goal     Problem: Loneliness or Risk for Loneliness  Goal: Demonstrate positive use of time alone when socialization is not possible  Outcome: Progressing Towards Goal     Problem: Fatigue  Goal: Verbalize increase energy and improved vitality  Outcome: Progressing Towards Goal     Problem: Patient Education: Go to Patient Education Activity  Goal: Patient/Family Education  Outcome: Progressing Towards Goal     Problem: Pressure Injury - Risk of  Goal: *Prevention of pressure injury  Description: Document Braden Scale and appropriate interventions in the  flowsheet.  Outcome: Progressing Towards Goal  Note: Pressure Injury Interventions:  Sensory Interventions: Minimize linen layers    Moisture Interventions: Absorbent underpads, Minimize layers, Internal/External urinary devices    Activity Interventions: Pressure redistribution bed/mattress(bed type), PT/OT evaluation    Mobility Interventions: Pressure redistribution bed/mattress (bed type)    Nutrition Interventions: Document food/fluid/supplement intake    Friction and Shear Interventions: Apply protective barrier, creams and emollients, Minimize layers                Problem: Patient Education: Go to Patient Education Activity  Goal: Patient/Family Education  Outcome: Progressing Towards Goal     Problem: Falls - Risk of  Goal: *Absence of Falls  Description: Document Schmid Fall Risk and appropriate interventions in the flowsheet.  Outcome: Progressing Towards Goal  Note: Fall Risk Interventions:  Mobility Interventions: Bed/chair exit alarm, PT Consult for mobility concerns, OT consult for ADLs, Strengthening exercises (ROM-active/passive)    Mentation Interventions: Bed/chair exit alarm, Eyeglasses and hearing aids, Adequate sleep, hydration, pain control, Reorient patient, More frequent rounding    Medication Interventions: Bed/chair exit alarm    Elimination Interventions: Bed/chair exit alarm, Call light in reach    History of Falls Interventions: Bed/chair exit alarm, Room close to nurse's station         Problem: Patient Education: Go to Patient Education Activity  Goal: Patient/Family Education  Outcome: Progressing Towards Goal     Problem: Gas Exchange - Impaired  Goal: *Absence of hypoxia  Outcome: Progressing Towards Goal     Problem: Patient Education: Go to Patient Education Activity  Goal: Patient/Family Education  Outcome: Progressing Towards Goal

## 2020-11-09 NOTE — Progress Notes (Signed)
Progress Note             Daily Progress Note: 11/09/2020    Assessment/Plan:     COVID-19 pneumonia.  Last chest x-ray showed improving infiltrate right upper lobe.  Toxic metabolic encephalopathy.  Improving.  Mental status at her baseline.  New onset atrial fibrillation with controlled ventricular response.  Acute diastolic CHF.  Stable.  Cirrhosis of liver out hepatic encephalopathy.  Is stable.  Multiple falls.  Now undergoing physical therapy.  Hypothyroidism.  Stable.  Hemochromatosis.  Is stable.  Hypokalemia.  Is stable.  Hypomagnesemia.  Stable.  Plan  Continue present therapy.  Continue to monitor electrolytes and renal function.  Continue DVT prophylaxis.  Patient's condition is guarded.  Continue PT OT for muscle strengthening and gait training.  Patient will need short-term inpatient rehabilitation upon a stabilizing her condition.       Subjective:   None offered.    Review of Systems:  None offered.    Objective:   Physical Exam:     Visit Vitals  BP (!) 167/89 (BP 1 Location: Left upper arm, BP Patient Position: Supine)   Pulse 68   Temp 98.1 ??F (36.7 ??C)   Resp 19   Ht 5\' 6"  (1.676 m)   Wt 75.9 kg (167 lb 5.3 oz)   SpO2 96%   Breastfeeding No   BMI 27.01 kg/m??    O2 Flow Rate (L/min): 0 l/min O2 Device: None (Room air)    Temp (24hrs), Avg:97.6 ??F (36.4 ??C), Min:97.4 ??F (36.3 ??C), Max:98.1 ??F (36.7 ??C)    No intake/output data recorded.   08/25 1901 - 08/27 0700  In: 893 [P.O.:393; I.V.:500]  Out: 2750 [Urine:2750]    General:  Alert and oriented x3, cooperative, no distress, appears stated age.   Head:  Normocephalic, without obvious abnormality, atraumatic.   Eyes:  Conjunctivae/corneas clear. PERRL, EOMs intact.   Nose: Nares normal. Septum midline. Mucosa normal. No drainage or sinus tenderness.   Throat: Lips, mucosa, and tongue normal. Teeth and gums normal.   Neck: Supple, symmetrical, trachea midline, no adenopathy, thyroid: no enlargement/tenderness/nodules, no carotid bruit and no JVD.    Back:   Symmetric, no curvature. ROM normal. No CVA tenderness.   Lungs:   Clear to auscultation bilaterally.   Chest wall:  No tenderness or deformity.   Heart:  Regular rate and rhythm, S1, S2 normal, no murmur, click, rub or gallop.   Abdomen:   Soft, non-tender. Bowel sounds normal. No masses,  No organomegaly. Rectal deferred.   Lympathic: Within normal limits.   Vascular: Within normal limits   Extremities: Extremities normal, atraumatic, no cyanosis or edema.   Pulses: 2+ and symmetric all extremities.   Neurological: CNII-XII intact.   Mental Status:   Alert and oriented x3, no suicidal, homicidal or hallucinatory ideation   Skin: Good turgor, no rash or lesions     Data Review:       24 Hour Results:  Recent Results (from the past 24 hour(s))   GLUCOSE, POC    Collection Time: 11/08/20  4:06 PM   Result Value Ref Range    Glucose (POC) 165 (H) 65 - 105 mg/dL   GLUCOSE, POC    Collection Time: 11/08/20  9:29 PM   Result Value Ref Range    Glucose (POC) 183 (H) 65 - 105 mg/dL   CBC WITH AUTOMATED DIFF    Collection Time: 11/09/20  1:36 AM   Result Value Ref Range  WBC 12.2 (H) 4.0 - 11.0 1000/mm3    RBC 3.44 (L) 3.60 - 5.20 M/uL    HGB 11.6 (L) 13.0 - 17.2 gm/dl    HCT 23.7 (L) 62.8 - 50.0 %    MCV 100.6 (H) 80.0 - 98.0 fL    MCH 33.7 25.4 - 34.6 pg    MCHC 33.5 30.0 - 36.0 gm/dl    PLATELET 315 176 - 160 1000/mm3    MPV 10.2 (H) 6.0 - 10.0 fL    RDW-SD 52.0 (H) 36.4 - 46.3      NRBC 0 0 - 0      IMMATURE GRANULOCYTES 1.0 0.0 - 3.0 %    NEUTROPHILS 90.1 (H) 34 - 64 %    LYMPHOCYTES 4.6 (L) 28 - 48 %    MONOCYTES 4.1 1 - 13 %    EOSINOPHILS 0.0 0 - 5 %    BASOPHILS 0.2 0 - 3 %   METABOLIC PANEL, BASIC    Collection Time: 11/09/20  1:36 AM   Result Value Ref Range    Potassium 3.9 3.5 - 5.1 mEq/L    Chloride 107 98 - 107 mEq/L    Sodium 142 136 - 145 mEq/L    CO2 27 20 - 31 mEq/L    Glucose 169 (H) 74 - 106 mg/dl    BUN 19 9 - 23 mg/dl    Creatinine 7.37 1.06 - 1.02 mg/dl    GFR est AA >26.9      GFR est  non-AA >60      Calcium 8.4 (L) 8.7 - 10.4 mg/dl    Anion gap 8 5 - 15 mmol/L   MAGNESIUM    Collection Time: 11/09/20  1:36 AM   Result Value Ref Range    Magnesium 1.7 1.6 - 2.6 mg/dL   C REACTIVE PROTEIN, QT    Collection Time: 11/09/20  1:36 AM   Result Value Ref Range    C-Reactive protein 23.00 (H) 0.00 - 9.90 mg/L   FERRITIN    Collection Time: 11/09/20  1:36 AM   Result Value Ref Range    Ferritin 161.6 7.3 - 270.7 ng/ml   FIBRINOGEN    Collection Time: 11/09/20  1:36 AM   Result Value Ref Range    Fibrinogen 292 220 - 397 mg/dl   D DIMER    Collection Time: 11/09/20  1:36 AM   Result Value Ref Range    D DIMER 1.60 (H) 0.01 - 0.50 ug/mL (FEU)   GLUCOSE, POC    Collection Time: 11/09/20  6:37 AM   Result Value Ref Range    Glucose (POC) 142 (H) 65 - 105 mg/dL   GLUCOSE, POC    Collection Time: 11/09/20 12:23 PM   Result Value Ref Range    Glucose (POC) 172 (H) 65 - 105 mg/dL       Problem List:  Problem List as of 11/09/2020 Date Reviewed: 2020-11-17            Codes Class Noted - Resolved    COVID-19 ICD-10-CM: U07.1  ICD-9-CM: 079.89  10/21/2020 - Present    Overview Signed 11/01/2020  1:27 PM by Einar Gip, MD     10/21/20 SARS CoV-2 detected             * (Principal) Atrial fibrillation (HCC) ICD-10-CM: I48.91  ICD-9-CM: 427.31  10/31/2020 - Present    Overview Addendum 11/06/2020 11:15 AM by Einar Gip, MD     Noted  this AM (10/31/20)  Rate controlled (84 BPM)  No previously documented hx of AFIB  CHADS2VASc = 6  (08/27/17 CT Acute interhemispheric fissure subdural hemorrhage. Chronic small vessel ischemic change)  ECHO:  EF 75%< LV 25/44, S/PW 9/10, LAVI 28, Mild MR, Mod TR, RVSP 43             Other partial intestinal obstruction (HCC) ICD-10-CM: K56.690  ICD-9-CM: 560.89  10/31/2020 - Present    Overview Addendum 11/01/2020  1:28 PM by Einar GipStrosahl, Kurt F, MD     10/30/20 CT Small bowel obstruction secondary to presumed small bowel containing  umbilical hernia. Stranding within the hernia sac suggesting  engorgement. No  pneumatosis.  11/01/20 SURG We will continue to follow the patient to ensure she does not develop worsening incarceration and bowel obstruction from the hernia.  Currently if we can delay the surgery she would likely benefit from decreased risk of postoperative complications             Metabolic encephalopathy ICD-10-CM: G93.41  ICD-9-CM: 348.31  10/21/2020 - Present        Contusion of left hip ICD-10-CM: S70.02XA  ICD-9-CM: 924.01  10/21/2020 - Present        Left elbow contusion ICD-10-CM: S50.02XA  ICD-9-CM: 923.11  10/21/2020 - Present        Multiple falls ICD-10-CM: R29.6  ICD-9-CM: V15.88  10/21/2020 - Present        Deep venous thrombosis (HCC) ICD-10-CM: I82.409  ICD-9-CM: 453.40  Unknown - Present        Subdural hematoma caused by concussion [facility](HCC) ICD-10-CM: Z61.0R6ES06.5X9A  ICD-9-CM: 852.29  08/27/2017 - Present        Subdural hematoma (HCC) ICD-10-CM: A54.0J8JS06.5X9A  ICD-9-CM: 432.1  08/27/2017 - Present           Medications reviewed  Current Facility-Administered Medications   Medication Dose Route Frequency    furosemide (LASIX) tablet 20 mg  20 mg Oral DAILY    albuterol-ipratropium (DUO-NEB) 2.5 MG-0.5 MG/3 ML  3 mL Nebulization BID RT    levothyroxine (SYNTHROID) tablet 125 mcg  125 mcg Oral 6am    tamsulosin (FLOMAX) capsule 0.4 mg  0.4 mg Oral DAILY    enoxaparin (LOVENOX) injection 40 mg  40 mg SubCUTAneous Q24H    methylPREDNISolone (PF) (SOLU-MEDROL) injection 20 mg  20 mg IntraVENous Q8H    lactulose (CHRONULAC) 10 gram/15 mL solution 30 mL  20 g Oral BID    piperacillin-tazobactam (ZOSYN) 3.375 g in 0.9% sodium chloride (MBP/ADV) 100 mL MBP  3.375 g IntraVENous Q8H    guaiFENesin-dextromethorphan (ROBITUSSIN DM) 100-10 mg/5 mL syrup 10 mL  10 mL Oral Q6H PRN    metoprolol tartrate (LOPRESSOR) tablet 25 mg  25 mg Oral Q8H    [Held by provider] lisinopriL (PRINIVIL, ZESTRIL) tablet 40 mg  40 mg Oral DAILY    rifAXIMin (XIFAXAN) tablet 550 mg  550 mg Oral BID    [Held by provider] furosemide  (LASIX) tablet 40 mg  40 mg Oral DAILY    albuterol-ipratropium (DUO-NEB) 2.5 MG-0.5 MG/3 ML  3 mL Nebulization Q4H PRN    [Held by provider] magnesium oxide (MAG-OX) tablet 400 mg  400 mg Oral DAILY    [Held by provider] melatonin tablet 3 mg  3 mg Oral QHS    [Held by provider] potassium chloride (K-DUR, KLOR-CON M20) SR tablet 40 mEq  40 mEq Oral DAILY    [Held by provider] calcium-vitamin D (OS-CAL +D3) 500 mg-200 unit per  tablet 1 Tablet  1 Tablet Oral DAILY WITH LUNCH    hydrOXYchloroQUINE (PLAQUENIL) tablet 200 mg  200 mg Oral BID    [Held by provider] folic acid (FOLVITE) tablet 1 mg  1 mg Oral DAILY    naloxone (NARCAN) injection 0.1 mg  0.1 mg IntraVENous PRN    acetaminophen (TYLENOL) tablet 650 mg  650 mg Oral Q4H PRN    Or    acetaminophen (TYLENOL) solution 650 mg  650 mg Oral Q4H PRN    Or    acetaminophen (TYLENOL) suppository 650 mg  650 mg Rectal Q4H PRN    [Held by provider] HYDROcodone-acetaminophen (NORCO) 5-325 mg per tablet 1 Tablet  1 Tablet Oral Q6H PRN    ondansetron (ZOFRAN) injection 4 mg  4 mg IntraVENous Q4H PRN       Care Plan discussed with: RN    Total time spent with patient: 35 minutes.    Carmell Austria, MD  November 09, 2020    [facility] medical dictation software was used for portions of this report. Unintended voice recognition errors may occur.

## 2020-11-10 LAB — GLUCOSE, POC
Glucose (POC): 176 mg/dL — ABNORMAL HIGH (ref 65–105)
Glucose (POC): 153 mg/dL — ABNORMAL HIGH (ref 65–105)
Glucose (POC): 197 mg/dL — ABNORMAL HIGH (ref 65–105)
Glucose (POC): 171 mg/dL — ABNORMAL HIGH (ref 65–105)

## 2020-11-10 LAB — CULTURE, RESPIRATORY/SPUTUM/BRONCH W GRAM STAIN
GRAM STAIN: >25 — AB
Gram Stain Result: >25 — AB

## 2020-11-10 LAB — POCT GLUCOSE
POC Glucose: 176 mg/dL — ABNORMAL HIGH (ref 65–105)
POC Glucose: 153 mg/dL — ABNORMAL HIGH (ref 65–105)
POC Glucose: 197 mg/dL — ABNORMAL HIGH (ref 65–105)
POC Glucose: 171 mg/dL — ABNORMAL HIGH (ref 65–105)

## 2020-11-10 MED FILL — LACTULOSE 20 GRAM/30 ML ORAL SOLUTION: 20 gram/30 mL | ORAL | Qty: 30

## 2020-11-10 MED FILL — LEVOTHYROXINE 125 MCG TAB: 125 mcg | ORAL | Qty: 1

## 2020-11-10 MED FILL — IPRATROPIUM-ALBUTEROL 2.5 MG-0.5 MG/3 ML NEB SOLUTION: 2.5 mg-0.5 mg/3 ml | RESPIRATORY_TRACT | Qty: 3

## 2020-11-10 MED FILL — HYDROXYCHLOROQUINE 200 MG TAB: 200 mg | ORAL | Qty: 1

## 2020-11-10 MED FILL — ENOXAPARIN 40 MG/0.4 ML SUB-Q SYRINGE: 40 mg/0.4 mL | SUBCUTANEOUS | Qty: 0.4

## 2020-11-10 MED FILL — SOLU-MEDROL (PF) 40 MG/ML SOLUTION FOR INJECTION: 40 mg/mL | INTRAMUSCULAR | Qty: 1

## 2020-11-10 MED FILL — METOPROLOL TARTRATE 25 MG TAB: 25 mg | ORAL | Qty: 1

## 2020-11-10 MED FILL — PIPERACILLIN-TAZOBACTAM 3.375 GRAM IV SOLR: 3.375 gram | INTRAVENOUS | Qty: 3.38

## 2020-11-10 MED FILL — TAMSULOSIN SR 0.4 MG 24 HR CAP: 0.4 mg | ORAL | Qty: 1

## 2020-11-10 MED FILL — FUROSEMIDE 20 MG TAB: 20 mg | ORAL | Qty: 1

## 2020-11-10 MED FILL — XIFAXAN 550 MG TABLET: 550 mg | ORAL | Qty: 1

## 2020-11-10 NOTE — Progress Notes (Signed)
Problem: Isolation Precautions - Risk of Spread of Infection  Goal: Prevent transmission of infectious organism to others  Outcome: Progressing Towards Goal     Problem: Loneliness or Risk for Loneliness  Goal: Demonstrate positive use of time alone when socialization is not possible  Outcome: Progressing Towards Goal     Problem: Patient Education: Go to Patient Education Activity  Goal: Patient/Family Education  Outcome: Progressing Towards Goal     Problem: Pressure Injury - Risk of  Goal: *Prevention of pressure injury  Description: Document Braden Scale and appropriate interventions in the flowsheet.  Outcome: Progressing Towards Goal  Note: Pressure Injury Interventions:  Sensory Interventions: Assess changes in LOC, Keep linens dry and wrinkle-free, Minimize linen layers, Pressure redistribution bed/mattress (bed type)    Moisture Interventions: Absorbent underpads, Internal/External urinary devices, Minimize layers    Activity Interventions: Increase time out of bed, Pressure redistribution bed/mattress(bed type), PT/OT evaluation    Mobility Interventions: HOB 30 degrees or less, Pressure redistribution bed/mattress (bed type), PT/OT evaluation    Nutrition Interventions: Document food/fluid/supplement intake    Friction and Shear Interventions: HOB 30 degrees or less, Minimize layers                Problem: Patient Education: Go to Patient Education Activity  Goal: Patient/Family Education  Outcome: Progressing Towards Goal     Problem: Falls - Risk of  Goal: *Absence of Falls  Description: Document Schmid Fall Risk and appropriate interventions in the flowsheet.  Outcome: Progressing Towards Goal  Note: Fall Risk Interventions:  Mobility Interventions: Bed/chair exit alarm    Mentation Interventions: Bed/chair exit alarm, Adequate sleep, hydration, pain control, Evaluate medications/consider consulting pharmacy, Room close to nurse's station    Medication Interventions: Bed/chair exit alarm, Evaluate  medications/consider consulting pharmacy    Elimination Interventions: Bed/chair exit alarm, Call light in reach, Patient to call for help with toileting needs    History of Falls Interventions: Bed/chair exit alarm, Evaluate medications/consider consulting pharmacy         Problem: Patient Education: Go to Patient Education Activity  Goal: Patient/Family Education  Outcome: Progressing Towards Goal

## 2020-11-10 NOTE — Progress Notes (Signed)
Progress Note             Daily Progress Note: 11/10/2020    Assessment/Plan:     COVID-19 pneumonia.  Last chest x-ray showed improving infiltrate right upper lobe.  Clinically better.  No shortness of breath.  Toxic metabolic encephalopathy.  Improving.  Mental status at her baseline.  Oriented x3.  New onset atrial fibrillation with controlled ventricular response.  Acute diastolic CHF.  Stable.  Cirrhosis of liver out hepatic encephalopathy.  Is stable.  Multiple falls.  Now undergoing physical therapy.  Hypothyroidism.  Stable.  Hemochromatosis.  Is stable.  Hypokalemia.  Is stable.  Hypomagnesemia.  Stable.  Plan  Continue present therapy.  Continue to monitor electrolytes and renal function.  Continue DVT prophylaxis.  Patient's condition is guarded.  Continue PT OT for muscle strengthening and gait training.  Patient will need short-term inpatient rehabilitation upon a stabilizing her condition.       Subjective:   None offered.    Review of Systems:  None offered.    Objective:   Physical Exam:     Visit Vitals  BP (!) 153/95 (BP 1 Location: Left arm, BP Patient Position: Supine)   Pulse 70   Temp 97.9 ??F (36.6 ??C)   Resp 19   Ht 5\' 6"  (1.676 m)   Wt 76.5 kg (168 lb 10.4 oz)   SpO2 96%   Breastfeeding No   BMI 27.22 kg/m??    O2 Flow Rate (L/min): 0 l/min O2 Device: None (Room air)    Temp (24hrs), Avg:97.8 ??F (36.6 ??C), Min:97.5 ??F (36.4 ??C), Max:98.1 ??F (36.7 ??C)    08/28 0701 - 08/28 1900  In: -   Out: 400 [Urine:400]   08/26 1901 - 08/28 0700  In: 350 [P.O.:250; I.V.:100]  Out: 1500 [Urine:1500]    General:  Alert and oriented x3, cooperative, no distress, appears stated age.   Head:  Normocephalic, without obvious abnormality, atraumatic.   Eyes:  Conjunctivae/corneas clear. PERRL, EOMs intact.   Nose: Nares normal. Septum midline. Mucosa normal. No drainage or sinus tenderness.   Throat: Lips, mucosa, and tongue normal. Teeth and gums normal.   Neck: Supple, symmetrical, trachea midline, no adenopathy,  thyroid: no enlargement/tenderness/nodules, no carotid bruit and no JVD.   Back:   Symmetric, no curvature. ROM normal. No CVA tenderness.   Lungs:   Clear to auscultation bilaterally.   Chest wall:  No tenderness or deformity.   Heart:  Regular rate and rhythm, S1, S2 normal, no murmur, click, rub or gallop.   Abdomen:   Soft, non-tender. Bowel sounds normal. No masses,  No organomegaly. Rectal deferred.   Lympathic: Within normal limits.   Vascular: Within normal limits   Extremities: Extremities normal, atraumatic, no cyanosis or edema.   Pulses: 2+ and symmetric all extremities.   Neurological: CNII-XII intact.   Mental Status:   Alert and oriented x3, no suicidal, homicidal or hallucinatory ideation   Skin: Good turgor, no rash or lesions     Data Review:       24 Hour Results:  Recent Results (from the past 24 hour(s))   GLUCOSE, POC    Collection Time: 11/09/20 12:23 PM   Result Value Ref Range    Glucose (POC) 172 (H) 65 - 105 mg/dL   GLUCOSE, POC    Collection Time: 11/09/20  5:06 PM   Result Value Ref Range    Glucose (POC) 185 (H) 65 - 105 mg/dL   GLUCOSE, POC  Collection Time: 11/09/20  9:02 PM   Result Value Ref Range    Glucose (POC) 176 (H) 65 - 105 mg/dL   GLUCOSE, POC    Collection Time: 11/10/20  6:24 AM   Result Value Ref Range    Glucose (POC) 153 (H) 65 - 105 mg/dL   METABOLIC PANEL, COMPREHENSIVE    Collection Time: 11/10/20  6:34 AM   Result Value Ref Range    Potassium 4.1 3.5 - 5.1 mEq/L    Chloride 107 98 - 107 mEq/L    Sodium 143 136 - 145 mEq/L       Problem List:  Problem List as of 11/10/2020 Date Reviewed: 11/19/20            Codes Class Noted - Resolved    COVID-19 ICD-10-CM: U07.1  ICD-9-CM: 079.89  10/21/2020 - Present    Overview Signed 11/01/2020  1:27 PM by Einar Gip, MD     10/21/20 SARS CoV-2 detected             * (Principal) Atrial fibrillation (HCC) ICD-10-CM: I48.91  ICD-9-CM: 427.31  10/31/2020 - Present    Overview Addendum 11/06/2020 11:15 AM by Einar Gip, MD      Noted this AM (10/31/20)  Rate controlled (84 BPM)  No previously documented hx of AFIB  CHADS2VASc = 6  (08/27/17 CT Acute interhemispheric fissure subdural hemorrhage. Chronic small vessel ischemic change)  ECHO:  EF 75%< LV 25/44, S/PW 9/10, LAVI 28, Mild MR, Mod TR, RVSP 43             Other partial intestinal obstruction (HCC) ICD-10-CM: K56.690  ICD-9-CM: 560.89  10/31/2020 - Present    Overview Addendum 11/01/2020  1:28 PM by Einar Gip, MD     10/30/20 CT Small bowel obstruction secondary to presumed small bowel containing  umbilical hernia. Stranding within the hernia sac suggesting engorgement. No  pneumatosis.  11/01/20 SURG We will continue to follow the patient to ensure she does not develop worsening incarceration and bowel obstruction from the hernia.  Currently if we can delay the surgery she would likely benefit from decreased risk of postoperative complications             Metabolic encephalopathy ICD-10-CM: G93.41  ICD-9-CM: 348.31  10/21/2020 - Present        Contusion of left hip ICD-10-CM: S70.02XA  ICD-9-CM: 924.01  10/21/2020 - Present        Left elbow contusion ICD-10-CM: S50.02XA  ICD-9-CM: 923.11  10/21/2020 - Present        Multiple falls ICD-10-CM: R29.6  ICD-9-CM: V15.88  10/21/2020 - Present        Deep venous thrombosis (HCC) ICD-10-CM: I82.409  ICD-9-CM: 453.40  Unknown - Present        Subdural hematoma caused by concussion [facility]) ICD-10-CM: Y50.3T4S  ICD-9-CM: 852.29  08/27/2017 - Present        Subdural hematoma (HCC) ICD-10-CM: F68.1E7N  ICD-9-CM: 432.1  08/27/2017 - Present         Medications reviewed  Current Facility-Administered Medications   Medication Dose Route Frequency    furosemide (LASIX) tablet 20 mg  20 mg Oral DAILY    albuterol-ipratropium (DUO-NEB) 2.5 MG-0.5 MG/3 ML  3 mL Nebulization BID RT    levothyroxine (SYNTHROID) tablet 125 mcg  125 mcg Oral 6am    tamsulosin (FLOMAX) capsule 0.4 mg  0.4 mg Oral DAILY    enoxaparin (LOVENOX) injection 40 mg  40 mg SubCUTAneous  Q24H    methylPREDNISolone (PF) (SOLU-MEDROL) injection 20 mg  20 mg IntraVENous Q8H    lactulose (CHRONULAC) 10 gram/15 mL solution 30 mL  20 g Oral BID    piperacillin-tazobactam (ZOSYN) 3.375 g in 0.9% sodium chloride (MBP/ADV) 100 mL MBP  3.375 g IntraVENous Q8H    guaiFENesin-dextromethorphan (ROBITUSSIN DM) 100-10 mg/5 mL syrup 10 mL  10 mL Oral Q6H PRN    metoprolol tartrate (LOPRESSOR) tablet 25 mg  25 mg Oral Q8H    [Held by provider] lisinopriL (PRINIVIL, ZESTRIL) tablet 40 mg  40 mg Oral DAILY    rifAXIMin (XIFAXAN) tablet 550 mg  550 mg Oral BID    [Held by provider] furosemide (LASIX) tablet 40 mg  40 mg Oral DAILY    albuterol-ipratropium (DUO-NEB) 2.5 MG-0.5 MG/3 ML  3 mL Nebulization Q4H PRN    [Held by provider] magnesium oxide (MAG-OX) tablet 400 mg  400 mg Oral DAILY    [Held by provider] melatonin tablet 3 mg  3 mg Oral QHS    [Held by provider] potassium chloride (K-DUR, KLOR-CON M20) SR tablet 40 mEq  40 mEq Oral DAILY    [Held by provider] calcium-vitamin D (OS-CAL +D3) 500 mg-200 unit per tablet 1 Tablet  1 Tablet Oral DAILY WITH LUNCH    hydrOXYchloroQUINE (PLAQUENIL) tablet 200 mg  200 mg Oral BID    [Held by provider] folic acid (FOLVITE) tablet 1 mg  1 mg Oral DAILY    naloxone (NARCAN) injection 0.1 mg  0.1 mg IntraVENous PRN    acetaminophen (TYLENOL) tablet 650 mg  650 mg Oral Q4H PRN    Or    acetaminophen (TYLENOL) solution 650 mg  650 mg Oral Q4H PRN    Or    acetaminophen (TYLENOL) suppository 650 mg  650 mg Rectal Q4H PRN    [Held by provider] HYDROcodone-acetaminophen (NORCO) 5-325 mg per tablet 1 Tablet  1 Tablet Oral Q6H PRN    ondansetron (ZOFRAN) injection 4 mg  4 mg IntraVENous Q4H PRN       Care Plan discussed with: RN    Total time spent with patient: 35 minutes.    Carmell Austria, MD  November 10, 2020    [facility] medical dictation software was used for portions of this report. Unintended voice recognition errors may occur.

## 2020-11-11 LAB — METABOLIC PANEL, COMPREHENSIVE
ALT (SGPT): 18 U/L (ref 10–49)
AST (SGOT): 21 U/L (ref 0.0–33.9)
Albumin: 2.7 gm/dl — ABNORMAL LOW (ref 3.4–5.0)
Alk. phosphatase: 95 U/L (ref 46–116)
Anion gap: 13 mmol/L (ref 5–15)
BUN: 19 mg/dl (ref 9–23)
Bilirubin, total: 0.7 mg/dl (ref 0.30–1.20)
CO2: 24 mEq/L (ref 20–31)
Calcium: 8.8 mg/dl (ref 8.7–10.4)
Chloride: 107 mEq/L (ref 98–107)
Creatinine: 0.75 mg/dl (ref 0.55–1.02)
GFR est AA: >60
GFR est non-AA: >60
Glucose: 143 mg/dl — ABNORMAL HIGH (ref 74–106)
Potassium: 4.1 mEq/L (ref 3.5–5.1)
Protein, total: 5.9 gm/dl (ref 5.7–8.2)
Sodium: 143 mEq/L (ref 136–145)

## 2020-11-11 LAB — GLUCOSE, POC
Glucose (POC): 166 mg/dL — ABNORMAL HIGH (ref 65–105)
Glucose (POC): 157 mg/dL — ABNORMAL HIGH (ref 65–105)
Glucose (POC): 192 mg/dL — ABNORMAL HIGH (ref 65–105)
Glucose (POC): 188 mg/dL — ABNORMAL HIGH (ref 65–105)

## 2020-11-11 LAB — MAGNESIUM
Magnesium: 1.9 mg/dL (ref 1.6–2.6)
Magnesium: 1.9 mg/dL (ref 1.6–2.6)

## 2020-11-11 LAB — COMPREHENSIVE METABOLIC PANEL
ALT: 18 U/L (ref 10–49)
AST: 21 U/L (ref 0.0–33.9)
Albumin: 2.7 gm/dl — ABNORMAL LOW (ref 3.4–5.0)
Alkaline Phosphatase: 95 U/L (ref 46–116)
Anion Gap: 13 mmol/L (ref 5–15)
BUN: 19 mg/dl (ref 9–23)
CO2: 24 mEq/L (ref 20–31)
Calcium: 8.8 mg/dl (ref 8.7–10.4)
Chloride: 107 mEq/L (ref 98–107)
Creatinine: 0.75 mg/dl (ref 0.55–1.02)
EGFR IF NonAfrican American: >60
GFR African American: >60
Glucose: 143 mg/dl — ABNORMAL HIGH (ref 74–106)
Potassium: 4.1 mEq/L (ref 3.5–5.1)
Sodium: 143 mEq/L (ref 136–145)
Total Bilirubin: 0.7 mg/dl (ref 0.30–1.20)
Total Protein: 5.9 gm/dl (ref 5.7–8.2)

## 2020-11-11 LAB — POCT GLUCOSE
POC Glucose: 166 mg/dL — ABNORMAL HIGH (ref 65–105)
POC Glucose: 157 mg/dL — ABNORMAL HIGH (ref 65–105)
POC Glucose: 192 mg/dL — ABNORMAL HIGH (ref 65–105)
POC Glucose: 188 mg/dL — ABNORMAL HIGH (ref 65–105)

## 2020-11-11 MED ORDER — DEXAMETHASONE 4 MG TAB
4 mg | Freq: Two times a day (BID) | ORAL | Status: DC
Start: 2020-11-11 — End: 2020-11-12

## 2020-11-11 MED ORDER — IPRATROPIUM-ALBUTEROL 2.5 MG-0.5 MG/3 ML NEB SOLUTION
2.5 mg-0.5 mg/3 ml | RESPIRATORY_TRACT | Status: DC | PRN
Start: 2020-11-11 — End: 2020-11-13

## 2020-11-11 MED ORDER — IPRATROPIUM-ALBUTEROL 2.5 MG-0.5 MG/3 ML NEB SOLUTION
2.5 mg-0.5 mg/3 ml | Freq: Four times a day (QID) | RESPIRATORY_TRACT | Status: DC | PRN
Start: 2020-11-11 — End: 2020-11-11

## 2020-11-11 MED ORDER — LISINOPRIL 5 MG TAB
5 mg | Freq: Every day | ORAL | Status: DC
Start: 2020-11-11 — End: 2020-11-13
  Administered 2020-11-11 – 2020-11-13 (×3): via ORAL

## 2020-11-11 MED FILL — METOPROLOL TARTRATE 25 MG TAB: 25 mg | ORAL | Qty: 1

## 2020-11-11 MED FILL — TAMSULOSIN SR 0.4 MG 24 HR CAP: 0.4 mg | ORAL | Qty: 1

## 2020-11-11 MED FILL — SOLU-MEDROL (PF) 40 MG/ML SOLUTION FOR INJECTION: 40 mg/mL | INTRAMUSCULAR | Qty: 1

## 2020-11-11 MED FILL — LEVOTHYROXINE 125 MCG TAB: 125 mcg | ORAL | Qty: 1

## 2020-11-11 MED FILL — PIPERACILLIN-TAZOBACTAM 3.375 GRAM IV SOLR: 3.375 gram | INTRAVENOUS | Qty: 3.38

## 2020-11-11 MED FILL — LACTULOSE 20 GRAM/30 ML ORAL SOLUTION: 20 gram/30 mL | ORAL | Qty: 30

## 2020-11-11 MED FILL — XIFAXAN 550 MG TABLET: 550 mg | ORAL | Qty: 1

## 2020-11-11 MED FILL — ENOXAPARIN 40 MG/0.4 ML SUB-Q SYRINGE: 40 mg/0.4 mL | SUBCUTANEOUS | Qty: 0.4

## 2020-11-11 MED FILL — FUROSEMIDE 20 MG TAB: 20 mg | ORAL | Qty: 1

## 2020-11-11 MED FILL — LISINOPRIL 5 MG TAB: 5 mg | ORAL | Qty: 2

## 2020-11-11 MED FILL — HYDROXYCHLOROQUINE 200 MG TAB: 200 mg | ORAL | Qty: 1

## 2020-11-11 NOTE — Progress Notes (Signed)
Chaplain Follow-Up    Start Visit: 1256  End Visit: 1258    Chaplain conducted a Follow up consultation and Spiritual Assessment for [name] [name], who is a 87 y.o.,female.      The Chaplain provided the following Interventions:  Continued the relationship of care and support.   Offered prayer and assurance of continued prayer on patients behalf.   Chart reviewed.    The following outcomes were achieved:  Patient asleep for chaplain's visit.  Prayer at door,    Assessment:  There are no further spiritual or religious issues which require Pastoral Care Services interventions at this time.     Plan:  Chaplains will continue to follow and will provide pastoral care on an as needed/requested basis.    Nona Dell.  Scientist, water quality Care   [phone]

## 2020-11-11 NOTE — Progress Notes (Signed)
physical Therapy Treatment    Patient: [name] [name] (87 y.o. female)  Room: 2215/2215    Date: 11/11/2020  Start Time:  2:30 PM  End Time:  3:00 PM    Primary Diagnosis: Metabolic encephalopathy [G93.41]  Multiple falls [R29.6]  Contusion of left hip [S70.02XA]  Left elbow contusion [S50.02XA]         Precautions: Falls.  Weight bearing precautions: None    Orders reviewed, chart reviewed on [facility]. Discussed with RN.     ASSESSMENT :  Based on the objective data described below, the patient presents with  - supine>EOB modA HOB elevated  - modA to scoot forward toward EOB  - STS from EOB requires maxA and RW, able to stand x1 trial, 2 unsuccessful  - static and dynamic sitting EOB requires SBA  - modA to return to supine  - modA to EOB  - STS from EOB modA x2 RW  - 2 steps to bedside chair with modA x2 and RW    Progression toward goals:  []           Improving appropriately and progressing toward goals  [x]           Improving slowly and progressing toward goals  []           Not making progress toward goals and plan of care will be adjusted      Patient's rehabilitation potential is considered to be Fair    Recommendations:  Physical Therapy and Occupational Therapy  Discharge Recommendations: SNF  Further Equipment Recommendations for Discharge:  TBD        PLAN :   Patient will be followed by physical therapy  to address goals.            SUBJECTIVE:   Patient agreeable to PT      OBJECTIVE DATA SUMMARY:     Patient found: Bed, IV, and Bed alarm.    Pain Assessment before PT session: 0/10  Pain Location:    Pain Assessment after PT session: 0/10  Pain Location:      COGNITIVE STATUS:     Mental Status: Oriented x3.  Communication: normal.  Follows commands: tactile cues, verbal cues, and visual cues.  General Cognition: impaired safety.  Safety/Judgement:  requires cues for safety during mobility .      Functional Mobility and Balance Status:     Bed mobility:    Scooting:        modA    Rolling:            NT      Supine > sit:   modA    Sit > supine:   modA    Transfers:     Sit > stand: maxA OR modA x2 RW    Stand > sit: modA    Balance:   Static sitting balance:          good  Dynamic sitting balance:     good  Static standing balance:      poor  Dynamic standing balance: poor      Ambulation/Gait Training:  2 steps to bedside chair, unsteady, step to, decreased cadence, decreased step height/length, shuffled, downward gaze, flexed posture, and difficulty/unable to weight shift, Walker, mod assist, verbal cues, manual cues, safety concerns, increased time, and 2 person      Therapeutic Exercises:      LE ex's:  Seated, LAQ"s, hip add with pillow, ankle pumps, marches 10x each cues  and rest breaks required    Balance Activities:   Seated and Standing    Activity Tolerance:   poor    Final Location:   bedside chair, all needs close, agrees to call for assistance, nurse notified , and family present    COMMUNICATION/EDUCATION:     Education: Ill effects of bedrest, benefits of activity while hospitalized, OOB with assist from staff, call staff for assistance, HEP, positioning, safety, DME, disposition, role of PT, PT plan of care.    Barriers to Learning/Limitations: none    Please refer to care plan and patient education section for further details.    Thank you for this referral.  Gabriel Cirri Dozier, PTA

## 2020-11-11 NOTE — Progress Notes (Signed)
Progress Note             Daily Progress Note: 11/11/2020    Assessment/Plan:          Pt perssts with mild confusion and intermittent hallucinations/delusions.   Oriented to place, year, month, not day or date.     Appetite low.   Doesn't like thickened liquids.   Denies sob/chest pain/nausea.  Initially c/o abd pain but exam non-tender.     MBS + for aspiration of thin liquids.          Afebrile.   O2 sat 98% on RA.   AF with CVR.   Lungs with improved rhonchi over weekend.        Probable Covid-19 pneumonitis +/- inflammatory lung reaction, somewhat later presentation 12-14 days after initial + test.   DC contact isolation.   Change Solumedrol to Decadron in am through 11/14/20.      -   Strep/Legionella ag neg.   MRSA swab neg.   Sputum Cx if able would be helpful.     -   Possible aspiration PNA.   Continue Zosyn to complete 7 days by tomorrow.      -   Vanco DCd 8/24.           -   Continue IV Solumedrol, nebs.       - s/p 2 vaccines and one booster.     -   Pt is on 20 day quarantine.    Right pleural effusion.   Very small on CXR 8/24.     Acute diastolic CHF sec AF.   Improved.   Resume scheduled po Lasix.    Umbilical hernia with recent PSBO.   Trial advance diet today.       - tentative plan is for outpatient surgical repair of hernia once acute conditions resolve.                (S/p sigmoid resection and bladder repair 06/2003, prior TAH, then subsequent BSO).  5.   Acute urinary retention.   Voiding trial today.  Flomax added 11/07/20.     6.    New onset atrial fibrillation with CVR on Metoprolol.           Echo 8/18:   EF 75%   est RVSP:9mmHg       Pt not a candidate for long term anticoagulation secondary to high fall risk.   Decrease Lovenox to 40 mg SQ every day for DVT prophylaxis, Covid-19.   Discussed risk/benefit with patient though not 100% sure she fully grasps.  7.   Hepatic encephalopathy, hallucinations.       Appreciate neurology's assessment.   Possible concurrent Lewy Body Dementia.  8.    Severe protein-calorie malnutrition.   9.   Cirrhosis of liver (Hemochromatosis, Methotrexate for several years for RA) with hepatic encephalopathy and mild auto-anticoagulation (INR 1.4 on admission).    Continue dual tx with Lactulose and Xifaxin for now.   No additional WU at this time per GI.  10   Multiple falls, 2 within 24 hrs of admission.    Hx multiple prior orthopedic surgeries.  11.  Hypothyroidism, inadequate replacement.   Dose increased from 100 to 125 mcg this admission.  TSH improving.  12    Hemochromatosis receiving periodic phlebotomies (~ 2/year) via Dr. Arnette Felts.    13.   RA, on Methotrexate approximately the last five years, DC'd about a month ago.  Continue Plaquenil.   Would  have to wait a few weeks for a new DMARD with #1.   (Dr. Salem Casterixit)  14.   Hx subdural hematoma 08/2017, not requiring surgery.  15.  HTN.   Resume Lisinopril.  16.   Hypokalemia, hypomagnesemia, replace.    Voiding trial.   Complete 7 d Zosyn.   Tentative transfer to rehab in am.         Subjective:      Review of Systems:  ROS    Objective:   Physical Exam: Physical Exam  Cardiovascular:      Rate and Rhythm: Normal rate. Rhythm irregular.   Pulmonary:      Effort: Pulmonary effort is normal.      Breath sounds: Rhonchi present. No wheezing.   Abdominal:      General: Bowel sounds are normal.      Tenderness: There is no abdominal tenderness. There is no guarding or rebound.      Hernia: A hernia is present.   Musculoskeletal:      Right lower leg: No edema.      Left lower leg: No edema.   Neurological:      Mental Status: She is alert.       Visit Vitals  BP (!) 152/83 (BP 1 Location: Left upper arm, BP Patient Position: Supine)   Pulse 71   Temp 97.6 ??F (36.4 ??C)   Resp 18   Ht 5\' 6"  (1.676 m)   Wt 73 kg (160 lb 15 oz)   SpO2 98%   Breastfeeding No   BMI 25.98 kg/m??    O2 Flow Rate (L/min): 0 l/min O2 Device: None (Room air)    Temp (24hrs), Avg:97.7 ??F (36.5 ??C), Min:97.4 ??F (36.3 ??C), Max:98 ??F (36.7 ??C)    08/29 0701  - 08/29 1900  In: 320 [P.O.:120; I.V.:200]  Out: -    08/27 1901 - 08/29 0700  In: 660 [P.O.:360; I.V.:300]  Out: 2825 [Urine:2825]      Data Review:       24 Hour Results:  Recent Results (from the past 24 hour(s))   GLUCOSE, POC    Collection Time: 11/10/20 11:51 AM   Result Value Ref Range    Glucose (POC) 197 (H) 65 - 105 mg/dL   GLUCOSE, POC    Collection Time: 11/10/20  4:56 PM   Result Value Ref Range    Glucose (POC) 171 (H) 65 - 105 mg/dL   GLUCOSE, POC    Collection Time: 11/10/20 11:30 PM   Result Value Ref Range    Glucose (POC) 166 (H) 65 - 105 mg/dL   GLUCOSE, POC    Collection Time: 11/11/20  6:24 AM   Result Value Ref Range    Glucose (POC) 157 (H) 65 - 105 mg/dL               Problem List:  Problem List as of 11/11/2020 Date Reviewed: 11/04/2020            Codes Class Noted - Resolved    COVID-19 ICD-10-CM: U07.1  ICD-9-CM: 079.89  10/21/2020 - Present    Overview Signed 11/01/2020  1:27 PM by Einar GipStrosahl, Kurt F, MD     10/21/20 SARS CoV-2 detected             * (Principal) Atrial fibrillation (HCC) ICD-10-CM: I48.91  ICD-9-CM: 427.31  10/31/2020 - Present    Overview Addendum 11/06/2020 11:15 AM by Einar GipStrosahl, Kurt F, MD     Noted this AM (10/31/20)  Rate controlled (84 BPM)  No previously documented hx of AFIB  CHADS2VASc = 6  (08/27/17 CT Acute interhemispheric fissure subdural hemorrhage. Chronic small vessel ischemic change)  ECHO:  EF 75%< LV 25/44, S/PW 9/10, LAVI 28, Mild MR, Mod TR, RVSP 43             Other partial intestinal obstruction (HCC) ICD-10-CM: K56.690  ICD-9-CM: 560.89  10/31/2020 - Present    Overview Addendum 11/01/2020  1:28 PM by Einar Gip, MD     10/30/20 CT Small bowel obstruction secondary to presumed small bowel containing  umbilical hernia. Stranding within the hernia sac suggesting engorgement. No  pneumatosis.  11/01/20 SURG We will continue to follow the patient to ensure she does not develop worsening incarceration and bowel obstruction from the hernia.  Currently if we can  delay the surgery she would likely benefit from decreased risk of postoperative complications             Metabolic encephalopathy ICD-10-CM: G93.41  ICD-9-CM: 348.31  10/21/2020 - Present        Contusion of left hip ICD-10-CM: S70.02XA  ICD-9-CM: 924.01  10/21/2020 - Present        Left elbow contusion ICD-10-CM: S50.02XA  ICD-9-CM: 923.11  10/21/2020 - Present        Multiple falls ICD-10-CM: R29.6  ICD-9-CM: V15.88  10/21/2020 - Present        Deep venous thrombosis (HCC) ICD-10-CM: I82.409  ICD-9-CM: 453.40  Unknown - Present        Subdural hematoma caused by concussion [facility]) ICD-10-CM: M42.6S3M  ICD-9-CM: 852.29  08/27/2017 - Present        Subdural hematoma (HCC) ICD-10-CM: H96.2I2L  ICD-9-CM: 432.1  08/27/2017 - Present       Medications reviewed  Current Facility-Administered Medications   Medication Dose Route Frequency    furosemide (LASIX) tablet 20 mg  20 mg Oral DAILY    albuterol-ipratropium (DUO-NEB) 2.5 MG-0.5 MG/3 ML  3 mL Nebulization BID RT    levothyroxine (SYNTHROID) tablet 125 mcg  125 mcg Oral 6am    tamsulosin (FLOMAX) capsule 0.4 mg  0.4 mg Oral DAILY    enoxaparin (LOVENOX) injection 40 mg  40 mg SubCUTAneous Q24H    methylPREDNISolone (PF) (SOLU-MEDROL) injection 20 mg  20 mg IntraVENous Q8H    lactulose (CHRONULAC) 10 gram/15 mL solution 30 mL  20 g Oral BID    piperacillin-tazobactam (ZOSYN) 3.375 g in 0.9% sodium chloride (MBP/ADV) 100 mL MBP  3.375 g IntraVENous Q8H    guaiFENesin-dextromethorphan (ROBITUSSIN DM) 100-10 mg/5 mL syrup 10 mL  10 mL Oral Q6H PRN    metoprolol tartrate (LOPRESSOR) tablet 25 mg  25 mg Oral Q8H    [Held by provider] lisinopriL (PRINIVIL, ZESTRIL) tablet 40 mg  40 mg Oral DAILY    rifAXIMin (XIFAXAN) tablet 550 mg  550 mg Oral BID    [Held by provider] furosemide (LASIX) tablet 40 mg  40 mg Oral DAILY    albuterol-ipratropium (DUO-NEB) 2.5 MG-0.5 MG/3 ML  3 mL Nebulization Q4H PRN    [Held by provider] magnesium oxide (MAG-OX) tablet 400 mg  400 mg Oral DAILY     [Held by provider] melatonin tablet 3 mg  3 mg Oral QHS    [Held by provider] potassium chloride (K-DUR, KLOR-CON M20) SR tablet 40 mEq  40 mEq Oral DAILY    [Held by provider] calcium-vitamin D (OS-CAL +D3) 500 mg-200 unit per tablet 1 Tablet  1 Tablet Oral DAILY  WITH LUNCH    hydrOXYchloroQUINE (PLAQUENIL) tablet 200 mg  200 mg Oral BID    [Held by provider] folic acid (FOLVITE) tablet 1 mg  1 mg Oral DAILY    naloxone (NARCAN) injection 0.1 mg  0.1 mg IntraVENous PRN    acetaminophen (TYLENOL) tablet 650 mg  650 mg Oral Q4H PRN    Or    acetaminophen (TYLENOL) solution 650 mg  650 mg Oral Q4H PRN    Or    acetaminophen (TYLENOL) suppository 650 mg  650 mg Rectal Q4H PRN    [Held by provider] HYDROcodone-acetaminophen (NORCO) 5-325 mg per tablet 1 Tablet  1 Tablet Oral Q6H PRN    ondansetron (ZOFRAN) injection 4 mg  4 mg IntraVENous Q4H PRN       Care Plan discussed with: Patient/Family and Nurse    Total time spent with patient: 30 minutes.    [name], MD  November 11, 2020

## 2020-11-11 NOTE — Progress Notes (Signed)
Pt meets criteria for d/c of COVID precautions.

## 2020-11-12 LAB — GLUCOSE, POC
Glucose (POC): 223 mg/dL — ABNORMAL HIGH (ref 65–105)
Glucose (POC): 173 mg/dL — ABNORMAL HIGH (ref 65–105)

## 2020-11-12 LAB — POCT GLUCOSE
POC Glucose: 223 mg/dL — ABNORMAL HIGH (ref 65–105)
POC Glucose: 173 mg/dL — ABNORMAL HIGH (ref 65–105)

## 2020-11-12 MED ORDER — DEXAMETHASONE 4 MG TAB
4 mg | Freq: Every day | ORAL | Status: DC
Start: 2020-11-12 — End: 2020-11-13
  Administered 2020-11-13: 14:00:00 via ORAL

## 2020-11-12 MED FILL — HYDROXYCHLOROQUINE 200 MG TAB: 200 mg | ORAL | Qty: 1

## 2020-11-12 MED FILL — LISINOPRIL 5 MG TAB: 5 mg | ORAL | Qty: 2

## 2020-11-12 MED FILL — XIFAXAN 550 MG TABLET: 550 mg | ORAL | Qty: 1

## 2020-11-12 MED FILL — SOLU-MEDROL (PF) 40 MG/ML SOLUTION FOR INJECTION: 40 mg/mL | INTRAMUSCULAR | Qty: 1

## 2020-11-12 MED FILL — LEVOTHYROXINE 125 MCG TAB: 125 mcg | ORAL | Qty: 1

## 2020-11-12 MED FILL — METOPROLOL TARTRATE 25 MG TAB: 25 mg | ORAL | Qty: 1

## 2020-11-12 MED FILL — FUROSEMIDE 20 MG TAB: 20 mg | ORAL | Qty: 1

## 2020-11-12 MED FILL — LACTULOSE 20 GRAM/30 ML ORAL SOLUTION: 20 gram/30 mL | ORAL | Qty: 30

## 2020-11-12 MED FILL — TAMSULOSIN SR 0.4 MG 24 HR CAP: 0.4 mg | ORAL | Qty: 1

## 2020-11-12 MED FILL — PIPERACILLIN-TAZOBACTAM 3.375 GRAM IV SOLR: 3.375 gram | INTRAVENOUS | Qty: 3.38

## 2020-11-12 NOTE — Progress Notes (Signed)
 SPEECH LANGUAGE PATHOLOGY   BEDSIDE DYSPHAGIA TREATMENT      Patient: [name] [name] (87 y.o. female) 1933/12/03  Room: 2215/2215  Primary Diagnosis: Metabolic encephalopathy [G93.41]  Multiple falls [R29.6]  Contusion of left hip [S70.02XA]  Left elbow contusion [S50.02XA]          Isolation:  There are currently no Active Isolations       MDRO: No current active infections  PPE: surgical mask, gloves  Precautions:  universal and aspiration     Date: 11/12/2020  Start Time:  1440 End Time:  1450    TREATMENT:    Orientation: Pt awake   Respiratory Status: RA    Treatment: Swallow tx completed b/s with therapeutic trials of IDDSI 0- thin, IDDSI 2 mildly thick (nectar) for possible diet advancement.  Pt appearing with improved mentation during session, agreeable to trials of small sips of water via straw.  Per MBS last week, pt with prominent cervical osteophytes at C3/C4 directly impacting epiglottic ROM for airway protection during the swallow.  This resulted in frank audible aspiration of thin liquids via straw.  Pt did not aspirate with every trial of thin under fluoro, however, she is at risk for aspiration d/t osteophytes, and the use of swallowing exercises are guarded d/t the presence of the osteophytes and pt's lack of carryover.  Trials of thin via controlled cup sips were completed today.  Brief cough noted x1.  Subsequent trials of nectar thick liquids continue to be tolerated well, with adequate laryngeal elevation and no wet breath sounds post swallows.  Clinically, pt is a candidate for free water PO (ice chips, small sips of thin liquids PRN, in between meals) following strict aspiration precautions.  Would continue IDDSI-2 (mildly thickened liquids) during meals as the long term safest liquid to prevent prandial aspiration.      Functional Oral Intake Scale: Level 5: Total oral diet of multiple consistencies but requiring special preparation or compensations    PLAN/ RECOMMENDATIONS:                  Diet Recommendations: continue IDDSI 6- soft & bite sized, IDDSI 2 mildly thick (nectar), THIN LIQUIDS PRN IN BETWEEN MEALS     Compensatory Swallow Strategies: HOB ~60* for all po feeds and >45* at least 30 minutes following, small bites/ sip, slow rate of eating/ feeding, clear throat periodically   Medication Administration: crushed in puree   Aspiration Precautions: may feed self independently  Risk(s) for Aspiration: advanced age , medical condition, current level of dysphagia     Therapy Recommendations: pt would benefit from continuation of SLP for dysphagia 1-3x/ week to address goals per POC    Therapy Prognosis: fair    Discharge Recommendations: to be determined  Other Recommended Services: Palliative care     SUBJECTIVE:    Patient Stated: My dtr in law brought those re: candy.   Pain Prior to Treatment: no pain s/s observed   Pain Following Treatment: no pain s/s observed     OBJECTIVE:    Position: HOB ~60*  Consistencies: thin and mildly thick (nectar) single sips and via straw,    Oral Phase: WFL  Pharyngeal Phase: WFL and throat clear  Exercises:  n/a    Safety: Following session, pt tray table, phone, and call bell within reach   Modified Rankin Score (MRS): Defer to OT/ PT    EDUCATION:   Education Provided: POC and diet recommendations  Individual Educated: patient  Comprehension: no  family present  Staff Education: Educated NCP to POC and diet recommendations.       Thank you,  Corean Abbot M.A., CCC-SLP  Speech-Language Pathologist   Secure Chat/office at [phone]

## 2020-11-12 NOTE — Progress Notes (Signed)
SW spoke w/ Dr. Emmit Alexanders regarding signing the LTSS.  Dr. Emmit Alexanders reports that he has taken steps in getting his credential and plans to sign the screening soon.

## 2020-11-12 NOTE — Progress Notes (Signed)
Problem: Airway Clearance - Ineffective  Goal: Achieve or maintain patent airway  Outcome: Progressing Towards Goal     Problem: Gas Exchange - Impaired  Goal: Absence of hypoxia  Outcome: Progressing Towards Goal  Goal: Promote optimal lung function  Outcome: Progressing Towards Goal     Problem: Breathing Pattern - Ineffective  Goal: Ability to achieve and maintain a regular respiratory rate  Outcome: Progressing Towards Goal     Problem: Body Temperature -  Risk of, Imbalanced  Goal: Ability to maintain a body temperature within defined limits  Outcome: Progressing Towards Goal  Goal: Will regain or maintain usual level of consciousness  Outcome: Progressing Towards Goal  Goal: Complications related to the disease process, condition or treatment will be avoided or minimized  Outcome: Progressing Towards Goal     Problem: Isolation Precautions - Risk of Spread of Infection  Goal: Prevent transmission of infectious organism to others  Outcome: Progressing Towards Goal     Problem: Nutrition Deficits  Goal: Optimize nutrtional status  Outcome: Progressing Towards Goal     Problem: Risk for Fluid Volume Deficit  Goal: Maintain normal heart rhythm  Outcome: Progressing Towards Goal  Goal: Maintain absence of muscle cramping  Outcome: Progressing Towards Goal  Goal: Maintain normal serum potassium, sodium, calcium, phosphorus, and pH  Outcome: Progressing Towards Goal     Problem: Loneliness or Risk for Loneliness  Goal: Demonstrate positive use of time alone when socialization is not possible  Outcome: Progressing Towards Goal     Problem: Fatigue  Goal: Verbalize increase energy and improved vitality  Outcome: Progressing Towards Goal     Problem: Patient Education: Go to Patient Education Activity  Goal: Patient/Family Education  Outcome: Progressing Towards Goal     Problem: Pressure Injury - Risk of  Goal: *Prevention of pressure injury  Description: Document Braden Scale and appropriate interventions in the  flowsheet.  Outcome: Progressing Towards Goal  Note: Pressure Injury Interventions:  Sensory Interventions: Assess changes in LOC    Moisture Interventions: Absorbent underpads, Internal/External urinary devices    Activity Interventions: PT/OT evaluation    Mobility Interventions: PT/OT evaluation    Nutrition Interventions: Document food/fluid/supplement intake    Friction and Shear Interventions: HOB 30 degrees or less, Minimize layers                Problem: Patient Education: Go to Patient Education Activity  Goal: Patient/Family Education  Outcome: Progressing Towards Goal     Problem: Falls - Risk of  Goal: *Absence of Falls  Description: Document Schmid Fall Risk and appropriate interventions in the flowsheet.  Outcome: Progressing Towards Goal  Note: Fall Risk Interventions:  Mobility Interventions: Bed/chair exit alarm, Patient to call before getting OOB, PT Consult for mobility concerns    Mentation Interventions: Bed/chair exit alarm, Eyeglasses and hearing aids, Room close to nurse's station    Medication Interventions: Bed/chair exit alarm    Elimination Interventions: Bed/chair exit alarm, Call light in reach    History of Falls Interventions: Bed/chair exit alarm         Problem: Patient Education: Go to Patient Education Activity  Goal: Patient/Family Education  Outcome: Progressing Towards Goal     Problem: Gas Exchange - Impaired  Goal: *Absence of hypoxia  Outcome: Progressing Towards Goal     Problem: Patient Education: Go to Patient Education Activity  Goal: Patient/Family Education  Outcome: Progressing Towards Goal

## 2020-11-12 NOTE — Discharge Summary (Signed)
[facility]  Transfer Summary  NAME:  [name] [name], [name] [name]  SEX:   F  ADMIT: 10/21/2020  DISCH:   DOB: [birth year]  MR#    [id number]  ACCT#  [id number]    cc: Caralyn Guile DO;   Elveria Rising MD;   Roma Schanz MD;   Drinda Butts MD;   Evlyn Kanner DO      DATE OF ADMISSION:   10/21/2020     TENTATIVE DATE OF TRANSFER:  11/12/2020     DISCHARGE DIAGNOSES:   1.  Probable COVID-19 pneumonitis and/or inflammatory lung reaction involving the right long only.  2.  Possible aspiration pneumonia.  The patient with aspiration of thin liquids with modified barium swallow.  3.  Right pleural effusion.  4.  Acute diastolic congestive heart failure, likely secondary to atrial fibrillation.  5.  New onset atrial fibrillation.  Controlled ventricular rate on metoprolol.  Echocardiogram 10/31/2020 showing an ejection fraction of 75% with an estimated right ventricular systolic pressure of 43 mmHg.  The patient is not a candidate for long-term anticoagulation secondary to a very high fall risk.  6.  Cirrhosis of the liver, likely secondary to effects of hemochromatosis and the patient on methotrexate for multiple years for rheumatoid arthritis.  7.  Hepatic encephalopathy and mild auto anticoagulation, INR 1.4 on admission.  8.  Possible concurrent Lewy Body dementia.  The patient with intermittent hallucinations and delusions that are nonthreatening.  Not entirely clear if all of that can be explained by her cirrhosis and hepatic encephalopathy.  9.  Severe protein calorie malnutrition.  10.  Multiple falls, 2 within 24 hours of admission, but also a history of multiple falls prior to that.  The patient has had multiple prior orthopedic surgeries.  11.  History of a subdural hematoma 08/2017, not requiring surgery.  12.  Periumbilical incarcerated hernia with transient partial small-bowel obstruction.  Recommendations per surgery were for subacute repair after the patient can regain some physical strength and improve her  nutritional status.  13.  Hypothyroidism, on inadequate replacement prior to admission.  Dose increased this admission with improvement in her TSH levels.  14.  Hemochromatosis requiring periodic phlebotomies about 2 per year via Dr. Arnette Felts.  15.  Rheumatoid arthritis, on methotrexate, approximately for 5 years.  Methotrexate discontinued about a month ago with her cirrhosis.  The patient continued on Plaquenil.  Will have to wait for a period of time to potentially decide on a new DMARD (Dr. Salem Caster).  16.  Hypertension.  17.  Hypokalemia.  18.  Hypomagnesemia.  19.  Acute urinary retention.     HISTORY OF PRESENT ILLNESS:   Please see H&P for details.  The patient is an 87 year old female resident of a local adult care facility, who was noted by staff there to have fallen twice within 24 hours of coming to the emergency room.  She was also noted to be more confused.     The patient started to develop increased problems with confusion and intermittent hallucinations about 2 to 3 months prior to admission.  Evaluation as an outpatient showed her to have an elevated ammonia level greater than 100.  She was started on low-dose lactulose, her methotrexate discontinued and Plaquenil was continued.  Per the patient's son, her confusion improved significantly, but within the week or 2 prior to this admission, her confusion and intermittent hallucinations became more pronounced.     In the ER, the patient was afebrile, blood pressure 151/86, pulse of 64,  respiratory rate 18, O2 sat 98% on room air.  She had normal sinus rhythm with occasional PVCs on telemetry.  X-ray of the left elbow showed no fracture.  X-ray of the left hip also showed no fracture as did an x-ray of her left shoulder.  White blood cell count was normal.  Potassium was a little low at 3.0, magnesium level 1.7.  BUN was 10, creatinine 0.71.  Her SGOT was mildly elevated at 58, bilirubin 1.71.  SGPT and alkaline phosphatase levels were normal.  Ammonia  level was elevated at 45.  Prior values were 61 on 10/10/2020 and 106 on 09/24/2020.  TSH was elevated at 9.565, free T4 low at 0.66.  CT scan of her head showed no acute abnormalities.  CT of the neck showed no acute abnormality.  The patient was admitted to a general medical floor with metabolic encephalopathy for further evaluation and treatment.     While the patient was in the ER, her assisted living facility contacted the emergency room with information that a number of the patients at their facility with whom the patient interacts had become COVID positive.  The patient was screened for COVID-19 and did in fact end up being COVID positive.     HOSPITAL COURSE:   The patient had a fairly extended hospital course.  In regards to her falls, she was seen by physical therapy and occupational therapy throughout the hospitalization.  She had her dose of lactulose increased, but was not able to tolerate the higher dose due to diarrhea.  Rifaximin was added to her medical regimen.  She was seen in consultation by gastroenterology with a recommendation that no additional workup would be required at this time.     The patient developed abdominal distention and increased pain over a periumbilical hernia with some skin changes of bruising.  A KUB of the abdomen showed an ileus and constipation.  The patient developed nausea, vomiting, had attempts at an NG tube placement for several days with limited success.     The patient was seen in consultation by general surgery.  She had a CT scan of her abdomen and pelvis done with contrast, which showed a small-bowel obstruction secondary to a presumed small bowel containing umbilical hernia.  There was some stranding within the hernia sac without pneumatosis.  Her urinary bladder was markedly distended.  She had 2 irregular right lung densities also incidentally noted.  General surgery followed the patient very closely and her abdominal distention improved gradually over time.   The ultimate recommendation was to pursue repair of her umbilical hernia subacutely in several weeks after the patient has a chance to get over her COVID, gain some strength with physical therapy hopefully rebound nutritionally as well.     On the 12th day of admission, approximately, the patient developed some wheezing, increased shortness of breath, mildly increased cough.  A CT scan of her chest showed extensive nodular and reticular nodular infiltrates opacities involving the right long. Additionally, there was a moderate right pleural effusion seen.  Mild mediastinal and hilar lymphadenopathy was noted, possibly reactive in nature.     The patient was started on broad-spectrum antibiotics and IV steroids.  She remained mostly afebrile throughout with minimal symptoms.  A strep urinary antigen and Legionella urinary antigen were negative.  A MRSA nasal swab was negative.  The patient was seen by speech therapy and underwent a modified barium swallow, which showed some aspiration of thin liquids.  A repeat chest  x-ray 2 days after her CAT scan showed minimal residual right pleural effusion with improving infiltrates.  She completed an empiric 7-day course of Zosyn for possible aspiration pneumonia today.  She has a few more days of steroids for possible pulmonary reaction to her COVID-19.     As mentioned above, on her CT scan abdomen, the patient had a distended bladder.  Foley catheter was placed.  A voiding trial last week was unsuccessful, Flomax was added to her medical regimen.  She had her Foley discontinued yesterday and has some incontinence of urine, but is emptying her bladder at the moment.      The patient is tentatively going to be discharged on the following medications: Decadron 6 mg daily through 11/14/2020, Lasix 20 mg q.a.m., Plaquenil 200 mg daily, lactulose 20 g p.o. b.i.d., Synthroid 125 mcg daily (100 mcg prior to admission), lisinopril 10 mg daily (40 mg daily prior to admission),  Lopressor 25 mg q.8 hours, Xifaxan 550 mg b.i.d., Flomax 0.4 mg daily, Tylenol p.r.n., DuoNeb nebulizer treatment q.4 hours p.r.n., Robitussin-DM p.o. p.r.n.     It is suggested that the patient have potassium and magnesium levels followed somewhat regularly.  She will need referral to Dr. Juel Burrow, Dr. Rosebud Poles, et al., for consideration of repair of her infraumbilical hernia. In regards to possible Lewy Body dementia, follow up with neurology is recommended.  It should be noted that the patient was maintained on at least DVT prophylaxis dose of Lovenox throughout the hospitalization and for a significant period of time, full-dose anticoagulation with her new onset atrial fibrillation.      ___________________  [name] DO   Dictated WE:[id number] L. Kael Keetch, DO  SB  D: 11/12/2020 10:33:00  T: 11/12/2020 11:35:54  [id number]

## 2020-11-13 LAB — GLUCOSE, POC
Glucose (POC): 172 mg/dL — ABNORMAL HIGH (ref 65–105)
Glucose (POC): 123 mg/dL — ABNORMAL HIGH (ref 65–105)
Glucose (POC): 96 mg/dL (ref 65–105)

## 2020-11-13 LAB — POCT GLUCOSE
POC Glucose: 172 mg/dL — ABNORMAL HIGH (ref 65–105)
POC Glucose: 123 mg/dL — ABNORMAL HIGH (ref 65–105)
POC Glucose: 96 mg/dL (ref 65–105)

## 2020-11-13 MED FILL — XIFAXAN 550 MG TABLET: 550 mg | ORAL | Qty: 1

## 2020-11-13 MED FILL — METOPROLOL TARTRATE 25 MG TAB: 25 mg | ORAL | Qty: 1

## 2020-11-13 MED FILL — HYDROXYCHLOROQUINE 200 MG TAB: 200 mg | ORAL | Qty: 1

## 2020-11-13 MED FILL — DEXAMETHASONE 4 MG TAB: 4 mg | ORAL | Qty: 2

## 2020-11-13 MED FILL — LISINOPRIL 5 MG TAB: 5 mg | ORAL | Qty: 2

## 2020-11-13 MED FILL — FUROSEMIDE 20 MG TAB: 20 mg | ORAL | Qty: 1

## 2020-11-13 MED FILL — ENOXAPARIN 40 MG/0.4 ML SUB-Q SYRINGE: 40 mg/0.4 mL | SUBCUTANEOUS | Qty: 0.4

## 2020-11-13 MED FILL — TAMSULOSIN SR 0.4 MG 24 HR CAP: 0.4 mg | ORAL | Qty: 1

## 2020-11-13 MED FILL — LEVOTHYROXINE 125 MCG TAB: 125 mcg | ORAL | Qty: 1

## 2020-11-13 MED FILL — LACTULOSE 20 GRAM/30 ML ORAL SOLUTION: 20 gram/30 mL | ORAL | Qty: 30

## 2020-11-13 NOTE — Progress Notes (Signed)
 Progress Notes by Eric Breck DEL, RN at 11/13/20 1254                Author: Eric Breck DEL, RN  Service: CASE MANAGEMENT  Author Type: Care Management       Filed: 11/13/20 1257  Date of Service: 11/13/20 1254  Status: Signed          Editor: Eric Breck DEL, RN (Care Management)                    Medical Necessity Certification Statement for Non-Emergency Ambulance Services         SECTION 1 - GENERAL INFORMATION          Patient: [name] [name]  Age: 87 y.o.  Sex: female          Date of Birth: 1933/07/24  Admit Date: 10/21/2020  PCP: Haydee Debby CROME, MD         MRN: [id number]   CSN: [id number]   Room: 2215/2215        Chief Complaint:      Chief Complaint       Patient presents with        ?  Fall        ?  Hip Pain         Height/Weight:  5'6 - 160 lbs.  Body mass index is 25.98 kg/m. Body mass index is  25.98 kg/m.   Weight:     Weight: 73 kg (160 lb 15 oz)   Weight Source: Weight Source: Bed scale      Transport From:  [x]  Hospital    [] SNF   [] Residence   []  Other:    Transport To:      []  Hospital    [x] SNF   [] Residence   []  Other:    Address:    [facility], [address], [place], [place]  [address]      Medicare:      Insurance Information                                       [place] MEDICARE/[facility] MEDICARE A & B  Phone:  --             Subscriber:  Wealthy, Danielski  Subscriber#:  [id number]       Group#:  --  Precert#:  --                      AARP/[facility] AARP SUPPLEMENTAL  Phone:  --             Subscriber:  Zelena, Bushong  Subscriber#:  [id number]             Group#:  PLAN [id number]  Precert#:  --                   Transport Date: November 13, 2020 (Valid for round trips this date, or for scheduled repetitive trips for 60 days from date signed below.)   Origin: [facility]   Destination: Rehab Facility @PATIENTADDRESS @      Is the Patient's stay covered under Medicare Part A (PPS/DRG?)  [x]   Yes    []  No      Closest appropriate facility?  [x]  Yes    []  No     If no, why was the  patient transported to  another facility?   N/A   If hospital to hospital transfer, describe services needed at 2nd facility not available at 1st facility:   N/A   If hospice Pt, is this transport related to Pt's terminal illness? []   Yes    [x]  No   Describe:   N/A      SECTION II - MEDICAL NECESSITY QUESTIONNAIRE   Ambulance transportation is medically necessary only if other means of transport are contraindicated or would be potentially    Harmful to the patient. To meet this requirement, the patient must be either bed confined or suffer from condition such    that transport by means other than an ambulance is contraindicated by the patient's condition.The following questions    must be addressed by the healthcare professional signing below for this form to be valid:      1) Describe the MEDICAL CONDITION space (physical and/or mental) of this patient AT THE TIME OF AMBULANCE TRANSPORT        that requires the patient to be transported in an ambulance, and why transport in an ambulance, and why transport by        other means is contraindicated by the patient's condition:   Recovering COVID pneumonitis, acute CHF, cirrhosis, rheumatoid arthriits, and possible concurrent Lewy Body Dementia.      2) Is this patient bed confined as defined below?  [x]   Yes    []  No            To be bed confined the patient must satisfy all 3 of the following criteria:            (1) unable to get up from bed without assistance; AND            (2) unable to ambulate; AND            (3) unable to sit in a chair or wheelchair.      3) Can this patient safely be transported by car or wheelchair van (I.e., may safely sit during transport,        without an attendant or monitoring?)      []  Yes     [x]  No      4) In addition to completing questions 1-3 above, please check any of the following conditions that apply*:        *Note: Supporting documentation for any boxes checked must be maintained in the patient's medical records       [] Contractures    [] Non-healed fractures    [x] Patient is confused     [] Patient is comatose     [] Moderate/severe pain on movement    [] Danger to self/others    [] IV meds/fluids required   [] PIV:   []  Yes    []   No   [] Patient is combative  [] Need, or possible need, for restraints    [] DVT requires elevation of a lower extremity    [x] Medical attendant required    [] Requires oxygen - unable to self-administer    [] Special handling/isolation/infection control precautions  required   [x] Unable to tolerate seated position for time needed  to transport    [] Hemodynamic monitoring required enroute     [] Unable to sit in a chair or wheelchair due to decubitus  ulcers or other wounds    [] Cardiac monitoring required enroute    [] Morbid obesity requires additional personnel/equipment  to safely handle patient     [] Orthopedic device (backboard, halo, pins, traction,  brace, wedge, etc.) requiring  special handling during transport    [x] Other (specificy)   Moderate to maximum assist with  transfer      SECTION III - SIGNATURE OF PHYSICIAN OR OTHER AUTHORIZED HEALTHCARE PROFESSIONAL   I certify that the above information is accurate based on my evaluation of this patient, and that the medical necessity provisions of 42 CFR 410.40 (e)(1) are met, requiring that this patient be transported  by ambulance.  I understand this information will be used by the centers for Medicare and Medicaid Services (CMS) to support the determination of medical necessity for ambulance services.  I represent that I am the beneficiary's attending physician; or  an employee of the beneficiary's attending physician, or the hospital or facility where the beneficiary is being treated and from which the beneficiary is being transported; that I have personal knowledge of the beneficiary's condition at the time of  transport; and that I meet all Medicare regulations and applicable state licensure laws for the credentialed indicated.      [x] If this box   is checked, I also certify that the patient is physically or mentally incapable of signing the ambulance services claim form and that the institution with which I am affiliated has furnished care, services or assistance to the patient.  My signature  below was made on behalf of the patient pursuant to 80 RQM575.63(a)(5).  In accordance with  42 [id number], the specific reason(s) that the patient is physically or mentally incapable of signing the claim form is as follows.        PATIENT DATA:     ED Attending: Haydee Debby CROME, MD       Visit Vitals      BP  133/73 (BP 1 Location: Right upper arm, BP Patient Position: Supine)     Pulse  (!) 53     Temp  97.7 F (36.5 C)     Resp  16     Ht  5' 6 (1.676 m)     Wt  73 kg (160 lb 15 oz)     SpO2  96%     Breastfeeding  No        BMI  25.98 kg/m              Past Medical History:        Diagnosis  Date         ?  Arthritis       ?  Deep venous thrombosis (HCC)       ?  Endocrine disease            hypothyroid         ?  Hemochromatosis           ?  Hypertension            Medications          Medications       sodium chloride (NS) flush 5-10 mL (has no administration in time range)     furosemide (LASIX) tablet 40 mg ( Oral Automatically Held 11/19/20 0900)     magnesium oxide (MAG-OX) tablet 400 mg ( Oral Automatically Held 11/19/20 0900)     melatonin tablet 3 mg ( Oral Automatically Held 11/18/20 2200)     potassium chloride (K-DUR, KLOR-CON M20) SR tablet 40 mEq ( Oral Automatically Held 11/19/20 0900)     calcium-vitamin D (OS-CAL +D3) 500 mg-200 unit per tablet 1 Tablet ( Oral Automatically Held 11/19/20 1200)  hydrOXYchloroQUINE (PLAQUENIL) tablet 200 mg (200 mg Oral Given 11/13/20 1001)     folic acid (FOLVITE) tablet 1 mg ( Oral Automatically Held 11/19/20 0900)     naloxone (NARCAN) injection 0.1 mg (has no administration in time range)     acetaminophen (TYLENOL) tablet 650 mg (650 mg Oral Given 11/06/20 2105)       Or     acetaminophen (TYLENOL) solution 650 mg (  Oral See Alternative 11/06/20 2105)       Or     acetaminophen (TYLENOL) suppository 650 mg ( Rectal See Alternative 11/06/20 2105)       HYDROcodone-acetaminophen (NORCO) 5-325 mg per tablet 1 Tablet ( Oral Held by provider 10/24/20 1112)       ondansetron (ZOFRAN) injection 4 mg (4 mg IntraVENous Given 11/07/20 2100)     lisinopriL (PRINIVIL, ZESTRIL) tablet 40 mg ( Oral Automatically Held 11/22/20 0900)     rifAXIMin (XIFAXAN) tablet 550 mg (550 mg Oral Given 11/13/20 1000)     cephALEXin (KEFLEX) capsule 500 mg (0 mg Oral Held 10/28/20 0600)     metoprolol tartrate (LOPRESSOR) tablet 25 mg (25 mg Oral Given 11/13/20 0728)     lactulose (CHRONULAC) 10 gram/15 mL solution 30 mL (30 mL Oral Refused 11/13/20 0958)     guaiFENesin-dextromethorphan (ROBITUSSIN DM) 100-10 mg/5 mL syrup 10 mL (10 mL Oral Given 11/08/20 2134)     levothyroxine (SYNTHROID) tablet 125 mcg (125 mcg Oral Given 11/13/20 0728)     tamsulosin (FLOMAX) capsule 0.4 mg (0.4 mg Oral Given 11/13/20 1000)     enoxaparin (LOVENOX) injection 40 mg (40 mg SubCUTAneous Given 11/13/20 0958)     furosemide (LASIX) tablet 20 mg (20 mg Oral Given 11/13/20 0959)     lisinopriL (PRINIVIL, ZESTRIL) tablet 10 mg (10 mg Oral Given 11/13/20 1001)     albuterol-ipratropium (DUO-NEB) 2.5 MG-0.5 MG/3 ML (has no administration in time range)     dexAMETHasone (DECADRON) tablet 6 mg (6 mg Oral Given 11/13/20 0959)     potassium chloride (K-DUR, KLOR-CON M20) SR tablet 40 mEq (40 mEq Oral Given 10/21/20 2110)     potassium chloride (K-DUR, KLOR-CON M20) SR tablet 40 mEq (40 mEq Oral Given 10/23/20 1149)     magnesium sulfate 3 g in 0.9% sodium chloride 100 mL IVPB (3 g IntraVENous New Bag 10/23/20 1442)     lisinopriL (PRINIVIL, ZESTRIL) tablet 20 mg (20 mg Oral Given 10/24/20 1119)     magnesium sulfate 2 g/50 ml IVPB (premix or compounded) (2 g IntraVENous New Bag 10/25/20 1151)     potassium chloride (K-DUR, KLOR-CON M20) SR tablet 40 mEq (40 mEq Oral Given 10/25/20 1152)     polyethylene  glycol (MIRALAX) packet 17 g (17 g Oral Given 10/27/20 1817)     glycerin (adult) suppository 1 Suppository (1 Suppository Rectal Given 10/28/20 1248)     iopamidoL (ISOVUE 300) 61 % contrast injection 80 mL (80 mL IntraVENous Given 10/30/20 1950)     magnesium sulfate 2 g/50 ml IVPB (premix or compounded) (2 g IntraVENous New Bag 10/31/20 1128)     amiodarone (CORDARONE) tablet 400 mg (400 mg Oral Given 11/03/20 0649)     potassium chloride (K-DUR, KLOR-CON M20) SR tablet 40 mEq (40 mEq Oral Given 11/02/20 1357)     magnesium oxide (MAG-OX) tablet 400 mg (400 mg Oral Given 11/02/20 1356)     iopamidoL (ISOVUE 300) 61 % contrast injection 80 mL (80 mL IntraVENous Given 11/04/20  1200)     magnesium sulfate 2 g/50 ml IVPB (premix or compounded) (2 g IntraVENous New Bag 11/05/20 1215)       potassium chloride (K-DUR, KLOR-CON M20) SR tablet 40 mEq (40 mEq Oral Given 11/05/20 1214)       piperacillin-tazobactam (ZOSYN) 3.375 g in 0.9% sodium chloride (MBP/ADV) 100 mL MBP (0 g IntraVENous Stopped 11/12/20 2216)     piperacillin-tazobactam (ZOSYN) 4.5 g in 0.9% sodium chloride (MBP/ADV) 100 mL MBP (4.5 g IntraVENous New Bag 11/05/20 1216)     vancomycin (VANCOCIN) 1500 mg in NS 500 ml infusion (1,500 mg IntraVENous New Bag 11/05/20 1436)     magnesium sulfate 2 g/50 ml IVPB (premix or compounded) (2 g IntraVENous New Bag 11/07/20 1105)     potassium chloride (K-DUR, KLOR-CON M20) SR tablet 40 mEq (40 mEq Oral Given 11/07/20 1106)     furosemide (LASIX) injection 20 mg (20 mg IntraVENous Given 11/07/20 1106)     furosemide (LASIX) injection 20 mg (20 mg IntraVENous Given 11/07/20 2059)     methylPREDNISolone (PF) (SOLU-MEDROL) injection 20 mg (20 mg IntraVENous Given 11/11/20 2145)     barium sulfate (VARIBAR NECTAR) 40 % (w/v) contrast suspension 15 mL (15 mL Oral Given 11/07/20 1500)     barium sulfate (VARIBAR PUDDING) 40 % (w/v), 30% (w/w) contrast oral paste 15 mL (15 mL Oral Given 11/07/20 1500)     barium sulfate (VARIBAR THIN) 81 %  (w/w) oral powder 30 mL (30 mL Oral Given 11/07/20 1500)     potassium chloride (K-DUR, KLOR-CON M20) SR tablet 40 mEq (40 mEq Oral Given 11/08/20 1022)       potassium chloride SR (KLOR-CON 10) tablet 40 mEq (40 mEq Oral Given 11/08/20 2133)             Allergies          Allergies        Allergen  Reactions         ?  Shellfish Derived  Hives         ?  Sulfa (Sulfonamide Antibiotics)  Hives             ED Course/Discharge                   ICD-10-CM  ICD-9-CM          1.  Metabolic encephalopathy   G93.41  348.31     2.  Multiple falls   R29.6  V15.88     3.  Contusion of left elbow, initial encounter   S50.02XA  923.11          4.  Contusion of left hip, initial encounter   S70.02XA  924.01        Rehab Facility      Current Discharge Medication List                 START taking these medications          Details        dexAMETHasone (DECADRON) 4 mg tablet  Start date: 11/14/2020               rifAXIMin (XIFAXAN) 550 mg tablet  Take 1 Tablet by mouth two (2) times a day.   Start date: 11/13/2020               tamsulosin (FLOMAX) 0.4 mg capsule  Take 1 Capsule by mouth daily.   Start date: 11/14/2020  CONTINUE these medications which have CHANGED          Details        furosemide (LASIX) 20 mg tablet  Take 1 Tablet by mouth daily.   Start date: 11/13/2020               lactulose (CHRONULAC) 10 gram/15 mL solution  Take 30 mL by mouth two (2) times a day.   Start date: 11/13/2020               melatonin 3 mg tablet  Take 1 Tablet by mouth nightly as needed for Insomnia.   Start date: 11/13/2020               metoprolol tartrate (LOPRESSOR) 25 mg tablet  Take 1 Tablet by mouth every eight (8) hours.   Start date: 11/13/2020               potassium chloride (K-DUR, KLOR-CON M20) 20 mEq tablet  Take 2 Tablets by mouth daily.   Start date: 11/13/2020                        CONTINUE these medications which have NOT CHANGED          Details        magnesium oxide (MAG-OX) 400 mg tablet  Take 400 mg by mouth in  the morning.               albuterol-ipratropium (DUO-NEB) 2.5 mg-0.5 mg/3 ml nebu  3 mL by Nebulization route every four (4) hours as needed (sob).   Qty: 30 Nebule, Refills: 1               hydroxychloroquine (PLAQUENIL) 200 mg tablet  Take 200 mg by mouth two (2) times a day.               lisinopril (PRINIVIL, ZESTRIL) 10 mg tablet  Take 10 mg by mouth daily.               levothyroxine (SYNTHROID) 125 mcg tablet  Take 125 mcg by mouth Daily (before breakfast).                        STOP taking these medications                  alendronate (FOSAMAX) 70 mg tablet  Comments:    Reason for Stopping:                      methotrexate (RHEUMATREX) 2.5 mg tablet  Comments:    Reason for Stopping:                      folic acid (FOLVITE) 1 mg tablet  Comments:    Reason for Stopping:                      potassium chloride SR (KLOR-CON 10) 10 mEq tablet  Comments:    Reason for Stopping:                      calcium-cholecalciferol, d3, (CALCIUM 600 + D) 600-125 mg-unit tab  Comments:    Reason for Stopping:                      predniSONE (DELTASONE) 10 mg tablet  Comments:  Reason for Stopping:                                  Electronically signed by:   Breck VEAR Glisson, RN   November 13, 2020   12:54 PM         Name: __________________________________________________________________   Printed Name and Credentials Authorized Healthcare Professional (MD, DO, RN, etc)   *Form must be signed only by patient's attending physician for scheduled, repetitive transports.   For non-repetitive ambulance transports, if unable to obtain the signature of the attending physician,   Any of the following may sign (please check appropriate box below):         [] Physician Assistant   [] Clinical Nurse Specialist  []  Case Manager  [] Social Worker    [] Nurse Practitioner     [] Registered Nurse             []  Discharge Planner         Dispatch Phone: [phone]   Dispatch Fax:     [phone]

## 2020-11-13 NOTE — Progress Notes (Signed)
Transport to: [facility].   Reason for transport: Contusion to left hip & elbow, unable to ambulate/weight shift, recovering COVID pneumonitis, acute CHF, cirrhosis, rheumatoid arthritis, and possible concurrent Lewy Body Dementia.  Transport set up with: [facility]  Time/Date: 11/13/2020 @ 1630  D/C Summary loaded: Yes  Nurse/CM notified: Yes  Envelope delivered: Yes  Insurance verified on face sheet: Yes  Auth needed: No  Auth #:

## 2020-11-13 NOTE — Progress Notes (Signed)
Patient in good condition, getting discharge to [facility], called report to [facility].

## 2020-11-13 NOTE — Progress Notes (Signed)
Patient discharged to rehab [facility]).  Patient, family, and facility notified.  Medical transport tentatively set for 1500.        Discharge Plan:   Rehab    Discharge Date:     11/13/2020     Assisted Living Facility:    N/A    Is patient going to snf?:  Yes  Name:  [facility]    Does patient meet [employer] qualifications or exemptions:  Yes    Exempt r/t over resource or denied:  N/A    Is UAI complete:  Yes  By who:  [employer]    Home Health Needed:     No    DME needed and ordered for Discharge:    None     TCC Referral:     No    Medication Assistance given    No       meds given (please list)     None    Change(MDC/SSDI) Referral/outcome     N/A     Transportation: IT sales professional    Transport Time:    1500    Family, MD, patient, nurse and facility aware of pickup time    Yes

## 2020-11-13 NOTE — Progress Notes (Signed)
LTSS signed by Dr. Emmit Alexanders and scanned in [facility]

## 2020-11-13 NOTE — Progress Notes (Signed)
Patient in good condition, IV removed and intact, [facility] transport given report about patient and given patient belongings. Patient went down via stretcher and [facility] ems to [facility].

## 2020-11-13 NOTE — Discharge Summary (Signed)
[facility]  Transfer Summary  NAME:  [name] [name], [name] [name]  SEX:   F  ADMIT: 10/21/2020  DISCH:   DOB: [birth year]  MR#    [id number]  ACCT#  [id number]    cc: Maisie Fus Renell Allum DO      ADDENDUM TO TRANSFER SUMMARY BY DR. Caralyn Guile:     DATE OF ADMISSION:  10/21/2020     TENTATIVE DATE OF TRANSFER:   11/13/2020     Please see transfer summary dictated 11/12/2020.     HOSPITAL COURSE:   The patient's condition has remained stable over the last 24 hours.  No new additional complaints.  There have been no changes in her discharge medications.        I am going to resume the patient's potassium chloride 40 mEq p.o. daily and her mag oxide 400 mg p.o. daily as well as her melatonin 3 mg p.o. at bedtime p.r.n. insomnia.  Again, the patient has had frequent need of replacement of both magnesium and potassium and this should probably be monitored somewhat closely.     Additionally, the patient is urinating well since discontinuation of her Foley catheter 2 days ago.  Consideration could be given to potentially discontinuing her Flomax after a number of days.      ___________________  [name] DO   Dictated [id number] L. Nellie Pester, DO  SB  D: 11/13/2020 10:06:57  T: 11/13/2020 10:22:12  [id number]

## 2021-02-06 ENCOUNTER — Emergency Department: Admit: 2021-02-06 | Payer: MEDICARE | Primary: Internal Medicine

## 2021-02-06 ENCOUNTER — Inpatient Hospital Stay
Admit: 2021-02-06 | Discharge: 2021-02-19 | Disposition: A | Discharge status: Skilled Nursing Facility | Payer: MEDICARE | Attending: Emergency Medicine | Admitting: Emergency Medicine

## 2021-02-06 ENCOUNTER — Emergency Department: Payer: MEDICARE | Primary: Internal Medicine

## 2021-02-06 DIAGNOSIS — I63511 Cerebral infarction due to unspecified occlusion or stenosis of right middle cerebral artery: Secondary | ICD-10-CM

## 2021-02-06 DIAGNOSIS — I63311 Cerebral infarction due to thrombosis of right middle cerebral artery: Principal | ICD-10-CM

## 2021-02-06 LAB — CBC WITH AUTOMATED DIFF
BASOPHILS: 0.3 % (ref 0–3)
EOSINOPHILS: 0.3 % (ref 0–5)
HCT: 36.4 % — ABNORMAL LOW (ref 37.0–50.0)
HGB: 12.2 gm/dl — ABNORMAL LOW (ref 13.0–17.2)
IMMATURE GRANULOCYTES: 0.5 % (ref 0.0–3.0)
LYMPHOCYTES: 16.3 % — ABNORMAL LOW (ref 28–48)
MCH: 31 pg (ref 25.4–34.6)
MCHC: 33.5 gm/dl (ref 30.0–36.0)
MCV: 92.4 fL (ref 80.0–98.0)
MONOCYTES: 9.4 % (ref 1–13)
MPV: 10.8 fL — ABNORMAL HIGH (ref 6.0–10.0)
NEUTROPHILS: 73.2 % — ABNORMAL HIGH (ref 34–64)
NRBC: 0 (ref 0–0)
PLATELET: 191 10*3/uL (ref 140–450)
RBC: 3.94 M/uL (ref 3.60–5.20)
RDW-SD: 44.3 (ref 36.4–46.3)
WBC: 8.8 10*3/uL (ref 4.0–11.0)

## 2021-02-06 LAB — METABOLIC PANEL, BASIC
Anion gap: 12 mmol/L (ref 5–15)
BUN: 12 mg/dl (ref 9–23)
CO2: 21 mEq/L (ref 20–31)
Calcium: 9.4 mg/dl (ref 8.7–10.4)
Chloride: 106 mEq/L (ref 98–107)
Creatinine: 0.97 mg/dl (ref 0.55–1.02)
GFR est AA: >60
GFR est non-AA: 58
Glucose: 180 mg/dl — ABNORMAL HIGH (ref 74–106)
Potassium: 3.7 mEq/L (ref 3.5–5.1)
Sodium: 139 mEq/L (ref 136–145)

## 2021-02-06 LAB — PLATELET FUNCTION, VERIFY NOW P2Y12
P2Y12 ASSAY: 235 [PRU] (ref 194–418)
P2Y12 Assay: 235 [PRU] (ref 194–418)

## 2021-02-06 LAB — METABOLIC PANEL, COMPREHENSIVE
ALT (SGPT): 9 U/L — ABNORMAL LOW (ref 10–49)
AST (SGOT): 21 U/L (ref 0.0–33.9)
Albumin: 3 gm/dl — ABNORMAL LOW (ref 3.4–5.0)
Alk. phosphatase: 106 U/L (ref 46–116)
Anion gap: 10 mmol/L (ref 5–15)
BUN: 12 mg/dl (ref 9–23)
Bilirubin, total: 0.8 mg/dl (ref 0.30–1.20)
CO2: 22 mEq/L (ref 20–31)
Calcium: 9.2 mg/dl (ref 8.7–10.4)
Chloride: 108 mEq/L — ABNORMAL HIGH (ref 98–107)
Creatinine: 0.88 mg/dl (ref 0.55–1.02)
GFR est AA: >60
GFR est non-AA: >60
Glucose: 143 mg/dl — ABNORMAL HIGH (ref 74–106)
Potassium: 3.9 mEq/L (ref 3.5–5.1)
Protein, total: 6 gm/dl (ref 5.7–8.2)
Sodium: 139 mEq/L (ref 136–145)

## 2021-02-06 LAB — GLUCOSE, POC
Glucose (POC): 142 mg/dL — ABNORMAL HIGH (ref 65–105)
Glucose (POC): 139 mg/dL — ABNORMAL HIGH (ref 65–105)

## 2021-02-06 LAB — CBC W/O DIFF
HCT: 38.2 % (ref 37.0–50.0)
HGB: 12.8 gm/dl — ABNORMAL LOW (ref 13.0–17.2)
MCH: 31.4 pg (ref 25.4–34.6)
MCHC: 33.5 gm/dl (ref 30.0–36.0)
MCV: 93.6 fL (ref 80.0–98.0)
MPV: 11.2 fL — ABNORMAL HIGH (ref 6.0–10.0)
PLATELET: 191 10*3/uL (ref 140–450)
RBC: 4.08 M/uL (ref 3.60–5.20)
RDW-SD: 44.3 (ref 36.4–46.3)
WBC: 6.7 10*3/uL (ref 4.0–11.0)

## 2021-02-06 LAB — BLOOD TYPE, (ABO+RH)
ABO/Rh(D): O POS
ABO/Rh: O POS

## 2021-02-06 LAB — EKG, 12 LEAD, INITIAL
Calculated R Axis: -73 degrees
Calculated T Axis: 13 degrees
Q-T Interval: 414 ms
QRS Duration: 82 ms
QTC Calculation (Bezet): 468 ms
Ventricular Rate: 77 {beats}/min

## 2021-02-06 LAB — PROTHROMBIN TIME + INR
INR: 1.2 — ABNORMAL HIGH (ref 0.1–1.1)
Prothrombin time: 14.6 seconds — ABNORMAL HIGH (ref 10.2–12.9)

## 2021-02-06 LAB — ANTIBODY SCREEN
Antibody Screen: NEGATIVE
Antibody screen: NEGATIVE

## 2021-02-06 LAB — FIBRINOGEN
Fibrinogen: 337 mg/dl (ref 220–397)
Fibrinogen: 337 mg/dl (ref 220–397)

## 2021-02-06 LAB — ASPIRIN TEST
Aspirin test: 651 {ARU} (ref 620–672)
Aspirn-platlelet function: 651 {ARU} (ref 620–672)

## 2021-02-06 LAB — COMPREHENSIVE METABOLIC PANEL
ALT: 9 U/L — ABNORMAL LOW (ref 10–49)
AST: 21 U/L (ref 0.0–33.9)
Albumin: 3 gm/dl — ABNORMAL LOW (ref 3.4–5.0)
Alkaline Phosphatase: 106 U/L (ref 46–116)
Anion Gap: 10 mmol/L (ref 5–15)
BUN: 12 mg/dl (ref 9–23)
CO2: 22 mEq/L (ref 20–31)
Calcium: 9.2 mg/dl (ref 8.7–10.4)
Chloride: 108 mEq/L — ABNORMAL HIGH (ref 98–107)
Creatinine: 0.88 mg/dl (ref 0.55–1.02)
EGFR IF NonAfrican American: >60
GFR African American: >60
Glucose: 143 mg/dl — ABNORMAL HIGH (ref 74–106)
Potassium: 3.9 mEq/L (ref 3.5–5.1)
Sodium: 139 mEq/L (ref 136–145)
Total Bilirubin: 0.8 mg/dl (ref 0.30–1.20)
Total Protein: 6 gm/dl (ref 5.7–8.2)

## 2021-02-06 LAB — CBC
Hematocrit: 38.2 % (ref 37.0–50.0)
Hemoglobin: 12.8 gm/dl — ABNORMAL LOW (ref 13.0–17.2)
MCH: 31.4 pg (ref 25.4–34.6)
MCHC: 33.5 gm/dl (ref 30.0–36.0)
MCV: 93.6 fL (ref 80.0–98.0)
MPV: 11.2 fL — ABNORMAL HIGH (ref 6.0–10.0)
Platelets: 191 10*3/uL (ref 140–450)
RBC: 4.08 M/uL (ref 3.60–5.20)
RDW-SD: 44.3 (ref 36.4–46.3)
WBC: 6.7 10*3/uL (ref 4.0–11.0)

## 2021-02-06 LAB — BASIC METABOLIC PANEL
Anion Gap: 12 mmol/L (ref 5–15)
BUN: 12 mg/dl (ref 9–23)
CO2: 21 mEq/L (ref 20–31)
Calcium: 9.4 mg/dl (ref 8.7–10.4)
Chloride: 106 mEq/L (ref 98–107)
Creatinine: 0.97 mg/dl (ref 0.55–1.02)
EGFR IF NonAfrican American: 58
GFR African American: >60
Glucose: 180 mg/dl — ABNORMAL HIGH (ref 74–106)
Potassium: 3.7 mEq/L (ref 3.5–5.1)
Sodium: 139 mEq/L (ref 136–145)

## 2021-02-06 LAB — POCT GLUCOSE
POC Glucose: 142 mg/dL — ABNORMAL HIGH (ref 65–105)
POC Glucose: 139 mg/dL — ABNORMAL HIGH (ref 65–105)

## 2021-02-06 LAB — EKG 12-LEAD
Q-T Interval: 414 ms
QRS Duration: 82 ms
QTc Calculation (Bazett): 468 ms
R Axis: -73 degrees
T Axis: 13 degrees
Ventricular Rate: 77 {beats}/min

## 2021-02-06 LAB — CBC WITH AUTO DIFFERENTIAL
Basophils %: 0.3 % (ref 0–3)
Eosinophils %: 0.3 % (ref 0–5)
Hematocrit: 36.4 % — ABNORMAL LOW (ref 37.0–50.0)
Hemoglobin: 12.2 gm/dl — ABNORMAL LOW (ref 13.0–17.2)
Immature Granulocytes: 0.5 % (ref 0.0–3.0)
Lymphocytes %: 16.3 % — ABNORMAL LOW (ref 28–48)
MCH: 31 pg (ref 25.4–34.6)
MCHC: 33.5 gm/dl (ref 30.0–36.0)
MCV: 92.4 fL (ref 80.0–98.0)
MPV: 10.8 fL — ABNORMAL HIGH (ref 6.0–10.0)
Monocytes %: 9.4 % (ref 1–13)
Neutrophils %: 73.2 % — ABNORMAL HIGH (ref 34–64)
Nucleated RBCs: 0 (ref 0–0)
Platelets: 191 10*3/uL (ref 140–450)
RBC: 3.94 M/uL (ref 3.60–5.20)
RDW-SD: 44.3 (ref 36.4–46.3)
WBC: 8.8 10*3/uL (ref 4.0–11.0)

## 2021-02-06 LAB — PROTIME-INR
INR: 1.2 — ABNORMAL HIGH (ref 0.1–1.1)
Protime: 14.6 seconds — ABNORMAL HIGH (ref 10.2–12.9)

## 2021-02-06 MED ORDER — ACETAMINOPHEN 325 MG TABLET
325 mg | Freq: Four times a day (QID) | ORAL | Status: DC | PRN
Start: 2021-02-06 — End: 2021-02-06

## 2021-02-06 MED ORDER — SODIUM CHLORIDE 0.9 % IV
2510 mg/10 mL | INTRAVENOUS | Status: AC
Start: 2021-02-06 — End: 2021-02-08

## 2021-02-06 MED ORDER — ONDANSETRON (PF) 4 MG/2 ML INJECTION
4 mg/2 mL | Freq: Four times a day (QID) | INTRAMUSCULAR | Status: AC | PRN
Start: 2021-02-06 — End: 2021-02-10
  Administered 2021-02-10: 14:00:00 via INTRAVENOUS

## 2021-02-06 MED ORDER — METOPROLOL TARTRATE 25 MG TAB
25 mg | Freq: Three times a day (TID) | ORAL | Status: DC
Start: 2021-02-06 — End: 2021-02-19

## 2021-02-06 MED ORDER — SODIUM CHLORIDE 0.9% BOLUS IV
0.9 % | Freq: Once | INTRAVENOUS | Status: AC
Start: 2021-02-06 — End: 2021-02-06
  Administered 2021-02-06: 14:00:00 via INTRAVENOUS

## 2021-02-06 MED ORDER — LISINOPRIL 5 MG TAB
5 mg | Freq: Every day | ORAL | Status: AC
Start: 2021-02-06 — End: 2021-02-19

## 2021-02-06 MED ORDER — ACETAMINOPHEN 1,000 MG/100 ML (10 MG/ML) IV
1000 mg/100 mL (10 mg/mL) | Freq: Four times a day (QID) | INTRAVENOUS | Status: DC | PRN
Start: 2021-02-06 — End: 2021-02-06

## 2021-02-06 MED ORDER — IODIXANOL 270 MG/ML IV SOLN
270 mg iodine/mL | INTRAVENOUS | Status: DC | PRN
Start: 2021-02-06 — End: 2021-02-06
  Administered 2021-02-06: 17:00:00 via INTRA_ARTERIAL

## 2021-02-06 MED ORDER — POTASSIUM CHLORIDE SR 20 MEQ TAB, PARTICLES/CRYSTALS
20 mEq | Freq: Every day | ORAL | Status: DC
Start: 2021-02-06 — End: 2021-02-19

## 2021-02-06 MED ORDER — ASPIRIN 325 MG TAB
325 mg | Freq: Every day | ORAL | Status: DC
Start: 2021-02-06 — End: 2021-02-06

## 2021-02-06 MED ORDER — FENTANYL CITRATE (PF) 50 MCG/ML IJ SOLN
50 mcg/mL | INTRAMUSCULAR | Status: DC | PRN
Start: 2021-02-06 — End: 2021-02-06
  Administered 2021-02-06 (×8): via INTRAVENOUS

## 2021-02-06 MED ORDER — RIFAXIMIN 550 MG TAB
550 mg | Freq: Two times a day (BID) | ORAL | Status: AC
Start: 2021-02-06 — End: 2021-02-11
  Administered 2021-02-11 (×2): via ORAL

## 2021-02-06 MED ORDER — FUROSEMIDE 20 MG TAB
20 mg | Freq: Every day | ORAL | Status: DC
Start: 2021-02-06 — End: 2021-02-19

## 2021-02-06 MED ORDER — FENTANYL CITRATE (PF) 50 MCG/ML IJ SOLN
50 mcg/mL | INTRAMUSCULAR | Status: AC
Start: 2021-02-06 — End: ?

## 2021-02-06 MED ORDER — PHENYLEPHRINE 10 MG/ML INJECTION
10 mg/mL | INTRAMUSCULAR | Status: DC | PRN
Start: 2021-02-06 — End: 2021-02-06
  Administered 2021-02-06 (×2): via INTRAVENOUS

## 2021-02-06 MED ORDER — ELECTROLYTE REPLACEMENT PROTOCOL
Status: AC | PRN
Start: 2021-02-06 — End: 2021-02-09

## 2021-02-06 MED ORDER — IOPAMIDOL 76 % IV SOLN
76 % | Freq: Once | INTRAVENOUS | Status: AC
Start: 2021-02-06 — End: 2021-02-06
  Administered 2021-02-06: 20:00:00 via INTRAVENOUS

## 2021-02-06 MED ORDER — SODIUM CHLORIDE 0.9 % IV
INTRAVENOUS | Status: DC | PRN
Start: 2021-02-06 — End: 2021-02-06
  Administered 2021-02-06: 14:00:00 via INTRAVENOUS

## 2021-02-06 MED ORDER — LIDOCAINE HCL 2 % (20 MG/ML) IJ SOLN
20 mg/mL (2 %) | INTRAMUSCULAR | Status: DC | PRN
Start: 2021-02-06 — End: 2021-02-06
  Administered 2021-02-06: 15:00:00 via INTRADERMAL

## 2021-02-06 MED ORDER — ASPIRIN 325 MG TAB
325 mg | ORAL | Status: DC
Start: 2021-02-06 — End: 2021-02-06

## 2021-02-06 MED ORDER — LEVOTHYROXINE 125 MCG TAB
125 mcg | Freq: Every day | ORAL | Status: AC
Start: 2021-02-06 — End: 2021-02-19
  Administered 2021-02-11 – 2021-02-19 (×9): via ORAL

## 2021-02-06 MED ORDER — HYDROXYCHLOROQUINE 200 MG TAB
200 mg | Freq: Two times a day (BID) | ORAL | Status: AC
Start: 2021-02-06 — End: 2021-02-19
  Administered 2021-02-11 – 2021-02-19 (×19): via ORAL

## 2021-02-06 MED ORDER — IOPAMIDOL 76 % IV SOLN
370 mg iodine /mL (76 %) | Freq: Once | INTRAVENOUS | Status: AC
Start: 2021-02-06 — End: 2021-02-06
  Administered 2021-02-06: 09:00:00 via INTRAVENOUS

## 2021-02-06 MED ORDER — ASPIRIN 300 MG RECTAL SUPPOSITORY
300 mg | Freq: Every day | RECTAL | Status: DC
Start: 2021-02-06 — End: 2021-02-07
  Administered 2021-02-06 – 2021-02-07 (×2): via RECTAL

## 2021-02-06 MED ORDER — SODIUM CHLORIDE 0.9 % IV
10 mg/mL | INTRAVENOUS | Status: DC | PRN
Start: 2021-02-06 — End: 2021-02-06
  Administered 2021-02-06: 15:00:00 via INTRAVENOUS

## 2021-02-06 MED ORDER — ASPIRIN 325 MG TAB
325 mg | Freq: Every day | ORAL | Status: DC
Start: 2021-02-06 — End: 2021-02-06
  Administered 2021-02-06: 21:00:00 via NASOGASTRIC

## 2021-02-06 MED ORDER — HEPARIN (PORCINE) 1,000 UNIT/ML IJ SOLN
1000 unit/mL | INTRAMUSCULAR | Status: DC | PRN
Start: 2021-02-06 — End: 2021-02-06
  Administered 2021-02-06 (×2)

## 2021-02-06 MED ORDER — NOREPINEPHRINE BITARTRATE 8 MG/250 ML (32 MCG/ML) IN 0.9 % NACL IV
825032 mg/250 mL (32 mcg/mL) | INTRAVENOUS | Status: DC
Start: 2021-02-06 — End: 2021-02-08
  Administered 2021-02-06 – 2021-02-07 (×4): via INTRAVENOUS

## 2021-02-06 MED ORDER — LACTULOSE 10 GRAM/15 ML ORAL SOLUTION
10 gram/15 mL | Freq: Two times a day (BID) | ORAL | Status: AC
Start: 2021-02-06 — End: 2021-02-17

## 2021-02-06 MED ORDER — ELECTROLYTE REPLACEMENT PROTOCOL
Status: DC | PRN
Start: 2021-02-06 — End: 2021-02-09

## 2021-02-06 MED ORDER — SODIUM CHLORIDE 0.9% BOLUS IV
0.9 % | Freq: Once | INTRAVENOUS | Status: DC
Start: 2021-02-06 — End: 2021-02-06
  Administered 2021-02-06: 14:00:00 via INTRAVENOUS

## 2021-02-06 MED ORDER — ACETAMINOPHEN 1,000 MG/100 ML (10 MG/ML) IV
1000 mg/100 mL (10 mg/mL) | Freq: Three times a day (TID) | INTRAVENOUS | Status: AC
Start: 2021-02-06 — End: 2021-02-07
  Administered 2021-02-06 – 2021-02-07 (×3): via INTRAVENOUS

## 2021-02-06 MED ORDER — IPRATROPIUM-ALBUTEROL 2.5 MG-0.5 MG/3 ML NEB SOLUTION
2.5 mg-0.5 mg/3 ml | RESPIRATORY_TRACT | Status: AC | PRN
Start: 2021-02-06 — End: 2021-02-19

## 2021-02-06 MED ORDER — D5-1/2 NS & POTASSIUM CHLORIDE 20 MEQ/L IV
20 mEq/L | INTRAVENOUS | Status: AC
Start: 2021-02-06 — End: 2021-02-07
  Administered 2021-02-06 – 2021-02-07 (×3): via INTRAVENOUS

## 2021-02-06 MED FILL — ISOVUE-370  76 % INTRAVENOUS SOLUTION: 370 mg iodine /mL (76 %) | INTRAVENOUS | Qty: 105

## 2021-02-06 MED FILL — FENTANYL CITRATE (PF) 50 MCG/ML IJ SOLN: 50 mcg/mL | INTRAMUSCULAR | Qty: 2

## 2021-02-06 MED FILL — ACETAMINOPHEN 1,000 MG/100 ML (10 MG/ML) IV: 1000 mg/100 mL (10 mg/mL) | INTRAVENOUS | Qty: 100

## 2021-02-06 MED FILL — NOREPINEPHRINE BITARTRATE 8 MG/250 ML (32 MCG/ML) IN 0.9 % NACL IV: 8 mg/250 mL (32 mcg/mL) | INTRAVENOUS | Qty: 250

## 2021-02-06 MED FILL — ELECTROLYTE REPLACEMENT PROTOCOL: Qty: 1

## 2021-02-06 MED FILL — D5-1/2 NS & POTASSIUM CHLORIDE 20 MEQ/L IV: 20 mEq/L | INTRAVENOUS | Qty: 1000

## 2021-02-06 MED FILL — ISOVUE-370  76 % INTRAVENOUS SOLUTION: 370 mg iodine /mL (76 %) | INTRAVENOUS | Qty: 85

## 2021-02-06 MED FILL — NICARDIPINE 2.5 MG/ML IV: 25 mg/10 mL | INTRAVENOUS | Qty: 20

## 2021-02-06 MED FILL — ASPIRIN 300 MG RECTAL SUPPOSITORY: 300 mg | RECTAL | Qty: 1

## 2021-02-06 NOTE — Anesthesia Pre-Procedure Evaluation (Signed)
Relevant Problems   NEUROLOGY   (+) Stroke (HCC)      CARDIOVASCULAR   (+) Atrial fibrillation (HCC)       Anesthetic History   No history of anesthetic complications            Review of Systems / Medical History  Patient summary reviewed, nursing notes reviewed and pertinent labs reviewed    Pulmonary  Within defined limits                 Neuro/Psych       CVA (acute, L hemiparesis)  Neuromuscular disease (sciatica)    Comments: H/o subdural Cardiovascular    Hypertension        Dysrhythmias : atrial fibrillation      Exercise tolerance: >4 METS  Comments: 8/22:  Normal size left ventricle, normal wall thickness with normal systolic  function and a calculated ejection fraction of 75%. No regional wall  motion abnormalities.  Diastolic function is not determined due to rhythm.  Normal size right ventricle, normal systolic function.  No hemodynamic valvular pathology.  Moderate tricuspid regurgitation.  Estimated right ventricular systolic pressure: [id number].  Mild right atrial enlargement.   GI/Hepatic/Renal  Within defined limits              Endo/Other      Hypothyroidism  Blood dyscrasia (h/o DVT), arthritis and anemia     Other Findings   Comments: Hemochromatosis - phlebotomy q6 months           Physical Exam    Airway  Mallampati: II  TM Distance: 4 - 6 cm    Mouth opening: Normal     Cardiovascular  Regular rate and rhythm,  S1 and S2 normal,  no murmur, click, rub, or gallop             Dental  No notable dental hx       Pulmonary  Breath sounds clear to auscultation               Abdominal  GI exam deferred       Other Findings            Anesthetic Plan    ASA: 3, emergent  Anesthesia type: MAC            Anesthetic plan and risks discussed with: Patient

## 2021-02-06 NOTE — ED Notes (Signed)
TRANSFER - OUT REPORT:    Verbal report given to Shaunte (name) on [name] [name]  being transferred to 4322(unit) for routine progression of care       Report consisted of patient's Situation, Background, Assessment and   Recommendations(SBAR).     Information from the following report(s) SBAR, ED Summary, and MAR was reviewed with the receiving nurse.    Lines:   Peripheral IV 02/06/21 Right Antecubital (Active)   Site Assessment Clean, dry, & intact 02/06/21 0410   Phlebitis Assessment 0 02/06/21 0410   Infiltration Assessment 0 02/06/21 0410   Dressing Status Clean, dry, & intact 02/06/21 0410   Dressing Type Transparent 02/06/21 0410   Hub Color/Line Status Flushed 02/06/21 0410       Peripheral IV 02/06/21 Left Antecubital (Active)   Site Assessment Clean, dry, & intact 02/06/21 0419   Phlebitis Assessment 0 02/06/21 0419   Infiltration Assessment 0 02/06/21 0419   Dressing Status Clean, dry, & intact 02/06/21 0419   Dressing Type Transparent 02/06/21 0419   Hub Color/Line Status Flushed 02/06/21 0419        Opportunity for questions and clarification was provided.

## 2021-02-06 NOTE — ED Notes (Signed)
Secretary paged Dr. Allison Quarry due to increasingly slurred speech.  Call transferred to Dr. Raynald Kemp.

## 2021-02-06 NOTE — Op Note (Signed)
Brief Postoperative Note    Patient: [name] [name]  Date of Birth: 09/15/1933  MRN: [id number]    Date of Procedure: 02/06/2021     Pre-Op Diagnosis: Stroke, Acute    Post-Op Diagnosis: Same as preoperative diagnosis.      Procedure(s):  ANGIOGRAPHY CEREBRAL INTRACRNIAL W ANESTHESIA (96283)    Surgeon:   Caryl Asp, MD    Surgical Assistant: None    Anesthesia: * No anesthesia type entered *     Estimated Blood Loss (mL): see anesthesia records    Complications: None    Specimens: * No specimens in log *     Implants: * No implants in log *    Drains:   External Urinary Catheter 02/06/21 (Active)       Findings: see full op note    Electronically Signed by Leodis Binet, MD on 02/06/2021 at 11:25 AM

## 2021-02-06 NOTE — Progress Notes (Signed)
SPEECH- LANGUAGE PATHOLOGY NOTE   Patient: [name] [name] (87 y.o. female) May 06, 1933  Room: 4322/4322  Primary Diagnosis: Stroke [facility]) [I63.9]  CVA (cerebral vascular accident) (HCC) [I63.9]  Atrial fibrillation (HCC) [I48.91]    Procedure(s) (LRB):  ANGIOGRAPHY CEREBRAL INTRACRNIAL W ANESTHESIA (01601) (N/A) Day of Surgery  PMH:   Past Medical History:   Diagnosis Date    Arthritis     Deep venous thrombosis (HCC)     Endocrine disease     hypothyroid    Hemochromatosis     Hypertension        Date: 02/06/2021   Time:  1320     Attempted dysphagia eval, unable to complete 2*  pt still waking up from anesthesia. Pt transported back to her room within last hour, s/p  angiography cerebral intracranial with anesthesia. SLP will continue to follow per POC as appropriate.     Thank you,    Maud Deed, M.S., CCC-SLP  Speech-Language Pathologist  Office:  [phone] or by Secure Chat

## 2021-02-06 NOTE — Progress Notes (Signed)
Problem: Aspiration - Risk of  Goal: *Absence of aspiration  Outcome: Progressing Towards Goal     Problem: Pressure Injury - Risk of  Goal: *Prevention of pressure injury  Description: Document Braden Scale and appropriate interventions in the flowsheet.  Outcome: Progressing Towards Goal  Note: Pressure Injury Interventions:  Sensory Interventions: Assess changes in LOC, Maintain/enhance activity level         Activity Interventions: Pressure redistribution bed/mattress(bed type), PT/OT evaluation    Mobility Interventions: Pressure redistribution bed/mattress (bed type), PT/OT evaluation    Nutrition Interventions: Discuss nutritional consult with provider    Friction and Shear Interventions: Lift sheet, Minimize layers, Apply protective barrier, creams and emollients                Problem: Patient Education: Go to Patient Education Activity  Goal: Patient/Family Education  Outcome: Progressing Towards Goal

## 2021-02-06 NOTE — ED Notes (Signed)
Patient in transit to Neuro suite

## 2021-02-06 NOTE — Progress Notes (Signed)
 TRANSFER - IN REPORT:    Verbal report received from Megan (name) on [name] [name]  being received from Neuro Suite(unit) for routine progression of care      Report consisted of patient's Situation, Background, Assessment and   Recommendations(SBAR).     Information from the following report(s) SBAR, Kardex, Procedure Summary, Intake/Output, Recent Results, and Cardiac Rhythm AFib  was reviewed with the receiving nurse.    Opportunity for questions and clarification was provided.      Assessment completed upon patient's arrival to unit and care assumed.     Patient is without pain    Dr. Haydee at bedside assessing patient.    1445:  patient with complaints of 4/10 headache, Dr. Malachy is aware, CTA head and Neck ordered early, will continue to monitor.      1545:  Attempted x2 to insert NGT in both nares, kept coiling and unable to verify placement the second attempt.  Will re-attempt once patient is no longer HOB Flat at 1615.    Dr. Dennise at bedside assessing patient and speaking with family.    1640:  Hob elevated 30 degrees and immobilizer removed from RLE    Duboff inserted left nare at 75cm patient tolerated without difficulty, awaiting xray for placement.    1700: Attempted a third time with NP at bedside, duboff re-inserted however despite advancing tube, still kept coiling in the throat secondary to known hiatal hernia.  GI to see patient in the morning to insert NGT.    1728:  MRI checklist completed with patient's son [name].     1845:  Patient BP soft SBP 80's started Levo at 2 for BP goal of 100-140.

## 2021-02-06 NOTE — Op Note (Signed)
Op Notes by Hildred Alamin,  MD at 02/06/21 1250                Author: Hildred Alamin, MD  Service: Neurosurgery  Author Type: Physician       Filed: 02/06/21 1309  Date of Service: 02/06/21 1250  Status: Signed          Editor: Hildred Alamin, MD (Physician)                        NEUROSURGERY OPERATIVE REPORT          Patient: [name] [name]  MRN: [id number]   SSN: [id number]          Date of Birth: 08-25-1933   Age: 87 y.o.   Sex: female          Surgeon: Alpha Gula, MD      Date of Procedure: 02/06/2021      Preoperative Diagnosis: Right M1 occlusion      Postoperative Diagnosis: Same      Procedure:    1.  Right common femoral arteriogram (GROIN ACCESS: 9:30AM)   2.  Right innominate arteriogram   3.  Right internal carotid arteriogram   4.  Mechanical thrombectomy with Trevo stent retriever and mechanical aspiration (FIRST PASS 11:07AM)   5.  Right internal carotid arteriogram (TICI REPERFUSION: 11:12AM)      Indications: 87 y.o. female with LKN 8:30PM last night. This AM, she woke up with L sided weakness and dysarthria. 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 No IV-TPA was given. She has a history of atrial  fibrillation, but not on anticoagulation because of risk of falls. Lives in assisted living.  The risk, benefits, alternatives were clearly discussed with her son and power of attorney, and she would elected to have her undergo the above-stated procedure.      Procedure in Detail:    After consent, the patient was brought to the neurovascular suite.  Anesthesia was induced.  Lines were placed.  The patient was positioned supine with all pressure points well-padded.  The right groin was shaved, cleaned, and prepped in standard sterile  fashion.  Timeout was performed.  Systolic blood pressure goals were kept 140-180 throughout the case. Lidocaine was injected.  The right common femoral artery was accessed using a micropuncture kit and an 8 [place] short ACT  sheath was placed.  A connector  tubing was given to anesthesia to transduce blood pressure throughout the case. RAO angiogram revealed antegrade flow into the right common femoral artery with access point above the bifurcation, with no atherosclerosis or dissection, amenable to use  of a closure device.  Heparin 2000 units was administered IV.        A 088 penumbra catheter was advanced over a 5 [place] select catheter and 035 Glidewire across the aortic arch into the right innominate artery.  Based on the tortuosity of the aortic arch as well as the innominate artery, the 64F select catheter was removed.   The decision was made to remove the catheters and obtain more support. The A line and blood pressure cuff were correlated. The 8 French short sheath was exchanged for an Arrow 66F medium sheath catheter.  The 088 penumbra catheter was advanced over a  Simmons 2 catheter and Glidewire advantage across the aortic arch and into innominate artery and the innominate artery.  Multiple attempts were made to  access the innominate artery and right common carotid artery and right internal carotid artery with  much difficulty. Attempts were made to catheterize the arch and innominate artery with the Glidewire advantage and 035 Glidewire.  After pulling out the Glidewire advantage, there was evidence of a significant amount of clot in the [facility] catheter.   This occurred on two separate occasions. The Simmons catheter had to be removed twice based on the degree of clot that was found in the catheter.  Additional 1000 units of heparin was administered.      After multiple attempts, the Arrow sheath and 088 catheter were parked on the proximal curve of the arch, at the take-off of the innominate artery. The [facility] catheter was removed. AP and lateral angiograms of the right innominate artery provided a roadmap  for further catheterization into the R ICA. There was no evidence of significant carotid stenosis.  Once the there  was sufficient support, the 035 Glidewire was advanced into the right internal carotid artery and the 5 [place] select catheter followed  into the proximal right cervical segment of the ICA.  The [place] was removed.  AP and lateral angiograms of the right internal carotid artery revealed antegrade flow into the ICA, ACA, M1 segment, with evidence of a right M1 occlusion.        Next, a phenom 021 microcatheter was advanced over a Synchro 2 standard microwire into the right ICA and right MCA.  The microwire was removed.  Next a Trevo 4 x 40 stent retriever was advanced, centered on the focal area of occlusion and deployed under  fluoroscopic guidance.  The microcatheter was removed.  After approximately 5 minutes, the stent retriever was removed while the 25F select catheter was placed under aspiration.  As the stent was removed, the 5 [place] select catheter and the 088 penumbra  catheter advanced further into the cervical segment of the ICA.  There was a significant amount of thick dark clot noted in the stent retriever.  Post mechanical thrombectomy angiogram of the right ICA revealed antegrade flow into the ICA, ACA, MCA, with  TICI 3 reperfusion.  The catheters were withdrawn.  Hemostasis was obtained using a 6 / 7 Mynx and manual compression.      IMPRESSION:    1.  Right M1 occlusion s/p mechanical thrombectomy with Trevo stent retriever with TICI 3 reperfusion.

## 2021-02-06 NOTE — Progress Notes (Signed)
Progress Note Template    Patient: [name] [name]               Sex: female          DOA: 02/06/2021       Date of Birth:  1934/03/12      Age:  87 y.o.                Current Problems and Procedures:   Problem List:  Active Problems:    Atrial fibrillation (HCC) (10/31/2020)      Overview: ? Noted this AM (10/31/20)      ? Rate controlled (84 BPM)      ? No previously documented hx of AFIB      CHADS2VASc = 6  (08/27/17 CT Acute interhemispheric fissure subdural       hemorrhage. Chronic small vessel ischemic change)      ? ECHO:  EF 75%< LV 25/44, S/PW 9/10, LAVI 28, Mild MR, Mod TR, RVSP 43      Stroke (HCC) (02/06/2021)      CVA (cerebral vascular accident) (HCC) (02/06/2021)        Assessment and Plan:      Addendum:           Pt seen post-procedure in ICU.    Left-sided weakness and neglect.   C/o some nausea, no sob, chest pain or other pain.   AF with CVR on tele.     Spoke with son per telephone.   Pt's son confirms that Pt does not want aggressive resuscitative efforts and is DNR.    DC Plan:   Discharge: Other   TBD.      [name], MD  February 06, 2021

## 2021-02-06 NOTE — Anesthesia Post-Procedure Evaluation (Signed)
Procedure(s):  ANGIOGRAPHY CEREBRAL INTRACRNIAL W ANESTHESIA (66063).    MAC    Anesthesia Post Evaluation      Multimodal analgesia: multimodal analgesia used between 6 hours prior to anesthesia start to PACU discharge  Patient location during evaluation: ICU  Patient participation: complete - patient participated  Level of consciousness: awake and alert  Pain management: adequate  Airway patency: patent  Anesthetic complications: no  Cardiovascular status: acceptable  Respiratory status: acceptable  Hydration status: acceptable  Post anesthesia nausea and vomiting:  none  Final Post Anesthesia Temperature Assessment:  Normothermia (36.0-37.5 degrees C)      INITIAL Post-op Vital signs:   Vitals Value Taken Time   BP 116/71 02/06/21 1218   Temp 36.2 ??C (97.2 ??F) 02/06/21 1228   Pulse 67 02/06/21 1228   Resp 20 02/06/21 1228   SpO2 95 % 02/06/21 1228   Vitals shown include unvalidated device data.

## 2021-02-06 NOTE — ED Notes (Signed)
[employer] [place] entering room for lab draws.

## 2021-02-06 NOTE — ED Provider Notes (Signed)
[facility]  Emergency Department Treatment Report    Patient: [name] [name] Age: 87 y.o. Sex: female    Date of Birth: Jun 15, 1933 Admit Date: 02/06/2021 PCP: [name], MD   MRN: [id number]  CSN: [id number]  ED Attending: Jerilynn Som, MD     Room: ER25/ER25 Time Dictated: 5:54 AM      Patient information was obtained from patient  History/Exam limitations: none  Patient presented to the Emergency Department EMS      Chief Complaint   Chief Complaint   Patient presents with    Stroke       History of Present Illness   87 y.o. female with PMH of hemochromatosis, thyroid disorder, hypertension, history of A. fib who presents with neurologic symptoms of left-sided weakness with right gaze preference after falling out of bed this morning.  Last known well was 46 by family that lives with her.  Grandsons are both staying at her house right now for the holidays.  Patient went to bed normal and awoke 0330.       Patient has no chest pain, palpitations.    Review of Systems     Constitutional: No fever, chills, or weight loss  Eyes: No visual symptoms or eye pain.  ENT: No sore throat, runny nose or ear pain.  Respiratory: No cough, dyspnea or wheezing.  Cardiovascular: chest pain as per HPI  Gastrointestinal: No vomiting, diarrhea or abdominal pain.  Genitourinary: No dysuria, frequency, or urgency.  Musculoskeletal: No joint pain or swelling.  Integumentary: No rashes.  Neurological: No headaches.  Neurological symptoms as per HPI.    Past Medical/Surgical History     Past Medical History:   Diagnosis Date    Arthritis     Deep venous thrombosis (HCC)     Endocrine disease     hypothyroid    Hemochromatosis     Hypertension      Past Surgical History:   Procedure Laterality Date    HX ORTHOPAEDIC      left knee replacement       Social History     Social History     Socioeconomic History    Marital status: WIDOWED   Tobacco Use    Smoking status: Never    Smokeless tobacco: Never   Vaping Use     Vaping Use: Never used   Substance and Sexual Activity    Alcohol use: No    Drug use: No       Family History   No family history on file.    Current Medications     Prior to Admission Medications   Prescriptions Last Dose Informant Patient Reported? Taking?   albuterol-ipratropium (DUO-NEB) 2.5 mg-0.5 mg/3 ml nebu   No No   Sig: 3 mL by Nebulization route every four (4) hours as needed (sob).   dexAMETHasone (DECADRON) 4 mg tablet   Yes No   furosemide (LASIX) 20 mg tablet   Yes No   Sig: Take 1 Tablet by mouth daily.   hydroxychloroquine (PLAQUENIL) 200 mg tablet   Yes No   Sig: Take 200 mg by mouth two (2) times a day.   lactulose (CHRONULAC) 10 gram/15 mL solution   Yes No   Sig: Take 30 mL by mouth two (2) times a day.   levothyroxine (SYNTHROID) 125 mcg tablet   Yes No   Sig: Take 125 mcg by mouth Daily (before breakfast).   lisinopril (PRINIVIL, ZESTRIL) 10 mg  tablet   Yes No   Sig: Take 10 mg by mouth daily.   magnesium oxide (MAG-OX) 400 mg tablet   Yes No   Sig: Take 400 mg by mouth in the morning.   melatonin 3 mg tablet   Yes No   Sig: Take 1 Tablet by mouth nightly as needed for Insomnia.   metoprolol tartrate (LOPRESSOR) 25 mg tablet   Yes No   Sig: Take 1 Tablet by mouth every eight (8) hours.   potassium chloride (K-DUR, KLOR-CON M20) 20 mEq tablet   Yes No   Sig: Take 2 Tablets by mouth daily.   rifAXIMin (XIFAXAN) 550 mg tablet   Yes No   Sig: Take 1 Tablet by mouth two (2) times a day.   tamsulosin (FLOMAX) 0.4 mg capsule   Yes No   Sig: Take 1 Capsule by mouth daily.      Facility-Administered Medications: None       Allergies     Allergies   Allergen Reactions    Shellfish Derived Hives    Sulfa (Sulfonamide Antibiotics) Hives       Physical Exam     ED Triage Vitals   Enc Vitals Group      BP 02/06/21 0442 121/81      Pulse (Heart Rate) 02/06/21 0443 92      Resp Rate 02/06/21 0442 18      Temp 02/06/21 0508 (P) 97.5 ??F (36.4 ??C)      Temp src --       O2 Sat (%) 02/06/21 0442 95 %       Weight --       Height --       Head Circumference --       Peak Flow --       Pain Score --       Pain Loc --       Pain Edu? --       Excl. in GC? --        Constitutional: Patient appears well developed and well nourished. Appearance and behavior are age and situation appropriate.  HEENT: Conjunctiva clear.  Pupils round and reactive. Mucous membranes moist, non-erythematous. Surface of the pharynx, palate, and tongue are pink, moist and without lesions.  Neck: supple, non tender, symmetrical, no masses or JVD.   Respiratory: lungs clear to auscultation, nonlabored respirations. No tachypnea or accessory muscle use.  Cardiovascular: heart regular rate and rhythm without murmur rubs or gallops.   Calves soft and non-tender. Distal pulses 2+ and equal bilaterally.    Gastrointestinal:  Abdomen soft, nontender without complaint of pain to palpation  Musculoskeletal: Joints no swelling  Integumentary: warm and dry without rashes or lesions  Neurologic:  alert and oriented, speech slurred with obvious left facial droop.  Sensation intact, decreased muscle strength with inability to lift against gravity left arm and left leg  Impression and Management Plan   Patient presents with sudden neurologic symptoms concerning for stroke with symptoms of left-sided paralysis, slurred speech and facial droop.  Last known well at 1930.  Time arriveal to ED 0408.  Stroke team activation done per protocol with NIH stroke scale performed.  Neurologic workup with EKG, labs, and cardiac monitoring ordered.        Diagnostic Studies   Lab:   Results for orders placed or performed during the hospital encounter of 02/06/21   CBC W/O DIFF   Result Value Ref Range    WBC  6.7 4.0 - 11.0 1000/mm3    RBC 4.08 3.60 - 5.20 M/uL    HGB 12.8 (L) 13.0 - 17.2 gm/dl    HCT 17.6 16.0 - 73.7 %    MCV 93.6 80.0 - 98.0 fL    MCH 31.4 25.4 - 34.6 pg    MCHC 33.5 30.0 - 36.0 gm/dl    PLATELET 106 269 - 485 1000/mm3    MPV 11.2 (H) 6.0 - 10.0 fL    RDW-SD  44.3 36.4 - 46.3     METABOLIC PANEL, BASIC   Result Value Ref Range    Potassium 3.7 3.5 - 5.1 mEq/L    Chloride 106 98 - 107 mEq/L    Sodium 139 136 - 145 mEq/L    CO2 21 20 - 31 mEq/L    Glucose 180 (H) 74 - 106 mg/dl    BUN 12 9 - 23 mg/dl    Creatinine 4.62 7.03 - 1.02 mg/dl    GFR est AA >50.0      GFR est non-AA 58      Calcium 9.4 8.7 - 10.4 mg/dl    Anion gap 12 5 - 15 mmol/L       Imaging:    XR CHEST SNGL V    Result Date: 02/06/2021  EXAM: XR CHEST SNGL V INDICATION: Stroke  COMPARISON: 11/08/2020 WORKSTATION ID: [id number] TECHNIQUE: Frontal view. FINDINGS: SUPPORT DEVICES: None. LUNGS/PLEURA: No consolidation or pleural effusion. Bilateral chronic reticular lung markings are noted. HEART/MEDIASTINUM: Stable cardiomegaly. OTHER: None.     IMPRESSION: No acute cardiopulmonary process. Electronically signed by: Albin Felling, MD 02/06/2021 5:52 AM EST     CT HEAD WO CONT    Result Date: 02/06/2021  EXAM:  CT HEAD WO CONT HISTORY: L sided weakness COMPARISON: 10/25/2020 WORKSTATION ID: [id number] TECHNIQUE: Multiplanar CT images of the head were obtained without contrast. All CT exams at this facility use one or more dose reduction techniques including automatic exposure control, mA/kV adjustment per patient's size, or iterative reconstruction technique. FINDINGS: No acute intracranial hemorrhage. No mass effect or evidence of acute infarction. Mild diffuse parenchymal volume loss. No hydrocephalus. There is subcortical and periventricular hypodensity compatible with gliosis due to nonacute ischemic change. Orbits are unremarkable.  Paranasal sinuses and mastoid air cells are unremarkable. Calvarium intact. Aspect score: 10     IMPRESSION: No acute intracranial abnormality. Electronically signed by: Albin Felling, MD 02/06/2021 4:38 AM EST     CTA HEAD    Result Date: 02/06/2021  EXAMINATION: CTA NECK, CTA HEAD INDICATION: L sided weakness COMPARISON: No comparison available. TECHNIQUE: CTA Head and  Neck with IV contrast. Sagittal and coronal reconstructions. 3D-MIP imaging was performed. Stenosis calculated using NASCET criteria. Reference per NASCET criteria for degree of stenosis: MILD: less than 50% stenosis. MODERATE: 50-69% stenosis. SEVERE: 70-94% stenosis. NEAR OCCLUSION: 95-99% stenosis. Conventional, CT, or MR angiographic measurements of internal carotid artery (ICA) stenoses are based on a ratio of artery diameters, with the ICA stenosis as the numerator and the distal ICA, just beyond the stenosis, as the denominator. All CT exams at this facility use one or more dose reduction techniques including automatic exposure control, mA/kV adjustment per patient's size, or iterative reconstruction technique. WORKSTATION ID: [id number] FINDINGS: Head Vasculature: No AVM, aneurysm or dissection. There is focal severe stenosis of the right distal M1 MCA with marked decreased enhancement of the MCA vessels distal to the region of stenosis compared to the contralateral side. Neck Vasculature: No dissection or hemodynamically significant  stenosis. Brain: No enhancing brain lesion. Neck Soft Tissues: No significant lymph node enlargement. Other: Imaged lung apices are unremarkable.     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 Findings communicated by phone on 02/06/2021 4:44 AM EST Electronically signed by: Albin Felling, MD 02/06/2021 4:46 AM EST     CTA NECK    Result Date: 02/06/2021  EXAMINATION: CTA NECK, CTA HEAD INDICATION: L sided weakness COMPARISON: No comparison available. TECHNIQUE: CTA Head and Neck with IV contrast. Sagittal and coronal reconstructions. 3D-MIP imaging was performed. Stenosis calculated using NASCET criteria. Reference per NASCET criteria for degree of stenosis: MILD: less than 50% stenosis. MODERATE: 50-69% stenosis. SEVERE: 70-94% stenosis. NEAR OCCLUSION: 95-99% stenosis. Conventional, CT, or MR angiographic measurements of internal carotid artery (ICA) stenoses  are based on a ratio of artery diameters, with the ICA stenosis as the numerator and the distal ICA, just beyond the stenosis, as the denominator. All CT exams at this facility use one or more dose reduction techniques including automatic exposure control, mA/kV adjustment per patient's size, or iterative reconstruction technique. WORKSTATION ID: [id number] FINDINGS: Head Vasculature: No AVM, aneurysm or dissection. There is focal severe stenosis of the right distal M1 MCA with marked decreased enhancement of the MCA vessels distal to the region of stenosis compared to the contralateral side. Neck Vasculature: No dissection or hemodynamically significant stenosis. Brain: No enhancing brain lesion. Neck Soft Tissues: No significant lymph node enlargement. Other: Imaged lung apices are unremarkable.     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 Findings communicated by phone on 02/06/2021 4:44 AM EST Electronically signed by: Albin Felling, MD 02/06/2021 4:46 AM EST     CT PERF W CBF    Result Date: 02/06/2021  EXAMINATION: CT PERF W CBF INDICATION: L sided weakness COMPARISON: 10/25/2020 TECHNIQUE: CT perfusion with cerebral blood flow. Maps of cerebral blood flow, cerebral blood volume, and mean transit time were generated using perfusion source data. All CT exams at this facility use one or more dose reduction techniques including automatic exposure control, mA/kV adjustment per patient's size, or iterative reconstruction technique. WORKSTATION ID: [id number] FINDINGS: Increased mean transit time and decreased cerebral blood flow with mildly decreased blood volume in the right MCA distribution suggestive of acute infarct and ischemia penumbra.     IMPRESSION: Perfusion mismatch in the right MCA distribution suggestive of acute infarct and ischemic penumbra. Findings communicated by phone on 02/06/2021 4:42 AM EST Electronically signed by: Albin Felling, MD 02/06/2021 4:44 AM EST        Medical  Decision Making/ED Course     Medications   aspirin tablet 325 mg (has no administration in time range)   iopamidoL (ISOVUE-370) 76 % injection 105 mL (105 mL IntraVENous Given 02/06/21 0426)       Diagnostics revealed CBC stable  Chemistry stable    CT of the head negative  CTA of the head shows severe stenosis of the right distal MCA M1 MCA  Cerebral perfusion study shows mismatch right MCA distribution suggesting acute infarct ischemic 3 Ambra.    EKG shows atrial fibrillation at controlled rate-this is old chronic finding for the patient.  Patient in the past had not been a candidate for anticoagulation due to concerns of falls.    Teleneurology consulted.  Dr. Margaretha Seeds saw the patient and consulted by phone.  Consultation not transcribed yet.    Patient is not a thrombolytic candidate because patient last seen well time 2130  is  outside thrombolytic window.  Dr Wannetta Sender has spoken with Dr Allison Quarry and Dr Allison Quarry recommending intervention to help stenosis.  Case discussed with Dr Allison Quarry as well.  Son at bedside.  Dr Emmit Alexanders PCP consulted and will be admitting ICU after intervention.    Informed by nurse that patient speech more slurred and now harder to understand.  I went to reassess and patient has worsening speech but other deficits same, patient alert.  Plan to give fluids to support blood pressure and careful neuro checks.  Spoke again with Dr Allison Quarry.  Dr Allison Quarry coming to assess and plans to take to intervention.    Critical Care  Performed by: Jerilynn Som, MD  Authorized by: Jerilynn Som, MD     Critical care provider statement:     Critical care time (minutes):  45    Critical care time was exclusive of:  Separately billable procedures and treating other patients    Critical care was necessary to treat or prevent imminent or life-threatening deterioration of the following conditions:  CNS failure or compromise    Critical care was time spent personally by me on the following activities:  Development of treatment plan  with patient or surrogate, discussions with consultants, evaluation of patient's response to treatment, obtaining history from patient or surrogate, ordering and performing treatments and interventions, ordering and review of laboratory studies, ordering and review of radiographic studies and re-evaluation of patient's condition    I assumed direction of critical care for this patient from another provider in my specialty: no      Care discussed with: admitting provider       Final Diagnosis       ICD-10-CM ICD-9-CM   1. Acute stroke due to occlusion of right middle cerebral artery [facility])  I63.511 434.91     Disposition   ICU      Jerilynn Som, MD  February 06, 2021    My signature above authenticates this document and my orders, the final    diagnosis (es), discharge prescription (s), and instructions in the Epic    record.       Nursing notes have been reviewed by the physician.  Dragon medical dictation software was used for portions of this report. Unintended voice recognition errors may occur.

## 2021-02-06 NOTE — Progress Notes (Signed)
5:07 PM   I was called to bedside for unable to pass dobhoff due to large hiatal hernia. Spoke with Dr. Marilynn Rail GI who will p lace in Am. Aspirin ordered rectally

## 2021-02-06 NOTE — ED Notes (Signed)
Bedside Shift report completed with off going RN.  Introduced self and role to patient and updated whiteboard.

## 2021-02-06 NOTE — Consults (Signed)
[facility]     ICU Consult Note    PATIENT:  [name] [name]  SEX:   female  DOB: [birth year]  AGE: 87 y.o.  DOA: 02/06/2021  LOS: LOS: 0 days   Code Status: Prior    DATE OF CONSULT:February 06, 2021    REFERRING PHYSICIAN:  Dr. 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 Allison Quarry 02/06/21    - Atrial fibrillation not on AC due to multiple falls resulting in hx of KNL(9767)    - Hx of chronic diastolic HF ECHO : EF 75%< LV 34/19, S/PW 9/10, LAVI 28, Mild MR, Mod TR, RVSP 43    - Hx of COVID in August 2022    - Hx of Hypertension    -Hx of RA on methotrexate and plaquenil     -Hx of hemochromatosis with cirrhosis    -Hx of thyroid disorder    - Hx of urinary retention -takes flomax at home    - Hx of SBO from periumbical hernia in 08/22    PLAN:     -Currently on RA. Supplemental PRN FiO2 titrate 92 % and above.    -Fluids D5 1/2 ns with 20 KCL at 60 ml/hr  -Keep MAP >65 .   -Neurologic and vascular checks, NIH scoring, and pupillometry readings per ICU protocol  -Blood pressure parameters: SBP goal between 100- 140  Autoregulate as much as possible.  May use Neo-Synephrine  or Cardene as necessary.  -Repeat head CT STAT with any acute neurological change.  - Neuro checks q 1 hour  -HOB 30  - PaCO2: 35-40  - Labetalol PRN  - Cleviprex/Cardene gtt  -TEE  -Currently on no antibiotics. Will monitor for SIRS or sepsis.    -Avoid nephrotoxins. Renally dose medications. Monitor urine output. Daily weights.    -Electrolyte replacement per protocol. Goal K > 4, Ph > 3, and Mg > 2     -Glucomander per protocol    -SUP: none    -DVT prophylaxis: SCDS, on hold    -BMP and CBC in the AM, mag, phos, hemoglobin A1c, lipid panel    -DNR    -Further recommendations will be based on the patient's response to recommended treatment and results of the investigation ordered.    [x] Total critical care time  exclusive of procedures 51 minutes with complex decision making performed and > 50% time spent in face to face consultation.    HISTORY OF PRESENT ILLNESS     [name] [name] is a 87 y.o. WHITE/NON-HISPANIC female with PMHx a.fib, not on AC, HTN, hemachromatosis, thyroid disorder, hepatic encephalopathy presents to [facility] general ER after a fall from bed at her nursing facility. Last known normal was 1930. She was found to be altered with left sided paralysis when she fell out of bed at 330 Am this morning. Was found to have R middle artery infarct and taken to neurovascular suite  for thrombectomy.    History from chart per ED record. Patient is minimally responsive and is unable to provide a history of present illness at this time. There is no family immediately available to obtain a history from.    Interval History/Events:  Arrives in ICU , no acute events.    Past Medical History  Past Medical History:   Diagnosis Date    Arthritis     Deep  venous thrombosis (HCC)     Endocrine disease     hypothyroid    Hemochromatosis     Hypertension        Past Surgical History  Past Surgical History:   Procedure Laterality Date    HX ORTHOPAEDIC      left knee replacement       Family History  History reviewed. No pertinent family history.    Social History  Social History     Tobacco Use    Smoking status: Never    Smokeless tobacco: Never   Vaping Use    Vaping Use: Never used   Substance Use Topics    Alcohol use: No    Drug use: No        Medications  Prior to Admission medications    Medication Sig Start Date End Date Taking? Authorizing Provider   furosemide (LASIX) 20 mg tablet Take 1 Tablet by mouth daily. 11/13/20   Mauser, Bernadene Bell, MD   lactulose (CHRONULAC) 10 gram/15 mL solution Take 30 mL by mouth two (2) times a day. 11/13/20   Mauser, Bernadene Bell, MD   melatonin 3 mg tablet Take 1 Tablet by mouth nightly as needed for Insomnia. 11/13/20   Mauser, Bernadene Bell, MD   metoprolol tartrate (LOPRESSOR) 25 mg tablet  Take 1 Tablet by mouth every eight (8) hours. 11/13/20   Mauser, Bernadene Bell, MD   potassium chloride (K-DUR, KLOR-CON M20) 20 mEq tablet Take 2 Tablets by mouth daily. 11/13/20   Mauser, Bernadene Bell, MD   dexAMETHasone (DECADRON) 4 mg tablet  11/14/20   Mauser, Bernadene Bell, MD   rifAXIMin [employer]) 550 mg tablet Take 1 Tablet by mouth two (2) times a day. 11/13/20   Mauser, Bernadene Bell, MD   tamsulosin (FLOMAX) 0.4 mg capsule Take 1 Capsule by mouth daily. 11/14/20   Mauser, Bernadene Bell, MD   magnesium oxide (MAG-OX) 400 mg tablet Take 400 mg by mouth in the morning.    Provider, Historical   albuterol-ipratropium (DUO-NEB) 2.5 mg-0.5 mg/3 ml nebu 3 mL by Nebulization route every four (4) hours as needed (sob). 08/31/17   Musselmani, Cleon Gustin, MD   hydroxychloroquine (PLAQUENIL) 200 mg tablet Take 200 mg by mouth two (2) times a day.    Provider, Historical   lisinopril (PRINIVIL, ZESTRIL) 10 mg tablet Take 10 mg by mouth daily.    Other, Phys, MD   levothyroxine (SYNTHROID) 125 mcg tablet Take 125 mcg by mouth Daily (before breakfast).    Other, Phys, MD       Current Facility-Administered Medications   Medication Dose Route Frequency    aspirin tablet 325 mg  325 mg Oral NOW    NOREPINephrine (LEVOPHED) 8 mg in 0.9% NS infusion  0.5-16 mcg/min IntraVENous TITRATE    niCARdipine (CARDENE) 50 mg in 0.9% sodium chloride 250 mL infusion  0-15 mg/hr IntraVENous TITRATE       Allergy  Allergies   Allergen Reactions    Shellfish Derived Hives    Sulfa (Sulfonamide Antibiotics) Hives         ROS     Review of Systems   Unable to perform ROS: Acuity of condition      Unable to perform an adequate HPI and ROS within the constraints imposed by the patient's altered mental status.    Physical Exam          GENERAL: Awake, alert, comfortable,  dysarthric   HEENT: Oral mucosa moist. No  thrush.   NECK: Supple. Trachea midline.   RESPIRATORY: Bilateral BS present, decreased at bases. No rales or rhonchi.   CARDIOVASCULAR: S1 and S2 present. No  murmur, rub, or thrill. Irregular, iregular  ABDOMEN: Soft and nontender with positive bowel sounds. No organomegaly.   MUSCULOSKELETAL: No joint effusions or joint tenderness.   SKIN: No rash. Skin is warm, dry, and intact.   NEUROLOGICAL: Conscious, left sided weakness, moves all ext, some left sided neglect    Patient Vitals for the past 24 hrs:   Temp Pulse Resp BP SpO2   02/06/21 1144 -- 78 15 127/85 100 %   02/06/21 0901 -- 73 17 126/82 --   02/06/21 0731 -- 78 21 (!) 125/91 96 %   02/06/21 0508 (P) 97.5 ??F (36.4 ??C) -- -- -- --   02/06/21 0500 -- 72 23 (!) 124/90 95 %   02/06/21 0443 -- 92 25 -- 95 %   02/06/21 0442 -- -- 18 121/81 95 %       Intake/Output:   Last shift:      11/24 0701 - 11/24 1900  In: 450 [I.V.:450]  Out: 150 [Urine:150]  Last 3 shifts: No intake/output data recorded.    Intake/Output Summary (Last 24 hours) at 02/06/2021 1211  Last data filed at 02/06/2021 1140  Gross per 24 hour   Intake 450 ml   Output 150 ml   Net 300 ml        Ventilator Settings:  Mode Rate Tidal Volume Pressure FiO2 PEEP                    Peak airway pressure:      Minute ventilation:        Imaging:    XR Results (most recent):  Results from Hospital Encounter encounter on 02/06/21    XR CHEST SNGL V    Narrative  EXAM: XR CHEST SNGL V  INDICATION: Stroke  COMPARISON: 11/08/2020  WORKSTATION ID: [id number]  TECHNIQUE: Frontal view.    FINDINGS:    SUPPORT DEVICES: None.    LUNGS/PLEURA: No consolidation or pleural effusion. Bilateral chronic reticular  lung markings are noted.    HEART/MEDIASTINUM: Stable cardiomegaly.    OTHER: None.    Impression  IMPRESSION: No acute cardiopulmonary process.    Electronically signed by: Albin Felling, MD 02/06/2021 5:52 AM EST    CT Results (most recent):  Results from Hospital Encounter encounter on 02/06/21    CT PERF W CBF    Narrative  EXAMINATION: CT PERF W CBF    INDICATION: L sided weakness    COMPARISON: 10/25/2020    TECHNIQUE: CT perfusion with cerebral blood flow.  Maps of cerebral blood flow,  cerebral blood volume, and mean transit time were generated using perfusion  source data.    All CT exams at this facility use one or more dose reduction techniques  including automatic exposure control, mA/kV adjustment per patient's size, or  iterative reconstruction technique.  WORKSTATION ID: [id number]    FINDINGS:    Increased mean transit time and decreased cerebral blood flow with mildly  decreased blood volume in the right MCA distribution suggestive of acute infarct  and ischemia penumbra.    Impression  IMPRESSION: Perfusion mismatch in the right MCA distribution suggestive of acute  infarct and ischemic penumbra.    Findings communicated by phone on 02/06/2021 4:42 AM EST    Electronically signed by: Albin Felling, MD 02/06/2021 4:44 AM EST      [  x]See my orders for details    My assessment, plan of care, findings, medications, side effects etc were discussed with:  [x] nursing [] PT/OT    [] respiratory therapy [x] Dr.   [] family [x] Patient     Thank you for asking to assist in this patient's care. We will follow along with you.    ,  PULMONARY CRITICAL CARE  PAGER [phone]   February 06, 2021  12:11 PM             Dragon medical dictation software was used for portions of this report. Unintended voice transcription errors may have occurred.       [facility]    I have reviewed Hillary Struss Farnworth's  chart. I saw and examined the patient, personally performed the critical or key portions of the service and discussed the management with the treatment team. I reviewed the  PA's [place], PA) note and concur with the history, findings and plan of care, which include's my edits and my assessment and plan. Laboratory, hemodynamic and radiological data were reviewed.     Assessment:  Acute right MCA CVA S/P mechanical thrombectomy on 02/06/21  Known history of chronic A. fib not on anticoagulation due to multiple  falls resulting subdural hematoma in 2019  History of chronic diastolic congestive heart failure.  Last echo demonstrated LVEF around 70% and RVSP 43  History of COVID infection in August 2022  History of hypertension  History of rheumatoid arthritis on methotrexate and Plaquenil  History of hemochromatosis with cirrhosis  History of thyroid cancer  History of urinary retention takes Flomax  History of small bowel obstruction due to periumbilical hernia in August 2022      Plan:  On RA. May consider supplemental oxygen. Target oxygen saturation >90%  Neurochecks as per ICU protocol.  Keep head of the bed elevated.  If there is any acute changes obtain stat CT of the head without contrast  Target SBP between 100 mg  to 140 mm hg   Repeat CT head without contrast in 6 hours after procedure.  Electrolyte replacement as per protocol  Management of hyperglycemia as per Glucomander protocol.  SCDs for DVT prophylaxis  DNR CODE STATUS      Manoj D. Rueben Bash, MD, MBA   Pulmonary and Critical Care Medicine

## 2021-02-06 NOTE — Consults (Signed)
Consults by Leodis Binet,  MD at 02/06/21 0900                Author: Leodis Binet, MD  Service: Neurosurgery  Author Type: Physician       Filed: 02/06/21 1250  Date of Service: 02/06/21 0900  Status: Signed          Editor: Leodis Binet, MD (Physician)                        NEUROSURGERY CONSULT / H&P NOTE          Patient: [name] [name]  MRN: [id number]   SSN: [id number]          Date of Birth: 05-25-1933   Age: 87 y.o.   Sex: female          CONSULT DATE: 02/06/2021      REQUESTING PHYSICIAN: Patronus Neurology, MD      CONSULTING PHYSICIAN: Leodis Binet, MD      REASON FOR CONSULTATION: R M1 occlusion      HPI: 87 y.o. female with LKN 8:30PM last night. This AM, she woke up with L sided weakness and dysarthria. 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 No IV-TPA was given. Hx of atrial fibrillation, but not on anticoagulation because of risk of falls. Lives in assisted living.         Patient Active Problem List        Diagnosis  Code         ?  Deep venous thrombosis (HCC)  I82.409     ?  Subdural hematoma caused by concussion  S06.5XAA     ?  Subdural hematoma  S06.5XAA     ?  Metabolic encephalopathy  G93.41     ?  Contusion of left hip  S70.02XA     ?  Left elbow contusion  S50.02XA     ?  Multiple falls  R29.6     ?  Atrial fibrillation (HCC)  I48.91     ?  Other partial intestinal obstruction (HCC)  K56.690     ?  COVID-19  U07.1     ?  Stroke [facility])  I63.9         ?  CVA (cerebral vascular accident) (HCC)  I63.9             Past Medical History:        Diagnosis  Date         ?  Arthritis       ?  Deep venous thrombosis (HCC)       ?  Endocrine disease            hypothyroid         ?  Hemochromatosis           ?  Hypertension               Past Surgical History:         Procedure  Laterality  Date          ?  HX ORTHOPAEDIC              left knee replacement             Social History          Tobacco Use         ?  Smoking status:  Never     ?  Smokeless  tobacco:  Never       Substance Use Topics         ?  Alcohol use:  No            History reviewed. No pertinent family history.        Allergies        Allergen  Reactions         ?  Shellfish Derived  Hives         ?  Sulfa (Sulfonamide Antibiotics)  Hives             Current Facility-Administered Medications          Medication  Dose  Route  Frequency           ?  aspirin tablet 325 mg   325 mg  Oral  NOW     ?  NOREPINephrine (LEVOPHED) 8 mg in 0.9% NS infusion   0.5-16 mcg/min  IntraVENous  TITRATE           ?  niCARdipine (CARDENE) 50 mg in 0.9% sodium chloride 250 mL infusion   0-15 mg/hr  IntraVENous  TITRATE           ROS:   10 pt ROS negative except per HPI.          EXAM:     Visit Vitals      BP  127/85     Pulse  78     Temp  (P) 97.5 ??F (36.4 ??C)     Resp  15     Ht  5\' 3"  (1.6 m)     Wt  72.4 kg (159 lb 11.2 oz)     SpO2  100%        BMI  28.29 kg/m??           GEN: in mild distress, opens eyes spontaneously, follows commands, with son at bedside   HEENT: NC, AT, neck supple, trachea midline   RESP: non-labored   GI: non-distended   SKIN: warm and moist   PSYCH: cooperative   MSK: Strength antigravity on RUE/RLE. 0/5 LUE/LLE. SILT. NEURO: AO x 1.       NIHSS: 15   1A: 0   1B: 1   1C: 0   2: 0   3: 2   4: 2   5A: 4   5B: 0   6A: 4   6B: 0   7: 0   8: 1   9: 0   10: 1   11: 0            LABS:     CBC     Lab Results      Component  Value  Date/Time        WBC  8.8  02/06/2021 07:45 AM        RBC  3.94  02/06/2021 07:45 AM        HCT  36.4 (L)  02/06/2021 07:45 AM        MCV  92.4  02/06/2021 07:45 AM        MCH  31.0  02/06/2021 07:45 AM        MCHC  33.5  02/06/2021 07:45 AM        RDW  13.4  05/04/2012 09:52 AM               Basic Metabolic Profile     Lab Results  Component  Value  Date        NA  139  02/06/2021        CO2  22  02/06/2021        BUN  12  02/06/2021                   Coagulation     Lab Results      Component  Value  Date        INR  1.2 (H)  02/06/2021        APTT  31.5   08/27/2017                  Intake / Output          Intake/Output Summary (Last 24 hours) at 02/06/2021 1203   Last data filed at 02/06/2021 1140   Gross per 24 hour      Intake  450 ml      Output  150 ml      Net  300 ml                     IMAGING:   Personally reviewed. Per HPI.       ASSESSMENT / PLAN:    87 y.o.  female with wake up stroke, NIHSS 15, CT/CTA/CTP + R M1 occlusion with ischemic penumbra. No IV-TPA given. Discussed the risks, benefits, and alternatives with patient's son and POA, and he  elected to have her undergo a mechanical thrombectomy.       - Level 1 mechanical thrombectomy   - SBP 140-180         Leodis Binet, MD   [facility]   [address], [address]   [place], [place] [address]   Clinic: [phone]   Fax: [phone]   Cell: [phone]

## 2021-02-06 NOTE — ED Notes (Signed)
Patient turned over to me she is an 87 year old female who is last known well time was last night.  She actually stays at a assisted living facility.  Her son is here now.  Dr. Raynald Kemp shift has ended and the patient has been admitted pending thrombectomy.  She does have an M1 stenosis not occlusion per CTA.  I was notified by nursing of concern of worsening slurred speech.  I did go in and evaluate the patient and compared current and presenting stroke scores.  She really has hemiparesis on the left with left-sided facial droop and worsening slurred speech.  The only real difference I see from her initial stroke score and her current stroke score is some worsening slurred speech.  Patient is dysarthric.  She is able to speak however per nursing it is worse than when he got here.  Patient is ready to go upstairs for thrombectomy it will take about 20 minutes.  Logistically it would be absolutely impossible to get her transferred over to another facility and have faster intervention that it would be to get her upstairs.  Neurological Dr. Allison Quarry is on her way down to evaluate the patient.  We will monitor her here.  I did lower her head of the bed to about 5 degrees as well as we will initiate the IV fluids ordered by Dr. Raynald Kemp.  If needed we can put her on some pressors however current blood pressure is 135 and presenting blood pressure was 120.

## 2021-02-06 NOTE — ED Notes (Signed)
Rounded on patient and visitor.  Updated them on plan of care.

## 2021-02-06 NOTE — ED Notes (Signed)
MD Hsu entering room.

## 2021-02-06 NOTE — ED Notes (Signed)
MD Cobb at the bedside.

## 2021-02-06 NOTE — Progress Notes (Signed)
Initial Stroke Team Assessment   Presents to ED from assisted living after staff found that she had fallen out of bed at 0330 and noted to have L sided deficits, R gaze preference. LKW 1900.   Dr Raynald Kemp discussed with neurovascular-stroke 3 per Dr Allison Quarry

## 2021-02-06 NOTE — Progress Notes (Signed)
Chaplain Code Stroke Alert 3 Note    Start Visit: [phone]  End Visit: 0719    Chaplain responded to Code Stroke Alert 3 for  [name] [name], who is a 87 y.o.,female,     The Chaplain provided the following Interventions:  Provided crisis pastoral care and pastoral support.   Offered prayers on behalf for the patient.   Chart reviewed.    The following outcomes were achieved:  Prayer at bedside with patient and her son.  Patient waiting for procedure.    Assessment:  There are no spiritual or religious issues which require intervention at this time.     Plan:  Chaplains will continue to follow and will provide pastoral care on an as needed/requested basis.    Patrica Duel  Pastoral Care  [phone]

## 2021-02-06 NOTE — ED Notes (Signed)
Presents to ED from assisted living after staff found that she had fallen out of bed at 0330 and noted to have L sided deficits, R gaze preference. LKW 1900.

## 2021-02-06 NOTE — Consults (Signed)
NEUROLOGY CONSULT ATION    Patient: [name] [name] MRN: [id number]  SSN: [id number]    Date of Birth: [birth year]  Age: 87 y.o.  Sex: female      CONSULT DATE: 02/06/2021 3:11 PM    REQUESTING PHYSICIAN: Leodis Binet, MD    REASON FOR CONSULTATION: Left sided weakness    HPI:   The patient is a 87 y.o. female with history of hypertension, atrial fibrillation coagulation due to frequent falls presented with new onset of left-sided weakness and right gaze preference.  Patient was last known well when she went to bed last night.  She woke up in and fell out of bed with left-sided weakness and right gaze preference.  On arrival to ED, BP 121/81.  CT head was unremarkable for any acute intracranial abnormality.  CTP showed perfusion mismatch in the R MCA distribution suggestive of acute ischemia.  CTA showed distal right M1 occlusion.  She was seen by teleneurology but was out of the therapeutic window for IV tPA.  She was evaluated by Dr. Allison Quarry and underwent emergent mechanical thrombectomy with successful reperfusion.  No prior history of stroke or TIA.  Known history of atrial fibrillation but was not on any anticoagulation due to frequent falls.  No tobacco or alcohol use.    Past Medical History:   Diagnosis Date    Arthritis     Deep venous thrombosis (HCC)     Endocrine disease     hypothyroid    Hemochromatosis     Hypertension        Social History     Tobacco Use    Smoking status: Never    Smokeless tobacco: Never   Substance Use Topics    Alcohol use: No        History reviewed. No pertinent family history.    Allergies   Allergen Reactions    Shellfish Derived Hives    Sulfa (Sulfonamide Antibiotics) Hives       Current Facility-Administered Medications   Medication Dose Route Frequency    albuterol-ipratropium (DUO-NEB) 2.5 MG-0.5 MG/3 ML  3 mL Nebulization Q4H PRN    [Held by provider] furosemide (LASIX) tablet 20 mg  20 mg Oral DAILY    [Held by provider] hydrOXYchloroQUINE (PLAQUENIL) tablet 200 mg  200 mg  Oral BID    [Held by provider] lactulose (CHRONULAC) 10 gram/15 mL solution 30 mL  20 g Oral BID    [Held by provider] levothyroxine (SYNTHROID) tablet 125 mcg  125 mcg Oral ACB    [Held by provider] lisinopriL (PRINIVIL, ZESTRIL) tablet 10 mg  10 mg Oral DAILY    [Held by provider] metoprolol tartrate (LOPRESSOR) tablet 25 mg  25 mg Oral Q8H    [Held by provider] potassium chloride (K-DUR, KLOR-CON M20) SR tablet 40 mEq  40 mEq Oral DAILY    [Held by provider] rifAXIMin (XIFAXAN) tablet 550 mg  550 mg Oral BID    CALCIUM ELECTROLYTE REPLACEMENT PROTOCOL STANDARD DOSING  1 Each Other PRN    POTASSIUM ELECTROLYTE REPLACEMENT PROTOCOL STANDARD DOSING  1 Each Other PRN    MAGNESIUM ELECTROLYTE REPLACEMENT PROTOCOL STANDARD DOSING  1 Each Other PRN    NOREPINephrine (LEVOPHED) 8 mg in 0.9% NS infusion  0.5-16 mcg/min IntraVENous TITRATE    niCARdipine (CARDENE) 50 mg in 0.9% sodium chloride 250 mL infusion  0-15 mg/hr IntraVENous TITRATE    dextrose 5% - 0.45% NaCl with KCl 20 mEq/L infusion  60 mL/hr IntraVENous CONTINUOUS  ondansetron (ZOFRAN) injection 4 mg  4 mg IntraVENous Q6H PRN    acetaminophen (OFIRMEV) infusion 1,000 mg  1,000 mg IntraVENous Q8H    aspirin tablet 325 mg  325 mg Oral DAILY        Review Of Systems:  A ten system review of constitutional, cardiovascular, respiratory, musculoskeletal,   endocrine, skin, SHEENT, genitourinary, psychiatric and neurologic systems was   obtained and is unremarkable with the exception of the following: Left sided weakness, slurred speech.  No headache.    PHYSICAL EXAM:    Visit Vitals  BP 115/77   Pulse 73   Temp 97 ??F (36.1 ??C)   Resp 20   Ht 5\' 3"  (1.6 m)   Wt 72.4 kg (159 lb 11.2 oz)   SpO2 97%   BMI 28.29 kg/m??     CONSTITUTIONAL: This is an elderly female who is resting in bed comfortably and appears to be in no acute distress  PSYCHIATRIC: Normal mood and affect  HEENT: Normocephalic, atraumatic   NECK:  Supple,no cervical lymphadenopathy seen    LUNG:  Clear to auscultation. No rales or rhonchi.   ABDOMEN: Soft, non-tender  SKIN: Warm and moist.   MUSCULOSKELETAL: No deformity, no swelling, no increased warmth or tenderness over any of the joints    NEUROLOGICAL EXAM:  MENTAL STATUS: Awake and alert  Oriented to age, month and reason for hospitalization  Answers questions appropriately and follows all commands  Has normal fund of knowledge, attention, comprehension and insight  CN: R gaze deviation which can be overcome  PERRL. Extraocular movements intact.   No gross visual field deficits or nystagmus seen.  Left facial weakness +.  Facial sensation intact to light touch bilaterally.  MOTOR: Tone normal.  No rigidity or spasticity.  No atrophy.  No tremors, asterixis, myoclonus, dystonia or other abnormal movements noted  Left hemiparesis +.  Attempts to grip fingers on left but unable to raise limb against gravity.  Some movement in LLE but unable to raise limb against gravity.  SENSORY: Sensation to pinprick decreased in left upper extremity  SPEECH: Moderate to severe dysarthria  LANGUAGE: No aphasia.  Repetition intact.  Naming impaired.  Could not read due to lack of eyeglasses.  Reading, repetition and naming intact.  COORDINATION: No dysmetria on finger-nose-finger testing on right.  Did not attempt heel-knee-shin testing.  Could not attempt ataxia testing on left due to degree of weakness.    INATTENTION: No inattention to bilateral simultaneous visual or tactile stimuli  GAIT: deferred  NIHSS: 12      IMAGING:  Personally reviewed. Per HPI.      LABS:  CBC   Lab Results   Component Value Date/Time    WBC 8.8 02/06/2021 07:45 AM    RBC 3.94 02/06/2021 07:45 AM    HCT 36.4 (L) 02/06/2021 07:45 AM    MCV 92.4 02/06/2021 07:45 AM    MCH 31.0 02/06/2021 07:45 AM    MCHC 33.5 02/06/2021 07:45 AM    RDW 13.4 05/04/2012 09:52 AM      Basic Metabolic Profile   Lab Results   Component Value Date    NA 139 02/06/2021    K 3.9 02/06/2021    CL 108 (H) 02/06/2021     CO2 22 02/06/2021    BUN 12 02/06/2021    CREA 0.88 02/06/2021    GLU 143 (H) 02/06/2021    CA 9.2 02/06/2021    MG 1.9 11/10/2020  Lipid Panel   Lab Results   Component Value Date/Time    Cholesterol, total 113 09/06/2020 03:18 PM    HDL Cholesterol 48 09/06/2020 03:18 PM    LDL, calculated 50 09/06/2020 03:18 PM    Triglyceride 74 10/23/2020 12:11 AM    CHOL/HDL Ratio 2.4 09/06/2020 03:18 PM       Hemoglobin A1C   No results found for: HBA1CPOC       Coagulation   Lab Results   Component Value Date    INR 1.2 (H) 02/06/2021    APTT 31.5 08/27/2017        ASSESSMENT / PLAN:   87 y.o. female of hypertension, atrial fibrillation not on anticoagulation due to falls presented with acute onset of left-sided weakness due to R MCA stroke in the setting of Right M1 occlusion status post mechanical thrombectomy and successful revascularization.  Repeat CTA head shows widely patent R MCA without any thrombus.  Suspect cardioembolic stroke.  Patient will need to be on anticoagulation going forward.  Discussed with the patient and she expressed understanding.    -6 hour post procedure CT head pending  -MRI brain without contrast  - Echocardiogram   - Will await results of the CT head before starting antiplatelet therapy/anticoagulation  - Atorvastatin 40 mg p.o. daily  - Neurochecks every hour  - Telemetry  - BP parameters per neuro endovascular service, SBP to be maintained 100-140  -PT/OT/ST.  Anticipate ongoing rehab needs.  -Nonpharmacological DVT prophylaxis for now  Thank you for this consultation.  Neurology will continue to follow.    Steffanie Dunn, MD  02/06/2021, 3:11 PM

## 2021-02-06 NOTE — Progress Notes (Signed)
Progress  Notes by Hildred Alamin, MD at 02/06/21 1310                Author: Hildred Alamin, MD  Service: Neurosurgery  Author Type: Physician       Filed: 02/06/21 1313  Date of Service: 02/06/21 1310  Status: Signed          Editor: Hildred Alamin, MD (Physician)                        NEUROSURGERY PROGRESS NOTE          Patient: [name] [name]  MRN: [id number]   SSN: [id number]          Date of Birth: [birth year]   Age: 87 y.o.   Sex: female          EVENTS: Still waking up from anesthesia. Updated son, ICU. Questions answered to their level of satisfaction.       EXAM:       Vitals:             02/06/21 0750  02/06/21 0901  02/06/21 1144  02/06/21 1215           BP:    126/82  127/85  116/71     Pulse:    73  78  69     Resp:    _0 Temp:        (!) 96.3 ??F (35.7 ??C)     SpO2:      100%  93%     Weight:  72.4 kg (159 lb 11.2 oz)                 Height:                GEN: NAD, sleepy, but OEV   HEENT: NC, AT, neck supple, trachea midline   RESP: non-labored   GI: non-distended   SKIN: warm and moist   PSYCH: cooperative   MSK: Spontaneous movement in RUE/RLE. No movement in LUE/LLE   NEURO: Nodded to understanding, but had difficulty keeping eyes open. PERRL.          LABS:      Recent Results (from the past 24 hour(s))     CBC W/O DIFF          Collection Time: 02/06/21  4:15 AM         Result  Value  Ref Range            WBC  6.7  4.0 - 11.0 1000/mm3       RBC  4.08  3.60 - 5.20 M/uL       HGB  12.8 (L)  13.0 - 17.2 gm/dl       HCT  38.2  37.0 - 50.0 %       MCV  93.6  80.0 - 98.0 fL       MCH  31.4  25.4 - 34.6 pg       MCHC  33.5  30.0 - 36.0 gm/dl       PLATELET  191  140 - 450 1000/mm3       MPV  11.2 (H)  6.0 - 10.0 fL       RDW-SD  44.3  36.4 - 27.0         METABOLIC PANEL, BASIC          Collection Time: 02/06/21  4:15 AM         Result  Value  Ref Range            Potassium  3.7  3.5 - 5.1 mEq/L       Chloride  106  98 - 107 mEq/L       Sodium  139  136 - 145 mEq/L       CO2  21  20 - 31 mEq/L        Glucose  180 (H)  74 - 106 mg/dl       BUN  12  9 - 23 mg/dl       Creatinine  0.97  0.55 - 1.02 mg/dl       GFR est AA  >60.0          GFR est non-AA  58          Calcium  9.4  8.7 - 10.4 mg/dl       Anion gap  12  5 - 15 mmol/L       EKG, 12 LEAD, INITIAL          Collection Time: 02/06/21  4:45 AM         Result  Value  Ref Range            Ventricular Rate  77  BPM       QRS Duration  82  ms       Q-T Interval  414  ms       QTC Calculation (Bezet)  468  ms       Calculated R Axis  -73  degrees       Calculated T Axis  13  degrees       Diagnosis                 Atrial fibrillation   Left axis deviation   Low voltage QRS   Septal infarct , age undetermined   Abnormal ECG   When compared with ECG of 31-Oct-2020 08:18,   QRS voltage has decreased   Septal infarct is now present   Non-specific change in ST segment in Lateral leads   Nonspecific T wave abnormality no longer evident in Lateral leads   Confirmed by Posey Pronto, [name], [place] ([id number]) on 02/06/2021 8:42:58 AM          ASPIRIN TEST          Collection Time: 02/06/21  6:35 AM         Result  Value  Ref Range            Aspirin test  651  620 - 672 ARU       PLATELET FUNCTION, VERIFY NOW P2Y12          Collection Time: 02/06/21  6:35 AM         Result  Value  Ref Range            P2Y12 ASSAY  235  194 - 418 PRU       BLOOD TYPE, (ABO+RH)          Collection Time: 02/06/21  6:35 AM         Result  Value  Ref Range            ABO/Rh(D)  O Rh Positive          ANTIBODY SCREEN          Collection Time: 02/06/21  6:35 AM  Result  Value  Ref Range            Antibody screen  NEG          CBC WITH AUTOMATED DIFF          Collection Time: 02/06/21  7:45 AM         Result  Value  Ref Range            WBC  8.8  4.0 - 11.0 1000/mm3       RBC  3.94  3.60 - 5.20 M/uL       HGB  12.2 (L)  13.0 - 17.2 gm/dl       HCT  36.4 (L)  37.0 - 50.0 %       MCV  92.4  80.0 - 98.0 fL       MCH  31.0  25.4 - 34.6 pg       MCHC  33.5  30.0 - 36.0 gm/dl       PLATELET  191  140 - 450  1000/mm3       MPV  10.8 (H)  6.0 - 10.0 fL       RDW-SD  44.3  36.4 - 46.3         NRBC  0  0 - 0         IMMATURE GRANULOCYTES  0.5  0.0 - 3.0 %       NEUTROPHILS  73.2 (H)  34 - 64 %       LYMPHOCYTES  16.3 (L)  28 - 48 %       MONOCYTES  9.4  1 - 13 %       EOSINOPHILS  0.3  0 - 5 %       BASOPHILS  0.3  0 - 3 %       METABOLIC PANEL, COMPREHENSIVE          Collection Time: 02/06/21  7:45 AM         Result  Value  Ref Range            Potassium  3.9  3.5 - 5.1 mEq/L       Chloride  108 (H)  98 - 107 mEq/L       Sodium  139  136 - 145 mEq/L       CO2  22  20 - 31 mEq/L       Glucose  143 (H)  74 - 106 mg/dl       BUN  12  9 - 23 mg/dl       Creatinine  0.88  0.55 - 1.02 mg/dl       GFR est AA  >60.0          GFR est non-AA  >60          Calcium  9.2  8.7 - 10.4 mg/dl       Anion gap  10  5 - 15 mmol/L       AST (SGOT)  21.0  0.0 - 33.9 U/L       ALT (SGPT)  9 (L)  10 - 49 U/L       Alk. phosphatase  106  46 - 116 U/L       Bilirubin, total  0.80  0.30 - 1.20 mg/dl       Protein, total  6.0  5.7 - 8.2 gm/dl       Albumin  3.0 (  L)  3.4 - 5.0 gm/dl       PROTHROMBIN TIME + INR          Collection Time: 02/06/21  7:45 AM         Result  Value  Ref Range            Prothrombin time  14.6 (H)  10.2 - 12.9 seconds       INR  1.2 (H)  0.1 - 1.1         FIBRINOGEN          Collection Time: 02/06/21  7:45 AM         Result  Value  Ref Range            Fibrinogen  337  220 - 397 mg/dl       GLUCOSE, POC          Collection Time: 02/06/21 12:20 PM         Result  Value  Ref Range            Glucose (POC)  142 (H)  65 - 105 mg/dL           MEDS:     Current Facility-Administered Medications          Medication  Dose  Route  Frequency           ?  albuterol-ipratropium (DUO-NEB) 2.5 MG-0.5 MG/3 ML   3 mL  Nebulization  Q4H PRN     ?  [Held by provider] furosemide (LASIX) tablet 20 mg   20 mg  Oral  DAILY           ?  [Held by provider] hydrOXYchloroQUINE (PLAQUENIL) tablet 200 mg   200 mg  Oral  BID           ?  [Held by  provider] lactulose (CHRONULAC) 10 gram/15 mL solution 30 mL   20 g  Oral  BID     ?  [Held by provider] levothyroxine (SYNTHROID) tablet 125 mcg   125 mcg  Oral  ACB     ?  [Held by provider] lisinopriL (PRINIVIL, ZESTRIL) tablet 10 mg   10 mg  Oral  DAILY     ?  [Held by provider] metoprolol tartrate (LOPRESSOR) tablet 25 mg   25 mg  Oral  Q8H     ?  [Held by provider] potassium chloride (K-DUR, KLOR-CON M20) SR tablet 40 mEq   40 mEq  Oral  DAILY     ?  [Held by provider] rifAXIMin (XIFAXAN) tablet 550 mg   550 mg  Oral  BID     ?  CALCIUM ELECTROLYTE REPLACEMENT PROTOCOL STANDARD DOSING   1 Each  Other  PRN     ?  POTASSIUM ELECTROLYTE REPLACEMENT PROTOCOL STANDARD DOSING   1 Each  Other  PRN     ?  MAGNESIUM ELECTROLYTE REPLACEMENT PROTOCOL STANDARD DOSING   1 Each  Other  PRN     ?  acetaminophen (TYLENOL) tablet 650 mg   650 mg  Oral  Q6H PRN     ?  NOREPINephrine (LEVOPHED) 8 mg in 0.9% NS 251m infusion   0.5-16 mcg/min  IntraVENous  TITRATE     ?  niCARdipine (CARDENE) 50 mg in 0.9% sodium chloride 250 mL infusion   0-15 mg/hr  IntraVENous  TITRATE           ?  dextrose 5% - 0.45% NaCl with KCl  20 mEq/L infusion   60 mL/hr  IntraVENous  CONTINUOUS           ?  ondansetron (ZOFRAN) injection 4 mg   4 mg  IntraVENous  Q6H PRN           I&Os:      Intake/Output Summary (Last 24 hours) at 02/06/2021 1310   Last data filed at 02/06/2021 1215     Gross per 24 hour        Intake  450 ml        Output  300 ml        Net  150 ml           ASSESSMENT / PLAN:     POD0 Mechanical thrombectomy R M1 occlusion with TICI 3 reperfusion      - Flat x 4 hours, groin and distal pulse checks   - Monitor neuro exam   - CT head without contrast in 6 hours   - If no hemorrhage, start ASA 325 mg ASAP   - SBP 100-140, allowing to autoregulate as much as possible   - Neurology consult   - Recommend starting anticoagulation based on history of stroke / atrial fibrillation (defer to neurology / ICU / cardiology)   - PT/OT/SLP/OOB  TID   - DVT prophylaxis: SCDs   - Dispo: ICU      Hildred Alamin, MD   [facility]   [address] [place] [address]   Office: [phone]   Fax: [phone]   Cell: [phone]

## 2021-02-06 NOTE — Progress Notes (Signed)
Chaplain Stroke Alert 3 Note    Start Visit: 08:30  End Visit: 08:45    Chaplain responded to Stroke Alert 3 for  [name] [name], who is a 87 y.o.,female,     The Chaplain provided the following Interventions:  Patient's son present.  Received handoff of pastoral care from [facility] chaplain.  Provided crisis pastoral care and pastoral support.   Chart reviewed.    The following outcomes were achieved:  Offered assurance of prayers.  Patient's son expressed appreciation but indicated that no additional pastoral care needed at this time.    Assessment:  There are no spiritual or religious issues which require intervention at this time.     Plan:  Chaplains will continue to follow and will provide pastoral care on an as needed/requested basis.    Laurell Josephs   Chaplain  Pastoral Care  [phone]

## 2021-02-06 NOTE — H&P (Signed)
[facility]  History and Physical  NAME:  [name] [name], [name] [name]  SEX:   F  ADMIT: 02/06/2021  DOB: April 26, 1933  MR#    [id number]  ROOM:  PL  ACCT#  [id number]    I hereby certify this patient for admission based upon medical necessity as noted below:    cc: Zaide Mcclenahan DO;   Leodis Binet MD      cc:  Judie Petit. Allena Katz, [name]     CHIEF COMPLAINT:    Left hemiparesis and dysarthric speech      HISTORY OF CHIEF COMPLAINT:    The patient is an 87 year old female with a history of atrial fibrillation, not on anticoagulation due to very high fall risk cirrhosis of the liver with hepatic encephalopathy, ptosis, possible Lewy body dementia and hypertension, whose last known well time was 7:30 last night.  The patient with family for the holidays.  She fell out of bed this morning and was ___ left sided weakness and left facial droop.  The patient was brought to the ER for evaluation.  The patient on CTA of her head and neck was found to have severe stenosis of the right distal M1 MCA was felt to have a perfusion mismatch in the right MCA distribution suggesting infarct and ischemic penumbra per radiology and a CT of the head, which showed no abnormality.     The patient was seen in consult by tele-neurology, felt to be outside the window for consideration of tPA.  Case was discussed by tele-neurology with Dr. Allison Quarry and a decision was made to pursue possible thrombectomy.  Currently, she is in the interventional neurology suite.     PAST MEDICAL HISTORY:    1.  Chronic diastolic congestive heart failure.  Echocardiogram from 10/31/2020 showed an ejection fraction of 75% with an estimated right ventricular systolic pressure of 43 mmHg.  2.  Chronic atrial fibrillation with new onset in August of this year.  The patient was not felt to be a candidate for ___ due to a very high fall risk with a history of multiple falls and poor situational awareness with her hepatic encephalopathy.  3.  Hypertension.  4.  History of a subdural  hematoma in 08/2017, not requiring surgery after a mechanical fall.  5.  History of left hip surgery, right hip surgery, left knee replacement, surgical repair of a left femur fracture.  6.  Cirrhosis of the liver, felt to be secondary to hemochromatosis.  Additionally, the patient had been on methotrexate for multiple years for rheumatoid arthritis.  Hepatic encephalopathy over the last 9 months.  7.  Possible Lewy body dementia with interventions and delusions that do not always follow elevated ammonia levels.  8.  Recent history of periumbilical incarcerated hernia with transient partial small-bowel obstruction in August of this year.  Small-bowel obstruction, resolved with conservative management during that hospitalization.  9.  Hypothyroidism, on replacement therapy.  10.  Rheumatoid arthritis.  11.  Hemochromatosis requiring periodic phlebotomies about 2 per year.  12.  Status post kidney stone extraction, thyroid surgery, total abdominal hysterectomy in 1961, chronic bilateral salpingo-oophorectomy, 3 C-sections, partial colectomy, negative breast biopsy on the left, right shoulder surgery and right carpal tunnel surgery.     CURRENT MEDICATIONS:    Please see med recon.     ALLERGIES:    SULFA AND SHELLFISH.     SOCIAL HISTORY:    She is a widow and does not drink.  She is a resident of an assisted  living facility over approximately the last 5 years.     FAMILY HISTORY:    Father died of lung cancer.  She has 2 sisters who had breast cancer.     IMMUNIZATIONS:    She has received the Prevnar 13 and Pneumovax, recent flu and COVID immunizations will need to be confirmed.  She had COVID-19 ____ year.     REVIEW OF SYSTEMS AND PHYSICAL EXAMINATION:     Deferred at the moment as the patient is currently undergoing her procedure.     ASSESSMENT AND PLAN:    1.  Acute cerebral artery cerebrovascular accident with left hemiparesis.  The patient pursuing potential for thrombectomy.  2.  Chronic atrial fibrillation.   The patient has not been on anticoagulation due to high fall risk with a history of multiple falls, orthopedic surgeries and altered judgment with her hepatic encephalopathy.  3.  Chronic diastolic congestive heart failure secondary to arrhythmia.  4.  Cirrhosis of the liver secondary to hemochromatosis with waxing and waning hepatic encephalopathy.   5.  Concurrent Lewy body.  6.  History of subdural hematoma on June ___, not requiring surgery.       The patient is going to be admitted to the intensive care unit post-procedure.  Will follow tele-neurology recommendations regarding blood pressure management.  Reassessing her lipid profile.  Decisions regarding ongoing anticoagulation pending.  Check echocardiogram.      ___________________  Bernadene Bell Kymari Lollis DO   Dictated [id number] L. Jazaria Jarecki, DO  SD  D: 02/06/2021 11:15:06  T: 02/06/2021 12:20:40  [id number]

## 2021-02-07 ENCOUNTER — Inpatient Hospital Stay: Payer: MEDICARE | Primary: Internal Medicine

## 2021-02-07 ENCOUNTER — Inpatient Hospital Stay: Admit: 2021-02-07 | Payer: MEDICARE | Primary: Internal Medicine

## 2021-02-07 LAB — METABOLIC PANEL, COMPREHENSIVE
ALT (SGPT): 9 U/L — ABNORMAL LOW (ref 10–49)
AST (SGOT): 19 U/L (ref 0.0–33.9)
Albumin: 1.5 gm/dl — ABNORMAL LOW (ref 3.4–5.0)
Alk. phosphatase: 70 U/L (ref 46–116)
Anion gap: 8 mmol/L (ref 5–15)
BUN: 9 mg/dl (ref 9–23)
Bilirubin, total: 0.9 mg/dl (ref 0.30–1.20)
CO2: 22 mEq/L (ref 20–31)
Calcium: 8.4 mg/dl — ABNORMAL LOW (ref 8.7–10.4)
Chloride: 111 mEq/L — ABNORMAL HIGH (ref 98–107)
Creatinine: 0.67 mg/dl (ref 0.55–1.02)
GFR est AA: >60
GFR est non-AA: >60
Glucose: 118 mg/dl — ABNORMAL HIGH (ref 74–106)
Potassium: 3.8 mEq/L (ref 3.5–5.1)
Protein, total: 4.8 gm/dl — ABNORMAL LOW (ref 5.7–8.2)
Sodium: 141 mEq/L (ref 136–145)

## 2021-02-07 LAB — CBC WITH AUTOMATED DIFF
BASOPHILS: 0.7 % (ref 0–3)
EOSINOPHILS: 1.4 % (ref 0–5)
HCT: 28.6 % — ABNORMAL LOW (ref 37.0–50.0)
HGB: 9.4 gm/dl — ABNORMAL LOW (ref 13.0–17.2)
IMMATURE GRANULOCYTES: 0.2 % (ref 0.0–3.0)
LYMPHOCYTES: 24.2 % — ABNORMAL LOW (ref 28–48)
MCH: 31 pg (ref 25.4–34.6)
MCHC: 32.9 gm/dl (ref 30.0–36.0)
MCV: 94.4 fL (ref 80.0–98.0)
MONOCYTES: 9.4 % (ref 1–13)
MPV: 10.8 fL — ABNORMAL HIGH (ref 6.0–10.0)
NEUTROPHILS: 64.1 % — ABNORMAL HIGH (ref 34–64)
NRBC: 0 (ref 0–0)
PLATELET: 160 10*3/uL (ref 140–450)
RBC: 3.03 M/uL — ABNORMAL LOW (ref 3.60–5.20)
RDW-SD: 45.7 (ref 36.4–46.3)
WBC: 8.7 10*3/uL (ref 4.0–11.0)

## 2021-02-07 LAB — GLUCOSE, POC
Glucose (POC): 135 mg/dL — ABNORMAL HIGH (ref 65–105)
Glucose (POC): 132 mg/dL — ABNORMAL HIGH (ref 65–105)
Glucose (POC): 121 mg/dL — ABNORMAL HIGH (ref 65–105)
Glucose (POC): 149 mg/dL — ABNORMAL HIGH (ref 65–105)

## 2021-02-07 LAB — LIPID PANEL
CHOL/HDL Ratio: 3.2 Ratio (ref 0.0–4.4)
Chol/HDL Ratio: 3.2 Ratio (ref 0.0–4.4)
Cholesterol, Total: 80 mg/dl (ref 0–199)
Cholesterol, total: 80 mg/dl (ref 0–199)
HDL Cholesterol: 25 mg/dl — ABNORMAL LOW (ref 40–60)
HDL: 25 mg/dl — ABNORMAL LOW (ref 40–60)
LDL Calculated: 40 mg/dl (ref 0–130)
LDL, calculated: 40 mg/dl (ref 0–130)
Triglyceride: 75 mg/dl (ref 0–150)
Triglycerides: 75 mg/dl (ref 0–150)

## 2021-02-07 LAB — MAGNESIUM
Magnesium: 1.6 mg/dL (ref 1.6–2.6)
Magnesium: 1.6 mg/dL (ref 1.6–2.6)

## 2021-02-07 LAB — AMMONIA
Ammonia, plasma: 35 umol/L — ABNORMAL HIGH (ref 11.2–31.7)
Ammonia: 35 umol/L — ABNORMAL HIGH (ref 11.2–31.7)

## 2021-02-07 LAB — HEMOGLOBIN A1C W/O EAG
Hemoglobin A1C: 5.3 % (ref 3.8–5.6)
Hemoglobin A1c: 5.3 % (ref 3.8–5.6)

## 2021-02-07 LAB — BILIRUBIN, DIRECT
Bilirubin, Direct: 0.4 mg/dl — ABNORMAL HIGH (ref 0.0–0.3)
Bilirubin, direct: 0.4 mg/dl — ABNORMAL HIGH (ref 0.0–0.3)

## 2021-02-07 LAB — ECHOCARDIOGRAM 2D W DOPPLER W CONTRAST LIMITED: Left Ventricular Ejection Fraction: 73

## 2021-02-07 LAB — COMPREHENSIVE METABOLIC PANEL
ALT: 9 U/L — ABNORMAL LOW (ref 10–49)
AST: 19 U/L (ref 0.0–33.9)
Albumin: 1.5 gm/dl — ABNORMAL LOW (ref 3.4–5.0)
Alkaline Phosphatase: 70 U/L (ref 46–116)
Anion Gap: 8 mmol/L (ref 5–15)
BUN: 9 mg/dl (ref 9–23)
CO2: 22 mEq/L (ref 20–31)
Calcium: 8.4 mg/dl — ABNORMAL LOW (ref 8.7–10.4)
Chloride: 111 mEq/L — ABNORMAL HIGH (ref 98–107)
Creatinine: 0.67 mg/dl (ref 0.55–1.02)
EGFR IF NonAfrican American: >60
GFR African American: >60
Glucose: 118 mg/dl — ABNORMAL HIGH (ref 74–106)
Potassium: 3.8 mEq/L (ref 3.5–5.1)
Sodium: 141 mEq/L (ref 136–145)
Total Bilirubin: 0.9 mg/dl (ref 0.30–1.20)
Total Protein: 4.8 gm/dl — ABNORMAL LOW (ref 5.7–8.2)

## 2021-02-07 LAB — CBC WITH AUTO DIFFERENTIAL
Basophils %: 0.7 % (ref 0–3)
Eosinophils %: 1.4 % (ref 0–5)
Hematocrit: 28.6 % — ABNORMAL LOW (ref 37.0–50.0)
Hemoglobin: 9.4 gm/dl — ABNORMAL LOW (ref 13.0–17.2)
Immature Granulocytes: 0.2 % (ref 0.0–3.0)
Lymphocytes %: 24.2 % — ABNORMAL LOW (ref 28–48)
MCH: 31 pg (ref 25.4–34.6)
MCHC: 32.9 gm/dl (ref 30.0–36.0)
MCV: 94.4 fL (ref 80.0–98.0)
MPV: 10.8 fL — ABNORMAL HIGH (ref 6.0–10.0)
Monocytes %: 9.4 % (ref 1–13)
Neutrophils %: 64.1 % — ABNORMAL HIGH (ref 34–64)
Nucleated RBCs: 0 (ref 0–0)
Platelets: 160 10*3/uL (ref 140–450)
RBC: 3.03 M/uL — ABNORMAL LOW (ref 3.60–5.20)
RDW-SD: 45.7 (ref 36.4–46.3)
WBC: 8.7 10*3/uL (ref 4.0–11.0)

## 2021-02-07 LAB — POCT GLUCOSE
POC Glucose: 135 mg/dL — ABNORMAL HIGH (ref 65–105)
POC Glucose: 132 mg/dL — ABNORMAL HIGH (ref 65–105)
POC Glucose: 121 mg/dL — ABNORMAL HIGH (ref 65–105)
POC Glucose: 149 mg/dL — ABNORMAL HIGH (ref 65–105)

## 2021-02-07 MED ORDER — SALINE PERIPHERAL FLUSH PRN
INTRAMUSCULAR | Status: DC | PRN
Start: 2021-02-07 — End: 2021-02-19
  Administered 2021-02-07: 16:00:00 via INTRAVENOUS

## 2021-02-07 MED ORDER — APIXABAN 5 MG TABLET
5 mg | Freq: Two times a day (BID) | ORAL | Status: AC
Start: 2021-02-07 — End: 2021-02-19
  Administered 2021-02-08 – 2021-02-10 (×6): via ORAL

## 2021-02-07 MED ORDER — PERFLUTREN LIPID MICROSPHERES 1.1 MG/ML IV
1.1 mg/mL | INTRAVENOUS | Status: AC | PRN
Start: 2021-02-07 — End: 2021-02-07
  Administered 2021-02-07: 16:00:00 via INTRAVENOUS

## 2021-02-07 MED FILL — DEFINITY 1.1 MG/ML INTRAVENOUS SUSPENSION: 1.1 mg/mL | INTRAVENOUS | Qty: 1.3

## 2021-02-07 MED FILL — ASPIRIN 300 MG RECTAL SUPPOSITORY: 300 mg | RECTAL | Qty: 1

## 2021-02-07 MED FILL — ACETAMINOPHEN 1,000 MG/100 ML (10 MG/ML) IV: 1000 mg/100 mL (10 mg/mL) | INTRAVENOUS | Qty: 100

## 2021-02-07 NOTE — Progress Notes (Signed)
 SPEECH LANGUAGE PATHOLOGY   BEDSIDE DYSPHAGIA EVALUATION     Patient: [name] [name] (87 y.o. female), 1933-04-11  Room: 4322/4322  Primary Diagnosis: Stroke [facility]) [I63.9]  CVA (cerebral vascular accident) (HCC) [I63.9]  Atrial fibrillation (HCC) [I48.91]    Procedure(s) (LRB):  ANGIOGRAPHY CEREBRAL INTRACRNIAL W ANESTHESIA (63771) (N/A) 1 Day Post-Op  PMH:   Past Medical History:   Diagnosis Date    Arthritis     Deep venous thrombosis (HCC)     Endocrine disease     hypothyroid    Hemochromatosis     Hypertension        Isolation:  There are currently no Active Isolations       MDRO: No current active infections  PPE: surgical mask, gloves  Precautions:  universal, aspiration, and fall    Date: 02/07/2021   Start Time:  1010 End Time:  1019     ASSESSMENT:   Orientation: Pt awake, alert    Respiratory Status: RA  Evaluation: Bedside swallowing evaluation completed at bedside due to being more alert and refusing NG tube. Patient known to SLP services with previous MBS in 10/2020 showing aspiration with thin liquids. Patient with left sided facial droop and weakness. Patient consumed thin liquids with intermittent throat clearing with increased trials. Patient consumed puree and nectar thick liquids without any overt clinical signs or symptoms suggestive of penetration or aspiration. Patient without coughing or vocal changes. SLP provided education to patient and family about safe swallowing. Recommend IDDSI-4 Puree diet with IDDSI-2 Nectar thick liquids. Meds crushed with puree. Strict aspiration precautions with supervision for PO. POC discussed with patient, family, RN, and Pulmonary team who placed orders. SLP will follow accordingly.    Functional Oral Intake Scale: Level 5: Total oral diet of multiple consistencies but requiring special preparation or compensations     PLAN/ RECOMMENDATIONS:                 Diet Recommendations: initiate IDDSI 4- puree, IDDSI 2 mildly thick (nectar)   Compensatory Swallow  Strategies: HOB as upright as possible per neuro recs, SMALL, SINGLE sips, small bites/ sip, slow rate of eating/ feeding, alternate solids/ liquids, meticulous oral care at least 3x/ day, basic compensatory swallow strategies   Medication Administration: crushed in puree   Aspiration Precautions: 1:1 assistance, meticulous oral care, suction set up  Risk(s) for Aspiration: history of dysphagia, acute CVA, brain lesions, head injury     Therapy Recommendations: pt would benefit from initiation of SLP for dysphagia 3-5x/ week to address goals per POC    Therapy Prognosis: good    Discharge Recommendations: SNF  Other Recommended Services: OT, PT, Dietitian     SUBJECTIVE:    Patient Stated: hi.   Pain Prior to Treatment: no pain reported  Pain Following Treatment: no pain reported     Diet Prior to Admission: IDDSI 7- regular, IDDSI 0- thin  Current Diet: NPO     OBJECTIVE:   Position: HOB 45*  Oral Motor Assessment:  Left sided facial droop  Dentition: natural   Voice:  WFL  Consistencies: thin and mildly thick (nectar) via cup, puree (IDDSI 4)  Oral Phase: WFL  Pharyngeal Phase: throat clear with thin liquids  Esophageal Phase: wfl for b/s swallow eval    Safety: Following session, pt in bed, lowest position, HOB 45*, 4 side rails up, bed alarm on   Modified Rankin Score (MRS): Defer to OT/ PT    EDUCATION:   Education Provided:  POC, deficits, and diet recommendations  Individual Educated: patient and family  Comprehension: pt communicated comprehension and family communicated comprehension  Staff Education: Educated MD, NP, and Nurse to POC and diet recommendations.       Thank you for this referral,  Camie Pickles MA-CCC-SLP  Speech Pathologist   Office [phone]

## 2021-02-07 NOTE — Progress Notes (Signed)
Problem: Dysphagia (Adult)  Goal: *Acute Goals and Plan of Care (Insert Text)  Description: Reassessment due: 02/13/2021  Goals:  Patient will:  1. Tolerate recommended diet with </= 1 overt/ soft/ silent s/s aspiration or distress b/s for adequate po nutrition/ hydration  2. Tolerate diet upgrades with </= 1 overt/ soft/ silent s/s aspiration or distress b/s for adequate po nutrition/ hydration    Outcome: Progressing Towards Goal

## 2021-02-07 NOTE — Progress Notes (Signed)
[facility]    ICU Daily Progress Note    Patient: [name] [name]               Sex: female          DOA: 02/06/2021  Date of Birth:  1933/03/25       Age:  87 y.o.        LOS:  LOS: 1 day        Code Status: DNR        [x] I have reviewed the flowsheet and previous day???s notes. Events, vitals, medications and notes from last 24 hours reviewed.     ASSESSMENT:   - Acute right middle cerebral CVA. CT Perf: Perfusion mismatch in the right MCA distribution suggestive of acute infarct and ischemic penumbra. CTA head and neck 02/06/2021:  Severe stenosis of the right distal M1 MCA  s/p thrombectomy and angiogram by Dr. 02/08/2021 02/06/21. CT head and neck: Status post right MCA thrombectomy. Right MCA currently remains widely 02/06/2021.  patent.   - Atrial fibrillation not on AC due to multiple falls resulting in hx of 02/08/2021)  - Hx of chronic diastolic HF ECHO : EF 75%< LV NGE(9528, S/PW 9/10, LAVI 28, Mild MR, Mod TR, RVSP 43 10/31/2020  - Hx of COVID in August 2022   - Hx of Hypertension  - Hx of RA on methotrexate and plaquenil   - Hx of hemochromatosis with cirrhosis  - Hx of thyroid disorder  - Hx of urinary retention -takes flomax at home  - Hx of SBO from periumbical hernia in 08/22    PLAN:   - Currently on RA. Supplement O2 to keep SpO2 > 92 %.  - Continue D5 1/2 ns with 20 KCL at 60 ml/hr  - Neurologic and vascular checks, NIH scoring, and pupillometry readings per ICU protocol. Repeat head CT STAT with any acute neurological change.  - Blood pressure parameters: Keep MAP >65, SBP goal between 100- 140. May use Neo-Synephrine or Cardene as necessary.  - MRI   - Echo  - UA  - Neuro checks q 1 hour  - HOB 30 degrees  - Currently on no antibiotics. Will monitor for SIRS or sepsis.  - Avoid nephrotoxins. Renally dose medications. Monitor urine output. Daily weights.  - Electrolyte replacement per protocol. Goal K > 4, Ph > 3, and Mg > 2   - Glucomander per protocol  - Dysphagia  diet  - SUP: none  - DVT prophylaxis: SCDS, AC on hold  - BMP and CBC in the AM, mag, and phos in AM.  - DNR  - Further recommendations will be based on the patient's response to recommended treatment and results of the investigation ordered    My assessment, plan of care, findings, medications, side effects etc were discussed with:  [x] nursing [] PT/OT    [] respiratory therapy [x] Dr.Neya Creegan   [] family [x] Patient       [x] Total critical care time exclusive of procedures 40 minutes with complex decision making performed and > 50% time spent in face to face consultation.    Interval HISTORY     Interval History/Events:  Endo at the bedside, pt refusing dobbhoff. SLP consulted and patient upgraded to dysphagia diet.               ROS     Review of Systems   Constitutional: Negative.    HENT: Negative.     Eyes: Negative.    Respiratory:  Negative for shortness of breath and wheezing.    Cardiovascular: Negative.    Gastrointestinal: Negative.    Endocrine: Negative.    Genitourinary: Negative.    Musculoskeletal: Negative.    Skin: Negative.    Allergic/Immunologic: Negative.    Neurological:  Positive for weakness.        Left upper/lower extremities   Hematological: Negative.    Psychiatric/Behavioral: Negative.       Physical Exam   Non-sedated  GENERAL: Awake, alert, agitated  HEENT: Oral mucosa moist. No thrush.   NECK: Supple. Trachea midline.   RESPIRATORY: Bilateral BS present, decreased at bases. No rales or rhonchi.   CARDIOVASCULAR: S1 and S2 present. No murmur, rub, or thrill.   ABDOMEN: Soft and nontender with positive bowel sounds. No organomegaly.   NEUROLOGICAL: Conscious, no focal weakness.      Imaging:  CT Results (most recent):  Results from Hospital Encounter encounter on 02/06/21    CTA NECK    Narrative  CTA HEAD, CTA NECK    INDICATION: headache. Status post recent right MCA thrombectomy.    COMPARISON: CTA head/neck same day    TECHNIQUE: CTA of the head and neck was performed with  intravenous  contrast.  Multiplanar two-dimensional and maximum intensity projection reformats performed  and reviewed. To expedite the results of stroke protocol patients, volumetric  3-D imaging was not included at the time of dictation.    All CT exams at this facility use one or more dose reduction techniques  including automatic exposure control, mA/kV adjustment per patient's size, or  iterative reconstruction technique.    CAROTID STENOSIS REFERENCE USING NASCET CRITERIA    % Stenosis = (1 - narrowest diameter/diameter of distal artery) x100  Mild: < 50% stenosis  Moderate: 50-69% stenosis.  Severe: 70-94% stenosis.  Near occlusion: 95-99% stenosis.  Occluded: 100% stenosis    FINDINGS:    Head Vasculature: There has been interval thrombectomy of the right MCA. The  right MCA currently remains patent and perfuses the right MCA territory as  expected, grossly symmetrical compared to the left MCA territory. Left MCA  remains patent. Anterior and posterior cerebral arteries remain patent.  Vertebrobasilar system is patent. No discrete aneurysm or AVM.    Neck Vasculature: There is no hemodynamically significant stenosis within the  carotid or vertebral arteries. There is no evidence of dissection.    Brain: No findings to suggest acute territorial infarction. No parenchymal  hemorrhage. Mild generalized cerebral atrophy and periventricular chronic  microvascular changes.    Other: Some small volume gas within several venous structures involving the  right face and neck, likely secondary to recent procedure.    Upper lungs: Trace bilateral pleural effusions. Ascending aortic ectasia  measuring up to 4.5 cm.    Impression  IMPRESSION:    1.  Status post right MCA thrombectomy. Right MCA currently remains widely  patent.  2.  No large vessel occlusion in the head or neck. No evidence of dissection.  3.  No parenchymal hemorrhage or evidence of large territorial infarction.    Electronically signed by: Linus Galas, MD  02/06/2021 3:22 PM EST   Personally reviewed.      Dragon medical dictation software was used for portions of this report. Unintended voice transcription errors may have occurred.    Servando Snare, FNP-BC  Critical Care Nurse Practitioner  Pager: [phone]  February 07, 2021  7:54 AM        [facility]  I have reviewed [name] [name]'s  chart. I saw and examined the patient, personally performed the critical or key portions of the service and discussed the management with the treatment team. I reviewed the  PA's Sandra Cockayne, PA) note and concur with the history, findings and plan of care, which include's my edits and my assessment and plan. Laboratory, hemodynamic and radiological data were reviewed.      Assessment:  Acute right MCA CVA S/P mechanical thrombectomy on 02/06/21. Follow up CTA head demonstrated no large vessel occlusion. No large infraction   Known history of chronic A. fib not on anticoagulation due to multiple falls resulting subdural hematoma in 2019  History of chronic diastolic congestive heart failure.  Last echo demonstrated LVEF around 70% and RVSP 43  History of COVID infection in August 2022  History of hypertension  History of rheumatoid arthritis on methotrexate and Plaquenil  History of hemochromatosis with cirrhosis  History of thyroid cancer  History of urinary retention takes Flomax  History of small bowel obstruction due to periumbilical hernia in August 2022  Anemia, current Hb 9.4         Plan:  On RA. May consider supplemental oxygen. Target oxygen saturation >90%  Neurochecks as per ICU protocol.  Keep head of the bed elevated.  If there is any acute changes obtain stat CT of the head without contrast  Target SBP between 100 mg  to 140 mm hg   Awaiting MRI of brain   Follow SLP recommendation: Puree and nectar thick liquid. Start oral home medication.  Electrolyte replacement as per protocol  Management of hyperglycemia as per  Glucomander protocol.  DVT prophylaxis: Eliquis 5 mg twice a day. Follow Hb closely   DNR CODE STATUS        Manoj D. Allena Katz, MD, MBA   Pulmonary and Critical Care Medicine

## 2021-02-07 NOTE — Progress Notes (Signed)
SPEECH- LANGUAGE PATHOLOGY NOTE   Patient: [name] [name] (87 y.o. female) April 08, 1933  Room: 4322/4322  Primary Diagnosis: Stroke [facility]) [I63.9]  CVA (cerebral vascular accident) (HCC) [I63.9]  Atrial fibrillation (HCC) [I48.91]    Procedure(s) (LRB):  ANGIOGRAPHY CEREBRAL INTRACRNIAL W ANESTHESIA (51884) (N/A) 1 Day Post-Op  PMH:   Past Medical History:   Diagnosis Date    Arthritis     Deep venous thrombosis (HCC)     Endocrine disease     hypothyroid    Hemochromatosis     Hypertension        Date: 02/07/2021     Attempted dysphagia eval, unable to complete 2* pt unable to arouse adequately. Patient only able to stay awake for a few seconds at a time before falling back to sleep. Patient having feeding tube placed by GI today per RN.  SLP will continue to follow per POC as appropriate.     Thank you,  Sandford Craze MA-CCC-SLP  Speech Pathologist   Office [phone]

## 2021-02-07 NOTE — Progress Notes (Signed)
Cm Following    Called son per req.     Concerned about benefits/ coverage for SNF. States mom was at [facility] in 10/2020 for 21 days.     Lives at [facility].     Was open to PT/OT services there prior to admission      Phone: [phone]. No answer - will follow up.     Followed up w SW. No active Medicaid at this time.

## 2021-02-07 NOTE — Progress Notes (Signed)
Bedside shift change report given to Sondra Barges, RN  (oncoming nurse) by Nelva Bush (offgoing nurse). Report included the following information SBAR, Kardex, Intake/Output, MAR, and Cardiac Rhythm Afib .      0755:  Patient is agitated that the BP cuff is on her arm, tried to explain the importance of monitoring the BP, however patient still refuses to have it on.  Will give her a break and resume BP monitoring.    1000:  Endo at bedside to prep for NGT placement, patient was refusing.  Notified speech to evaluate patient, due to patient being more awake and alert.  Speech at bedside assessing patient, no need for NGT placement.  Will notify NP to cancel GI consult.

## 2021-02-07 NOTE — Progress Notes (Signed)
NEUROLOGY PROGRESS NOTE       Patient: [name] [name] MRN: [id number]  SSN: [id number]    Date of Birth: 05/27/1933  Age: 87 y.o.  Sex: female        Chief Complaint : Left sided weakness, acute stroke      Assessment / Plan:    87 y.o.female of hypertension, atrial fibrillation not on anticoagulation due to falls presented with acute onset of left-sided weakness due to R MCA stroke in the setting of Right M1 occlusion status post mechanical thrombectomy and successful revascularization.  Repeat CTA head shows widely patent R MCA without any thrombus.  Suspect cardioembolic stroke.  Patient will need to be on anticoagulation going forward.  Discussed with the patient and she expressed understanding.     -MRI brain without contrast pending  - Echocardiogram pending  -Eliquis 5 mg p.o. twice daily  - DC aspirin  - Atorvastatin 40 mg p.o. daily  - Neurochecks every hour  - Telemetry  - BP parameters per neuro endovascular service, SBP to be maintained 100-140  -PT/OT/ST.  Anticipate ongoing rehab needs.  -DVT prophylaxis, on anticoagulation      Subjective  Feels well.  Moving left side much better today.  Speech improved    Review of Systems  No chest pain, shortness of breath, nausea, vomiting, diarrhea, fever or chills      Objective    Vitals:    02/07/21 0645 02/07/21 0700 02/07/21 0759 02/07/21 0800   BP: (!) 110/59 106/63     Pulse: (!) 57 63  65   Resp: 13 14     Temp: 97.3 ??F (36.3 ??C) 97.3 ??F (36.3 ??C)     SpO2: 97% 99% 95%    Weight:       Height:              Physical Examination  CONSTITUTIONAL: This is a well nourished, elderly female who is resting in bed comfortably. Not in any acute distress.   PSYCHIATRIC: Mood and affect appropriate  HEAD: Normocephalic, atraumatic  HEENT: Neck supple, no lymphadenopathy  CARDIOVASCULAR: Regular rate and rhythm. No murmur no gallop.  SKIN: No rash seen    Neurological examination  MENTAL STATUS: Awake and alert  Answers questions appropriately and follows all  commands  Oriented to age, month and reason for hospitalization  CN: No gaze deviation  PERRL. Extraocular movements intact.   No gross visual field deficits or nystagmus seen.  Left facial weakness +.  Facial sensation intact to light touch bilaterally.  MOTOR: Tone normal.  No rigidity or spasticity.  No atrophy.  No tremors, asterixis, myoclonus, dystonia or other abnormal movements noted  Left hemiparesis +.  Some effort against gravity in LUE, able to grip fingers.  Some movement in LLE but unable to raise limb completely against gravity.  SENSORY: Sensation to pinprick decreased in left upper extremity  SPEECH: Moderate dysarthria  LANGUAGE: No aphasia.  Repetition intact.  Naming impaired.  Could not read due to lack of eyeglasses.  Reading, repetition and naming intact.  COORDINATION: No dysmetria on finger-nose-finger testing on right.  Did not attempt heel-knee-shin testing.  Could not attempt ataxia testing on left due to degree of weakness.    INATTENTION: No inattention to bilateral simultaneous visual or tactile stimuli  GAIT: deferred      Medications  Current Facility-Administered Medications   Medication Dose Route Frequency    albuterol-ipratropium (DUO-NEB) 2.5 MG-0.5 MG/3 ML  3 mL Nebulization Q4H  PRN    [Held by provider] furosemide (LASIX) tablet 20 mg  20 mg Oral DAILY    [Held by provider] hydrOXYchloroQUINE (PLAQUENIL) tablet 200 mg  200 mg Oral BID    [Held by provider] lactulose (CHRONULAC) 10 gram/15 mL solution 30 mL  20 g Oral BID    [Held by provider] levothyroxine (SYNTHROID) tablet 125 mcg  125 mcg Oral ACB    [Held by provider] lisinopriL (PRINIVIL, ZESTRIL) tablet 10 mg  10 mg Oral DAILY    [Held by provider] metoprolol tartrate (LOPRESSOR) tablet 25 mg  25 mg Oral Q8H    [Held by provider] potassium chloride (K-DUR, KLOR-CON M20) SR tablet 40 mEq  40 mEq Oral DAILY    [Held by provider] rifAXIMin (XIFAXAN) tablet 550 mg  550 mg Oral BID    CALCIUM ELECTROLYTE REPLACEMENT PROTOCOL  STANDARD DOSING  1 Each Other PRN    POTASSIUM ELECTROLYTE REPLACEMENT PROTOCOL STANDARD DOSING  1 Each Other PRN    MAGNESIUM ELECTROLYTE REPLACEMENT PROTOCOL STANDARD DOSING  1 Each Other PRN    NOREPINephrine (LEVOPHED) 8 mg in 0.9% NS 27m infusion  0.5-16 mcg/min IntraVENous TITRATE    niCARdipine (CARDENE) 50 mg in 0.9% sodium chloride 250 mL infusion  0-15 mg/hr IntraVENous TITRATE    dextrose 5% - 0.45% NaCl with KCl 20 mEq/L infusion  60 mL/hr IntraVENous CONTINUOUS    ondansetron (ZOFRAN) injection 4 mg  4 mg IntraVENous Q6H PRN    aspirin (ASA) suppository 300 mg  300 mg Rectal DAILY       Imaging Results  XR ABD (KUB)    Result Date: 02/06/2021  Exam: XR ABD (KUB) Indication: dobhoff placement Additional information: None. COMPARISON: None     FINDINGS/IMPRESSION: Small bowel feeding tube tip resides in the distal esophagus. Large amount of tubing coils in the upper esophagus. Recommend repositioning. Electronically signed by: JCecilio Asper MD 02/06/2021 5:16 PM EST     CTA HEAD    Result Date: 02/06/2021  CTA HEAD, CTA NECK INDICATION: headache. Status post recent right MCA thrombectomy. COMPARISON: CTA head/neck same day TECHNIQUE: CTA of the head and neck was performed with  intravenous contrast. Multiplanar two-dimensional and maximum intensity projection reformats performed and reviewed. To expedite the results of stroke protocol patients, volumetric 3-D imaging was not included at the time of dictation. All CT exams at this facility use one or more dose reduction techniques including automatic exposure control, mA/kV adjustment per patient's size, or iterative reconstruction technique. CAROTID STENOSIS REFERENCE USING NASCET CRITERIA % Stenosis = (1 - narrowest diameter/diameter of distal artery) x100 Mild: < 50% stenosis Moderate: 50-69% stenosis. Severe: 70-94% stenosis. Near occlusion: 95-99% stenosis. Occluded: 100% stenosis FINDINGS: Head Vasculature: There has been interval thrombectomy  of the right MCA. The right MCA currently remains patent and perfuses the right MCA territory as expected, grossly symmetrical compared to the left MCA territory. Left MCA remains patent. Anterior and posterior cerebral arteries remain patent. Vertebrobasilar system is patent. No discrete aneurysm or AVM. Neck Vasculature: There is no hemodynamically significant stenosis within the carotid or vertebral arteries. There is no evidence of dissection. Brain: No findings to suggest acute territorial infarction. No parenchymal hemorrhage. Mild generalized cerebral atrophy and periventricular chronic microvascular changes. Other: Some small volume gas within several venous structures involving the right face and neck, likely secondary to recent procedure. Upper lungs: Trace bilateral pleural effusions. Ascending aortic ectasia measuring up to 4.5 cm.     IMPRESSION: 1.  Status post right  MCA thrombectomy. Right MCA currently remains widely patent. 2.  No large vessel occlusion in the head or neck. No evidence of dissection. 3.  No parenchymal hemorrhage or evidence of large territorial infarction. Electronically signed by: Linus Galas, MD 02/06/2021 3:22 PM EST     CTA NECK    Result Date: 02/06/2021  CTA HEAD, CTA NECK INDICATION: headache. Status post recent right MCA thrombectomy. COMPARISON: CTA head/neck same day TECHNIQUE: CTA of the head and neck was performed with  intravenous contrast. Multiplanar two-dimensional and maximum intensity projection reformats performed and reviewed. To expedite the results of stroke protocol patients, volumetric 3-D imaging was not included at the time of dictation. All CT exams at this facility use one or more dose reduction techniques including automatic exposure control, mA/kV adjustment per patient's size, or iterative reconstruction technique. CAROTID STENOSIS REFERENCE USING NASCET CRITERIA % Stenosis = (1 - narrowest diameter/diameter of distal artery) x100 Mild: < 50%  stenosis Moderate: 50-69% stenosis. Severe: 70-94% stenosis. Near occlusion: 95-99% stenosis. Occluded: 100% stenosis FINDINGS: Head Vasculature: There has been interval thrombectomy of the right MCA. The right MCA currently remains patent and perfuses the right MCA territory as expected, grossly symmetrical compared to the left MCA territory. Left MCA remains patent. Anterior and posterior cerebral arteries remain patent. Vertebrobasilar system is patent. No discrete aneurysm or AVM. Neck Vasculature: There is no hemodynamically significant stenosis within the carotid or vertebral arteries. There is no evidence of dissection. Brain: No findings to suggest acute territorial infarction. No parenchymal hemorrhage. Mild generalized cerebral atrophy and periventricular chronic microvascular changes. Other: Some small volume gas within several venous structures involving the right face and neck, likely secondary to recent procedure. Upper lungs: Trace bilateral pleural effusions. Ascending aortic ectasia measuring up to 4.5 cm.     IMPRESSION: 1.  Status post right MCA thrombectomy. Right MCA currently remains widely patent. 2.  No large vessel occlusion in the head or neck. No evidence of dissection. 3.  No parenchymal hemorrhage or evidence of large territorial infarction. Electronically signed by: Linus Galas, MD 02/06/2021 3:22 PM EST     NEUROVASCULAR PROCEDURE    Result Date: 02/06/2021  Table formatting from the original result was not included. NEUROSURGERY OPERATIVE REPORT   Patient: CAMYLA CAMPOSANO MRN: [id number]  SSN: [id number]  Date of Birth: 01/27/1934  Age: 87 y.o.  Sex: female              Surgeon: Caryl Asp, MD   Date of Procedure: 02/06/2021   Preoperative Diagnosis: Right M1 occlusion   Postoperative Diagnosis: Same   Procedure: 1. Right common femoral arteriogram (GROIN ACCESS: 9:30AM) 2. Right innominate arteriogram 3. Right internal carotid arteriogram 4. Mechanical thrombectomy with Trevo stent  retriever and mechanical aspiration (FIRST PASS 11:07AM) 5. Right internal carotid arteriogram (TICI REPERFUSION: 11:12AM)   Indications: 87 y.o. female with LKN 8:30PM last night. This AM, she woke up with L sided weakness and dysarthria. 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 No IV-TPA was given. She has a history of atrial fibrillation, but not on anticoagulation because of risk of falls. Lives in assisted living.  The risk, benefits, alternatives were clearly discussed with her son and power of attorney, and she would elected to have her undergo the above-stated procedure.   Procedure in Detail: After consent, the patient was brought to the neurovascular suite.  Anesthesia was induced.  Lines were placed.  The patient  was positioned supine with all pressure points well-padded.  The right groin was shaved, cleaned, and prepped in standard sterile fashion.  Timeout was performed.  Systolic blood pressure goals were kept 140-180 throughout the case. Lidocaine was injected.  The right common femoral artery was accessed using a micropuncture kit and an 8 [place] short ACT sheath was placed.  A connector tubing was given to anesthesia to transduce blood pressure throughout the case. RAO angiogram revealed antegrade flow into the right common femoral artery with access point above the bifurcation, with no atherosclerosis or dissection, amenable to use of a closure device.  Heparin 2000 units was administered IV.    A 088 penumbra catheter was advanced over a 5 [place] select catheter and 035 Glidewire across the aortic arch into the right innominate artery.  Based on the tortuosity of the aortic arch as well as the innominate artery, the 42F select catheter was removed.  The decision was made to remove the catheters and obtain more support. The A line and blood pressure cuff were correlated. The 8 French short sheath was exchanged for an Arrow 22F medium sheath catheter.   The 088 penumbra catheter was advanced over a Simmons 2 catheter and Glidewire advantage across the aortic arch and into innominate artery and the innominate artery.  Multiple attempts were made to access the innominate artery and right common carotid artery and right internal carotid artery with much difficulty. Attempts were made to catheterize the arch and innominate artery with the Glidewire advantage and 035 Glidewire.  After pulling out the Glidewire advantage, there was evidence of a significant amount of clot in the [facility] catheter.  This occurred on two separate occasions. The Simmons catheter had to be removed twice based on the degree of clot that was found in the catheter.  Additional 1000 units of heparin was administered.   After multiple attempts, the Arrow sheath and 088 catheter were parked on the proximal curve of the arch, at the take-off of the innominate artery. The [facility] catheter was removed. AP and lateral angiograms of the right innominate artery provided a roadmap for further catheterization into the R ICA. There was no evidence of significant carotid stenosis.  Once the there was sufficient support, the 035 Glidewire was advanced into the right internal carotid artery and the 5 [place] select catheter followed into the proximal right cervical segment of the ICA.  The Haring was removed.  AP and lateral angiograms of the right internal carotid artery revealed antegrade flow into the ICA, ACA, M1 segment, with evidence of a right M1 occlusion.    Next, a phenom 021 microcatheter was advanced over a Synchro 2 standard microwire into the right ICA and right MCA.  The microwire was removed.  Next a Trevo 4 x 40 stent retriever was advanced, centered on the focal area of occlusion and deployed under fluoroscopic guidance.  The microcatheter was removed.  After approximately 5 minutes, the stent retriever was removed while the 42F select catheter was placed under aspiration.  As the stent  was removed, the 5 [place] select catheter and the 088 penumbra catheter advanced further into the cervical segment of the ICA.  There was a significant amount of thick dark clot noted in the stent retriever.  Post mechanical thrombectomy angiogram of the right ICA revealed antegrade flow into the ICA, ACA, MCA, with TICI 3 reperfusion.  The catheters were withdrawn.  Hemostasis was obtained using a 6 / 7 Mynx and manual compression.      :  1. Right M1 occlusion s/p mechanical thrombectomy with Trevo stent retriever with TICI 3 reperfusion.      Lab Results  Recent Results (from the past 24 hour(s))   GLUCOSE, POC    Collection Time: 02/06/21 12:20 PM   Result Value Ref Range    Glucose (POC) 142 (H) 65 - 105 mg/dL   GLUCOSE, POC    Collection Time: 02/06/21  5:36 PM   Result Value Ref Range    Glucose (POC) 139 (H) 65 - 105 mg/dL   GLUCOSE, POC    Collection Time: 02/07/21 12:24 AM   Result Value Ref Range    Glucose (POC) 135 (H) 65 - 627 mg/dL   METABOLIC PANEL, COMPREHENSIVE    Collection Time: 02/07/21  1:22 AM   Result Value Ref Range    Potassium 3.8 3.5 - 5.1 mEq/L    Chloride 111 (H) 98 - 107 mEq/L    Sodium 141 136 - 145 mEq/L    CO2 22 20 - 31 mEq/L    Glucose 118 (H) 74 - 106 mg/dl    BUN 9 9 - 23 mg/dl    Creatinine 0.67 0.55 - 1.02 mg/dl    GFR est AA >60.0      GFR est non-AA >60      Calcium 8.4 (L) 8.7 - 10.4 mg/dl    Anion gap 8 5 - 15 mmol/L    AST (SGOT) 19.0 0.0 - 33.9 U/L    ALT (SGPT) 9 (L) 10 - 49 U/L    Alk. phosphatase 70 46 - 116 U/L    Bilirubin, total 0.90 0.30 - 1.20 mg/dl    Protein, total 4.8 (L) 5.7 - 8.2 gm/dl    Albumin 1.5 (L) 3.4 - 5.0 gm/dl   MAGNESIUM    Collection Time: 02/07/21  1:22 AM   Result Value Ref Range    Magnesium 1.6 1.6 - 2.6 mg/dL   LIPID PANEL    Collection Time: 02/07/21  1:22 AM   Result Value Ref Range    Cholesterol, total 80 0 - 199 mg/dl    HDL Cholesterol 25 (L) 40 - 60 mg/dl    Triglyceride 75 0 - 150 mg/dl    LDL, calculated 40 0 - 130 mg/dl    CHOL/HDL  Ratio 3.2 0.0 - 4.4 Ratio   CBC WITH AUTOMATED DIFF    Collection Time: 02/07/21  1:22 AM   Result Value Ref Range    WBC 8.7 4.0 - 11.0 1000/mm3    RBC 3.03 (L) 3.60 - 5.20 M/uL    HGB 9.4 (L) 13.0 - 17.2 gm/dl    HCT 28.6 (L) 37.0 - 50.0 %    MCV 94.4 80.0 - 98.0 fL    MCH 31.0 25.4 - 34.6 pg    MCHC 32.9 30.0 - 36.0 gm/dl    PLATELET 160 140 - 450 1000/mm3    MPV 10.8 (H) 6.0 - 10.0 fL    RDW-SD 45.7 36.4 - 46.3      NRBC 0 0 - 0      IMMATURE GRANULOCYTES 0.2 0.0 - 3.0 %    NEUTROPHILS 64.1 (H) 34 - 64 %    LYMPHOCYTES 24.2 (L) 28 - 48 %    MONOCYTES 9.4 1 - 13 %    EOSINOPHILS 1.4 0 - 5 %    BASOPHILS 0.7 0 - 3 %   AMMONIA    Collection Time: 02/07/21  1:22 AM   Result Value Ref  Range    Ammonia, plasma 35.0 (H) 11.2 - 31.7 umol/L   HEMOGLOBIN A1C W/O EAG    Collection Time: 02/07/21  1:22 AM   Result Value Ref Range    Hemoglobin A1c 5.3 3.8 - 5.6 %   BILIRUBIN, DIRECT    Collection Time: 02/07/21  1:22 AM   Result Value Ref Range    Bilirubin, direct 0.4 (H) 0.0 - 0.3 mg/dl   GLUCOSE, POC    Collection Time: 02/07/21  7:04 AM   Result Value Ref Range    Glucose (POC) 132 (H) 65 - 105 mg/dL         Kenard Gower, MD  February 07, 2021, 9:26 AM

## 2021-02-07 NOTE — Progress Notes (Signed)
ICU PROGRESS NOTE    Name: [name] [name] MRN: [id number]   DOB: 08/19/1933 Hospital: [facility]   Date: 02/07/2021  Admission Date: 02/06/2021       IMPRESSION / PLAN:   Acute CVA secondary to Right M1 occlusion (presumed due to A. Fib) s/p thrombectomy with TICI 3 reperfusion.  Continue PT OT and speech therapy.  Neurosurgery notes seen.  Recommendation noted.  Left hemiparesis.  Continue PT OT.  Chronic atrial fibrillation.  Off anticoagulation because of fall risk.  Essential hypertension.  Hemochromatosis.  Hypothyroidism.  Anemia chronic.  H&H 9.4 and 28.6 respectively.  History of DVT.  DNR status per family's request  Plan  Continue present therapy  Continue to monitor electrolytes and renal function  Continue DVT prophylaxis  Patient's condition is guarded  Discussed condition with RN and patient's family at bedside.  PT OT for muscle strengthening and gait training.  Speech therapy.  Home upon a stabilizing her condition.     Chart and notes reviewed. Data reviewed. I have evaluated all findings.  [x] I have reviewed the flowsheet and previous day???s notes.  [] The patient is unable to give any meaningful history or review of systems because the patient is:  [] Intubated [] Sedated   [] Unresponsive      [x] The patient is critically ill on  acute CVA with left hemiparesis.    [] Mechanical ventilation [x] Pressors   [] BiPAP []                  ROS: Left-sided weakness.    Events and notes from last 24 hours reviewed. Care plan discussed on multidisciplinary rounds.  Patient Active Problem List   Diagnosis Code    Deep venous thrombosis (HCC) I82.409    Subdural hematoma caused by concussion S06.5XAA    Subdural hematoma S06.5XAA    Metabolic encephalopathy G93.41    Contusion of left hip S70.02XA    Left elbow contusion S50.02XA    Multiple falls R29.6    Atrial fibrillation (HCC) I48.91    Other partial intestinal obstruction (HCC) K56.690    COVID-19 U07.1    Stroke (HCC) I63.9    CVA  (cerebral vascular accident) (HCC) I63.9       Vital Signs:  Visit Vitals  BP 117/75   Pulse (!) 59   Temp 97 ??F (36.1 ??C)   Resp 20   Ht 5\' 3"  (1.6 m)   Wt 71 kg (156 lb 8.4 oz)   SpO2 98%   BMI 27.73 kg/m??       O2 Device: None (Room air)       Temp (24hrs), Avg:97.5 ??F (36.4 ??C), Min:96.4 ??F (35.8 ??C), Max:97.9 ??F (36.6 ??C)       Intake/Output:   Last shift:      11/25 0701 - 11/25 1900  In: 64.5 [I.V.:64.5]  Out: 120 [Urine:120]  Last 3 shifts: 11/23 1901 - 11/25 0700  In: 1738.6 [I.V.:1738.6]  Out: 1225 [Urine:1225]    Intake/Output Summary (Last 24 hours) at 02/07/2021 1252  Last data filed at 02/07/2021 0759  Gross per 24 hour   Intake 1353.11 ml   Output 1045 ml   Net 308.11 ml        Ventilator Settings:                Current Facility-Administered Medications   Medication Dose Route Frequency    [Held by provider] furosemide (LASIX) tablet 20 mg  20 mg Oral DAILY    [Held by  provider] hydrOXYchloroQUINE (PLAQUENIL) tablet 200 mg  200 mg Oral BID    [Held by provider] lactulose (CHRONULAC) 10 gram/15 mL solution 30 mL  20 g Oral BID    [Held by provider] levothyroxine (SYNTHROID) tablet 125 mcg  125 mcg Oral ACB    [Held by provider] lisinopriL (PRINIVIL, ZESTRIL) tablet 10 mg  10 mg Oral DAILY    [Held by provider] metoprolol tartrate (LOPRESSOR) tablet 25 mg  25 mg Oral Q8H    [Held by provider] potassium chloride (K-DUR, KLOR-CON M20) SR tablet 40 mEq  40 mEq Oral DAILY    [Held by provider] rifAXIMin (XIFAXAN) tablet 550 mg  550 mg Oral BID    NOREPINephrine (LEVOPHED) 8 mg in 0.9% NS infusion  0.5-16 mcg/min IntraVENous TITRATE    niCARdipine (CARDENE) 50 mg in 0.9% sodium chloride 250 mL infusion  0-15 mg/hr IntraVENous TITRATE    dextrose 5% - 0.45% NaCl with KCl 20 mEq/L infusion  60 mL/hr IntraVENous CONTINUOUS    aspirin (ASA) suppository 300 mg  300 mg Rectal DAILY       Telemetry: Normal sinus rhythm, no arrythima    Physical Exam:   General:  Alert and oriented x3, cooperative, no  distress, appears stated age.   Head:  Normocephalic, without obvious abnormality, atraumatic.   Eyes:  Conjunctivae/corneas clear. PERRL, EOMs intact.   Nose: Nares normal. Septum midline. Mucosa normal. No drainage or sinus tenderness.   Throat: Lips, mucosa, and tongue normal. Teeth and gums normal.   Neck: Supple, symmetrical, trachea midline, no adenopathy, thyroid: no enlargement/tenderness/nodules, no carotid bruit and no JVD.   Back:   Symmetric, no curvature. ROM normal. No CVA tenderness.   Lungs:   Clear to auscultation bilaterally.   Chest wall:  No tenderness or deformity.   Heart:  Regular rate and rhythm, S1, S2 normal, no murmur, click, rub or gallop.   Abdomen:   Soft, non-tender. Bowel sounds normal. No masses,  No organomegaly. Rectal deferred.   Lympathic: Within normal limits.   Vascular: Within normal limits   Extremities: Extremities normal, atraumatic, no cyanosis or edema.   Pulses: 2+ and symmetric all extremities.   Neurologic: Acute CVA with left hemiparesis.   Mental Status:   Alert and oriented x3, no suicidal, homicidal or hallucinatory ideation   Skin: Good turgor, no rash or lesions      DATA:  MAR reviewed and pertinent medications noted or modified as needed     Labs:  Recent Labs     02/07/21  0122 02/06/21  0745 02/06/21  0415   WBC 8.7 8.8 6.7   HGB 9.4* 12.2* 12.8*   HCT 28.6* 36.4* 38.2   PLT 160 191 191     Recent Labs     02/07/21  0122 02/06/21  0745 02/06/21  0415   NA 141 139 139   K 3.8 3.9 3.7   CL 111* 108* 106   CO2 22 22 21    GLU 118* 143* 180*   BUN 9 12 12    CREA 0.67 0.88 0.97   CA 8.4* 9.2 9.4   MG 1.6  --   --    ALB 1.5* 3.0*  --    ALT 9* 9*  --    INR  --  1.2*  --      No results for input(s): PH, PCO2, PO2, HCO3, FIO2 in the last 72 hours.  No results for input(s): FIO2I, IFO2, HCO3I, IHCO3, HCOPOC, PCO2I, PCOPOC,  IPHI, PHI, PHPOC, PO2I, PO2POC in the last 72 hours.    No lab exists for component: IPOC2  Imaging:  [x]   I have personally reviewed the  patient???s radiographs and reports    High complexity decision making was performed during this consultation and evaluation.  [x]    Pt is at high risk for further organ failure and dysfunction.     Critical care time spent  minutes with patient exclusive of procedures 60.    , MD  02/07/2021  12:52 PM      Dragon medical dictation software was used for portions of this report. Unintended voice recognition errors may occur.

## 2021-02-07 NOTE — Progress Notes (Signed)
NEUROSURGERY PROGRESS NOTE     Chief Concern:   Right M1 occlusion (presumed due to A. Fib) s/p thrombectomy with TICI 3 reperfusion       Assessment/Plan:   - Stroke management per neurology  - Follow up with CTA head and neck in 3 months   - recommend reconsideration of anticoagulation in setting of Afib  - Neurosurgery to sign off   - May transfer out of unit at 24 hours     24 Hour Events:   - no IV tPA as outside window   - successful reperfusion   - AF, NAE       Focused Exam:     - Aox3   - left facial weakness  - left arm antigravity, poor dexterity  - unable to lift left leg to gravity, right leg only 4/5 (baseline)   - groin site OK     Laretta Alstrom, MD  Covering Physician for:   [facility]  [address]  [place], [place] [address]  Clinic: [phone]  Fax: [phone]  Cell: [phone]

## 2021-02-07 NOTE — Progress Notes (Signed)
Patient admitted on 02/06/2021 from [place] at [facility] with   Chief Complaint   Patient presents with    Stroke          The patient is being treated for    PMH:   Past Medical History:   Diagnosis Date    Arthritis     Deep venous thrombosis ([place])     Endocrine disease     hypothyroid    Hemochromatosis     Hypertension         Treatment Team: Treatment Team: Attending Provider: Colen Darling, MD; Consulting Provider: Other, Phys, MD; Consulting Provider: Colen Darling, MD; Consulting Provider: Hildred Alamin, MD; Consulting Provider: Kenard Gower, MD; Consulting Provider: Donavan Foil, MD; Physical Therapist: Lysle Rubens, PT; Occupational Therapist: Milus Banister, OT; Primary Nurse: Leitha Bleak, RN      The patient has been admitted to the hospital 1 times in the past 12 months.    Previous 4 Admission Dates Admission and Discharge Diagnosis Interventions Barriers Disposition                                 Patient and Family/Caregivers Goals of Care: improved health     Caregivers Participating in Plan of Care/Discharge Plan with the patient: pt, son, CM    Tentative dc plan:  tbd    Anticipated DME needs for discharge: tbd    PRESCREENING COMPLETED FOR SNF  yes *  Will patient qualify for medicaid now or in the next 180 days? Current       Has patient had covid vaccine? Y/N   Date and type if not in chart under immunization field yes x's 3    Does patient have an ACP? No          Does the patient have appropriate clothing available to be worn at discharge? yes      anticipated Discharge Date: tbd      Barriers to Healthcare Success/ Readmission Risk Factors: na    Consults:  Palliative Care Consult Recommended: no  Transitional Care Clinic Referral: no  Transitional Nurse Navigator Referral: no  Oncology Navigator Referral: non  SW consulted: no  [facility] Consulted: no  Outside [employer] Referrals and Collaboration:  no    Food/Nutrition Needs:   nono                   Dietician Consulted: no    RRAT Score: High Risk            24 Total Score    3 Has Seen PCP in Last 6 Months (Yes=3, No=0)    2 Married. Living with Significant Other. Assisted Living. LTAC. SNF. or   Rehab    4 IP Visits Last 12 Months (1-3=4, 4=9, >4=11)    5 Pt. Coverage (Medicare=5 , Medicaid, or Self-Pay=4)    10 Charlson Comorbidity Score (Age + Comorbid Conditions)        Criteria that do not apply:    Patient Length of Stay (>5 days = 3)           PCP: Colen Darling, MD . How do you get to your doctor appointment  lives at [facility]    Specialists:  no     Dialysis Unit: non    Pharmacy:   name [facility] of pharmacy  address  Are there any medications that you have trouble paying for  no  Any difficulty getting your medication  no     DME available at Home: walker     Home O2 L Flow:   no            Home O2 Provider:  no    Home Environment and Prior Level of Function: Lives at [place] [address] _0 @. Lives with  ALF   How many stories is home     Steps into home  Responsibilities at home include adl's    Prior to admission open services:  no     [place]  no  [place] no    Extended Emergency Contact Information  Primary Emergency Contact: Hoganson,Christopher J  Address: [place] [address] [place]  Home Phone: [phone]  Mobile Phone: [phone]  Relation: Son     Transportation: BLS  will transport home    Therapy Recommendations:    OT = p    PT = p    SLP =  p     RT Home O2 Evaluation =  n    Wound Care =  n    Case Management Assessment    ABUSE/NEGLECT SCREENING   Physical Abuse/Neglect: Denies   Sexual Abuse: Denies   Sexual Abuse: Denies   Other Abuse/Issues: Denies          PRIMARY DECISION MAKER                                   CARE MANAGEMENT INTERVENTIONS   Readmission Interview Completed: Not Applicable   PCP Verified  by CM: Yes       Palliative Care Criteria Met (RRAT>21 & CHF Dx)?: No   Mode of Transport at Discharge: BLS       Transition of Care Consult (CM Consult): Discharge Planning           MyChart Signup: No   Discharge Durable Medical Equipment: No   Physical Therapy Consult: Yes   Occupational Therapy Consult: Yes   Speech Therapy Consult: Yes       Reason for Referral: DCP Rounds   History Provided By: Patient, Medical Record   Patient Orientation: Alert and Oriented, Person   Cognition: Short Term Memory Deficit   Support System Response: Concerned, Cooperative   Previous Living Arrangement: Lives with Family Independent   Home Accessibility: Steps   Prior Functional Level: Independent in ADLs/IADLs   Current Functional Level: Independent in ADLs/IADLs       Can patient return to prior living arrangement: Unknown at present   Ability to make needs known:: Good   Family able to assist with home care needs:: Yes               Types of Needs Identified: Disease Management Education, Treatment Education, ADLs/IADLs, Support System/Social       Confirm Follow Up Transport: Other (see comment)                  DISCHARGE LOCATION

## 2021-02-07 NOTE — Progress Notes (Signed)
Limited echo with [facility] completed.

## 2021-02-07 NOTE — Progress Notes (Signed)
PHYSICAL THERAPY         Patient: [name] [name] (87 y.o. female)  Room: 4322/4322    Date: 02/07/2021  Time:  07:43  Primary Diagnosis: Stroke [facility]) [I63.9]  CVA (cerebral vascular accident) [facility]) [I63.9]  Atrial fibrillation (HCC) [I48.91]    Orders reviewed, chart reviewed.   Patient was not seek for skilled PT evaluation secondary to patient still within 24 hour window s/p mechanical thrombectomy and has active bedrest orders.  Will hold at this time.         Bettey Mare, PT, DPT

## 2021-02-07 NOTE — Progress Notes (Signed)
OCCUPATIONAL THERAPY         Patient: [name] [name] (87 y.o. female)  Room: 4322/4322    Primary Diagnosis: Stroke [facility]) [I63.9]  CVA (cerebral vascular accident) (HCC) [I63.9]  Atrial fibrillation (HCC) [I48.91]   Procedure(s) (LRB):  ANGIOGRAPHY CEREBRAL INTRACRNIAL W ANESTHESIA (45409) (N/A) 1 Day Post-Op  Date of Admission: 02/06/2021   Insurance: Payor: [place] / Plan: [facility] MEDICARE A & B / Product Type: Medicare /      Date: 02/07/2021  Time: 7:35    OBJECTIVE:     Orders, labs, and chart reviewed on [name] [name].    Patient was not seen for skilled occupational therapy evaluation, secondary to patient still within 24 hr window s/p mechanical thrombectomy. On active bedrest orders. Will hold at this time.    PLAN:     Our services will follow up as patient's condition and/or schedule permits.    Myrle Sheng, OTD, OTR/L  February 07, 2021

## 2021-02-08 LAB — CBC WITH AUTOMATED DIFF
BASOPHILS: 0.5 % (ref 0–3)
EOSINOPHILS: 1.5 % (ref 0–5)
HCT: 28.5 % — ABNORMAL LOW (ref 37.0–50.0)
HGB: 9.6 gm/dl — ABNORMAL LOW (ref 13.0–17.2)
IMMATURE GRANULOCYTES: 0.5 % (ref 0.0–3.0)
LYMPHOCYTES: 18 % — ABNORMAL LOW (ref 28–48)
MCH: 31.5 pg (ref 25.4–34.6)
MCHC: 33.7 gm/dl (ref 30.0–36.0)
MCV: 93.4 fL (ref 80.0–98.0)
MONOCYTES: 8 % (ref 1–13)
MPV: 11.3 fL — ABNORMAL HIGH (ref 6.0–10.0)
NEUTROPHILS: 71.5 % — ABNORMAL HIGH (ref 34–64)
NRBC: 0 (ref 0–0)
PLATELET: 174 10*3/uL (ref 140–450)
RBC: 3.05 M/uL — ABNORMAL LOW (ref 3.60–5.20)
RDW-SD: 45.1 (ref 36.4–46.3)
WBC: 12 10*3/uL — ABNORMAL HIGH (ref 4.0–11.0)

## 2021-02-08 LAB — GLUCOSE, POC
Glucose (POC): 149 mg/dL — ABNORMAL HIGH (ref 65–105)
Glucose (POC): 115 mg/dL — ABNORMAL HIGH (ref 65–105)
Glucose (POC): 117 mg/dL — ABNORMAL HIGH (ref 65–105)
Glucose (POC): 178 mg/dL — ABNORMAL HIGH (ref 65–105)

## 2021-02-08 LAB — METABOLIC PANEL, BASIC
Anion gap: 10 mmol/L (ref 5–15)
BUN: 6 mg/dl — ABNORMAL LOW (ref 9–23)
CO2: 21 mEq/L (ref 20–31)
Calcium: 8.5 mg/dl — ABNORMAL LOW (ref 8.7–10.4)
Chloride: 110 mEq/L — ABNORMAL HIGH (ref 98–107)
Creatinine: 0.57 mg/dl (ref 0.55–1.02)
GFR est AA: >60
GFR est non-AA: >60
Glucose: 110 mg/dl — ABNORMAL HIGH (ref 74–106)
Potassium: 3.9 mEq/L (ref 3.5–5.1)
Sodium: 141 mEq/L (ref 136–145)

## 2021-02-08 LAB — PHOSPHORUS
Phosphorus: 2.8 mg/dL (ref 2.4–5.1)
Phosphorus: 2.8 mg/dL (ref 2.4–5.1)

## 2021-02-08 LAB — CALCIUM, IONIZED
CALCIUM,IONIZED: 5 mg/dl (ref 4.4–5.4)
CALCIUM,IONIZED: 5 mg/dl (ref 4.4–5.4)

## 2021-02-08 LAB — MAGNESIUM
Magnesium: 1.5 mg/dL — ABNORMAL LOW (ref 1.6–2.6)
Magnesium: 1.5 mg/dL — ABNORMAL LOW (ref 1.6–2.6)

## 2021-02-08 LAB — BASIC METABOLIC PANEL
Anion Gap: 10 mmol/L (ref 5–15)
BUN: 6 mg/dl — ABNORMAL LOW (ref 9–23)
CO2: 21 mEq/L (ref 20–31)
Calcium: 8.5 mg/dl — ABNORMAL LOW (ref 8.7–10.4)
Chloride: 110 mEq/L — ABNORMAL HIGH (ref 98–107)
Creatinine: 0.57 mg/dl (ref 0.55–1.02)
EGFR IF NonAfrican American: >60
GFR African American: >60
Glucose: 110 mg/dl — ABNORMAL HIGH (ref 74–106)
Potassium: 3.9 mEq/L (ref 3.5–5.1)
Sodium: 141 mEq/L (ref 136–145)

## 2021-02-08 LAB — CBC WITH AUTO DIFFERENTIAL
Basophils %: 0.5 % (ref 0–3)
Eosinophils %: 1.5 % (ref 0–5)
Hematocrit: 28.5 % — ABNORMAL LOW (ref 37.0–50.0)
Hemoglobin: 9.6 gm/dl — ABNORMAL LOW (ref 13.0–17.2)
Immature Granulocytes: 0.5 % (ref 0.0–3.0)
Lymphocytes %: 18 % — ABNORMAL LOW (ref 28–48)
MCH: 31.5 pg (ref 25.4–34.6)
MCHC: 33.7 gm/dl (ref 30.0–36.0)
MCV: 93.4 fL (ref 80.0–98.0)
MPV: 11.3 fL — ABNORMAL HIGH (ref 6.0–10.0)
Monocytes %: 8 % (ref 1–13)
Neutrophils %: 71.5 % — ABNORMAL HIGH (ref 34–64)
Nucleated RBCs: 0 (ref 0–0)
Platelets: 174 10*3/uL (ref 140–450)
RBC: 3.05 M/uL — ABNORMAL LOW (ref 3.60–5.20)
RDW-SD: 45.1 (ref 36.4–46.3)
WBC: 12 10*3/uL — ABNORMAL HIGH (ref 4.0–11.0)

## 2021-02-08 LAB — POCT GLUCOSE
POC Glucose: 149 mg/dL — ABNORMAL HIGH (ref 65–105)
POC Glucose: 115 mg/dL — ABNORMAL HIGH (ref 65–105)
POC Glucose: 117 mg/dL — ABNORMAL HIGH (ref 65–105)
POC Glucose: 178 mg/dL — ABNORMAL HIGH (ref 65–105)

## 2021-02-08 MED ORDER — ATORVASTATIN 40 MG TAB
40 mg | Freq: Every evening | ORAL | Status: AC
Start: 2021-02-08 — End: 2021-02-19
  Administered 2021-02-09 – 2021-02-19 (×11): via ORAL

## 2021-02-08 MED ORDER — POTASSIUM CHLORIDE SR 10 MEQ TAB
10 mEq | Freq: Once | ORAL | Status: AC
Start: 2021-02-08 — End: 2021-02-08
  Administered 2021-02-08: 12:00:00 via ORAL

## 2021-02-08 MED ORDER — MAGNESIUM SULFATE 50 % (4 MEQ/ML) INJECTION
4 mEq/mL (50 %) | Freq: Once | INTRAMUSCULAR | Status: AC
Start: 2021-02-08 — End: 2021-02-08
  Administered 2021-02-08: 14:00:00 via INTRAVENOUS

## 2021-02-08 MED ORDER — ACETAMINOPHEN 500 MG TAB
500 mg | Freq: Four times a day (QID) | ORAL | Status: AC | PRN
Start: 2021-02-08 — End: 2021-02-19
  Administered 2021-02-08 – 2021-02-19 (×7): via ORAL

## 2021-02-08 MED FILL — ELIQUIS 5 MG TABLET: 5 mg | ORAL | Qty: 1

## 2021-02-08 MED FILL — ACETAMINOPHEN 500 MG TAB: 500 mg | ORAL | Qty: 2

## 2021-02-08 MED FILL — MAGNESIUM SULFATE 50 % (4 MEQ/ML) INJECTION: 4 mEq/mL (50 %) | INTRAMUSCULAR | Qty: 6

## 2021-02-08 MED FILL — POTASSIUM CHLORIDE SR 10 MEQ TAB: 10 mEq | ORAL | Qty: 1

## 2021-02-08 NOTE — Progress Notes (Signed)
NEUROLOGY PROGRESS NOTE       Patient: [name] [name] MRN: [id number]  SSN: [id number]    Date of Birth: 02/19/1934  Age: 87 y.o.  Sex: female        Chief Complaint : Left sided weakness, acute stroke        Assessment / Plan:    87 y.o.female of hypertension, atrial fibrillation not on anticoagulation due to falls presented with acute onset of left-sided weakness due to R MCA stroke in the setting of Right M1 occlusion status post mechanical thrombectomy and successful revascularization.  Repeat CTA head shows widely patent R MCA without any thrombus.  I personally reviewed the MRI brain which shows an acute infarct in the right basal ganglia along with multiple punctate infarcts in the R ACA, R MCA territory and an area of acute infarction in the left occipital lobe.  Echocardiogram: EF 73%.  No thrombus or interatrial shunting.  Etiology for stroke- cardio embolic due to A. fib.  Started on Eliquis yesterday.  Left-sided weakness improving.  Dysarthria better.    Patient was having visual hallucinations at the time of my evaluation.  She pointed to the pillow behind her head and said, " he is trying to push me off the edge.  He is an ex husband that I did do not have any contact with.  He is right here".  I told her that it was just her pillow and that there was no one in the room.  Then she pointed to the doorway and said, "My sons are right there.  The one with the white beard" even though there was no one there.  Suspect hospital-acquired delirium.    -Eliquis 5 mg p.o. twice daily  - Atorvastatin 40 mg p.o. daily  -Delirium precautions: Keep blinds open during the day, frequent reorientation, early mobilization, do not disturb for nonurgent blood draws/vitals between 10 PM to 6 AM  - Neurochecks every shift  - Telemetry  - Normotensive goals  -PT/OT/ST.  Anticipate ongoing rehab needs.  -DVT prophylaxis, on anticoagulation  -Driving restrictions were discussed with the patient.  She does not drive.  -  Outpatient follow-up with neurology in 3 to 4 weeks after discharge or sooner if clinically indicated.  Stroke work-up complete.  Neurology will sign off.  Please call with questions.      Subjective  Feels well but is hallucinating.    Review of Systems  No chest pain, shortness of breath, nausea, vomiting, diarrhea, fever or chills      Objective    Vitals:    02/08/21 0700 02/08/21 0800 02/08/21 0900 02/08/21 1000   BP: 132/70 126/86 135/84 98/67   Pulse: 76 76 71 71   Resp: 19 19 22 29    Temp: 98.8 ??F (37.1 ??C) 98.6 ??F (37 ??C) 98.6 ??F (37 ??C)    SpO2: 98% 93% 98% 97%   Weight:       Height:              Physical Examination  CONSTITUTIONAL: This is a well nourished, elderly female who is resting in bed comfortably. Not in any acute distress.   PSYCHIATRIC: Mood and affect appropriate  HEAD: Normocephalic, atraumatic  HEENT: Neck supple, no lymphadenopathy  CARDIOVASCULAR: Regular rate and rhythm. No murmur no gallop.  SKIN: No rash seen     Neurological examination  MENTAL STATUS: Awake and alert  Answers questions appropriately and follows all commands  Oriented to age, month and reason  for hospitalization  CN: No gaze deviation  PERRL. Extraocular movements intact.   No gross visual field deficits or nystagmus seen.  Left facial weakness + (improved).  Facial sensation intact to light touch bilaterally.  MOTOR: Tone normal.  No rigidity or spasticity.  No atrophy.  No tremors, asterixis, myoclonus, dystonia or other abnormal movements noted  Left hemiparesis +.  Some effort against gravity and resistance in LUE, able to grip fingers.  Some movement in LLE but unable to raise limb completely against gravity.  SENSORY: Sensation to pinprick symmetric bilaterally  SPEECH: Mild dysarthria  LANGUAGE: No aphasia.  Repetition intact.  Naming impaired.  Could not read due to lack of eyeglasses.  Reading, repetition and naming intact.  COORDINATION: No dysmetria on finger-nose-finger testing on right.  Did not attempt  heel-knee-shin testing.  Could not attempt ataxia testing on left due to degree of weakness.    INATTENTION: No inattention to bilateral simultaneous visual or tactile stimuli  GAIT: deferred      Medications  Current Facility-Administered Medications   Medication Dose Route Frequency    magnesium sulfate 3 g in 0.9% sodium chloride 100 mL IVPB  3 g IntraVENous ONCE    saline peripheral flush soln 10-20 mL  10-20 mL IntraVENous PRN    apixaban (ELIQUIS) tablet 5 mg  5 mg Oral BID    albuterol-ipratropium (DUO-NEB) 2.5 MG-0.5 MG/3 ML  3 mL Nebulization Q4H PRN    [Held by provider] furosemide (LASIX) tablet 20 mg  20 mg Oral DAILY    [Held by provider] hydrOXYchloroQUINE (PLAQUENIL) tablet 200 mg  200 mg Oral BID    [Held by provider] lactulose (CHRONULAC) 10 gram/15 mL solution 30 mL  20 g Oral BID    [Held by provider] levothyroxine (SYNTHROID) tablet 125 mcg  125 mcg Oral ACB    [Held by provider] lisinopriL (PRINIVIL, ZESTRIL) tablet 10 mg  10 mg Oral DAILY    [Held by provider] metoprolol tartrate (LOPRESSOR) tablet 25 mg  25 mg Oral Q8H    [Held by provider] potassium chloride (K-DUR, KLOR-CON M20) SR tablet 40 mEq  40 mEq Oral DAILY    [Held by provider] rifAXIMin (XIFAXAN) tablet 550 mg  550 mg Oral BID    CALCIUM ELECTROLYTE REPLACEMENT PROTOCOL STANDARD DOSING  1 Each Other PRN    POTASSIUM ELECTROLYTE REPLACEMENT PROTOCOL STANDARD DOSING  1 Each Other PRN    MAGNESIUM ELECTROLYTE REPLACEMENT PROTOCOL STANDARD DOSING  1 Each Other PRN    ondansetron (ZOFRAN) injection 4 mg  4 mg IntraVENous Q6H PRN       Imaging Results  MRI BRAIN WO CONT    Result Date: 02/07/2021  EXAMINATION: MRI brain without contrast MEDICAL HISTORY: Left-sided weakness TECHNIQUE: Multisequence, multiplanar MRI of the brain without contrast. COMPARISONS: 02/06/2021 WORKSTATION ID: [id number] FINDINGS: Small focus of acute infarct involving the right caudate body, extending to the posterior right basal ganglia, roughly measuring 2.6 x  2.0 cm in transaxial dimension. No hemorrhage or hemorrhagic conversion. A few scattered tiny foci of cortical and subcortical acute ischemia involving the right cerebral hemisphere (MCA distribution), most consistent with tiny thromboemboli. A tiny focus (7 mm) of acute infarct involving the right anterior frontal cingulate gyrus and the right paramedian posterior frontal-parietal lobe (ACA distribution). Query additional tiny focus of restricted diffusion involving the left occipital lobe versus artifact. No focal mass, midline shift or acute intracranial hemorrhage. Scattered subcortical and confluent periventricular T2 and FLAIR white matter hyperintensities, nonspecific, though most  consistent with prominent chronic small vessel ischemic change. Prominence of the ventricles, cisterns and other CSF containing spaces due to parenchymal volume loss. Flow voids within the skull base grossly unremarkable. Mild mucosal thickening of the inferior maxillary sinuses.     IMPRESSION: 1. Acute infarct involving the right caudate body, extending to the posterior right basal ganglia. No hemorrhage or hemorrhagic conversion. 2. Multiple tiny foci of acute infarct scattered throughout the right MCA distribution, most consistent with thromboembolic phenomenon. 3. A few tiny foci of ischemia involving the right ACA distribution, including a 7 mm focus involving the anterior right single gyrus. 4. Diffuse parenchymal volume loss and chronic small vessel ischemic change. Electronically signed by: Mikal Plane, MD 02/07/2021 4:26 PM EST      Lab Results  Recent Results (from the past 24 hour(s))   GLUCOSE, POC    Collection Time: 02/07/21  6:18 PM   Result Value Ref Range    Glucose (POC) 149 (H) 65 - 105 mg/dL   GLUCOSE, POC    Collection Time: 02/08/21 12:13 AM   Result Value Ref Range    Glucose (POC) 149 (H) 65 - 105 mg/dL   CBC WITH AUTOMATED DIFF    Collection Time: 02/08/21  4:00 AM   Result Value Ref Range    WBC 12.0  (H) 4.0 - 11.0 1000/mm3    RBC 3.05 (L) 3.60 - 5.20 M/uL    HGB 9.6 (L) 13.0 - 17.2 gm/dl    HCT 44.0 (L) 10.2 - 50.0 %    MCV 93.4 80.0 - 98.0 fL    MCH 31.5 25.4 - 34.6 pg    MCHC 33.7 30.0 - 36.0 gm/dl    PLATELET 725 366 - 440 1000/mm3    MPV 11.3 (H) 6.0 - 10.0 fL    RDW-SD 45.1 36.4 - 46.3      NRBC 0 0 - 0      IMMATURE GRANULOCYTES 0.5 0.0 - 3.0 %    NEUTROPHILS 71.5 (H) 34 - 64 %    LYMPHOCYTES 18.0 (L) 28 - 48 %    MONOCYTES 8.0 1 - 13 %    EOSINOPHILS 1.5 0 - 5 %    BASOPHILS 0.5 0 - 3 %   METABOLIC PANEL, BASIC    Collection Time: 02/08/21  4:00 AM   Result Value Ref Range    Potassium 3.9 3.5 - 5.1 mEq/L    Chloride 110 (H) 98 - 107 mEq/L    Sodium 141 136 - 145 mEq/L    CO2 21 20 - 31 mEq/L    Glucose 110 (H) 74 - 106 mg/dl    BUN 6 (L) 9 - 23 mg/dl    Creatinine 3.47 4.25 - 1.02 mg/dl    GFR est AA >95.6      GFR est non-AA >60      Calcium 8.5 (L) 8.7 - 10.4 mg/dl    Anion gap 10 5 - 15 mmol/L   MAGNESIUM    Collection Time: 02/08/21  4:00 AM   Result Value Ref Range    Magnesium 1.5 (L) 1.6 - 2.6 mg/dL   PHOSPHORUS    Collection Time: 02/08/21  4:00 AM   Result Value Ref Range    Phosphorus 2.8 2.4 - 5.1 mg/dL   GLUCOSE, POC    Collection Time: 02/08/21  6:24 AM   Result Value Ref Range    Glucose (POC) 115 (H) 65 - 105 mg/dL   GLUCOSE, POC  Collection Time: 02/08/21  8:13 AM   Result Value Ref Range    Glucose (POC) 117 (H) 65 - 105 mg/dL   CALCIUM, IONIZED    Collection Time: 02/08/21 10:06 AM   Result Value Ref Range    CALCIUM,IONIZED 5.0 4.4 - 5.4 mg/dl         Steffanie Dunn, MD  February 08, 2021, 11:17 AM

## 2021-02-08 NOTE — Progress Notes (Signed)
 Progress Notes by Maribeth Bernarda HERO, PT at 02/08/21 1005                Author: Maribeth Bernarda HERO, PT  Service: Physical Therapy  Author Type: Physical Therapist       Filed: 02/08/21 1130  Date of Service: 02/08/21 1005  Status: Signed          Editor: Maribeth Bernarda HERO, PT (Physical Therapist)                  PHYSICAL THERAPY EVALUATION:                 Patient: [name] [name] (87 y.o. female)   Room: 4322/4322   [x]   Patient DOB Verified      Date of Admission: 02/06/2021    Length of Stay:  2 day(s)   Primary Diagnosis: Stroke [facility]) [I63.9]   CVA (cerebral vascular accident) (HCC) [I63.9]   Atrial fibrillation (HCC) [I48.91]   Procedure(s) (LRB):   ESOPHAGOGASTRODUODENOSCOPY (EGD) (N/A) 1 Day Post-Op    Insurance: Payor: [facility] / Plan: [facility] MEDICARE A & B / Product Type: Medicare /        Date: 02/08/2021   In time: 10:00  Out time:  10:28         Precautions: Falls. SBP to be maintained 100- 140, L sided weakness, mildly thick liquids          Isolation:   There are currently no Active Isolations       MDRO: No active infections        Equipment: hospital bed         H&P:      Per chart review: 87 yr old female presented to ED after falling out of bed at ALF and presenting with L sided weakness and R gaze preference. She s/p mechanical thrombectomy  for R MCA.         Assessment:         Based on the objective data described below, the patient presents with      - ICU Mobility Level:  3 - Sitting over the edge of the bed   - Modified Rankin Score (mRS): 4 - Severe disability; unable to walk without assistance and unable to attend to own bodily needs without  assistance    - decreased mobility affecting function   - decreased independence with functional mobility   - decreased tolerance to sustained activity   - active participation in functional mobility training, bed mobility training, therapeutic activities.   - patient presents with significant posterior lean when sitting on edge of bed with  difficulty correcting.     - highly motivated to participate in PT to improve functional activity tolerance and return to prior level of function      Functional Status Score for the Intensive Care Unit (FSS-ICU)      Task   Score     1.  Rolling  3 - Mod assist (patient performs 26%-74% of the work)     2. Supine to Sit Transfer  2 - Max assist (patient performs 25% or less of the work)     3. Sit to Stand Transfer  0 - Unable to complete due to weakness     4. Sitting Edge of Bed  3 - Mod assist (patient performs 26-74% of the work)     5. Walking  0 - Unable to attempt due to weakness     TOTAL  SCORE:  8        INITIAL TOTAL SCORE:  8              Patients rehabilitation potential for below stated goals: Good.        Recommendations:   Recommend continued physical therapy during acute stay. Physical Therapy, Occupational Therapy,  and Speech Therapy. Recommend out of bed activity to counteract ill effects of bedrest, with assistance from staff as needed.   Discharge Recommendations: Skilled nursing facility (SNF): patient will benefit from further therapy at rehab facility to increase strength and  endurance to return to prior level of function, 24/7 supervision to assist as needed and for safety, Pt may return to ALF and resume therapies there if they are able to provide level of care pt currently requires (max A for transfers, non-ambulatory).   Further Equipment Recommendations for Discharge: wheelchair, hospital bed.          Plan:         Patient will benefit from skilled Physical Therapy intervention to address the above impairments to return to prior level of function. Patient will be seen 3-5 times/week for at least 1 week.      Patient will achieve PT goals in 1 week. Goals were created with input from the patient.        Physical Therapy Goals:        - Patient will be mod. assist with bed mobility in preparation for EOB activities.   - Patient will be mod. assist with transfers in preparation for OOB  activities and ambulation.   - Patient will tolerate sitting up in chair/chair positioning for at least 60 minutes in order to improve sitting tolerance.   - Patient will ambulate with moderate assistance  for 5 feet with the least restrictive device to promote functional independence at next level of care    - Patient will demonstrate good balance to safely enable upright activities and reduce risk for falls.   - Patient will demonstrate good activity tolerance during functional activities.   - Patient will demonstrate good safety awareness during functional activities.   - Patient will improve FSS-ICU score to at least 13 in order to indicate improved functional independence.   - Patient will be supervision with lower extremity home exercise program to increase strength and endurance.   - Patient will utilize energy conservation techniques during functional activities.        Planned interventions:         Skilled Physical Therapy services will provide functional mobility training, therapeutic exercises, therapeutic activities, patient/caregiver education as indicated.   Skilled Physical Therapy services will modify and progress therapeutic interventions, address functional mobility deficits, address ROM and strength deficits, analyze and cue movement  patterns and assess and modify postural abnormalities to reach the stated goals.        Subjective:         Patient agreeable to PT evaluation to assess mobility. Patient states she feels   Pretty good   Patient reports 0/10 pain before treatment and 0/10 pain at conclusion of treatment.        Objective Data Summary:         Orders, labs, and chart reviewed on [name] [name]. Communicated with patient's nurse (patient ok to be seen by PT).       Present illness history:      Patient Active Problem List           Diagnosis  Date Noted         ?  COVID-19  10/21/2020     ?  Stroke [facility])  02/06/2021     ?  CVA (cerebral vascular accident) (HCC)  02/06/2021     ?   Atrial fibrillation (HCC)  10/31/2020     ?  Other partial intestinal obstruction (HCC)  10/31/2020     ?  Metabolic encephalopathy  10/21/2020     ?  Contusion of left hip  10/21/2020     ?  Left elbow contusion  10/21/2020     ?  Multiple falls  10/21/2020     ?  Subdural hematoma caused by concussion  08/27/2017     ?  Subdural hematoma  08/27/2017         ?  Deep venous thrombosis (HCC)           Previous medical history:      Past Medical History:        Diagnosis  Date         ?  Arthritis       ?  Deep venous thrombosis (HCC)       ?  Endocrine disease            hypothyroid         ?  Hemochromatosis           ?  Hypertension              Prior Level of Function/Home Situation:    Information was obtained by patient   Home environment:  Patient lives in an assisted living facility.   Prior level of function: needs assistance with ADLs, pt states she had been working with in house PT and had just resumed ambulating in the facility  with a Rollator after having been in a w/c for quite some time.   Home equipment: rollator, wheelchair, bathroom rails        Patient found:        Bed, (+) bed/chair exit alarm, (+) telemetry, (+) IV, (+) ICU/step down equipment   Most recent value for oxygen in flow sheets:   O2 Device: None (Room air) (02/07/21 1950)          Patient received/participated in 13 minutes of treatment (functional mobility training, bed mobility training, transfer training, therapeutic activities) during /immediately following PT evaluation.        Cognitive Status:        Mental Status: Alert and oriented x 3   Communication: intact   Follows commands: intact; consistently follows commands   General Cognition: mildly impaired   Safety/Judgement: diminished situational and deficit awareness        Extremities Assessment:         Sensation:   Intact      Strength:     R LE grossly 3+/5, L LE grossly 3/5 except L hip flexion 2/5 which pt attributes to a previous surgery   Range Of Motion:   [facility] BLE          Therapeutic Activities; Functional Mobility and Balance Status:         Focus on functional mobility training, bed mobility training, transfer training, therapeutic activities. Provided cueing for hand placement, technique, and safety.          Bed Mobility:      Functional Status     Comments         Supine to sit  max assist  Sit to supine  max assist       Rolling  mod assist           Scooting  max assist, bed in Trendelenberg position                    Functional Balance               Static sitting    Poor static: patient requires handhold support  and moderate to maximal assistance to maintain position; pt tends to lean/push posteriorly and can correct for a few moments with cuing, but resumes to posterior lean         Dynamic sitting    Poor dynamic: patient unable to accept challenge or move without loss of balance          Therapeutic Exercises:            Therapeutic Exercises   performed a few reps of each, gentle AA/AROM bilateral LE:    ankle pumps, short arc quads (SAQ), long arc quads (LAQ)           Balance   Activities/   Neuromuscular Re-Education   Worked to improve reactive postural responses: stable surface,  sitting edge of bed,  8 minutes, emphasis on keeping weight anteriorly .     Required tmod -max assista and tactile cues for balance support/correction and verbal cues for safety and technique.           Activity Tolerance:        - O2 sats 97% o room air   - BP 120/72, HR 78 bpm   - vitals stable   - no apparent distress        Final Location:        Positioned in bed, all needs within reach. Patient agrees to call for assistance. Head of bed elevated. Positioned with pillows for comfort. (+) bed alarm. (+) nurse notified.        Communication/Education:        Education: OOB with assist from staff, OOB to chair for meals and mobilize as tolerated, role of PT, PT plan of care, reviewed previously taught  information, all questions answered.      Education provided to: patient     Opportunity for questions and clarification was provided.      Readiness to learn indicated by: demonstrated understanding      Barriers to learning/limitations:  None      Comprehension: Patient communicated comprehension         Thank you for this referral.   Bernarda CHRISTELLA Ora, PT, DPT

## 2021-02-08 NOTE — Progress Notes (Signed)
SPEECH- LANGUAGE PATHOLOGY NOTE   Patient: [name] [name] (87 y.o. female) Aug 24, 1933  Room: 4322/4322  Primary Diagnosis: Stroke [facility]) [I63.9]  CVA (cerebral vascular accident) (HCC) [I63.9]  Atrial fibrillation (HCC) [I48.91]    Procedure(s) (LRB):  ESOPHAGOGASTRODUODENOSCOPY (EGD) (N/A) 1 Day Post-Op  PMH:   Past Medical History:   Diagnosis Date    Arthritis     Deep venous thrombosis (HCC)     Endocrine disease     hypothyroid    Hemochromatosis     Hypertension        Date: 02/08/2021   Time:  10:52     Attempted dysphagia tx, unable to complete 2*  pt in transit to the 6th floor.   Nurse reports tolerating current diet. SLP will continue to follow per POC as appropriate.     Thank you,      Kellie Shropshire MS CCC-SLP  Speech-Language Pathologist  [phone] or Secure Chat

## 2021-02-08 NOTE — Progress Notes (Signed)
ICU PROGRESS NOTE    Name: [name] [name] MRN: [id number]   DOB: January 08, 1934 Hospital: [facility]   Date: 02/08/2021  Admission Date: 02/06/2021       IMPRESSION / PLAN:   Acute CVA secondary to Right M1 occlusion (presumed due to A. Fib) s/p thrombectomy with TICI 3 reperfusion.  Continue PT OT and speech therapy.  Neurosurgery notes seen.  Recommendation noted.  Left hemiparesis.  Continue PT OT.  Dysphagia.  Tolerating pur??ed diet.  Continue speech therapy.  Chronic atrial fibrillation.  Off anticoagulation because of fall risk.  Essential hypertension.  Hemochromatosis.  Hypothyroidism.  Hypomagnesemia.  IV magnesium sulfate per protocol.  Continue close monitoring.  Anemia chronic.  H&H 9.4 and 28.6 respectively.  History of DVT.  DNR status per family's request  Plan  Continue present therapy  Transfer out to the telemetry bed.  Continue to monitor electrolytes and renal function  Continue DVT prophylaxis  Patient's condition is guarded  Discussed condition with RN and patient's family at bedside.  PT OT for muscle strengthening and gait training.  Speech therapy.  Home upon a stabilizing her condition.     Chart and notes reviewed. Data reviewed. I have evaluated all findings.  [x] I have reviewed the flowsheet and previous day???s notes.  [] The patient is unable to give any meaningful history or review of systems because the patient is:  [] Intubated [] Sedated   [] Unresponsive      [x] The patient is critically ill on  acute CVA with left hemiparesis.    [] Mechanical ventilation [x] Pressors   [] BiPAP []                  ROS: Left-sided weakness.    Events and notes from last 24 hours reviewed. Care plan discussed on multidisciplinary rounds.  Patient Active Problem List   Diagnosis Code    Deep venous thrombosis (HCC) I82.409    Subdural hematoma caused by concussion S06.5XAA    Subdural hematoma S06.5XAA    Metabolic encephalopathy G93.41    Contusion of left hip S70.02XA    Left elbow  contusion S50.02XA    Multiple falls R29.6    Atrial fibrillation (HCC) I48.91    Other partial intestinal obstruction (HCC) K56.690    COVID-19 U07.1    Stroke (HCC) I63.9    CVA (cerebral vascular accident) (HCC) I63.9       Vital Signs:  Visit Vitals  BP 132/70   Pulse 76   Temp 98.8 ??F (37.1 ??C)   Resp 19   Ht 5\' 3"  (1.6 m)   Wt 71 kg (156 lb 8.4 oz)   SpO2 98%   BMI 27.73 kg/m??       O2 Device: None (Room air)       Temp (24hrs), Avg:98 ??F (36.7 ??C), Min:97 ??F (36.1 ??C), Max:98.8 ??F (37.1 ??C)       Intake/Output:   Last shift:      No intake/output data recorded.  Last 3 shifts: 11/24 1901 - 11/26 0700  In: 1482.7 [P.O.:440; I.V.:1042.7]  Out: 1320 [Urine:1320]    Intake/Output Summary (Last 24 hours) at 02/08/2021  Last data filed at 02/08/2021 0600  Gross per 24 hour   Intake 440 ml   Output 725 ml   Net -285 ml          Ventilator Settings:                Current Facility-Administered Medications   Medication Dose Route  Frequency    magnesium sulfate 3 g in 0.9% sodium chloride 100 mL IVPB  3 g IntraVENous ONCE    apixaban (ELIQUIS) tablet 5 mg  5 mg Oral BID    [Held by provider] furosemide (LASIX) tablet 20 mg  20 mg Oral DAILY    [Held by provider] hydrOXYchloroQUINE (PLAQUENIL) tablet 200 mg  200 mg Oral BID    [Held by provider] lactulose (CHRONULAC) 10 gram/15 mL solution 30 mL  20 g Oral BID    [Held by provider] levothyroxine (SYNTHROID) tablet 125 mcg  125 mcg Oral ACB    [Held by provider] lisinopriL (PRINIVIL, ZESTRIL) tablet 10 mg  10 mg Oral DAILY    [Held by provider] metoprolol tartrate (LOPRESSOR) tablet 25 mg  25 mg Oral Q8H    [Held by provider] potassium chloride (K-DUR, KLOR-CON M20) SR tablet 40 mEq  40 mEq Oral DAILY    [Held by provider] rifAXIMin (XIFAXAN) tablet 550 mg  550 mg Oral BID    NOREPINephrine (LEVOPHED) 8 mg in 0.9% NS infusion  0.5-16 mcg/min IntraVENous TITRATE    niCARdipine (CARDENE) 50 mg in 0.9% sodium chloride 250 mL infusion  0-15 mg/hr IntraVENous  TITRATE       Telemetry: Normal sinus rhythm, no arrythima    Physical Exam:   General:  Alert and oriented x3, cooperative, no distress, appears stated age.   Head:  Normocephalic, without obvious abnormality, atraumatic.   Eyes:  Conjunctivae/corneas clear. PERRL, EOMs intact.   Nose: Nares normal. Septum midline. Mucosa normal. No drainage or sinus tenderness.   Throat: Lips, mucosa, and tongue normal. Teeth and gums normal.   Neck: Supple, symmetrical, trachea midline, no adenopathy, thyroid: no enlargement/tenderness/nodules, no carotid bruit and no JVD.   Back:   Symmetric, no curvature. ROM normal. No CVA tenderness.   Lungs:   Clear to auscultation bilaterally.   Chest wall:  No tenderness or deformity.   Heart:  Regular rate and rhythm, S1, S2 normal, no murmur, click, rub or gallop.   Abdomen:   Soft, non-tender. Bowel sounds normal. No masses,  No organomegaly. Rectal deferred.   Lympathic: Within normal limits.   Vascular: Within normal limits   Extremities: Extremities normal, atraumatic, no cyanosis or edema.   Pulses: 2+ and symmetric all extremities.   Neurologic: Acute CVA with left hemiparesis.   Mental Status:   Alert and oriented x3, no suicidal, homicidal or hallucinatory ideation   Skin: Good turgor, no rash or lesions      DATA:  MAR reviewed and pertinent medications noted or modified as needed     Labs:  Recent Labs     02/08/21  0400 02/07/21  0122 02/06/21  0745   WBC 12.0* 8.7 8.8   HGB 9.6* 9.4* 12.2*   HCT 28.5* 28.6* 36.4*   PLT 174 160 191       Recent Labs     02/08/21  0400 02/07/21  0122 02/06/21  0745   NA 141 141 139   K 3.9 3.8 3.9   CL 110* 111* 108*   CO2 21 22 22    GLU 110* 118* 143*   BUN 6* 9 12   CREA 0.57 0.67 0.88   CA 8.5* 8.4* 9.2   MG 1.5* 1.6  --    PHOS 2.8  --   --    ALB  --  1.5* 3.0*   ALT  --  9* 9*   INR  --   --  1.2*       No results for input(s): PH, PCO2, PO2, HCO3, FIO2 in the last 72 hours.  No results for input(s): FIO2I, IFO2, HCO3I, IHCO3, HCOPOC,  PCO2I, PCOPOC, IPHI, PHI, PHPOC, PO2I, PO2POC in the last 72 hours.    No lab exists for component: IPOC2  Imaging:  [x]   I have personally reviewed the patient???s radiographs and reports    High complexity decision making was performed during this consultation and evaluation.  [x]    Pt is at high risk for further organ failure and dysfunction.     Critical care time spent  minutes with patient exclusive of procedures 60.    , MD  02/08/2021  12:52 PM      Dragon medical dictation software was used for portions of this report. Unintended voice recognition errors may occur.

## 2021-02-08 NOTE — Progress Notes (Signed)
Progress Notes by Verlan Friends, MD at 02/08/21 [id number]                Author: Verlan Friends, MD  Service: Pulmonary Disease  Author Type: Physician       Filed: 02/08/21 1228  Date of Service: 02/08/21 0833  Status: Signed          Editor: Verlan Friends, MD (Physician)                  ICU Patient Progress Note           [facility]          Name:  [name] [name]        DOB:  [birth year]     MRN:  [id number]        Date:  02/08/2021       I have reviewed the flowsheet and previous days notes. Events, vitals, medications and notes from last 24 hours reviewed. Care plan discussed on multidisciplinary rounds.        IMPRESSION:     ??    Acute right MCA CVA S/P mechanical thrombectomy on 02/06/21. Follow up CTA head demonstrated no large vessel occlusion. No large infraction    ??  Known history of chronic A. fib not on anticoagulation due to multiple falls resulting subdural hematoma in 2019   ??  History of chronic diastolic congestive heart failure.  Last echo demonstrated LVEF around 70% and RVSP 43   ??  History of COVID infection in August 2022   ??  History of hypertension   ??  History of rheumatoid arthritis on methotrexate and Plaquenil   ??  History of hemochromatosis with cirrhosis   ??  History of thyroid cancer   ??  History of urinary retention takes Flomax   ??  History of small bowel obstruction due to periumbilical hernia in August 2022   ??  Anemia, current Hb 9.6   ??  Mild Leukocytosis         PLAN:     ??    On RA. May consider supplemental oxygen. Target oxygen saturation >90%   ??  Neurochecks as per ICU protocol.  Keep head of the bed elevated.  If there is any acute changes obtain stat CT of the head without contrast   ??  Target SBP between 100 mg  to 140 mm hg    ??  Awaiting MRI of brain    ??  Follow SLP recommendation: Puree and nectar thick liquid. Start oral home medication.   ??  Electrolyte replacement as per protocol   ??  Management of  hyperglycemia as per Glucomander protocol.   ??  DVT prophylaxis: Eliquis 5 mg twice a day. Follow Hb closely    ??  DNR CODE STATUS   ??  Transfer to neurology floor. Will sign off once patient is out of the ICU           Allergy:     Allergies        Allergen  Reactions         ?  Shellfish Derived  Hives         ?  Sulfa (Sulfonamide Antibiotics)  Hives                Current Facility-Administered Medications             Medication  Dose  Route  Frequency  Provider  Last Rate  Last Admin              ?  magnesium sulfate 3 g in 0.9% sodium chloride 100 mL IVPB   3 g  IntraVENous  ONCE  Verlan Friends, MD           ?  saline peripheral flush soln 10-20 mL   10-20 mL  IntraVENous  PRN  Mauser, Bernadene Bell, MD     10 mL at 02/07/21 1100     ?  apixaban (ELIQUIS) tablet 5 mg   5 mg  Oral  BID  Steffanie Dunn, MD     5 mg at 02/07/21 2023     ?  albuterol-ipratropium (DUO-NEB) 2.5 MG-0.5 MG/3 ML   3 mL  Nebulization  Q4H PRN  Mauser, Bernadene Bell, MD           ?  Lisbeth Ply by provider] furosemide (LASIX) tablet 20 mg   20 mg  Oral  DAILY  Mauser, Bernadene Bell, MD           ?  Lisbeth Ply by provider] hydrOXYchloroQUINE (PLAQUENIL) tablet 200 mg   200 mg  Oral  BID  Mauser, Bernadene Bell, MD           ?  Lisbeth Ply by provider] lactulose (CHRONULAC) 10 gram/15 mL solution 30 mL   20 g  Oral  BID  Mauser, Bernadene Bell, MD           ?  Lisbeth Ply by provider] levothyroxine (SYNTHROID) tablet 125 mcg   125 mcg  Oral  ACB  Mauser, Bernadene Bell, MD           ?  Lisbeth Ply by provider] lisinopriL (PRINIVIL, ZESTRIL) tablet 10 mg   10 mg  Oral  DAILY  Mauser, Bernadene Bell, MD           ?  Lisbeth Ply by provider] metoprolol tartrate (LOPRESSOR) tablet 25 mg   25 mg  Oral  Q8H  Mauser, Bernadene Bell, MD           ?  [Held by provider] potassium chloride (K-DUR, KLOR-CON M20) SR tablet 40 mEq   40 mEq  Oral  DAILY  Mauser, Bernadene Bell, MD           ?  Lisbeth Ply by provider] rifAXIMin [employer]) tablet 550 mg   550 mg  Oral  BID  Mauser, Bernadene Bell, MD                    ?  CALCIUM ELECTROLYTE  REPLACEMENT PROTOCOL STANDARD DOSING   1 Each  Other  PRN  Mauser, Bernadene Bell, MD                    ?  POTASSIUM ELECTROLYTE REPLACEMENT PROTOCOL STANDARD DOSING   1 Each  Other  PRN  Mauser, Bernadene Bell, MD           ?  MAGNESIUM ELECTROLYTE REPLACEMENT PROTOCOL STANDARD DOSING   1 Each  Other  PRN  Mauser, Bernadene Bell, MD           ?  NOREPINephrine (LEVOPHED) 8 mg in 0.9% NS infusion   0.5-16 mcg/min  IntraVENous  TITRATE  Leodis Binet, MD     Stopped at 02/07/21 1343     ?  niCARdipine (CARDENE) 50 mg in 0.9% sodium chloride 250 mL infusion   0-15 mg/hr  IntraVENous  TITRATE  Leodis Binet, MD  Held at 02/06/21 1200              ?  ondansetron (ZOFRAN) injection 4 mg   4 mg  IntraVENous  Q6H PRN  Mauser, Bernadene Bell, MD                  Past Medical History:     Past Medical History:        Diagnosis  Date         ?  Arthritis       ?  Deep venous thrombosis (HCC)       ?  Endocrine disease            hypothyroid         ?  Hemochromatosis           ?  Hypertension              Review of Systems    Constitutional: Negative.     HENT: Negative.      Eyes: Negative.     Respiratory: Negative.      Cardiovascular: Negative.     Gastrointestinal: Negative.     Genitourinary: Negative.     Musculoskeletal: Negative.     Skin: Negative.     Neurological: Negative.     Endo/Heme/Allergies: Negative.     Psychiatric/Behavioral: Negative.         Vital Signs:       Visit Vitals   BP  132/70      Pulse  76      Temp  98.8 ??F (37.1 ??C)      Resp  19      Ht  5\' 3"  (1.6 m)      Wt  71 kg (156 lb 8.4 oz)      SpO2  98%      BMI  27.73 kg/m??                O2 Device: None (Room air)             Temp (24hrs), Avg:98 ??F (36.7 ??C), Min:97 ??F (36.1 ??C), Max:98.8 ??F (37.1 ??C)           Intake/Output:    Last shift:      No intake/output data recorded.   Last 3 shifts: 11/24 1901 - 11/26 0700   In: 1482.7 [P.O.:440; I.V.:1042.7]   Out: 1320 [Urine:1320]      Intake/Output Summary (Last 24 hours) at 02/08/2021 0834   Last data filed at  02/08/2021 0600     Gross per 24 hour        Intake  440 ml        Output  725 ml        Net  -285 ml                 Physical Exam   Vitals and nursing note reviewed.     HENT:       Mouth/Throat:       Mouth: Mucous membranes are moist.    Eyes:       Pupils: Pupils are equal, round, and reactive to light.     Cardiovascular:       Rate and Rhythm: Regular rhythm.    Pulmonary:       Effort: Pulmonary effort is normal.       Breath sounds: Normal breath sounds.     Abdominal:       General:  Bowel sounds are normal.       Palpations: Abdomen is soft.    Skin:      General: Skin is warm and dry.    Neurological:       Mental Status: She is alert and oriented to person, place, and time.       Comments: Left side minimal weakness           DATA:      Current Facility-Administered Medications          Medication  Dose  Route  Frequency           ?  magnesium sulfate 3 g in 0.9% sodium chloride 100 mL IVPB   3 g  IntraVENous  ONCE     ?  saline peripheral flush soln 10-20 mL   10-20 mL  IntraVENous  PRN     ?  apixaban (ELIQUIS) tablet 5 mg   5 mg  Oral  BID     ?  albuterol-ipratropium (DUO-NEB) 2.5 MG-0.5 MG/3 ML   3 mL  Nebulization  Q4H PRN     ?  [Held by provider] furosemide (LASIX) tablet 20 mg   20 mg  Oral  DAILY     ?  [Held by provider] hydrOXYchloroQUINE (PLAQUENIL) tablet 200 mg   200 mg  Oral  BID     ?  [Held by provider] lactulose (CHRONULAC) 10 gram/15 mL solution 30 mL   20 g  Oral  BID     ?  [Held by provider] levothyroxine (SYNTHROID) tablet 125 mcg   125 mcg  Oral  ACB     ?  [Held by provider] lisinopriL (PRINIVIL, ZESTRIL) tablet 10 mg   10 mg  Oral  DAILY     ?  [Held by provider] metoprolol tartrate (LOPRESSOR) tablet 25 mg   25 mg  Oral  Q8H     ?  [Held by provider] potassium chloride (K-DUR, KLOR-CON M20) SR tablet 40 mEq   40 mEq  Oral  DAILY     ?  [Held by provider] rifAXIMin (XIFAXAN) tablet 550 mg   550 mg  Oral  BID     ?  CALCIUM ELECTROLYTE REPLACEMENT PROTOCOL STANDARD DOSING   1  Each  Other  PRN     ?  POTASSIUM ELECTROLYTE REPLACEMENT PROTOCOL STANDARD DOSING   1 Each  Other  PRN     ?  MAGNESIUM ELECTROLYTE REPLACEMENT PROTOCOL STANDARD DOSING   1 Each  Other  PRN           ?  NOREPINephrine (LEVOPHED) 8 mg in 0.9% NS infusion   0.5-16 mcg/min  IntraVENous  TITRATE           ?  niCARdipine (CARDENE) 50 mg in 0.9% sodium chloride 250 mL infusion   0-15 mg/hr  IntraVENous  TITRATE           ?  ondansetron (ZOFRAN) injection 4 mg   4 mg  IntraVENous  Q6H PRN           Labs:     Recent Results (from the past 24 hour(s))     GLUCOSE, POC          Collection Time: 02/07/21 11:06 AM         Result  Value  Ref Range            Glucose (POC)  121 (H)  65 - 105 mg/dL  GLUCOSE, POC          Collection Time: 02/07/21  6:18 PM         Result  Value  Ref Range            Glucose (POC)  149 (H)  65 - 105 mg/dL       GLUCOSE, POC          Collection Time: 02/08/21 12:13 AM         Result  Value  Ref Range            Glucose (POC)  149 (H)  65 - 105 mg/dL       CBC WITH AUTOMATED DIFF          Collection Time: 02/08/21  4:00 AM         Result  Value  Ref Range            WBC  12.0 (H)  4.0 - 11.0 1000/mm3       RBC  3.05 (L)  3.60 - 5.20 M/uL       HGB  9.6 (L)  13.0 - 17.2 gm/dl       HCT  16.1 (L)  09.6 - 50.0 %       MCV  93.4  80.0 - 98.0 fL       MCH  31.5  25.4 - 34.6 pg       MCHC  33.7  30.0 - 36.0 gm/dl       PLATELET  045  409 - 450 1000/mm3       MPV  11.3 (H)  6.0 - 10.0 fL       RDW-SD  45.1  36.4 - 46.3         NRBC  0  0 - 0         IMMATURE GRANULOCYTES  0.5  0.0 - 3.0 %       NEUTROPHILS  71.5 (H)  34 - 64 %       LYMPHOCYTES  18.0 (L)  28 - 48 %       MONOCYTES  8.0  1 - 13 %       EOSINOPHILS  1.5  0 - 5 %       BASOPHILS  0.5  0 - 3 %       METABOLIC PANEL, BASIC          Collection Time: 02/08/21  4:00 AM         Result  Value  Ref Range            Potassium  3.9  3.5 - 5.1 mEq/L       Chloride  110 (H)  98 - 107 mEq/L       Sodium  141  136 - 145 mEq/L       CO2  21  20 -  31 mEq/L       Glucose  110 (H)  74 - 106 mg/dl       BUN  6 (L)  9 - 23 mg/dl       Creatinine  8.11  0.55 - 1.02 mg/dl       GFR est AA  >91.4          GFR est non-AA  >60          Calcium  8.5 (L)  8.7 - 10.4 mg/dl       Anion gap  10  5 - 15 mmol/L       MAGNESIUM          Collection Time: 02/08/21  4:00 AM         Result  Value  Ref Range            Magnesium  1.5 (L)  1.6 - 2.6 mg/dL       PHOSPHORUS          Collection Time: 02/08/21  4:00 AM         Result  Value  Ref Range            Phosphorus  2.8  2.4 - 5.1 mg/dL       GLUCOSE, POC          Collection Time: 02/08/21  6:24 AM         Result  Value  Ref Range            Glucose (POC)  115 (H)  65 - 105 mg/dL       GLUCOSE, POC          Collection Time: 02/08/21  8:13 AM         Result  Value  Ref Range            Glucose (POC)  117 (H)  65 - 105 mg/dL              No results for input(s): FIO2I, IFO2, HCO3I, IHCO3, HCOPOC, PCO2I, PCOPOC, IPHI, PHI, PHPOC, PO2I, PO2POC in the last 72 hours.      No lab exists for component: IPOC2     All Micro Results              None                   Imaging:   [x] I  have personally reviewed the patients chest radiographs images and report with the  patient   Results from Hospital Encounter encounter on 02/06/21      XR ABD (KUB)      Narrative   Exam: XR ABD (KUB)      Indication: dobhoff placement   Additional information: None.      COMPARISON: None      Impression   FINDINGS/IMPRESSION:      Small bowel feeding tube tip resides in the distal esophagus. Large amount of   tubing coils in the upper esophagus. Recommend repositioning.      Electronically signed by: 02/08/21, MD 02/06/2021 5:16 PM EST         Results from Hospital Encounter encounter on 02/06/21      CTA NECK      Narrative   CTA HEAD, CTA NECK      INDICATION: headache. Status post recent right MCA thrombectomy.      COMPARISON: CTA head/neck same day      TECHNIQUE: CTA of the head and neck was performed with  intravenous contrast.   Multiplanar  two-dimensional and maximum intensity projection reformats performed   and reviewed. To expedite the results of stroke protocol patients, volumetric   3-D imaging was not included at the time of dictation.      All CT exams at this facility use one or more dose reduction techniques   including automatic exposure control, mA/kV adjustment per patient's size, or   iterative reconstruction technique.      CAROTID STENOSIS REFERENCE USING NASCET CRITERIA      % Stenosis = (  1 - narrowest diameter/diameter of distal artery) x100   Mild: < 50% stenosis   Moderate: 50-69% stenosis.   Severe: 70-94% stenosis.   Near occlusion: 95-99% stenosis.   Occluded: 100% stenosis      FINDINGS:      Head Vasculature: There has been interval thrombectomy of the right MCA. The   right MCA currently remains patent and perfuses the right MCA territory as   expected, grossly symmetrical compared to the left MCA territory. Left MCA   remains patent. Anterior and posterior cerebral arteries remain patent.   Vertebrobasilar system is patent. No discrete aneurysm or AVM.      Neck Vasculature: There is no hemodynamically significant stenosis within the   carotid or vertebral arteries. There is no evidence of dissection.      Brain: No findings to suggest acute territorial infarction. No parenchymal   hemorrhage. Mild generalized cerebral atrophy and periventricular chronic   microvascular changes.      Other: Some small volume gas within several venous structures involving the   right face and neck, likely secondary to recent procedure.      Upper lungs: Trace bilateral pleural effusions. Ascending aortic ectasia   measuring up to 4.5 cm.      Impression   IMPRESSION:      1.  Status post right MCA thrombectomy. Right MCA currently remains widely   patent.   2.  No large vessel occlusion in the head or neck. No evidence of dissection.   3.  No parenchymal hemorrhage or evidence of large territorial infarction.      Electronically signed by:  Linus Galas, MD 02/06/2021 3:22 PM EST             See  my orders for details      My assessment, plan of care, findings,  medications, side effects etc were discussed with:       nursing   PT/OT       respiratory therapy  Dr.         family           Total critical care time exclusive of procedures 31 m inutes with complex decision making performed and > 50% time spent in face to face evaluation.      Verlan Friends, MD

## 2021-02-09 LAB — CBC WITH AUTOMATED DIFF
BASOPHILS: 0.3 % (ref 0–3)
EOSINOPHILS: 1.8 % (ref 0–5)
HCT: 27.4 % — ABNORMAL LOW (ref 37.0–50.0)
HGB: 9 gm/dl — ABNORMAL LOW (ref 13.0–17.2)
IMMATURE GRANULOCYTES: 0.4 % (ref 0.0–3.0)
LYMPHOCYTES: 25.3 % — ABNORMAL LOW (ref 28–48)
MCH: 31 pg (ref 25.4–34.6)
MCHC: 32.8 gm/dl (ref 30.0–36.0)
MCV: 94.5 fL (ref 80.0–98.0)
MONOCYTES: 9.7 % (ref 1–13)
MPV: 11.2 fL — ABNORMAL HIGH (ref 6.0–10.0)
NEUTROPHILS: 62.5 % (ref 34–64)
NRBC: 0 (ref 0–0)
PLATELET: 178 10*3/uL (ref 140–450)
RBC: 2.9 M/uL — ABNORMAL LOW (ref 3.60–5.20)
RDW-SD: 46.3 (ref 36.4–46.3)
WBC: 9.4 10*3/uL (ref 4.0–11.0)

## 2021-02-09 LAB — METABOLIC PANEL, COMPREHENSIVE
ALT (SGPT): 8 U/L — ABNORMAL LOW (ref 10–49)
AST (SGOT): 15 U/L (ref 0.0–33.9)
Albumin: 2.6 gm/dl — ABNORMAL LOW (ref 3.4–5.0)
Alk. phosphatase: 82 U/L (ref 46–116)
Anion gap: 9 mmol/L (ref 5–15)
BUN: 9 mg/dl (ref 9–23)
Bilirubin, total: 0.8 mg/dl (ref 0.30–1.20)
CO2: 22 mEq/L (ref 20–31)
Calcium: 8.2 mg/dl — ABNORMAL LOW (ref 8.7–10.4)
Chloride: 111 mEq/L — ABNORMAL HIGH (ref 98–107)
Creatinine: 0.54 mg/dl — ABNORMAL LOW (ref 0.55–1.02)
GFR est AA: >60
GFR est non-AA: >60
Glucose: 102 mg/dl (ref 74–106)
Potassium: 4.1 mEq/L (ref 3.5–5.1)
Protein, total: 5 gm/dl — ABNORMAL LOW (ref 5.7–8.2)
Sodium: 142 mEq/L (ref 136–145)

## 2021-02-09 LAB — GLUCOSE, POC
Glucose (POC): 122 mg/dL — ABNORMAL HIGH (ref 65–105)
Glucose (POC): 113 mg/dL — ABNORMAL HIGH (ref 65–105)
Glucose (POC): 146 mg/dL — ABNORMAL HIGH (ref 65–105)

## 2021-02-09 LAB — PHOSPHORUS
Phosphorus: 2.9 mg/dL (ref 2.4–5.1)
Phosphorus: 2.9 mg/dL (ref 2.4–5.1)

## 2021-02-09 LAB — MAGNESIUM
Magnesium: 1.8 mg/dL (ref 1.6–2.6)
Magnesium: 1.8 mg/dL (ref 1.6–2.6)

## 2021-02-09 LAB — COMPREHENSIVE METABOLIC PANEL
ALT: 8 U/L — ABNORMAL LOW (ref 10–49)
AST: 15 U/L (ref 0.0–33.9)
Albumin: 2.6 gm/dl — ABNORMAL LOW (ref 3.4–5.0)
Alkaline Phosphatase: 82 U/L (ref 46–116)
Anion Gap: 9 mmol/L (ref 5–15)
BUN: 9 mg/dl (ref 9–23)
CO2: 22 mEq/L (ref 20–31)
Calcium: 8.2 mg/dl — ABNORMAL LOW (ref 8.7–10.4)
Chloride: 111 mEq/L — ABNORMAL HIGH (ref 98–107)
Creatinine: 0.54 mg/dl — ABNORMAL LOW (ref 0.55–1.02)
EGFR IF NonAfrican American: >60
GFR African American: >60
Glucose: 102 mg/dl (ref 74–106)
Potassium: 4.1 mEq/L (ref 3.5–5.1)
Sodium: 142 mEq/L (ref 136–145)
Total Bilirubin: 0.8 mg/dl (ref 0.30–1.20)
Total Protein: 5 gm/dl — ABNORMAL LOW (ref 5.7–8.2)

## 2021-02-09 LAB — CBC WITH AUTO DIFFERENTIAL
Basophils %: 0.3 % (ref 0–3)
Eosinophils %: 1.8 % (ref 0–5)
Hematocrit: 27.4 % — ABNORMAL LOW (ref 37.0–50.0)
Hemoglobin: 9 gm/dl — ABNORMAL LOW (ref 13.0–17.2)
Immature Granulocytes: 0.4 % (ref 0.0–3.0)
Lymphocytes %: 25.3 % — ABNORMAL LOW (ref 28–48)
MCH: 31 pg (ref 25.4–34.6)
MCHC: 32.8 gm/dl (ref 30.0–36.0)
MCV: 94.5 fL (ref 80.0–98.0)
MPV: 11.2 fL — ABNORMAL HIGH (ref 6.0–10.0)
Monocytes %: 9.7 % (ref 1–13)
Neutrophils %: 62.5 % (ref 34–64)
Nucleated RBCs: 0 (ref 0–0)
Platelets: 178 10*3/uL (ref 140–450)
RBC: 2.9 M/uL — ABNORMAL LOW (ref 3.60–5.20)
RDW-SD: 46.3 (ref 36.4–46.3)
WBC: 9.4 10*3/uL (ref 4.0–11.0)

## 2021-02-09 LAB — POCT GLUCOSE
POC Glucose: 122 mg/dL — ABNORMAL HIGH (ref 65–105)
POC Glucose: 113 mg/dL — ABNORMAL HIGH (ref 65–105)
POC Glucose: 146 mg/dL — ABNORMAL HIGH (ref 65–105)

## 2021-02-09 MED FILL — ELIQUIS 5 MG TABLET: 5 mg | ORAL | Qty: 1

## 2021-02-09 MED FILL — ATORVASTATIN 40 MG TAB: 40 mg | ORAL | Qty: 1

## 2021-02-09 MED FILL — ACETAMINOPHEN 500 MG TAB: 500 mg | ORAL | Qty: 2

## 2021-02-09 NOTE — Progress Notes (Signed)
SPEECH LANGUAGE PATHOLOGY   BEDSIDE DYSPHAGIA TREATMENT      Patient: [name] [name] (87 y.o. female) 01/05/1934  Room: 6628/6628  Primary Diagnosis: Stroke [facility]) [I63.9]  CVA (cerebral vascular accident) (HCC) [I63.9]  Atrial fibrillation (HCC) [I48.91]    Procedure(s) (LRB):  Procedure Cancelled/Not Performed - ESOPHAGOGASTRODUODENOSCOPY (EGD) (N/A) 2 Days Post-Op    Isolation:  There are currently no Active Isolations       MDRO: No current active infections  PPE: universal, N95, gloves  Precautions:  aspiration, fall, universal     Date: 02/09/2021  Start Time:  1220 End Time:  1230    TREATMENT:    Orientation: Pt awake, alert, pleasant, agreeable to participate in tx. Some confusion noted as she was asking if I attended [id number] [facility]  Respiratory Status: RA    Treatment: Swallow tx completed at b/s with items from current lunch tray. Pt was positioned ~90 degrees in bed and tray table and lunch items were placed in her reach. She was able to recall and explain that she is on "thick" liquids. No overt oral or pharyngeal s/s distress noted with current PO diet consistencies. OM ex reviewed with patient and mod I noted with exercises completed for lip/tongue strength. Goals are current; SLP will follow per POC.         Functional Oral Intake Scale: Level 5: Total oral diet of multiple consistencies but requiring special preparation or compensations    PLAN/ RECOMMENDATIONS:                 Diet Recommendations: continue IDDSI 4- puree, IDDSI 2 mildly thick (nectar)   Compensatory Swallow Strategies: HOB elevated 90 degrees as permitted, SMALL, SINGLE sips, small bites/ sip, slow rate of eating/ feeding, alternate solids/ liquids, repeat swallow, check oral cavity for residue after each meal, basic compensatory swallow strategies   Medication Administration: as tolerated   Aspiration Precautions: intermittent supervision with meals, meticulous oral care, suction set up  Risk(s) for Aspiration: advanced  age , medical condition, mental ability/ status, AMS, current level of dysphagia, acute CVA, brain lesions, head injury     Therapy Recommendations: pt would benefit from continuation of SLP for dysphagia, cognitive skills 3-5x/ week to address goals per POC    Therapy Prognosis: good    Discharge Recommendations: SNF  Other Recommended Services: OT, PT     SUBJECTIVE:    Patient Stated: "November." (Re: "what month is it?")   Pain Prior to Treatment: no pain reported, no pain s/s observed   Pain Following Treatment: no pain reported, no pain s/s observed     OBJECTIVE:    Position: HOB 90* in bed  Consistencies: mildly thick (nectar) via cup, puree (IDDSI 4) via spoon/fork  Oral Phase: WFL with no evidence of residual  Pharyngeal Phase: delayed initiation yet no immediate or delayed cough, throat clear, or change in vocal quality occurred following all trials; tolerance of puree/nectar-thick liquids noted for b/s dx-tx ex completed this date  Exercises:  Pt able to complete lingual and labial ROM with occasional verbal cues    Safety: Following session, pt  left in bed with bed rails as found; tray table with meal in reach of patient with call bell in reach   Modified Rankin Score (MRS): Defer to OT/ PT    EDUCATION:   Education Provided: role of SLP, POC, diet recommendations, and OMEx  Individual Educated: patient  Comprehension: pt communicated comprehension, pt demonstrated understanding, in agreement; however,  query pt's level carry over, and no family present  Staff Education: Educated Nurse and MD to POC, diet recommendations, and aspiration precautions.  All questions answered at this time.     Thank you,  Geroge Baseman. [place] MS Ed CCC-SLP

## 2021-02-09 NOTE — Progress Notes (Signed)
Progress Note             Daily Progress Note: 02/09/2021    Assessment/Plan:     Acute CVA secondary to Right M1 occlusion (presumed due to A. Fib) s/p thrombectomy with TICI 3 reperfusion.  Continue PT OT and speech therapy.  Recommendation noted.  Left hemiparesis.  Improving.  Continue PT OT.  Dysphagia.  Tolerating pur??ed diet.  Discussed with the speech therapist.  Chronic atrial fibrillation.  Off anticoagulation because of fall risk.  Essential hypertension.  Hemochromatosis.  Hypothyroidism.  Hypomagnesemia.    Continue close monitoring.  Anemia chronic.   History of DVT.  DNR status per family's request  Plan  Continue present therapy  Continue to monitor electrolytes and renal function  Continue DVT prophylaxis  Patient's condition is guarded  Discussed condition with RN and patient's family at bedside.  Discussed with the speech therapist.  PT OT for muscle strengthening and gait training.  Speech therapy.  Home upon a stabilizing her condition.       Subjective:   None offered    Review of Systems:  None offered    Objective:   Physical Exam:     Visit Vitals  BP 105/65 (BP 1 Location: Right upper arm, BP Patient Position: Supine)   Pulse 70   Temp 97.9 ??F (36.6 ??C)   Resp 17   Ht '5\' 3"'  (1.6 m)   Wt 71 kg (156 lb 8.4 oz)   SpO2 98%   BMI 27.73 kg/m??      O2 Device: None (Room air)    Temp (24hrs), Avg:98 ??F (36.7 ??C), Min:97.5 ??F (36.4 ??C), Max:98.8 ??F (37.1 ??C)    No intake/output data recorded.   11/25 1901 - 11/27 0700  In: 200 [P.O.:200]  Out: 800 [Urine:800]    General:  Alert and oriented x3, cooperative, no distress, appears stated age.   Head:  Normocephalic, without obvious abnormality, atraumatic.   Eyes:  Conjunctivae/corneas clear. PERRL, EOMs intact.   Nose: Nares normal. Septum midline. Mucosa normal. No drainage or sinus tenderness.   Throat: Lips, mucosa, and tongue normal. Teeth and gums normal.   Neck: Supple, symmetrical, trachea midline, no adenopathy, thyroid: no  enlargement/tenderness/nodules, no carotid bruit and no JVD.   Back:   Symmetric, no curvature. ROM normal. No CVA tenderness.   Lungs:   Clear to auscultation bilaterally.   Chest wall:  No tenderness or deformity.   Heart:  Regular rate and rhythm, S1, S2 normal, no murmur, click, rub or gallop.   Abdomen:   Soft, non-tender. Bowel sounds normal. No masses,  No organomegaly. Rectal deferred.   Lympathic: Within normal limits.   Vascular: Within normal limits   Extremities: Extremities normal, atraumatic, no cyanosis or edema.   Pulses: 2+ and symmetric all extremities.   Neurological: Left hemiparesis   Mental Status:   Alert and oriented x3, no suicidal, homicidal or hallucinatory ideation   Skin: Good turgor, no rash or lesions     Data Review:       24 Hour Results:  Recent Results (from the past 24 hour(s))   GLUCOSE, POC    Collection Time: 02/09/21 12:13 AM   Result Value Ref Range    Glucose (POC) 122 (H) 65 - 105 mg/dL   CBC WITH AUTOMATED DIFF    Collection Time: 02/09/21  3:36 AM   Result Value Ref Range    WBC 9.4 4.0 - 11.0 1000/mm3    RBC 2.90 (L)  3.60 - 5.20 M/uL    HGB 9.0 (L) 13.0 - 17.2 gm/dl    HCT 27.4 (L) 37.0 - 50.0 %    MCV 94.5 80.0 - 98.0 fL    MCH 31.0 25.4 - 34.6 pg    MCHC 32.8 30.0 - 36.0 gm/dl    PLATELET 178 140 - 450 1000/mm3    MPV 11.2 (H) 6.0 - 10.0 fL    RDW-SD 46.3 36.4 - 46.3      NRBC 0 0 - 0      IMMATURE GRANULOCYTES 0.4 0.0 - 3.0 %    NEUTROPHILS 62.5 34 - 64 %    LYMPHOCYTES 25.3 (L) 28 - 48 %    MONOCYTES 9.7 1 - 13 %    EOSINOPHILS 1.8 0 - 5 %    BASOPHILS 0.3 0 - 3 %   MAGNESIUM    Collection Time: 02/09/21  3:36 AM   Result Value Ref Range    Magnesium 1.8 1.6 - 2.6 mg/dL   PHOSPHORUS    Collection Time: 02/09/21  3:36 AM   Result Value Ref Range    Phosphorus 2.9 2.4 - 5.1 mg/dL   METABOLIC PANEL, COMPREHENSIVE    Collection Time: 02/09/21  3:36 AM   Result Value Ref Range    Potassium 4.1 3.5 - 5.1 mEq/L    Chloride 111 (H) 98 - 107 mEq/L    Sodium 142 136 - 145  mEq/L    CO2 22 20 - 31 mEq/L    Glucose 102 74 - 106 mg/dl    BUN 9 9 - 23 mg/dl    Creatinine 0.54 (L) 0.55 - 1.02 mg/dl    GFR est AA >60.0      GFR est non-AA >60      Calcium 8.2 (L) 8.7 - 10.4 mg/dl    Anion gap 9 5 - 15 mmol/L    AST (SGOT) 15.0 0.0 - 33.9 U/L    ALT (SGPT) 8 (L) 10 - 49 U/L    Alk. phosphatase 82 46 - 116 U/L    Bilirubin, total 0.80 0.30 - 1.20 mg/dl    Protein, total 5.0 (L) 5.7 - 8.2 gm/dl    Albumin 2.6 (L) 3.4 - 5.0 gm/dl   GLUCOSE, POC    Collection Time: 02/09/21  6:22 AM   Result Value Ref Range    Glucose (POC) 113 (H) 65 - 105 mg/dL   GLUCOSE, POC    Collection Time: 02/09/21 11:56 AM   Result Value Ref Range    Glucose (POC) 146 (H) 65 - 105 mg/dL       Problem List:  Problem List as of 02/09/2021 Date Reviewed: February 08, 2021            Codes Class Noted - Resolved    RUEAV-40 ICD-10-CM: U07.1  ICD-9-CM: 079.89  10/21/2020 - Present    Overview Signed 11/01/2020  1:27 PM by Lillia Dallas, MD     10/21/20 SARS CoV-2 detected             Stroke [facility]) ICD-10-CM: I63.9  ICD-9-CM: 434.91  2021/02/08 - Present        CVA (cerebral vascular accident) [facility]) ICD-10-CM: I63.9  ICD-9-CM: 434.91  February 08, 2021 - Present        Atrial fibrillation ([place]) ICD-10-CM: I48.91  ICD-9-CM: 427.31  10/31/2020 - Present    Overview Addendum 11/06/2020 11:15 AM by Lillia Dallas, MD     Noted this AM (10/31/20)  Rate controlled (  84 BPM)  No previously documented hx of AFIB  CHADS2VASc = 6  (08/27/17 CT Acute interhemispheric fissure subdural hemorrhage. Chronic small vessel ischemic change)  ECHO:  EF 75%< LV 25/44, S/PW 9/10, LAVI 28, Mild MR, Mod TR, RVSP 43             Other partial intestinal obstruction ([place]) ICD-10-CM: K56.690  ICD-9-CM: 560.89  10/31/2020 - Present    Overview Addendum 11/01/2020  1:28 PM by Lillia Dallas, MD     10/30/20 CT Small bowel obstruction secondary to presumed small bowel containing  umbilical hernia. Stranding within the hernia sac suggesting engorgement.  No  pneumatosis.  11/01/20 SURG We will continue to follow the patient to ensure she does not develop worsening incarceration and bowel obstruction from the hernia.  Currently if we can delay the surgery she would likely benefit from decreased risk of postoperative complications             Metabolic encephalopathy PFX-90-WI: G93.41  ICD-9-CM: 348.31  10/21/2020 - Present        Contusion of left hip ICD-10-CM: O97.35HG  ICD-9-CM: 924.01  10/21/2020 - Present        Left elbow contusion ICD-10-CM: S50.02XA  ICD-9-CM: 923.11  10/21/2020 - Present        Multiple falls ICD-10-CM: R29.6  ICD-9-CM: V15.88  10/21/2020 - Present        Deep venous thrombosis (H[facility]) ICD-10-CM: I82.409  ICD-9-CM: 453.40  Unknown - Present        Subdural hematoma caused by concussion ICD-10-CM: S06.5XAA  ICD-9-CM: 852.29  08/27/2017 - Present        Subdural hematoma ICD-10-CM: S06.5XAA  ICD-9-CM: 432.1  08/27/2017 - Present           Medications reviewed  Current Facility-Administered Medications   Medication Dose Route Frequency    atorvastatin (LIPITOR) tablet 40 mg  40 mg Oral QHS    acetaminophen (TYLENOL) tablet 1,000 mg  1,000 mg Oral Q6H PRN    saline peripheral flush soln 10-20 mL  10-20 mL IntraVENous PRN    apixaban (ELIQUIS) tablet 5 mg  5 mg Oral BID    albuterol-ipratropium (DUO-NEB) 2.5 MG-0.5 MG/3 ML  3 mL Nebulization Q4H PRN    [Held by provider] furosemide (LASIX) tablet 20 mg  20 mg Oral DAILY    [Held by provider] hydrOXYchloroQUINE (PLAQUENIL) tablet 200 mg  200 mg Oral BID    [Held by provider] lactulose (CHRONULAC) 10 gram/15 mL solution 30 mL  20 g Oral BID    [Held by provider] levothyroxine (SYNTHROID) tablet 125 mcg  125 mcg Oral ACB    [Held by provider] lisinopriL (PRINIVIL, ZESTRIL) tablet 10 mg  10 mg Oral DAILY    [Held by provider] metoprolol tartrate (LOPRESSOR) tablet 25 mg  25 mg Oral Q8H    [Held by provider] potassium chloride (K-DUR, KLOR-CON M20) SR tablet 40 mEq  40 mEq Oral DAILY    [Held by provider]  rifAXIMin (XIFAXAN) tablet 550 mg  550 mg Oral BID    ondansetron (ZOFRAN) injection 4 mg  4 mg IntraVENous Q6H PRN       Care Plan discussed with: RN    Total time spent with patient: 35 minutes.    Ashley Jacobs, MD  February 09, 2021    [facility] medical dictation software was used for portions of this report. Unintended voice recognition errors may occur.

## 2021-02-09 NOTE — Progress Notes (Signed)
Problem: Aspiration - Risk of  Goal: *Absence of aspiration  Outcome: Progressing Towards Goal     Problem: Pressure Injury - Risk of  Goal: *Prevention of pressure injury  Description: Document Braden Scale and appropriate interventions in the flowsheet.  Outcome: Progressing Towards Goal  Note: Pressure Injury Interventions:  Sensory Interventions: Assess changes in LOC         Activity Interventions: Increase time out of bed, Pressure redistribution bed/mattress(bed type)    Mobility Interventions: Pressure redistribution bed/mattress (bed type)    Nutrition Interventions: Document food/fluid/supplement intake    Friction and Shear Interventions: Lift sheet                Problem: Patient Education: Go to Patient Education Activity  Goal: Patient/Family Education  Outcome: Progressing Towards Goal

## 2021-02-10 LAB — CBC WITH AUTOMATED DIFF
BASOPHILS: 0.6 % (ref 0–3)
BASOPHILS: 0.5 % (ref 0–3)
EOSINOPHILS: 0.8 % (ref 0–5)
EOSINOPHILS: 1.3 % (ref 0–5)
HCT: 21.9 % — ABNORMAL LOW (ref 37.0–50.0)
HCT: 20 % — CL (ref 37.0–50.0)
HGB: 7 gm/dl — ABNORMAL LOW (ref 13.0–17.2)
HGB: 6.6 gm/dl — CL (ref 13.0–17.2)
IMMATURE GRANULOCYTES: 0.6 % (ref 0.0–3.0)
IMMATURE GRANULOCYTES: 0.8 % (ref 0.0–3.0)
LYMPHOCYTES: 14.2 % — ABNORMAL LOW (ref 28–48)
LYMPHOCYTES: 20.7 % — ABNORMAL LOW (ref 28–48)
MCH: 31 pg (ref 25.4–34.6)
MCH: 32 pg (ref 25.4–34.6)
MCHC: 32 gm/dl (ref 30.0–36.0)
MCHC: 33 gm/dl (ref 30.0–36.0)
MCV: 96.9 fL (ref 80.0–98.0)
MCV: 97.1 fL (ref 80.0–98.0)
MONOCYTES: 9.6 % (ref 1–13)
MONOCYTES: 12.8 % (ref 1–13)
MPV: 10.7 fL — ABNORMAL HIGH (ref 6.0–10.0)
MPV: 10.7 fL — ABNORMAL HIGH (ref 6.0–10.0)
NEUTROPHILS: 74.2 % — ABNORMAL HIGH (ref 34–64)
NEUTROPHILS: 63.9 % (ref 34–64)
NRBC: 0 (ref 0–0)
NRBC: 0 (ref 0–0)
PLATELET: 184 10*3/uL (ref 140–450)
PLATELET: 184 10*3/uL (ref 140–450)
RBC: 2.26 M/uL — ABNORMAL LOW (ref 3.60–5.20)
RBC: 2.06 M/uL — ABNORMAL LOW (ref 3.60–5.20)
RDW-SD: 47.8 — ABNORMAL HIGH (ref 36.4–46.3)
RDW-SD: 47.9 — ABNORMAL HIGH (ref 36.4–46.3)
WBC: 10.4 10*3/uL (ref 4.0–11.0)
WBC: 10.3 10*3/uL (ref 4.0–11.0)

## 2021-02-10 LAB — METABOLIC PANEL, BASIC
Anion gap: 6 mmol/L (ref 5–15)
BUN: 14 mg/dl (ref 9–23)
CO2: 24 mEq/L (ref 20–31)
Calcium: 8.4 mg/dl — ABNORMAL LOW (ref 8.7–10.4)
Chloride: 112 mEq/L — ABNORMAL HIGH (ref 98–107)
Creatinine: 0.58 mg/dl (ref 0.55–1.02)
GFR est AA: >60
GFR est non-AA: >60
Glucose: 145 mg/dl — ABNORMAL HIGH (ref 74–106)
Potassium: 4.5 mEq/L (ref 3.5–5.1)
Sodium: 142 mEq/L (ref 136–145)

## 2021-02-10 LAB — ANTIBODY SCREEN
Antibody Screen: NEGATIVE
Antibody screen: NEGATIVE

## 2021-02-10 LAB — MAGNESIUM
Magnesium: 1.6 mg/dL (ref 1.6–2.6)
Magnesium: 1.6 mg/dL (ref 1.6–2.6)

## 2021-02-10 LAB — GLUCOSE, POC
Glucose (POC): 151 mg/dL — ABNORMAL HIGH (ref 65–105)
Glucose (POC): 153 mg/dL — ABNORMAL HIGH (ref 65–105)

## 2021-02-10 LAB — FERRITIN
Ferritin: 50.1 ng/ml (ref 7.3–270.7)
Ferritin: 50.1 ng/ml (ref 7.3–270.7)

## 2021-02-10 LAB — AMMONIA
Ammonia, plasma: <17 umol/L (ref 11.2–31.7)
Ammonia: <17 umol/L (ref 11.2–31.7)

## 2021-02-10 LAB — PHOSPHORUS
Phosphorus: 3.5 mg/dL (ref 2.4–5.1)
Phosphorus: 3.5 mg/dL (ref 2.4–5.1)

## 2021-02-10 LAB — BLOOD TYPE, (ABO+RH)
ABO/Rh(D): O POS
ABO/Rh: O POS

## 2021-02-10 LAB — THROMBIN TIME
Thrombin Time: 18 seconds (ref 14–22)
Thrombin time: 18 seconds (ref 14–22)

## 2021-02-10 LAB — CBC WITH AUTO DIFFERENTIAL
Basophils %: 0.6 % (ref 0–3)
Basophils %: 0.5 % (ref 0–3)
Eosinophils %: 0.8 % (ref 0–5)
Eosinophils %: 1.3 % (ref 0–5)
Hematocrit: 21.9 % — ABNORMAL LOW (ref 37.0–50.0)
Hematocrit: 20 % — CL (ref 37.0–50.0)
Hemoglobin: 7 gm/dl — ABNORMAL LOW (ref 13.0–17.2)
Hemoglobin: 6.6 gm/dl — CL (ref 13.0–17.2)
Immature Granulocytes: 0.6 % (ref 0.0–3.0)
Immature Granulocytes: 0.8 % (ref 0.0–3.0)
Lymphocytes %: 14.2 % — ABNORMAL LOW (ref 28–48)
Lymphocytes %: 20.7 % — ABNORMAL LOW (ref 28–48)
MCH: 31 pg (ref 25.4–34.6)
MCH: 32 pg (ref 25.4–34.6)
MCHC: 32 gm/dl (ref 30.0–36.0)
MCHC: 33 gm/dl (ref 30.0–36.0)
MCV: 96.9 fL (ref 80.0–98.0)
MCV: 97.1 fL (ref 80.0–98.0)
MPV: 10.7 fL — ABNORMAL HIGH (ref 6.0–10.0)
MPV: 10.7 fL — ABNORMAL HIGH (ref 6.0–10.0)
Monocytes %: 9.6 % (ref 1–13)
Monocytes %: 12.8 % (ref 1–13)
Neutrophils %: 74.2 % — ABNORMAL HIGH (ref 34–64)
Neutrophils %: 63.9 % (ref 34–64)
Nucleated RBCs: 0 (ref 0–0)
Nucleated RBCs: 0 (ref 0–0)
Platelets: 184 10*3/uL (ref 140–450)
Platelets: 184 10*3/uL (ref 140–450)
RBC: 2.26 M/uL — ABNORMAL LOW (ref 3.60–5.20)
RBC: 2.06 M/uL — ABNORMAL LOW (ref 3.60–5.20)
RDW-SD: 47.8 — ABNORMAL HIGH (ref 36.4–46.3)
RDW-SD: 47.9 — ABNORMAL HIGH (ref 36.4–46.3)
WBC: 10.4 10*3/uL (ref 4.0–11.0)
WBC: 10.3 10*3/uL (ref 4.0–11.0)

## 2021-02-10 LAB — BASIC METABOLIC PANEL
Anion Gap: 6 mmol/L (ref 5–15)
BUN: 14 mg/dl (ref 9–23)
CO2: 24 mEq/L (ref 20–31)
Calcium: 8.4 mg/dl — ABNORMAL LOW (ref 8.7–10.4)
Chloride: 112 mEq/L — ABNORMAL HIGH (ref 98–107)
Creatinine: 0.58 mg/dl (ref 0.55–1.02)
EGFR IF NonAfrican American: >60
GFR African American: >60
Glucose: 145 mg/dl — ABNORMAL HIGH (ref 74–106)
Potassium: 4.5 mEq/L (ref 3.5–5.1)
Sodium: 142 mEq/L (ref 136–145)

## 2021-02-10 LAB — POCT GLUCOSE
POC Glucose: 151 mg/dL — ABNORMAL HIGH (ref 65–105)
POC Glucose: 153 mg/dL — ABNORMAL HIGH (ref 65–105)

## 2021-02-10 MED ORDER — HEPARIN 3,000 UNITS IN NORMAL SALINE 1000 ML
3000 units/1000 | INTRAVENOUS | Status: DC | PRN
Start: 2021-02-10 — End: 2021-02-10
  Administered 2021-02-06: 17:00:00 via INTRA_ARTERIAL

## 2021-02-10 MED ORDER — PROMETHAZINE IN NS 12.5 MG/50 ML IV PIGGY BAG
12.5 mg/50 ml | Freq: Four times a day (QID) | INTRAVENOUS | Status: AC | PRN
Start: 2021-02-10 — End: 2021-02-19

## 2021-02-10 MED ORDER — SODIUM CHLORIDE 0.9 % IV
INTRAVENOUS | Status: AC | PRN
Start: 2021-02-10 — End: 2021-02-18

## 2021-02-10 MED FILL — SODIUM CHLORIDE 0.9 % IV: INTRAVENOUS | Qty: 250

## 2021-02-10 MED FILL — ELIQUIS 5 MG TABLET: 5 mg | ORAL | Qty: 1

## 2021-02-10 MED FILL — ATORVASTATIN 40 MG TAB: 40 mg | ORAL | Qty: 1

## 2021-02-10 MED FILL — ONDANSETRON (PF) 4 MG/2 ML INJECTION: 4 mg/2 mL | INTRAMUSCULAR | Qty: 2

## 2021-02-10 NOTE — Progress Notes (Signed)
PHYSICAL THERAPY TREATMENT 1/1:     Patient: [name] [name] (87 y.o. female)  Room: 6628/6628    Primary Diagnosis: Stroke [facility]) [I63.9]  CVA (cerebral vascular accident) (HCC) [I63.9]  Atrial fibrillation (HCC) [I48.91]  Procedure(s) (LRB):  Procedure Cancelled/Not Performed - ESOPHAGOGASTRODUODENOSCOPY (EGD) (N/A) 3 Days Post-Op   Length of Stay:  4 day(s)   Insurance: Payor: [facility] / Plan: [facility] MEDICARE A & B / Product Type: Medicare /      Date: 02/10/2021  In time: 13:01  Out time:  13:25     Precautions: Falls. Nectar thick liquids, L sided weakness, SBP goal 100-1     Isolation:  There are currently no Active Isolations       MDRO: No active infections     Equipment: hospital bed      Assessment:      Based on the objective data described below, the patient presents with  - Modified Rankin Score (mRS): 4 - Severe disability; unable to walk without assistance and unable to attend to own bodily needs without assistance   - impaired mobility  - pt feeling nauseated at this session, declined edge of bed or out of bed activity  - active participation in therapeutic exercises for bilateral LEs  - decreased independence with functional mobility  - decreased tolerance to therapeutic exercises/activities  - high motivation to participate in PT to return to prior level of function  - progressing steadily towards PT goals    Recommendations:  Recommend continued physical therapy during acute stay. Physical Therapy, Occupational Therapy, and Speech Therapy. Recommend out of bed activity to counteract ill effects of bedrest, with assistance from staff as needed.  Discharge Recommendations: Skilled nursing facility (SNF): patient will benefit from further therapy at rehab facility to increase strength and endurance to return to prior level of function.  Further Equipment Recommendations for Discharge: DME needs to be determined at time of d/c from rehab.     Plan:      Patient will be followed by physical therapy  to address goals per initial plan of care.    Subjective:      Patient agreeable to work with PT, although states she feels sleepy and nauseous today  Patient reports mild R groin pain before treatment and exercise to R LE aggravated the pain.  Pt did not rate pain numerically.  Exercises for R LE were limited due to pain    Objective Data Summary:      Orders, labs, and chart reviewed on [place] [facility]. Communicated with patient's nurse (patient ok to be seen by PT).    Patient found:     Bed, (+) bed/chair exit alarm, (+) telemetry, (+) pure wick urine system    Cognitive Status:     Mental Status: Alert and oriented x 3.    Safety/Judgement: appropriate awareness of environment and need for assistance.    Therapeutic Activities; Functional Mobility and Balance Status:      Focus on functional mobility training, bed mobility training, therapeutic exercises, therapeutic activities. Provided cueing for hand placement, technique, and safety.      Bed mobility:    Functional Status   Comments   Supine to sit dependent    Sit to supine dependent    Scooting dependent      Therapeutic Exercises:      Therapeutic exercises  10 reps each, AROM LLE, gentle AA/AROM RLE:   ankle pumps, hip ab/duction in supine, short arc quads (SAQ),  glute sets, quad sets       Activity Tolerance:     - limited by nausea  - BP 116/61, HR 104 bpm  - vitals stable  - no apparent distress    Final Location:     Positioned in bed, all needs within reach. Patient agrees to call for assistance. Head of bed elevated. (+) bed alarm. (+) nurse notified. (+) care partner notified.      Communication/Education:     Education: activity as tolerated to counteract ill effects of bedrest, promote healing and return to prior level of function, pressure relief, disposition, role of PT, PT plan of care, all questions answered.    Education provided to: patient   Opportunity for questions and clarification was provided.    Readiness to learn indicated by:  demonstrated understanding    Barriers to learning/limitations:  None    Comprehension: Patient communicated comprehension    Thank you for this referral.  Heide Guile, PT, DPT

## 2021-02-10 NOTE — Progress Notes (Signed)
 SPEECH LANGUAGE PATHOLOGY   BEDSIDE DYSPHAGIA TREATMENT      Patient: [name] [name] (87 y.o. female) 08/21/1933  Room: 6628/6628  Primary Diagnosis: Stroke [facility]) [I63.9]  CVA (cerebral vascular accident) (HCC) [I63.9]  Atrial fibrillation (HCC) [I48.91]    Procedure(s) (LRB):  Procedure Cancelled/Not Performed - ESOPHAGOGASTRODUODENOSCOPY (EGD) (N/A) 3 Days Post-Op    Isolation:  There are currently no Active Isolations       MDRO: No current active infections  PPE: surgical mask, gloves  Precautions:  universal and aspiration     Date: 02/10/2021  Start Time:  7:46 End Time:  8:03    TREATMENT:    Orientation: Pt awake, alert, able to follow contextual commands   Respiratory Status: RA    Treatment: Swallow tx completed b/s with tolerance/ safety of IDDSI 4- puree, IDDSI 2 mildly thick (nectar) and therapeutic trials of IDDSI 6- soft & bite sized, IDDSI 5- minced/ moist for possible diet advancement.  Pt seen sitting up in bed upon SLPs arrival, alert and verbal.  Pt known to this SLP from previous facility.  Dx trials of mildly thick liquids, puree, minced/moist solids, and soft/bite sized solids presented with mildly prolonged oral prep/transit, mildly delayed swallow onset, and no overt s/s aspiration or change in breath sounds.  Recommend advance solids to IDDSI 6-soft/bite sized solids with mildly thick liquids.  Pt not appropriate for trials of thin liquids due to aspiration on previous MBS due to prominent cervical osteophytes with rec for long term mildly thick liquids with meals.          Functional Oral Intake Scale: Level 5: Total oral diet of multiple consistencies but requiring special preparation or compensations    PLAN/ RECOMMENDATIONS:                 Diet Recommendations: change solids to soft/ bite sized (IDDSI 6), continue mildly thick (nectar) liquids   Compensatory Swallow Strategies: HOB ~60* for all po feeds and >45* at least 30 minutes following, small bites/ sip, slow rate of eating/  feeding, alternate solids/ liquids   Medication Administration: one at a time with small, single sips mildly thick liquids or whole in puree   Aspiration Precautions: may feed self independently, assist with feeding as needed, feed only when awake and alert  Risk(s) for Aspiration: advanced age , medical condition, current level of dysphagia, history of dysphagia, history of aspiration, acute CVA, brain lesions, head injury     Therapy Recommendations: pt would benefit from continuation of SLP for dysphagia as per POC    Therapy Prognosis: good    Discharge Recommendations: to be determined  Other Recommended Services: OT, PT, Dietitian     SUBJECTIVE:    Patient Stated: Oh yeah.   Pain Prior to Treatment: no pain reported   Pain Following Treatment: no pain reported     OBJECTIVE:    Position: upright in bed  Consistencies: mildly thick (nectar) via cup, soft/ bite sized (IDDSI 6), minced/ moist (IDDSI 5), and puree (IDDSI 4)   Oral Phase: mild prolonged mastication  Pharyngeal Phase: delayed initiation and no overt/ soft/ silent s/sx aspiration noted following any trials  Exercises:  n/a    Safety: Following session, pt tray table, phone, and call bell within reach   Modified Rankin Score (MRS): Defer to OT/ PT    EDUCATION:   Education Provided: POC, diet recommendations, and compensatory strategies for swallowing  Individual Educated: patient  Comprehension: pt communicated comprehension and query pt's  level carry over  Staff Education: Educated Nurse to POC and diet recommendations.       Thank you,      Isaiah Lima MS CCC-SLP  Speech-Language Pathologist  [phone] or Secure Chat

## 2021-02-10 NOTE — Progress Notes (Signed)
Chaplain Follow-Up    Start Visit: 1100  End Visit: 1103    Chaplain conducted a Follow up consultation and Spiritual Assessment for [name] [name], who is a 87 y.o.,female.      The Chaplain provided the following Interventions:  Continued the relationship of care and support.   Chart reviewed.    The following outcomes were achieved:  Patient expressed gratitude for volunteer chaplain's visit.    Assessment:  There are no further spiritual or religious issues which require Pastoral Care Services interventions at this time.     Plan:  Chaplains will continue to follow and will provide pastoral care on an as needed/requested basis.  Chaplain charted for Barrister's clerk.    Nona Dell.   Pharmacologist Care   [phone]

## 2021-02-10 NOTE — Progress Notes (Signed)
 OCCUPATIONAL THERAPY EVALUATION     Patient: [name] [name] (87 y.o. female)  Room: 6628/6628  Primary Diagnosis: Stroke [facility]) [I63.9]  CVA (cerebral vascular accident) (HCC) [I63.9]  Atrial fibrillation (HCC) [I48.91]   Procedure(s) (LRB):  Procedure Cancelled/Not Performed - ESOPHAGOGASTRODUODENOSCOPY (EGD) (N/A) 3 Days Post-Op    Date: 02/10/2021  In time:  8:32        Out time:  9:11   Total Time: 39  Evaluation Time: 8  Treatment Time: 31    Isolation:  There are currently no Active Isolations         MDRO: No active infections  Precautions: Falls, Poor Safety Awareness, and L sided weakness, SBP 100-140 .  ADULT DIET Dysphagia - Soft & Bite Sized; Mildly Thick (Nectar)  Weight bearing precautions: None    Orders, labs, and chart reviewed. Communicated with nursing staff. Patient cleared to participate.    ASSESSMENT:      Based on the objective data described below, the patient presents with   - generalized muscle weakness affecting function in ADLs  - (left > right) upper extremity and (left > right) lower extremity weakness  - decreased functional sitting and standing balance    - decreased functional mobility   - unsteady in functional mobility, transfers, standing, and sitting   - decreased sitting and standing tolerance  - decreased tolerance to sustained activity  - deconditioning  - increased pain in R flank (large hematoma)   - decreased cognition   - decreased safety awareness  - decreased flexibility  - decreased ability to do cross leg technique for lower body dressing  - decreased coordination/prehension on Left  - retropulsion with L sided lean in sitting/standing   affecting patient's ability to safely and independently perform basic ADLs/IADLs.    Patient will benefit from skilled occupational therapy intervention to address the above impairments.    Patient's rehabilitation potential is considered to be Good.     Modified Rankin Score (mRS): 4 - Severe disability; unable to walk without  assistance and unable to attend to own bodily needs without assistance    PLAN :  Planned Interventions: Adaptive equipment, ADI training, activity tolerance, functional balance training, functional mobility training, therapeutic exercise, therapeutic activity, patient/caregiver education and training, neuromuscular re-education, energy conservation, and cognitive training.  Frequency/Duration: Patient to be seen 1-5x/week x 4 weeks.  Discharge Recommendations: skilled nursing facility (SNF)  Further Equipment Recommendations for Discharge: DME needs to be determined at time of d/c from rehab     Patient and/or family have participated as able in goal setting and plan of care.    PRIOR LEVEL OF FUNCTION:     Information was obtained by:  patient, chart review  Patient lives at [facility]. States she receives assistance with ADL's as needed but was able to dress herself independently. Assistance provided for all IADL's. Per PT eval, patient has been w/c bound but was recently working with PT at ALF to begin ambulating with use of rollator.   Home equipment: grab bars, rollator, wheelchair    EDUCATION:     Barriers to learning/limitations: Yes;  difficulty processing new information, decreased cognition  Education provided to: patient on (+) role of OT, (+) OT plan of care, (+) Instructed patient in the benefits of maintaining activity tolerance, functional mobility, and independence with self care tasks during acute stay  to ensure safe return home and to baseline. Encouraged patient to increase frequency and duration OOB, be out of bed for  all meals, perform daily ADLs (as approved by RN/MD regarding bathing etc), and performing functional mobility to/from bathroom with staff assistance as needed., (+) instructed patient on the importance of activity while hospitalized to prevent a decline in function, (+) the importance of maintaining UE muscle strength and activity tolerance while hospitalized to prevent a  decline in function, (+) staff assistance with mobility, (+) ADL training, (+) safety  Educational handouts issued: none this session  Patient / family response to education: showing interest, trying to perform skills  Team Communication: Discussed with Krystina, RN     SUBJECTIVE:   Patient Sorry I didn't do much!.  --------------------------------------------------------------------------------------------------  Pain Assessment: Not rated; moderate  Pain Location:  R flank at site of large hematoma    OBJECTIVE DATA SUMMARY:   Present illness history:   Patient Active Problem List    Diagnosis Date Noted    COVID-19 10/21/2020    Stroke (HCC) 02/06/2021    CVA (cerebral vascular accident) (HCC) 02/06/2021    Atrial fibrillation (HCC) 10/31/2020    Other partial intestinal obstruction (HCC) 10/31/2020    Metabolic encephalopathy 10/21/2020    Contusion of left hip 10/21/2020    Left elbow contusion 10/21/2020    Multiple falls 10/21/2020    Subdural hematoma caused by concussion 08/27/2017    Subdural hematoma 08/27/2017    Deep venous thrombosis (HCC)       Previous medical history:   Past Medical History:   Diagnosis Date    Arthritis     Deep venous thrombosis (HCC)     Endocrine disease     hypothyroid    Hemochromatosis     Hypertension        Patient found Bed, (+) bed/chair exit alarm, (+) telemetry, (+) pure wick urine system    Cognition     Mental Status: Oriented to, person, place, situation, alert, awake, and pleasant.  Communication: grossly intact and slurred speech.  Attention span: fair(15-5min).  Follows commands: 1 step.  General Cognition: slow processing, delayed responses, impaired problem solving, and impaired ST Memory.  Hearing: grossly intact.  Vision:  grossly intact    Activities of Daily Living      Eating: min. assist, set-up, safety concerns, and bed level.  Grooming: standby assist, set-up, increased time, bed level, EOB level, and washing face .  UB Bathing: mod. assist.  LB  Bathing: max. assist.  UB Dressing: mod. assist.  LB Dressing: max. assist  Toileting: max. assist.    Comment:  Based on direct observation and clinical assessment. Patient states she is limited in LB ADL's at baseline, requiring assistance for socks normally. Edu provided on hemi-dressing techniques during doffing/donning of gown at bed level (with bed in chair position). Patient verbalizes understanding of technique but limited carry though.     Mobility    Rolling: mod. assist.  Supine to sit: max. assist and 2 person.  Sit to Supine: max. assist and 2 person.  Sit to Stand: max. assist and 2 person.    Transfers:  Unable to tolerate    Comment: Patient with n/v after first sit <> stand attempt, deferred further mobility  - significant retropulsion with L sided lean while seated EOB and in standing. Verbal/tactile cues required for correction. L sided lean d/t leaning away from painful R flank.     Functional Balance    Static Sitting Balance: poor.  Dynamic Sitting Balance: poor.  Static Standing Balance: poor-.  Dynamic Standing Balance: poor-.  Activity  Tolerance: fair+.    Comment: limited by pain and n/v    Upper Extremity Function    Right ROM/strength:  AROM, WNL, and 4+/5.  Left ROM/strength: AROM, WFL, and 3+/5  Right UE: intact sensation and intact coordination.  Left UE : decreased tone and impaired coordination.  Dominance: right  Affected extremity: left    Final Location: bed, bed alarm, all needs close, agrees to call for assistance, and nurse notified .       Micky Index of Independence in Activities of Daily Living    Activities  Points (1 or 0) Independence  (1 point)  NO supervision, direction, or personal assistance Dependence   (0 points)  WITH supervision, direction, personal assistance, or total care   Bathing []  (1 POINT) Bathes self completely or needs help in bathing only a single part of the body such as the back, genital area, or disabled extremity [x]  (0 POINTS) Need help with bathing  more than one part of the body, getting in or out of the tub or showering. Requires total bathing.    Dressing []  (1 POINT) Get clothes from  closets and drawers and puts on clothes and outer garments complete with fasteners. May have help tying shoes.  [x]  (0 POINTS) Needs help with dressing self or needs to be completely dressed.    Toileting []  (1 POINT) Goes to toileting, gets on and off, arranges clothes, cleans genital area without help  [x]  Needs help transferring to the toileting, cleaning self or uses bedpan or commode    Transferring []  (1 POINT) Moves in and out of the bed or chair unassisted. Mechanical transfer aids are acceptable  [x] (0 POINTS) Needs help in moving from bed to chair or requires a complete transfer   Continence  []  (1 POINT) Exercises complete self control over urination and defecation  [x]  (0 POINTS) Is partially or totally incontinent of bowel or bladder   Feeding  []  (1 POINT) Gets food from plate into mouth without help. Preparation of food may be done by another person [x]  (0 POINTS) Needs partial or total help with feeding or requires parenteral feeding.     Total Score = 0             Scoring: 6 = High (patient independent) 0 = Low (patient very dependent)      By Hoy Shutter, PhD, APRN, BC, [facility], and Ronal Corns, PhD, ARNP, [place]  [facility]      Occupational Therapy Goals:   OT goals initiated 02/10/2021 and will be met by patient within 10 sessions     - Patient will perform Upper Body Dressing with minimum assistance and DME/AE PRN.   - Patient will perform all aspects of toileting with moderate assistance and DME/AE PRN.  - Patient will perform Upper Body Bathing with minimum assistance and DME/AE PRN.  - Patient will perform sit to stand in preparation for toileting with moderate assistance and DME PRN.  -  Patient will be Oriented x3 to promote maximal independence in basic ADLs.   - Patient will consistently follow  multi-step commands (2 or more) to improve performance of ADL's with independence.   _______________________________________________________________________    Thank you for this referral.    Nat Desanctis, OTD, OTR/L

## 2021-02-10 NOTE — Progress Notes (Signed)
Progress Note             Daily Progress Note: 02/10/2021    Assessment/Plan:        Events of weekend noted.   Left leg, arm weakness and left-sided neglect much improved compared to admission.    Notes reviewed.     Unfortunately, Hgb has dropped to 7.0, likely sec large hematoma right lateral abdomen, flank.   Had some right groin hematoma after emergent neuro intervention on admission.     Per neuro notes, patient had some hallucinations yesterday which have been intermittent for approximately the last year, not always at times with elevated ammonia levels.     Denies SOB, chest pain, abd pain.   Had BM yesterday.     Afebrile.   BP stable off PTA meds.     Hematoma right abd/flank warm to tough.         S/p acute R MCA CVA, likely sec A Fib.   Had been off anticoagulation due to high fall risk, multiple falls, multifactorial.       -s/p emergent thrombectomy.   Much improved neuro status compared to admission.  2.   Acute anemia, probably from hematoma.   Hemoccult stool      STAT CBC.   Transfuse if hg< 7.0.   Hold Eliquis in short term.   CT abdomen pelvis to assess extent of hematoma.   No abx for now, closely monitor.  3.   Anticoagulation.   Ongoing "rock and a hard place" scenario.   If hematoma stabilizes, anemia resolves, consider re-adding Eliquis, potentially lower dose.   If ultimately on chronic anticoagulation, she will need to be in long term skilled care and can't walk without someone with her.   May need to consult Dr. Arnette Felts for recs.  4.   Hypothyroidism.   Resume replacement.  5.   RA.   Resume Plaquenil.   Change Zofran to Phenergan sec potential drug interaction.  6.   Cirrhosis of liver prob sec hemochromatosis (+/- Methotrexate).     7.   Hepatic encephalopathy with hallucinations.   Superimposed Lewy Body dementia possible.  8.   Chr AF, CVR.  9.   Chr diastolic CHF, EF 10% (10/31/20).       Subjective:      Review of Systems:  ROS    Objective:   Physical Exam: Physical  Exam  Cardiovascular:      Rate and Rhythm: Normal rate. Rhythm irregular.   Pulmonary:      Breath sounds: Normal breath sounds.   Abdominal:      General: There is no distension.      Palpations: Abdomen is soft.      Comments: Hernia not distended.   Musculoskeletal:      Right lower leg: No edema.      Left lower leg: No edema.   Skin:     Comments: Large right flank/abdomen hematoma.   Neurological:      Mental Status: She is alert.       Visit Vitals  BP 113/71 (BP 1 Location: Right upper arm, BP Patient Position: Sitting)   Pulse 100   Temp 97.7 ??F (36.5 ??C)   Resp 20   Ht 5\' 3"  (1.6 m)   Wt 71 kg (156 lb 8.4 oz)   SpO2 100%   BMI 27.73 kg/m??      O2 Device: None (Room air)    Temp (24hrs), Avg:98.1 ??F (36.7 ??C), Min:97.7 ??  F (36.5 ??C), Max:98.4 ??F (36.9 ??C)    No intake/output data recorded.   11/26 1901 - 11/28 0700  In: -   Out: 450 [Urine:450]      Data Review:       24 Hour Results:  Recent Results (from the past 24 hour(s))   GLUCOSE, POC    Collection Time: 02/10/21 12:16 AM   Result Value Ref Range    Glucose (POC) 151 (H) 65 - 105 mg/dL   CBC WITH AUTOMATED DIFF    Collection Time: 02/10/21  5:56 AM   Result Value Ref Range    WBC 10.4 4.0 - 11.0 1000/mm3    RBC 2.26 (L) 3.60 - 5.20 M/uL    HGB 7.0 (L) 13.0 - 17.2 gm/dl    HCT 25.0 (L) 53.9 - 50.0 %    MCV 96.9 80.0 - 98.0 fL    MCH 31.0 25.4 - 34.6 pg    MCHC 32.0 30.0 - 36.0 gm/dl    PLATELET 767 341 - 937 1000/mm3    MPV 10.7 (H) 6.0 - 10.0 fL    RDW-SD 47.8 (H) 36.4 - 46.3      NRBC 0 0 - 0      IMMATURE GRANULOCYTES 0.6 0.0 - 3.0 %    NEUTROPHILS 74.2 (H) 34 - 64 %    LYMPHOCYTES 14.2 (L) 28 - 48 %    MONOCYTES 9.6 1 - 13 %    EOSINOPHILS 0.8 0 - 5 %    BASOPHILS 0.6 0 - 3 %   METABOLIC PANEL, BASIC    Collection Time: 02/10/21  5:56 AM   Result Value Ref Range    Potassium 4.5 3.5 - 5.1 mEq/L    Chloride 112 (H) 98 - 107 mEq/L    Sodium 142 136 - 145 mEq/L    CO2 24 20 - 31 mEq/L    Glucose 145 (H) 74 - 106 mg/dl    BUN 14 9 - 23 mg/dl     Creatinine 9.02 4.09 - 1.02 mg/dl    GFR est AA >73.5      GFR est non-AA >60      Calcium 8.4 (L) 8.7 - 10.4 mg/dl    Anion gap 6 5 - 15 mmol/L   MAGNESIUM    Collection Time: 02/10/21  5:56 AM   Result Value Ref Range    Magnesium 1.6 1.6 - 2.6 mg/dL   PHOSPHORUS    Collection Time: 02/10/21  5:56 AM   Result Value Ref Range    Phosphorus 3.5 2.4 - 5.1 mg/dL       Problem List:  Problem List as of 02/10/2021 Date Reviewed: Feb 07, 2021            Codes Class Noted - Resolved    COVID-19 ICD-10-CM: U07.1  ICD-9-CM: 079.89  10/21/2020 - Present    Overview Signed 11/01/2020  1:27 PM by Einar Gip, MD     10/21/20 SARS CoV-2 detected             Stroke [facility]) ICD-10-CM: I63.9  ICD-9-CM: 434.91  02-07-2021 - Present        CVA (cerebral vascular accident) [facility]) ICD-10-CM: I63.9  ICD-9-CM: 434.91  02/07/21 - Present        Atrial fibrillation (HCC) ICD-10-CM: I48.91  ICD-9-CM: 427.31  10/31/2020 - Present    Overview Addendum 11/06/2020 11:15 AM by Einar Gip, MD     Noted this AM (10/31/20)  Rate controlled (84  BPM)  No previously documented hx of AFIB  CHADS2VASc = 6  (08/27/17 CT Acute interhemispheric fissure subdural hemorrhage. Chronic small vessel ischemic change)  ECHO:  EF 75%< LV 25/44, S/PW 9/10, LAVI 28, Mild MR, Mod TR, RVSP 43             Other partial intestinal obstruction (HCC) ICD-10-CM: K56.690  ICD-9-CM: 560.89  10/31/2020 - Present    Overview Addendum 11/01/2020  1:28 PM by Einar Gip, MD     10/30/20 CT Small bowel obstruction secondary to presumed small bowel containing  umbilical hernia. Stranding within the hernia sac suggesting engorgement. No  pneumatosis.  11/01/20 SURG We will continue to follow the patient to ensure she does not develop worsening incarceration and bowel obstruction from the hernia.  Currently if we can delay the surgery she would likely benefit from decreased risk of postoperative complications             Metabolic encephalopathy ICD-10-CM: G93.41  ICD-9-CM: 348.31   10/21/2020 - Present        Contusion of left hip ICD-10-CM: K80.03KJ  ICD-9-CM: 924.01  10/21/2020 - Present        Left elbow contusion ICD-10-CM: S50.02XA  ICD-9-CM: 923.11  10/21/2020 - Present        Multiple falls ICD-10-CM: R29.6  ICD-9-CM: V15.88  10/21/2020 - Present        Deep venous thrombosis (HCC) ICD-10-CM: I82.409  ICD-9-CM: 453.40  Unknown - Present        Subdural hematoma caused by concussion ICD-10-CM: S06.5XAA  ICD-9-CM: 852.29  08/27/2017 - Present        Subdural hematoma ICD-10-CM: S06.5XAA  ICD-9-CM: 432.1  08/27/2017 - Present           Medications reviewed  Current Facility-Administered Medications   Medication Dose Route Frequency    promethazine (PHENERGAN) 12.5 mg in NS 50 mL IVPB  12.5 mg IntraVENous Q6H PRN    atorvastatin (LIPITOR) tablet 40 mg  40 mg Oral QHS    acetaminophen (TYLENOL) tablet 1,000 mg  1,000 mg Oral Q6H PRN    saline peripheral flush soln 10-20 mL  10-20 mL IntraVENous PRN    [Held by provider] apixaban (ELIQUIS) tablet 5 mg  5 mg Oral BID    albuterol-ipratropium (DUO-NEB) 2.5 MG-0.5 MG/3 ML  3 mL Nebulization Q4H PRN    [Held by provider] furosemide (LASIX) tablet 20 mg  20 mg Oral DAILY    hydrOXYchloroQUINE (PLAQUENIL) tablet 200 mg  200 mg Oral BID    [Held by provider] lactulose (CHRONULAC) 10 gram/15 mL solution 30 mL  20 g Oral BID    levothyroxine (SYNTHROID) tablet 125 mcg  125 mcg Oral ACB    [Held by provider] lisinopriL (PRINIVIL, ZESTRIL) tablet 10 mg  10 mg Oral DAILY    [Held by provider] metoprolol tartrate (LOPRESSOR) tablet 25 mg  25 mg Oral Q8H    [Held by provider] potassium chloride (K-DUR, KLOR-CON M20) SR tablet 40 mEq  40 mEq Oral DAILY    rifAXIMin (XIFAXAN) tablet 550 mg  550 mg Oral BID       Care Plan discussed with: Patient/Family and Nurse    Total time spent with patient: 40 minutes.    [name], MD  February 10, 2021

## 2021-02-11 ENCOUNTER — Inpatient Hospital Stay: Payer: MEDICARE | Primary: Internal Medicine

## 2021-02-11 ENCOUNTER — Inpatient Hospital Stay: Admit: 2021-02-11 | Payer: MEDICARE | Primary: Internal Medicine

## 2021-02-11 LAB — CBC WITH AUTOMATED DIFF
BASOPHILS: 0.4 % (ref 0–3)
BASOPHILS: 0.5 % (ref 0–3)
EOSINOPHILS: 2.3 % (ref 0–5)
EOSINOPHILS: 2.5 % (ref 0–5)
HCT: 21.3 % — ABNORMAL LOW (ref 37.0–50.0)
HCT: 25.4 % — ABNORMAL LOW (ref 37.0–50.0)
HGB: 6.9 gm/dl — CL (ref 13.0–17.2)
HGB: 8.5 gm/dl — ABNORMAL LOW (ref 13.0–17.2)
IMMATURE GRANULOCYTES: 0.9 % (ref 0.0–3.0)
IMMATURE GRANULOCYTES: 1 % (ref 0.0–3.0)
LYMPHOCYTES: 24.5 % — ABNORMAL LOW (ref 28–48)
LYMPHOCYTES: 18.6 % — ABNORMAL LOW (ref 28–48)
MCH: 30.8 pg (ref 25.4–34.6)
MCH: 30.9 pg (ref 25.4–34.6)
MCHC: 32.4 gm/dl (ref 30.0–36.0)
MCHC: 33.5 gm/dl (ref 30.0–36.0)
MCV: 95.1 fL (ref 80.0–98.0)
MCV: 92.4 fL (ref 80.0–98.0)
MONOCYTES: 15.3 % — ABNORMAL HIGH (ref 1–13)
MONOCYTES: 13.5 % — ABNORMAL HIGH (ref 1–13)
MPV: 10.6 fL — ABNORMAL HIGH (ref 6.0–10.0)
MPV: 10.6 fL — ABNORMAL HIGH (ref 6.0–10.0)
NEUTROPHILS: 56.6 % (ref 34–64)
NEUTROPHILS: 63.9 % (ref 34–64)
NRBC: 0 (ref 0–0)
NRBC: 0 (ref 0–0)
PLATELET: 167 10*3/uL (ref 140–450)
PLATELET: 164 10*3/uL (ref 140–450)
RBC: 2.24 M/uL — ABNORMAL LOW (ref 3.60–5.20)
RBC: 2.75 M/uL — ABNORMAL LOW (ref 3.60–5.20)
RDW-SD: 46.9 — ABNORMAL HIGH (ref 36.4–46.3)
RDW-SD: 51.6 — ABNORMAL HIGH (ref 36.4–46.3)
WBC: 9.5 10*3/uL (ref 4.0–11.0)
WBC: 9.3 10*3/uL (ref 4.0–11.0)

## 2021-02-11 LAB — GLUCOSE, POC
Glucose (POC): 186 mg/dL — ABNORMAL HIGH (ref 65–105)
Glucose (POC): 320 mg/dL — ABNORMAL HIGH (ref 65–105)

## 2021-02-11 LAB — TYPE + CROSSMATCH
Blood type code: 5100
Blood type code: 5100
Issue date/time: 202211282106
Specimen expiration date: 202212012359
Specimen expiration date: 202212012359
Status info.: TRANSFUSED

## 2021-02-11 LAB — METABOLIC PANEL, BASIC
Anion gap: 7 mmol/L (ref 5–15)
BUN: 12 mg/dl (ref 9–23)
CO2: 24 mEq/L (ref 20–31)
Calcium: 8.1 mg/dl — ABNORMAL LOW (ref 8.7–10.4)
Chloride: 110 mEq/L — ABNORMAL HIGH (ref 98–107)
Creatinine: 0.52 mg/dl — ABNORMAL LOW (ref 0.55–1.02)
GFR est AA: >60
GFR est non-AA: >60
Glucose: 119 mg/dl — ABNORMAL HIGH (ref 74–106)
Potassium: 4.3 mEq/L (ref 3.5–5.1)
Sodium: 141 mEq/L (ref 136–145)

## 2021-02-11 LAB — IRON PROFILE
Iron % saturation: 12 % — ABNORMAL LOW (ref 20–45)
Iron: 22 ug/dL — ABNORMAL LOW (ref 50–170)
TIBC: 181 ug/dL — ABNORMAL LOW (ref 250–425)

## 2021-02-11 LAB — BASIC METABOLIC PANEL
Anion Gap: 7 mmol/L (ref 5–15)
BUN: 12 mg/dl (ref 9–23)
CO2: 24 mEq/L (ref 20–31)
Calcium: 8.1 mg/dl — ABNORMAL LOW (ref 8.7–10.4)
Chloride: 110 mEq/L — ABNORMAL HIGH (ref 98–107)
Creatinine: 0.52 mg/dl — ABNORMAL LOW (ref 0.55–1.02)
EGFR IF NonAfrican American: >60
GFR African American: >60
Glucose: 119 mg/dl — ABNORMAL HIGH (ref 74–106)
Potassium: 4.3 mEq/L (ref 3.5–5.1)
Sodium: 141 mEq/L (ref 136–145)

## 2021-02-11 LAB — CBC WITH AUTO DIFFERENTIAL
Basophils %: 0.4 % (ref 0–3)
Basophils %: 0.5 % (ref 0–3)
Eosinophils %: 2.3 % (ref 0–5)
Eosinophils %: 2.5 % (ref 0–5)
Hematocrit: 21.3 % — ABNORMAL LOW (ref 37.0–50.0)
Hematocrit: 25.4 % — ABNORMAL LOW (ref 37.0–50.0)
Hemoglobin: 6.9 gm/dl — CL (ref 13.0–17.2)
Hemoglobin: 8.5 gm/dl — ABNORMAL LOW (ref 13.0–17.2)
Immature Granulocytes: 0.9 % (ref 0.0–3.0)
Immature Granulocytes: 1 % (ref 0.0–3.0)
Lymphocytes %: 24.5 % — ABNORMAL LOW (ref 28–48)
Lymphocytes %: 18.6 % — ABNORMAL LOW (ref 28–48)
MCH: 30.8 pg (ref 25.4–34.6)
MCH: 30.9 pg (ref 25.4–34.6)
MCHC: 32.4 gm/dl (ref 30.0–36.0)
MCHC: 33.5 gm/dl (ref 30.0–36.0)
MCV: 95.1 fL (ref 80.0–98.0)
MCV: 92.4 fL (ref 80.0–98.0)
MPV: 10.6 fL — ABNORMAL HIGH (ref 6.0–10.0)
MPV: 10.6 fL — ABNORMAL HIGH (ref 6.0–10.0)
Monocytes %: 15.3 % — ABNORMAL HIGH (ref 1–13)
Monocytes %: 13.5 % — ABNORMAL HIGH (ref 1–13)
Neutrophils %: 56.6 % (ref 34–64)
Neutrophils %: 63.9 % (ref 34–64)
Nucleated RBCs: 0 (ref 0–0)
Nucleated RBCs: 0 (ref 0–0)
Platelets: 167 10*3/uL (ref 140–450)
Platelets: 164 10*3/uL (ref 140–450)
RBC: 2.24 M/uL — ABNORMAL LOW (ref 3.60–5.20)
RBC: 2.75 M/uL — ABNORMAL LOW (ref 3.60–5.20)
RDW-SD: 46.9 — ABNORMAL HIGH (ref 36.4–46.3)
RDW-SD: 51.6 — ABNORMAL HIGH (ref 36.4–46.3)
WBC: 9.5 10*3/uL (ref 4.0–11.0)
WBC: 9.3 10*3/uL (ref 4.0–11.0)

## 2021-02-11 LAB — IRON AND TIBC
Iron Saturation: 12 % — ABNORMAL LOW (ref 20–45)
Iron: 22 ug/dL — ABNORMAL LOW (ref 50–170)
TIBC: 181 ug/dL — ABNORMAL LOW (ref 250–425)

## 2021-02-11 LAB — TYPE AND CROSSMATCH
BLOOD TYPE CODE: 5100
BLOOD TYPE CODE: 5100
ISSUE DATE/TIME: 202211282106
SPECIMEN EXPIRATION DATE: 202212012359
SPECIMEN EXPIRATION DATE: 202212012359
STATUS INFO.: TRANSFUSED

## 2021-02-11 LAB — POCT GLUCOSE
POC Glucose: 186 mg/dL — ABNORMAL HIGH (ref 65–105)
POC Glucose: 320 mg/dL — ABNORMAL HIGH (ref 65–105)

## 2021-02-11 MED ORDER — SODIUM CHLORIDE 0.9 % IV
INTRAVENOUS | Status: AC | PRN
Start: 2021-02-11 — End: 2021-02-19

## 2021-02-11 MED ORDER — IOPAMIDOL 76 % IV SOLN
370 mg iodine /mL (76 %) | Freq: Once | INTRAVENOUS | Status: AC
Start: 2021-02-11 — End: 2021-02-11
  Administered 2021-02-11: 16:00:00 via INTRAVENOUS

## 2021-02-11 MED FILL — SODIUM CHLORIDE 0.9 % IV: INTRAVENOUS | Qty: 250

## 2021-02-11 MED FILL — ATORVASTATIN 40 MG TAB: 40 mg | ORAL | Qty: 1

## 2021-02-11 MED FILL — ISOVUE-370  76 % INTRAVENOUS SOLUTION: 370 mg iodine /mL (76 %) | INTRAVENOUS | Qty: 100

## 2021-02-11 MED FILL — XIFAXAN 550 MG TABLET: 550 mg | ORAL | Qty: 1

## 2021-02-11 MED FILL — HYDROXYCHLOROQUINE 200 MG TAB: 200 mg | ORAL | Qty: 1

## 2021-02-11 MED FILL — LEVOTHYROXINE 125 MCG TAB: 125 mcg | ORAL | Qty: 1

## 2021-02-11 NOTE — Progress Notes (Signed)
Peripheral Vascular Lab Preliminary : Right Groin Duplex    1. No evidence of pseudoaneurysm   A hematoma was visualized in the proximal groin region above the iliac crest measuring 12 cm x 15 cm.   2. Multiphasic doppler waveforms identified in the common femoral, superficial femoral and deep femoral arteries.  3. No evidence of thrombus noted in the right proximal femoral and great saphenous veins.         Final report to follow   Kathrin Ruddy RVT

## 2021-02-11 NOTE — Progress Notes (Signed)
OCCUPATIONAL THERAPY TREATMENT     Patient: [name] [name] (87 y.o. female)  Room: 6628/6628    Primary Diagnosis: Stroke [facility]) [I63.9]  CVA (cerebral vascular accident) (HCC) [I63.9]  Atrial fibrillation (HCC) [I48.91]   Procedure(s) (LRB):  Procedure Cancelled/Not Performed - ESOPHAGOGASTRODUODENOSCOPY (EGD) (N/A) 4 Days Post-Op  Date of Admission: 02/06/2021   Length of Stay:  5 day(s)  Insurance: Payor: [facility] / Plan: [facility] MEDICARE A & B / Product Type: Medicare /      Date: 02/11/2021  In time:  11:53        Out time:  12:24    Isolation:  There are currently no Active Isolations       MDRO: No active infections    Precautions:  Poor Safety Awareness, and L sided weakness, SBP 100-140   Ordered weight bearing status: NA    Current diet order: ADULT DIET Regular; Mildly Thick (Nectar)    ASSESSMENT:    Based on the objective data described below, the patient presents with       -  patient making progress towards established goals.   -  patient tolerated session well with no complications.   -  patient motivated to participate in OT to return to prior level of function in ADLs, IADLs and functional mobility.    - Modified Rankin Score (mRS): 4 - Severe disability; unable to walk without assistance and unable to attend to own bodily needs without assistance        PLAN: Continue OT POC    Recommendations:  Recommend continued skilled occupational therapy intervention to address above impairments.  Recommend out of bed activity to counteract ill effects of bedrest, with assistance from staff as needed.    Discharge Recommendations: skilled nursing facility (SNF).  Equipment Recommendations for Discharge: DME needs to be determined at time of d/c from rehab     Education/ communication:     Barriers to learning/limitations:  Yes;  difficulty processing new information  Education provided to: patient on (+) role of OT, (+) OT plan of care, (+) instructed patient on the importance of activity while hospitalized  to prevent a decline in function, (+) the importance of maintaining UE muscle strength and activity tolerance while hospitalized to prevent a decline in function, (+) staff assistance with mobility, (+) change positions frequently, (+) functional mobility, (+) discharge disposition/recommendations, (+) ADL training  Educational handouts issued: none this session  Patient / family response to education: showing interest, trying to perform skills    SUBJECTIVE:     Patient agreed to OT session.    OBJECTIVE DATA SUMMARY:     Orders, labs, occupational therapy and chart reviewed on Laquida K Hosek. Communicated with nursing staff. Patient cleared to participate in Occupational Therapy treatment.    Patient found: Bed, (+) bed/chair exit alarm    Most recent value for oxygen in flow sheets:  O2 Device: None (Room air) (02/11/21 0525)        Pain assessment: none reported, none observed    Cognitive:   Mental status:   Orientation: Patient is oriented x 3  Communication: grossly intact  Attention Span:  fair (15-21min)  Follows commands: able to follow simple 1 step commands, delayed response  Safety/Judgement: needs cueing for safety and precautions    Activities of Daily Living:  -  Patient completed washing face/hand with stand-by assistance, bed level, set-up.  -  Patient performed UB bathing  with minimum assistance, bed level, cues for throughness,  cues for re-attention to task, set-up  - Patient performed applying deodorant to bilateral underarms with MIN A for container management, patient able to apply SBA.  - Patient performed UB dressing with MOD A,bed level.     Mobility:  - NT    Activity Tolerance:   - no apparent distress  - vitals stable  - BP 103/48 O2 99% HR 83    Balance:  - NT    Therapeutic Exercises:   none this session    Final Location: Patient positioned in bed, all needs within reach, agrees to call for assistance, (+) bed/chair exit alarm    Briscoe Deutscher, OTA  February 11, 2021

## 2021-02-11 NOTE — Progress Notes (Signed)
 SPEECH LANGUAGE PATHOLOGY   BEDSIDE DYSPHAGIA TREATMENT      Patient: [name] [name] (87 y.o. female) 15-Feb-1934  Room: 6628/6628  Primary Diagnosis: Stroke [facility]) [I63.9]  CVA (cerebral vascular accident) (HCC) [I63.9]  Atrial fibrillation (HCC) [I48.91]    Procedure(s) (LRB):  Procedure Cancelled/Not Performed - ESOPHAGOGASTRODUODENOSCOPY (EGD) (N/A) 4 Days Post-Op    Isolation:  There are currently no Active Isolations       MDRO: No current active infections  PPE: surgical mask, gloves  Precautions:  universal and aspiration     Date: 02/11/2021  Start Time:  8:45 End Time:  9:04    TREATMENT:    Orientation: Pt awake, alert, confused, able to follow contextual commands   Respiratory Status: RA    Treatment: Swallow tx completed b/s with tolerance/ safety of IDDSI 6- soft & bite sized, IDDSI 2 mildly thick (nectar), therapeutic trials of IDDSI 7- regular for possible diet advancement, and use of compensatory swallow strategies.  Pt seen sitting up in bed consuming am meal.  Mildly prolonged mastication with soft solids as well as regular solid trial with mild buccal stasis on L side, cleared independently with lingual sweep.  Pt required min verbal cues to decrease bite size and rate of intake.  Mildly thick liquids with no overt s/s aspiration or change in breath sounds.  Recommend advance to IDDSI 7-regular solids with mildly thick liquids.  Our service to follow up for diet tolerance and further training on use of compensatory swallow strategies 1-2x as appropriate.        Functional Oral Intake Scale: Level 7: Total oral intake without restrictions    PLAN/ RECOMMENDATIONS:                 Diet Recommendations: change solids to regular, continue mildly thick (nectar) liquids   Compensatory Swallow Strategies: HOB ~60* for all po feeds and >45* at least 30 minutes following, small bites/ sip, slow rate of eating/ feeding, alternate solids/ liquids   Medication Administration: as tolerated   Aspiration  Precautions: may feed self independently, check oral cavity for residue  Risk(s) for Aspiration: advanced age , medical condition, current level of dysphagia, history of dysphagia, history of aspiration, acute CVA, brain lesions, head injury     Therapy Recommendations: pt would benefit from continuation of SLP for dysphagia 1-2 visits to ensure tolerance/ safety of LRD    Therapy Prognosis: good    Discharge Recommendations: no follow up indicated  Other Recommended Services: OT, PT, Dietitian     SUBJECTIVE:    Patient Stated: When are we taking our trip?   Pain Prior to Treatment: no pain reported   Pain Following Treatment: no pain reported     OBJECTIVE:    Position: upright in bed  Consistencies: mildly thick (nectar) via cup, regular and soft/ bite sized (IDDSI 6)   Oral Phase: mild prolonged mastication and left buccal residue  Pharyngeal Phase: WFL  Exercises:  n/a    Safety: Following session, pt tray table, phone, and call bell within reach   Modified Rankin Score (MRS): Defer to OT/ PT    EDUCATION:   Education Provided: POC, diet recommendations, and compensatory strategies for swallowing  Individual Educated: patient  Comprehension: pt communicated Armed forces logistics/support/administrative officer Education: Educated Nurse to POC and diet recommendations.       Thank you,      Isaiah Lima MS CCC-SLP  Speech-Language Pathologist  [phone] or Secure Chat

## 2021-02-11 NOTE — Progress Notes (Signed)
Progress Note             Daily Progress Note: 02/11/2021    Assessment/Plan:        Pt mildly confused.   She denies chest pain, sob, lightheadedness, abdominal pain.   Discomfort from hematoma right abdomen less.   Just completed second unit of PRBCs.   Is going down for CT abd/pelvis.     Afebrile.   BP stable.   AF with vent rate mostly in 90s, occ low 100s.       Lungs clear.     Large hematoma right flank/abdomen, groin to lesser degree, slightly larger than yesterday.   (Potentially is from her fall vs related to vascular access)     Motor strength left arm/leg improved compared to admission, +4/5.    Hgb 6.9.   WBCs 9.5.         S/p acute R MCA CVA, likely sec A Fib.   Had been off anticoagulation due to high fall risk, multiple falls, multifactorial.       -s/p emergent thrombectomy.   Much improved neuro status compared to admission.  2.   Right hematoma with acute anemia, s/p transfusion x 2.   Last dose Eliquis yesterday am.   CT abd/pelvis pending.   Repeat CBC 11 this am.     Discussed briefly with Dr. Allison Quarry who is going to contact vascular surgery.     No abx for now, closely monitor.  3.   Anticoagulation.   Ongoing "rock and a hard place" scenario.   If hematoma stabilizes, anemia resolves, consider re-adding Eliquis, potentially lower dose.   If ultimately on chronic anticoagulation, she will need to be in long term skilled care and can't walk without someone with her.   May need to consult Dr. Arnette Felts for recs.  4.   Hypothyroidism.   Resume replacement.  5.   RA.   Resume Plaquenil.   Change Zofran to Phenergan sec potential drug interaction.  6.   Cirrhosis of liver prob sec hemochromatosis (+/- Methotrexate).   Ammonia nl.   DC Rifaxamin (started yesterday).  7.   Hepatic encephalopathy with hallucinations.   Superimposed Lewy Body dementia possible.  8.   Chr AF, CVR.   Continue to hold BB for now.  9.   Chr diastolic CHF, EF 16% (10/31/20).    Discussed with patient's son per telephone.        Subjective:      Review of Systems:  ROS    Objective:   Physical Exam: Physical Exam  Cardiovascular:      Rate and Rhythm: Normal rate. Rhythm irregular.   Pulmonary:      Breath sounds: Normal breath sounds.   Abdominal:      General: There is no distension.      Palpations: Abdomen is soft.      Comments: Hernia not distended.   Musculoskeletal:      Right lower leg: No edema.      Left lower leg: No edema.   Skin:     Comments: Large right flank/abdomen hematoma.   Neurological:      Mental Status: She is alert.       Visit Vitals  BP 119/60 (BP 1 Location: Left upper arm, BP Patient Position: Supine)   Pulse 95   Temp 97.9 ??F (36.6 ??C)   Resp 16   Ht 5\' 3"  (1.6 m)   Wt 71.7 kg (158 lb 1.1 oz)  SpO2 99%   BMI 28.00 kg/m??      O2 Device: None (Room air)    Temp (24hrs), Avg:98.2 ??F (36.8 ??C), Min:97.7 ??F (36.5 ??C), Max:99.1 ??F (37.3 ??C)    11/29 0701 - 11/29 1900  In: 360.4   Out: -    11/27 1901 - 11/29 0700  In: 337.7   Out: 950 [Urine:950]      Data Review:       24 Hour Results:  Recent Results (from the past 24 hour(s))   GLUCOSE, POC    Collection Time: 02/10/21 12:04 PM   Result Value Ref Range    Glucose (POC) 153 (H) 65 - 105 mg/dL   CBC WITH AUTOMATED DIFF    Collection Time: 02/10/21  3:58 PM   Result Value Ref Range    WBC 10.3 4.0 - 11.0 1000/mm3    RBC 2.06 (L) 3.60 - 5.20 M/uL    HGB 6.6 (LL) 13.0 - 17.2 gm/dl    HCT 78.2 (LL) 95.6 - 50.0 %    MCV 97.1 80.0 - 98.0 fL    MCH 32.0 25.4 - 34.6 pg    MCHC 33.0 30.0 - 36.0 gm/dl    PLATELET 213 086 - 578 1000/mm3    MPV 10.7 (H) 6.0 - 10.0 fL    RDW-SD 47.9 (H) 36.4 - 46.3      NRBC 0 0 - 0      IMMATURE GRANULOCYTES 0.8 0.0 - 3.0 %    NEUTROPHILS 63.9 34 - 64 %    LYMPHOCYTES 20.7 (L) 28 - 48 %    MONOCYTES 12.8 1 - 13 %    EOSINOPHILS 1.3 0 - 5 %    BASOPHILS 0.5 0 - 3 %   ANTIBODY SCREEN    Collection Time: 02/10/21  3:58 PM   Result Value Ref Range    Antibody screen NEG     AMMONIA    Collection Time: 02/10/21  3:58 PM   Result Value Ref  Range    Ammonia, plasma <17.0 11.2 - 31.7 umol/L   BLOOD TYPE, (ABO+RH)    Collection Time: 02/10/21  3:58 PM   Result Value Ref Range    ABO/Rh(D) O Rh Positive     FERRITIN    Collection Time: 02/10/21  3:58 PM   Result Value Ref Range    Ferritin 50.1 7.3 - 270.7 ng/ml   TYPE + CROSSMATCH    Collection Time: 02/10/21  5:00 PM   Result Value Ref Range    Product ID Red Blood Cells      Unit number [id number]      Crossmatch result Compatible      Status info. Issued      Product code [phone]      Blood type code 5100      Issue date/time [id number]      Specimen expiration date [id number]     TYPE + CROSSMATCH    Collection Time: 02/10/21  7:24 PM   Result Value Ref Range    Product ID Red Blood Cells      Unit number [id number]      Crossmatch result Compatible      Status info. Transfused      Product code [id number]      Blood type code 5100      Issue date/time 202211282106      Specimen expiration date [id number]     METABOLIC PANEL, BASIC    Collection Time: 02/11/21  4:20 AM   Result Value Ref Range    Potassium 4.3 3.5 - 5.1 mEq/L    Chloride 110 (H) 98 - 107 mEq/L    Sodium 141 136 - 145 mEq/L    CO2 24 20 - 31 mEq/L    Glucose 119 (H) 74 - 106 mg/dl    BUN 12 9 - 23 mg/dl    Creatinine 9.62 (L) 0.55 - 1.02 mg/dl    GFR est AA >83.6      GFR est non-AA >60      Calcium 8.1 (L) 8.7 - 10.4 mg/dl    Anion gap 7 5 - 15 mmol/L   CBC WITH AUTOMATED DIFF    Collection Time: 02/11/21  4:20 AM   Result Value Ref Range    WBC 9.5 4.0 - 11.0 1000/mm3    RBC 2.24 (L) 3.60 - 5.20 M/uL    HGB 6.9 (LL) 13.0 - 17.2 gm/dl    HCT 62.9 (L) 47.6 - 50.0 %    MCV 95.1 80.0 - 98.0 fL    MCH 30.8 25.4 - 34.6 pg    MCHC 32.4 30.0 - 36.0 gm/dl    PLATELET 546 503 - 546 1000/mm3    MPV 10.6 (H) 6.0 - 10.0 fL    RDW-SD 46.9 (H) 36.4 - 46.3      NRBC 0 0 - 0      IMMATURE GRANULOCYTES 0.9 0.0 - 3.0 %    NEUTROPHILS 56.6 34 - 64 %    LYMPHOCYTES 24.5 (L) 28 - 48 %    MONOCYTES 15.3 (H) 1 - 13 %    EOSINOPHILS 2.3 0 - 5 %     BASOPHILS 0.4 0 - 3 %   TYPE + CROSSMATCH    Collection Time: 02/11/21  8:15 AM   Result Value Ref Range    Product ID Red Blood Cells      Unit number [id number]      Crossmatch result Compatible      Status info. Ready      Product code [phone]      Blood type code 5100      Specimen expiration date [id number]         Problem List:  Problem List as of 02/11/2021 Date Reviewed: 03-07-2021            Codes Class Noted - Resolved    COVID-19 ICD-10-CM: U07.1  ICD-9-CM: 079.89  10/21/2020 - Present    Overview Signed 11/01/2020  1:27 PM by Einar Gip, MD     10/21/20 SARS CoV-2 detected             Stroke [facility]) ICD-10-CM: I63.9  ICD-9-CM: 434.91  2021-03-07 - Present        CVA (cerebral vascular accident) [facility]) ICD-10-CM: I63.9  ICD-9-CM: 434.91  03/07/2021 - Present        Atrial fibrillation (HCC) ICD-10-CM: I48.91  ICD-9-CM: 427.31  10/31/2020 - Present    Overview Addendum 11/06/2020 11:15 AM by Einar Gip, MD     Noted this AM (10/31/20)  Rate controlled (84 BPM)  No previously documented hx of AFIB  CHADS2VASc = 6  (08/27/17 CT Acute interhemispheric fissure subdural hemorrhage. Chronic small vessel ischemic change)  ECHO:  EF 75%< LV 25/44, S/PW 9/10, LAVI 28, Mild MR, Mod TR, RVSP 43             Other partial intestinal obstruction (HCC) ICD-10-CM: K56.690  ICD-9-CM: 560.89  10/31/2020 -  Present    Overview Addendum 11/01/2020  1:28 PM by Einar Gip, MD     10/30/20 CT Small bowel obstruction secondary to presumed small bowel containing  umbilical hernia. Stranding within the hernia sac suggesting engorgement. No  pneumatosis.  11/01/20 SURG We will continue to follow the patient to ensure she does not develop worsening incarceration and bowel obstruction from the hernia.  Currently if we can delay the surgery she would likely benefit from decreased risk of postoperative complications             Metabolic encephalopathy ICD-10-CM: G93.41  ICD-9-CM: 348.31  10/21/2020 - Present        Contusion of  left hip ICD-10-CM: X52.84XL  ICD-9-CM: 924.01  10/21/2020 - Present        Left elbow contusion ICD-10-CM: S50.02XA  ICD-9-CM: 923.11  10/21/2020 - Present        Multiple falls ICD-10-CM: R29.6  ICD-9-CM: V15.88  10/21/2020 - Present        Deep venous thrombosis (HCC) ICD-10-CM: I82.409  ICD-9-CM: 453.40  Unknown - Present        Subdural hematoma caused by concussion ICD-10-CM: S06.5XAA  ICD-9-CM: 852.29  08/27/2017 - Present        Subdural hematoma ICD-10-CM: S06.5XAA  ICD-9-CM: 432.1  08/27/2017 - Present         Medications reviewed  Current Facility-Administered Medications   Medication Dose Route Frequency    0.9% sodium chloride infusion 250 mL  250 mL IntraVENous PRN    promethazine (PHENERGAN) 12.5 mg in NS 50 mL IVPB  12.5 mg IntraVENous Q6H PRN    0.9% sodium chloride infusion 250 mL  250 mL IntraVENous PRN    atorvastatin (LIPITOR) tablet 40 mg  40 mg Oral QHS    acetaminophen (TYLENOL) tablet 1,000 mg  1,000 mg Oral Q6H PRN    saline peripheral flush soln 10-20 mL  10-20 mL IntraVENous PRN    [Held by provider] apixaban (ELIQUIS) tablet 5 mg  5 mg Oral BID    albuterol-ipratropium (DUO-NEB) 2.5 MG-0.5 MG/3 ML  3 mL Nebulization Q4H PRN    [Held by provider] furosemide (LASIX) tablet 20 mg  20 mg Oral DAILY    hydrOXYchloroQUINE (PLAQUENIL) tablet 200 mg  200 mg Oral BID    [Held by provider] lactulose (CHRONULAC) 10 gram/15 mL solution 30 mL  20 g Oral BID    levothyroxine (SYNTHROID) tablet 125 mcg  125 mcg Oral ACB    [Held by provider] lisinopriL (PRINIVIL, ZESTRIL) tablet 10 mg  10 mg Oral DAILY    [Held by provider] metoprolol tartrate (LOPRESSOR) tablet 25 mg  25 mg Oral Q8H    [Held by provider] potassium chloride (K-DUR, KLOR-CON M20) SR tablet 40 mEq  40 mEq Oral DAILY       Care Plan discussed with: Patient/Family and Nurse    Total time spent with patient: 40 minutes.    [name], MD  February 11, 2021

## 2021-02-11 NOTE — Consults (Signed)
Vascular Surgery  Consultation    Admit date: 02/06/2021  Consult date: 02/11/2021  Referring provider: [name], MD     CC:   Chief Complaint   Patient presents with    Stroke       Assessment/Plan   [name] [name] is an 87 y.o. female with acute MCA CVA s/p emergent thrombectomy on 11/24 with R groin access, large R flank/groin/mons hematoma and ABLA on Chronic anemia; PMHx of Afib, off of AC due to fall risk/now hematoma, multiple falls, hepatic encephalopathy, Lewy body dementia, Cirrhosis 2/2 hemochromatosis    ABLA on chronic anemia, Hgb 6.9, s/p 2 U PRBCs  Repeat CBC pending  R flank, R groin and mons with ecchymosis, large abdominal wall hematoma, measuring 12 x 17 x 24 cm  VSS  Pain with movement  2+ R pedal pulses, motor to foot and sensation at baseline  Continue bed rest  May consider duplex to assess for active extravasation    Following closely    Thank you for allowing [place] to participate in the care of this patient.    Lisette Abu, PA-C    I have independently seen and examined the patient. I have reviewed all pertinent labs and imaging. I independently created a management plan with discussion with the provider below and I agree with the note.     87yoF admitted w/ stroke and R. M1 occlusion s/p R. CFA access, mechanical thrombectomy on 11/24 who presents w/ drop in hgb and CTA findings consistent with R. Extraperitoneal hematoma. I have did not see active extravasation on the CTA significant artifact from her b/l THA necessitated another modality- duplex obtained which showed no psa or sign of active extravasation. Her hgb stabilized with the most recent transfusion. No plan for emergent operation, continue to monitor. Discussed with neurosurgery team.       Mellody Memos, MD  Vascular and Endovascular Surgery  [facility]  Office: [phone]        HPI:  [name] [name] is an 87 y.o. female with recent intervention for stroke. Patient with subsequent fall in  hemoglobin and large flank hematoma recognized. Patient denies feeling weak or dizzy. Patient denies pain in her abdomen or leg at rest. Patient notes pain in her abdomen when "someone moves her".       Past Medical History:   Diagnosis Date    Arthritis     Deep venous thrombosis (HCC)     Endocrine disease     hypothyroid    Hemochromatosis     Hypertension        Past Surgical History:   Procedure Laterality Date    HX ORTHOPAEDIC      left knee replacement        Allergies   Allergen Reactions    Shellfish Derived Hives    Sulfa (Sulfonamide Antibiotics) Hives       History reviewed. No pertinent family history.    Social History     Socioeconomic History    Marital status: WIDOWED     Spouse name: Not on file    Number of children: Not on file    Years of education: Not on file    Highest education level: Not on file   Occupational History    Not on file   Tobacco Use    Smoking status: Never    Smokeless tobacco: Never   Vaping Use    Vaping Use: Never used  Substance and Sexual Activity    Alcohol use: No    Drug use: No    Sexual activity: Not on file   Other Topics Concern    Not on file   Social History Narrative    Not on file     Social Determinants of Health     Financial Resource Strain: Not on file   Food Insecurity: Not on file   Transportation Needs: Not on file   Physical Activity: Not on file   Stress: Not on file   Social Connections: Not on file   Intimate Partner Violence: Not on file   Housing Stability: Not on file       Body mass index is 28 kg/m??.    Review of Systems  Positive Findings will be BOLDED, otherwise negative  Constitutional:  Chills, diaphoresis, fever, malaise/fatigue, weakness, weight loss   Eyes:  Vision changes  ENT/Mouth/Face:  Congestion, headaches, sore throat   Respiratory:  Cough, shortness of breath   Cardiovascular:  Chest pain, claudication, leg swelling, palpitations   Gastrointestinal:  Abdominal pain, blood in stool, nausea, vomiting   Genitourinary:  Dysuria,  flank pain, frequency, blood in urine  Integumentary:  Rashes  Hematologic: Easy bruising/bleeding  Musculoskeletal: Back pain, muscle pain  Neurological: Dizziness, focal weakness, seizures, sensory changes  Psych: Depression, memory loss, nervous/anxious, substance abuse    Objective   Physical Exam  Visit Vitals  BP (!) 107/55 (BP 1 Location: Left upper arm, BP Patient Position: Supine)   Pulse 86   Temp 97.9 ??F (36.6 ??C)   Resp 17   Ht 5\' 3"  (1.6 m)   Wt 71.7 kg (158 lb 1.1 oz)   SpO2 97%   BMI 28.00 kg/m??     Constitutional:    Patient is well developed, nourished, and not distressed.   HEENT:   Eyes: conjunctiva pale, sclera clear  ENT: edentulous  Oral mucosa: pink   Neck: no JVD present, trachea midline, no carotid bruit  Cardiovascular:    Ascultation: RRR  Palpitation: no thrill  Pulmonary/Chest:   Effort: normal breathing pattern  Auscultation: CTA/B  Abdominal:    Auscultation: bowel sounds are normal  Palpation: patient exhibits TTP over R flank, large right flank hematoma with ecchymosis, no pulsatile midline mass, non distended, no organomegaly  Extremities:   ROM: normal  Digits: no cyanosis or clubbing  Edema: no edema  Neurological:   Oriented: alert and oriented to self and situation, affect and mood normal  Gait: not tested  Motor & sensory: grossly intact in all 4 limbs  Skin:    Large area of ecchymosis extending from right groin, across mons pubis, extending up patient's right flank  Pulses:       Right:      Carotid       nml pulse    Femoral      2+    Dorsalis      2+    Post Tib      2+ Left:        Carotid       nml pulse    Dorsalis      2+    Post Tib      2+        Labs   CBC w/Diff   Lab Results   Component Value Date/Time    WBC 9.5 02/11/2021 04:20 AM    RBC 2.24 (L) 02/11/2021 04:20 AM    HCT 21.3 (L) 02/11/2021  04:20 AM    MCV 95.1 02/11/2021 04:20 AM    MCH 30.8 02/11/2021 04:20 AM    MCHC 32.4 02/11/2021 04:20 AM    RDW 13.4 05/04/2012 09:52 AM        Basic Metabolic Profile    Lab Results   Component Value Date    NA 141 02/11/2021    CO2 24 02/11/2021    BUN 12 02/11/2021        Coagulation   Lab Results   Component Value Date    INR 1.2 (H) 02/06/2021    APTT 31.5 08/27/2017        PVL/Radiology reviewed  IMPRESSION:     1.  Large right abdominal wall hematoma measuring 12 x 17 x 24 cm. Mild mass effect on surrounding structures.  2.  Small bilateral pleural effusions, right greater than left  3.  Chronic lobulated lesion anterior to the lower sacrum, nonspecific. This  could represent extraosseous hematopoiesis. Other etiologies cannot be excluded at this time. Recommend nonemergent dedicated imaging such as MRI sacrum/coccyx, to include in and out of phase sequences.  4.  Small umbilical hernia containing nonobstructive loop of small bowel    Data: I independently reviewed the images of the CTA    Lisette Abu, PA-C  [facility]  Office [phone]    02/11/2021 11:59 AM

## 2021-02-11 NOTE — Progress Notes (Signed)
Problem: Aspiration - Risk of  Goal: *Absence of aspiration  Outcome: Progressing Towards Goal     Problem: Pressure Injury - Risk of  Goal: *Prevention of pressure injury  Description: Document Braden Scale and appropriate interventions in the flowsheet.  Outcome: Progressing Towards Goal  Note: Pressure Injury Interventions:  Sensory Interventions: Assess changes in LOC    Moisture Interventions: Check for incontinence Q2 hours and as needed, Absorbent underpads    Activity Interventions: Pressure redistribution bed/mattress(bed type)    Mobility Interventions: Pressure redistribution bed/mattress (bed type)    Nutrition Interventions: Document food/fluid/supplement intake    Friction and Shear Interventions: HOB 30 degrees or less                Problem: Falls - Risk of  Goal: *Absence of Falls  Description: Document Schmid Fall Risk and appropriate interventions in the flowsheet.  Outcome: Progressing Towards Goal  Note: Fall Risk Interventions:  Mobility Interventions: Bed/chair exit alarm, Communicate number of staff needed for ambulation/transfer, Patient to call before getting OOB    Mentation Interventions: Bed/chair exit alarm, Door open when patient unattended, Reorient patient    Medication Interventions: Bed/chair exit alarm, Patient to call before getting OOB, Teach patient to arise slowly    Elimination Interventions: Call light in reach, Patient to call for help with toileting needs, Bed/chair exit alarm    History of Falls Interventions: Bed/chair exit alarm, Door open when patient unattended

## 2021-02-12 LAB — TYPE + CROSSMATCH
Blood type code: 5100
Issue date/time: 202211290548
Specimen expiration date: 202212012359
Status info.: TRANSFUSED

## 2021-02-12 LAB — METABOLIC PANEL, BASIC
Anion gap: 7 mmol/L (ref 5–15)
BUN: 10 mg/dl (ref 9–23)
CO2: 23 mEq/L (ref 20–31)
Calcium: 8.2 mg/dl — ABNORMAL LOW (ref 8.7–10.4)
Chloride: 108 mEq/L — ABNORMAL HIGH (ref 98–107)
Creatinine: 0.51 mg/dl — ABNORMAL LOW (ref 0.55–1.02)
GFR est AA: >60
GFR est non-AA: >60
Glucose: 121 mg/dl — ABNORMAL HIGH (ref 74–106)
Potassium: 4 mEq/L (ref 3.5–5.1)
Sodium: 138 mEq/L (ref 136–145)

## 2021-02-12 LAB — CBC WITH AUTOMATED DIFF
BASOPHILS: 0.2 % (ref 0–3)
EOSINOPHILS: 1.8 % (ref 0–5)
HCT: 23.9 % — ABNORMAL LOW (ref 37.0–50.0)
HGB: 7.7 gm/dl — ABNORMAL LOW (ref 13.0–17.2)
IMMATURE GRANULOCYTES: 0.6 % (ref 0.0–3.0)
LYMPHOCYTES: 25.5 % — ABNORMAL LOW (ref 28–48)
MCH: 30.2 pg (ref 25.4–34.6)
MCHC: 32.2 gm/dl (ref 30.0–36.0)
MCV: 93.7 fL (ref 80.0–98.0)
MONOCYTES: 13.7 % — ABNORMAL HIGH (ref 1–13)
MPV: 10.7 fL — ABNORMAL HIGH (ref 6.0–10.0)
NEUTROPHILS: 58.2 % (ref 34–64)
NRBC: 0 (ref 0–0)
PLATELET: 166 10*3/uL (ref 140–450)
RBC: 2.55 M/uL — ABNORMAL LOW (ref 3.60–5.20)
RDW-SD: 52.7 — ABNORMAL HIGH (ref 36.4–46.3)
WBC: 9.4 10*3/uL (ref 4.0–11.0)

## 2021-02-12 LAB — GLUCOSE, POC
Glucose (POC): 151 mg/dL — ABNORMAL HIGH (ref 65–105)
Glucose (POC): 140 mg/dL — ABNORMAL HIGH (ref 65–105)

## 2021-02-12 LAB — CBC WITH AUTO DIFFERENTIAL
Basophils %: 0.2 % (ref 0–3)
Eosinophils %: 1.8 % (ref 0–5)
Hematocrit: 23.9 % — ABNORMAL LOW (ref 37.0–50.0)
Hemoglobin: 7.7 gm/dl — ABNORMAL LOW (ref 13.0–17.2)
Immature Granulocytes: 0.6 % (ref 0.0–3.0)
Lymphocytes %: 25.5 % — ABNORMAL LOW (ref 28–48)
MCH: 30.2 pg (ref 25.4–34.6)
MCHC: 32.2 gm/dl (ref 30.0–36.0)
MCV: 93.7 fL (ref 80.0–98.0)
MPV: 10.7 fL — ABNORMAL HIGH (ref 6.0–10.0)
Monocytes %: 13.7 % — ABNORMAL HIGH (ref 1–13)
Neutrophils %: 58.2 % (ref 34–64)
Nucleated RBCs: 0 (ref 0–0)
Platelets: 166 10*3/uL (ref 140–450)
RBC: 2.55 M/uL — ABNORMAL LOW (ref 3.60–5.20)
RDW-SD: 52.7 — ABNORMAL HIGH (ref 36.4–46.3)
WBC: 9.4 10*3/uL (ref 4.0–11.0)

## 2021-02-12 LAB — BASIC METABOLIC PANEL
Anion Gap: 7 mmol/L (ref 5–15)
BUN: 10 mg/dl (ref 9–23)
CO2: 23 mEq/L (ref 20–31)
Calcium: 8.2 mg/dl — ABNORMAL LOW (ref 8.7–10.4)
Chloride: 108 mEq/L — ABNORMAL HIGH (ref 98–107)
Creatinine: 0.51 mg/dl — ABNORMAL LOW (ref 0.55–1.02)
EGFR IF NonAfrican American: >60
GFR African American: >60
Glucose: 121 mg/dl — ABNORMAL HIGH (ref 74–106)
Potassium: 4 mEq/L (ref 3.5–5.1)
Sodium: 138 mEq/L (ref 136–145)

## 2021-02-12 LAB — POCT GLUCOSE
POC Glucose: 151 mg/dL — ABNORMAL HIGH (ref 65–105)
POC Glucose: 140 mg/dL — ABNORMAL HIGH (ref 65–105)

## 2021-02-12 LAB — TYPE AND CROSSMATCH
BLOOD TYPE CODE: 5100
ISSUE DATE/TIME: 202211290548
SPECIMEN EXPIRATION DATE: 202212012359
STATUS INFO.: TRANSFUSED

## 2021-02-12 MED FILL — HYDROXYCHLOROQUINE 200 MG TAB: 200 mg | ORAL | Qty: 1

## 2021-02-12 MED FILL — ATORVASTATIN 40 MG TAB: 40 mg | ORAL | Qty: 1

## 2021-02-12 MED FILL — LEVOTHYROXINE 125 MCG TAB: 125 mcg | ORAL | Qty: 1

## 2021-02-12 MED FILL — ACETAMINOPHEN 500 MG TAB: 500 mg | ORAL | Qty: 2

## 2021-02-12 NOTE — Progress Notes (Signed)
Problem: Aspiration - Risk of  Goal: *Absence of aspiration  Outcome: Progressing Towards Goal     Problem: Pressure Injury - Risk of  Goal: *Prevention of pressure injury  Description: Document Braden Scale and appropriate interventions in the flowsheet.  Note: Pressure Injury Interventions:  Sensory Interventions: Assess changes in LOC    Moisture Interventions: Check for incontinence Q2 hours and as needed, Moisture barrier    Activity Interventions: Pressure redistribution bed/mattress(bed type)    Mobility Interventions: Pressure redistribution bed/mattress (bed type)    Nutrition Interventions: Document food/fluid/supplement intake    Friction and Shear Interventions: HOB 30 degrees or less, Apply protective barrier, creams and emollients        Problem: Falls - Risk of  Goal: *Absence of Falls  Description: Document Schmid Fall Risk and appropriate interventions in the flowsheet.  Outcome: Progressing Towards Goal  Note: Fall Risk Interventions:  Mobility Interventions: Communicate number of staff needed for ambulation/transfer, Bed/chair exit alarm, Patient to call before getting OOB    Mentation Interventions: Bed/chair exit alarm, Door open when patient unattended, Reorient patient    Medication Interventions: Bed/chair exit alarm, Patient to call before getting OOB, Teach patient to arise slowly    Elimination Interventions: Call light in reach, Patient to call for help with toileting needs, Bed/chair exit alarm    History of Falls Interventions: Bed/chair exit alarm, Door open when patient unattended

## 2021-02-12 NOTE — Progress Notes (Signed)
Neurosurgery progress note    CC: Post procedure day #6 right M1 thrombectomy.  Groin hematoma noted.  Seen by vascular surgery.  Denies any abdominal or groin pain. Left sided weakness.    Afebrile, VSS  Exam: Alert and awake.  Pleasant and talkative.  Left-sided weakness.  Groin: Hematoma noted with some ecchymosis.  Nontender.  Good distal pulses.    CT and ultrasound reveals right groin hematoma.    PPD #6 right M1 thrombectomy by Dr. Allison Quarry.  Right groin hematoma noted appears to be stable.  Patient has been seen by vascular surgery and no surgical intervention is planned from their standpoint    Appreciate vascular surgery's input.    Discussed with Dr. Allison Quarry

## 2021-02-12 NOTE — Progress Notes (Signed)
PHYSICAL THERAPY TREATMENT 1/1:     Patient: [name] [name] (87 y.o. female)  Room: 6628/6628    Primary Diagnosis: Stroke [facility]) [I63.9]  CVA (cerebral vascular accident) (HCC) [I63.9]  Atrial fibrillation (HCC) [I48.91]  Procedure(s) (LRB):  Procedure Cancelled/Not Performed - ESOPHAGOGASTRODUODENOSCOPY (EGD) (N/A) 5 Days Post-Op   Length of Stay:  6 day(s)   Insurance: Payor: [facility] / Plan: [facility] MEDICARE A & B / Product Type: Medicare /      Date: 02/12/2021  In time: 7:57  Out time:  8:35     Precautions: Falls. Bed level activity only today per vascular team due to hematoma, L sided weakness, mildly thickened liquids     Isolation:  There are currently no Active Isolations       MDRO: No active infections     Equipment: hospital bed      Assessment:      Based on the objective data described below, the patient presents with  - Modified Rankin Score (mRS): 5 - Severe disability; bedridden, incontinent and requiring constant nursing care and attention   - impaired mobility  - active participation in therapeutic exercise for L LE and repositioning in bed   - avoided therapeutic exercise on R LE except for ankle pumps due to large hematoma and pain with any attempt to move R LE   - decreased independence with functional mobility  - decreased tolerance to therapeutic exercises/activities  - high motivation to participate in PT to return to prior level of function  - progressing slowly towards PT goals  - per vascular: Continue bed rest but bed level activity okay    Recommendations:  Recommend continued physical therapy during acute stay. Physical Therapy, Occupational Therapy, and Speech Therapy. Recommend out of bed activity when medically stable to do so to counteract ill effects of bedrest, with assistance from staff as needed.  Discharge Recommendations: Skilled nursing facility (SNF): patient will benefit from further therapy at rehab facility to increase strength and endurance to return to prior level  of function.  Further Equipment Recommendations for Discharge: DME needs to be determined at time of d/c from rehab.     Plan:      Patient will be followed by physical therapy to address goals per initial plan of care.    Subjective:      Patient agreeable to work with PT  Patient reports no pain, just itchiness on R side of torso before treatment and 5/10 pain in R groin/flank with repositioning in bed. No pain at conclusion of treatment.    Objective Data Summary:      Orders, labs, and chart reviewed on [place] [facility]. Communicated with patient's nurse (patient ok to be seen by PT) and vascular PA who recommended bed level activity only    Patient found:     Bed, (+) bed/chair exit alarm, (+) pure wick urine system    Cognitive Status:     Mental Status: Alert and oriented x 4.  Seems to have some visual hallucinations at times, but recalls events of past days, is very aware of current medical situation and plan of care.   Safety/Judgement: appropriate awareness of environment and need for assistance.    Therapeutic Activities; Functional Mobility and Balance Status:      Focus on functional mobility training, bed mobility training, therapeutic exercises, therapeutic activities. Provided cueing for hand placement, technique, and safety.      Bed mobility:    Functional Status  Comments   Supine to sit dependent Used bed controls for supine <> sit   Sit to supine dependent    Scooting Max Assist, pt attempting to help with L LE Bed in trendelenberg            Functional Balance  Sitting      Static sitting   Poor static: patient requires handhold support and moderate to maximal assistance to maintain position, pt tends to slump/lean to L side even when supported by bed    Dynamic sitting   not tested       Therapeutic Exercises:      Therapeutic exercises  2 x 10 reps, gentle AA/AROM L LE:   ankle pumps, heel slides in supine, hip ab/duction in supine, short arc quads (SAQ), quad sets.        Activity  Tolerance:     - requires increased time  - requires increased time, rest breaks and repeated attempts with functional tasks  - motivated to increase activity  - activity is limited by pain  - generalized muscle weakness  - no apparent distress    Final Location:     Positioned in bed, all needs within reach. Patient agrees to call for assistance. As found. Positioned with pillows for comfort. (+) bed alarm. (+) nurse notified.      Communication/Education:     Education: activity as tolerated to counteract ill effects of bedrest, promote healing and return to prior level of function, call staff for assistance, safety, positioning, disposition, role of PT, PT plan of care, all questions answered.    Education provided to: patient   Opportunity for questions and clarification was provided.    Readiness to learn indicated by: trying to perform skills, asking questions, and showing interest    Barriers to learning/limitations:  Yes;  baseline dementia    Comprehension: Patient communicated comprehension    Thank you for this referral.  Heide Guile, PT, DPT

## 2021-02-12 NOTE — Progress Notes (Signed)
Progress Notes by Lisette Abu, PA-C at 02/12/21 6010                Author: Lisette Abu, PA-C  Service: Vascular Surgery  Author Type: Physician Assistant       Filed: 02/12/21 0813  Date of Service: 02/12/21 0807  Status: Attested           Editor: Lisette Abu, PA-C (Physician Assistant)  Cosigner: Michael Litter, MD at 02/12/21 [phone]          Attestation signed by Michael Litter, MD at 02/12/21 1403          Seen and examined independently   Agree with the note   Would not plan on exploration / drainage as no PSA to repair and no evidence of active bleeding.   Following   Chalmers Guest. Sherryll Burger, MD, FACS   Office:  [phone]   Pager:  [phone]                                 [facility]    Progress Note                                                                 Admit Date: 02/06/2021     Assessment & Plan:     [name] [name] is a 87 y.o. female with acute CVA s/p emergent thrombectomy on 11/24, R flank/groin hematoma, ABLA on chronic anemia      VSS   Hgb 7.7 today, continue to trend   CTA and duplex reviewed   2+ R pedal pulses      No plan for emergent vascular surgical intervention        Subjective:     Patient states she has pain with movement.        Objective:     Visit Vitals      BP  109/63     Pulse  67     Temp  97.7 ??F (36.5 ??C)     Resp  17     Ht  5\' 3"  (1.6 m)     Wt  71.7 kg (158 lb 1.1 oz)     SpO2  98%        BMI  28.00 kg/m??        Gen: AAOx3, afebrile, NAD   Cardiac: RR   Pulm: normal resp effort   Abd: soft, NT/ND   Skin: warm, dry, extensive ecchymosis to r flank, groin and across mons pubis   Wound: R groin access site with hematoma c/d/I, no surrounding erythema, edema   Pulses: 2+ R DP         Labs      CBC w/Diff     Lab Results      Component  Value  Date/Time        WBC  9.4  02/12/2021 04:49 AM        RBC  2.55 (L)  02/12/2021 04:49 AM        HCT  23.9 (L)  02/12/2021 04:49 AM        MCV  93.7  02/12/2021 04:49 AM        MCH  30.2   02/12/2021 04:49 AM        MCHC  32.2  02/12/2021 04:49 AM        RDW  13.4  05/04/2012 09:52 AM                  Basic Metabolic Profile     Lab Results      Component  Value  Date        NA  138  02/12/2021        CO2  23  02/12/2021        BUN  10  02/12/2021                  Coagulation     Lab Results      Component  Value  Date        INR  1.2 (H)  02/06/2021        APTT  31.5  08/27/2017                PVL/Radiology reviewed   RIGHT LEG   A duplex scan of the arteries and veins in the right groin region was   performed. There was no evidence of a pseudoaneurysm or an arteriovenous   fistula noted in the right groin region. The common femoral, superficial   femoral and deep femoral arteries are multiphasic. The common femoral vein   and proximal femoral vein were compressible with no evidence of   intraluminal thrombus. A hematoma was visualize in the proximal groin   region measuring 12 cm x 15cm with heterogenous echos.      Data: I independently reviewed the images of the CTA         Lisette Abu, [facility]   [phone] pager (M-F 7a-4p)   [phone] office, after hours, weekends         02/12/2021, 8:08 AM

## 2021-02-12 NOTE — Progress Notes (Signed)
CM following     PT /OT rec SNF >FOC ACOC    Referred in CC link

## 2021-02-12 NOTE — Progress Notes (Signed)
 OCCUPATIONAL THERAPY TREATMENT     Patient: [name] [name] (87 y.o. female)  Room: 6628/6628    Primary Diagnosis: Stroke [facility]) [I63.9]  CVA (cerebral vascular accident) (HCC) [I63.9]  Atrial fibrillation (HCC) [I48.91]   Procedure(s) (LRB):  Procedure Cancelled/Not Performed - ESOPHAGOGASTRODUODENOSCOPY (EGD) (N/A) 5 Days Post-Op  Date of Admission: 02/06/2021   Length of Stay:  6 day(s)  Insurance: Payor: [facility] MEDICARE / Plan: [facility] MEDICARE A & B / Product Type: Medicare /      Date: 02/12/2021  In time:  0915        Out time:  1001    Isolation:  There are currently no Active Isolations       MDRO: No active infections    Precautions: Falls. Bed level activity only today per vascular team due to hematoma, L sided weakness, mildly thickened liquids  Ordered weight bearing status: None    Current diet order: ADULT DIET Regular; Mildly Thick (Nectar)    ASSESSMENT:    Based on the objective data described below, the patient presents with     - noted LUE swelling, notified nursing  - no c/o pain this session  - educated patient on positioning for LUE to decrease edema. Patient verbalized understanding  - patient participated in bilateral integration to improve coordination  - per vascular: Continue bed rest but bed level activity okay       PLAN: 1-5x/week    Recommendations:  Recommend continued skilled occupational therapy intervention to address above impairments.  Recommend out of bed activity to counteract ill effects of bedrest, with assistance from staff as needed.    Discharge Recommendations: skilled nursing facility (SNF).  Further Equipment Recommendations for Discharge: DME needs to be determined at time of d/c from rehab     Education/ communication:     Barriers to learning/limitations:  Yes;  difficulty processing new information, decreased cognition  Education provided to: patient on (+) role of OT, (+) OT plan of care, (+) Instructed patient in the benefits of maintaining activity tolerance,  functional mobility, and independence with self care tasks during acute stay  to ensure safe return home and to baseline. Encouraged patient to increase frequency and duration OOB, be out of bed for all meals, perform daily ADLs (as approved by RN/MD regarding bathing etc), and performing functional mobility to/from bathroom with staff assistance as needed., (+) staff assistance with mobility, (+) change positions frequently, (+) functional mobility, (+) safety  Educational handouts issued: none this session  Patient / family response to education: showing interest, trying to perform skills    SUBJECTIVE:     Patient reports, I would love to brush my teeth.  OBJECTIVE DATA SUMMARY     Orders, labs, occupational therapy and chart reviewed on [name] [name]. Communicated with nursing staff. Patient cleared to participate in Occupational Therapy treatment.    Patient found: Bed, (+) bed/chair exit alarm, (+) telemetry, (+) IV    Most recent value for oxygen in flow sheets:  O2 Device: None (Room air) (02/11/21 1930)        Pain assessment: none reported    Cognitive:   Mental status:   Orientation: Patient is oriented x 3  Communication: grossly intact  Attention Span:  fair (15-16min)  Follows commands: able to follow simple 1 step commands, delayed response  Safety/Judgement: needs cueing for safety and precautions    Activities of Daily Living:  Eating:           -  independent, set-up in hospital setting, increased time  Grooming:     - stand-by assistance, set-up, increased time, oral hygiene, Bed level  Grooming:     - moderate assistance- applied deodorant to bilateral underarms. Patient able to assist and lift LUE with RUE for COTA to apply   UB bathing:   - moderate assistance, set-up, increased time, sponge bath, bed level  Educated patient on positioning to decrease edema in LUE    Mobility:  - not tested    Activity Tolerance:   - fair tolerance to activity during session  - requires increased  time    Neuro-Re-Education/Balance:  Bilateral Integration: to promote rehabilitation of symmetrical/alternating movements of the UE and improve bilateral coordinaiton  - grasping containers with left hand, minimum assistance from COTA LUE,right hand twisting/opening caps  - grasping containers with left hand, minimum/moderate assistance from COTA LUE, right hand grasping bilaterally, bringing toward mouth for oral hygiene    Therapeutic Exercises:   none this session    Final Location: Patient positioned in bed, all needs within reach, agrees to call for assistance, (+) bed/chair exit alarm, nursing staff notified    [name] [name], OTA  February 12, 2021

## 2021-02-12 NOTE — Progress Notes (Signed)
 SPEECH LANGUAGE PATHOLOGY   BEDSIDE DYSPHAGIA TREATMENT AND DISCHARGE     Patient: [name] [name] (87 y.o. female) [birth year]  Room: 6628/6628  Primary Diagnosis: Stroke [facility]) [I63.9]  CVA (cerebral vascular accident) (HCC) [I63.9]  Atrial fibrillation (HCC) [I48.91]    Procedure(s) (LRB):  Procedure Cancelled/Not Performed - ESOPHAGOGASTRODUODENOSCOPY (EGD) (N/A) 5 Days Post-Op    Isolation:  There are currently no Active Isolations       MDRO: No current active infections  PPE: surgical mask, gloves  Precautions:  universal and aspiration     Date: 02/12/2021  Start Time:  8:53 End Time:  9:03    TREATMENT:   Orientation: Pt awake, alert, able to follow contextual commands    Respiratory Status: RA  Treatment: Dysphagia tx completed b/s with tolerance/ safety of IDDSI 7- regular, IDDSI 2 mildly thick (nectar), therapeutic trials of IDDSI 0- thin for possible diet advancement, and use of compensatory swallow strategies.  Trials of thin liquids via tsp and cup with mildly delayed swallow onset and intermittent wet vocal quality with cues to clear throat.  Mildly thick liquids via cup with no overt s/sx aspiration.  Pt demo positive rotary chew and effective oral clearance with regular solids with no overt clinical s/sx aspiration.   Pt is a candidate for free water -ice chips and small sips of thin liquids PRN, in between meals, and after completion of oral care-following strict aspiration precautions.  Would continue IDDSI-2 (mildly thickened liquids) during meals as the long term safest liquid to prevent prandial aspiration due to presence of cervical osteophytes at C3/C4 causing decreased epiglottic ROM. No further skilled ST services warranted at this time, however ST available for re-consult as indicated.     Pt currently able to tolerate safest, least restrictive diet and meds without difficulty.    Functional Oral Intake Scale: Level 5: Total oral diet of multiple consistencies but requiring special  preparation or compensations     PLAN/ RECOMMENDATIONS:     Diet Recommendations:  continue IDDSI 7- regular, IDDSI 2 mildly thick (nectar)  Therapy Recommendations: Pt has achieved maximum gains from skilled SLP intervention(s).  SLP will sign off and be avail in the future if needed.      SUBJECTIVE:    Patient Stated: He taught me that.   Pain Prior to Treatment: no pain reported   Pain Following Treatment: no pain reported     OBJECTIVE:   Consistencies: thin and mildly thick (nectar) via cup, regular  Oral Phase: WFL  Pharyngeal Phase: delayed initiation and gurgly vocal quality    Safety: Following session, pt tray table, phone, and call bell within reach   Modified Rankin Score (MRS): Defer to OT/ PT    EDUCATION:   Education Provided: POC, diet recommendations, and compensatory strategies for swallowing  Individual Educated: patient  Comprehension: pt communicated Armed forces logistics/support/administrative officer Education: Educated Nurse to POC, diet recommendations, and discharge recommendations.     Thank you,      Isaiah Lima MS CCC-SLP  Speech-Language Pathologist  [phone] or Secure Chat

## 2021-02-12 NOTE — Progress Notes (Signed)
Progress Note             Daily Progress Note: 02/12/2021    Assessment/Plan:        Appreciate vasc surgery and neurosurg assistance.     Slight drift in H/H, 7.7/23.9.   BP lowish but stable.   No cardiopulmonary c/o.     Afebrile.   WBCs nl.   AF with CVR, PVCs.   Less confused today.     Lungs clear.     Large hematoma right flank/abdomen/groin.     Motor strength left arm/leg stable +4/5.         S/p acute R MCA CVA, likely sec A Fib.   Had been off anticoagulation due to high fall risk, multiple falls, multifactorial.       -s/p emergent thrombectomy.   Much improved neuro status compared to admission.  2.   Large right hematoma with acute blood loss anemia, s/p transfusion PRBCs x 2.   Last dose Eliquis 11/28 am.   CBC q 12 next 24.   Transfuse for Hgb < 7.0.   No S/S infection, monitor.  3.   Anticoagulation DCd sec #2.  4.   Chronic AF, CVR.  5.   Chr diastolic CHF, EF 73% (10/31/20).  6.   Hypothyroidism.   Resume replacement.  7.   RA.   On Plaquenil.     8.   Cirrhosis of liver prob sec hemochromatosis (+/- Methotrexate).   Ammonia nl.   DC Rifaxamin.  9.   Hepatic encephalopathy with hallucinations +/- superimposed Lewy Body dementia.  10.   DNR.       Subjective:      Review of Systems:  ROS    Objective:   Physical Exam: Physical Exam  Cardiovascular:      Rate and Rhythm: Normal rate. Rhythm irregular.   Pulmonary:      Breath sounds: Normal breath sounds.   Abdominal:      General: There is no distension.      Palpations: Abdomen is soft.      Comments: Hernia not distended.   Musculoskeletal:      Right lower leg: No edema.      Left lower leg: No edema.   Skin:     Comments: Large right flank/abdomen hematoma.   Neurological:      Mental Status: She is alert.       Visit Vitals  BP (!) 97/58 (BP 1 Location: Right upper arm, BP Patient Position: Sitting)   Pulse 87   Temp 98.2 ??F (36.8 ??C)   Resp 17   Ht 5\' 3"  (1.6 m)   Wt 71.7 kg (158 lb 1.1 oz)   SpO2 97%   BMI 28.00 kg/m??      O2 Device: None  (Room air)    Temp (24hrs), Avg:98.1 ??F (36.7 ??C), Min:97.7 ??F (36.5 ??C), Max:98.4 ??F (36.9 ??C)    No intake/output data recorded.   11/28 1901 - 11/30 0700  In: 698.1   Out: 900 [Urine:900]      Data Review:       24 Hour Results:  Recent Results (from the past 24 hour(s))   GLUCOSE, POC    Collection Time: 02/11/21 11:41 AM   Result Value Ref Range    Glucose (POC) 186 (H) 65 - 105 mg/dL   CBC WITH AUTOMATED DIFF    Collection Time: 02/11/21 11:43 AM   Result Value Ref Range    WBC 9.3 4.0 -  11.0 1000/mm3    RBC 2.75 (L) 3.60 - 5.20 M/uL    HGB 8.5 (L) 13.0 - 17.2 gm/dl    HCT 47.8 (L) 29.5 - 50.0 %    MCV 92.4 80.0 - 98.0 fL    MCH 30.9 25.4 - 34.6 pg    MCHC 33.5 30.0 - 36.0 gm/dl    PLATELET 621 308 - 657 1000/mm3    MPV 10.6 (H) 6.0 - 10.0 fL    RDW-SD 51.6 (H) 36.4 - 46.3      NRBC 0 0 - 0      IMMATURE GRANULOCYTES 1.0 0.0 - 3.0 %    NEUTROPHILS 63.9 34 - 64 %    LYMPHOCYTES 18.6 (L) 28 - 48 %    MONOCYTES 13.5 (H) 1 - 13 %    EOSINOPHILS 2.5 0 - 5 %    BASOPHILS 0.5 0 - 3 %   GLUCOSE, POC    Collection Time: 02/11/21  5:49 PM   Result Value Ref Range    Glucose (POC) 320 (H) 65 - 105 mg/dL   GLUCOSE, POC    Collection Time: 02/11/21 11:23 PM   Result Value Ref Range    Glucose (POC) 151 (H) 65 - 105 mg/dL   CBC WITH AUTOMATED DIFF    Collection Time: 02/12/21  4:49 AM   Result Value Ref Range    WBC 9.4 4.0 - 11.0 1000/mm3    RBC 2.55 (L) 3.60 - 5.20 M/uL    HGB 7.7 (L) 13.0 - 17.2 gm/dl    HCT 84.6 (L) 96.2 - 50.0 %    MCV 93.7 80.0 - 98.0 fL    MCH 30.2 25.4 - 34.6 pg    MCHC 32.2 30.0 - 36.0 gm/dl    PLATELET 952 841 - 324 1000/mm3    MPV 10.7 (H) 6.0 - 10.0 fL    RDW-SD 52.7 (H) 36.4 - 46.3      NRBC 0 0 - 0      IMMATURE GRANULOCYTES 0.6 0.0 - 3.0 %    NEUTROPHILS 58.2 34 - 64 %    LYMPHOCYTES 25.5 (L) 28 - 48 %    MONOCYTES 13.7 (H) 1 - 13 %    EOSINOPHILS 1.8 0 - 5 %    BASOPHILS 0.2 0 - 3 %   METABOLIC PANEL, BASIC    Collection Time: 02/12/21  4:49 AM   Result Value Ref Range    Potassium 4.0 3.5 -  5.1 mEq/L    Chloride 108 (H) 98 - 107 mEq/L    Sodium 138 136 - 145 mEq/L    CO2 23 20 - 31 mEq/L    Glucose 121 (H) 74 - 106 mg/dl    BUN 10 9 - 23 mg/dl    Creatinine 4.01 (L) 0.55 - 1.02 mg/dl    GFR est AA >02.7      GFR est non-AA >60      Calcium 8.2 (L) 8.7 - 10.4 mg/dl    Anion gap 7 5 - 15 mmol/L   GLUCOSE, POC    Collection Time: 02/12/21  5:58 AM   Result Value Ref Range    Glucose (POC) 140 (H) 65 - 105 mg/dL       Problem List:  Problem List as of 02/12/2021 Date Reviewed: 21-Feb-2021            Codes Class Noted - Resolved    COVID-19 ICD-10-CM: U07.1  ICD-9-CM: 079.89  10/21/2020 - Present  Overview Signed 11/01/2020  1:27 PM by Einar Gip, MD     10/21/20 SARS CoV-2 detected             Stroke [facility]) ICD-10-CM: I63.9  ICD-9-CM: 434.91  02/06/2021 - Present        CVA (cerebral vascular accident) [facility]) ICD-10-CM: I63.9  ICD-9-CM: 434.91  02/06/2021 - Present        Atrial fibrillation (HCC) ICD-10-CM: I48.91  ICD-9-CM: 427.31  10/31/2020 - Present    Overview Addendum 11/06/2020 11:15 AM by Einar Gip, MD     Noted this AM (10/31/20)  Rate controlled (84 BPM)  No previously documented hx of AFIB  CHADS2VASc = 6  (08/27/17 CT Acute interhemispheric fissure subdural hemorrhage. Chronic small vessel ischemic change)  ECHO:  EF 75%< LV 25/44, S/PW 9/10, LAVI 28, Mild MR, Mod TR, RVSP 43             Other partial intestinal obstruction (HCC) ICD-10-CM: K56.690  ICD-9-CM: 560.89  10/31/2020 - Present    Overview Addendum 11/01/2020  1:28 PM by Einar Gip, MD     10/30/20 CT Small bowel obstruction secondary to presumed small bowel containing  umbilical hernia. Stranding within the hernia sac suggesting engorgement. No  pneumatosis.  11/01/20 SURG We will continue to follow the patient to ensure she does not develop worsening incarceration and bowel obstruction from the hernia.  Currently if we can delay the surgery she would likely benefit from decreased risk of postoperative complications              Metabolic encephalopathy ICD-10-CM: G93.41  ICD-9-CM: 348.31  10/21/2020 - Present        Contusion of left hip ICD-10-CM: R67.89FY  ICD-9-CM: 924.01  10/21/2020 - Present        Left elbow contusion ICD-10-CM: S50.02XA  ICD-9-CM: 923.11  10/21/2020 - Present        Multiple falls ICD-10-CM: R29.6  ICD-9-CM: V15.88  10/21/2020 - Present        Deep venous thrombosis (HCC) ICD-10-CM: I82.409  ICD-9-CM: 453.40  Unknown - Present        Subdural hematoma caused by concussion ICD-10-CM: S06.5XAA  ICD-9-CM: 852.29  08/27/2017 - Present        Subdural hematoma ICD-10-CM: S06.5XAA  ICD-9-CM: 432.1  08/27/2017 - Present       Medications reviewed  Current Facility-Administered Medications   Medication Dose Route Frequency    0.9% sodium chloride infusion 250 mL  250 mL IntraVENous PRN    promethazine (PHENERGAN) 12.5 mg in NS 50 mL IVPB  12.5 mg IntraVENous Q6H PRN    0.9% sodium chloride infusion 250 mL  250 mL IntraVENous PRN    atorvastatin (LIPITOR) tablet 40 mg  40 mg Oral QHS    acetaminophen (TYLENOL) tablet 1,000 mg  1,000 mg Oral Q6H PRN    saline peripheral flush soln 10-20 mL  10-20 mL IntraVENous PRN    [Held by provider] apixaban (ELIQUIS) tablet 5 mg  5 mg Oral BID    albuterol-ipratropium (DUO-NEB) 2.5 MG-0.5 MG/3 ML  3 mL Nebulization Q4H PRN    [Held by provider] furosemide (LASIX) tablet 20 mg  20 mg Oral DAILY    hydrOXYchloroQUINE (PLAQUENIL) tablet 200 mg  200 mg Oral BID    [Held by provider] lactulose (CHRONULAC) 10 gram/15 mL solution 30 mL  20 g Oral BID    levothyroxine (SYNTHROID) tablet 125 mcg  125 mcg Oral ACB    [Held  by provider] lisinopriL (PRINIVIL, ZESTRIL) tablet 10 mg  10 mg Oral DAILY    [Held by provider] metoprolol tartrate (LOPRESSOR) tablet 25 mg  25 mg Oral Q8H    [Held by provider] potassium chloride (K-DUR, KLOR-CON M20) SR tablet 40 mEq  40 mEq Oral DAILY       Care Plan discussed with: Patient/Family    Total time spent with patient: 40 minutes.    [name], MD  February 12, 2021

## 2021-02-13 LAB — CBC WITH AUTOMATED DIFF
BASOPHILS: 0.5 % (ref 0–3)
BASOPHILS: 0.2 % (ref 0–3)
EOSINOPHILS: 1.5 % (ref 0–5)
EOSINOPHILS: 1.9 % (ref 0–5)
HCT: 27.4 % — ABNORMAL LOW (ref 37.0–50.0)
HCT: 24.2 % — ABNORMAL LOW (ref 37.0–50.0)
HGB: 8.9 gm/dl — ABNORMAL LOW (ref 13.0–17.2)
HGB: 7.7 gm/dl — ABNORMAL LOW (ref 13.0–17.2)
IMMATURE GRANULOCYTES: 0.6 % (ref 0.0–3.0)
IMMATURE GRANULOCYTES: 0.4 % (ref 0.0–3.0)
LYMPHOCYTES: 22.1 % — ABNORMAL LOW (ref 28–48)
LYMPHOCYTES: 26.5 % — ABNORMAL LOW (ref 28–48)
MCH: 30.5 pg (ref 25.4–34.6)
MCH: 30.3 pg (ref 25.4–34.6)
MCHC: 32.5 gm/dl (ref 30.0–36.0)
MCHC: 31.8 gm/dl (ref 30.0–36.0)
MCV: 93.8 fL (ref 80.0–98.0)
MCV: 95.3 fL (ref 80.0–98.0)
MONOCYTES: 10.9 % (ref 1–13)
MONOCYTES: 12 % (ref 1–13)
MPV: 10.3 fL — ABNORMAL HIGH (ref 6.0–10.0)
MPV: 10.4 fL — ABNORMAL HIGH (ref 6.0–10.0)
NEUTROPHILS: 64.4 % — ABNORMAL HIGH (ref 34–64)
NEUTROPHILS: 59 % (ref 34–64)
NRBC: 0 (ref 0–0)
NRBC: 0 (ref 0–0)
PLATELET: 186 10*3/uL (ref 140–450)
PLATELET: 198 10*3/uL (ref 140–450)
RBC: 2.92 M/uL — ABNORMAL LOW (ref 3.60–5.20)
RBC: 2.54 M/uL — ABNORMAL LOW (ref 3.60–5.20)
RDW-SD: 52.7 — ABNORMAL HIGH (ref 36.4–46.3)
RDW-SD: 53.1 — ABNORMAL HIGH (ref 36.4–46.3)
WBC: 10.2 10*3/uL (ref 4.0–11.0)
WBC: 8.2 10*3/uL (ref 4.0–11.0)

## 2021-02-13 LAB — METABOLIC PANEL, BASIC
Anion gap: 8 mmol/L (ref 5–15)
BUN: 11 mg/dl (ref 9–23)
CO2: 23 mEq/L (ref 20–31)
Calcium: 7.9 mg/dl — ABNORMAL LOW (ref 8.7–10.4)
Chloride: 109 mEq/L — ABNORMAL HIGH (ref 98–107)
Creatinine: 0.47 mg/dl — ABNORMAL LOW (ref 0.55–1.02)
GFR est AA: >60
GFR est non-AA: >60
Glucose: 100 mg/dl (ref 74–106)
Potassium: 3.8 mEq/L (ref 3.5–5.1)
Sodium: 140 mEq/L (ref 136–145)

## 2021-02-13 LAB — MAGNESIUM
Magnesium: 1.5 mg/dL — ABNORMAL LOW (ref 1.6–2.6)
Magnesium: 1.5 mg/dL — ABNORMAL LOW (ref 1.6–2.6)

## 2021-02-13 LAB — CBC WITH AUTO DIFFERENTIAL
Basophils %: 0.5 % (ref 0–3)
Basophils %: 0.2 % (ref 0–3)
Eosinophils %: 1.5 % (ref 0–5)
Eosinophils %: 1.9 % (ref 0–5)
Hematocrit: 27.4 % — ABNORMAL LOW (ref 37.0–50.0)
Hematocrit: 24.2 % — ABNORMAL LOW (ref 37.0–50.0)
Hemoglobin: 8.9 gm/dl — ABNORMAL LOW (ref 13.0–17.2)
Hemoglobin: 7.7 gm/dl — ABNORMAL LOW (ref 13.0–17.2)
Immature Granulocytes: 0.6 % (ref 0.0–3.0)
Immature Granulocytes: 0.4 % (ref 0.0–3.0)
Lymphocytes %: 22.1 % — ABNORMAL LOW (ref 28–48)
Lymphocytes %: 26.5 % — ABNORMAL LOW (ref 28–48)
MCH: 30.5 pg (ref 25.4–34.6)
MCH: 30.3 pg (ref 25.4–34.6)
MCHC: 32.5 gm/dl (ref 30.0–36.0)
MCHC: 31.8 gm/dl (ref 30.0–36.0)
MCV: 93.8 fL (ref 80.0–98.0)
MCV: 95.3 fL (ref 80.0–98.0)
MPV: 10.3 fL — ABNORMAL HIGH (ref 6.0–10.0)
MPV: 10.4 fL — ABNORMAL HIGH (ref 6.0–10.0)
Monocytes %: 10.9 % (ref 1–13)
Monocytes %: 12 % (ref 1–13)
Neutrophils %: 64.4 % — ABNORMAL HIGH (ref 34–64)
Neutrophils %: 59 % (ref 34–64)
Nucleated RBCs: 0 (ref 0–0)
Nucleated RBCs: 0 (ref 0–0)
Platelets: 186 10*3/uL (ref 140–450)
Platelets: 198 10*3/uL (ref 140–450)
RBC: 2.92 M/uL — ABNORMAL LOW (ref 3.60–5.20)
RBC: 2.54 M/uL — ABNORMAL LOW (ref 3.60–5.20)
RDW-SD: 52.7 — ABNORMAL HIGH (ref 36.4–46.3)
RDW-SD: 53.1 — ABNORMAL HIGH (ref 36.4–46.3)
WBC: 10.2 10*3/uL (ref 4.0–11.0)
WBC: 8.2 10*3/uL (ref 4.0–11.0)

## 2021-02-13 LAB — BASIC METABOLIC PANEL
Anion Gap: 8 mmol/L (ref 5–15)
BUN: 11 mg/dl (ref 9–23)
CO2: 23 mEq/L (ref 20–31)
Calcium: 7.9 mg/dl — ABNORMAL LOW (ref 8.7–10.4)
Chloride: 109 mEq/L — ABNORMAL HIGH (ref 98–107)
Creatinine: 0.47 mg/dl — ABNORMAL LOW (ref 0.55–1.02)
EGFR IF NonAfrican American: >60
GFR African American: >60
Glucose: 100 mg/dl (ref 74–106)
Potassium: 3.8 mEq/L (ref 3.5–5.1)
Sodium: 140 mEq/L (ref 136–145)

## 2021-02-13 MED ORDER — POLYETHYLENE GLYCOL 3350 17 GRAM (100 %) ORAL POWDER PACKET
17 gram | Freq: Every day | ORAL | Status: DC
Start: 2021-02-13 — End: 2021-02-19
  Administered 2021-02-14 – 2021-02-17 (×4): via ORAL

## 2021-02-13 MED ORDER — DIPHENHYDRAMINE 25 MG CAP
25 mg | Freq: Four times a day (QID) | ORAL | Status: DC | PRN
Start: 2021-02-13 — End: 2021-02-14
  Administered 2021-02-13: 18:00:00 via ORAL

## 2021-02-13 MED ORDER — MAGNESIUM SULFATE 2 GRAM/50 ML IVPB
2 gram/50 mL (4 %) | Freq: Once | INTRAVENOUS | Status: AC
Start: 2021-02-13 — End: 2021-02-13
  Administered 2021-02-13: 18:00:00 via INTRAVENOUS

## 2021-02-13 MED ORDER — POTASSIUM CHLORIDE SR 20 MEQ TAB, PARTICLES/CRYSTALS
20 mEq | Freq: Once | ORAL | Status: AC
Start: 2021-02-13 — End: 2021-02-13
  Administered 2021-02-13: 19:00:00 via ORAL

## 2021-02-13 MED FILL — HYDROXYCHLOROQUINE 200 MG TAB: 200 mg | ORAL | Qty: 1

## 2021-02-13 MED FILL — POTASSIUM CHLORIDE SR 20 MEQ TAB, PARTICLES/CRYSTALS: 20 mEq | ORAL | Qty: 1

## 2021-02-13 MED FILL — MAGNESIUM SULFATE 2 GRAM/50 ML IVPB: 2 gram/50 mL (4 %) | INTRAVENOUS | Qty: 50

## 2021-02-13 MED FILL — LEVOTHYROXINE 125 MCG TAB: 125 mcg | ORAL | Qty: 1

## 2021-02-13 MED FILL — DIPHENHYDRAMINE 25 MG CAP: 25 mg | ORAL | Qty: 1

## 2021-02-13 MED FILL — ATORVASTATIN 40 MG TAB: 40 mg | ORAL | Qty: 1

## 2021-02-13 NOTE — Progress Notes (Signed)
 PHYSICAL THERAPY TREATMENT    Patient: [name] [name] (87 y.o. female)  Room: 6628/6628    Date: 02/13/2021  Start Time:  1132  End Time:  1230    Primary Diagnosis: Stroke [facility]) [I63.9]  CVA (cerebral vascular accident) (HCC) [I63.9]  Atrial fibrillation (HCC) [I48.91]  Procedure(s) (LRB):  Procedure Cancelled/Not Performed - ESOPHAGOGASTRODUODENOSCOPY (EGD) (N/A) 6 Days Post-Op     Precautions: Falls. Bed level activity only today per vascular team due to hematoma, L sided weakness, mildly thickened liquids    Isolation:  There are currently no Active Isolations       MDRO: No active infections     Orders reviewed, chart reviewed, and initial evaluation completed on Shavontae K Skelton. Cleared by RN.     ASSESSMENT :  Based on the objective data described below, the patient presents with      - cotx w OT to maximize pt safety as pt's is dependent. PT focus on mobility and balance, OT focus on ADLs  - Nursing consulted Vascular and cleared pt to incr mobility  - pt pleasant and cooperative  - pain well managed t.o. session, some incr' discomfort R side during mobility  - difficulty flexing R knee dt previous knee replacement that pt reported didn't take  - sacral wound noted. RN notified and assessed  - bed mobility maxA x2 this date w use of drawsheet  - pt modA EOB w use of bilateral railings, req'rd constant cueing and assist to return to midline. L lean. Pt sat EOB to develop core strength and postural awareness  - 1 trial sit<>stand modA x2 w SaraStedy, assist to maintain LUE grip on SS. Pt able to tolerate ~96min standing w minAx2  - sit>sup maxA x2  - Scoot to [facility] dep x2  - pt set up for comfort, OT present at end of session    - Modified Rankin Score (mRS): 5 - Severe disability; bedridden, incontinent and requiring constant nursing care and attention     Progression toward goals:  [x]           Improving appropriately and progressing toward goals  []           Improving slowly and progressing toward  goals  []           Not making progress toward goals and plan of care will be adjusted    Patient will benefit from skilled intervention to address the above impairments.  Patient's rehabilitation potential is considered to be Good        PLAN :  Planned Interventions:  Functional mobility training Nutritional therapist Therapeutic exercises Therapeutic activities Neuro muscular re-education AD training Patient/caregiver education    Frequency/Duration: Patient will be followed by physical therapy 3x / Week and 5x / Week to address goals.    Recommendations:  Physical Therapy, Occupational Therapy, and Speech Therapy  Discharge Recommendations: SNF  Further Equipment Recommendations for Discharge: tbd per rehab        SUBJECTIVE:   Patient c.o. itchy back    OBJECTIVE DATA SUMMARY:     Patient found: bed, purewick, telemetry, IV, bed alarm.    Pain Assessment before PT session: no pain, itchiness  Pain Location:  R side  Pain Assessment after PT session: not rated  Pain Location:  R side  []           Yes, patient had pain medications  []           No,  Patient has not had pain medications  []           Nurse notified    COGNITIVE STATUS:     Mental Status: Oriented x3.  Communication: normal.  Follows commands: intact.  General Cognition: intact .      Functional Mobility and Balance Status:     Supine to sit -  max. assist and 2 person  Sit to Supine -  max. assist and 2 person  Sit to Stand -  mod. assist and 2 person  Stand to Sit -  mod. assist and 2 person  Scoot to [facility] - dependent and 2 person    Balance:   Static Sitting Balance -  poor  Dynamic Sitting Balance -  poor  Static Standing Balance -  poor  Dynamic Standing Balance -  poor      Therapeutic Exercises:      Lower Extremities:  Supine ankle pumps, quad set, hip abd, heel slides  10 reps    Activity Tolerance:   good    Final Location:   bed, all needs close, nurse and CP notified, agrees to call for  assistance    COMMUNICATION/EDUCATION:   Education: Patient, Benefit of activity while hospitalized, Call for assistance, Staff assistance with mobility, HEP, Safety, Functional mobility, and Role of PT  Barriers to Learning/Limitations: yes;  baseline dementia    Please refer to care plan and patient education section for further details.    Thank you for this referral.  Dorn CHRISTELLA Rack, PTA

## 2021-02-13 NOTE — Progress Notes (Signed)
CM following    ACOC noted to be FOC. I reached out to liaison to check on bed availability

## 2021-02-13 NOTE — Progress Notes (Signed)
Progress Notes by Lisette Abu, PA-C at 02/13/21 9147                Author: Lisette Abu, PA-C  Service: Vascular Surgery  Author Type: Physician Assistant       Filed: 02/13/21 0847  Date of Service: 02/13/21 0844  Status: Attested           Editor: Lisette Abu, PA-C (Physician Assistant)  Cosigner: Michael Litter, MD at 02/13/21 1026          Attestation signed by Michael Litter, MD at 02/13/21 1026          Seen and examined   Feeling better   Hematoma / ecchymosis stable   Hb stable      No plans for surgical intervention   Mobilize   Available as needed      Thanks   Rasesh M. Sherryll Burger, MD, FACS   Office:  [phone]   Pager:  [phone]                                 [facility]   Lisette Abu, [place]   [phone]   Progress Note         Visit Vitals      BP  112/60 (BP 1 Location: Right upper arm, BP Patient Position: Supine)        Pulse  83     Temp  97.5 ??F (36.4 ??C)     Resp  18     Ht  5\' 3"  (1.6 m)     Wt  71.7 kg (158 lb 1.1 oz)     SpO2  97%        BMI  28.00 kg/m??        87 yo F with acute CVA s/p emergent thrombectomy 02/06/21, R flank/groin hematoma, ABLA on chronic anemia      VSS   Hgb stable   Distal pulses remain intact   Hematoma and ecchymosis stable      No plans for intervention   Will sign off, available as needed.         02/08/21, PA-C   [facility]   (M-F) 7-16;   Office: [phone]   Pager: Lisette Abu   February 13, 2021   8:45 AM

## 2021-02-13 NOTE — Progress Notes (Signed)
Chaplain Follow-Up    Start Visit: 1100  End Visit: 1105    Chaplain conducted a Follow up consultation and Spiritual Assessment for [name] [name], who is a 87 y.o.,female.      The Chaplain provided the following Interventions:  Continued the relationship of care and support.   Offered prayer and assurance of continued prayer on patients behalf.   Chart reviewed.    The following outcomes were achieved:  Patient expressed gratitude for eucharist minister visit.    Assessment:  There are no further spiritual or religious issues which require Spiritual Care Services interventions at this time.     Plan:  Chaplains will continue to follow and will provide pastoral care on an as needed/requested basis.  Chaplain charted for [name] [name].    [name] [name].   Pharmacologist Care   [phone]

## 2021-02-13 NOTE — Progress Notes (Signed)
Progress Note             Daily Progress Note: 02/13/2021    Assessment/Plan:        Appreciate vasc surgery and neurosurg assistance.     Denies cardiopulmonary c/o.   C/o pruritis on back, pain over hematoma when touched/moved, constipation.     VSS.   H/H stable.        Afebrile.   WBCs nl.        AF with CVR, PVCs.        Lungs clear.     Large hematoma right flank/abdomen/groin.     Motor strength left arm/leg stable +4/5.         S/p acute R MCA CVA, likely sec A Fib.   Had been off anticoagulation due to high fall risk, multiple falls, multifactorial.       -s/p emergent thrombectomy.   Much improved neuro status compared to admission.  2.   Large right hematoma with acute blood loss anemia, s/p transfusion PRBCs x 2.   Last dose Eliquis 11/28 am.   CBC q am x several days.   No S/S infection, monitor.  3.   Anticoagulation DCd sec #2.  4.   Chronic AF, CVR.  5.   Chr diastolic CHF, EF 99% (10/31/20).  6.   Hypothyroidism.   Resume replacement.  7.   RA.   On Plaquenil.     8.   Cirrhosis of liver prob sec hemochromatosis (+/- Methotrexate).   Ammonia nl.   DC Rifaxamin.  9.   Hepatic encephalopathy with hallucinations +/- superimposed Lewy Body dementia.  10.   DNR.  11.   SCDs for DVT prophylaxis.  12.   Hypomagnesemia, replace.  13.   HTN, meds on hold.      Will eventually need rehab.         Subjective:      Review of Systems:  ROS    Objective:   Physical Exam: Physical Exam  Cardiovascular:      Rate and Rhythm: Normal rate. Rhythm irregular.   Pulmonary:      Breath sounds: Normal breath sounds.   Abdominal:      General: There is no distension.      Palpations: Abdomen is soft.      Comments: Hernia not distended.   Musculoskeletal:      Right lower leg: No edema.      Left lower leg: No edema.   Skin:     Comments: Large right flank/abdomen hematoma.   Neurological:      Mental Status: She is alert.       Visit Vitals  BP 112/60 (BP 1 Location: Right upper arm, BP Patient Position: Supine)   Pulse 83    Temp 97.5 ??F (36.4 ??C)   Resp 18   Ht 5\' 3"  (1.6 m)   Wt 71.7 kg (158 lb 1.1 oz)   SpO2 97%   BMI 28.00 kg/m??      O2 Device: None (Room air)    Temp (24hrs), Avg:97.9 ??F (36.6 ??C), Min:97.5 ??F (36.4 ??C), Max:98.1 ??F (36.7 ??C)    No intake/output data recorded.   11/29 1901 - 12/01 0700  In: 220 [P.O.:220]  Out: 575 [Urine:575]      Data Review:       24 Hour Results:  Recent Results (from the past 24 hour(s))   CBC WITH AUTOMATED DIFF    Collection Time: 02/12/21  7:26 PM  Result Value Ref Range    WBC 10.2 4.0 - 11.0 1000/mm3    RBC 2.92 (L) 3.60 - 5.20 M/uL    HGB 8.9 (L) 13.0 - 17.2 gm/dl    HCT 81.0 (L) 17.5 - 50.0 %    MCV 93.8 80.0 - 98.0 fL    MCH 30.5 25.4 - 34.6 pg    MCHC 32.5 30.0 - 36.0 gm/dl    PLATELET 102 585 - 277 1000/mm3    MPV 10.3 (H) 6.0 - 10.0 fL    RDW-SD 52.7 (H) 36.4 - 46.3      NRBC 0 0 - 0      IMMATURE GRANULOCYTES 0.6 0.0 - 3.0 %    NEUTROPHILS 64.4 (H) 34 - 64 %    LYMPHOCYTES 22.1 (L) 28 - 48 %    MONOCYTES 10.9 1 - 13 %    EOSINOPHILS 1.5 0 - 5 %    BASOPHILS 0.5 0 - 3 %   METABOLIC PANEL, BASIC    Collection Time: 02/13/21  5:38 AM   Result Value Ref Range    Potassium 3.8 3.5 - 5.1 mEq/L    Chloride 109 (H) 98 - 107 mEq/L    Sodium 140 136 - 145 mEq/L    CO2 23 20 - 31 mEq/L    Glucose 100 74 - 106 mg/dl    BUN 11 9 - 23 mg/dl    Creatinine 8.24 (L) 0.55 - 1.02 mg/dl    GFR est AA >23.5      GFR est non-AA >60      Calcium 7.9 (L) 8.7 - 10.4 mg/dl    Anion gap 8 5 - 15 mmol/L   CBC WITH AUTOMATED DIFF    Collection Time: 02/13/21  5:38 AM   Result Value Ref Range    WBC 8.2 4.0 - 11.0 1000/mm3    RBC 2.54 (L) 3.60 - 5.20 M/uL    HGB 7.7 (L) 13.0 - 17.2 gm/dl    HCT 36.1 (L) 44.3 - 50.0 %    MCV 95.3 80.0 - 98.0 fL    MCH 30.3 25.4 - 34.6 pg    MCHC 31.8 30.0 - 36.0 gm/dl    PLATELET 154 008 - 676 1000/mm3    MPV 10.4 (H) 6.0 - 10.0 fL    RDW-SD 53.1 (H) 36.4 - 46.3      NRBC 0 0 - 0      IMMATURE GRANULOCYTES 0.4 0.0 - 3.0 %    NEUTROPHILS 59.0 34 - 64 %    LYMPHOCYTES 26.5 (L)  28 - 48 %    MONOCYTES 12.0 1 - 13 %    EOSINOPHILS 1.9 0 - 5 %    BASOPHILS 0.2 0 - 3 %   MAGNESIUM    Collection Time: 02/13/21  5:38 AM   Result Value Ref Range    Magnesium 1.5 (L) 1.6 - 2.6 mg/dL       Problem List:  Problem List as of 02/13/2021 Date Reviewed: February 11, 2021            Codes Class Noted - Resolved    COVID-19 ICD-10-CM: U07.1  ICD-9-CM: 079.89  10/21/2020 - Present    Overview Signed 11/01/2020  1:27 PM by Einar Gip, MD     10/21/20 SARS CoV-2 detected             Stroke [facility]) ICD-10-CM: I63.9  ICD-9-CM: 434.91  02-11-21 - Present  CVA (cerebral vascular accident) [facility]) ICD-10-CM: I63.9  ICD-9-CM: 434.91  02/06/2021 - Present        Atrial fibrillation (HCC) ICD-10-CM: I48.91  ICD-9-CM: 427.31  10/31/2020 - Present    Overview Addendum 11/06/2020 11:15 AM by Einar Gip, MD     Noted this AM (10/31/20)  Rate controlled (84 BPM)  No previously documented hx of AFIB  CHADS2VASc = 6  (08/27/17 CT Acute interhemispheric fissure subdural hemorrhage. Chronic small vessel ischemic change)  ECHO:  EF 75%< LV 25/44, S/PW 9/10, LAVI 28, Mild MR, Mod TR, RVSP 43             Other partial intestinal obstruction (HCC) ICD-10-CM: K56.690  ICD-9-CM: 560.89  10/31/2020 - Present    Overview Addendum 11/01/2020  1:28 PM by Einar Gip, MD     10/30/20 CT Small bowel obstruction secondary to presumed small bowel containing  umbilical hernia. Stranding within the hernia sac suggesting engorgement. No  pneumatosis.  11/01/20 SURG We will continue to follow the patient to ensure she does not develop worsening incarceration and bowel obstruction from the hernia.  Currently if we can delay the surgery she would likely benefit from decreased risk of postoperative complications             Metabolic encephalopathy ICD-10-CM: G93.41  ICD-9-CM: 348.31  10/21/2020 - Present        Contusion of left hip ICD-10-CM: Z61.09UE  ICD-9-CM: 924.01  10/21/2020 - Present        Left elbow contusion ICD-10-CM:  S50.02XA  ICD-9-CM: 923.11  10/21/2020 - Present        Multiple falls ICD-10-CM: R29.6  ICD-9-CM: V15.88  10/21/2020 - Present        Deep venous thrombosis (HCC) ICD-10-CM: I82.409  ICD-9-CM: 453.40  Unknown - Present        Subdural hematoma caused by concussion ICD-10-CM: S06.5XAA  ICD-9-CM: 852.29  08/27/2017 - Present        Subdural hematoma ICD-10-CM: S06.5XAA  ICD-9-CM: 432.1  08/27/2017 - Present       Medications reviewed  Current Facility-Administered Medications   Medication Dose Route Frequency    magnesium sulfate 2 g/50 ml IVPB (premix or compounded)  2 g IntraVENous ONCE    0.9% sodium chloride infusion 250 mL  250 mL IntraVENous PRN    promethazine (PHENERGAN) 12.5 mg in NS 50 mL IVPB  12.5 mg IntraVENous Q6H PRN    0.9% sodium chloride infusion 250 mL  250 mL IntraVENous PRN    atorvastatin (LIPITOR) tablet 40 mg  40 mg Oral QHS    acetaminophen (TYLENOL) tablet 1,000 mg  1,000 mg Oral Q6H PRN    saline peripheral flush soln 10-20 mL  10-20 mL IntraVENous PRN    [Held by provider] apixaban (ELIQUIS) tablet 5 mg  5 mg Oral BID    albuterol-ipratropium (DUO-NEB) 2.5 MG-0.5 MG/3 ML  3 mL Nebulization Q4H PRN    [Held by provider] furosemide (LASIX) tablet 20 mg  20 mg Oral DAILY    hydrOXYchloroQUINE (PLAQUENIL) tablet 200 mg  200 mg Oral BID    [Held by provider] lactulose (CHRONULAC) 10 gram/15 mL solution 30 mL  20 g Oral BID    levothyroxine (SYNTHROID) tablet 125 mcg  125 mcg Oral ACB    [Held by provider] lisinopriL (PRINIVIL, ZESTRIL) tablet 10 mg  10 mg Oral DAILY    [Held by provider] metoprolol tartrate (LOPRESSOR) tablet 25 mg  25 mg Oral Q8H    [  Held by provider] potassium chloride (K-DUR, KLOR-CON M20) SR tablet 40 mEq  40 mEq Oral DAILY       Care Plan discussed with: Patient/Family    Total time spent with patient: 30 minutes.    [name], MD  February 13, 2021

## 2021-02-13 NOTE — Progress Notes (Signed)
OCCUPATIONAL THERAPY TREATMENT     Patient: [name] [name] (87 y.o. female)  Room: 6628/6628    Primary Diagnosis: Stroke [facility]) [I63.9]  CVA (cerebral vascular accident) (HCC) [I63.9]  Atrial fibrillation (HCC) [I48.91]   Procedure(s) (LRB):  Procedure Cancelled/Not Performed - ESOPHAGOGASTRODUODENOSCOPY (EGD) (N/A) 6 Days Post-Op  Date of Admission: 02/06/2021   Length of Stay:  7 day(s)  Insurance: Payor: [facility] / Plan: [facility] MEDICARE A & B / Product Type: Medicare /      Date: 02/13/2021  In time:  1132        Out time:  1235    Isolation:  There are currently no Active Isolations       MDRO: No active infections    Precautions: falls, left sided weakness, mildly thickened liquids  Ordered weight bearing status: None    Current diet order: ADULT DIET Regular; Mildly Thick (Nectar)    ASSESSMENT:    Based on the objective data described below, the patient presents with       - RN/ vascular cleared patient for increased mobility this date  - co-tx with PT to maximize patient safety and benefit. PT focus on mobility. OT focus on ADL's.  - noted improvements in L hand strength since previous session; patient reports she has been using L hand to grasp cup when drinking and using L hand whenever she can  - patient has good carryover from previous session with therapeutic exercises  - noted sacral wound, notified RN and assessed  - patient c/o itching on back, COTA washed and applied lotion  - care partner in room taking vitals beginning of session: BP 107/62, HR 80, 99% O2 saturation on RA  - patient has L lateral lean/ requires max cueing to return to midline  - patient sat EOB 30 mins    - Modified Rankin Score (mRS): 5 - Severe disability; bedridden, incontinent and requiring constant nursing care and attention        PLAN: 1-5x/week    Recommendations:  Recommend continued skilled occupational therapy intervention to address above impairments.  Recommend out of bed activity to counteract ill effects of bedrest,  with assistance from staff as needed.    Discharge Recommendations: skilled nursing facility (SNF).  Further Equipment Recommendations for Discharge: DME needs to be determined at time of d/c from rehab     Education/ communication:     Barriers to learning/limitations:  Yes;  difficulty processing new information, decreased cognition  Education provided to: patient on (+) role of OT, (+) OT plan of care, (+) Instructed patient in the benefits of maintaining activity tolerance, functional mobility, and independence with self care tasks during acute stay  to ensure safe return home and to baseline. Encouraged patient to increase frequency and duration OOB, be out of bed for all meals, perform daily ADLs (as approved by RN/MD regarding bathing etc), and performing functional mobility to/from bathroom with staff assistance as needed., (+) staff assistance with mobility, (+) change positions frequently, (+) functional mobility, (+) safety  Educational handouts issued: none this session  Patient / family response to education: showing interest, trying to perform skills    SUBJECTIVE:     Patient agreeable to OT session  OBJECTIVE DATA SUMMARY     Orders, labs, occupational therapy and chart reviewed on Raphaela K Sakamoto. Communicated with nursing staff. Patient cleared to participate in Occupational Therapy treatment.    Patient found: Bed, (+) bed/chair exit alarm, (+) telemetry, (+) IV, (+) pure  wick urine system, (+) care partner present    Most recent value for oxygen in flow sheets:  O2 Device: None (Room air) (02/12/21 0800)        Pain assessment:     Cognitive:   Mental status:   Orientation: Patient is oriented x 3  Communication: grossly intact  Attention Span:  fair (15-39min)  Follows commands: able to follow simple 1 step commands, delayed response  Safety/Judgement: needs cueing for safety and precautions       Activities of Daily Living:  Drinking: independent, increased time    Mobility:  Supine to sit -   max. assist and 2 person  Sit to Supine -  max. assist and 2 person  Sit to Stand -  mod. assist and 2 person  Stand to Sit -  mod. assist and 2 person  Scoot to [facility] - dependent and 2 person    1 trial sit to stand- modA x 2 person, gait belt, SaraStedy. Patient able to tolerate standing 1 minute. Patient LUE supported to maintain grasp    Activity Tolerance:   - motivated to increase activity  - good- tolerance to activity during session    Neuro Re-education/Balance:  Static sitting balance- poor  Dynamic sitting balance- poor  Static standing balance- poor    Participated in bilateral lean, weight shifting. COTA performed tapping method to left triceps. Patient required modA to come to midline from left side. 1 set 5 reps        Therapeutic Exercises:   -  SHOULDER: left, PROM exercises: 10 reps / 1 set of flexion/ extension. Bilateral integration: patient mirrored COTA on right side, AROM, 10 reps / 1 set of flexion/ extension for neuro re-education to improve bilateral symmetry and create neuro pathways  -  ELBOW:  left , AAROM exercises: 10 reps / 1 set of flexion/extension  -  WRIST: left , PROM exercises: 10 reps / 1 set of flexion/extension, RD/UD  -  HAND/DIGITS:  left , AROM exercises:  5 reps / 1 set of gross flexion/extension, abduction, adduction    Final Location: Patient positioned in bed, all needs within reach, agrees to call for assistance, (+) bed/chair exit alarm, nursing staff notified    Wayland Denis, OTA  February 13, 2021

## 2021-02-14 LAB — CBC WITH AUTOMATED DIFF
BASOPHILS: 0.8 % (ref 0–3)
BASOPHILS: 0.3 % (ref 0–3)
EOSINOPHILS: 1.7 % (ref 0–5)
EOSINOPHILS: 1.3 % (ref 0–5)
HCT: 27.5 % — ABNORMAL LOW (ref 37.0–50.0)
HCT: 30.3 % — ABNORMAL LOW (ref 37.0–50.0)
HGB: 8.7 gm/dl — ABNORMAL LOW (ref 13.0–17.2)
HGB: 9.6 gm/dl — ABNORMAL LOW (ref 13.0–17.2)
IMMATURE GRANULOCYTES: 0.4 % (ref 0.0–3.0)
IMMATURE GRANULOCYTES: 0.4 % (ref 0.0–3.0)
LYMPHOCYTES: 23.7 % — ABNORMAL LOW (ref 28–48)
LYMPHOCYTES: 19.6 % — ABNORMAL LOW (ref 28–48)
MCH: 30.4 pg (ref 25.4–34.6)
MCH: 30.5 pg (ref 25.4–34.6)
MCHC: 31.6 gm/dl (ref 30.0–36.0)
MCHC: 31.7 gm/dl (ref 30.0–36.0)
MCV: 96.2 fL (ref 80.0–98.0)
MCV: 96.2 fL (ref 80.0–98.0)
MONOCYTES: 12.1 % (ref 1–13)
MONOCYTES: 10.4 % (ref 1–13)
MPV: 10.3 fL — ABNORMAL HIGH (ref 6.0–10.0)
MPV: 10.2 fL — ABNORMAL HIGH (ref 6.0–10.0)
NEUTROPHILS: 61.3 % (ref 34–64)
NEUTROPHILS: 68 % — ABNORMAL HIGH (ref 34–64)
NRBC: 0 (ref 0–0)
NRBC: 0 (ref 0–0)
PLATELET: 234 10*3/uL (ref 140–450)
PLATELET: 262 10*3/uL (ref 140–450)
RBC: 2.86 M/uL — ABNORMAL LOW (ref 3.60–5.20)
RBC: 3.15 M/uL — ABNORMAL LOW (ref 3.60–5.20)
RDW-SD: 54.5 — ABNORMAL HIGH (ref 36.4–46.3)
RDW-SD: 54 — ABNORMAL HIGH (ref 36.4–46.3)
WBC: 9.7 10*3/uL (ref 4.0–11.0)
WBC: 10 10*3/uL (ref 4.0–11.0)

## 2021-02-14 LAB — METABOLIC PANEL, BASIC
Anion gap: 8 mmol/L (ref 5–15)
BUN: 9 mg/dl (ref 9–23)
CO2: 23 mEq/L (ref 20–31)
Calcium: 8.1 mg/dl — ABNORMAL LOW (ref 8.7–10.4)
Chloride: 109 mEq/L — ABNORMAL HIGH (ref 98–107)
Creatinine: 0.47 mg/dl — ABNORMAL LOW (ref 0.55–1.02)
GFR est AA: >60
GFR est non-AA: >60
Glucose: 83 mg/dl (ref 74–106)
Potassium: 3.8 mEq/L (ref 3.5–5.1)
Sodium: 140 mEq/L (ref 136–145)

## 2021-02-14 LAB — MAGNESIUM
Magnesium: 1.7 mg/dL (ref 1.6–2.6)
Magnesium: 1.7 mg/dL (ref 1.6–2.6)

## 2021-02-14 LAB — CBC WITH AUTO DIFFERENTIAL
Basophils %: 0.8 % (ref 0–3)
Basophils %: 0.3 % (ref 0–3)
Eosinophils %: 1.7 % (ref 0–5)
Eosinophils %: 1.3 % (ref 0–5)
Hematocrit: 27.5 % — ABNORMAL LOW (ref 37.0–50.0)
Hematocrit: 30.3 % — ABNORMAL LOW (ref 37.0–50.0)
Hemoglobin: 8.7 gm/dl — ABNORMAL LOW (ref 13.0–17.2)
Hemoglobin: 9.6 gm/dl — ABNORMAL LOW (ref 13.0–17.2)
Immature Granulocytes: 0.4 % (ref 0.0–3.0)
Immature Granulocytes: 0.4 % (ref 0.0–3.0)
Lymphocytes %: 23.7 % — ABNORMAL LOW (ref 28–48)
Lymphocytes %: 19.6 % — ABNORMAL LOW (ref 28–48)
MCH: 30.4 pg (ref 25.4–34.6)
MCH: 30.5 pg (ref 25.4–34.6)
MCHC: 31.6 gm/dl (ref 30.0–36.0)
MCHC: 31.7 gm/dl (ref 30.0–36.0)
MCV: 96.2 fL (ref 80.0–98.0)
MCV: 96.2 fL (ref 80.0–98.0)
MPV: 10.3 fL — ABNORMAL HIGH (ref 6.0–10.0)
MPV: 10.2 fL — ABNORMAL HIGH (ref 6.0–10.0)
Monocytes %: 12.1 % (ref 1–13)
Monocytes %: 10.4 % (ref 1–13)
Neutrophils %: 61.3 % (ref 34–64)
Neutrophils %: 68 % — ABNORMAL HIGH (ref 34–64)
Nucleated RBCs: 0 (ref 0–0)
Nucleated RBCs: 0 (ref 0–0)
Platelets: 234 10*3/uL (ref 140–450)
Platelets: 262 10*3/uL (ref 140–450)
RBC: 2.86 M/uL — ABNORMAL LOW (ref 3.60–5.20)
RBC: 3.15 M/uL — ABNORMAL LOW (ref 3.60–5.20)
RDW-SD: 54.5 — ABNORMAL HIGH (ref 36.4–46.3)
RDW-SD: 54 — ABNORMAL HIGH (ref 36.4–46.3)
WBC: 9.7 10*3/uL (ref 4.0–11.0)
WBC: 10 10*3/uL (ref 4.0–11.0)

## 2021-02-14 LAB — BASIC METABOLIC PANEL
Anion Gap: 8 mmol/L (ref 5–15)
BUN: 9 mg/dl (ref 9–23)
CO2: 23 mEq/L (ref 20–31)
Calcium: 8.1 mg/dl — ABNORMAL LOW (ref 8.7–10.4)
Chloride: 109 mEq/L — ABNORMAL HIGH (ref 98–107)
Creatinine: 0.47 mg/dl — ABNORMAL LOW (ref 0.55–1.02)
EGFR IF NonAfrican American: >60
GFR African American: >60
Glucose: 83 mg/dl (ref 74–106)
Potassium: 3.8 mEq/L (ref 3.5–5.1)
Sodium: 140 mEq/L (ref 136–145)

## 2021-02-14 MED ORDER — HYDROXYZINE 25 MG TAB
25 mg | Freq: Three times a day (TID) | ORAL | Status: AC | PRN
Start: 2021-02-14 — End: 2021-02-19
  Administered 2021-02-14 – 2021-02-19 (×3): via ORAL

## 2021-02-14 MED ORDER — MAGNESIUM OXIDE 400 MG TAB
400 mg | Freq: Once | ORAL | Status: AC
Start: 2021-02-14 — End: 2021-02-14
  Administered 2021-02-14: 16:00:00 via ORAL

## 2021-02-14 MED FILL — HYDROXYZINE 25 MG TAB: 25 mg | ORAL | Qty: 1

## 2021-02-14 MED FILL — LEVOTHYROXINE 125 MCG TAB: 125 mcg | ORAL | Qty: 1

## 2021-02-14 MED FILL — HYDROXYCHLOROQUINE 200 MG TAB: 200 mg | ORAL | Qty: 1

## 2021-02-14 MED FILL — POLYETHYLENE GLYCOL 3350 17 GRAM (100 %) ORAL POWDER PACKET: 17 gram | ORAL | Qty: 1

## 2021-02-14 MED FILL — ATORVASTATIN 40 MG TAB: 40 mg | ORAL | Qty: 1

## 2021-02-14 MED FILL — MAGNESIUM OXIDE 400 MG TAB: 400 mg | ORAL | Qty: 1

## 2021-02-14 NOTE — Progress Notes (Signed)
 PHYSICAL THERAPY TREATMENT    Patient: [name] [name] (87 y.o. female)  Room: 6628/6628    Date: 02/14/2021  Start Time:  0750  End Time:  0901    Primary Diagnosis: Stroke [facility]) [I63.9]  CVA (cerebral vascular accident) (HCC) [I63.9]  Atrial fibrillation (HCC) [I48.91]  Procedure(s) (LRB):  Procedure Cancelled/Not Performed - ESOPHAGOGASTRODUODENOSCOPY (EGD) (N/A) 7 Days Post-Op     Precautions: Falls. Bed level activity only today per vascular team due to hematoma, L sided weakness, mildly thickened liquids    Isolation:  There are currently no Active Isolations       MDRO: No active infections     Orders reviewed, chart reviewed, and initial evaluation completed on [name] [name]. Cleared by RN.     ASSESSMENT :  Based on the objective data described below, the patient presents with      - pt benefits from 2 person assist during mobility as she is highly limited functionally dt L wkness, L sh pain, bilat knee pain  - active participation in supine therex, limited ROM  - sup>sit max assist x1 w HOB elev  - EOB modA to SBA w verbal/tactile cueing to find midline, improvement in core control noted, one noted incidence of self correcting to midline am I there?  - deferred OOB dt incr'd LUE pain in shoulder today, pt reported it comes and goes  - sit>sup max assist x1  - scoot to [facility] dependent in trendelenburg  - pt c.o. of persistent itchiness on back, lotion applied. Requested bed pan, rolling max assist x1 L<>R w second person for safety to change sheets and fix bed pan under patient  - nurse notified of incr LUE pain, itchiness    - Modified Rankin Score (mRS): 5 - Severe disability; bedridden, incontinent and requiring constant nursing care and attention     Progression toward goals:  []           Improving appropriately and progressing toward goals  [x]           Improving slowly and progressing toward goals  []           Not making progress toward goals and plan of care will be adjusted    Patient will  benefit from skilled intervention to address the above impairments.  Patient's rehabilitation potential is considered to be Good        PLAN :  Planned Interventions:  Functional mobility training Nutritional therapist Therapeutic exercises Therapeutic activities Neuro muscular re-education AD training Patient/caregiver education    Frequency/Duration: Patient will be followed by physical therapy 3x / Week and 5x / Week to address goals.    Recommendations:  Physical Therapy, Occupational Therapy, and Speech Therapy  Discharge Recommendations: SNF  Further Equipment Recommendations for Discharge: tbd per rehab        SUBJECTIVE:   Patient easily awakened this morning, agreeable to PT    OBJECTIVE DATA SUMMARY:     Patient found: bed, purewick, telemetry, bed alarm.    Pain Assessment before PT session: 0/10  Pain Assessment after PT session: 5/10  Pain Location:  knees, L shoulder  []           Yes, patient had pain medications  []           No, Patient has not had pain medications  [x]           Nurse notified    COGNITIVE STATUS:     Mental Status: Oriented x3.  Communication: normal.  Follows commands: intact.  General Cognition: intact .      Functional Mobility and Balance Status:     Supine to sit -  max. assist  Sit to Supine -  max. assist  Scoot to [facility] - dependent x1 in trendelenburg, and x2 flat  Rolling - mod assist    Balance:   Static Sitting Balance -  poor  Dynamic Sitting Balance -  poor      Therapeutic Exercises:      Lower Extremities:  Supine ankle pumps, quad set, hip abd, heel slides  15 reps x2    Activity Tolerance:   good    Final Location:   bed, all needs close, nurse notified, CP present agrees to call for assistance    COMMUNICATION/EDUCATION:   Education: Patient, Benefit of activity while hospitalized, Call for assistance, Staff assistance with mobility, HEP, Safety, Functional mobility, and Role of PT  Barriers to Learning/Limitations: yes;  baseline  dementia    Please refer to care plan and patient education section for further details.    Thank you for this referral.  Dorn CHRISTELLA Rack, PTA

## 2021-02-14 NOTE — Progress Notes (Signed)
Progress Note             Daily Progress Note: 02/14/2021    Assessment/Plan:        Denies cardiopulmonary c/o.   C/o pruritis on back, pain over hematoma when touched/moved, constipation.     VSS.   H/H stable.        Afebrile.   WBCs nl.        AF with CVR, PVCs.        Lungs clear.     Large hematoma right flank/abdomen/groin.     Motor strength left arm/leg stable +4/5.         S/p acute R MCA CVA, likely sec A Fib.   Had been off anticoagulation due to high fall risk, multiple falls, multifactorial.       -s/p emergent thrombectomy.   Much improved neuro status compared to admission.  2.   Large right hematoma with acute blood loss anemia, s/p transfusion PRBCs x 2.   Last dose Eliquis 11/28 am.   CBC q am x several days.   No S/S infection, monitor.  3.   Anticoagulation DCd sec #2.  4.   Chronic AF, CVR.  5.   Chr diastolic CHF, EF 82% (10/31/20).  6.   Hypothyroidism.   Resume replacement.  7.   RA.   On Plaquenil.     8.   Cirrhosis of liver prob sec hemochromatosis (+/- Methotrexate).   Ammonia nl.   DC Rifaxamin.  9.   Hepatic encephalopathy with hallucinations +/- superimposed Lewy Body dementia.  10.   DNR.  11.   SCDs for DVT prophylaxis.  12.   Hypomagnesemia, replace.  13.   HTN, meds on hold.      Will eventually need rehab.         Subjective:      Review of Systems:  ROS    Objective:   Physical Exam: Physical Exam  Cardiovascular:      Rate and Rhythm: Normal rate. Rhythm irregular.   Pulmonary:      Breath sounds: Normal breath sounds.   Abdominal:      General: There is no distension.      Palpations: Abdomen is soft.      Comments: Hernia not distended.   Musculoskeletal:      Right lower leg: No edema.      Left lower leg: No edema.   Skin:     Comments: Large right flank/abdomen hematoma.   Neurological:      Mental Status: She is alert.       Visit Vitals  BP 120/68 (BP 1 Location: Right upper arm, BP Patient Position: Supine)   Pulse 92   Temp 98.2 ??F (36.8 ??C)   Resp 17   Ht 5\' 3"  (1.6 m)    Wt 71.7 kg (158 lb 1.1 oz)   SpO2 100%   BMI 28.00 kg/m??      O2 Device: None (Room air)    Temp (24hrs), Avg:98.1 ??F (36.7 ??C), Min:97.9 ??F (36.6 ??C), Max:98.2 ??F (36.8 ??C)    No intake/output data recorded.   11/30 1901 - 12/02 0700  In: 660 [P.O.:660]  Out: 900 [Urine:900]      Data Review:       24 Hour Results:  Recent Results (from the past 24 hour(s))   METABOLIC PANEL, BASIC    Collection Time: 02/14/21  5:52 AM   Result Value Ref Range    Potassium 3.8  3.5 - 5.1 mEq/L    Chloride 109 (H) 98 - 107 mEq/L    Sodium 140 136 - 145 mEq/L    CO2 23 20 - 31 mEq/L    Glucose 83 74 - 106 mg/dl    BUN 9 9 - 23 mg/dl    Creatinine 5.44 (L) 0.55 - 1.02 mg/dl    GFR est AA >92.0      GFR est non-AA >60      Calcium 8.1 (L) 8.7 - 10.4 mg/dl    Anion gap 8 5 - 15 mmol/L   CBC WITH AUTOMATED DIFF    Collection Time: 02/14/21  5:52 AM   Result Value Ref Range    WBC 9.7 4.0 - 11.0 1000/mm3    RBC 2.86 (L) 3.60 - 5.20 M/uL    HGB 8.7 (L) 13.0 - 17.2 gm/dl    HCT 10.0 (L) 71.2 - 50.0 %    MCV 96.2 80.0 - 98.0 fL    MCH 30.4 25.4 - 34.6 pg    MCHC 31.6 30.0 - 36.0 gm/dl    PLATELET 197 588 - 325 1000/mm3    MPV 10.3 (H) 6.0 - 10.0 fL    RDW-SD 54.5 (H) 36.4 - 46.3      NRBC 0 0 - 0      IMMATURE GRANULOCYTES 0.4 0.0 - 3.0 %    NEUTROPHILS 61.3 34 - 64 %    LYMPHOCYTES 23.7 (L) 28 - 48 %    MONOCYTES 12.1 1 - 13 %    EOSINOPHILS 1.7 0 - 5 %    BASOPHILS 0.8 0 - 3 %   MAGNESIUM    Collection Time: 02/14/21  5:52 AM   Result Value Ref Range    Magnesium 1.7 1.6 - 2.6 mg/dL       Problem List:  Problem List as of 02/14/2021 Date Reviewed: 02-18-2021            Codes Class Noted - Resolved    COVID-19 ICD-10-CM: U07.1  ICD-9-CM: 079.89  10/21/2020 - Present    Overview Signed 11/01/2020  1:27 PM by Einar Gip, MD     10/21/20 SARS CoV-2 detected             Stroke [facility]) ICD-10-CM: I63.9  ICD-9-CM: 434.91  02/18/21 - Present        CVA (cerebral vascular accident) [facility]) ICD-10-CM: I63.9  ICD-9-CM: 434.91  February 18, 2021 - Present         Atrial fibrillation (HCC) ICD-10-CM: I48.91  ICD-9-CM: 427.31  10/31/2020 - Present    Overview Addendum 11/06/2020 11:15 AM by Einar Gip, MD     Noted this AM (10/31/20)  Rate controlled (84 BPM)  No previously documented hx of AFIB  CHADS2VASc = 6  (08/27/17 CT Acute interhemispheric fissure subdural hemorrhage. Chronic small vessel ischemic change)  ECHO:  EF 75%< LV 25/44, S/PW 9/10, LAVI 28, Mild MR, Mod TR, RVSP 43             Other partial intestinal obstruction (HCC) ICD-10-CM: K56.690  ICD-9-CM: 560.89  10/31/2020 - Present    Overview Addendum 11/01/2020  1:28 PM by Einar Gip, MD     10/30/20 CT Small bowel obstruction secondary to presumed small bowel containing  umbilical hernia. Stranding within the hernia sac suggesting engorgement. No  pneumatosis.  11/01/20 SURG We will continue to follow the patient to ensure she does not develop worsening incarceration and bowel obstruction from the hernia.  Currently if we can delay the surgery she would likely benefit from decreased risk of postoperative complications             Metabolic encephalopathy ICD-10-CM: G93.41  ICD-9-CM: 348.31  10/21/2020 - Present        Contusion of left hip ICD-10-CM: X93.71IR  ICD-9-CM: 924.01  10/21/2020 - Present        Left elbow contusion ICD-10-CM: S50.02XA  ICD-9-CM: 923.11  10/21/2020 - Present        Multiple falls ICD-10-CM: R29.6  ICD-9-CM: V15.88  10/21/2020 - Present        Deep venous thrombosis (HCC) ICD-10-CM: I82.409  ICD-9-CM: 453.40  Unknown - Present        Subdural hematoma caused by concussion ICD-10-CM: S06.5XAA  ICD-9-CM: 852.29  08/27/2017 - Present        Subdural hematoma ICD-10-CM: S06.5XAA  ICD-9-CM: 432.1  08/27/2017 - Present       Medications reviewed  Current Facility-Administered Medications   Medication Dose Route Frequency    magnesium oxide (MAG-OX) tablet 400 mg  400 mg Oral ONCE    hydrOXYzine HCL (ATARAX) tablet 25 mg  25 mg Oral TID PRN    polyethylene glycol (MIRALAX) packet 17 g  17 g Oral  DAILY    0.9% sodium chloride infusion 250 mL  250 mL IntraVENous PRN    promethazine (PHENERGAN) 12.5 mg in NS 50 mL IVPB  12.5 mg IntraVENous Q6H PRN    0.9% sodium chloride infusion 250 mL  250 mL IntraVENous PRN    atorvastatin (LIPITOR) tablet 40 mg  40 mg Oral QHS    acetaminophen (TYLENOL) tablet 1,000 mg  1,000 mg Oral Q6H PRN    saline peripheral flush soln 10-20 mL  10-20 mL IntraVENous PRN    [Held by provider] apixaban (ELIQUIS) tablet 5 mg  5 mg Oral BID    albuterol-ipratropium (DUO-NEB) 2.5 MG-0.5 MG/3 ML  3 mL Nebulization Q4H PRN    [Held by provider] furosemide (LASIX) tablet 20 mg  20 mg Oral DAILY    hydrOXYchloroQUINE (PLAQUENIL) tablet 200 mg  200 mg Oral BID    [Held by provider] lactulose (CHRONULAC) 10 gram/15 mL solution 30 mL  20 g Oral BID    levothyroxine (SYNTHROID) tablet 125 mcg  125 mcg Oral ACB    [Held by provider] lisinopriL (PRINIVIL, ZESTRIL) tablet 10 mg  10 mg Oral DAILY    [Held by provider] metoprolol tartrate (LOPRESSOR) tablet 25 mg  25 mg Oral Q8H    [Held by provider] potassium chloride (K-DUR, KLOR-CON M20) SR tablet 40 mEq  40 mEq Oral DAILY       Care Plan discussed with: Patient/Family    Total time spent with patient: 30 minutes.    [name], MD  February 14, 2021

## 2021-02-15 LAB — CBC WITH AUTOMATED DIFF
BASOPHILS: 0.3 % (ref 0–3)
EOSINOPHILS: 0.9 % (ref 0–5)
HCT: 26 % — ABNORMAL LOW (ref 37.0–50.0)
HGB: 8.3 gm/dl — ABNORMAL LOW (ref 13.0–17.2)
IMMATURE GRANULOCYTES: 0.4 % (ref 0.0–3.0)
LYMPHOCYTES: 17.8 % — ABNORMAL LOW (ref 28–48)
MCH: 30.2 pg (ref 25.4–34.6)
MCHC: 31.9 gm/dl (ref 30.0–36.0)
MCV: 94.5 fL (ref 80.0–98.0)
MONOCYTES: 10.9 % (ref 1–13)
MPV: 9.9 fL (ref 6.0–10.0)
NEUTROPHILS: 69.7 % — ABNORMAL HIGH (ref 34–64)
NRBC: 0 (ref 0–0)
PLATELET: 269 10*3/uL (ref 140–450)
RBC: 2.75 M/uL — ABNORMAL LOW (ref 3.60–5.20)
RDW-SD: 53 — ABNORMAL HIGH (ref 36.4–46.3)
WBC: 10.7 10*3/uL (ref 4.0–11.0)

## 2021-02-15 LAB — GLUCOSE, POC: Glucose (POC): 159 mg/dL — ABNORMAL HIGH (ref 65–105)

## 2021-02-15 LAB — CBC WITH AUTO DIFFERENTIAL
Basophils %: 0.3 % (ref 0–3)
Eosinophils %: 0.9 % (ref 0–5)
Hematocrit: 26 % — ABNORMAL LOW (ref 37.0–50.0)
Hemoglobin: 8.3 gm/dl — ABNORMAL LOW (ref 13.0–17.2)
Immature Granulocytes: 0.4 % (ref 0.0–3.0)
Lymphocytes %: 17.8 % — ABNORMAL LOW (ref 28–48)
MCH: 30.2 pg (ref 25.4–34.6)
MCHC: 31.9 gm/dl (ref 30.0–36.0)
MCV: 94.5 fL (ref 80.0–98.0)
MPV: 9.9 fL (ref 6.0–10.0)
Monocytes %: 10.9 % (ref 1–13)
Neutrophils %: 69.7 % — ABNORMAL HIGH (ref 34–64)
Nucleated RBCs: 0 (ref 0–0)
Platelets: 269 10*3/uL (ref 140–450)
RBC: 2.75 M/uL — ABNORMAL LOW (ref 3.60–5.20)
RDW-SD: 53 — ABNORMAL HIGH (ref 36.4–46.3)
WBC: 10.7 10*3/uL (ref 4.0–11.0)

## 2021-02-15 LAB — POCT GLUCOSE: POC Glucose: 159 mg/dL — ABNORMAL HIGH (ref 65–105)

## 2021-02-15 MED FILL — HYDROXYCHLOROQUINE 200 MG TAB: 200 mg | ORAL | Qty: 1

## 2021-02-15 MED FILL — LEVOTHYROXINE 125 MCG TAB: 125 mcg | ORAL | Qty: 1

## 2021-02-15 MED FILL — ATORVASTATIN 40 MG TAB: 40 mg | ORAL | Qty: 1

## 2021-02-15 MED FILL — POLYETHYLENE GLYCOL 3350 17 GRAM (100 %) ORAL POWDER PACKET: 17 gram | ORAL | Qty: 1

## 2021-02-15 NOTE — Progress Notes (Signed)
Progress Notes by Drusilla Kanner, MD at 02/15/21 [id number]                Author: Drusilla Kanner, MD  Service: Internal Medicine  Author Type: Physician       Filed: 02/15/21 1010  Date of Service: 02/15/21 0817  Status: Signed          Editor: Drusilla Kanner, MD (Physician)               Progress Note by Dr.Jaclene Bartelt      Patient: [name] [name]               Sex: female           DOA: 02/06/2021         Date of Birth:  10-Dec-1933      Age:   87 y.o.        LOS:  LOS: 9 days       MRN: [id number]                    CSN:  [id number]              SOAP:                                                                                    Subjective:   Symptoms:  Stable.  She reports weakness.  No shortness of breath.     Diet:  Adequate intake.     Activity level: Impaired due to weakness.     Pain:  She reports no pain.          Review of Systems    Respiratory:  Negative for shortness of breath.     Neurological:  Positive for weakness.          Patient Vitals for the past 24 hrs:     Temp  Pulse  Resp  BP  SpO2      02/15/21 0811  98.2 ??F (36.8 ??C)  86  18  121/71  98 %      02/15/21 0532  98.4 ??F (36.9 ??C)  93  17  125/74  96 %      02/14/21 2354  99.3 ??F (37.4 ??C)  98  17  136/71  97 %      02/14/21 2108  98.4 ??F (36.9 ??C)  97  17  122/71  98 %      02/14/21 1503  99.1 ??F (37.3 ??C)  90  17  136/69  98 %      02/14/21 1158  98.6 ??F (37 ??C)  81  17  133/74  99 %           Oxygen Therapy:   Oxygen Therapy   O2 Sat (%): 98 % (02/15/21 0811)   Pulse via Oximetry: 68 beats per minute (02/08/21 1000)   O2 Device: None (Room air) (02/14/21 0945)   Date  02/14/21 0700 - 02/15/21 0659  02/15/21 0700 - 02/16/21 0659      Shift  0700-1859  1900-0659  24 Hour Total  0700-1859  1900-0659  24 Hour Total  INTAKE      Shift Total(mL/kg)                  OUTPUT      Stool                    Stool Occurrence(s)    1 x  1 x            Shift Total(mL/kg)                  NET                  Weight (kg)  71.7   71.7  71.7  71.7  71.7  71.7           Objective:   General Appearance:  Comfortable.     Vital signs: (most recent): Blood pressure 121/71, pulse 86, temperature 98.2 ??F (36.8 ??C), resp. rate 18, height '5\' 3"'$  (1.6 m), weight 71.7 kg (158 lb 1.1 oz), SpO2 98 %.  Vital signs are  normal.     Output: Producing urine and producing stool.     Lungs:  Normal effort and normal respiratory rate.  Breath sounds clear to auscultation.     Heart: Normal rate.  Regular rhythm.  S1 normal and S2 normal.     Chest: Asymmetric chest wall expansion.    Abdomen: Abdomen is soft.     Extremities: Normal range of motion.     Neurological: Patient is alert.     Pupils:  Pupils are equal, round, and reactive to light.        Except presence of ecchymosis over the right side and flank   Current Medications:              Diagnostic:           Recent Labs         02/15/21   0437  02/14/21   1503  02/14/21   0552  02/13/21   0538      RBC  2.75*  3.15*  2.86*  2.54*      WBC  10.7  10.0  9.7  8.2      HGB  8.3*  9.6*  8.7*  7.7*      HCT  26.0*  30.3*  27.5*  24.2*      PLT  269  262  234  198      NA   --    --   140  140      K   --    --   3.8  3.8      CL   --    --   109*  109*      CO2   --    --   23  23      BUN   --    --   9  11      CREA   --    --   0.47*  0.47*      GLU   --    --   83  100      CA   --    --   8.1*  7.9*              No results for input(s): PH, PCO2, PO2 in the last 72 hours.   No results for input(s): CPK, CKNDX, TROIQ in the last 72  hours.      No lab exists for component: CPKMB   No results found for: PH, PHI, PCO2, PCO2I, PO2, PO2I, HCO3, HCO3I, FIO2, FIO2I   Lab Results      Component  Value  Date/Time        Color  YELLOW  10/31/2020 01:00 AM        Appearance  CLEAR  10/31/2020 01:00 AM        Specific gravity  1.020  10/31/2020 01:00 AM        pH (UA)  6.0  10/31/2020 01:00 AM        Protein  NEGATIVE  10/31/2020 01:00 AM        Glucose  NEGATIVE  10/31/2020 01:00 AM        Ketone  NEGATIVE   10/31/2020 01:00 AM        Bilirubin  NEGATIVE  10/31/2020 01:00 AM        Urobilinogen  0.2  10/31/2020 01:00 AM        Nitrites  NEGATIVE  10/31/2020 01:00 AM        Leukocyte Esterase  NEGATIVE  10/31/2020 01:00 AM        Bacteria  OCCASIONAL  09/06/2020 03:26 PM        WBC  OCCASIONAL  10/27/2020 04:19 PM        RBC  OCCASIONAL  10/27/2020 04:19 PM           No image results found.      Lab Results      Component  Value  Date/Time        ALT (SGPT)  8 (L)  02/09/2021 03:36 AM        AST (SGOT)  15.0  02/09/2021 03:36 AM        Alk. phosphatase  82  02/09/2021 03:36 AM        Bilirubin, total  0.80  02/09/2021 03:36 AM           All Micro Results              None                      No components found for: TROP   Echo Results  (Last 48 hours)             None                         Coag   No results for input(s): PTP, INR, APTT, INREXT in the last 72 hours.      Hepatic Function   No results for input(s): TBILI, CBIL, ALT, AP, TP, ALB, GLOB, AGRAT in the last 72 hours.      No lab exists for component: SGOT      Recent Glucose Results:    Lab Results      Component  Value  Date/Time        GLUCPOC  159 (H)  02/14/2021 11:55 PM              Last Point of Care HGB A1C   No results found for: HBA1CPOC      XR CHEST SNGL V      Result Date: 02/06/2021   EXAM: XR CHEST SNGL V INDICATION: Stroke  COMPARISON: 11/08/2020 WORKSTATION ID: [id number] TECHNIQUE: Frontal view. FINDINGS: SUPPORT DEVICES: None. LUNGS/PLEURA: No consolidation or pleural effusion. Bilateral chronic reticular lung markings are  noted.  HEART/MEDIASTINUM: Stable cardiomegaly. OTHER: None.       IMPRESSION: No acute cardiopulmonary process. Electronically signed by: Patsy Lager, MD 02/06/2021 5:52 AM EST       XR ABD (KUB)      Result Date: 02/06/2021   Exam: XR ABD (KUB) Indication: dobhoff placement Additional information: None. COMPARISON: None       FINDINGS/IMPRESSION: Small bowel feeding tube tip resides in the distal esophagus. Large  amount of tubing coils in the upper esophagus. Recommend repositioning. Electronically signed by: Cecilio Asper, MD 02/06/2021 5:16 PM EST       MRI BRAIN WO CONT      Result Date: 02/07/2021   EXAMINATION: MRI brain without contrast MEDICAL HISTORY: Left-sided weakness TECHNIQUE: Multisequence, multiplanar MRI of the brain without contrast. COMPARISONS: 02/06/2021 WORKSTATION ID: [id number] FINDINGS: Small focus of acute infarct involving  the right caudate body, extending to the posterior right basal ganglia, roughly measuring 2.6 x 2.0 cm in transaxial dimension. No hemorrhage or hemorrhagic conversion. A few scattered tiny foci of cortical and subcortical acute ischemia involving the  right cerebral hemisphere (MCA distribution), most consistent with tiny thromboemboli. A tiny focus (7 mm) of acute infarct involving the right anterior frontal cingulate gyrus and the right paramedian posterior frontal-parietal lobe (ACA distribution).  Query additional tiny focus of restricted diffusion involving the left occipital lobe versus artifact. No focal mass, midline shift or acute intracranial hemorrhage. Scattered subcortical and confluent periventricular T2 and FLAIR white matter hyperintensities,  nonspecific, though most consistent with prominent chronic small vessel ischemic change. Prominence of the ventricles, cisterns and other CSF containing spaces due to parenchymal volume loss. Flow voids within the skull base grossly unremarkable. Mild  mucosal thickening of the inferior maxillary sinuses.       IMPRESSION: 1. Acute infarct involving the right caudate body, extending to the posterior right basal ganglia. No hemorrhage or hemorrhagic conversion. 2. Multiple tiny foci of acute infarct scattered throughout the right MCA distribution, most consistent  with thromboembolic phenomenon. 3. A few tiny foci of ischemia involving the right ACA distribution, including a 7 mm focus involving the anterior right single  gyrus. 4. Diffuse parenchymal volume loss and chronic small vessel ischemic change. Electronically  signed by: Elnita Maxwell, MD 02/07/2021 4:26 PM EST       CT HEAD WO CONT      Result Date: 02/06/2021   EXAM:  CT HEAD WO CONT HISTORY: L sided weakness COMPARISON: 10/25/2020 WORKSTATION ID: [id number] TECHNIQUE: Multiplanar CT images of the head were obtained without contrast. All CT exams at this facility use one or more dose reduction techniques including  automatic exposure control, mA/kV adjustment per patient's size, or iterative reconstruction technique. FINDINGS: No acute intracranial hemorrhage. No mass effect or evidence of acute infarction. Mild diffuse parenchymal volume loss. No hydrocephalus.  There is subcortical and periventricular hypodensity compatible with gliosis due to nonacute ischemic change. Orbits are unremarkable.  Paranasal sinuses and mastoid air cells are unremarkable. Calvarium intact. Aspect score: 10       IMPRESSION: No acute intracranial abnormality. Electronically signed by: Patsy Lager, MD 02/06/2021 4:38 AM EST       CTA HEAD      Result Date: 02/06/2021   CTA HEAD, CTA NECK INDICATION: headache. Status post recent right MCA thrombectomy. COMPARISON: CTA head/neck same day TECHNIQUE: CTA of the head and neck was performed with  intravenous contrast. Multiplanar two-dimensional and maximum intensity projection  reformats performed and reviewed. To expedite  the results of stroke protocol patients, volumetric 3-D imaging was not included at the time of dictation. All CT exams at this facility use one or more dose reduction techniques including automatic exposure  control, mA/kV adjustment per patient's size, or iterative reconstruction technique. CAROTID STENOSIS REFERENCE USING NASCET CRITERIA % Stenosis = (1 - narrowest diameter/diameter of distal artery) x100 Mild: < 50% stenosis Moderate: 50-69% stenosis.  Severe: 70-94% stenosis. Near occlusion: 95-99% stenosis.  Occluded: 100% stenosis FINDINGS: Head Vasculature: There has been interval thrombectomy of the right MCA. The right MCA currently remains patent and perfuses the right MCA territory as expected,  grossly symmetrical compared to the left MCA territory. Left MCA remains patent. Anterior and posterior cerebral arteries remain patent. Vertebrobasilar system is patent. No discrete aneurysm or AVM. Neck Vasculature: There is no hemodynamically significant  stenosis within the carotid or vertebral arteries. There is no evidence of dissection. Brain: No findings to suggest acute territorial infarction. No parenchymal hemorrhage. Mild generalized cerebral atrophy and periventricular chronic microvascular changes.  Other: Some small volume gas within several venous structures involving the right face and neck, likely secondary to recent procedure. Upper lungs: Trace bilateral pleural effusions. Ascending aortic ectasia measuring up to 4.5 cm.       IMPRESSION: 1.  Status post right MCA thrombectomy. Right MCA currently remains widely patent. 2.  No large vessel occlusion in the head or neck. No evidence of dissection. 3.  No parenchymal hemorrhage or evidence of large territorial infarction. Electronically  signed by: Cecilio Asper, MD 02/06/2021 3:22 PM EST       CTA HEAD      Result Date: 02/06/2021   EXAMINATION: CTA NECK, CTA HEAD INDICATION: L sided weakness COMPARISON: No comparison available. TECHNIQUE: CTA Head and Neck with IV contrast. Sagittal and coronal reconstructions. 3D-MIP imaging was performed. Stenosis calculated using NASCET criteria.  Reference per NASCET criteria for degree of stenosis: MILD: less than 50% stenosis. MODERATE: 50-69% stenosis. SEVERE: 70-94% stenosis. NEAR OCCLUSION: 95-99% stenosis. Conventional, CT, or MR angiographic measurements of internal carotid artery (ICA)  stenoses are based on a ratio of artery diameters, with the ICA stenosis as the numerator and the distal ICA, just beyond  the stenosis, as the denominator. All CT exams at this facility use one or more dose reduction techniques including automatic exposure  control, mA/kV adjustment per patient's size, or iterative reconstruction technique. WORKSTATION ID: [id number] FINDINGS: Head Vasculature: No AVM, aneurysm or dissection. There is focal severe stenosis of the right distal M1 MCA with marked decreased  enhancement of the MCA vessels distal to the region of stenosis compared to the contralateral side. Neck Vasculature: No dissection or hemodynamically significant stenosis. Brain: No enhancing brain lesion. Neck Soft Tissues: No significant lymph node  enlargement. Other: Imaged lung apices are unremarkable.       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 Findings communicated by phone on 02/06/2021 4:44 AM EST Electronically signed by: Patsy Lager, MD 02/06/2021 4:46 AM EST       CTA NECK      Result Date: 02/06/2021   CTA HEAD, CTA NECK INDICATION: headache. Status post recent right MCA thrombectomy. COMPARISON: CTA head/neck same day TECHNIQUE: CTA of the head and neck was performed with  intravenous contrast. Multiplanar two-dimensional and maximum intensity projection  reformats performed and reviewed. To expedite the results of stroke protocol patients, volumetric 3-D imaging was not included at the time of dictation. All CT exams at this facility use  one or more dose reduction techniques including automatic exposure  control, mA/kV adjustment per patient's size, or iterative reconstruction technique. CAROTID STENOSIS REFERENCE USING NASCET CRITERIA % Stenosis = (1 - narrowest diameter/diameter of distal artery) x100 Mild: < 50% stenosis Moderate: 50-69% stenosis.  Severe: 70-94% stenosis. Near occlusion: 95-99% stenosis. Occluded: 100% stenosis FINDINGS: Head Vasculature: There has been interval thrombectomy of the right MCA. The right MCA currently remains patent and perfuses the right MCA territory as  expected,  grossly symmetrical compared to the left MCA territory. Left MCA remains patent. Anterior and posterior cerebral arteries remain patent. Vertebrobasilar system is patent. No discrete aneurysm or AVM. Neck Vasculature: There is no hemodynamically significant  stenosis within the carotid or vertebral arteries. There is no evidence of dissection. Brain: No findings to suggest acute territorial infarction. No parenchymal hemorrhage. Mild generalized cerebral atrophy and periventricular chronic microvascular changes.  Other: Some small volume gas within several venous structures involving the right face and neck, likely secondary to recent procedure. Upper lungs: Trace bilateral pleural effusions. Ascending aortic ectasia measuring up to 4.5 cm.       IMPRESSION: 1.  Status post right MCA thrombectomy. Right MCA currently remains widely patent. 2.  No large vessel occlusion in the head or neck. No evidence of dissection. 3.  No parenchymal hemorrhage or evidence of large territorial infarction. Electronically  signed by: Cecilio Asper, MD 02/06/2021 3:22 PM EST       CTA NECK      Result Date: 02/06/2021   EXAMINATION: CTA NECK, CTA HEAD INDICATION: L sided weakness COMPARISON: No comparison available. TECHNIQUE: CTA Head and Neck with IV contrast. Sagittal and coronal reconstructions. 3D-MIP imaging was performed. Stenosis calculated using NASCET criteria.  Reference per NASCET criteria for degree of stenosis: MILD: less than 50% stenosis. MODERATE: 50-69% stenosis. SEVERE: 70-94% stenosis. NEAR OCCLUSION: 95-99% stenosis. Conventional, CT, or MR angiographic measurements of internal carotid artery (ICA)  stenoses are based on a ratio of artery diameters, with the ICA stenosis as the numerator and the distal ICA, just beyond the stenosis, as the denominator. All CT exams at this facility use one or more dose reduction techniques including automatic exposure  control, mA/kV adjustment per patient's size, or  iterative reconstruction technique. WORKSTATION ID: [id number] FINDINGS: Head Vasculature: No AVM, aneurysm or dissection. There is focal severe stenosis of the right distal M1 MCA with marked decreased  enhancement of the MCA vessels distal to the region of stenosis compared to the contralateral side. Neck Vasculature: No dissection or hemodynamically significant stenosis. Brain: No enhancing brain lesion. Neck Soft Tissues: No significant lymph node  enlargement. Other: Imaged lung apices are unremarkable.       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 Findings communicated by phone on 02/06/2021 4:44 AM EST Electronically signed by: Patsy Lager, MD 02/06/2021 4:46 AM EST       CT ABD PELV W CONT      Result Date: 02/11/2021   CT ABD PELV W CONT INDICATION:  Anemia.   Hematoma. COMPARISON: CT abdomen pelvis 10/30/2020 TECHNIQUE: CT of the abdomen/pelvis was performed with intravenous contrast with coronal and sagittal reformatted images.  Multiplanar projection reconstructions  were performed and reviewed. All CT exams at this facility use one or more dose reduction techniques including automatic exposure control, mA/kV adjustment per patient's size, or iterative reconstruction technique. FINDINGS: LOWER THORAX: Small bilateral  pleural effusions, right greater than left. LIVER: Subcentimeter hypodensity inferior segment IVb, statistically a cyst.  No suspicious liver lesions. BILIARY: Negative SPLEEN: Negative ADRENALS: Negative PANCREAS: Negative RENAL: Subcentimeter renal hypodensities  too small to characterize, likely cysts. These do not meet criteria for follow-up. No hydronephrosis or renal calculi. BLADDER: Bladder is mostly obscured from beam hardening artifact from bilateral hip arthroplasties. PELVIS: Obscured from metallic artifact.  GI TRACT: No bowel obstruction or discrete active inflammation. Small umbilical hernia containing nonobstructed loop of small bowel. VASCULATURE: No  evidence of aneurysm. Aortoiliac atherosclerosis LYMPH NODES: No abdominal or pelvic lymph nodes measuring  greater than 1 cm in short axis. PERITONEUM: There is no free air or free fluid. BONES: No acute osseous abnormalities. Unchanged total hip arthroplasties,. Intact. Unchanged plate and cerclage wires around the proximal left femur. Chronic heterogeneous  lobulated lesion anterior to the sacrum measuring 4.4 x 5.2 x 4.7 cm, nonspecific. BODY WALL: Large hematoma involving the right abdominal wall measuring 12 x 17 cm in axial dimension and at least 24 cm in craniocaudal dimension. Mass effect on surrounding  structures.       IMPRESSION: 1.  Large right abdominal wall hematoma measuring 12 x 17 x 24 cm. Mild mass effect on surrounding structures. 2.  Small bilateral pleural effusions, right greater than left 3.  Chronic lobulated lesion anterior to the lower sacrum, nonspecific.  This could represent extraosseous hematopoiesis. Other etiologies cannot be excluded at this time. Recommend nonemergent dedicated imaging such as MRI sacrum/coccyx, to include in and out of phase sequences. 4.  Small umbilical hernia containing nonobstructive  loop of small bowel. Electronically signed by: Cecilio Asper, MD 02/11/2021 11:33 AM EST       CT PERF W CBF      Result Date: 02/06/2021   EXAMINATION: CT PERF W CBF INDICATION: L sided weakness COMPARISON: 10/25/2020 TECHNIQUE: CT perfusion with cerebral blood flow. Maps of cerebral blood flow, cerebral blood volume, and mean transit time were generated using perfusion source data. All CT  exams at this facility use one or more dose reduction techniques including automatic exposure control, mA/kV adjustment per patient's size, or iterative reconstruction technique. WORKSTATION ID: [id number] FINDINGS: Increased mean transit time and decreased  cerebral blood flow with mildly decreased blood volume in the right MCA distribution suggestive of acute infarct and ischemia  penumbra.       IMPRESSION: Perfusion mismatch in the right MCA distribution suggestive of acute infarct and ischemic penumbra. Findings communicated by phone on 02/06/2021 4:42 AM EST Electronically signed by: Patsy Lager, MD 02/06/2021 4:44 AM EST       DUPLEX LOW EXT ARTERY RIGHT      Result Date: 02/11/2021   [id number]                                                           Study ID: [id number]                                        [facility]                                            [address] [place],                                         [place]                   Lower Extremity Arterial Duplex Report Name: ATAVIA, POPPE Date: 02/11/2021 01:01 PM MRN: [id number]             Patient Location: [id number] DOB: 1934/01/08         Age: 41 yrs  Gender: Female          Account #: [id number] Reason For Study: Right groin pain. Recent right groin access. ICD-10 K02.542 Ordering Physician: Mikey Kirschner Referring Physician: Colen Darling Performed By: Leane Platt, RVT Interpretation  Summary 1. No evidence of pseudoaneurysm or arteriovenous fistula. 2. No evidence of thrombus noted in the common femoral or proximal femoral veins. 3. A hematoma was visualized in the proximal groin region measuring 12 cm x 15 cm. ___________________________________________________________________________  QUALITY/PROCEDURE Lower Extremity Arterial Duplex Limited (70623). RIGHT LEG A duplex scan of the arteries and veins in the right groin region was performed. There was no evidence of a pseudoaneurysm or an arteriovenous fistula noted in the right groin  region. The common femoral, superficial femoral and  deep femoral arteries are multiphasic. The common femoral vein and proximal femoral vein were compressible with no evidence of intraluminal thrombus. A hematoma was visualize in the proximal groin region  measuring 12 cm x 15cm with heterogenous echos. Electronically signed byDR Wenda Low, MD   02/11/2021 05:48 PM       NEUROVASCULAR PROCEDURE      Result Date: 02/10/2021   Table formatting from the original result was not included. NEUROSURGERY OPERATIVE REPORT   Patient: [name] [name] MRN: [id number]  SSN: [id number]  Date of Birth: [birth year]  Age: 87 y.o.  Sex: female              Surgeon: Alpha Gula, MD   Date of Procedure:  02/06/2021   Preoperative Diagnosis: Right M1 occlusion   Postoperative Diagnosis: Same   Procedure: 1. Right common femoral arteriogram (GROIN ACCESS: 9:30AM) 2. Right innominate arteriogram 3.  Right internal carotid arteriogram 4. Mechanical thrombectomy  with Trevo stent retriever and mechanical aspiration (FIRST PASS 11:07AM) 5. Right internal carotid arteriogram (TICI REPERFUSION: 11:12AM)   Indications: 87 y.o. female with LKN 8:30PM last night. This AM, she woke up with L sided weakness and dysarthria.  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 No IV-TPA was given. She has a history of atrial fibrillation, but not on anticoagulation because of risk of falls. Lives in assisted living.  The risk, benefits,  alternatives were clearly discussed with her son and power of attorney, and she would elected to have her undergo the above-stated procedure.   Procedure in Detail: After consent, the patient was brought to the neurovascular suite.  Anesthesia was induced.   Lines were placed.  The patient was positioned supine with all pressure points well-padded.  The right groin was shaved, cleaned, and prepped in standard sterile fashion.  Timeout was performed.  Systolic blood pressure goals were kept 140-180 throughout  the case.  Lidocaine was injected.  The right common femoral artery was accessed using a micropuncture kit and an 8 [place] short ACT sheath was placed.  A connector tubing was given to anesthesia to transduce blood pressure throughout the case. RAO angiogram  revealed antegrade flow into the right common femoral artery with access point above the bifurcation, with no atherosclerosis or dissection, amenable to use of a closure device.  Heparin 2000 units was administered IV.    A 088 penumbra catheter was  advanced over a 5 [place] select catheter and 035 Glidewire across the aortic arch into the right innominate artery.  Based on the tortuosity of the aortic arch as well as the innominate artery, the 60F select catheter was removed.  The decision was made  to remove the catheters and obtain more support. The A line and blood pressure cuff were correlated. The 8 French short sheath was exchanged for an Arrow 58F medium sheath catheter.  The 088 penumbra catheter was advanced over a Simmons 2 catheter and  Glidewire advantage across the aortic arch and into innominate artery and the innominate artery.  Multiple attempts were made to access the innominate artery and right common carotid artery and right internal carotid artery with much difficulty. Attempts  were made to catheterize the arch and innominate artery with the Glidewire advantage and 035 Glidewire.  After pulling out the Glidewire advantage, there was evidence of a significant amount of clot in the [facility] catheter.  This occurred on two separate  occasions. The Simmons catheter had to be removed twice based on the degree of clot that was found in the catheter.  Additional 1000 units of heparin was administered.   After multiple attempts, the Arrow sheath and 088 catheter were parked on the proximal  curve of the arch, at the take-off of the innominate artery. The [facility] catheter was removed. AP and lateral angiograms of the right innominate artery provided a roadmap  for further catheterization into the R ICA. There was no evidence of significant  carotid stenosis.  Once the there was sufficient support, the 035 Glidewire was advanced into the right internal carotid artery and the 5 [place] select catheter followed into the proximal right cervical segment of the ICA.  The Stuart was removed.   AP and lateral angiograms of the right internal carotid artery revealed antegrade flow into the ICA, ACA, M1 segment, with evidence of a right M1 occlusion.  Next, a phenom 021 microcatheter was advanced over a Synchro 2 standard microwire into the  right ICA and right MCA.  The microwire was removed.  Next a Trevo 4 x 40 stent retriever was advanced, centered on the focal area of occlusion and deployed under fluoroscopic guidance.  The microcatheter was removed.  After approximately 5 minutes, the  stent retriever was removed while the 48F select catheter was placed under aspiration.  As the stent was removed, the 5 [place] select catheter and the 088 penumbra catheter advanced further into the cervical segment of the ICA.  There was a significant  amount of thick dark clot noted in the stent retriever.  Post mechanical thrombectomy angiogram of the right ICA revealed antegrade flow into the ICA, ACA, MCA, with TICI 3 reperfusion.  The catheters were withdrawn.  Hemostasis was obtained using a 6  / 7 Mynx and manual compression.        : 1. Right M1 occlusion s/p mechanical thrombectomy with Trevo stent retriever with TICI 3 reperfusion.           Telemetry: rr      Current Facility-Administered Medications      Medication      ?  hydrOXYzine HCL (ATARAX) tablet 25 mg      ?  polyethylene glycol (MIRALAX) packet 17 g      ?  0.9% sodium chloride infusion 250 mL      ?  promethazine (PHENERGAN) 12.5 mg in NS 50 mL IVPB      ?  0.9% sodium chloride infusion 250 mL      ?  atorvastatin (LIPITOR) tablet 40 mg      ?  acetaminophen (TYLENOL) tablet 1,000 mg      ?  saline peripheral  flush soln 10-20 mL      ?  [Held by provider] apixaban (ELIQUIS) tablet 5 mg      ?  albuterol-ipratropium (DUO-NEB) 2.5 MG-0.5 MG/3 ML      ?  [Held by provider] furosemide (LASIX) tablet 20 mg      ?  hydrOXYchloroQUINE (PLAQUENIL) tablet 200 mg      ?  [Held by provider] lactulose (CHRONULAC) 10 gram/15 mL solution 30 mL      ?  levothyroxine (SYNTHROID) tablet 125 mcg      ?  [Held by provider] lisinopriL (PRINIVIL, ZESTRIL) tablet 10 mg      ?  [Held by provider] metoprolol tartrate (LOPRESSOR) tablet 25 mg      ?  [Held by provider] potassium chloride (K-DUR, KLOR-CON M20) SR tablet 40 mEq              Impression :              Acute right M1 stroke status post thrombectomy   Left-sided weakness improved   Systemic hypertension   History of atrial fibrillation   Status post Dobbhoff placement                  1.  Continue current treatment   2.  Physical therapy   3.           Risk of deterioration: Moderate           Total time spent : Total time spent today on the patient was 32 minutes which consisted of: Preparing to see  the patient, reviewing noted diagnostic studies and all interval lab data, obtaining//reviewing history,  performing exam, counseling/educating the patient, independently interpreting results  and communicating results to the patient, ordering follow-up  tests, documentation and care coordination.  This is exclusive of any other services or procedures provided              Care Plan discussed with: Patient, Nursing Staff, and >50% of time spent in counseling and coordination of care           Discussed:  Care Plan and D/C Planning   Prophylaxis:  DVT and Pressure ulcers   Disposition:  Home w/Family   Reviewed: Medications, allergies, clinical lab test results and imaging results have been reviewed. Any abnormal findings have been addressed.    Dragon medical dictation software was used for portions of this report. Unintended grammatical errors may occur.                            Past  Medical/Surgical History:          Past Medical History:        Diagnosis  Date         ?  Arthritis       ?  Deep venous thrombosis ([place])       ?  Endocrine disease            hypothyroid         ?  Hemochromatosis           ?  Hypertension            Past Surgical History:         Procedure  Laterality  Date          ?  HX ORTHOPAEDIC              left knee replacement          Social History:          Social History          Socioeconomic History         ?  Marital status:  WIDOWED       Tobacco Use         ?  Smoking status:  Never     ?  Smokeless tobacco:  Never       Vaping Use         ?  Vaping Use:  Never used       Substance and Sexual Activity         ?  Alcohol use:  No         ?  Drug use:  No          Family History:     History reviewed. No pertinent family history.     Allergies:          Allergies        Allergen  Reactions         ?  Shellfish Derived  Hives         ?  Sulfa (Sulfonamide Antibiotics)  Hives                   Admitting Diagnosis:          Patient Active Problem List        Diagnosis  Code         ?  Deep venous thrombosis (HCC)  I82.409     ?  Subdural hematoma caused by concussion  S06.5XAA     ?  Subdural hematoma  S06.5XAA     ?  Metabolic encephalopathy  J17.91     ?  Contusion of left hip  S70.02XA     ?  Left elbow contusion  S50.02XA     ?  Multiple falls  R29.6     ?  Atrial fibrillation ([place])  I48.91     ?  Other partial intestinal obstruction ([place])  K56.690     ?  COVID-19  U07.1     ?  Stroke [facility])  I63.9         ?  CVA (cerebral vascular accident) [facility])  I63.9              Drusilla Kanner, MD   February 15, 2021

## 2021-02-15 NOTE — Progress Notes (Signed)
OCCUPATIONAL THERAPY TREATMENT     Patient: [name] [name] (87 y.o. female)  Room: 6628/6628    Primary Diagnosis: Stroke [facility]) [I63.9]  CVA (cerebral vascular accident) (HCC) [I63.9]  Atrial fibrillation (HCC) [I48.91]   Procedure(s) (LRB):  Procedure Cancelled/Not Performed - ESOPHAGOGASTRODUODENOSCOPY (EGD) (N/A) 8 Days Post-Op  Date of Admission: 02/06/2021   Length of Stay:  9 day(s)  Insurance: Payor: [facility] / Plan: [facility] MEDICARE A & B / Product Type: Medicare /      Date: 02/15/2021  In time:  1057        Out time:  1151    Isolation:  There are currently no Active Isolations       MDRO: No active infections    Precautions: Falls, L sided weakness, SBP 100-140  Ordered weight bearing status: None    Current diet order: ADULT DIET Regular; Mildly Thick (Nectar)    Occupational Therapy Goals:   OT goals initiated 02/10/2021 and will be met by patient within 10 sessions      - Patient will perform Upper Body Dressing with minimum assistance and DME/AE PRN.   - Patient will perform all aspects of toileting with moderate assistance and DME/AE PRN.  - Patient will perform Upper Body Bathing with minimum assistance and DME/AE PRN.  - Patient will perform sit to stand in preparation for toileting with moderate assistance and DME PRN.  -  Patient will be Oriented x3 to promote maximal independence in basic ADLs.   - Patient will consistently follow multi-step commands (2 or more) to improve performance of ADL's with independence    ASSESSMENT:    Based on the objective data described below, the patient presents with       -pt cooperative and agreeable to session  -Pt son present during session, education provided for bed mobility, arm positioning while supine, arm care   -progressing with multi step commands for ADL performance while seated EOB  -Pt guards LUE and observed flexor synergy pattern while seated EOB   -decreased functional mobility  -decreased core stability  -left buttock skin tear noted approx  quarter sized with redness, primary nurse notified, bandage applied  -Right buttock skin tear noted no apparent redness, primary nurse aware  -deconditioned  -decreased flexibility in BLE  -Total A for bed mobility x 1 person. 2nd person on hand for safety   -large bruise noted on pt back- right side  -improving with static sitting, CGA required during BADL participation to maintain midline positioning  -edema in bilateral ankles/feet-socks removed, no pitting  - limited ROM in LUE-adduction and scapular mobilization only this date   -Tolerated EOB sit approx 20 mins     PLAN: Adaptive equipment, ADI training, activity tolerance, functional balance training, functional mobility training, therapeutic exercise, therapeutic activity, patient/caregiver education and training, neuromuscular re-education, energy conservation, and cognitive training.  Frequency/Duration: Patient to be seen 1-5x/week x 4 weeks.    Recommendations:  Recommend continued skilled occupational therapy intervention to address above impairments.  Recommend out of bed activity to counteract ill effects of bedrest, with assistance from staff as needed.    Discharge Recommendations: SNF  Equipment Recommendations for Discharge: TBD at time of d/c from SNF     Education/ communication:     Barriers to learning/limitations:  Yes;  physical  Education provided to: patient, son on (+) role of OT, (+) OT plan of care, (+) Instructed patient in the benefits of maintaining activity tolerance, functional mobility, and independence with  self care tasks during acute stay  to ensure safe return home and to baseline. Encouraged patient to increase frequency and duration OOB, be out of bed for all meals, perform daily ADLs (as approved by RN/MD regarding bathing etc), and performing functional mobility to/from bathroom with staff assistance as needed., (+) instructed patient on the importance of activity while hospitalized to prevent a decline in function, (+)  staff assistance with mobility, (+) change positions frequently, (+) discharge disposition/recommendations, (+) ADL training, (+) breathing exercises, (+) edema control, stroke education, signs/symptoms, arm positioning, bed mobility, hemi dressing tech  Educational handouts issued: none this session  Patient / family response to education: verbalized and demonstrated understanding, showing interest, trying to perform skills    SUBJECTIVE:     Patient "It took a couple of people last time." Referring to supine to sit    OBJECTIVE DATA SUMMARY:     Orders, labs, occupational therapy and chart reviewed on [name] [name]. Communicated with nursing staff. Patient cleared to participate in Occupational Therapy treatment.    Patient found: supine, HOB elevated, son present, bed alarm, telemetry (+), purewick(+)    Most recent value for oxygen in flow sheets:  O2 Device: None (Room air) (02/14/21 0945)        Pain assessment: 0/10 reported at rest, unable to provide numerical rating during movement, pt report discomfort "all the time when I have to move it." (LUE)    Cognitive:   Mental Status: Oriented to, person, place, situation, alert, awake, and pleasant.  Communication: grossly intact and slurred speech.  Attention span: Good  Follows commands: 1 and 2 step consistently  General Cognition: slow processing, delayed responses, impaired problem solving, and impaired ST Memory.  Hearing: grossly intact.  Vision:  grossly intact    Activities of Daily Living:  UB bathing: Seated EOB with Max A, pt required frequent body alignment to maintain midline, pt tends to lean left and posteriorly unable to self correct. Therapist washed back and applied lotion  UBD: Max A seated EOB, hemi dressing tech  LBD: doffing socks Total A supine  LB bathing: supine total A  Oral care: seated EOB with setup.  CGA  to maintain seated balance  Toileting: dependent: purewick and per pt report uses bedpan for BM secondary to unable to stand  since arrival  Mobility:  Bed mobility: rolling L and R Total A  Pt unable to maintain sidelying position without back support and BLE positioning with Total A  Supine to EOB sit: Max A x 1 person, 2nd person on hand for safety  EOB sit to supine: Max A x 1 person, 2nd person on hand for safety  HOB scoot: Total A    Activity Tolerance:   Good, pt able to tolerate sitting EOB approx 20 mins for bathing/grooming tasks    Balance:  Static sit: Poor  Dynamic sit: Unable to perform this date  Static stand: Unable to attempt this date  Dynamic stand: DNT    Therapeutic exercise: 1 set up to 10 reps  PROM to LUE scapular mobilization-in sidelying on right: shoulder adduction, levator scapulae stretch seated EOB    Final Location: sidelying on right side, pillow placed at back for support, pillow placed between BLE for comfort, RLE extended, LLE slightly flexed, son at bedside, bed alarm (+), call bell in sight/reach, all OT needs met, no c/o pain    Dimas Aguas, OTA  February 15, 2021

## 2021-02-16 LAB — CBC WITH AUTOMATED DIFF
BASOPHILS: 0.3 % (ref 0–3)
EOSINOPHILS: 1.1 % (ref 0–5)
HCT: 24.4 % — ABNORMAL LOW (ref 37.0–50.0)
HGB: 7.6 gm/dl — ABNORMAL LOW (ref 13.0–17.2)
IMMATURE GRANULOCYTES: 0.6 % (ref 0.0–3.0)
LYMPHOCYTES: 21.8 % — ABNORMAL LOW (ref 28–48)
MCH: 29.3 pg (ref 25.4–34.6)
MCHC: 31.1 gm/dl (ref 30.0–36.0)
MCV: 94.2 fL (ref 80.0–98.0)
MONOCYTES: 10.2 % (ref 1–13)
MPV: 9.9 fL (ref 6.0–10.0)
NEUTROPHILS: 66 % — ABNORMAL HIGH (ref 34–64)
NRBC: 0 (ref 0–0)
PLATELET: 280 10*3/uL (ref 140–450)
RBC: 2.59 M/uL — ABNORMAL LOW (ref 3.60–5.20)
RDW-SD: 53.1 — ABNORMAL HIGH (ref 36.4–46.3)
WBC: 10.8 10*3/uL (ref 4.0–11.0)

## 2021-02-16 LAB — CBC WITH AUTO DIFFERENTIAL
Basophils %: 0.3 % (ref 0–3)
Eosinophils %: 1.1 % (ref 0–5)
Hematocrit: 24.4 % — ABNORMAL LOW (ref 37.0–50.0)
Hemoglobin: 7.6 gm/dl — ABNORMAL LOW (ref 13.0–17.2)
Immature Granulocytes: 0.6 % (ref 0.0–3.0)
Lymphocytes %: 21.8 % — ABNORMAL LOW (ref 28–48)
MCH: 29.3 pg (ref 25.4–34.6)
MCHC: 31.1 gm/dl (ref 30.0–36.0)
MCV: 94.2 fL (ref 80.0–98.0)
MPV: 9.9 fL (ref 6.0–10.0)
Monocytes %: 10.2 % (ref 1–13)
Neutrophils %: 66 % — ABNORMAL HIGH (ref 34–64)
Nucleated RBCs: 0 (ref 0–0)
Platelets: 280 10*3/uL (ref 140–450)
RBC: 2.59 M/uL — ABNORMAL LOW (ref 3.60–5.20)
RDW-SD: 53.1 — ABNORMAL HIGH (ref 36.4–46.3)
WBC: 10.8 10*3/uL (ref 4.0–11.0)

## 2021-02-16 MED FILL — POLYETHYLENE GLYCOL 3350 17 GRAM (100 %) ORAL POWDER PACKET: 17 gram | ORAL | Qty: 1

## 2021-02-16 MED FILL — ATORVASTATIN 40 MG TAB: 40 mg | ORAL | Qty: 1

## 2021-02-16 MED FILL — ACETAMINOPHEN 500 MG TAB: 500 mg | ORAL | Qty: 2

## 2021-02-16 MED FILL — LEVOTHYROXINE 125 MCG TAB: 125 mcg | ORAL | Qty: 1

## 2021-02-16 MED FILL — HYDROXYCHLOROQUINE 200 MG TAB: 200 mg | ORAL | Qty: 1

## 2021-02-16 NOTE — Progress Notes (Signed)
Progress Notes by Drusilla Kanner, MD at 02/16/21 [phone]                Author: Drusilla Kanner, MD  Service: Internal Medicine  Author Type: Physician       Filed: 02/16/21 1043  Date of Service: 02/16/21 5462  Status: Signed          Editor: Drusilla Kanner, MD (Physician)               Progress Note by Dr.Akshath Mccarey      Patient: [name] [name]               Sex: female           DOA: 02/06/2021         Date of Birth:  10-15-1933      Age:   87 y.o.        LOS:  LOS: 10 days       MRN: [id number]                    CSN:  [id number]              SOAP:                                                                                    Subjective:   Symptoms:  Stable.  She reports weakness.  No shortness of breath.     Diet:  Adequate intake.     Activity level: Impaired due to weakness.     Pain:  She reports no pain.          Review of Systems    Respiratory:  Negative for shortness of breath.     Neurological:  Positive for weakness.          Patient Vitals for the past 24 hrs:     Temp  Pulse  Resp  BP  SpO2      02/16/21 0758  97.7 ??F (36.5 ??C)  87  18  110/60  97 %      02/16/21 0446  98.4 ??F (36.9 ??C)  90  17  121/71  96 %      02/16/21 0022  99.3 ??F (37.4 ??C)  91  17  135/67  98 %      02/15/21 2255  97.7 ??F (36.5 ??C)  88  17  120/70  99 %      02/15/21 1604  97.9 ??F (36.6 ??C)  92  17  132/74  97 %      02/15/21 1235  98.2 ??F (36.8 ??C)  86  17  132/78  99 %              Oxygen Therapy:   Oxygen Therapy   O2 Sat (%): 97 % (02/16/21 0758)   Pulse via Oximetry: 68 beats per minute (02/08/21 1000)   O2 Device: None (Room air) (02/14/21 0945)   Date  02/15/21 0700 - 02/16/21 0659  02/16/21 0700 - 02/17/21 0659      Shift  0700-1859  1900-0659  24 Hour Total  0700-1859  1900-0659  24  Hour Total      INTAKE      P.O.    457  457              P.O.    457  457            Shift Total(mL/kg)    457(6.4)  457(6.4)            OUTPUT      Urine(mL/kg/hr)    500(0.6)  500(0.3)              Urine Voided     500  500              Urine Occurrence(s)    2 x  2 x            Stool                    Stool Occurrence(s)    1 x  1 x            Shift Total(mL/kg)    500(7)  500(7)            NET    -43  -43            Weight (kg)  71.7  71.7  71.7  71.7  71.7  71.7              Objective:   General Appearance:  Comfortable.     Vital signs: (most recent): Blood pressure 110/60, pulse 87, temperature 97.7 ??F (36.5 ??C), resp. rate 18, height '5\' 3"'  (1.6 m), weight 71.7 kg (158 lb 1.1 oz), SpO2 97 %.  Vital signs are  normal.     Output: Producing urine and producing stool.     Lungs:  Normal effort and normal respiratory rate.  Breath sounds clear to auscultation.     Heart: Normal rate.  Regular rhythm.  S1 normal and S2 normal.     Chest: Asymmetric chest wall expansion.    Abdomen: Abdomen is soft.     Extremities: Normal range of motion.     Neurological: Patient is alert.     Pupils:  Pupils are equal, round, and reactive to light.        Except presence of ecchymosis over the right side and flank   Current Medications:              Diagnostic:           Recent Labs         02/16/21   0524  02/15/21   0437  02/14/21   1503  02/14/21   0552      RBC  2.59*  2.75*  3.15*  2.86*      WBC  10.8  10.7  10.0  9.7      HGB  7.6*  8.3*  9.6*  8.7*      HCT  24.4*  26.0*  30.3*  27.5*      PLT  280  269  262  234      NA   --    --    --   140      K   --    --    --   3.8      CL   --    --    --   109*      CO2   --    --    --  23      BUN   --    --    --   9      CREA   --    --    --   0.47*      GLU   --    --    --   83      CA   --    --    --   8.1*                 No results for input(s): PH, PCO2, PO2 in the last 72 hours.   No results for input(s): CPK, CKNDX, TROIQ in the last 72 hours.      No lab exists for component: CPKMB   No results found for: PH, PHI, PCO2, PCO2I, PO2, PO2I, HCO3, HCO3I, FIO2, FIO2I   Lab Results      Component  Value  Date/Time        Color  YELLOW  10/31/2020 01:00 AM        Appearance   CLEAR  10/31/2020 01:00 AM        Specific gravity  1.020  10/31/2020 01:00 AM        pH (UA)  6.0  10/31/2020 01:00 AM        Protein  NEGATIVE  10/31/2020 01:00 AM        Glucose  NEGATIVE  10/31/2020 01:00 AM        Ketone  NEGATIVE  10/31/2020 01:00 AM        Bilirubin  NEGATIVE  10/31/2020 01:00 AM        Urobilinogen  0.2  10/31/2020 01:00 AM        Nitrites  NEGATIVE  10/31/2020 01:00 AM        Leukocyte Esterase  NEGATIVE  10/31/2020 01:00 AM        Bacteria  OCCASIONAL  09/06/2020 03:26 PM        WBC  OCCASIONAL  10/27/2020 04:19 PM        RBC  OCCASIONAL  10/27/2020 04:19 PM           No image results found.      Lab Results      Component  Value  Date/Time        ALT (SGPT)  8 (L)  02/09/2021 03:36 AM        AST (SGOT)  15.0  02/09/2021 03:36 AM        Alk. phosphatase  82  02/09/2021 03:36 AM        Bilirubin, total  0.80  02/09/2021 03:36 AM           All Micro Results              None                      No components found for: TROP   Echo Results  (Last 48 hours)             None                         Coag   No results for input(s): PTP, INR, APTT, INREXT, INREXT in the last 72 hours.      Hepatic Function   No results for input(s): TBILI, CBIL, ALT, AP, TP, ALB, GLOB, AGRAT in the last 72 hours.  No lab exists for component: SGOT      Recent Glucose Results:    No results found for: GLU, GLUPOC, GLUCPOC         Last Point of Care HGB A1C   No results found for: HBA1CPOC      XR CHEST SNGL V      Result Date: 02/06/2021   EXAM: XR CHEST SNGL V INDICATION: Stroke  COMPARISON: 11/08/2020 WORKSTATION ID: [id number] TECHNIQUE: Frontal view. FINDINGS: SUPPORT DEVICES: None. LUNGS/PLEURA: No consolidation or pleural effusion. Bilateral chronic reticular lung markings are noted.  HEART/MEDIASTINUM: Stable cardiomegaly. OTHER: None.       IMPRESSION: No acute cardiopulmonary process. Electronically signed by: Patsy Lager, MD 02/06/2021 5:52 AM EST       XR ABD (KUB)      Result Date: 02/06/2021    Exam: XR ABD (KUB) Indication: dobhoff placement Additional information: None. COMPARISON: None       FINDINGS/IMPRESSION: Small bowel feeding tube tip resides in the distal esophagus. Large amount of tubing coils in the upper esophagus. Recommend repositioning. Electronically signed by: Cecilio Asper, MD 02/06/2021 5:16 PM EST       MRI BRAIN WO CONT      Result Date: 02/07/2021   EXAMINATION: MRI brain without contrast MEDICAL HISTORY: Left-sided weakness TECHNIQUE: Multisequence, multiplanar MRI of the brain without contrast. COMPARISONS: 02/06/2021 WORKSTATION ID: [id number] FINDINGS: Small focus of acute infarct involving  the right caudate body, extending to the posterior right basal ganglia, roughly measuring 2.6 x 2.0 cm in transaxial dimension. No hemorrhage or hemorrhagic conversion. A few scattered tiny foci of cortical and subcortical acute ischemia involving the  right cerebral hemisphere (MCA distribution), most consistent with tiny thromboemboli. A tiny focus (7 mm) of acute infarct involving the right anterior frontal cingulate gyrus and the right paramedian posterior frontal-parietal lobe (ACA distribution).  Query additional tiny focus of restricted diffusion involving the left occipital lobe versus artifact. No focal mass, midline shift or acute intracranial hemorrhage. Scattered subcortical and confluent periventricular T2 and FLAIR white matter hyperintensities,  nonspecific, though most consistent with prominent chronic small vessel ischemic change. Prominence of the ventricles, cisterns and other CSF containing spaces due to parenchymal volume loss. Flow voids within the skull base grossly unremarkable. Mild  mucosal thickening of the inferior maxillary sinuses.       IMPRESSION: 1. Acute infarct involving the right caudate body, extending to the posterior right basal ganglia. No hemorrhage or hemorrhagic conversion. 2. Multiple tiny foci of acute infarct scattered throughout the right MCA  distribution, most consistent  with thromboembolic phenomenon. 3. A few tiny foci of ischemia involving the right ACA distribution, including a 7 mm focus involving the anterior right single gyrus. 4. Diffuse parenchymal volume loss and chronic small vessel ischemic change. Electronically  signed by: Elnita Maxwell, MD 02/07/2021 4:26 PM EST       CT HEAD WO CONT      Result Date: 02/06/2021   EXAM:  CT HEAD WO CONT HISTORY: L sided weakness COMPARISON: 10/25/2020 WORKSTATION ID: [id number] TECHNIQUE: Multiplanar CT images of the head were obtained without contrast. All CT exams at this facility use one or more dose reduction techniques including  automatic exposure control, mA/kV adjustment per patient's size, or iterative reconstruction technique. FINDINGS: No acute intracranial hemorrhage. No mass effect or evidence of acute infarction. Mild diffuse parenchymal volume loss. No hydrocephalus.  There is subcortical and periventricular hypodensity compatible with gliosis due to nonacute ischemic change. Orbits are unremarkable.  Paranasal sinuses and mastoid air cells are unremarkable. Calvarium intact. Aspect score: 10       IMPRESSION: No acute intracranial abnormality. Electronically signed by: Patsy Lager, MD 02/06/2021 4:38 AM EST       CTA HEAD      Result Date: 02/06/2021   CTA HEAD, CTA NECK INDICATION: headache. Status post recent right MCA thrombectomy. COMPARISON: CTA head/neck same day TECHNIQUE: CTA of the head and neck was performed with  intravenous contrast. Multiplanar two-dimensional and maximum intensity projection  reformats performed and reviewed. To expedite the results of stroke protocol patients, volumetric 3-D imaging was not included at the time of dictation. All CT exams at this facility use one or more dose reduction techniques including automatic exposure  control, mA/kV adjustment per patient's size, or iterative reconstruction technique. CAROTID STENOSIS REFERENCE USING NASCET  CRITERIA % Stenosis = (1 - narrowest diameter/diameter of distal artery) x100 Mild: < 50% stenosis Moderate: 50-69% stenosis.  Severe: 70-94% stenosis. Near occlusion: 95-99% stenosis. Occluded: 100% stenosis FINDINGS: Head Vasculature: There has been interval thrombectomy of the right MCA. The right MCA currently remains patent and perfuses the right MCA territory as expected,  grossly symmetrical compared to the left MCA territory. Left MCA remains patent. Anterior and posterior cerebral arteries remain patent. Vertebrobasilar system is patent. No discrete aneurysm or AVM. Neck Vasculature: There is no hemodynamically significant  stenosis within the carotid or vertebral arteries. There is no evidence of dissection. Brain: No findings to suggest acute territorial infarction. No parenchymal hemorrhage. Mild generalized cerebral atrophy and periventricular chronic microvascular changes.  Other: Some small volume gas within several venous structures involving the right face and neck, likely secondary to recent procedure. Upper lungs: Trace bilateral pleural effusions. Ascending aortic ectasia measuring up to 4.5 cm.       IMPRESSION: 1.  Status post right MCA thrombectomy. Right MCA currently remains widely patent. 2.  No large vessel occlusion in the head or neck. No evidence of dissection. 3.  No parenchymal hemorrhage or evidence of large territorial infarction. Electronically  signed by: Cecilio Asper, MD 02/06/2021 3:22 PM EST       CTA HEAD      Result Date: 02/06/2021   EXAMINATION: CTA NECK, CTA HEAD INDICATION: L sided weakness COMPARISON: No comparison available. TECHNIQUE: CTA Head and Neck with IV contrast. Sagittal and coronal reconstructions. 3D-MIP imaging was performed. Stenosis calculated using NASCET criteria.  Reference per NASCET criteria for degree of stenosis: MILD: less than 50% stenosis. MODERATE: 50-69% stenosis. SEVERE: 70-94% stenosis. NEAR OCCLUSION: 95-99% stenosis. Conventional, CT, or  MR angiographic measurements of internal carotid artery (ICA)  stenoses are based on a ratio of artery diameters, with the ICA stenosis as the numerator and the distal ICA, just beyond the stenosis, as the denominator. All CT exams at this facility use one or more dose reduction techniques including automatic exposure  control, mA/kV adjustment per patient's size, or iterative reconstruction technique. WORKSTATION ID: [id number] FINDINGS: Head Vasculature: No AVM, aneurysm or dissection. There is focal severe stenosis of the right distal M1 MCA with marked decreased  enhancement of the MCA vessels distal to the region of stenosis compared to the contralateral side. Neck Vasculature: No dissection or hemodynamically significant stenosis. Brain: No enhancing brain lesion. Neck Soft Tissues: No significant lymph node  enlargement. Other: Imaged lung apices are unremarkable.       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 Findings communicated by phone on 02/06/2021 4:44 AM EST  Electronically signed by: Patsy Lager, MD 02/06/2021 4:46 AM EST       CTA NECK      Result Date: 02/06/2021   CTA HEAD, CTA NECK INDICATION: headache. Status post recent right MCA thrombectomy. COMPARISON: CTA head/neck same day TECHNIQUE: CTA of the head and neck was performed with  intravenous contrast. Multiplanar two-dimensional and maximum intensity projection  reformats performed and reviewed. To expedite the results of stroke protocol patients, volumetric 3-D imaging was not included at the time of dictation. All CT exams at this facility use one or more dose reduction techniques including automatic exposure  control, mA/kV adjustment per patient's size, or iterative reconstruction technique. CAROTID STENOSIS REFERENCE USING NASCET CRITERIA % Stenosis = (1 - narrowest diameter/diameter of distal artery) x100 Mild: < 50% stenosis Moderate: 50-69% stenosis.  Severe: 70-94% stenosis. Near occlusion: 95-99% stenosis.  Occluded: 100% stenosis FINDINGS: Head Vasculature: There has been interval thrombectomy of the right MCA. The right MCA currently remains patent and perfuses the right MCA territory as expected,  grossly symmetrical compared to the left MCA territory. Left MCA remains patent. Anterior and posterior cerebral arteries remain patent. Vertebrobasilar system is patent. No discrete aneurysm or AVM. Neck Vasculature: There is no hemodynamically significant  stenosis within the carotid or vertebral arteries. There is no evidence of dissection. Brain: No findings to suggest acute territorial infarction. No parenchymal hemorrhage. Mild generalized cerebral atrophy and periventricular chronic microvascular changes.  Other: Some small volume gas within several venous structures involving the right face and neck, likely secondary to recent procedure. Upper lungs: Trace bilateral pleural effusions. Ascending aortic ectasia measuring up to 4.5 cm.       IMPRESSION: 1.  Status post right MCA thrombectomy. Right MCA currently remains widely patent. 2.  No large vessel occlusion in the head or neck. No evidence of dissection. 3.  No parenchymal hemorrhage or evidence of large territorial infarction. Electronically  signed by: Cecilio Asper, MD 02/06/2021 3:22 PM EST       CTA NECK      Result Date: 02/06/2021   EXAMINATION: CTA NECK, CTA HEAD INDICATION: L sided weakness COMPARISON: No comparison available. TECHNIQUE: CTA Head and Neck with IV contrast. Sagittal and coronal reconstructions. 3D-MIP imaging was performed. Stenosis calculated using NASCET criteria.  Reference per NASCET criteria for degree of stenosis: MILD: less than 50% stenosis. MODERATE: 50-69% stenosis. SEVERE: 70-94% stenosis. NEAR OCCLUSION: 95-99% stenosis. Conventional, CT, or MR angiographic measurements of internal carotid artery (ICA)  stenoses are based on a ratio of artery diameters, with the ICA stenosis as the numerator and the distal ICA, just beyond  the stenosis, as the denominator. All CT exams at this facility use one or more dose reduction techniques including automatic exposure  control, mA/kV adjustment per patient's size, or iterative reconstruction technique. WORKSTATION ID: [id number] FINDINGS: Head Vasculature: No AVM, aneurysm or dissection. There is focal severe stenosis of the right distal M1 MCA with marked decreased  enhancement of the MCA vessels distal to the region of stenosis compared to the contralateral side. Neck Vasculature: No dissection or hemodynamically significant stenosis. Brain: No enhancing brain lesion. Neck Soft Tissues: No significant lymph node  enlargement. Other: Imaged lung apices are unremarkable.       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 Findings communicated by phone on 02/06/2021 4:44 AM EST Electronically signed by: Patsy Lager, MD 02/06/2021 4:46 AM EST       CT ABD PELV W CONT  Result Date: 02/11/2021   CT ABD PELV W CONT INDICATION:  Anemia.   Hematoma. COMPARISON: CT abdomen pelvis 10/30/2020 TECHNIQUE: CT of the abdomen/pelvis was performed with intravenous contrast with coronal and sagittal reformatted images.  Multiplanar projection reconstructions  were performed and reviewed. All CT exams at this facility use one or more dose reduction techniques including automatic exposure control, mA/kV adjustment per patient's size, or iterative reconstruction technique. FINDINGS: LOWER THORAX: Small bilateral  pleural effusions, right greater than left. LIVER: Subcentimeter hypodensity inferior segment IVb, statistically a cyst. No suspicious liver lesions. BILIARY: Negative SPLEEN: Negative ADRENALS: Negative PANCREAS: Negative RENAL: Subcentimeter renal hypodensities  too small to characterize, likely cysts. These do not meet criteria for follow-up. No hydronephrosis or renal calculi. BLADDER: Bladder is mostly obscured from beam hardening artifact from bilateral hip arthroplasties. PELVIS:  Obscured from metallic artifact.  GI TRACT: No bowel obstruction or discrete active inflammation. Small umbilical hernia containing nonobstructed loop of small bowel. VASCULATURE: No evidence of aneurysm. Aortoiliac atherosclerosis LYMPH NODES: No abdominal or pelvic lymph nodes measuring  greater than 1 cm in short axis. PERITONEUM: There is no free air or free fluid. BONES: No acute osseous abnormalities. Unchanged total hip arthroplasties,. Intact. Unchanged plate and cerclage wires around the proximal left femur. Chronic heterogeneous  lobulated lesion anterior to the sacrum measuring 4.4 x 5.2 x 4.7 cm, nonspecific. BODY WALL: Large hematoma involving the right abdominal wall measuring 12 x 17 cm in axial dimension and at least 24 cm in craniocaudal dimension. Mass effect on surrounding  structures.       IMPRESSION: 1.  Large right abdominal wall hematoma measuring 12 x 17 x 24 cm. Mild mass effect on surrounding structures. 2.  Small bilateral pleural effusions, right greater than left 3.  Chronic lobulated lesion anterior to the lower sacrum, nonspecific.  This could represent extraosseous hematopoiesis. Other etiologies cannot be excluded at this time. Recommend nonemergent dedicated imaging such as MRI sacrum/coccyx, to include in and out of phase sequences. 4.  Small umbilical hernia containing nonobstructive  loop of small bowel. Electronically signed by: Cecilio Asper, MD 02/11/2021 11:33 AM EST       CT PERF W CBF      Result Date: 02/06/2021   EXAMINATION: CT PERF W CBF INDICATION: L sided weakness COMPARISON: 10/25/2020 TECHNIQUE: CT perfusion with cerebral blood flow. Maps of cerebral blood flow, cerebral blood volume, and mean transit time were generated using perfusion source data. All CT  exams at this facility use one or more dose reduction techniques including automatic exposure control, mA/kV adjustment per patient's size, or iterative reconstruction technique. WORKSTATION ID: [id number]  FINDINGS: Increased mean transit time and decreased  cerebral blood flow with mildly decreased blood volume in the right MCA distribution suggestive of acute infarct and ischemia penumbra.       IMPRESSION: Perfusion mismatch in the right MCA distribution suggestive of acute infarct and ischemic penumbra. Findings communicated by phone on 02/06/2021 4:42 AM EST Electronically signed by: Patsy Lager, MD 02/06/2021 4:44 AM EST       DUPLEX LOW EXT ARTERY RIGHT      Result Date: 02/11/2021   [id number]  Study ID: [id number]                                        [facility]                                            [address] [place],                                         [place]                   Lower Extremity Arterial Duplex Report Name: DONAE, KUEKER Date: 02/11/2021 01:01 PM MRN: [id number]             Patient Location: [id number] DOB: [birth year]         Age: 89 yrs  Gender: Female          Account #: [id number] Reason For Study: Right groin pain. Recent right groin access. ICD-10 K27.062 Ordering Physician: Mikey Kirschner Referring Physician: Colen Darling Performed By: Leane Platt, RVT Interpretation  Summary 1. No evidence of pseudoaneurysm or arteriovenous fistula. 2. No evidence of thrombus noted in the common femoral or proximal femoral veins. 3. A hematoma was visualized in the proximal groin region measuring 12 cm x 15 cm. ___________________________________________________________________________  QUALITY/PROCEDURE Lower Extremity Arterial Duplex Limited ([id number]). RIGHT LEG A duplex scan of the arteries and veins in the right  groin region was performed. There was no evidence of a pseudoaneurysm or an arteriovenous fistula noted in the right groin  region. The common femoral, superficial femoral and deep femoral arteries are multiphasic. The common femoral vein and proximal femoral vein were compressible with no evidence of intraluminal thrombus. A hematoma was visualize in the proximal groin region  measuring  12 cm x 15cm with heterogenous echos. Electronically signed byDR Wenda Low, MD   02/11/2021 05:48 PM       NEUROVASCULAR PROCEDURE      Result Date: 02/10/2021   Table formatting from the original result was not included. NEUROSURGERY OPERATIVE REPORT   Patient: [name] [name] MRN: [id number]  SSN: [id number]  Date of Birth: 1933-06-14  Age: 87 y.o.  Sex: female              Surgeon: Alpha Gula, MD   Date of Procedure:  02/06/2021   Preoperative Diagnosis: Right M1 occlusion   Postoperative Diagnosis: Same   Procedure: 1. Right common femoral arteriogram (GROIN ACCESS: 9:30AM) 2. Right innominate arteriogram 3. Right internal carotid arteriogram 4. Mechanical thrombectomy  with Trevo stent retriever and mechanical aspiration (FIRST PASS 11:07AM) 5. Right internal carotid arteriogram (TICI REPERFUSION: 11:12AM)   Indications: 87 y.o. female with LKN 8:30PM last night. This AM, she woke up with L sided weakness and dysarthria.  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 No IV-TPA was given. She has a history of atrial fibrillation, but not on anticoagulation because of risk of falls. Lives in assisted living.  The risk, benefits,  alternatives were clearly discussed with her son and power of attorney, and she would elected to have her undergo the above-stated procedure.   Procedure in Detail: After consent, the patient was brought to the neurovascular suite.  Anesthesia was induced.   Lines were placed.  The patient was positioned supine with all pressure points well-padded.  The  right groin was shaved, cleaned, and prepped in standard sterile fashion.  Timeout was performed.  Systolic blood pressure goals were kept 140-180 throughout  the case. Lidocaine was injected.  The right common femoral artery was accessed using a micropuncture kit and an 8 [place] short ACT sheath was placed.  A connector tubing was given to anesthesia to transduce blood pressure throughout the case. RAO angiogram  revealed antegrade flow into the right common femoral artery with access point above the bifurcation, with no atherosclerosis or dissection, amenable to use of a closure device.  Heparin 2000 units was administered IV.    A 088 penumbra catheter was  advanced over a 5 [place] select catheter and 035 Glidewire across the aortic arch into the right innominate artery.  Based on the tortuosity of the aortic arch as well as the innominate artery, the 2F select catheter was removed.  The decision was made  to remove the catheters and obtain more support. The A line and blood pressure cuff were correlated. The 8 French short sheath was exchanged for an Arrow 85F medium sheath catheter.  The 088 penumbra catheter was advanced over a Simmons 2 catheter and  Glidewire advantage across the aortic arch and into innominate artery and the innominate artery.  Multiple attempts were made to access the innominate artery and right common carotid artery and right internal carotid artery with much difficulty. Attempts  were made to catheterize the arch and innominate artery with the Glidewire advantage and 035 Glidewire.  After pulling out the Glidewire advantage, there was evidence of a significant amount of clot in the [facility] catheter.  This occurred on two separate  occasions. The Simmons catheter had to be removed twice based on the degree of clot that was found in the catheter.  Additional 1000 units of heparin was administered.   After multiple attempts, the Arrow  sheath and 088 catheter were parked on the proximal   curve of the arch, at the take-off of the innominate artery. The [facility] catheter was removed. AP and lateral angiograms of the right innominate artery provided a roadmap for further catheterization into the R ICA. There was no evidence of significant  carotid stenosis.  Once the there was sufficient support, the 035 Glidewire was advanced into the right internal carotid artery and the 5 [place] select catheter followed into the proximal right cervical segment of the ICA.  The Valentine was removed.   AP and lateral angiograms of the right internal carotid artery revealed antegrade flow into the ICA, ACA, M1 segment, with evidence of a right M1 occlusion.    Next, a phenom 021 microcatheter was advanced over a Synchro 2 standard microwire into the  right ICA and right MCA.  The microwire was removed.  Next a Trevo 4 x 40 stent retriever was advanced, centered on the focal area of occlusion and deployed under fluoroscopic guidance.  The microcatheter was removed.  After approximately 5 minutes, the  stent retriever was removed while the 28F select catheter was placed under aspiration.  As the stent was removed, the 5 [place] select catheter and the 088 penumbra catheter advanced further into the cervical segment of the ICA.  There was a significant  amount of thick dark clot noted in the stent retriever.  Post mechanical thrombectomy angiogram of the right ICA revealed antegrade flow into the ICA, ACA, MCA, with TICI 3 reperfusion.  The catheters were withdrawn.  Hemostasis was obtained using a 6  / 7 Mynx and manual compression.        : 1. Right M1 occlusion s/p mechanical thrombectomy with Trevo stent retriever with TICI 3 reperfusion.           Telemetry: rr      Current Facility-Administered Medications      Medication      ?  hydrOXYzine HCL (ATARAX) tablet 25 mg      ?  polyethylene glycol (MIRALAX) packet 17 g      ?  0.9% sodium chloride infusion 250 mL      ?  promethazine (PHENERGAN) 12.5 mg in NS 50 mL  IVPB      ?  0.9% sodium chloride infusion 250 mL      ?  atorvastatin (LIPITOR) tablet 40 mg      ?  acetaminophen (TYLENOL) tablet 1,000 mg      ?  saline peripheral flush soln 10-20 mL      ?  [Held by provider] apixaban (ELIQUIS) tablet 5 mg      ?  albuterol-ipratropium (DUO-NEB) 2.5 MG-0.5 MG/3 ML      ?  [Held by provider] furosemide (LASIX) tablet 20 mg      ?  hydrOXYchloroQUINE (PLAQUENIL) tablet 200 mg      ?  [Held by provider] lactulose (CHRONULAC) 10 gram/15 mL solution 30 mL      ?  levothyroxine (SYNTHROID) tablet 125 mcg      ?  [Held by provider] lisinopriL (PRINIVIL, ZESTRIL) tablet 10 mg      ?  [Held by provider] metoprolol tartrate (LOPRESSOR) tablet 25 mg      ?  [Held by provider] potassium chloride (K-DUR, KLOR-CON M20) SR tablet 40 mEq              Impression :  Acute right M1 stroke status post thrombectomy   Left-sided weakness improved   Systemic hypertension   History of atrial fibrillation   Status post Dobbhoff placement                  1.  Continue current treatment   2.  Physical therapy   3.  Encouraged to get out of bed.   4.  Condition has been discussed with staff   5.           Risk of deterioration: Moderate           Total time spent : Total time spent today on the patient was 32 minutes which consisted of: Preparing to see  the patient, reviewing noted diagnostic studies and all interval lab data, obtaining//reviewing history,  performing exam, counseling/educating the patient, independently interpreting results and communicating results to the patient, ordering follow-up  tests, documentation and care coordination.  This is exclusive of any other services or procedures provided              Care Plan discussed with: Patient, Nursing Staff, and >50% of time spent in counseling and coordination of care           Discussed:  Care Plan and D/C Planning   Prophylaxis:  DVT and Pressure ulcers   Disposition:  Home w/Family   Reviewed: Medications, allergies, clinical  lab test results and imaging results have been reviewed. Any abnormal findings have been addressed.    Dragon medical dictation software was used for portions of this report. Unintended grammatical errors may occur.                            Past Medical/Surgical History:          Past Medical History:        Diagnosis  Date         ?  Arthritis       ?  Deep venous thrombosis ([place])       ?  Endocrine disease            hypothyroid         ?  Hemochromatosis           ?  Hypertension            Past Surgical History:         Procedure  Laterality  Date          ?  HX ORTHOPAEDIC              left knee replacement          Social History:          Social History          Socioeconomic History         ?  Marital status:  WIDOWED       Tobacco Use         ?  Smoking status:  Never     ?  Smokeless tobacco:  Never       Vaping Use         ?  Vaping Use:  Never used       Substance and Sexual Activity         ?  Alcohol use:  No         ?  Drug use:  No  Family History:     History reviewed. No pertinent family history.     Allergies:          Allergies        Allergen  Reactions         ?  Shellfish Derived  Hives         ?  Sulfa (Sulfonamide Antibiotics)  Hives                   Admitting Diagnosis:          Patient Active Problem List        Diagnosis  Code         ?  Deep venous thrombosis (HCC)  I82.409     ?  Subdural hematoma caused by concussion  S06.5XAA     ?  Subdural hematoma  S06.5XAA     ?  Metabolic encephalopathy  B14.78     ?  Contusion of left hip  S70.02XA     ?  Left elbow contusion  S50.02XA     ?  Multiple falls  R29.6     ?  Atrial fibrillation ([place])  I48.91     ?  Other partial intestinal obstruction ([place])  K56.690     ?  COVID-19  U07.1     ?  Stroke [facility])  I63.9         ?  CVA (cerebral vascular accident) [facility])  I63.9              Drusilla Kanner, MD   February 16, 2021

## 2021-02-16 NOTE — Progress Notes (Signed)
OCCUPATIONAL THERAPY TREATMENT     Patient: [name] [name] (87 y.o. female)  Room: 6628/6628    Primary Diagnosis: Stroke [facility]) [I63.9]  CVA (cerebral vascular accident) (HCC) [I63.9]  Atrial fibrillation (HCC) [I48.91]   Procedure(s) (LRB):  Procedure Cancelled/Not Performed - ESOPHAGOGASTRODUODENOSCOPY (EGD) (N/A) 9 Days Post-Op  Date of Admission: 02/06/2021   Length of Stay:  10 day(s)  Insurance: Payor: [facility] / Plan: [facility] MEDICARE A & B / Product Type: Medicare /      Date: 02/16/2021  In time:  13:15        Out time:  13:41    Isolation:  There are currently no Active Isolations       MDRO: No active infections    Precautions:  Poor Safety Awareness, and L sided weakness, SBP 100-140   Ordered weight bearing status: NA    Current diet order: ADULT DIET Regular; Mildly Thick (Nectar)    ASSESSMENT:    Based on the objective data described below, the patient presents with       -  patient making progress towards established goals.   -  patient tolerated session well with no complications.   -  patient motivated to participate in OT to return to prior level of function in ADLs, IADLs and functional mobility.    - Modified Rankin Score (mRS): 4 - Severe disability; unable to walk without assistance and unable to attend to own bodily needs without assistance        PLAN: Continue OT POC    Recommendations:  Recommend continued skilled occupational therapy intervention to address above impairments.  Recommend out of bed activity to counteract ill effects of bedrest, with assistance from staff as needed.    Discharge Recommendations: skilled nursing facility (SNF).  Equipment Recommendations for Discharge: DME needs to be determined at time of d/c from rehab     Education/ communication:     Barriers to learning/limitations:  Yes;  difficulty processing new information  Education provided to: patient on (+) role of OT, (+) OT plan of care, (+) instructed patient on the importance of activity while hospitalized  to prevent a decline in function, (+) the importance of maintaining UE muscle strength and activity tolerance while hospitalized to prevent a decline in function, (+) staff assistance with mobility, (+) change positions frequently, (+) functional mobility, (+) discharge disposition/recommendations, (+) ADL training, (+) bathroom safety, (+) home safety, (+) safety  Educational handouts issued: none this session  Patient / family response to education: showing interest, trying to perform skills    SUBJECTIVE:     Patient agreed to OT session.    OBJECTIVE DATA SUMMARY:     Orders, labs, occupational therapy and chart reviewed on [facility]. Communicated with nursing staff. Patient cleared to participate in Occupational Therapy treatment.    Patient found: Bed, (+) bed/chair exit alarm    Most recent value for oxygen in flow sheets:  O2 Device: None (Room air) (02/14/21 0945)        Pain assessment: not rated, location: left  shoulder    Cognitive:   Mental status:   Orientation: Patient is oriented x 3  Communication: grossly intact  Attention Span:  fair (15-42min)  Follows commands: able to follow simple 1 step commands  Safety/Judgement: needs cueing for safety and precautions    Activities of Daily Living:  -  Patient completed washing face/hand with stand-by assistance, with MAX A for balance , set-up.  -  Patient  performed comb/brushing hair with moderate assistance, sitting EOB.  - Patient performed LB dressing donning socks with MAX A, bed level, patient able to lift bilateral legs when cued.   - RN and care partner came and went during session.     Mobility:  -  Patient performed rolling with maximum assistance  -  Patient completed supine to sit with  maximum assistance 1 person / minimum assistance 1 person, 2 person total.   -  Patient performed sit to supine with minimum assistance, maximum assistance, 2 person total.     Activity Tolerance:   - motivated to increase activity  - no apparent  distress    Balance:  - MAX A sitting eOB    Therapeutic Exercises:   none this session    Final Location: Patient positioned in bed, all needs within reach, agrees to call for assistance, (+) bed/chair exit alarm    Briscoe Deutscher, OTA  February 16, 2021

## 2021-02-16 NOTE — Progress Notes (Signed)
Patient is complaining of left shoulder pain  She is swallowing good with no complications.  No cough.  Watch her swallowing and she is not having any cough.  On examination she has mild left facial droop.  Strength of left upper extremity continues to be 4/5.  Right side abdominal pain and flank pain is better  Recommended for patient to go to a skilled nursing facility and she agrees

## 2021-02-17 LAB — METABOLIC PANEL, BASIC
Anion gap: 7 mmol/L (ref 5–15)
BUN: 9 mg/dl (ref 9–23)
CO2: 24 mEq/L (ref 20–31)
Calcium: 8.5 mg/dl — ABNORMAL LOW (ref 8.7–10.4)
Chloride: 109 mEq/L — ABNORMAL HIGH (ref 98–107)
Creatinine: 0.48 mg/dl — ABNORMAL LOW (ref 0.55–1.02)
GFR est AA: >60
GFR est non-AA: >60
Glucose: 119 mg/dl — ABNORMAL HIGH (ref 74–106)
Potassium: 3.1 mEq/L — ABNORMAL LOW (ref 3.5–5.1)
Sodium: 140 mEq/L (ref 136–145)

## 2021-02-17 LAB — CBC WITH AUTOMATED DIFF
BASOPHILS: 0.4 % (ref 0–3)
EOSINOPHILS: 2.4 % (ref 0–5)
HCT: 26.5 % — ABNORMAL LOW (ref 37.0–50.0)
HGB: 8.5 gm/dl — ABNORMAL LOW (ref 13.0–17.2)
IMMATURE GRANULOCYTES: 0.4 % (ref 0.0–3.0)
LYMPHOCYTES: 15.1 % — ABNORMAL LOW (ref 28–48)
MCH: 30.1 pg (ref 25.4–34.6)
MCHC: 32.1 gm/dl (ref 30.0–36.0)
MCV: 94 fL (ref 80.0–98.0)
MONOCYTES: 7.2 % (ref 1–13)
MPV: 9.7 fL (ref 6.0–10.0)
NEUTROPHILS: 74.5 % — ABNORMAL HIGH (ref 34–64)
NRBC: 0 (ref 0–0)
PLATELET: 337 10*3/uL (ref 140–450)
RBC: 2.82 M/uL — ABNORMAL LOW (ref 3.60–5.20)
RDW-SD: 53.4 — ABNORMAL HIGH (ref 36.4–46.3)
WBC: 8.5 10*3/uL (ref 4.0–11.0)

## 2021-02-17 LAB — AMMONIA
Ammonia, plasma: 49 umol/L — ABNORMAL HIGH (ref 11.2–31.7)
Ammonia: 49 umol/L — ABNORMAL HIGH (ref 11.2–31.7)

## 2021-02-17 LAB — PROTHROMBIN TIME + INR
INR: 1.3 — ABNORMAL HIGH (ref 0.1–1.1)
Prothrombin time: 15.3 seconds — ABNORMAL HIGH (ref 10.2–12.9)

## 2021-02-17 LAB — HEPATIC FUNCTION PANEL
ALT (SGPT): <7 U/L — ABNORMAL LOW (ref 10–49)
ALT: <7 U/L — ABNORMAL LOW (ref 10–49)
AST (SGOT): 20 U/L (ref 0.0–33.9)
AST: 20 U/L (ref 0.0–33.9)
Albumin: 2.4 gm/dl — ABNORMAL LOW (ref 3.4–5.0)
Albumin: 2.4 gm/dl — ABNORMAL LOW (ref 3.4–5.0)
Alk. phosphatase: 87 U/L (ref 46–116)
Alkaline Phosphatase: 87 U/L (ref 46–116)
Bilirubin, Direct: 1.5 mg/dl — ABNORMAL HIGH (ref 0.0–0.3)
Bilirubin, direct: 1.5 mg/dl — ABNORMAL HIGH (ref 0.0–0.3)
Bilirubin, total: 3.1 mg/dl — ABNORMAL HIGH (ref 0.30–1.20)
Protein, total: 5.4 gm/dl — ABNORMAL LOW (ref 5.7–8.2)
Total Bilirubin: 3.1 mg/dl — ABNORMAL HIGH (ref 0.30–1.20)
Total Protein: 5.4 gm/dl — ABNORMAL LOW (ref 5.7–8.2)

## 2021-02-17 LAB — MAGNESIUM
Magnesium: 1.6 mg/dL (ref 1.6–2.6)
Magnesium: 1.6 mg/dL (ref 1.6–2.6)

## 2021-02-17 LAB — BILIRUBIN, DIRECT
Bilirubin, Direct: 1.5 mg/dl — ABNORMAL HIGH (ref 0.0–0.3)
Bilirubin, direct: 1.5 mg/dl — ABNORMAL HIGH (ref 0.0–0.3)

## 2021-02-17 LAB — PROTIME-INR
INR: 1.3 — ABNORMAL HIGH (ref 0.1–1.1)
Protime: 15.3 seconds — ABNORMAL HIGH (ref 10.2–12.9)

## 2021-02-17 LAB — CBC WITH AUTO DIFFERENTIAL
Basophils %: 0.4 % (ref 0–3)
Eosinophils %: 2.4 % (ref 0–5)
Hematocrit: 26.5 % — ABNORMAL LOW (ref 37.0–50.0)
Hemoglobin: 8.5 gm/dl — ABNORMAL LOW (ref 13.0–17.2)
Immature Granulocytes: 0.4 % (ref 0.0–3.0)
Lymphocytes %: 15.1 % — ABNORMAL LOW (ref 28–48)
MCH: 30.1 pg (ref 25.4–34.6)
MCHC: 32.1 gm/dl (ref 30.0–36.0)
MCV: 94 fL (ref 80.0–98.0)
MPV: 9.7 fL (ref 6.0–10.0)
Monocytes %: 7.2 % (ref 1–13)
Neutrophils %: 74.5 % — ABNORMAL HIGH (ref 34–64)
Nucleated RBCs: 0 (ref 0–0)
Platelets: 337 10*3/uL (ref 140–450)
RBC: 2.82 M/uL — ABNORMAL LOW (ref 3.60–5.20)
RDW-SD: 53.4 — ABNORMAL HIGH (ref 36.4–46.3)
WBC: 8.5 10*3/uL (ref 4.0–11.0)

## 2021-02-17 LAB — BASIC METABOLIC PANEL
Anion Gap: 7 mmol/L (ref 5–15)
BUN: 9 mg/dl (ref 9–23)
CO2: 24 mEq/L (ref 20–31)
Calcium: 8.5 mg/dl — ABNORMAL LOW (ref 8.7–10.4)
Chloride: 109 mEq/L — ABNORMAL HIGH (ref 98–107)
Creatinine: 0.48 mg/dl — ABNORMAL LOW (ref 0.55–1.02)
EGFR IF NonAfrican American: >60
GFR African American: >60
Glucose: 119 mg/dl — ABNORMAL HIGH (ref 74–106)
Potassium: 3.1 mEq/L — ABNORMAL LOW (ref 3.5–5.1)
Sodium: 140 mEq/L (ref 136–145)

## 2021-02-17 MED ORDER — RIFAXIMIN 550 MG TAB
550 mg | Freq: Two times a day (BID) | ORAL | Status: AC
Start: 2021-02-17 — End: 2021-02-19
  Administered 2021-02-17 – 2021-02-19 (×5): via ORAL

## 2021-02-17 MED ORDER — LACTULOSE 10 GRAM/15 ML ORAL SOLUTION
10 gram/15 mL | Freq: Every day | ORAL | Status: AC
Start: 2021-02-17 — End: 2021-02-19
  Administered 2021-02-18 – 2021-02-19 (×2): via ORAL

## 2021-02-17 MED FILL — ACETAMINOPHEN 500 MG TAB: 500 mg | ORAL | Qty: 2

## 2021-02-17 MED FILL — ATORVASTATIN 40 MG TAB: 40 mg | ORAL | Qty: 1

## 2021-02-17 MED FILL — HYDROXYZINE 25 MG TAB: 25 mg | ORAL | Qty: 1

## 2021-02-17 MED FILL — POLYETHYLENE GLYCOL 3350 17 GRAM (100 %) ORAL POWDER PACKET: 17 gram | ORAL | Qty: 1

## 2021-02-17 MED FILL — XIFAXAN 550 MG TABLET: 550 mg | ORAL | Qty: 1

## 2021-02-17 MED FILL — LEVOTHYROXINE 125 MCG TAB: 125 mcg | ORAL | Qty: 1

## 2021-02-17 MED FILL — HYDROXYCHLOROQUINE 200 MG TAB: 200 mg | ORAL | Qty: 1

## 2021-02-17 NOTE — Progress Notes (Signed)
SPEECH LANGUAGE PATHOLOGY   DYSPHAGIA AND MOTOR SPEECH EVALUATIONS   Patient: [name] [name] (87 y.o. female) [birth year]  Room: 6628/6628  Primary Diagnosis: Stroke [facility]) [I63.9]  CVA (cerebral vascular accident) (HCC) [I63.9]  Atrial fibrillation (HCC) [I48.91]    Procedure(s) (LRB):  Procedure Cancelled/Not Performed - ESOPHAGOGASTRODUODENOSCOPY (EGD) (N/A) 10 Days Post-Op  PMH:   Past Medical History:   Diagnosis Date    Arthritis     Deep venous thrombosis (HCC)     Endocrine disease     hypothyroid    Hemochromatosis     Hypertension        Isolation:  There are currently no Active Isolations       MDRO: No current active infections  PPE: surgical mask, gloves  Precautions:  universal and aspiration     Date: 02/17/2021   Start Time:  11:30 End Time:  11:53      Total Time: 23 minutes   Dysphagia Evaluation: 10 minutes         Motor Speech Evaluation: 13 minutes          ASSESSMENT/ TREATMENT:   Orientation:  Pt awake, alert, able to follow contextual commands  Respiratory Status: RA  Dysphagia Evaluation: New orders received for ST evaluation due to new onset increased L facial weakness and dysarthria.  Pt recently discharged from our service 02/12/21 on a regular diet with mildly thick liquids.   Nurse reports no concerns for swallowing with current diet, but states mild dysarthria and hallucinations.  Pt seen sitting up in bed, L facial droop noted.  Mildly thick liquid via cup with mildly delayed swallow onset and no overt s/s aspiration or change in vocal quality.  Regular solids with functional oral prep/transit and effective oral clearance.  Recommend continue IDDSI 7- regular solids with mildly thick liquids.  No further dysphagia tx indicated at this time.  Functional Oral Intake Scale: Level 5: Total oral diet of multiple consistencies but requiring special preparation or compensations  Motor Speech Evaluation: Pt presents with mild dysarthria c/b decreased rate, decreased orality, imprecise artic, and  blended word boundaries resulting in 80% conversational intelligibility to trained, unfamiliar listener.     Dysarthria Severity Rating Scale: 2: Obvious speech disorder with intelligible speech- changes in voice (reduced loudness, reduced pitch variability, unsteady voice), difficulty speaking in specific communication contexts eg. noisy environment    PLAN/ RECOMMENDATIONS:                 Diet Recommendations: continue IDDSI 7- regular, IDDSI 2 mildly thick (nectar)   Compensatory Swallow Strategies: HOB ~60* for all po feeds and >45* at least 30 minutes following, SMALL, SINGLE sips, small bites/ sip, slow rate of eating/ feeding, alternate solids/ liquids   Medication Administration: as tolerated   Aspiration Precautions: may feed self independently  Risk(s) for Aspiration: advanced age , medical condition, current level of dysphagia, history of dysphagia, history of aspiration, acute CVA, brain lesions, head injury     Therapy Recommendations: pt would benefit from initiation of SLP for dysarthria 1-3x/ week to address goals per POC    Therapy Prognosis: good    Discharge Recommendations: to be determined  Other Recommended Services: OT, PT     SUBJECTIVE:    Patient Stated: "My tongue feels slow."  Pain Prior to Treatment: no pain reported   Pain Following Treatment: no pain reported    Diet Prior to Admission: IDDSI 7- regular, IDDSI 2 mildly thick (nectar)  Current Diet: IDDSI  7- regular, IDDSI 2 mildly thick (nectar)    OBJECTIVE:   Position: upright in bed  Oral Motor Assessment: decreased lingual ROM and coordination   Dentition: natural   Voice:  WFL  Consistencies: mildly thick (nectar) via cup, regular  Oral Phase: WFL  Pharyngeal Phase: delayed initiation  Esophageal Phase: wfl for b/s swallow eval         Safety: Following session, pt tray table, phone, and call bell within reach  Modified Rankin Score (MRS): Defer to OT/ PT    EDUCATION:   Education Provided: POC, diet recommendations, and  compensatory strategies for swallowing and speech  Individual Educated: patient  Comprehension: pt communicated [employer] manager Education: Educated Nurse to POC and diet recommendations.     IMAGING:   No valid procedures specified.    Thank you for this referral,      Kellie Shropshire MS CCC-SLP  Speech-Language Pathologist  [phone] or Secure Chat

## 2021-02-17 NOTE — Progress Notes (Signed)
Problem: Motor Speech Impaired (Adult)  Goal: *Acute Goals and Plan of Care (Insert Text)  Description: Reassessment due: 02/24/21  To clearly communicate basic wants/ needs, pt will:  1. Utilize decrease rate, increase orality, over articulate, independently to increase intelligibility >90% at conversations level.   2. Perform lingual and labial ROM and lingual and labial resistance exercises, independently cues to increase oral motor strength/range-of-motion/ coordination for articulation tasks  3. Complete articulatory agility tasks at sentence, conversation level, independently   Outcome: Progressing Towards Goal

## 2021-02-17 NOTE — Progress Notes (Signed)
Progress Note             Daily Progress Note: 02/17/2021    Assessment/Plan:        Left facial droop, weakness left arm>leg worse than Friday.   Dysarthric speech now present, probably related to facial weakness.        H/H drifted down over weekend.   Repeat CBC ordered.     Pt with hallucinations last 24 hrs., non-threatening.   (Seeing her deceased husband.)   Pt recognizes them as being hallucinations.     Mild sense of dyspnea.   No chest pain, abdominal pain, nausea.   Right abdominal discomfort better per patient.     Afebrile.   VSS.     AF w CVR, PVCs.        Lungs clear.     Large hematoma right flank/abdomen/groin.     Motor strength left arm/leg stable still +4/5 but worse, left facial droop more pronounced.         S/p acute R MCA CVA, likely sec A Fib.   Had been off anticoagulation due to high fall risk, multiple falls, multifactorial.       -s/p emergent thrombectomy.   Much improved neuro status compared to admission.       -   Worsened left-sided weakness, left facial droop.   May be recurrence of right MCA CVA.   Anticoagulation contraindicated.   Not candidate for aggressive interventions.   Discussed with patient and will discuss with son.     -   Recheck CBC, transfuse if needed, check ammonia level.   Empirically resume Rifaxamin, Lactulose.     -   Re-consult speech.  2.   Large right hematoma with acute blood loss anemia, s/p transfusion PRBCs x 2.   Last dose Eliquis 11/28 am.   CBC q am x several days.   No S/S infection, monitor.  3.   Anticoagulation DCd sec #2.  4.   Chronic AF, CVR.  5.   Chr diastolic CHF, EF 93% (10/31/20).  6.   Hypothyroidism.   Resume replacement.  7.   RA.   On Plaquenil.     8.   Cirrhosis of liver prob sec hemochromatosis (+/- Methotrexate).   Ammonia nl.   DC Rifaxamin.  9.   Hepatic encephalopathy with hallucinations +/- superimposed Lewy Body dementia.  10.   DNR.  11.   SCDs for DVT prophylaxis.  12.   Hypomagnesemia, replace.  13.   HTN, meds on  hold.    Discussed with son per telephone.   Patient and son both understand no good options for patient.      Will eventually need rehab.         Subjective:      Review of Systems:  ROS    Objective:   Physical Exam: Physical Exam  Cardiovascular:      Rate and Rhythm: Normal rate. Rhythm irregular.   Pulmonary:      Breath sounds: Normal breath sounds.   Abdominal:      General: There is no distension.      Palpations: Abdomen is soft.      Comments: Hernia not distended.   Musculoskeletal:      Right lower leg: No edema.      Left lower leg: No edema.   Skin:     Comments: Large right flank/abdomen hematoma.   Neurological:      Mental Status: She is alert.  Visit Vitals  BP 114/65 (BP 1 Location: Right upper arm, BP Patient Position: Supine)   Pulse 79   Temp 97.7 ??F (36.5 ??C)   Resp 17   Ht 5\' 3"  (1.6 m)   Wt 71.7 kg (158 lb 1.1 oz)   SpO2 98%   BMI 28.00 kg/m??      O2 Device: None (Room air)    Temp (24hrs), Avg:97.9 ??F (36.6 ??C), Min:97.5 ??F (36.4 ??C), Max:98.4 ??F (36.9 ??C)    No intake/output data recorded.   12/03 1901 - 12/05 0700  In: 457 [P.O.:457]  Out: 500 [Urine:500]      Data Review:       24 Hour Results:  No results found for this or any previous visit (from the past 24 hour(s)).      Problem List:  Problem List as of 02/17/2021 Date Reviewed: 03/04/2021            Codes Class Noted - Resolved    COVID-19 ICD-10-CM: U07.1  ICD-9-CM: 079.89  10/21/2020 - Present    Overview Signed 11/01/2020  1:27 PM by 11/03/2020, MD     10/21/20 SARS CoV-2 detected             Stroke [facility]) ICD-10-CM: I63.9  ICD-9-CM: 434.91  04-Mar-2021 - Present        CV[facility] (cere[facility]) [facility]) ICD-10-CM: I63.9  ICD-9-CM: 434.91  03-04-2021 - Present        Atrial fibrillation (HCC) ICD-10-CM: I48.91  ICD-9-CM: 427.31  10/31/2020 - Present    Overview Addendum 11/06/2020 11:15 AM by 11/08/2020, MD     Noted this AM (10/31/20)  Rate controlled (84 BPM)  No previously documented hx of AFIB  CHADS2VASc = 6   (08/27/17 CT Acute interhemispheric fissure subdural hemorrhage. Chronic small vessel ischemic change)  ECHO:  EF 75%< LV 25/44, S/PW 9/10, LAVI 28, Mild MR, Mod TR, RVSP 43             Other partial intestinal obstruction (HCC) ICD-10-CM: K56.690  ICD-9-CM: 560.89  10/31/2020 - Present    Overview Addendum 11/01/2020  1:28 PM by 11/03/2020, MD     10/30/20 CT Small bowel obstruction secondary to presumed small bowel containing  umbilical hernia. Stranding within the hernia sac suggesting engorgement. No  pneumatosis.  11/01/20 SURG We will continue to follow the patient to ensure she does not develop worsening incarceration and bowel obstruction from the hernia.  Currently if we can delay the surgery she would likely benefit from decreased risk of postoperative complications             Metabolic encephalopathy ICD-10-CM: G93.41  ICD-9-CM: 348.31  10/21/2020 - Present        Contusion of left hip ICD-10-CM: 12/21/2020  ICD-9-CM: 924.01  10/21/2020 - Present        Left elbow contusion ICD-10-CM: S50.02XA  ICD-9-CM: 923.11  10/21/2020 - Present        Multiple falls ICD-10-CM: R29.6  ICD-9-CM: V15.88  10/21/2020 - Present        Deep venous thrombosis (HCC) ICD-10-CM: I82.409  ICD-9-CM: 453.40  Unknown - Present        Subdural hematoma caused by concussion ICD-10-CM: S06.5XAA  ICD-9-CM: 852.29  08/27/2017 - Present        Subdural hematoma ICD-10-CM: S06.5XAA  ICD-9-CM: 432.1  08/27/2017 - Present       Medications reviewed  Current Facility-Administered Medications   Medication Dose Route Frequency  rifAXIMin (XIFAXAN) tablet 550 mg  550 mg Oral BID    [START ON 02/18/2021] lactulose (CHRONULAC) 10 gram/15 mL solution 30 mL  20 g Oral DAILY    hydrOXYzine HCL (ATARAX) tablet 25 mg  25 mg Oral TID PRN    polyethylene glycol (MIRALAX) packet 17 g  17 g Oral DAILY    0.9% sodium chloride infusion 250 mL  250 mL IntraVENous PRN    promethazine (PHENERGAN) 12.5 mg in NS 50 mL IVPB  12.5 mg IntraVENous Q6H PRN    0.9% sodium  chloride infusion 250 mL  250 mL IntraVENous PRN    atorvastatin (LIPITOR) tablet 40 mg  40 mg Oral QHS    acetaminophen (TYLENOL) tablet 1,000 mg  1,000 mg Oral Q6H PRN    saline peripheral flush soln 10-20 mL  10-20 mL IntraVENous PRN    [Held by provider] apixaban (ELIQUIS) tablet 5 mg  5 mg Oral BID    albuterol-ipratropium (DUO-NEB) 2.5 MG-0.5 MG/3 ML  3 mL Nebulization Q4H PRN    [Held by provider] furosemide (LASIX) tablet 20 mg  20 mg Oral DAILY    hydrOXYchloroQUINE (PLAQUENIL) tablet 200 mg  200 mg Oral BID    levothyroxine (SYNTHROID) tablet 125 mcg  125 mcg Oral ACB    [Held by provider] lisinopriL (PRINIVIL, ZESTRIL) tablet 10 mg  10 mg Oral DAILY    [Held by provider] metoprolol tartrate (LOPRESSOR) tablet 25 mg  25 mg Oral Q8H    [Held by provider] potassium chloride (K-DUR, KLOR-CON M20) SR tablet 40 mEq  40 mEq Oral DAILY       Care Plan discussed with: Patient/Family    Total time spent with patient: 40 minutes.    [name], MD  February 17, 2021

## 2021-02-18 MED ORDER — POTASSIUM CHLORIDE SR 20 MEQ TAB, PARTICLES/CRYSTALS
20 mEq | ORAL | Status: DC
Start: 2021-02-18 — End: 2021-02-18

## 2021-02-18 MED ORDER — POTASSIUM CHLORIDE 10 % ORAL LIQUID
20 mEq/15 mL | ORAL | Status: AC
Start: 2021-02-18 — End: 2021-02-18
  Administered 2021-02-18 (×2): via ORAL

## 2021-02-18 MED ORDER — MAGNESIUM OXIDE 400 MG TAB
400 mg | Freq: Every day | ORAL | Status: AC
Start: 2021-02-18 — End: 2021-02-21
  Administered 2021-02-18 – 2021-02-19 (×2): via ORAL

## 2021-02-18 MED FILL — XIFAXAN 550 MG TABLET: 550 mg | ORAL | Qty: 1

## 2021-02-18 MED FILL — HYDROXYCHLOROQUINE 200 MG TAB: 200 mg | ORAL | Qty: 1

## 2021-02-18 MED FILL — LEVOTHYROXINE 125 MCG TAB: 125 mcg | ORAL | Qty: 1

## 2021-02-18 MED FILL — MAGNESIUM OXIDE 400 MG TAB: 400 mg | ORAL | Qty: 1

## 2021-02-18 MED FILL — LACTULOSE 20 GRAM/30 ML ORAL SOLUTION: 20 gram/30 mL | ORAL | Qty: 30

## 2021-02-18 MED FILL — ATORVASTATIN 40 MG TAB: 40 mg | ORAL | Qty: 1

## 2021-02-18 MED FILL — POTASSIUM CHLORIDE 10 % ORAL LIQUID: 20 mEq/15 mL | ORAL | Qty: 30

## 2021-02-18 NOTE — Progress Notes (Signed)
Oral Resistance Exercises    Lips:  Smile, hold back weaker side of mouth towards your earlobe, hold it there as you try and pucker, resist against the pucker, hold 5-7 seconds each.  Relax and repeat x10.     Pucker, place your finger on the corner of the weaker side of your mouth, hold the pucker tight as you try and pull that corner towards your earlobe, hold 5-7 seconds.  Relax and repeat x10.    Tongue:  Place a spoon/ tongue depressor inside one cheek, push your tongue against the item, hold 5-7 seconds each.  Relax and repeat x10.  Repeat on opposite side.    Place spoon/ tongue depressor along the roof of your mouth, just behind your front teeth, push the tip of your tongue against the spoon/ tongue depressor as you push the spoon/ tongue depressor down, hold 5-7 seconds.  Relax and repeat x10.

## 2021-02-18 NOTE — Progress Notes (Signed)
Chaplain Follow-Up    Start Visit: 19:15  End Visit: 19:20    Chaplain conducted a Follow up consultation and Spiritual Assessment for [name] [name], who is a 87 y.o.,female.      The Chaplain provided the following Interventions:  Continued a relationship of care and support.   Patient was asleep.  There was no family present.  Chart reviewed.    The following outcomes where achieved:  Patient visit and prayer on patient's behalf at doorway using Book of Common Prayer.    Assessment:  Patient has no known religious/cultural needs that will affect patient's preferences in health care.    Plan:  Chaplains will continue to follow and will provide pastoral care on an as needed/requested basis.    Oswald Hillock [employer]  Pastoral Care  [phone]

## 2021-02-18 NOTE — Progress Notes (Signed)
 SPEECH LANGUAGE PATHOLOGY   MOTOR SPEECH TREATMENT     Patient: [name] [name] (87 y.o. female) 07/21/1933  Room: 6628/6628  Date: 02/18/2021  Primary Diagnosis: Stroke [facility]) [I63.9]  CVA (cerebral vascular accident) (HCC) [I63.9]  Atrial fibrillation (HCC) [I48.91]    Procedure(s) (LRB):  Procedure Cancelled/Not Performed - ESOPHAGOGASTRODUODENOSCOPY (EGD) (N/A) 11 Days Post-Op    Isolation:  There are currently no Active Isolations       MDRO: No current active infections  PPE: surgical mask, gloves    Precautions:   universal and aspiration   Start Time:  11:42 End Time:  11:55          TREATMENT:    Treatment: Motor Speech tx completed b/s.  Pt performed lingual and labial ROM and lingual and labial resistance exercises, with min cues to increase oral motor strength/range-of-motion/ coordination for articulation tasks.  Min cues required for use of compensatory strategies to increase speech intelligibility and clarity including decreased rate, increase orality, and over articulation.  Pt with 100% speech intelligibility at the conversation level with use of strategies.  Safety: Following session, pt tray table, phone, and call bell within reach   Modified Rankin Score (MRS): Defer to OT/ PT    PLAN/ RECOMMENDATIONS:   Therapy Recommendations: pt would benefit from continuation of SLP as per POC   Therapy Progression: improving appropriately, progressing toward goals   Therapy Upon Discharge: no follow up indicated    SUBJECTIVE:    Patient Stated: A little.   Pain Prior to Treatment: no pain reported   Pain Following Treatment: no pain reported     OBJECTIVE:    Motor Speech Tasks:  As above    EDUCATION:   Education Provided: POC, compensatory strategies for speech, and OMEx  Individual Educated: patient  Comprehension: pt communicated comprehension        Thank you,      Isaiah Lima MS CCC-SLP  Speech-Language Pathologist  [phone] or Secure Chat

## 2021-02-18 NOTE — Progress Notes (Signed)
CM following    Son good with [facility].      > Accepted.    Will confirm bed availability       Emailed Mellody Dance     Will return to [facility] [place] afterwards.     [facility] able to accept 12/7/2

## 2021-02-18 NOTE — Progress Notes (Signed)
 OCCUPATIONAL THERAPY TREATMENT            Patient: [name] [name] (87 y.o. female)  Room: 6628/6628    Primary Diagnosis: Stroke [facility]) [I63.9]  CVA (cerebral vascular accident) (HCC) [I63.9]  Atrial fibrillation (HCC) [I48.91]   Procedure(s) (LRB):  Procedure Cancelled/Not Performed - ESOPHAGOGASTRODUODENOSCOPY (EGD) (N/A) 11 Days Post-Op  Date of Admission: 02/06/2021   Length of Stay:  12 day(s)  Insurance: Payor: [facility] / Plan: [facility] MEDICARE A & B / Product Type: Medicare /      Date: 02/18/2021  In time:  0822        Out time:  0921    Isolation:  There are currently no Active Isolations       MDRO: No active infections    Precautions: Falls, Poor safety awareness, and L sided weakness  Ordered weight bearing status: None    Current diet order: ADULT DIET Regular; Mildly Thick (Nectar)    ASSESSMENT:    Based on the objective data described below, the patient presents with       - patient pleasantly confused but cooperative and agreeable to OT session  - patient c/o 5/10 pain LUE with no movement, 10/10 pain with movement  - deferred LUE PROM d/t pain this session  - decreased cognition  - patient is highly motivated to increase activity  - patient experienced hallucinations t/o session, nursing notified       PLAN: 1-5x/week    Recommendations:  Recommend continued skilled occupational therapy intervention to address above impairments.  Recommend out of bed activity to counteract ill effects of bedrest, with assistance from staff as needed.    Discharge Recommendations: skilled nursing facility (SNF).  Further Equipment Recommendations for Discharge: DME needs to be determined at time of d/c from rehab     Education/ communication:       Barriers to Learning/Limitations:  yes;  physical  Education provided to: patient on (+) role of OT, (+) OT plan of care, (+) Instructed patient in the benefits of maintaining activity tolerance, functional mobility, and independence with self care tasks during acute  stay  to ensure safe return home and to baseline. Encouraged patient to increase frequency and duration OOB, be out of bed for all meals, perform daily ADLs (as approved by RN/MD regarding bathing etc), and performing functional mobility to/from bathroom with staff assistance as needed., (+) instructed patient on the importance of activity while hospitalized to prevent a decline in function, (+) staff assistance with mobility, (+) change positions frequently, (+) functional mobility, (+) safety  Educational Handouts issued: none this session  Patient / Family readiness to learn indicated by: verbalized and demonstrated understanding, showing interest, trying to perform skills    SUBJECTIVE:       Patient agreeable to OT session.    OBJECTIVE DATA SUMMARY:   Orders, labs, occupational therapy and chart reviewed on [name] [name]. Communicated with nursing staff. Patient cleared to participate in Occupational Therapy treatment.    Patient found: Bed, (+) telemetry, (+) pure wick urine system    Pain assessment: 5/10 pain no movement LUE, 10/10 pain with movement    Cognitive:   Mental Status: Alert and oriented to self.  Not oriented to place, time.  Having some visual and auditory hallucinations during session  Communication: patient interacts appropriately  Follows commands: intact; consistently follows commands  General Cognition: impaired, impaired short term memory may prevent retention of education  Safety/Judgement: appropriate awareness of environment and  need for assistance, is aware that she has hallucinations     Activities of Daily Living:  -  Eating:  independent, set-up in hospital setting, increased time bring food to mouth with right hand. Patient required assistance opening containers/packets. Patient able to stir oatmeal independently to mix, increased time  -  Grooming:  washing face, stand-by assistance, set-up, increased time. Maximum assistance washing hand. Patient has decline in LEU d/t pain  and limited in assisting with ADL's  -  UB bathing:  patient c/o itchy back. COTA washed and applied lotion to back with maximum assistance, set-up, increased time  -  UB dressing: maxA to don/doff hospital gown, patient required verbal/visual/tactile cues to utilize RUE to assist LUE to thread through gown sleeve  -  LB dressing: maxA to don hospital socks, patient able to follow 1 step command to lift leg for COTA to put sock on LE    Mobility:  Supine to sit- mod/max assist x 2 person  Sit to supine- max assist x 2 person  Scooting- max assist x 2 person    Transfers:  Sit to stand- min/mod assist x 2 person, gait belt  Stand to sit- min assist, gait belt    Neuro Re-education/Balance:  Static sitting balance- fair  Dynamic sitting balance- poor  Static standing balance- poor    Worked to improve reactive postural responses: stable surface, sitting at edge of bed, 30 minutes, weight shift, reach out of base of support, bilaterally.   Required CGA at first and then min A as pt fatigued and tactile/verbal cues for safety and technique.     Activity Tolerance:   - motivated to increase activity    Therapeutic Exercises:   none this session      Vitals:   Vitals stable throughout session    Final Location: Patient positioned in bed, all needs within reach, agrees to call for assistance, (+) bed/chair exit alarm, nursing staff notified    Alfonso CHRISTELLA Lease, OTA  February 18, 2021

## 2021-02-18 NOTE — Progress Notes (Signed)
Progress Note             Daily Progress Note: 02/18/2021    Assessment/Plan:        Left facial droop, weakness left arm>leg about the same.   Dysarthria slightly better.   Confused; stated she was ambulating in halls last night with son.   Denies recurrence of hallucinations.   No cardiopulmonary c/o.    Right abdominal discomfort better per patient.       Afebrile.   VSS.     AF w CVR, PVCs.        Lungs clear.     Large hematoma right flank/abdomen/groin.     Motor strength left arm/leg stable still +4/5 but worse, left facial droop more pronounced.         S/p acute R MCA CVA, likely sec A Fib.   Had been off anticoagulation due to high fall risk, multiple falls, multifactorial.       -s/p emergent thrombectomy.   Much improved neuro status compared to admission.       -   Worsened left-sided weakness, left facial droop.   May be recurrence of right MCA CVA.   Anticoagulation contraindicated at present.   Not candidate for aggressive interventions.   Discussed with patient and will discuss with son.     -   Recheck CBC, transfuse if needed, check ammonia level.   Empirically resume Rifaxamin, Lactulose.     -   Re-consult speech.  2.   Large right hematoma with acute blood loss anemia, s/p transfusion PRBCs x 2.   Last dose Eliquis 11/28 am and H/H stabilized 11/30.   No S/S infection, monitor.     3.   Anticoagulation DCd sec #2.   May be possible to resume low dose Eliquis in a few weeks but bleeding risk high at present.      4.   Chronic AF, CVR.  5.   Chr diastolic CHF, EF 18% (2/99/37).  6.   Hypothyroidism.   Resume replacement.  7.   RA.   On Plaquenil.     8.   Cirrhosis of liver prob sec hemochromatosis (+/- Methotrexate).   Ammonia nl.   DC Rifaxamin.  9.   Hepatic encephalopathy with hallucinations +/- superimposed Lewy Body dementia.  10.   DNR.  11.   SCDs for DVT prophylaxis.  12.   Hypomagnesemia, replace.  13.   HTN, meds on hold.    Tentative transfer rehab tomorrow if remains  stable.  Discussed with patient's son per telephone.       Subjective:      Review of Systems:  ROS    Objective:   Physical Exam: Physical Exam  Cardiovascular:      Rate and Rhythm: Normal rate. Rhythm irregular.   Pulmonary:      Breath sounds: Normal breath sounds.   Abdominal:      General: There is no distension.      Palpations: Abdomen is soft.      Comments: Hernia not distended.   Musculoskeletal:      Right lower leg: No edema.      Left lower leg: No edema.   Skin:     Comments: Large right flank/abdomen hematoma.   Neurological:      Mental Status: She is alert.       Visit Vitals  BP 129/70 (BP 1 Location: Right upper arm, BP Patient Position: Supine)   Pulse 83  Temp 97.9 ??F (36.6 ??C)   Resp 16   Ht 5' 3" (1.6 m)   Wt 71.7 kg (158 lb 1.1 oz)   SpO2 99%   BMI 28.00 kg/m??      O2 Device: None (Room air)    Temp (24hrs), Avg:98 ??F (36.7 ??C), Min:97.5 ??F (36.4 ??C), Max:98.4 ??F (36.9 ??C)    No intake/output data recorded.   12/04 1901 - 12/06 0700  In: -   Out: 700 [Urine:700]      Data Review:       24 Hour Results:  Recent Results (from the past 24 hour(s))   HEPATIC FUNCTION PANEL    Collection Time: 02/17/21 10:55 AM   Result Value Ref Range    AST (SGOT) 20.0 0.0 - 33.9 U/L    ALT (SGPT) <7 (L) 10 - 49 U/L    Alk. phosphatase 87 46 - 116 U/L    Bilirubin, total 3.10 (H) 0.30 - 1.20 mg/dl    Protein, total 5.4 (L) 5.7 - 8.2 gm/dl    Albumin 2.4 (L) 3.4 - 5.0 gm/dl    Bilirubin, direct 1.5 (H) 0.0 - 0.3 mg/dl   CBC WITH AUTOMATED DIFF    Collection Time: 02/17/21 10:56 AM   Result Value Ref Range    WBC 8.5 4.0 - 11.0 1000/mm3    RBC 2.82 (L) 3.60 - 5.20 M/uL    HGB 8.5 (L) 13.0 - 17.2 gm/dl    HCT 26.5 (L) 37.0 - 50.0 %    MCV 94.0 80.0 - 98.0 fL    MCH 30.1 25.4 - 34.6 pg    MCHC 32.1 30.0 - 36.0 gm/dl    PLATELET 337 140 - 450 1000/mm3    MPV 9.7 6.0 - 10.0 fL    RDW-SD 53.4 (H) 36.4 - 46.3      NRBC 0 0 - 0      IMMATURE GRANULOCYTES 0.4 0.0 - 3.0 %    NEUTROPHILS 74.5 (H) 34 - 64 %    LYMPHOCYTES  15.1 (L) 28 - 48 %    MONOCYTES 7.2 1 - 13 %    EOSINOPHILS 2.4 0 - 5 %    BASOPHILS 0.4 0 - 3 %   METABOLIC PANEL, BASIC    Collection Time: 02/17/21 10:56 AM   Result Value Ref Range    Potassium 3.1 (L) 3.5 - 5.1 mEq/L    Chloride 109 (H) 98 - 107 mEq/L    Sodium 140 136 - 145 mEq/L    CO2 24 20 - 31 mEq/L    Glucose 119 (H) 74 - 106 mg/dl    BUN 9 9 - 23 mg/dl    Creatinine 0.48 (L) 0.55 - 1.02 mg/dl    GFR est AA >60.0      GFR est non-AA >60      Calcium 8.5 (L) 8.7 - 10.4 mg/dl    Anion gap 7 5 - 15 mmol/L   MAGNESIUM    Collection Time: 02/17/21 10:56 AM   Result Value Ref Range    Magnesium 1.6 1.6 - 2.6 mg/dL   AMMONIA    Collection Time: 02/17/21 10:56 AM   Result Value Ref Range    Ammonia, plasma 49.0 (H) 11.2 - 31.7 umol/L   PROTHROMBIN TIME + INR    Collection Time: 02/17/21 10:56 AM   Result Value Ref Range    Prothrombin time 15.3 (H) 10.2 - 12.9 seconds    INR 1.3 (H) 0.1 -  1.1     BILIRUBIN, DIRECT    Collection Time: 02/17/21 10:56 AM   Result Value Ref Range    Bilirubin, direct 1.5 (H) 0.0 - 0.3 mg/dl         Problem List:  Problem List as of 02/18/2021 Date Reviewed: 02-12-2021            Codes Class Noted - Resolved    HQION-62 ICD-10-CM: U07.1  ICD-9-CM: 079.89  10/21/2020 - Present    Overview Signed 11/01/2020  1:27 PM by Lillia Dallas, MD     10/21/20 SARS CoV-2 detected             Stroke [facility]) ICD-10-CM: I63.9  ICD-9-CM: 434.91  02-12-21 - Present        CVA (cerebral vascular accident) [facility]) ICD-10-CM: I63.9  ICD-9-CM: 434.91  February 12, 2021 - Present        Atrial fibrillation ([place]) ICD-10-CM: I48.91  ICD-9-CM: 427.31  10/31/2020 - Present    Overview Addendum 11/06/2020 11:15 AM by Lillia Dallas, MD     Noted this AM (10/31/20)  Rate controlled (84 BPM)  No previously documented hx of AFIB  CHADS2VASc = 6  (08/27/17 CT Acute interhemispheric fissure subdural hemorrhage. Chronic small vessel ischemic change)  ECHO:  EF 75%< LV 25/44, S/PW 9/10, LAVI 28, Mild MR, Mod TR, RVSP 43              Other partial intestinal obstruction ([place]) ICD-10-CM: K56.690  ICD-9-CM: 560.89  10/31/2020 - Present    Overview Addendum 11/01/2020  1:28 PM by Lillia Dallas, MD     10/30/20 CT Small bowel obstruction secondary to presumed small bowel containing  umbilical hernia. Stranding within the hernia sac suggesting engorgement. No  pneumatosis.  11/01/20 SURG We will continue to follow the patient to ensure she does not develop worsening incarceration and bowel obstruction from the hernia.  Currently if we can delay the surgery she would likely benefit from decreased risk of postoperative complications             Metabolic encephalopathy XBM-84-XL: G93.41  ICD-9-CM: 348.31  10/21/2020 - Present        Contusion of left hip ICD-10-CM: K44.01UU  ICD-9-CM: 924.01  10/21/2020 - Present        Left elbow contusion ICD-10-CM: S50.02XA  ICD-9-CM: 923.11  10/21/2020 - Present        Multiple falls ICD-10-CM: R29.6  ICD-9-CM: V15.88  10/21/2020 - Present        Deep venous thrombosis (HCC) ICD-10-CM: I82.409  ICD-9-CM: 453.40  Unknown - Present        Subdural hematoma caused by concussion ICD-10-CM: S06.5XAA  ICD-9-CM: 852.29  08/27/2017 - Present        Subdural hematoma ICD-10-CM: S06.5XAA  ICD-9-CM: 432.1  08/27/2017 - Present       Medications reviewed  Current Facility-Administered Medications   Medication Dose Route Frequency    magnesium oxide (MAG-OX) tablet 400 mg  400 mg Oral DAILY    potassium chloride (KAON 10%) 20 mEq/15 mL oral liquid 40 mEq  40 mEq Oral Q4H    rifAXIMin (XIFAXAN) tablet 550 mg  550 mg Oral BID    lactulose (CHRONULAC) 10 gram/15 mL solution 30 mL  20 g Oral DAILY    hydrOXYzine HCL (ATARAX) tablet 25 mg  25 mg Oral TID PRN    polyethylene glycol (MIRALAX) packet 17 g  17 g Oral DAILY    0.9% sodium chloride infusion 250 mL  250 mL IntraVENous PRN    promethazine (PHENERGAN) 12.5 mg in NS 50 mL IVPB  12.5 mg IntraVENous Q6H PRN    atorvastatin (LIPITOR) tablet 40 mg  40 mg Oral QHS    acetaminophen (TYLENOL)  tablet 1,000 mg  1,000 mg Oral Q6H PRN    saline peripheral flush soln 10-20 mL  10-20 mL IntraVENous PRN    [Held by provider] apixaban (ELIQUIS) tablet 5 mg  5 mg Oral BID    albuterol-ipratropium (DUO-NEB) 2.5 MG-0.5 MG/3 ML  3 mL Nebulization Q4H PRN    [Held by provider] furosemide (LASIX) tablet 20 mg  20 mg Oral DAILY    hydrOXYchloroQUINE (PLAQUENIL) tablet 200 mg  200 mg Oral BID    levothyroxine (SYNTHROID) tablet 125 mcg  125 mcg Oral ACB    [Held by provider] lisinopriL (PRINIVIL, ZESTRIL) tablet 10 mg  10 mg Oral DAILY    [Held by provider] metoprolol tartrate (LOPRESSOR) tablet 25 mg  25 mg Oral Q8H    [Held by provider] potassium chloride (K-DUR, KLOR-CON M20) SR tablet 40 mEq  40 mEq Oral DAILY       Care Plan discussed with: Patient/Family    Total time spent with patient: 30 minutes.    Colen Darling, MD  February 18, 2021

## 2021-02-18 NOTE — Progress Notes (Signed)
Progress Notes by Tora Duck, PT at 19/37/90 2409                Author: Tora Duck, PT  Service: Physical Therapy  Author Type: Physical Therapist       Filed: 02/18/21 0946  Date of Service: 02/18/21 7353  Status: Signed          Editor: Tora Duck, PT (Physical Therapist)                  PHYSICAL THERAPY REASSESSMENT:                 Patient: [name] [name] (87 y.o. female)   Room: 6628/6628   '[x]'$   Patient DOB Verified      Date of Admission: 02/06/2021    Length of Stay:  12 day(s)   Primary Diagnosis: Stroke [facility]) [I63.9]   CVA (cerebral vascular accident) ([place]) [I63.9]   Atrial fibrillation ([place]) [I48.91]   Procedure(s) (LRB):   Procedure Cancelled/Not Performed - ESOPHAGOGASTRODUODENOSCOPY (EGD) (N/A) 11 Days Post-Op    Insurance: Payor: [facility] MED[facility] / Plan: [place] / Product Type: Medicare /        Date: 02/18/2021   In time: 8:22  Out time:  9:21         Precautions: Falls. L sided weakness/pain, hallucinations, confusion, nectar thick liquids          Isolation:   There are currently no Active Isolations       MDRO: No active infections          Assessment:      Per chart review: 87 yr old female presented to ED after falling out of bed at ALF and presenting with L sided weakness and R gaze preference. She s/p mechanical thrombectomy for  R MCA.  Pt has had complications of developing a large hematoma in her R thorax and pt was on bed rest for a few days.        Based on the objective data described below, the patient presents with   - increasing independence with functional mobility tasks   - decreased tolerance to sustained activity affecting function   - deconditioning   - generalized muscle weakness affecting function   - L sided weakness   - OOB transfer with assist x 2 with [name] stedy   - unable to ambulate   - complaint of L UE pain   - decreased cognition    - confusion   - pleasantly confused but cooperative and able to follow simple commands   - patient is  motivated to increase activity   - making progress towards PT goals   Patient is below baseline functional mobility and will benefit from skilled PT intervention in the acute care setting to address the abovementioned impairments.   Modified Rankin Score (mRS): 4 - Severe disability; unable to walk without assistance and unable to attend to own bodily needs without  assistance       Functional Status Score for the Intensive Care Unit (FSS-ICU)      Task   Score     1.     Rolling  2 - Max assist (patient performs 25% or less of the work)        2. Supine to Sit Transfer  2 - Max assist (patient performs 25% or less of the work)     3. Sit to Stand Transfer  2 - Max assist (patient  performs 25% or less of the work)     4. Sitting Edge of Bed  4 - Min assist (patient performs 75% or more of the work)     5. Walking  0 - Unable to attempt due to weakness     TOTAL SCORE:  10        INITIAL TOTAL SCORE:  8              Patients rehabilitation potential for below stated goals: Good.        Recommendations:   Recommend continued physical therapy during acute stay. Physical Therapy, Occupational Therapy,  and Speech Therapy. Recommend out of bed activity to counteract ill effects of bedrest, with assistance from staff as needed.   Discharge Recommendations:    Skilled nursing facility (SNF): patient will benefit from further therapy at rehab facility to increase strength and endurance to return to prior level of function.   Further Equipment Recommendations for Discharge:    DME needs to be determined at time of d/c from rehab.          Plan:         Patient will benefit from skilled Physical Therapy intervention to address the above impairments to return to prior level of function. Patient will be seen 3-5 times/week for 1 week      Patient will achieve PT goals in 1 week. Goals were created with input from the patient.        Physical Therapy Goals:     Continue all previous goals into next reporting period as pt has  made progress toward but has not met any goals.    -Patient will be mod. assist with bed mobility in preparation for EOB activities.   - Patient will be mod. assist with transfers in preparation for OOB activities and ambulation.   - Patient will tolerate sitting up in chair/chair positioning for at least 60 minutes in order to improve sitting tolerance.   - Patient will ambulate with moderate assistance  for 5 feet with the least restrictive device to promote functional independence at next level of care    - Patient will demonstrate good balance to safely enable upright activities and reduce risk for falls.   - Patient will demonstrate good activity tolerance during functional activities.   - Patient will demonstrate good safety awareness during functional activities.   - Patient will improve FSS-ICU score to at least 13 in order to indicate improved functional independence.   - Patient will be supervision with lower extremity home exercise program to increase strength and endurance.   - Patient will utilize energy conservation techniques during functional activities.        Planned interventions:         Skilled Physical Therapy services will provide functional mobility training, therapeutic exercises, therapeutic activities, patient/caregiver education as indicated.   Skilled Physical Therapy services will modify and progress therapeutic interventions, address functional mobility deficits, address ROM and strength deficits, analyze and cue movement  patterns and assess and modify postural abnormalities to reach the stated goals.        Subjective:         Patient agreeable to work with PT.   Patient reports 0/10 pain before treatment and significant L UE pain with movement , no pain at conclusion of treatment.        Objective Data Summary:         Orders, labs, and chart reviewed on [name] [name].  Communicated with patient's nurse (patient ok to be seen by PT), care partner, pt was seen in conjunction with OT  to maximize pt safety and participation .       Present illness history:      Patient Active Problem List           Diagnosis  Date Noted         ?  COVID-19  10/21/2020     ?  Stroke [facility])  02/06/2021     ?  CVA (cerebral vascular accident) ([place])  02/06/2021     ?  Atrial fibrillation ([place])  10/31/2020     ?  Other partial intestinal obstruction ([place])  10/31/2020     ?  Metabolic encephalopathy  84/16/6063     ?  Contusion of left hip  10/21/2020     ?  Left elbow contusion  10/21/2020     ?  Multiple falls  10/21/2020     ?  Subdural hematoma caused by concussion  08/27/2017     ?  Subdural hematoma  08/27/2017         ?  Deep venous thrombosis (HCC)           Previous medical history:      Past Medical History:        Diagnosis  Date         ?  Arthritis       ?  Deep venous thrombosis ([place])       ?  Endocrine disease            hypothyroid         ?  Hemochromatosis           ?  Hypertension                Patient found:        Semi reclined in bed. (+) bed alarm. (+) telemetry.        Cognitive Status:        Mental Status: Alert and oriented to self.  Not oriented to place, time.  Having some visual and auditory hallucinations during session   Communication: patient interacts appropriately   Follows commands: intact; consistently follows commands   General Cognition: impaired, impaired short term memory may prevent retention of education   Safety/Judgement: appropriate awareness of environment and need for assistance, is aware that she has hallucinations         Extremities Assessment:         Sensation:   Intact      Strength:     R LE grossly 3+/5 throughout   L LE grossly 1/5      Range Of Motion:   WFL R Le   L LE knee flexion limited to ~ 50 degrees hip to ~ 75 degrees   May have difficulty using Sara-steady to get pt out of chair due to decreased ROM in L LE        Therapeutic Activities; Functional Mobility and Balance Status:         Focus on functional mobility training, bed mobility training,  transfer training, balance training, therapeutic activities, neuro muscular re-education. Provided cueing for hand placement, technique, and safety.             Bed mobility:      Functional Status     Comments         Supine to sit  mod/max assist/2 person assist  Sit to supine  max assist/2 person assist           Scooting  max assist/2 person assist                 Transfers:      Functional Status     Comments         Sit to stand  min/mod assist from elevated bed with pt pulling on sara -steady bar with R UE       Stand to sit  min assist                              Functional Balance  Sitting            Static sitting    Fair static: patient able to maintain balance  with handhold support; requires occasional minimal assistance         Dynamic sitting    Poor static: patient requires handhold support and moderate to maximal assistance to maintain position            Functional Balance Standing           Static standing    poor (+): minimum assistance to maintain balance              Balance   activities   Worked to improve reactive postural responses: stable surface,  sitting at edge of bed, 30 minutes, weight shift, reach out of base of support, bilaterally.     Required CGA at first and then min A as pt fatigued and tactile/verbal cues for safety and technique.           Activity Tolerance:        - improving tolerance to activity during PT session   - vitals stable   - no apparent distress        Final Location:        Positioned in bed, all needs within reach. Patient agrees to call for assistance. (+) bed alarm. (+) nurse notified. (+) care partner notified.        Communication/Education:        Education: activity as tolerated to counteract ill effects of bedrest, promote healing and return to prior level of function, call staff for assistance,  safety, disposition, role of PT, PT plan of care, all questions answered.      Education provided to: patient    Opportunity for questions and  clarification was provided.      Readiness to learn indicated by: verbalized understanding and needs reinforcement      Barriers to learning/limitations:  Yes;  baseline dementia      Comprehension: Patient communicated comprehension         Thank you for this referral.   Tora Duck, PT, DPT

## 2021-02-19 LAB — METABOLIC PANEL, BASIC
Anion gap: 9 mmol/L (ref 5–15)
BUN: 7 mg/dl — ABNORMAL LOW (ref 9–23)
CO2: 24 mEq/L (ref 20–31)
Calcium: 8.3 mg/dl — ABNORMAL LOW (ref 8.7–10.4)
Chloride: 112 mEq/L — ABNORMAL HIGH (ref 98–107)
Creatinine: 0.46 mg/dl — ABNORMAL LOW (ref 0.55–1.02)
GFR est AA: >60
GFR est non-AA: >60
Glucose: 87 mg/dl (ref 74–106)
Potassium: 3.1 mEq/L — ABNORMAL LOW (ref 3.5–5.1)
Sodium: 145 mEq/L (ref 136–145)

## 2021-02-19 LAB — CBC WITH AUTOMATED DIFF
BASOPHILS: 0.5 % (ref 0–3)
EOSINOPHILS: 3.1 % (ref 0–5)
HCT: 25.7 % — ABNORMAL LOW (ref 37.0–50.0)
HGB: 8.4 gm/dl — ABNORMAL LOW (ref 13.0–17.2)
IMMATURE GRANULOCYTES: 0.3 % (ref 0.0–3.0)
LYMPHOCYTES: 24.8 % — ABNORMAL LOW (ref 28–48)
MCH: 30.8 pg (ref 25.4–34.6)
MCHC: 32.7 gm/dl (ref 30.0–36.0)
MCV: 94.1 fL (ref 80.0–98.0)
MONOCYTES: 10.8 % (ref 1–13)
MPV: 9.4 fL (ref 6.0–10.0)
NEUTROPHILS: 60.5 % (ref 34–64)
NRBC: 0 (ref 0–0)
PLATELET: 365 10*3/uL (ref 140–450)
RBC: 2.73 M/uL — ABNORMAL LOW (ref 3.60–5.20)
RDW-SD: 55.2 — ABNORMAL HIGH (ref 36.4–46.3)
WBC: 7.7 10*3/uL (ref 4.0–11.0)

## 2021-02-19 LAB — MAGNESIUM
Magnesium: 1.7 mg/dL (ref 1.6–2.6)
Magnesium: 1.7 mg/dL (ref 1.6–2.6)

## 2021-02-19 LAB — CBC WITH AUTO DIFFERENTIAL
Basophils %: 0.5 % (ref 0–3)
Eosinophils %: 3.1 % (ref 0–5)
Hematocrit: 25.7 % — ABNORMAL LOW (ref 37.0–50.0)
Hemoglobin: 8.4 gm/dl — ABNORMAL LOW (ref 13.0–17.2)
Immature Granulocytes: 0.3 % (ref 0.0–3.0)
Lymphocytes %: 24.8 % — ABNORMAL LOW (ref 28–48)
MCH: 30.8 pg (ref 25.4–34.6)
MCHC: 32.7 gm/dl (ref 30.0–36.0)
MCV: 94.1 fL (ref 80.0–98.0)
MPV: 9.4 fL (ref 6.0–10.0)
Monocytes %: 10.8 % (ref 1–13)
Neutrophils %: 60.5 % (ref 34–64)
Nucleated RBCs: 0 (ref 0–0)
Platelets: 365 10*3/uL (ref 140–450)
RBC: 2.73 M/uL — ABNORMAL LOW (ref 3.60–5.20)
RDW-SD: 55.2 — ABNORMAL HIGH (ref 36.4–46.3)
WBC: 7.7 10*3/uL (ref 4.0–11.0)

## 2021-02-19 LAB — BASIC METABOLIC PANEL
Anion Gap: 9 mmol/L (ref 5–15)
BUN: 7 mg/dl — ABNORMAL LOW (ref 9–23)
CO2: 24 mEq/L (ref 20–31)
Calcium: 8.3 mg/dl — ABNORMAL LOW (ref 8.7–10.4)
Chloride: 112 mEq/L — ABNORMAL HIGH (ref 98–107)
Creatinine: 0.46 mg/dl — ABNORMAL LOW (ref 0.55–1.02)
EGFR IF NonAfrican American: >60
GFR African American: >60
Glucose: 87 mg/dl (ref 74–106)
Potassium: 3.1 mEq/L — ABNORMAL LOW (ref 3.5–5.1)
Sodium: 145 mEq/L (ref 136–145)

## 2021-02-19 MED ORDER — POTASSIUM CHLORIDE SR 20 MEQ TAB, PARTICLES/CRYSTALS
20 mEq | Freq: Once | ORAL | Status: AC
Start: 2021-02-19 — End: 2021-02-19
  Administered 2021-02-19: 15:00:00 via ORAL

## 2021-02-19 MED FILL — HYDROXYCHLOROQUINE 200 MG TAB: 200 mg | ORAL | Qty: 1

## 2021-02-19 MED FILL — ACETAMINOPHEN 500 MG TAB: 500 mg | ORAL | Qty: 2

## 2021-02-19 MED FILL — POTASSIUM CHLORIDE SR 20 MEQ TAB, PARTICLES/CRYSTALS: 20 mEq | ORAL | Qty: 2

## 2021-02-19 MED FILL — LACTULOSE 20 GRAM/30 ML ORAL SOLUTION: 20 gram/30 mL | ORAL | Qty: 30

## 2021-02-19 MED FILL — POLYETHYLENE GLYCOL 3350 17 GRAM (100 %) ORAL POWDER PACKET: 17 gram | ORAL | Qty: 1

## 2021-02-19 MED FILL — HYDROXYZINE 25 MG TAB: 25 mg | ORAL | Qty: 1

## 2021-02-19 MED FILL — LEVOTHYROXINE 125 MCG TAB: 125 mcg | ORAL | Qty: 1

## 2021-02-19 MED FILL — ATORVASTATIN 40 MG TAB: 40 mg | ORAL | Qty: 1

## 2021-02-19 MED FILL — XIFAXAN 550 MG TABLET: 550 mg | ORAL | Qty: 1

## 2021-02-19 MED FILL — MAGNESIUM OXIDE 400 MG TAB: 400 mg | ORAL | Qty: 1

## 2021-02-19 NOTE — Progress Notes (Signed)
CM following    Plan is [facility] once medically stable for dc. They have a bed avail today

## 2021-02-19 NOTE — Discharge Summary (Signed)
[facility]  Transfer Summary  NAME:  [name] [name], [name] [name]  SEX:   F  ADMIT: 02/06/2021  DISCH:   DOB: 1934-02-29  MR#    [id number]  ACCT#  [id number]    cc: Eon Zunker DO;   Leodis Binet MD;   Concepcion Elk MD      DATE OF ADMISSION:   02/06/2021     TENTATIVE DATE OF TRANSFER:   02/19/2021     DISCHARGE DIAGNOSES:   1.  Status post acute right middle cerebral artery thromboembolic cerebrovascular accident (CVA), likely secondary to atrial fibrillation.  The patient is not anticoagulated due to high fall risk.  2.  Status post emergent thrombectomy.  3.  Large right abdominal wall hematoma causing acute blood loss anemia requiring transfusion of 2 units packed red blood cells.  Anticoagulation contraindicated at least in the moderate term.  4.  Chronic atrial fibrillation.  5.  Chronic diastolic congestive heart failure, ejection fraction 75% from an echo from August of this year.  6.  Hypothyroidism, on replacement therapy.  7.  Rheumatoid arthritis.  8.  Cirrhosis of the liver, likely secondary to hemochromatosis with hepatic encephalopathy and intermittent hallucinations.  9.  Possible superimposed Lewy Body dementia contributing to hallucinations.  10.  The patient is DO NOT RESUSCITATE.  11.  Hypertension, prior to admission.  Currently, normotensive off medications.     HISTORY OF PRESENT ILLNESS:   Please see H&P for details.  Briefly, the patient is an 87 year old female, resident of an assisted living facility with a history of atrial fibrillation, not on anticoagulation due to very high fall risk, who fell out of bed the morning of admission was noted to have left sided weakness and a left facial droop.  The patient was brought to the ER for evaluation.  She had a CTA of her head and neck and was found to have severe stenosis of the right distal M1 MCA and perfusion mismatch in the right MCA distribution.  CT of the head was negative.     The patient was seen in consultation by tele-neurology and  neurosurgery and a decision was made to pursue thrombectomy.  The patient underwent emergent right MCA thrombectomy and was admitted to the intensive care unit.     HOSPITAL COURSE:   The patient had significant improvement in her left sided weakness and neglect initially.  The patient was placed on anticoagulation.  Unfortunately, the patient developed a large hematoma involving the right abdominal wall measuring 12 x 17 x 24 cm with mild mass effect on the surrounding structures.  The patient's blood counts dropped to a low of 6.6 on 02/10/2021.  The patient's anticoagulation was discontinued and she received a total of 2 units of packed red blood cells.  Hemoglobin this morning is 8.4, hematocrit 25.7, which is stable.     The patient maintained a controlled ventricular rate with her atrial fibrillation without requiring additional medications.  As stated above, her blood pressure is stable without any antihypertensive medications.     The patient had some worsening of her left sided weakness and recurrence of left facial droop noted first on 02/17/2021, Monday this week.  Again, anticoagulation is contraindicated for this patient at this time.  The dilemma of not being able to treat was discussed both with the patient and the patient's son and they demonstrates understanding.  The patient is off and on confused, but still has some understanding of her condition.     Also  early this week, the patient had recurrence of confusion and nonthreatening hallucinations.  Her ammonia level had crept up into the 40s range.  Her rifaximin and daily lactulose were resumed and the patient remains somewhat confused, but without hallucinations over the last couple of days.     DISCHARGE MEDICATIONS:   The patient is going to be transferred on the following medications:  DuoNeb q.4 hours p.r.n., Lipitor 40 mg in the evening, Plaquenil 200 mg b.i.d., lactulose 30 mL at bedtime, Synthroid 125 mcg daily, mag oxide 400 mg daily,  rifaximin 550 mg b.i.d., Tylenol p.r.n.  Of note, her preadmission Lasix 20 mg, lisinopril 10 mg, Toprol 25 mg, potassium are all held.  This morning, her potassium is a little low at 3.1, magnesium a little low at 1.7.  She is going to receive replacement doses today, but will need to follow up on these values.      ___________________  [name] DO   Dictated BD:ZHGDJM L. Kathelene Rumberger, DO  SB  D: 02/19/2021 09:55:51  T: 02/19/2021 10:22:56  [id number]

## 2021-02-19 NOTE — Progress Notes (Signed)
Discharge Plan:   [name] [name]    Discharge Date:     02/19/2021     The facility confirmed that they are ready to receive the patient:    yes, keith    Transportation: Haematologist    MEDICAL    Transport Time:    1500    Family, MD, patient, nurse and facility aware of pickup time    yes, chris

## 2021-03-12 ENCOUNTER — Inpatient Hospital Stay
Admit: 2021-03-12 | Discharge: 2021-03-12 | Disposition: A | Discharge status: Home / Self Care | Payer: MEDICARE | Attending: Emergency Medicine

## 2021-03-12 ENCOUNTER — Emergency Department: Admit: 2021-03-12 | Payer: MEDICARE | Primary: Internal Medicine

## 2021-03-12 DIAGNOSIS — I4891 Unspecified atrial fibrillation: Secondary | ICD-10-CM

## 2021-03-12 LAB — EKG, 12 LEAD, INITIAL
Calculated R Axis: -71 degrees
Calculated T Axis: -37 degrees
Q-T Interval: 276 ms
QRS Duration: 72 ms
QTC Calculation (Bezet): 404 ms
Ventricular Rate: 129 {beats}/min

## 2021-03-12 LAB — TROPONIN-HIGH SENSITIVITY
Troponin-High Sensitivity: 435 ng/L — CR (ref 0–34)
Troponin-High Sensitivity: 707 ng/L — CR (ref 0–34)

## 2021-03-12 LAB — CBC WITH AUTOMATED DIFF
BASOPHILS: 0.3 % (ref 0–3)
EOSINOPHILS: 0 % (ref 0–5)
HCT: 36 % — ABNORMAL LOW (ref 37.0–50.0)
HGB: 11.1 gm/dl — ABNORMAL LOW (ref 13.0–17.2)
IMMATURE GRANULOCYTES: 0.5 % (ref 0.0–3.0)
LYMPHOCYTES: 3.2 % — ABNORMAL LOW (ref 28–48)
MCH: 30.6 pg (ref 25.4–34.6)
MCHC: 30.8 gm/dl (ref 30.0–36.0)
MCV: 99.2 fL — ABNORMAL HIGH (ref 80.0–98.0)
MONOCYTES: 5.4 % (ref 1–13)
MPV: 10.6 fL — ABNORMAL HIGH (ref 6.0–10.0)
NEUTROPHILS: 90.6 % — ABNORMAL HIGH (ref 34–64)
NRBC: 0 (ref 0–0)
PLATELET: 215 10*3/uL (ref 140–450)
RBC: 3.63 M/uL (ref 3.60–5.20)
RDW-SD: 64.7 — ABNORMAL HIGH (ref 36.4–46.3)
WBC: 10.9 10*3/uL (ref 4.0–11.0)

## 2021-03-12 LAB — METABOLIC PANEL, BASIC
Anion gap: 13 mmol/L (ref 5–15)
BUN: 12 mg/dl (ref 9–23)
CO2: 23 mEq/L (ref 20–31)
Calcium: 8.7 mg/dl (ref 8.7–10.4)
Chloride: 111 mEq/L — ABNORMAL HIGH (ref 98–107)
Creatinine: 0.69 mg/dl (ref 0.55–1.02)
GFR est AA: >60
GFR est non-AA: >60
Glucose: 127 mg/dl — ABNORMAL HIGH (ref 74–106)
Potassium: 3.3 mEq/L — ABNORMAL LOW (ref 3.5–5.1)
Sodium: 147 mEq/L — ABNORMAL HIGH (ref 136–145)

## 2021-03-12 LAB — SARS-COV-2_FLU_RSV
COVID-19: NEGATIVE
Influenza A PCR: POSITIVE — AB
Influenza B PCR: NEGATIVE
RSV by PCR: NEGATIVE

## 2021-03-12 LAB — NT-PRO BNP: NT pro-BNP: 9803 — ABNORMAL HIGH (ref 0–450)

## 2021-03-12 LAB — PROCALCITONIN
PROCALCITONIN: 2.84 ng/ml — ABNORMAL HIGH (ref 0.00–0.50)
PROCALCITONIN: 2.84 ng/ml — ABNORMAL HIGH (ref 0.00–0.50)

## 2021-03-12 LAB — MAGNESIUM
Magnesium: 1.9 mg/dL (ref 1.6–2.6)
Magnesium: 1.9 mg/dL (ref 1.6–2.6)

## 2021-03-12 LAB — LACTIC ACID
LACTIC ACID: 3.1 mmol/L — ABNORMAL HIGH (ref 0.5–2.2)
Lactic Acid: 3.1 mmol/L — ABNORMAL HIGH (ref 0.5–2.2)

## 2021-03-12 LAB — PROBNP, N-TERMINAL: BNP: 9803 — ABNORMAL HIGH (ref 0–450)

## 2021-03-12 LAB — EKG 12-LEAD
Q-T Interval: 276 ms
QRS Duration: 72 ms
QTc Calculation (Bazett): 404 ms
R Axis: -71 degrees
T Axis: -37 degrees
Ventricular Rate: 129 {beats}/min

## 2021-03-12 LAB — CBC WITH AUTO DIFFERENTIAL
Basophils %: 0.3 % (ref 0–3)
Eosinophils %: 0 % (ref 0–5)
Hematocrit: 36 % — ABNORMAL LOW (ref 37.0–50.0)
Hemoglobin: 11.1 gm/dl — ABNORMAL LOW (ref 13.0–17.2)
Immature Granulocytes: 0.5 % (ref 0.0–3.0)
Lymphocytes %: 3.2 % — ABNORMAL LOW (ref 28–48)
MCH: 30.6 pg (ref 25.4–34.6)
MCHC: 30.8 gm/dl (ref 30.0–36.0)
MCV: 99.2 fL — ABNORMAL HIGH (ref 80.0–98.0)
MPV: 10.6 fL — ABNORMAL HIGH (ref 6.0–10.0)
Monocytes %: 5.4 % (ref 1–13)
Neutrophils %: 90.6 % — ABNORMAL HIGH (ref 34–64)
Nucleated RBCs: 0 (ref 0–0)
Platelets: 215 10*3/uL (ref 140–450)
RBC: 3.63 M/uL (ref 3.60–5.20)
RDW-SD: 64.7 — ABNORMAL HIGH (ref 36.4–46.3)
WBC: 10.9 10*3/uL (ref 4.0–11.0)

## 2021-03-12 LAB — BASIC METABOLIC PANEL
Anion Gap: 13 mmol/L (ref 5–15)
BUN: 12 mg/dl (ref 9–23)
CO2: 23 mEq/L (ref 20–31)
Calcium: 8.7 mg/dl (ref 8.7–10.4)
Chloride: 111 mEq/L — ABNORMAL HIGH (ref 98–107)
Creatinine: 0.69 mg/dl (ref 0.55–1.02)
EGFR IF NonAfrican American: >60
GFR African American: >60
Glucose: 127 mg/dl — ABNORMAL HIGH (ref 74–106)
Potassium: 3.3 mEq/L — ABNORMAL LOW (ref 3.5–5.1)
Sodium: 147 mEq/L — ABNORMAL HIGH (ref 136–145)

## 2021-03-12 LAB — TROPONIN, HIGH SENSITIVITY
Troponin, High Sensitivity: 435 ng/L (ref 0–34)
Troponin, High Sensitivity: 707 ng/L (ref 0–34)

## 2021-03-12 MED ORDER — MORPHINE CONCENTRATE 20 MG/ML ORAL SYRINGE  (FOR ORAL USE ONLY)
20 mg/mL | ORAL | 0 refills | Status: AC | PRN
Start: 2021-03-12 — End: 2021-03-27

## 2021-03-12 MED ORDER — LORAZEPAM 2 MG/ML IJ SOLN
2 mg/mL | Freq: Once | INTRAMUSCULAR | Status: AC
Start: 2021-03-12 — End: 2021-03-12
  Administered 2021-03-12: 20:00:00 via INTRAVENOUS

## 2021-03-12 MED ORDER — NITROGLYCERIN 2 % TRANSDERMAL OINTMENT
2 % | Freq: Once | TRANSDERMAL | Status: AC
Start: 2021-03-12 — End: 2021-03-12
  Administered 2021-03-12: 13:00:00 via TOPICAL

## 2021-03-12 MED ORDER — LORAZEPAM 2 MG/ML ORAL CONCENTRATE
2 mg/mL | ORAL | 0 refills | Status: AC | PRN
Start: 2021-03-12 — End: ?

## 2021-03-12 MED ORDER — BISACODYL 10 MG RECTAL SUPPOSITORY
10 mg | Freq: Every day | RECTAL | 0 refills | Status: AC | PRN
Start: 2021-03-12 — End: ?

## 2021-03-12 MED ORDER — ACETAMINOPHEN 650 MG RECTAL SUPPOSITORY
650 mg | Freq: Four times a day (QID) | RECTAL | 0 refills | Status: AC | PRN
Start: 2021-03-12 — End: ?

## 2021-03-12 MED ORDER — MORPHINE 2 MG/ML INJECTION
2 mg/mL | INTRAMUSCULAR | Status: AC
Start: 2021-03-12 — End: 2021-03-12
  Administered 2021-03-12: 20:00:00 via INTRAVENOUS

## 2021-03-12 MED ORDER — PROCHLORPERAZINE MALEATE 10 MG TAB
10 mg | ORAL_TABLET | Freq: Four times a day (QID) | ORAL | 0 refills | Status: AC | PRN
Start: 2021-03-12 — End: 2021-03-19

## 2021-03-12 MED ORDER — FUROSEMIDE 10 MG/ML IJ SOLN
10 mg/mL | Freq: Once | INTRAMUSCULAR | Status: AC
Start: 2021-03-12 — End: 2021-03-12
  Administered 2021-03-12: 13:00:00 via INTRAVENOUS

## 2021-03-12 MED ORDER — HYOSCYAMINE 0.125 MG SUBLINGUAL TAB
0.125 mg | ORAL_TABLET | SUBLINGUAL | 0 refills | Status: AC | PRN
Start: 2021-03-12 — End: ?

## 2021-03-12 MED ORDER — ONDANSETRON (PF) 4 MG/2 ML INJECTION
4 mg/2 mL | Freq: Once | INTRAMUSCULAR | Status: AC
Start: 2021-03-12 — End: 2021-03-12
  Administered 2021-03-12: 20:00:00 via INTRAVENOUS

## 2021-03-12 MED FILL — ONDANSETRON (PF) 4 MG/2 ML INJECTION: 4 mg/2 mL | INTRAMUSCULAR | Qty: 2

## 2021-03-12 MED FILL — LORAZEPAM 2 MG/ML IJ SOLN: 2 mg/mL | INTRAMUSCULAR | Qty: 1

## 2021-03-12 MED FILL — FUROSEMIDE 10 MG/ML IJ SOLN: 10 mg/mL | INTRAMUSCULAR | Qty: 4

## 2021-03-12 MED FILL — MORPHINE 2 MG/ML INJECTION: 2 mg/mL | INTRAMUSCULAR | Qty: 1

## 2021-03-12 MED FILL — NITRO-BID 2 % TRANSDERMAL OINTMENT: 2 % | TRANSDERMAL | Qty: 1

## 2021-03-12 NOTE — ED Notes (Signed)
0730 - Introduced self to pt. Pt sitting in bed with c/o congestion. Reports no pain. Call bell within reach.     0815 - Cleaned pt and changed linen. Pt hooked up to pure wick. Medicated. No requests at this time.     0915 - Pt resting comfortably in bed. Provided with drink. No other requests.    1000 - Hospice at bedside talking with family    1100 - Rounded on pt. States she feels less congested. No needs at this time. Son at bedside.     1200 - Pt resting in bed. Trying to cough up sputum. Crackles bilaterally.    1300 - Pt resting. Denies any needs    1400 - Pt denies any needs    1500 - Pts son returned. Stated she was in pain and requested pain medication, MD aware    1507 - Pt medicated. O2 98%.     1600 - Pt sleeping. No more signs of pain at this time.     1700 - Pt sleeping peacefully. No signs of pain at this time. Transport expected to be here in <1 hr. Son informed.

## 2021-03-12 NOTE — ED Notes (Signed)
Case management got her medications from the pharmacy, son holding onto medications until EMS arrives to transport patient.  Aware if needs to step out to make staff aware.

## 2021-03-12 NOTE — Progress Notes (Signed)
 Per Dr. Cleotilde patients son [place] (At bedside)  would perfer patient to return to [facility] on [facility]osp[facility].     Freedom of Choice Given- Per son I would like to use whatever company [facility] uses.     CM spoke with Burnard from [facility] [phone]. Per Burnard anticipate patient will be able to return today with [facility]

## 2021-03-12 NOTE — ED Notes (Signed)
Received report from Jared, RN.

## 2021-03-12 NOTE — ED Notes (Signed)
Pt comes in via EMS with c/o SOB. Upon arrival EMS states pts O2 was 88 on RA. She went up to 96 on 3L. Pt also was in AFIB with RVR per EMS.    Pt full code

## 2021-03-12 NOTE — ED Notes (Signed)
[facility]  Emergency ObservationDepartment   Discharge Summary    Patient: [name] [name] Age: 87 y.o. Sex: female    Date of Birth: 23-Jan-1934 Admit Date: 03/12/2021 PCP: [name], MD   MRN: [id number]  CSN: [id number]     Room: ER27/ER27       Date & Time of Observation Admission:  March 12, 2021 at 0 800  Date & Time of Observation Discharge:  March 12, 2021 1:13 PM    History of Present Illness   87 y.o. female who presented with acute heart failure and pulmonary edema or related to rapid A. fib.    She was placed in observation for symptom control well disposition decisions were made      ED Observation Course/MDM/Plan   She was given IV Lasix  She was given nitrates    She improved over time    He seems much more comfortable and calm      She was evaluated by our hospice service.  They were able to make arrangements for hospice care at home as per the son's wishes      I feel that he is fully aware of the decision he is making on behalf of his mother, I believe that he is comfortable with the intended consequences, I believe his motivations are pure and that they are in the best interest of the patient      MORE THAN 30 MINUTES OF TIME WAS SPENT ON DISCHARGE SERVICES  Physical Exam   Visit Vitals  BP 115/83   Pulse (!) 124   Temp 98.7 ??F (37.1 ??C)   Resp (!) 34   Ht 5\' 3"  (1.6 m)   Wt 70.8 kg (156 lb)   SpO2 97%   BMI 27.63 kg/m??        Physical Exam  Constitutional: Calm and not in distress  Pulmonary: Continues to have some scattered rhonchi  CV: Tachycardic and irregular  GI: Soft and nontender  Skin: Warm and dry  Musculoskeletal: Bilateral edema      Psych: Calm    Clinical Impression/diagnosis       ICD-10-CM ICD-9-CM   1. Atrial fibrillation with rapid ventricular response (HCC)  I48.91 427.31   2. Acute systolic heart failure (HCC)  428.21   3. Acute pulmonary edema (HCC)  J81.0 518.4   4. Acute respiratory failure with hypoxia (HCC)  J96.01 518.81   5. Comfort  measures only status  Z51.5 V66.7       Disposition      Current Discharge Medication List        START taking these medications    Details   acetaminophen (TYLENOL) 650 mg suppository Insert 1 Suppository into rectum every six (6) hours as needed for Fever or Pain.  Qty: 4 Suppository, Refills: 0  Start date: 03/12/2021      bisacodyL (DULCOLAX) 10 mg supp Insert 10 mg into rectum daily as needed for Constipation.  Qty: 2 Suppository, Refills: 0  Start date: 03/12/2021      hyoscyamine SL (LEVSIN/SL) 0.125 mg SL tablet 1 Tablet by SubLINGual route every four (4) hours as needed for Secretions.  Qty: 12 Tablet, Refills: 0  Start date: 03/12/2021      LORazepam (INTENSOL) 2 mg/mL concentrated solution Take 0.5 mL by mouth every four (4) hours as needed for Agitation or Anxiety (Refractory to non-pharm intervention). Max Daily Amount: 6 mg.  Qty: 30 mL, Refills: 0  Start date:  03/12/2021    Associated Diagnoses: Acute systolic heart failure (HCC)      morphine (ROXANOL) 20 mg/mL concentrated oral syringe Take 0.25 mL by mouth every two (2) hours as needed (moderate or severe pain or dyspnea) for up to 15 days. Max Daily Amount: 60 mg.  Qty: 30 mL, Refills: 0  Start date: 03/12/2021, End date: 03/27/2021    Associated Diagnoses: Acute systolic heart failure (HCC)      prochlorperazine (COMPAZINE) 10 mg tablet Take 1 Tablet by mouth every six (6) hours as needed for Nausea or Vomiting for up to 7 days.  Qty: 6 Tablet, Refills: 0  Start date: 03/12/2021, End date: 03/19/2021           STOP taking these medications       lactulose (CHRONULAC) 10 gram/15 mL solution Comments:   Reason for Stopping:         atorvastatin (LIPITOR) 40 mg tablet Comments:   Reason for Stopping:         acetaminophen (TYLENOL) 325 mg tablet Comments:   Reason for Stopping:         rifAXIMin (XIFAXAN) 550 mg tablet Comments:   Reason for Stopping:         magnesium oxide (MAG-OX) 400 mg tablet Comments:   Reason for Stopping:          hydroxychloroquine (PLAQUENIL) 200 mg tablet Comments:   Reason for Stopping:         levothyroxine (SYNTHROID) 125 mcg tablet Comments:   Reason for Stopping:                   I have answered all questions.  I have discussed my recommendations for follow-up and reasons to return.  I have encouraged early return should worrisome signs or symptoms develop or if there is any worsening or unanticipated persistence of their presenting condition.  I have discussed any limitations to their daily activities and the treatment plan for at home.        [name], MD  March 12, 2021      My signature above authenticates this document and my orders, the final    diagnosis (es), discharge prescription (s), and instructions in the Epic    record.  If you have any questions please contact [phone].     Nursing notes have been reviewed by the physician/ advanced practice    Clinician.    Dragon medical dictation software was used for portions of this report. Unintended voice recognition errors may occur.

## 2021-03-12 NOTE — Progress Notes (Signed)
[facility] aware of patient and will be arrange DME and SOC with Lyla Son from [place] and then notify CM.

## 2021-03-12 NOTE — ED Notes (Signed)
Repeat troponin 707    Dr. [name] aware

## 2021-03-12 NOTE — ED Notes (Signed)
Updated Bri from [facility] about pt status

## 2021-03-12 NOTE — ED Notes (Signed)
The patient is in observation status    She has remained stable    The hospice nurses have spoken with the son    It is still his intention to have her enter hospice and would like her to go back home    This is in the works and I will continue to look in on her    No express needs at this time    Patient Vitals for the past 4 hrs:   Temp Pulse Resp BP SpO2   03/12/21 0900 -- (!) 124 (!) 34 115/83 97 %   03/12/21 0755 -- -- -- -- 96 %   03/12/21 0739 -- (!) 126 30 124/84 95 %   03/12/21 0735 98.7 ??F (37.1 ??C) -- -- -- --

## 2021-03-12 NOTE — Progress Notes (Signed)
CM spoke with Lyla Son [phone] from [facility]. Able to accept is Hospice is arranged. Per Lyla Son will reach out to patients son to discuss.       Hospice- [facility] - Referral placed in CC link via Epic

## 2021-03-12 NOTE — ED Notes (Signed)
 ED Notes  by Horatio Laymon LABOR, RN at 03/12/21 1358                Author: Horatio Laymon LABOR, RN  Service: EMERGENCY  Author Type: Registered Nurse       Filed: 03/12/21 1402  Date of Service: 03/12/21 1358  Status: Signed          Editor: Horatio Laymon LABOR, RN (Registered Nurse)                    Medical Necessity Certification Statement for Non-Emergency Ambulance Services         SECTION 1 - GENERAL INFORMATION          Patient: [name] [name]  Age: 87 y.o.  Sex: female          Date of Birth: December 16, 1933  Admit Date: 03/12/2021  PCP: Haydee Debby CROME, MD         MRN: [id number]   CSN: [id number]   Room: ER27/ER27        Chief Complaint:      Chief Complaint       Patient presents with        ?  Shortness of Breath         Height/Weight: 70.8 kg Body mass index is 27.63 kg/m. Body mass index is 27.63 kg/m.   Weight:     Weight: 70.8 kg (156 lb)   Weight Source:        Transport From:  [x]  Hospital    [] SNF   [] Residence   []  Other:    Transport To:      [facility]    [] SNF   [] Residence   [x]  Other: Assistive living    Address:    [address], [address], [place] [address]      Medicare:      Insurance Information                                       [place] MED[place]/[facility] MEDICARE A & B  Phone:  --             Subscriber:  Liboria, Putnam  Subscriber#:  [id number]       Group#:  --  Precert#:  --                      [employer]/[facility] AARP SUPPLEMENTAL  Phone:  --             Subscriber:  Alyanah, Elliott  Subscriber#:  [id number]             Group#:  PLAN [id number]  Precert#:  --                   Transport Date: March 12, 2021 (Valid for round trips this date, or for scheduled repetitive trips for 60 days from date signed below.)   Origin: [facility]   Destination:  @PATIENTADDRESS @      Is the Patient's stay covered under Medicare Part A (PPS/DRG?)  [x]   Yes    []  No      Closest appropriate facility?  [x]  Yes    []  No     If no, why was the patient transported to another facility?    If hospital to hospital  transfer, describe services needed at 2nd facility not available at 1st  facility:    If hospice Pt, is this transport related to Pt's terminal illness? [x]   Yes    []  No   Describe: Starting hospice care      SECTION II - MEDICAL NECESSITY QUESTIONNAIRE   Ambulance transportation is medically necessary only if other means of transport are contraindicated or would be potentially    Harmful to the patient. To meet this requirement, the patient must be either bed confined or suffer from condition such    that transport by means other than an ambulance is contraindicated by the patient's condition.The following questions    must be addressed by the healthcare professional signing below for this form to be valid:      1) Describe the MEDICAL CONDITION space (physical and/or mental) of this patient AT THE TIME OF AMBULANCE TRANSPORT        that requires the patient to be transported in an ambulance, and why transport in an ambulance, and why transport by        other means is contraindicated by the patient's condition: Acute heart failure, pulmonary edema, rapid AFIB, non-ambulatory       2) Is this patient bed confined as defined below?  [x]   Yes    []  No            To be bed confined the patient must satisfy all 3 of the following criteria:            (1) unable to get up from bed without assistance; AND            (2) unable to ambulate; AND            (3) unable to sit in a chair or wheelchair.      3) Can this patient safely be transported by car or wheelchair van (I.e., may safely sit during transport,        without an attendant or monitoring?)      []  Yes     []  No      4) In addition to completing questions 1-3 above, please check any of the following conditions that apply*:        *Note: Supporting documentation for any boxes checked must be maintained in the patient's medical records      [] Contractures    [] Non-healed fractures    [] Patient is confused     [] Patient is comatose     [] Moderate/severe pain  on movement    [] Danger to self/others    [] IV meds/fluids required   [] PIV:   []  Yes    []   No   [] Patient is combative  [] Need, or possible need, for restraints    [] DVT requires elevation of a lower extremity    [x] Medical attendant required    [x] Requires oxygen - unable to self-administer    [] Special handling/isolation/infection control precautions  required   [] Unable to tolerate seated position for time needed  to transport    [] Hemodynamic monitoring required enroute     [] Unable to sit in a chair or wheelchair due to decubitus  ulcers or other wounds    [] Cardiac monitoring required enroute    [] Morbid obesity requires additional personnel/equipment  to safely handle patient     [] Orthopedic device (backboard, halo, pins, traction,  brace, wedge, etc.) requiring special handling during transport    [] Other (specificy)       SECTION III - SIGNATURE OF PHYSICIAN OR OTHER AUTHORIZED HEALTHCARE PROFESSIONAL   I certify that  the above information is accurate based on my evaluation of this patient, and that the medical necessity provisions of 42 CFR 410.40 (e)(1) are met, requiring that this patient be transported  by ambulance.  I understand this information will be used by the centers for Medicare and Medicaid Services (CMS) to support the determination of medical necessity for ambulance services.  I represent that I am the beneficiary's attending physician; or  an employee of the beneficiary's attending physician, or the hospital or facility where the beneficiary is being treated and from which the beneficiary is being transported; that I have personal knowledge of the beneficiary's condition at the time of  transport; and that I meet all Medicare regulations and applicable state licensure laws for the credentialed indicated.      [] If this box  is checked, I also certify that the patient is physically or mentally incapable of signing the ambulance services claim form and that the institution with which I am  affiliated has furnished care, services or assistance to the patient.  My signature  below was made on behalf of the patient pursuant to 68 RQM575.63(a)(5).  In accordance with  42 [id number], the specific reason(s) that the patient is physically or mentally incapable of signing the claim form is as follows.        PATIENT DATA:     ED Attending: Katina Odis LABOR, MD       Visit Vitals      BP  123/86     Pulse  (!) 105     Temp  98.7 F (37.1 C)     Resp  30     Ht  5' 3 (1.6 m)     Wt  70.8 kg (156 lb)     SpO2  97%        BMI  27.63 kg/m              Past Medical History:        Diagnosis  Date         ?  Arthritis       ?  Deep venous thrombosis (HCC)       ?  Endocrine disease            hypothyroid         ?  Hemochromatosis           ?  Hypertension            Medications          Medications       furosemide (LASIX) injection 40 mg (40 mg IntraVENous Given 03/12/21 0807)       nitroglycerin (NITROBID) 2 % ointment 1 Inch (1 Inch Topical Given 03/12/21 0807)             Allergies          Allergies        Allergen  Reactions         ?  Shellfish Derived  Hives         ?  Sulfa (Sulfonamide Antibiotics)  Hives             ED Course/Discharge          ED Course as of 03/12/21 1358       Wed Mar 12, 2021        9165  I spoke with son.  He indicates that his mom is ready to die.  It was her last  birthday wish to not see another birthday.  His Dad was under Hospice care  and it was a good experience.  He would like to maximize her comfort, minimize other interventions other than comfort measures, and would be interested in hospice.    [BF]              ED Course User Index   [BF] Katina Odis LABOR, MD                  ICD-10-CM  ICD-9-CM          1.  Atrial fibrillation with rapid ventricular response (HCC)   I48.91  427.31     2.  Acute systolic heart failure (HCC)   I50.21  428.21     3.  Acute pulmonary edema (HCC)   J81.0  518.4     4.  Acute respiratory failure with hypoxia (HCC)   J96.01  518.81           5.  Comfort measures only status   Z51.5  V66.7              Current Discharge Medication List                 START taking these medications          Details        acetaminophen (TYLENOL) 650 mg suppository  Insert 1 Suppository into rectum every six (6) hours as needed for Fever or Pain.   Qty: 4 Suppository, Refills: 0   Start date: 03/12/2021               bisacodyL (DULCOLAX) 10 mg supp  Insert 10 mg into rectum daily as needed for Constipation.   Qty: 2 Suppository, Refills: 0   Start date: 03/12/2021               hyoscyamine SL (LEVSIN/SL) 0.125 mg SL tablet  1 Tablet by SubLINGual route every four (4) hours as needed for Secretions.   Qty: 12 Tablet, Refills: 0   Start date: 03/12/2021               LORazepam (INTENSOL) 2 mg/mL concentrated solution  Take 0.5 mL by mouth every four (4) hours as needed for Agitation or Anxiety (Refractory to non-pharm intervention). Max Daily Amount: 6 mg.   Qty: 30 mL, Refills: 0   Start date: 03/12/2021          Associated Diagnoses: Acute systolic heart failure (HCC)               morphine (ROXANOL) 20 mg/mL concentrated oral syringe  Take 0.25 mL by mouth every two (2) hours as needed (moderate or severe pain or dyspnea) for up to 15 days. Max Daily Amount: 60 mg.   Qty: 30 mL, Refills: 0   Start date: 03/12/2021, End date: 03/27/2021          Associated Diagnoses: Acute systolic heart failure (HCC)               prochlorperazine (COMPAZINE) 10 mg tablet  Take 1 Tablet by mouth every six (6) hours as needed for Nausea or Vomiting for up to 7 days.   Qty: 6 Tablet, Refills: 0   Start date: 03/12/2021, End date: 03/19/2021                        STOP taking these medications  lactulose (CHRONULAC) 10 gram/15 mL solution  Comments:    Reason for Stopping:                      atorvastatin (LIPITOR) 40 mg tablet  Comments:    Reason for Stopping:                      acetaminophen (TYLENOL) 325 mg tablet  Comments:    Reason for Stopping:                       rifAXIMin (XIFAXAN) 550 mg tablet  Comments:    Reason for Stopping:                      magnesium oxide (MAG-OX) 400 mg tablet  Comments:    Reason for Stopping:                      hydroxychloroquine (PLAQUENIL) 200 mg tablet  Comments:    Reason for Stopping:                      levothyroxine (SYNTHROID) 125 mcg tablet  Comments:    Reason for Stopping:                                  Electronically signed by:   Laymon DELENA Hong, RN   March 12, 2021   1:58 PM         Name: __________________________________________________________________   Printed Name and Credentials Authorized Healthcare Professional (MD, DO, RN, etc)   *Form must be signed only by patient's attending physician for scheduled, repetitive transports.   For non-repetitive ambulance transports, if unable to obtain the signature of the attending physician,   Any of the following may sign (please check appropriate box below):         [] Physician Assistant   [] Clinical Nurse Specialist  []  Case Manager  [] Social Worker    [] Nurse Practitioner     [x] Registered Nurse             []  Discharge Planner         Dispatch Phone: [phone]   Dispatch Fax:     [phone]

## 2021-03-12 NOTE — ED Notes (Signed)
Transport came for pt. Son followed them out and has the medications in hand.     No longer primary nurse.

## 2021-03-12 NOTE — Progress Notes (Signed)
Discharge Plan:   [facility] with [facility]     Medications picked up with son and given to nurse    [facility] signed and given to nurse     Discharge Date:     03/12/2021     The Assisted Living Facility confirmed that they are ready to receive the patient:  Lyla Son [phone] from [facility]. Able to accept and aware Hospice SOC will be 03/13/21      Home Health Agency:  [facility]     Confirmed start of care with the home health agency and spoke with:   [name] [phone]    DME needed and ordered for Discharge: Hospice Set-up ordered and arranged per Alcario Drought from [facility]. Per Alcario Drought DME to arrive and set up today at 1700. If change DME will notify     Transportation: Medical    Transport Time:    Per Alcario Drought request after 1700 ED Sec aware and will arrange     Family, Dr. Hyacinth Meeker, Dr. [name], Hospice Agency and facility aware of D/C plan

## 2021-03-12 NOTE — ED Notes (Signed)
Pt son is back, updated case management.  Plan for transport home after 1700 with hospice.

## 2021-03-12 NOTE — ED Provider Notes (Signed)
[facility] Care  Emergency Department Treatment Report        Patient: [name] [name] Age: 87 y.o. Sex: female    Date of Birth: 09-30-1933 Admit Date: 03/12/2021 PCP: [name], MD   MRN: [id number]  CSN: [id number]     Room: ER27/ER27 Time Dictated: 7:21 AM            Chief Complaint   Respiratory distress    History of Present Illness   This is a 87 y.o. female who presents due to shortness of breath.  She lives in a memory care unit at [facility] care.  They noted her to be having difficulty breathing this morning.  Her O2 sats were 87.  She is not typically on oxygen.    Per EMS report she has a history of CHF and atrial fibrillation    They placed her on nasal cannula oxygen and she seemed to improve    No other information is available at this time and she is unable to help with history    Review of Systems   Review of Systems   Unable to perform ROS: Dementia     Past Medical/Surgical History     Past Medical History:   Diagnosis Date    Arthritis     Deep venous thrombosis (HCC)     Endocrine disease     hypothyroid    Hemochromatosis     Hypertension      Past Surgical History:   Procedure Laterality Date    HX ORTHOPAEDIC      left knee replacement     Atrial fibrillation  Social History     Social History     Socioeconomic History    Marital status: WIDOWED     Spouse name: Not on file    Number of children: Not on file    Years of education: Not on file    Highest education level: Not on file   Occupational History    Not on file   Tobacco Use    Smoking status: Never    Smokeless tobacco: Never   Vaping Use    Vaping Use: Never used   Substance and Sexual Activity    Alcohol use: No    Drug use: No    Sexual activity: Not on file   Other Topics Concern    Not on file   Social History Narrative    Not on file     Social Determinants of Health     Financial Resource Strain: Not on file   Food Insecurity: Not on file   Transportation Needs: Not on file   Physical Activity: Not on file    Stress: Not on file   Social Connections: Not on file   Intimate Partner Violence: Not on file   Housing Stability: Not on file         Family History   Unknown and noncontributory  Current Medications     Current Outpatient Medications   Medication Sig Dispense Refill    lactulose (CHRONULAC) 10 gram/15 mL solution Take 30 mL by mouth nightly.      atorvastatin (LIPITOR) 40 mg tablet Take 1 Tablet by mouth nightly.      acetaminophen (TYLENOL) 325 mg tablet Take 2 Tablets by mouth every four (4) hours as needed for Pain or Fever.      rifAXIMin (XIFAXAN) 550 mg tablet Take 1 Tablet by mouth two (2) times a day.  magnesium oxide (MAG-OX) 400 mg tablet Take 400 mg by mouth in the morning.      hydroxychloroquine (PLAQUENIL) 200 mg tablet Take 200 mg by mouth two (2) times a day.      levothyroxine (SYNTHROID) 125 mcg tablet Take 125 mcg by mouth Daily (before breakfast).         Allergies     Allergies   Allergen Reactions    Shellfish Derived Hives    Sulfa (Sulfonamide Antibiotics) Hives       Physical Exam   Patient Vitals for the past 24 hrs:   Temp Pulse Resp BP SpO2   03/12/21 0900 -- (!) 124 (!) 34 115/83 97 %   03/12/21 0755 -- -- -- -- 96 %   03/12/21 0739 -- (!) 126 30 124/84 95 %   03/12/21 0735 98.7 ??F (37.1 ??C) -- -- -- --   03/12/21 0709 -- (!) 134 (!) 34 112/77 98 %     Physical Exam  Constitutional:       General: She is in acute distress.   HENT:      Head: Normocephalic.      Nose: Nose normal.      Mouth/Throat:      Mouth: Mucous membranes are moist.   Eyes:      Conjunctiva/sclera: Conjunctivae normal.   Neck:      Comments: Positive JVD to the angle of the jaw while sitting upright  Cardiovascular:      Rate and Rhythm: Tachycardia present. Rhythm irregular.   Pulmonary:      Effort: Respiratory distress present.      Breath sounds: Rales present.   Abdominal:      Palpations: Abdomen is soft.      Tenderness: There is no abdominal tenderness.   Musculoskeletal:      Cervical back: Normal  range of motion and neck supple.      Right lower leg: Edema present.      Left lower leg: Edema present.   Skin:     General: Skin is dry.   Neurological:      General: No focal deficit present.      Mental Status: She is alert.      Comments: Not oriented but awake and following commands   Psychiatric:      Comments: calm        Impression and Management Plan   Pt in resp distress  Differential diagnoses infectious etiology to include covid or bacterial pneumonia, chf, anemia,     Diagnostic Studies   Lab:   Results for orders placed or performed during the hospital encounter of 03/12/21   [id number]    Specimen: NASOPHARYNGEAL SWAB   Result Value Ref Range    COVID-19 NEGATIVE NEGATIVE      Influenza A PCR POSITIVE (A) NEGATIVE      Influenza B PCR NEGATIVE NEGATIVE      RSV by PCR NEGATIVE NEGATIVE     XR CHEST SNGL V    Narrative    Clinical history: Shortness of breath    EXAMINATION:  Single view of the chest 03/10/2021    FINDINGS:  There is cardiomegaly. Trachea is midline. Atherosclerotic calcification of the  thoracic aorta. Pulmonary interstitial edema and small bilateral pleural  effusions.      Impression    IMPRESSION:  1. Cardiomegaly.  2. Pulmonary edema and small bilateral pleural effusions.    Electronically signed by: Jolayne Panther, MD 03/12/2021 6:54 AM EST  CBC WITH AUTOMATED DIFF   Result Value Ref Range    WBC 10.9 4.0 - 11.0 1000/mm3    RBC 3.63 3.60 - 5.20 M/uL    HGB 11.1 (L) 13.0 - 17.2 gm/dl    HCT 61.4 (L) 43.1 - 50.0 %    MCV 99.2 (H) 80.0 - 98.0 fL    MCH 30.6 25.4 - 34.6 pg    MCHC 30.8 30.0 - 36.0 gm/dl    PLATELET 540 086 - 761 1000/mm3    MPV 10.6 (H) 6.0 - 10.0 fL    RDW-SD 64.7 (H) 36.4 - 46.3      NRBC 0 0 - 0      IMMATURE GRANULOCYTES 0.5 0.0 - 3.0 %    NEUTROPHILS 90.6 (H) 34 - 64 %    LYMPHOCYTES 3.2 (L) 28 - 48 %    MONOCYTES 5.4 1 - 13 %    EOSINOPHILS 0.0 0 - 5 %    BASOPHILS 0.3 0 - 3 %   METABOLIC PANEL, BASIC   Result Value Ref Range    Potassium 3.3 (L) 3.5  - 5.1 mEq/L    Chloride 111 (H) 98 - 107 mEq/L    Sodium 147 (H) 136 - 145 mEq/L    CO2 23 20 - 31 mEq/L    Glucose 127 (H) 74 - 106 mg/dl    BUN 12 9 - 23 mg/dl    Creatinine 9.50 9.32 - 1.02 mg/dl    GFR est AA >67.1      GFR est non-AA >60      Calcium 8.7 8.7 - 10.4 mg/dl    Anion gap 13 5 - 15 mmol/L   TROPONIN-HIGH SENSITIVITY   Result Value Ref Range    Troponin-High Sensitivity 435 (HH) 0 - 34 ng/L   MAGNESIUM   Result Value Ref Range    Magnesium 1.9 1.6 - 2.6 mg/dL   NT-PRO BNP   Result Value Ref Range    NT pro-BNP 9,803 (H) 0 - 450     PROCALCITONIN   Result Value Ref Range    PROCALCITONIN 2.84 (H) 0.00 - 0.50 ng/ml   LACTIC ACID   Result Value Ref Range    Lactic Acid 3.1 (H) 0.5 - 2.2 mmol/L   TROPONIN-HIGH SENSITIVITY   Result Value Ref Range    Troponin-High Sensitivity 707 (HH) 0 - 34 ng/L     Imaging:    XR CHEST SNGL V    Result Date: 03/12/2021  Clinical history: Shortness of breath EXAMINATION: Single view of the chest 03/10/2021 FINDINGS: There is cardiomegaly. Trachea is midline. Atherosclerotic calcification of the thoracic aorta. Pulmonary interstitial edema and small bilateral pleural effusions.     IMPRESSION: 1. Cardiomegaly. 2. Pulmonary edema and small bilateral pleural effusions. Electronically signed by: Jolayne Panther, MD 03/12/2021 6:54 AM EST      EKG: Interpretation is that it shows atrial fibrillation with rapid ventricular response without diagnostic ST or T wave changes    Prehospital EKG: My interpretation is that it shows atrial fibrillation with rapid ventricular response without diagnostic ST or T wave change    Cardiac Monitor Interpretation: Ordered because of respiratory distress.  My interpretation is that shows A. fib with a rate of 124    Imaging: Based on my personal interpretation, the chest x-ray shows pulmonary edema    Other studies:  My interpretation of other studies is that they show, among other things, elevated troponin and BNP consistent with a  type II MI  and acute heart failure    ED Course     ED Course as of 03/12/21 1139   Wed Mar 12, 2021   4696 I spoke with son.  He indicates that his mom is ready to die.  It was her last birthday wish to not see another birthday.  His Dad was under Hospice care and it was a good experience.  He would like to maximize her comfort, minimize other interventions other than comfort measures, and would be interested in hospice.   [BF]      ED Course User Index  [BF] [name], MD        RECORDS REVIEWED:  I reviewed the patient's previous records here at [facility] and available outside facilities and note that she was seen by cardiology in November as an inpatient.    She had an echocardiogram at that time interpreted by Dr. Nash Shearer this was ordered by Dr. Emmit Alexanders  The interpretation was that it showed an EF of 73%    This was compared to an echo from 8/18 and showed no change          INDEPENDENT HISTORIAN:  History and/or plan development assisted by: son and paramedics provided historical context    Severe exacerbation or progression of chronic illness: Severe exacerbation of heart failure with progression of her dementia      Threat to body function without evaluation and management: Cardiovascular      SOCIAL DETERMINANTS  impacting Evaluation and Management: Lives in a nursing home      Comorbidities impacting Evaluation and Management: Dementia impacts the evaluation and response to therapy      Critical Care Time (if necessary) Critical care time excluding procedures, but including direct patient care, reviewing medical records, evaluating results of diagnostic testing, discussions with family members, and consulting with physicians: 90 minutes      Medications   furosemide (LASIX) injection 40 mg (40 mg IntraVENous Given 03/12/21 0807)   nitroglycerin (NITROBID) 2 % ointment 1 Inch (1 Inch Topical Given 03/12/21 0807)           NARRATIVE:  She arrived in respiratory distress with acute hypoxic respiratory failure.  This  was very likely due to rapid A. fib.      I felt that the rapid A. fib was likely due to acute heart failure and that it would settle down with management of the heart failure      She was given IV diuretics.  She was given nitroglycerin.    I reviewed her record and note that she has had a hematoma which has precluded the use of antiplatelets and anticoagulants    I did not anticoagulate her now for that reason    I was unable to speak to the son who indicates that he would like to transition her to comfort measures and hospice    I have spoken with Dr. Gwenith Daily and we discussed the presentation and she said she would come down and see her      Medical Decision Making   I have spoken to Dr. Hyacinth Meeker regarding this patient's care and we discussed she indicates that plans are in the works to get the patient back to her home with hospice care and those plans are ongoing      I continue to check back on the patient and placed her in observation status so that we can control her symptoms, keep her comfortable, continue to  work on final disposition    I considered the following testing, treatment, or disposition:   I considered ICU placement and a diltiazem drip but decided not to pursue due to my conversation with the son regarding goals of care        Final Diagnosis       ICD-10-CM ICD-9-CM   1. Atrial fibrillation with rapid ventricular response (HCC)  I48.91 427.31   2. Acute systolic heart failure (HCC)  Z61.09 428.21   3. Acute pulmonary edema (HCC)  J81.0 518.4   4. Acute respiratory failure with hypoxia (HCC)  J96.01 518.81   5. Comfort measures only status  Z51.5 V66.7       Disposition   Pending    Chinmayi Rumer A. [name], MD F-ACEP  March 12, 2021    My signature above authenticates this document and my orders, the final    diagnosis (es), discharge prescription (s), and instructions in the Epic    record.  If you have any questions please contact [phone].     Nursing notes have been reviewed by the  physician/ advanced practice    Clinician.

## 2021-03-12 NOTE — Consults (Signed)
Consults  by [name] Ores, MD at 03/12/21 1442                Author: [name], MD  Service: Palliative Medicine  Author Type: Physician       Filed: 03/12/21 1533  Date of Service: 03/12/21 1442  Status: Signed          Editor: [name] Ores, MD (Physician)            Consult Orders        1. IP CONSULT TO PALLIATIVE CARE - PROVIDER [[id number]] ordered by [name] Pellant, MD at 03/12/21 [phone]                              Palliative Care Consult      The Palliative Medicine Team is available Monday to Friday from 8 am to 4:30 pm at [phone]- there is no provider available on call after hours      NAME:  [name] [name]    DOB:   04-24-1933    MRN:   [id number]       Date/Time:  03/12/2021 2:42 PM      PC Consulted by: Dr. Carmela [name] on 12/28 for discussion of GOC, options for care, code status, symptom management needs and disposition for patient   Primary Care Physician: [name] Bridegroom, MD    Attending Physician: [name] Pellant, MD          Palliative Assessment/Plan:        Health Care Decision Maker is: All 3 children as no AD- however eldest son [name] [name] [phone] is family spokesperson and his  siblings defer to him as he is primary caretaker as well. This is confirmed by PCP      Code Status:  DNR w/DDNR completed by son [name] [name] today 03/12/21 to transport with patient      Goals of Care: Patient without capacity.  Dr. Carmela [name] in ED as well as I (with SW [name] [name]) had long conversation with son  bedside.  He confirmed that his mother had been ready to die for almost 2 years.  A "tough bird", but her chronic illnesses have really taken a toll and she has less and less quality of life particularly since her stroke.  Son wishes to focus on comfort  and would like Hospice to support this goal.  He had already been talking to the administrator at her ALF, [name] [name] at [place] and wishes her to return there with Hospice.      ? DNR with DDNR  completed   ? CMO   ? Hospice at discharge      With her diagnosis of  HFpEF with recent CVA with resultant decline, poor functional status with continual severe nutritional decline along with advanced age, in my medical judgment, [name] [name] is   is appropriate for hospice with a life expectancy of 6 months or less if this terminal illness runs its normal course based on these and other findings documented in the medical record.        However, she DOES NOT meet criteria for General Inpatient Hospice at this time as has symptoms that could be managed well at a lower level  of care.      Disposition: TO return to [name] [name] at [facility], her home, with [facility] (Facility requested [facility] [place] but they cannot  open her) so  CM [name] [name] explored the other agency facility recommended.      Palliative Care Problem List:        ??  Acute on Chronic HFpEF of    o  EF 73% on 02/07/21 echo   o  IV Lasix in ED with good response/UOP      ??  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 Allison [name] 02/06/21 2/2 AF   o  Functional decline in SNF since      ??  Atrial Fibrillation, since 10/2020,  no further AC as not c/w GOC      ??  Small SDH after fall 2019      ??  Cirrhosis of Liver      ??  Lewy Body Dementia w/periodic hallucinations      ??  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  Ataxia with recurrent Falls       ??  Dysphagia         ??  Medical Debility with 2 hospitalizations past 4 months including       ??  Significant comorbidities/PMH include: HTN, Hypothyroidism, Peripheral Neuropathy contributing  to falls, , OSA, Chronic LLE DVT, Hereditary Hemochromatosis with q 6 month phlebotomy as per [name] [name] , Right Hip replacement 2008, Left Total Hip replacement 2014 with subsequent FX s/p ORIF and replacement of hip 2017 in [place], Rheumatoid Arthritis  x 5 years Methotrexate stopped ~09/2020 when Cirrhosis found with Plaquenil continued      Current capacity for  decision making:    []  Full Capacity   [x]  No Capacity due to underlying Dementia, acute  encephalopathy   []  Waxing and Waning Capacity due to       Advance Care Planning:   [facility] Interdisciplinary Team has updated the ACP Navigator with Health Care Agent and any associated documents.      Advance Directives: None known           Medical Power of Attorney: None known           Living Will: None known            POST forms:       No Advance Directive: Surrogate Decision Maker:       Comments:       The following were updated:   Discussed with Attending Physician/Provider: , MD    Other Physician (consultant): reviewed chart notes Dr.   Nursing    Care Manager/Discharge Planner: [facility]       Thank you for allowing [name] [name] to participate in the care of [name] [name] and her family.      [name] [name], [name] 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 Katrinka [name] 02/06/21, in setting of Atrial Fibrillation, since 10/2020,   Small SDH after fall 2019, HFpEF of 73% on 02/07/21 echo, Mild Lewy Body Dementia w/periodic hallucinations,  Reecurrent Hepatic Encephalopathy, Ataxia with recurrent Falls, Peripheral Neuropathy, OSA, Chronic LLE DVT, Hereditary Hemochromatosis with   q 6 month phlebotomy as per Dr. 11/2020 , Right  Hip replacement Jul 29, 2006, Left Total Hip replacement 07/28/2012 with subsequent FX s/p ORIF and replacement of hip 07/29/2015 in [place], Rheumatoid Arthritis x 5 years Methotrexate stopped ~09/2020 when Cirrhosis of Liver  found with Plaquenil continued.  HTN, Hypothyroidism,       Medical Debility with 2 hospitalizations past 4 months including 8/8-8/31/22 with COVID-19 Pneumonitis and Aspiration PNA            Social History:     Palliative  Care Social History: Pt is widowed since Jul 28, 2013 when husband died of Cancer in Hospice.  Lived in home for 2 more years  then a condo and them last couple years has been in ALF [place] [facility] as could no longer care for herself without help.  Has 3 children - 2 sons who live locally and a daughter who lives out of the area.  Son at bedside, [name] [name], is spokesperson  for the family as he is most involved in patient's care.       Insurance:       Health and safety inspector                                       [place] MED[place]ARE/[facility] MEDICARE A & B  Phone:  --             Subscriber:  [name] [name], [name] [name]  Subscriber#:  [id number]       Group#:  --  Precert#:  --                      [employer]/[facility] AARP SUPPLEMENTAL  Phone:  --             Subscriber:  [name] [name], [name] [name]  Subscriber#:  [id number]             Group#:  PLAN [id number]  Precert#:  --                  Patient Address: [address]   [place] [address]    Religious/Cultural/Spiritual: CATHOLIC        Review of Systems:        Unable to obtain due to clinical circumstance.Does say abdomen hurts, no answer to SOB?        Functional Assessment:     FAST Scale in Dementia: 6e      Palliative Performance Scale:  20%   Prior Palliative Performance Scale: 30%      Palliative Performance Scale:             %  Ambulation  Activity level   Evidence of disease  Self-Care  Intake  Level of   Conscious            100  Full  Normal   No evidence of disease  Full  Normal  Full     90  Full  Normal   Some disease  Full  Normal  Full     80  Full  Normal with effort   Some disease  Full  Normal or reduced  Full     70  Reduced  Can't perform normal job/work   Some disease  Full  Normal or reduced  Full     60  Reduced  Can't perform hobbies or housework   Significant disease  Occasional assistance needed  Normal or reduced  Full or  confusion     50  Mainly sits/lie  Can't perform any work   Extensive disease  Considerable assistance  Normal or reduced  Full or    confusion     40  Mainly lie  As above  Mainly assistance  Normal or reduced  Full or Drowsy   or Confusion     30  Bed Bound  As above  Total care  Reduced  As above     20  Bed Bound  As above  Total care  Minimal  As above     10  Bed Bound  As above  Total care  Mouth care only  Drowsy or Coma            0  Death                                            Physical Exam:        Visit Vitals      BP  123/86     Pulse  (!) 105     Temp  98.7 ??F (37.1 ??C)     Resp  30     Ht  5\' 3"  (1.6 m)     Wt  70.8 kg (156 lb)     SpO2  97%        BMI  27.63 kg/m??        Body mass index is 27.63 kg/m??. Body mass index is 27.63 kg/m??.     Wt Readings from Last 3 Encounters:        03/12/21  70.8 kg (156 lb)     02/11/21  71.7 kg (158 lb 1.1 oz)        11/11/20  73 kg (160 lb 15 oz)             General  Appearance: Acutely and chronically ill appearing woman, supine, some groaning and grimacing   HEENT: Temporal Wasting, Nasal Cannula in place.     Lungs:  Coarse Breath Sounds Bilaterally, Diminished breath sounds at bases,  upper airway secretions heard   Cardiovascular:  S1 S2,Tachycardia   Abdomen: Soft, mild TTP mid abdomen diffusely   GU: Bladder not palpable. External foley in place.   Neurological:  As above   Extremities: No edema, no cyanosis   Psych: Depressed Affect   Skin:  Warm, perfused,   Lines: PIV         Total time spent on E/M 87 minutes of which more than 50% was spent in coordination of care and counseling (time spent with patient/family face to face, physical exam, reviewing laboratory and imaging investigations, speaking with physicians and nursing  staff involved in this patient's care).      Total time spent on ACP 0 minutes of which more than 50% was spent in coordination of care and counseling (time spent with patient/family face to face, physical exam, reviewing laboratory and imaging investigations, speaking with physicians and nursing  staff involved in this patient's care)

## 2021-04-16 DEATH — deceased
